# Patient Record
Sex: Male | Born: 1940 | Race: Black or African American | Hispanic: No
Health system: Midwestern US, Academic
[De-identification: ages and names within clinical notes are randomized; demographics above are authoritative.]

## PROBLEM LIST (undated history)

## (undated) DIAGNOSIS — Q231 Congenital insufficiency of aortic valve: Secondary | ICD-10-CM

## (undated) DIAGNOSIS — Z972 Presence of dental prosthetic device (complete) (partial): Secondary | ICD-10-CM

## (undated) DIAGNOSIS — C3492 Malignant neoplasm of unspecified part of left bronchus or lung: Secondary | ICD-10-CM

## (undated) DIAGNOSIS — R011 Cardiac murmur, unspecified: Secondary | ICD-10-CM

## (undated) DIAGNOSIS — E785 Hyperlipidemia, unspecified: Secondary | ICD-10-CM

## (undated) DIAGNOSIS — K219 Gastro-esophageal reflux disease without esophagitis: Secondary | ICD-10-CM

## (undated) DIAGNOSIS — D649 Anemia, unspecified: Secondary | ICD-10-CM

## (undated) DIAGNOSIS — Q230 Congenital stenosis of aortic valve: Secondary | ICD-10-CM

## (undated) DIAGNOSIS — H409 Unspecified glaucoma: Secondary | ICD-10-CM

## (undated) DIAGNOSIS — K922 Gastrointestinal hemorrhage, unspecified: Secondary | ICD-10-CM

## (undated) DIAGNOSIS — I10 Essential (primary) hypertension: Secondary | ICD-10-CM

## (undated) DIAGNOSIS — I509 Heart failure, unspecified: Secondary | ICD-10-CM

## (undated) DIAGNOSIS — H269 Unspecified cataract: Secondary | ICD-10-CM

## (undated) DIAGNOSIS — Z8679 Personal history of other diseases of the circulatory system: Secondary | ICD-10-CM

## (undated) DIAGNOSIS — K08109 Complete loss of teeth, unspecified cause, unspecified class: Secondary | ICD-10-CM

## (undated) DIAGNOSIS — I4510 Unspecified right bundle-branch block: Secondary | ICD-10-CM

## (undated) DIAGNOSIS — D573 Sickle-cell trait: Secondary | ICD-10-CM

## (undated) DIAGNOSIS — R972 Elevated prostate specific antigen [PSA]: Secondary | ICD-10-CM

## (undated) HISTORY — DX: Essential (primary) hypertension: I10

## (undated) HISTORY — DX: Hyperlipidemia, unspecified: E78.5

## (undated) HISTORY — PX: PROSTATE BIOPSY: SHX241

## (undated) HISTORY — DX: Unspecified cataract: H26.9

## (undated) HISTORY — PX: OTHER SURGICAL HISTORY: SHX169

## (undated) HISTORY — DX: Complete loss of teeth, unspecified cause, unspecified class: K08.109

## (undated) HISTORY — DX: Malignant neoplasm of unspecified part of left bronchus or lung: C34.92

## (undated) HISTORY — DX: Presence of dental prosthetic device (complete) (partial): Z97.2

## (undated) HISTORY — DX: Personal history of other diseases of the circulatory system: Z86.79

## (undated) HISTORY — PX: CATARACT EXTRACTION: SUR2

## (undated) HISTORY — PX: TONSILLECTOMY: SUR1361

## (undated) HISTORY — DX: Anemia, unspecified: D64.9

## (undated) HISTORY — DX: Sickle-cell trait: D57.3

## (undated) HISTORY — DX: Congenital insufficiency of aortic valve: Q23.1

## (undated) HISTORY — DX: Gastrointestinal hemorrhage, unspecified: K92.2

## (undated) HISTORY — DX: Unspecified glaucoma: H40.9

## (undated) HISTORY — DX: Congenital stenosis of aortic valve: Q23.0

## (undated) HISTORY — DX: Unspecified right bundle-branch block: I45.10

## (undated) HISTORY — DX: Cardiac murmur, unspecified: R01.1

## (undated) HISTORY — DX: Elevated prostate specific antigen (PSA): R97.20

## (undated) LAB — PREPARE RBC, LEUKOREDUCED
Blood Expiration Date: 202408132359
Dispense Status: TRANSFUSED

---

## 2001-04-28 ENCOUNTER — Emergency Department (HOSPITAL_COMMUNITY): Admission: EM | Admit: 2001-04-28 | Discharge: 2001-04-28 | Payer: Self-pay | Admitting: Emergency Medicine

## 2001-04-28 ENCOUNTER — Encounter: Payer: Self-pay | Admitting: Emergency Medicine

## 2001-05-12 ENCOUNTER — Ambulatory Visit (HOSPITAL_COMMUNITY): Admission: RE | Admit: 2001-05-12 | Discharge: 2001-05-12 | Payer: Self-pay | Admitting: Family Medicine

## 2002-10-29 ENCOUNTER — Encounter: Admission: RE | Admit: 2002-10-29 | Discharge: 2002-10-29 | Payer: Self-pay | Admitting: Otolaryngology

## 2002-10-29 ENCOUNTER — Encounter: Payer: Self-pay | Admitting: Otolaryngology

## 2002-10-30 ENCOUNTER — Encounter (INDEPENDENT_AMBULATORY_CARE_PROVIDER_SITE_OTHER): Payer: Self-pay | Admitting: Otolaryngology

## 2002-10-30 ENCOUNTER — Ambulatory Visit (HOSPITAL_COMMUNITY): Admission: RE | Admit: 2002-10-30 | Discharge: 2002-10-30 | Payer: Self-pay | Admitting: Otolaryngology

## 2002-10-30 ENCOUNTER — Ambulatory Visit (HOSPITAL_BASED_OUTPATIENT_CLINIC_OR_DEPARTMENT_OTHER): Admission: RE | Admit: 2002-10-30 | Discharge: 2002-10-30 | Payer: Self-pay | Admitting: Otolaryngology

## 2004-10-08 ENCOUNTER — Ambulatory Visit: Payer: Self-pay | Admitting: Internal Medicine

## 2004-10-20 ENCOUNTER — Encounter (INDEPENDENT_AMBULATORY_CARE_PROVIDER_SITE_OTHER): Payer: Self-pay | Admitting: Specialist

## 2004-10-20 ENCOUNTER — Ambulatory Visit: Payer: Self-pay | Admitting: Internal Medicine

## 2007-10-30 ENCOUNTER — Ambulatory Visit (HOSPITAL_COMMUNITY): Admission: RE | Admit: 2007-10-30 | Discharge: 2007-10-30 | Payer: Self-pay | Admitting: Family Medicine

## 2008-04-29 ENCOUNTER — Encounter (INDEPENDENT_AMBULATORY_CARE_PROVIDER_SITE_OTHER): Payer: Self-pay | Admitting: *Deleted

## 2009-11-04 ENCOUNTER — Encounter: Payer: Self-pay | Admitting: Internal Medicine

## 2009-11-18 ENCOUNTER — Telehealth: Payer: Self-pay | Admitting: Internal Medicine

## 2009-11-18 HISTORY — PX: COLONOSCOPY: SHX174

## 2009-11-19 ENCOUNTER — Encounter (INDEPENDENT_AMBULATORY_CARE_PROVIDER_SITE_OTHER): Payer: Self-pay | Admitting: *Deleted

## 2009-11-19 ENCOUNTER — Encounter: Payer: Self-pay | Admitting: Internal Medicine

## 2009-11-20 ENCOUNTER — Ambulatory Visit: Payer: Self-pay | Admitting: Internal Medicine

## 2009-11-27 ENCOUNTER — Ambulatory Visit: Payer: Self-pay | Admitting: Internal Medicine

## 2009-12-01 ENCOUNTER — Encounter: Payer: Self-pay | Admitting: Internal Medicine

## 2010-02-19 NOTE — Procedures (Signed)
Summary: Colonoscopy  Patient: [name] Note: All result statuses are Final unless otherwise noted.  Tests: (1) Colonoscopy (COL)   COL Colonoscopy           [facility]     [place], [place]  [address]           COLONOSCOPY PROCEDURE REPORT           PATIENT:  Andrew Guerra, Andrew Guerra  MR#:  [id number]     BIRTHDATE:  February 10, 1940, 69 yrs. old  GENDER:  male     ENDOSCOPIST:  Docia Chuck. Geri Seminole, MD     REF. 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  Digital rectal exam was performed and     revealed no abnormalities.   The LB 180AL [id number] endoscope was     introduced through the anus and advanced to the cecum, which was     identified by both the appendix and ileocecal valve, without     limitations.Time to the cecum = 3:38 min. The quality of the prep     was excellent, using MoviPrep.  The instrument was then slowly     withdrawn (time = 11:18 min) as the colon was fully examined.     <<PROCEDUREIMAGES>>           FINDINGS:  A diminutive polyp was found in the proximal transverse     colon. Polyp was snared without cautery. Retrieval was successful.     snare polyp  This was otherwise a normal examination of the colon.     Retroflexed views in the rectum revealed hypertrophied anal     papillae.    The scope was then withdrawn from the patient and the     procedure completed.           COMPLICATIONS:  None     ENDOSCOPIC IMPRESSION:     1) Diminutive polyp in the proximal transverse colon     2) Otherwise normal examination    3) Hypertrophied anal papillae           RECOMMENDATIONS:     1) Repeat colonoscopy in 5 years if polyp adenomatous; otherwise     10 years           ______________________________     Docia Chuck. Geri Seminole, MD           CC:  Sallee Lange, MD; The Patient           n.     eSIGNED:   Docia Chuck. Geri Seminole at 11/27/2009 01:06 PM           Andrew Guerra, [id number]  Note: An exclamation mark (!) indicates a result that was not dispersed into the flowsheet. Document Creation Date: 11/27/2009 1:07 PM _______________________________________________________________________  (1) Order result status: Final Collection  or observation date-time: 11/27/2009 13:00 Requested date-time:  Receipt date-time:  Reported date-time:  Referring Physician:   Ordering Physician: Lavena Bullion [phone]) Specimen Source:  Source: Tawanna Cooler Order Number: [id number] Lab site:   Appended Document: Colonoscopy     Procedures Next Due Date:    Colonoscopy: 11/2014

## 2010-02-19 NOTE — Letter (Signed)
Summary: [facility] Instructions  [facility]  [place], [place] [address]   Phone: [phone]  Fax: [phone]       PILAR DERITA    1940/11/27    MRN: [id number]        Procedure Day Sudie Grumbling:  Raquel Sarna  72/10/11     Arrival Time:  10:30AM     Procedure Time:  11:30AM     Location of Procedure:                    _X _  [facility] (4th Floor)                     [place]   Starting 5 days prior to your procedure 11/22/09 do not eat nuts, seeds, popcorn, corn, beans, peas,  salads, or any raw vegetables.  Do not take any fiber supplements (e.g. Metamucil, Citrucel, and Benefiber).  THE DAY BEFORE YOUR PROCEDURE         DATE: 72/9/11  DAY: WEDNESDAY  1.  Drink clear liquids the entire day-NO SOLID FOOD  2.  Do not drink anything colored red or purple.  Avoid juices with pulp.  No orange juice.  3.  Drink at least 64 oz. (8 glasses) of fluid/clear liquids during the day to prevent dehydration and help the prep work efficiently.  CLEAR LIQUIDS INCLUDE: Water Jello Ice Popsicles Tea (sugar ok, no milk/cream) Powdered fruit flavored drinks Coffee (sugar ok, no milk/cream) Gatorade Juice: apple, white grape, white cranberry  Lemonade Clear bullion, consomm, broth Carbonated beverages (any kind) Strained chicken noodle soup Hard Candy                             4.  In the morning, mix first dose of MoviPrep solution:    Empty 1 Pouch A and 1 Pouch B into the disposable container    Add lukewarm drinking water to the top line of the container. Mix to dissolve    Refrigerate (mixed solution should be used within 24 hrs)  5.  Begin drinking the prep at 5:00 p.m. The MoviPrep container is divided by 4 marks.   Every 15 minutes drink the solution down to the next mark (approximately 8 oz) until the full liter is complete.   6.  Follow completed prep with 16 oz of clear liquid of your choice  (Nothing red or purple).  Continue to drink clear liquids until bedtime.  7.  Before going to bed, mix second dose of MoviPrep solution:    Empty 1 Pouch A and 1 Pouch B into the disposable container    Add lukewarm drinking water to the top line of the container. Mix to dissolve    Refrigerate  THE DAY OF YOUR PROCEDURE      DATE: 72/10/11   DAY: THURSDAY  Beginning at 6:30AM (5 hours before procedure):         1. Every 15 minutes, drink the solution down to the next mark (approx 8 oz) until the full liter is complete.  2. Follow completed prep with 16 oz. of clear liquid of your choice.    3. You may drink clear liquids until 9:30AM (2 HOURS BEFORE PROCEDURE).   MEDICATION INSTRUCTIONS  Unless otherwise instructed, you should take regular prescription medications with a small sip of water   as early as possible the morning of your  procedure.         OTHER INSTRUCTIONS  You will need a responsible adult at least 72 years of age to accompany you and drive you home.   This person must remain in the waiting room during your procedure.  Wear loose fitting clothing that is easily removed.  Leave jewelry and other valuables at home.  However, you may wish to bring a book to read or  an iPod/MP3 player to listen to music as you wait for your procedure to start.  Remove all body piercing jewelry and leave at home.  Total time from sign-in until discharge is approximately 2-3 hours.  You should go home directly after your procedure and rest.  You can resume normal activities the  day after your procedure.  The day of your procedure you should not:   Drive   Make legal decisions   Operate machinery   Drink alcohol   Return to work  You will receive specific instructions about eating, activities and medications before you leave.    The above instructions have been reviewed and explained to me by   Ernestine Conrad, RN______________________    I fully  understand and can verbalize these instructions _____________________________ Date _________

## 2010-02-19 NOTE — Letter (Signed)
Summary: Patient Notice- Polyp Results  [facility]  [address] [place], [place] [address]   Phone: [phone]  Fax: [phone]        December 01, 2009 MRN: [id number]    Andrew Guerra [address] [place], [place]  [address]    Dear [name],  I am pleased to inform you that the colon polyp(s) removed during your recent colonoscopy was (were) found to be benign (no cancer detected) upon pathologic examination.  I recommend you have a repeat colonoscopy examination in 5 years to look for recurrent polyps, as having colon polyps increases your risk for having recurrent polyps or even colon cancer in the future.  Should you develop new or worsening symptoms of abdominal pain, bowel habit changes or bleeding from the rectum or bowels, please schedule an evaluation with either your primary care physician or with me.  Additional information/recommendations:  __ No further action with gastroenterology is needed at this time. Please      follow-up with your primary care physician for your other healthcare      needs.    Please call us if you are having persistent problems or have questions about your condition that have not been fully answered at this time.  Sincerely,  Irene Shipper MD  This letter has been electronically signed by your physician.  Appended Document: Patient Notice- Polyp Results letter mailed

## 2010-02-19 NOTE — Progress Notes (Signed)
  Phone Note Outgoing Call   Call placed by: Randye Lobo NCMA,  November 18, 2009 11:01 AM Call placed to: Patient Summary of Call: Called patient to make Colonoscopy appt.  Left msg. on voicemail for patient to call back and schedule.   Initial call taken by: Randye Lobo NCMA,  November 18, 2009 11:02 AM  Follow-up for Phone Call        called patient and set up recall colonoscopy for 11/27/09 and pre-visit 11/20/09  1:00.   Follow-up by: Randye Lobo NCMA,  November 19, 2009 2:14 PM

## 2010-02-19 NOTE — Miscellaneous (Signed)
Summary: recall colonoscopy 11/27/09  11:30 am/bs  Clinical Lists Changes  Medications: Added new medication of MOVIPREP 100 GM  SOLR (PEG-KCL-NACL-NASULF-NA ASC-C) As directed - Signed Rx of MOVIPREP 100 GM  SOLR (PEG-KCL-NACL-NASULF-NA ASC-C) As directed;  #1 x 0;  Signed;  Entered by: Ernestine Conrad RN;  Authorized by: Irene Shipper MD;  Method used: Electronically to [facility] [phone]*, [address], [place], [place]  [address], Ph: [phone] or [phone], Fax: [phone] Observations: Added new observation of ALLERGY REV: Done (11/20/2009 12:44)    Prescriptions: MOVIPREP 100 GM  SOLR (PEG-KCL-NACL-NASULF-NA ASC-C) As directed  #1 x 0   Entered by:   Ernestine Conrad RN   Authorized by:   Irene Shipper MD   Signed by:   Ernestine Conrad RN on 11/20/2009   Method used:   Electronically to        [facility] (retail)       [address]       [place], [place]  [address]       Ph: [phone] or [phone]       Fax: [phone]   RxID:   [id number]

## 2010-02-19 NOTE — Letter (Signed)
Summary: [facility]   Imported By: Bubba Hales 12/03/2009 10:11:35  _____________________________________________________________________  External Attachment:    Type:   Image     Comment:   External Document

## 2010-02-19 NOTE — Letter (Signed)
Summary: Pre Visit Letter Revised  [facility]  [place], [place] [address]   Phone: [phone]  Fax: [phone]        11/19/2009 MRN: [id number] Andrew Guerra [place], [place]  [address]             Procedure Date:  11/27/09  Welcome to the Gastroenterology Division at [facility].    You are scheduled to see a nurse for your pre-procedure visit on 11/20/09 at 1:00 pm on the 3rd floor at [facility], [place] [employer].  We ask that you try to arrive at our office 15 minutes prior to your appointment time to allow for check-in.  Please take a minute to review the attached form.  If you answer "Yes" to one or more of the questions on the first page, we ask that you call the person listed at your earliest opportunity.  If you answer "No" to all of the questions, please complete the rest of the form and bring it to your appointment.    Your nurse visit will consist of discussing your medical and surgical history, your immediate family medical history, and your medications.   If you are unable to list all of your medications on the form, please bring the medication bottles to your appointment and we will list them.  We will need to be aware of both prescribed and over the counter drugs.  We will need to know exact dosage information as well.    Please be prepared to read and sign documents such as consent forms, a financial agreement, and acknowledgement forms.  If necessary, and with your consent, a friend or relative is welcome to sit-in on the nurse visit with you.  Please bring your insurance card so that we may make a copy of it.  If your insurance requires a referral to see a specialist, please bring your referral form from your primary care physician.  No co-pay is required for this nurse visit.     If you cannot keep your appointment, please call [phone] to cancel or reschedule prior to your appointment date.  This allows [name]  the opportunity to schedule an appointment for another patient in need of care.    Thank you for choosing [facility] for your medical needs.  We appreciate the opportunity to care for you.  Please visit [name] at our website  to learn more about our practice.  Sincerely, The Gastroenterology Division

## 2010-06-05 NOTE — Op Note (Signed)
   NAME:  Andrew Guerra, Andrew Guerra NO.:  [id number]   MEDICAL RECORD NO.:  [id number]                   PATIENT TYPE:  AMB   LOCATION:  [facility]                                  FACILITY:  [facility]   PHYSICIAN:  Minna Merritts, M.D.                DATE OF BIRTH:  03-Feb-1940   DATE OF PROCEDURE:  10/30/2002  DATE OF DISCHARGE:                                 OPERATIVE REPORT   PREOPERATIVE DIAGNOSIS:  Rule out basal cell carcinoma with associated  sebaceous cyst of left lateral eyelid, lower.   POSTOPERATIVE DIAGNOSIS:  Rule out basal cell carcinoma with associated  sebaceous cyst of left lateral eyelid, lower.   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   SURGEON:  Minna Merritts, M.D.   ANESTHESIA:  MAC with local.  1% Xylocaine with epinephrine.   DESCRIPTION OF PROCEDURE:  The patient was placed in supine position and  after prepping and local anesthesia was instilled, the marking pen was used  and an elliptical incision was made and this area of concern on the skin was  completely removed and will be sent for pathology.  We then dissected a very  large sebaceous cyst from this area measuring approximately 2 x 1 cm in size  and involving the two areas of skin, one at the area of excision and one  inferior to this and this was completely excised and Bovie  electrocoagulation was used for hemostasis.  Once this was incised and all  hemostasis was controlled, the closure was with 5-0 plain catgut interrupted  stitches.  Steri-Strips were applied.  The patient tolerated the procedure  well and is doing well postoperatively.  Follow-up will be in 10 days, then  three weeks.                                                Minna Merritts, M.D.    JC/MEDQ  D:  10/30/2002  T:  10/30/2002  Job:  [id number]   cc:   Nicki Reaper A. Wolfgang Phoenix, M.D.  [address] [place]  [place]  [address]  Fax: [phone]

## 2010-06-05 NOTE — Procedures (Signed)
[facility]  Patient:    Andrew Guerra, Andrew Guerra Visit Number: [id number] MRN: [id number]          Service Type: OUT Location: RAD Attending Physician:  Sallee Lange Dictated by:   Sallee Lange, M.D. Admit Date:  05/12/2001                                Stress Test  PROTOCOL:  Bruce.  INDICATIONS:  Chest discomfort along with risk factors.  RESTING EKG:  No acute ST segment changes.  Normal sinus rhythm.  Baseline blood pressure mildly elevated 162/96.  HEART RATE RESPONSE TO EXERCISE:  Patient went into stage II approximately seven minutes when he had to stop because of shortness of breath.  SYMPTOMATOLOGY:  Just shortness of breath.  ST SEGMENT RESPONSE TO EXERCISE:  Patient did not have any appreciable ST segment depression or inversion.  RECOVERY PHASE:  Uneventful.  BLOOD PRESSURE RESPONSE TO EXERCISE:  Patient did have a hypertensive response to exercise.  INTERPRETATION:  Negative stress test.  Poor exercise tolerance.  Patient was encouraged to quit smoking.  Encouraged to gradually pick up a walking program.  We will plan on doing a pulmonary function test as part of his outpatient work-up when he comes in for his physical. Dictated by:   Sallee Lange, M.D. Attending Physician:  Sallee Lange DD:  05/12/01 TD:  05/12/01 Job: [id number] [id number]

## 2012-05-17 ENCOUNTER — Encounter: Payer: Self-pay | Admitting: *Deleted

## 2012-05-19 ENCOUNTER — Other Ambulatory Visit: Payer: Self-pay | Admitting: Urology

## 2012-05-19 DIAGNOSIS — R972 Elevated prostate specific antigen [PSA]: Secondary | ICD-10-CM

## 2012-05-30 ENCOUNTER — Other Ambulatory Visit: Payer: Self-pay | Admitting: Urology

## 2012-05-30 ENCOUNTER — Other Ambulatory Visit (HOSPITAL_COMMUNITY): Payer: Medicare Other

## 2012-05-30 ENCOUNTER — Ambulatory Visit (HOSPITAL_COMMUNITY)
Admission: RE | Admit: 2012-05-30 | Discharge: 2012-05-30 | Disposition: A | Discharge status: Home / Self Care | Payer: Medicare Other | Source: Ambulatory Visit | Attending: Urology | Admitting: Urology

## 2012-05-30 ENCOUNTER — Ambulatory Visit (HOSPITAL_COMMUNITY): Admission: RE | Admit: 2012-05-30 | Payer: Medicare Other | Source: Ambulatory Visit

## 2012-05-30 DIAGNOSIS — R972 Elevated prostate specific antigen [PSA]: Secondary | ICD-10-CM

## 2012-05-30 DIAGNOSIS — IMO0002 Reserved for concepts with insufficient information to code with codable children: Secondary | ICD-10-CM

## 2012-05-30 DIAGNOSIS — Z1389 Encounter for screening for other disorder: Secondary | ICD-10-CM | POA: Insufficient documentation

## 2012-05-30 LAB — CREATININE, SERUM
Creatinine, Ser: 1.3 mg/dL (ref 0.50–1.35)
GFR calc Af Amer: 61 mL/min — ABNORMAL LOW (ref >90)
GFR calc non Af Amer: 53 mL/min — ABNORMAL LOW (ref >90)

## 2012-05-30 MED ORDER — GADOBENATE DIMEGLUMINE 529 MG/ML IV SOLN: 20.0000 mL | Freq: Once | INTRAVENOUS | Status: DC | PRN

## 2012-05-30 MED ORDER — GADOBENATE DIMEGLUMINE 529 MG/ML IV SOLN
20.0000 mL | Freq: Once | INTRAVENOUS | Status: AC | PRN
  Administered 2012-05-30: 20 mL via INTRAVENOUS

## 2012-06-06 ENCOUNTER — Ambulatory Visit (INDEPENDENT_AMBULATORY_CARE_PROVIDER_SITE_OTHER): Payer: Medicare Other | Admitting: Family Medicine

## 2012-06-06 ENCOUNTER — Encounter: Payer: Self-pay | Admitting: Family Medicine

## 2012-06-06 VITALS — BP 138/82 | HR 70 | Wt 215.0 lb

## 2012-06-06 DIAGNOSIS — I10 Essential (primary) hypertension: Secondary | ICD-10-CM

## 2012-06-06 MED ORDER — HYDROCHLOROTHIAZIDE 25 MG PO TABS: 25.0000 mg | ORAL_TABLET | Freq: Every day | ORAL | Status: DC

## 2012-06-06 MED ORDER — METOPROLOL SUCCINATE ER 50 MG PO TB24: 50.0000 mg | ORAL_TABLET | Freq: Every day | ORAL | Status: DC

## 2012-06-06 MED ORDER — AMLODIPINE BESYLATE 10 MG PO TABS: 10.0000 mg | ORAL_TABLET | Freq: Every day | ORAL | Status: DC

## 2012-06-06 NOTE — Progress Notes (Signed)
  Subjective:    Patient ID: Andrew Guerra, male    DOB: 08/14/1940, 72 y.o.   MRN: [id number]  Hypertension This is a chronic problem. The current episode started more than 1 year ago. The problem is controlled.      Review of Systems     Objective:   Physical Exam 116/70       Assessment & Plan:  HTN PE fall

## 2012-06-08 DIAGNOSIS — I10 Essential (primary) hypertension: Secondary | ICD-10-CM | POA: Insufficient documentation

## 2012-06-08 HISTORY — DX: Essential (primary) hypertension: I10

## 2012-12-03 ENCOUNTER — Other Ambulatory Visit: Payer: Self-pay | Admitting: Family Medicine

## 2012-12-04 ENCOUNTER — Telehealth: Payer: Self-pay | Admitting: Family Medicine

## 2012-12-04 DIAGNOSIS — Z125 Encounter for screening for malignant neoplasm of prostate: Secondary | ICD-10-CM

## 2012-12-04 DIAGNOSIS — E782 Mixed hyperlipidemia: Secondary | ICD-10-CM

## 2012-12-04 DIAGNOSIS — Z79899 Other long term (current) drug therapy: Secondary | ICD-10-CM

## 2012-12-04 NOTE — Telephone Encounter (Signed)
Patient needs BW paperwork °

## 2012-12-04 NOTE — Telephone Encounter (Signed)
Blood work ordered in [employer]. Patient was notified.

## 2012-12-06 ENCOUNTER — Encounter: Payer: Self-pay | Admitting: Family Medicine

## 2012-12-06 LAB — HEPATIC FUNCTION PANEL
ALT: 17 U/L (ref 0–53)
AST: 25 U/L (ref 0–37)
Alkaline Phosphatase: 82 U/L (ref 39–117)
Bilirubin, Direct: 0.1 mg/dL (ref 0.0–0.3)
Indirect Bilirubin: 0.3 mg/dL (ref 0.0–0.9)
Total Bilirubin: 0.4 mg/dL (ref 0.3–1.2)

## 2012-12-06 LAB — BASIC METABOLIC PANEL
BUN: 21 mg/dL (ref 6–23)
CO2: 30 mEq/L (ref 19–32)
Calcium: 9.8 mg/dL (ref 8.4–10.5)
Glucose, Bld: 87 mg/dL (ref 70–99)
Sodium: 144 mEq/L (ref 135–145)

## 2012-12-06 LAB — LIPID PANEL
Cholesterol: 189 mg/dL (ref 0–200)
Total CHOL/HDL Ratio: 4.2 Ratio

## 2012-12-20 ENCOUNTER — Encounter: Payer: Self-pay | Admitting: Family Medicine

## 2012-12-20 ENCOUNTER — Ambulatory Visit (INDEPENDENT_AMBULATORY_CARE_PROVIDER_SITE_OTHER): Payer: Medicare Other | Admitting: Family Medicine

## 2012-12-20 VITALS — BP 120/82 | Ht 71.0 in | Wt 214.2 lb

## 2012-12-20 DIAGNOSIS — Z0000 Encounter for general adult medical examination without abnormal findings: Secondary | ICD-10-CM

## 2012-12-20 NOTE — Progress Notes (Signed)
   Subjective:    Patient ID: Andrew Guerra, male    DOB: Dec 26, 1940, 72 y.o.   MRN: [id number]  HPI  Patient arrives for annual wellness exam-no problems or concerns. Doing well Mini mental status-passed Patient reports no falls in the last 6 mths. AWV- Annual Wellness Visit  The patient was seen for their annual wellness visit. The patient's past medical history, surgical history, and family history were reviewed. Pertinent vaccines were reviewed ( tetanus, pneumonia, shingles, flu) The patient's medication list was reviewed and updated. The height and weight were entered. The patient's current BMI is:  Cognitive screening was completed. Outcome of Mini - [id number]  Falls within the past 6 months:none Current tobacco usage:former smoker (All patients who use tobacco were given written and verbal information on quitting)  Recent listing of emergency department/hospitalizations over the past year were reviewed.  current specialist the patient sees on a regular basis: [facility]  Medicare annual wellness visit patient questionnaire was reviewed.  A written screening schedule for the patient for the next 5-10 years was given. Appropriate discussion of followup regarding next visit was discussed.  Due colonoscopy 2016 Henrene Pastor)  30 minutes spent with the patient discussing safety dietary measures and lifestyle.  Review of Systems  Constitutional: Negative for fever, activity change and appetite change.  HENT: Negative for congestion and rhinorrhea.   Eyes: Negative for discharge.  Respiratory: Negative for cough and wheezing.   Cardiovascular: Negative for chest pain.  Gastrointestinal: Negative for vomiting, abdominal pain and blood in stool.  Genitourinary: Negative for frequency and difficulty urinating.  Musculoskeletal: Negative for neck pain.  Skin: Negative for rash.  Allergic/Immunologic: Negative for environmental allergies and food allergies.    Neurological: Negative for weakness and headaches.  Psychiatric/Behavioral: Negative for agitation.       Objective:   Physical Exam  Constitutional: He appears well-developed and well-nourished.  HENT:  Head: Normocephalic and atraumatic.  Right Ear: External ear normal.  Left Ear: External ear normal.  Nose: Nose normal.  Mouth/Throat: Oropharynx is clear and moist.  Eyes: EOM are normal. Pupils are equal, round, and reactive to light.  Neck: Normal range of motion. Neck supple. No thyromegaly present.  Cardiovascular: Normal rate, regular rhythm and normal heart sounds.   No murmur heard. Pulmonary/Chest: Effort normal and breath sounds normal. No respiratory distress. He has no wheezes.  Abdominal: Soft. Bowel sounds are normal. He exhibits no distension and no mass. There is no tenderness.  Musculoskeletal: Normal range of motion. He exhibits no edema.  Lymphadenopathy:    He has no cervical adenopathy.  Neurological: He is alert. He exhibits normal muscle tone.  Skin: Skin is warm and dry. No erythema.  Psychiatric: He has a normal mood and affect. His behavior is normal. Judgment normal.          Assessment & Plan:  #1 well does continue current measures will need colonoscopy coming up in 2016 #2 flu shot offered patient defers #3 blood pressure good control monitor creatinine recheck cholesterol as well as metabolic 7 in approximately 4-6 months time followup at that time. May need to start on a statin. May end up needing to have a 24-hour urine for creatinine and protein.

## 2012-12-20 NOTE — Patient Instructions (Addendum)
   No protein bars, start 81 mg aspirin daily  Lab work and follow up visit in April or May      DASH Diet The DASH diet stands for "Dietary Approaches to Stop Hypertension." It is a healthy eating plan that has been shown to reduce high blood pressure (hypertension) in as little as 14 days, while also possibly providing other significant health benefits. These other health benefits include reducing the risk of breast cancer after menopause and reducing the risk of type 2 diabetes, heart disease, colon cancer, and stroke. Health benefits also include weight loss and slowing kidney failure in patients with chronic kidney disease.  DIET GUIDELINES  Limit salt (sodium). Your diet should contain less than 1500 mg of sodium daily.  Limit refined or processed carbohydrates. Your diet should include mostly whole grains. Desserts and added sugars should be used sparingly.  Include small amounts of heart-healthy fats. These types of fats include nuts, oils, and tub margarine. Limit saturated and trans fats. These fats have been shown to be harmful in the body. CHOOSING FOODS  The following food groups are based on a 2000 calorie diet. See your Registered Dietitian for individual calorie needs. Grains and Grain Products (6 to 8 servings daily)  Eat More Often: Whole-wheat bread, brown rice, whole-grain or wheat pasta, quinoa, popcorn without added fat or salt (air popped).  Eat Less Often: White bread, white pasta, white rice, cornbread. Vegetables (4 to 5 servings daily)  Eat More Often: Fresh, frozen, and canned vegetables. Vegetables may be raw, steamed, roasted, or grilled with a minimal amount of fat.  Eat Less Often/Avoid: Creamed or fried vegetables. Vegetables in a cheese sauce. Fruit (4 to 5 servings daily)  Eat More Often: All fresh, canned (in natural juice), or frozen fruits. Dried fruits without added sugar. One hundred percent fruit juice ( cup [237 mL] daily).  Eat Less Often:  Dried fruits with added sugar. Canned fruit in light or heavy syrup. [employer], Fish, and Poultry (2 servings or less daily. One serving is 3 to 4 oz [85-114 g]).  Eat More Often: Ninety percent or leaner ground beef, tenderloin, sirloin. Round cuts of beef, chicken breast, [place] breast. All fish. Grill, bake, or broil your meat. Nothing should be fried.  Eat Less Often/Avoid: Fatty cuts of meat, [place], or chicken leg, thigh, or wing. Fried cuts of meat or fish. Dairy (2 to 3 servings)  Eat More Often: Low-fat or fat-free milk, low-fat plain or light yogurt, reduced-fat or part-skim cheese.  Eat Less Often/Avoid: Milk (whole, 2%).Whole milk yogurt. Full-fat cheeses. Nuts, Seeds, and Legumes (4 to 5 servings per week)  Eat More Often: All without added salt.  Eat Less Often/Avoid: Salted nuts and seeds, canned beans with added salt. Fats and Sweets (limited)  Eat More Often: Vegetable oils, tub margarines without trans fats, sugar-free gelatin. Mayonnaise and salad dressings.  Eat Less Often/Avoid: Coconut oils, palm oils, butter, stick margarine, cream, half and half, cookies, candy, pie. FOR MORE INFORMATION The Dash Diet Eating Plan: www.dashdiet.org Document Released: 12/24/2010 Document Revised: 03/29/2011 Document Reviewed: 12/24/2010 [facility] Patient Information 2014 [place], [place].

## 2013-02-28 ENCOUNTER — Other Ambulatory Visit: Payer: Self-pay | Admitting: Family Medicine

## 2013-04-09 ENCOUNTER — Telehealth: Payer: Self-pay | Admitting: Family Medicine

## 2013-04-09 DIAGNOSIS — I10 Essential (primary) hypertension: Secondary | ICD-10-CM

## 2013-04-09 DIAGNOSIS — Z79899 Other long term (current) drug therapy: Secondary | ICD-10-CM

## 2013-04-09 NOTE — Telephone Encounter (Signed)
Left message notifying pt orders are ready just go to lab fasting to do bloodwork.

## 2013-04-09 NOTE — Telephone Encounter (Signed)
Met 7 and lipid 

## 2013-04-09 NOTE — Telephone Encounter (Signed)
Pt had lipid, liver, BMP in November. Does he just need lipid and BMP now

## 2013-04-09 NOTE — Telephone Encounter (Signed)
Patient needs order for blood work. °

## 2013-04-10 LAB — HEPATIC FUNCTION PANEL
ALBUMIN: 4.2 g/dL (ref 3.5–5.2)
ALK PHOS: 76 U/L (ref 39–117)
ALT: 16 U/L (ref 0–53)
AST: 20 U/L (ref 0–37)
Bilirubin, Direct: 0.1 mg/dL (ref 0.0–0.3)
Indirect Bilirubin: 0.3 mg/dL (ref 0.2–1.2)
TOTAL PROTEIN: 6.5 g/dL (ref 6.0–8.3)
Total Bilirubin: 0.4 mg/dL (ref 0.2–1.2)

## 2013-04-10 LAB — BASIC METABOLIC PANEL
BUN: 19 mg/dL (ref 6–23)
CALCIUM: 9.6 mg/dL (ref 8.4–10.5)
CHLORIDE: 103 meq/L (ref 96–112)
CO2: 29 mEq/L (ref 19–32)
CREATININE: 1.33 mg/dL (ref 0.50–1.35)
Glucose, Bld: 95 mg/dL (ref 70–99)
Potassium: 4 mEq/L (ref 3.5–5.3)
Sodium: 141 mEq/L (ref 135–145)

## 2013-04-10 LAB — LIPID PANEL
CHOLESTEROL: 160 mg/dL (ref 0–200)
HDL: 43 mg/dL (ref >39)
LDL CALC: 101 mg/dL — AB (ref 0–99)
TRIGLYCERIDES: 82 mg/dL (ref <150)
Total CHOL/HDL Ratio: 3.7 Ratio
VLDL: 16 mg/dL (ref 0–40)

## 2013-04-25 ENCOUNTER — Ambulatory Visit (INDEPENDENT_AMBULATORY_CARE_PROVIDER_SITE_OTHER): Payer: Medicare Other | Admitting: Family Medicine

## 2013-04-25 ENCOUNTER — Encounter: Payer: Self-pay | Admitting: Family Medicine

## 2013-04-25 VITALS — BP 138/82 | Ht 71.0 in | Wt 205.0 lb

## 2013-04-25 DIAGNOSIS — E785 Hyperlipidemia, unspecified: Secondary | ICD-10-CM

## 2013-04-25 DIAGNOSIS — I10 Essential (primary) hypertension: Secondary | ICD-10-CM

## 2013-04-25 HISTORY — DX: Hyperlipidemia, unspecified: E78.5

## 2013-04-25 NOTE — Progress Notes (Signed)
   Subjective:    Patient ID: Andrew Guerra, male    DOB: 07-09-1940, 73 y.o.   MRN: [id number]  HPI Patient is here today for a f/u on his BW he had done 3/23. Long discussion held regarding his cholesterol actually looks better now he is brought this under better control. He is trying to stay physically active. His recent lab work was reviewed with him in detail. (He does have some concerns that he would like to only discuss with you) We had a long discussion regarding hematuria kidney stone workup with urologist some confusing results that he didn't understand CT was told to him and they recommended MRI he wasn't sure why he will have the results sent to [name]. He states his PSAs have been coming down and looking good and they did a biopsy last year which was negative.   Review of Systems He denies any hematuria denies rectal bleeding denies chest pain shortness breath abdominal pain denies nausea vomiting diarrhea. Denies fever sweats chills.    Objective:   Physical Exam His lungs are clear his hearts regular abdomen soft extremities no edema. Pulses are good. Blood pressure recheck is good. Neck no masses.       Assessment & Plan:  #1 there is a history of hematuria for which he saw the urologist it was evaluated thought to be a kidney stone they recommended a CT scan and MRI he did a CT scan they called him told him to do MRI he did not want to do the MRI he is having the results sent here for [name] to look at then we'll discuss with the patient my impression of their findings. Patient will call us in 7-10 days if he has not heard anything from [name].  #2 HTN good control continue current medications watch diet avoid excessive salt excessive proteins regular exercise is recommended  Patient will be due colonoscopy in 2016.  Cholesterol actually much better than what was he is continue his diet watch things hard recheck again in 6 months time

## 2013-05-10 ENCOUNTER — Telehealth: Payer: Self-pay | Admitting: Family Medicine

## 2013-05-10 NOTE — Telephone Encounter (Signed)
I spoke with the patient regarding his recent visit with [facility]. The testing he had done there indicated that it is important for him to have a MRI of the kidney as well as needing to have a CT scan of the chest. The purpose of this is to look at pulmonary nodules as well as make sure that he isn't developing kidney cancer and this was told to him that it's very important to get this done at the importance of this minimizes the risk of having undiagnosed cancer. He understands this he will consider it and discuss it further with [facility]. I told him he could call back the more if he would like.

## 2013-05-15 ENCOUNTER — Other Ambulatory Visit: Payer: Self-pay | Admitting: Urology

## 2013-05-15 DIAGNOSIS — C801 Malignant (primary) neoplasm, unspecified: Secondary | ICD-10-CM

## 2013-05-15 DIAGNOSIS — N289 Disorder of kidney and ureter, unspecified: Secondary | ICD-10-CM

## 2013-05-23 ENCOUNTER — Ambulatory Visit (HOSPITAL_COMMUNITY)
Admission: RE | Admit: 2013-05-23 | Discharge: 2013-05-23 | Disposition: A | Discharge status: Home / Self Care | Payer: Medicare Other | Source: Ambulatory Visit | Attending: Urology | Admitting: Urology

## 2013-05-23 DIAGNOSIS — C801 Malignant (primary) neoplasm, unspecified: Secondary | ICD-10-CM

## 2013-05-23 DIAGNOSIS — N289 Disorder of kidney and ureter, unspecified: Secondary | ICD-10-CM

## 2013-05-23 DIAGNOSIS — N281 Cyst of kidney, acquired: Secondary | ICD-10-CM | POA: Insufficient documentation

## 2013-05-23 DIAGNOSIS — Z8546 Personal history of malignant neoplasm of prostate: Secondary | ICD-10-CM | POA: Insufficient documentation

## 2013-05-23 MED ORDER — GADOBENATE DIMEGLUMINE 529 MG/ML IV SOLN
20.0000 mL | Freq: Once | INTRAVENOUS | Status: AC | PRN
  Administered 2013-05-23: 20 mL via INTRAVENOUS

## 2013-06-16 ENCOUNTER — Other Ambulatory Visit: Payer: Self-pay | Admitting: Family Medicine

## 2013-08-09 ENCOUNTER — Telehealth: Payer: Self-pay | Admitting: Family Medicine

## 2013-08-09 NOTE — Telephone Encounter (Signed)
I called the patient. I spoke with his wife. She states he is currently in no hiatal do to a cousin's stroke. He is scheduled to come back Friday. Please call Andrew Guerra early next week to schedule an appointment for him to come see me in August. Preferably within the next 3 weeks.

## 2013-08-10 NOTE — Telephone Encounter (Signed)
Pt will follow up to discuss his MRI, he was spoken with , appt scheduled ( he was told of kidney growth and need for removal)

## 2013-08-24 ENCOUNTER — Other Ambulatory Visit: Payer: Self-pay | Admitting: Family Medicine

## 2013-08-24 ENCOUNTER — Telehealth: Payer: Self-pay | Admitting: *Deleted

## 2013-08-24 NOTE — Telephone Encounter (Signed)
[facility]. Dr. Nicki Reaper wants MRI and CT report and Dr. Ralene Muskrat last office visit note. i called Dr. Ralene Muskrat office. They need pt to sign a release before sending over. Pt has appt Aug 14th. Dr. Nicki Reaper would like records before the appt. [facility] to get pt to come here and sign a release. Fax release to [phone].

## 2013-08-24 NOTE — Telephone Encounter (Signed)
Discussed with pt. He states he will come and sign release so we can get copies of scan and office note.

## 2013-08-31 ENCOUNTER — Encounter: Payer: Self-pay | Admitting: Family Medicine

## 2013-08-31 ENCOUNTER — Ambulatory Visit (INDEPENDENT_AMBULATORY_CARE_PROVIDER_SITE_OTHER): Payer: Medicare Other | Admitting: Family Medicine

## 2013-08-31 VITALS — BP 136/88 | Ht 71.0 in | Wt 204.0 lb

## 2013-08-31 DIAGNOSIS — E041 Nontoxic single thyroid nodule: Secondary | ICD-10-CM | POA: Insufficient documentation

## 2013-08-31 DIAGNOSIS — C641 Malignant neoplasm of right kidney, except renal pelvis: Secondary | ICD-10-CM

## 2013-08-31 DIAGNOSIS — R911 Solitary pulmonary nodule: Secondary | ICD-10-CM

## 2013-08-31 DIAGNOSIS — C649 Malignant neoplasm of unspecified kidney, except renal pelvis: Secondary | ICD-10-CM

## 2013-08-31 NOTE — Progress Notes (Signed)
   Subjective:    Patient ID: Andrew Guerra, male    DOB: 04/13/1940, 73 y.o.   MRN: [id number]  HPI Patient is here today to go over MRI results and CT scan.  Patient denies any weight loss denies any shortness of breath nausea vomiting diarrhea denies hematuria or rectal bleeding No concerns. MRI was reviewed with the patient CAT scan reviewed with patient [facility] notes reviewed with the patient Review of Systems See above    Objective:   Physical Exam Lungs clear heart regular abdomen soft       Assessment & Plan:  30 minutes was spent with this patient discussing the results of his MRI and CT exam.  The patient is very aware that in my opinion he has tumor of the kidney going on I recommend he see a urologist to have a resection patient is hesitant to do so currently he will think about it he requested I call and speak with the urologist. We will do so.  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  We will discuss with urology his case then speak with the patient

## 2013-09-05 ENCOUNTER — Telehealth: Payer: Self-pay | Admitting: Family Medicine

## 2013-09-05 DIAGNOSIS — C641 Malignant neoplasm of right kidney, except renal pelvis: Secondary | ICD-10-CM

## 2013-09-06 NOTE — Telephone Encounter (Signed)
I spoke with the patient at length about all of his issues. Currently right now he does not want to do a CAT scan of the chest the followup pulmonary nodule. He does not one to do a thyroid ultrasound. He does agree to see the urology specialist regarding the possibility of a cancer in his kidney. He states after that is taking care of he will address the other issues. We will go ahead and help set up for this referral.

## 2013-09-17 ENCOUNTER — Telehealth: Payer: Self-pay | Admitting: Family Medicine

## 2013-09-17 NOTE — Telephone Encounter (Signed)
Called pt, explained that referral's been sent, they will set up & notify me then I will notify pt, pt verbalized understanding

## 2013-09-17 NOTE — Telephone Encounter (Signed)
Checking on referral with Dr. Alinda Money.

## 2013-09-25 ENCOUNTER — Encounter: Payer: Self-pay | Admitting: Family Medicine

## 2013-11-07 ENCOUNTER — Encounter: Payer: Self-pay | Admitting: Family Medicine

## 2013-11-07 ENCOUNTER — Ambulatory Visit (INDEPENDENT_AMBULATORY_CARE_PROVIDER_SITE_OTHER): Payer: Medicare Other | Admitting: Family Medicine

## 2013-11-07 VITALS — BP 134/78 | Ht 71.0 in | Wt 206.0 lb

## 2013-11-07 DIAGNOSIS — I10 Essential (primary) hypertension: Secondary | ICD-10-CM

## 2013-11-07 DIAGNOSIS — E041 Nontoxic single thyroid nodule: Secondary | ICD-10-CM

## 2013-11-07 NOTE — Progress Notes (Signed)
   Subjective:    Patient ID: Andrew Guerra, male    DOB: Sep 04, 1940, 73 y.o.   MRN: [id number]  Hypertension This is a chronic problem. The current episode started more than 1 year ago. Pertinent negatives include no chest pain. There are no compliance problems.    Declines flu vaccine.  No concerns.  2025 minutes was spent in this patient discussing diet and compliance with medications walking for exercise. Time was also spent discussing with him the importance of following up with urology I also discussed with him the importance of getting this thyroid nodule looked into he has been hesitant to do so he also has a small pulmonary nodule that I would recommend a followup scan early next year he has been hesitant to do anything about this either  Review of Systems  Constitutional: Negative for activity change, appetite change and fatigue.  HENT: Negative for congestion.   Respiratory: Negative for cough.   Cardiovascular: Negative for chest pain.  Gastrointestinal: Negative for abdominal pain.  Endocrine: Negative for polydipsia and polyphagia.  Neurological: Negative for weakness.  Psychiatric/Behavioral: Negative for confusion.       Objective:   Physical Exam  Vitals reviewed. Constitutional: He appears well-nourished. No distress.  Cardiovascular: Normal rate, regular rhythm and normal heart sounds.   No murmur heard. Pulmonary/Chest: Effort normal and breath sounds normal. No respiratory distress.  Musculoskeletal: He exhibits no edema.  Lymphadenopathy:    He has no cervical adenopathy.  Neurological: He is alert.  Psychiatric: His behavior is normal.          Assessment & Plan:  Dr Harlow Asa- thyroid, we will set the patient up with general surgery. I believe you are to have a fine-needle aspiration of this in more than likely have to have it removed. The patient is very hesitant I told him I would suspect that I recommend that we set him up with the surgeon to have  been discussed with him in detail  Patient has a small pulmonary nodule we will try to convince him next year to do a followup scan of the chest to see if this has changed.  Renal mass-he is following up with [facility] early next year they will do another MRI if it starts showing changes am sure they will progress toward  Removal  Patient refuses flu vaccine today in a very kind way

## 2013-11-07 NOTE — Patient Instructions (Signed)

## 2013-12-14 ENCOUNTER — Other Ambulatory Visit: Payer: Self-pay | Admitting: Family Medicine

## 2013-12-31 ENCOUNTER — Other Ambulatory Visit (INDEPENDENT_AMBULATORY_CARE_PROVIDER_SITE_OTHER): Payer: Self-pay

## 2013-12-31 DIAGNOSIS — E042 Nontoxic multinodular goiter: Secondary | ICD-10-CM

## 2014-01-04 LAB — TSH: TSH: 2.416 u[IU]/mL (ref 0.350–4.500)

## 2014-01-23 ENCOUNTER — Ambulatory Visit
Admission: RE | Admit: 2014-01-23 | Discharge: 2014-01-23 | Disposition: A | Discharge status: Home / Self Care | Payer: Medicare Other | Source: Ambulatory Visit | Attending: Surgery | Admitting: Surgery

## 2014-01-23 ENCOUNTER — Other Ambulatory Visit (HOSPITAL_COMMUNITY)
Admission: RE | Admit: 2014-01-23 | Discharge: 2014-01-23 | Disposition: A | Discharge status: Home / Self Care | Payer: Medicare Other | Source: Ambulatory Visit | Attending: Interventional Radiology | Admitting: Interventional Radiology

## 2014-01-23 DIAGNOSIS — E079 Disorder of thyroid, unspecified: Secondary | ICD-10-CM | POA: Diagnosis present

## 2014-01-23 DIAGNOSIS — E042 Nontoxic multinodular goiter: Secondary | ICD-10-CM

## 2014-02-04 ENCOUNTER — Other Ambulatory Visit (INDEPENDENT_AMBULATORY_CARE_PROVIDER_SITE_OTHER): Payer: Self-pay | Admitting: Surgery

## 2014-02-04 DIAGNOSIS — E041 Nontoxic single thyroid nodule: Secondary | ICD-10-CM

## 2014-02-04 NOTE — Addendum Note (Signed)
Addended by: Earnstine Regal on: 02/04/2014 10:58 AM   Modules accepted: Orders

## 2014-02-17 ENCOUNTER — Other Ambulatory Visit: Payer: Self-pay | Admitting: Family Medicine

## 2014-03-25 ENCOUNTER — Telehealth: Payer: Self-pay | Admitting: Family Medicine

## 2014-03-25 NOTE — Telephone Encounter (Signed)
Please send him a card letting him know he is due for comprehensive lab work and office visit in April, and for him to schedule.

## 2014-04-02 ENCOUNTER — Telehealth: Payer: Self-pay | Admitting: Family Medicine

## 2014-04-02 DIAGNOSIS — E785 Hyperlipidemia, unspecified: Secondary | ICD-10-CM

## 2014-04-02 DIAGNOSIS — I10 Essential (primary) hypertension: Secondary | ICD-10-CM

## 2014-04-02 DIAGNOSIS — Z79899 Other long term (current) drug therapy: Secondary | ICD-10-CM

## 2014-04-02 NOTE — Telephone Encounter (Signed)
Lipid, liver, metabolic 7

## 2014-04-02 NOTE — Telephone Encounter (Signed)
Pt notified and verbalized understanding to go to [facility] and be fasting.

## 2014-04-02 NOTE — Telephone Encounter (Signed)
Patient need lab work done for 6 mth follow up in April.

## 2014-04-02 NOTE — Addendum Note (Signed)
Addended byCharolotte Capuchin D on: 04/02/2014 10:41 AM   Modules accepted: Orders

## 2014-04-02 NOTE — Telephone Encounter (Signed)
04/10/13: lip, liv, met7

## 2014-04-30 LAB — BASIC METABOLIC PANEL
BUN / CREAT RATIO: 15 (ref 10–22)
BUN: 22 mg/dL (ref 8–27)
CO2: 25 mmol/L (ref 18–29)
CREATININE: 1.47 mg/dL — AB (ref 0.76–1.27)
Calcium: 9.2 mg/dL (ref 8.6–10.2)
Chloride: 103 mmol/L (ref 97–108)
GFR, EST AFRICAN AMERICAN: 53 mL/min/{1.73_m2} — AB (ref >59)
GFR, EST NON AFRICAN AMERICAN: 46 mL/min/{1.73_m2} — AB (ref >59)
Glucose: 87 mg/dL (ref 65–99)
Potassium: 4.1 mmol/L (ref 3.5–5.2)
SODIUM: 142 mmol/L (ref 134–144)

## 2014-04-30 LAB — HEPATIC FUNCTION PANEL
ALBUMIN: 4.1 g/dL (ref 3.5–4.8)
ALT: 14 IU/L (ref 0–44)
AST: 20 IU/L (ref 0–40)
Alkaline Phosphatase: 87 IU/L (ref 39–117)
BILIRUBIN TOTAL: 0.4 mg/dL (ref 0.0–1.2)
Bilirubin, Direct: 0.12 mg/dL (ref 0.00–0.40)
TOTAL PROTEIN: 6.4 g/dL (ref 6.0–8.5)

## 2014-04-30 LAB — LIPID PANEL
CHOL/HDL RATIO: 3.2 ratio (ref 0.0–5.0)
CHOLESTEROL TOTAL: 158 mg/dL (ref 100–199)
HDL: 49 mg/dL (ref >39)
LDL Calculated: 95 mg/dL (ref 0–99)
TRIGLYCERIDES: 72 mg/dL (ref 0–149)
VLDL Cholesterol Cal: 14 mg/dL (ref 5–40)

## 2014-05-06 ENCOUNTER — Ambulatory Visit (INDEPENDENT_AMBULATORY_CARE_PROVIDER_SITE_OTHER): Payer: Medicare Other | Admitting: Family Medicine

## 2014-05-06 ENCOUNTER — Encounter: Payer: Self-pay | Admitting: Family Medicine

## 2014-05-06 VITALS — BP 124/86 | Ht 70.5 in | Wt 206.0 lb

## 2014-05-06 DIAGNOSIS — I10 Essential (primary) hypertension: Secondary | ICD-10-CM | POA: Diagnosis not present

## 2014-05-06 DIAGNOSIS — Z23 Encounter for immunization: Secondary | ICD-10-CM

## 2014-05-06 MED ORDER — METOPROLOL SUCCINATE ER 50 MG PO TB24: 50.0000 mg | ORAL_TABLET | Freq: Every day | ORAL | Status: DC

## 2014-05-06 MED ORDER — HYDROCHLOROTHIAZIDE 25 MG PO TABS
25.0000 mg | ORAL_TABLET | Freq: Every day | ORAL | Status: DC
Start: 2014-05-06 — End: 2014-11-05

## 2014-05-06 MED ORDER — AMLODIPINE BESYLATE 10 MG PO TABS: 10.0000 mg | ORAL_TABLET | Freq: Every day | ORAL | Status: DC

## 2014-05-06 NOTE — Progress Notes (Signed)
   Subjective:    Patient ID: Andrew Guerra, male    DOB: 1940-05-07, 74 y.o.   MRN: [id number]  Hypertension This is a chronic problem. The current episode started more than 1 year ago. Pertinent negatives include no chest pain. Compliance problems include exercise.   pt states he eats healthy. Little exercise right now.  \no concerns today.   Patient has history of benign thyroid nodule also has a history of pulmonary nodule  I recommended pneumonia vaccine for the patient today    Review of Systems  Constitutional: Negative for activity change, appetite change and fatigue.  HENT: Negative for congestion.   Respiratory: Negative for cough.   Cardiovascular: Negative for chest pain.  Gastrointestinal: Negative for abdominal pain.  Endocrine: Negative for polydipsia and polyphagia.  Neurological: Negative for weakness.  Psychiatric/Behavioral: Negative for confusion.       Objective:   Physical Exam  Constitutional: He appears well-nourished. No distress.  Cardiovascular: Normal rate, regular rhythm and normal heart sounds.   No murmur heard. Pulmonary/Chest: Effort normal and breath sounds normal. No respiratory distress.  Musculoskeletal: He exhibits no edema.  Lymphadenopathy:    He has no cervical adenopathy.  Neurological: He is alert.  Psychiatric: His behavior is normal.  Vitals reviewed.         Assessment & Plan:  HTN good control continue current measures watch diet closely stay physically active  History thyroid nodule I recommended even though his biopsy was negative to consider doing a ultrasound 1 year from now to make sure there is no significant change  Pulmonary nodule patient had CAT scan January. I would recommend a follow-up CAT scan in July. We will connect with the patient to make sure he is following through on this.

## 2014-05-06 NOTE — Progress Notes (Signed)
Ok noted  

## 2014-05-06 NOTE — Patient Instructions (Signed)
DASH Eating Plan °DASH stands for "Dietary Approaches to Stop Hypertension." The DASH eating plan is a healthy eating plan that has been shown to reduce high blood pressure (hypertension). Additional health benefits may include reducing the risk of type 2 diabetes mellitus, heart disease, and stroke. The DASH eating plan may also help with weight loss. °WHAT DO I NEED TO KNOW ABOUT THE DASH EATING PLAN? °For the DASH eating plan, you will follow these general guidelines: °· Choose foods with a percent daily value for sodium of less than 5% (as listed on the food label). °· Use salt-free seasonings or herbs instead of table salt or sea salt. °· Check with your health care provider or pharmacist before using salt substitutes. °· Eat lower-sodium products, often labeled as "lower sodium" or "no salt added." °· Eat fresh foods. °· Eat more vegetables, fruits, and low-fat dairy products. °· Choose whole grains. Look for the word "whole" as the first word in the ingredient list. °· Choose fish and skinless chicken or turkey more often than red meat. Limit fish, poultry, and meat to 6 oz (170 g) each day. °· Limit sweets, desserts, sugars, and sugary drinks. °· Choose heart-healthy fats. °· Limit cheese to 1 oz (28 g) per day. °· Eat more home-cooked food and less restaurant, buffet, and fast food. °· Limit fried foods. °· Cook foods using methods other than frying. °· Limit canned vegetables. If you do use them, rinse them well to decrease the sodium. °· When eating at a restaurant, ask that your food be prepared with less salt, or no salt if possible. °WHAT FOODS CAN I EAT? °Seek help from a dietitian for individual calorie needs. °Grains °Whole grain or whole wheat bread. Brown rice. Whole grain or whole wheat pasta. Quinoa, bulgur, and whole grain cereals. Low-sodium cereals. Corn or whole wheat flour tortillas. Whole grain cornbread. Whole grain crackers. Low-sodium crackers. °Vegetables °Fresh or frozen vegetables  (raw, steamed, roasted, or grilled). Low-sodium or reduced-sodium tomato and vegetable juices. Low-sodium or reduced-sodium tomato sauce and paste. Low-sodium or reduced-sodium canned vegetables.  °Fruits °All fresh, canned (in natural juice), or frozen fruits. °Meat and Other Protein Products °Ground beef (85% or leaner), grass-fed beef, or beef trimmed of fat. Skinless chicken or turkey. Ground chicken or turkey. Pork trimmed of fat. All fish and seafood. Eggs. Dried beans, peas, or lentils. Unsalted nuts and seeds. Unsalted canned beans. °Dairy °Low-fat dairy products, such as skim or 1% milk, 2% or reduced-fat cheeses, low-fat ricotta or cottage cheese, or plain low-fat yogurt. Low-sodium or reduced-sodium cheeses. °Fats and Oils °Tub margarines without trans fats. Light or reduced-fat mayonnaise and salad dressings (reduced sodium). Avocado. Safflower, olive, or canola oils. Natural peanut or almond butter. °Other °Unsalted popcorn and pretzels. °The items listed above may not be a complete list of recommended foods or beverages. Contact your dietitian for more options. °WHAT FOODS ARE NOT RECOMMENDED? °Grains °White bread. White pasta. White rice. Refined cornbread. Bagels and croissants. Crackers that contain trans fat. °Vegetables °Creamed or fried vegetables. Vegetables in a cheese sauce. Regular canned vegetables. Regular canned tomato sauce and paste. Regular tomato and vegetable juices. °Fruits °Dried fruits. Canned fruit in light or heavy syrup. Fruit juice. °Meat and Other Protein Products °Fatty cuts of meat. Ribs, chicken wings, bacon, sausage, bologna, salami, chitterlings, fatback, hot dogs, bratwurst, and packaged luncheon meats. Salted nuts and seeds. Canned beans with salt. °Dairy °Whole or 2% milk, cream, half-and-half, and cream cheese. Whole-fat or sweetened yogurt. Full-fat   cheeses or blue cheese. Nondairy creamers and whipped toppings. Processed cheese, cheese spreads, or cheese  curds. °Condiments °Onion and garlic salt, seasoned salt, table salt, and sea salt. Canned and packaged gravies. Worcestershire sauce. Tartar sauce. Barbecue sauce. Teriyaki sauce. Soy sauce, including reduced sodium. Steak sauce. Fish sauce. Oyster sauce. Cocktail sauce. Horseradish. Ketchup and mustard. Meat flavorings and tenderizers. Bouillon cubes. Hot sauce. Tabasco sauce. Marinades. Taco seasonings. Relishes. °Fats and Oils °Butter, stick margarine, lard, shortening, ghee, and bacon fat. Coconut, palm kernel, or palm oils. Regular salad dressings. °Other °Pickles and olives. Salted popcorn and pretzels. °The items listed above may not be a complete list of foods and beverages to avoid. Contact your dietitian for more information. °WHERE CAN I FIND MORE INFORMATION? °National Heart, Lung, and Blood Institute: www.nhlbi.nih.gov/health/health-topics/topics/dash/ °Document Released: 12/24/2010 Document Revised: 05/21/2013 Document Reviewed: 11/08/2012 °ExitCare® Patient Information ©2015 [employer]. This information is not intended to replace advice given to you by your health care provider. Make sure you discuss any questions you have with your health care provider. ° °

## 2014-05-17 ENCOUNTER — Other Ambulatory Visit: Payer: Self-pay | Admitting: Family Medicine

## 2014-05-23 ENCOUNTER — Other Ambulatory Visit: Payer: Self-pay | Admitting: Surgery

## 2014-05-23 DIAGNOSIS — E041 Nontoxic single thyroid nodule: Secondary | ICD-10-CM

## 2014-05-24 ENCOUNTER — Other Ambulatory Visit: Payer: Self-pay | Admitting: Surgery

## 2014-05-27 ENCOUNTER — Other Ambulatory Visit: Payer: Self-pay | Admitting: Surgery

## 2014-05-28 ENCOUNTER — Other Ambulatory Visit: Payer: Self-pay | Admitting: Surgery

## 2014-05-28 ENCOUNTER — Encounter: Payer: Self-pay | Admitting: Internal Medicine

## 2014-05-28 DIAGNOSIS — E041 Nontoxic single thyroid nodule: Secondary | ICD-10-CM

## 2014-06-19 ENCOUNTER — Other Ambulatory Visit: Payer: Self-pay | Admitting: Family Medicine

## 2014-07-10 ENCOUNTER — Ambulatory Visit
Admission: RE | Admit: 2014-07-10 | Discharge: 2014-07-10 | Disposition: A | Discharge status: Home / Self Care | Payer: Medicare Other | Source: Ambulatory Visit | Attending: Surgery | Admitting: Surgery

## 2014-11-05 ENCOUNTER — Encounter: Payer: Self-pay | Admitting: Family Medicine

## 2014-11-05 ENCOUNTER — Ambulatory Visit (INDEPENDENT_AMBULATORY_CARE_PROVIDER_SITE_OTHER): Payer: Medicare Other | Admitting: Family Medicine

## 2014-11-05 VITALS — BP 124/80 | Ht 70.5 in | Wt 207.0 lb

## 2014-11-05 DIAGNOSIS — I10 Essential (primary) hypertension: Secondary | ICD-10-CM

## 2014-11-05 DIAGNOSIS — E785 Hyperlipidemia, unspecified: Secondary | ICD-10-CM

## 2014-11-05 DIAGNOSIS — R911 Solitary pulmonary nodule: Secondary | ICD-10-CM | POA: Diagnosis not present

## 2014-11-05 MED ORDER — HYDROCHLOROTHIAZIDE 25 MG PO TABS: 25.0000 mg | ORAL_TABLET | Freq: Every day | ORAL | Status: DC

## 2014-11-05 MED ORDER — AMLODIPINE BESYLATE 10 MG PO TABS: 10.0000 mg | ORAL_TABLET | Freq: Every day | ORAL | Status: DC

## 2014-11-05 NOTE — Progress Notes (Signed)
   Subjective:    Patient ID: Andrew Guerra, male    DOB: 1940-11-21, 74 y.o.   MRN: [id number]  Hypertension This is a chronic problem. The current episode started more than 1 year ago. The problem has been gradually improving since onset. Pertinent negatives include no chest pain. There are no associated agents to hypertension. There are no known risk factors for coronary artery disease. Treatments tried: amlodipine. The current treatment provides moderate improvement. There are no compliance problems.    Patient states that he has no other concerns at this time.  PMH benign  Review of Systems  Constitutional: Negative for activity change, appetite change and fatigue.  HENT: Negative for congestion.   Respiratory: Negative for cough.   Cardiovascular: Negative for chest pain.  Gastrointestinal: Negative for abdominal pain.  Endocrine: Negative for polydipsia and polyphagia.  Neurological: Negative for weakness.  Psychiatric/Behavioral: Negative for confusion.       Objective:   Physical Exam  Constitutional: He appears well-nourished. No distress.  Cardiovascular: Normal rate, regular rhythm and normal heart sounds.   No murmur heard. Pulmonary/Chest: Effort normal and breath sounds normal. No respiratory distress.  Musculoskeletal: He exhibits no edema.  Lymphadenopathy:    He has no cervical adenopathy.  Neurological: He is alert.  Psychiatric: His behavior is normal.  Vitals reviewed.         Assessment & Plan:  Hypertension-blood pressure under good control. Watch diet check kidney function and urine ACR continue medication.  Hyperlipidemia-in recent past good continue current measures  Pulmonary nodule-patient states he is getting a repeat scan in January. It is important to have this sent to [name].

## 2014-11-06 LAB — BASIC METABOLIC PANEL
BUN/Creatinine Ratio: 14 (ref 10–22)
BUN: 19 mg/dL (ref 8–27)
CALCIUM: 9.8 mg/dL (ref 8.6–10.2)
CHLORIDE: 100 mmol/L (ref 97–106)
CO2: 27 mmol/L (ref 18–29)
Creatinine, Ser: 1.34 mg/dL — ABNORMAL HIGH (ref 0.76–1.27)
GFR, EST AFRICAN AMERICAN: 59 mL/min/{1.73_m2} — AB (ref >59)
GFR, EST NON AFRICAN AMERICAN: 51 mL/min/{1.73_m2} — AB (ref >59)
Glucose: 88 mg/dL (ref 65–99)
Potassium: 3.9 mmol/L (ref 3.5–5.2)
Sodium: 143 mmol/L (ref 136–144)

## 2014-11-06 LAB — MICROALBUMIN / CREATININE URINE RATIO
CREATININE, UR: 84 mg/dL
MICROALB/CREAT RATIO: <3.6 mg/g creat (ref 0.0–30.0)
Microalbumin, Urine: <3 ug/mL

## 2014-11-07 ENCOUNTER — Encounter: Payer: Self-pay | Admitting: Family Medicine

## 2014-12-02 ENCOUNTER — Encounter: Payer: Self-pay | Admitting: Internal Medicine

## 2014-12-17 ENCOUNTER — Other Ambulatory Visit: Payer: Self-pay | Admitting: Family Medicine

## 2015-01-27 ENCOUNTER — Encounter: Payer: Self-pay | Admitting: Internal Medicine

## 2015-03-11 ENCOUNTER — Ambulatory Visit (AMBULATORY_SURGERY_CENTER): Payer: Self-pay | Admitting: *Deleted

## 2015-03-11 VITALS — Ht 71.0 in | Wt 205.6 lb

## 2015-03-11 DIAGNOSIS — Z86010 Personal history of colonic polyps: Secondary | ICD-10-CM

## 2015-03-11 MED ORDER — SUPREP BOWEL PREP KIT 17.5-3.13-1.6 GM/177ML PO SOLN: 1.0000 | Freq: Once | ORAL | Status: DC

## 2015-03-11 NOTE — Progress Notes (Signed)
Patient denies any allergies to egg or soy products. Patient denies complications with anesthesia/sedation.  Patient denies oxygen use at home and denies diet medications. Emmi instructions for colonoscopy explained but patient denied.     

## 2015-03-25 ENCOUNTER — Encounter: Payer: Self-pay | Admitting: Internal Medicine

## 2015-03-25 ENCOUNTER — Ambulatory Visit (AMBULATORY_SURGERY_CENTER): Payer: Medicare Other | Admitting: Internal Medicine

## 2015-03-25 VITALS — BP 140/63 | HR 66 | Temp 96.9°F | Resp 14 | Ht 71.0 in | Wt 205.0 lb

## 2015-03-25 DIAGNOSIS — Z86010 Personal history of colonic polyps: Secondary | ICD-10-CM | POA: Diagnosis not present

## 2015-03-25 MED ORDER — SODIUM CHLORIDE 0.9 % IV SOLN: 500.0000 mL | INTRAVENOUS | Status: DC

## 2015-03-25 NOTE — Progress Notes (Signed)
To pacu vss patent aw report to rn 

## 2015-03-25 NOTE — Patient Instructions (Signed)
YOU HAD AN ENDOSCOPIC PROCEDURE TODAY AT THE [facility]:   Refer to the procedure report that was given to you for any specific questions about what was found during the examination.  If the procedure report does not answer your questions, please call your gastroenterologist to clarify.  If you requested that your care partner not be given the details of your procedure findings, then the procedure report has been included in a sealed envelope for you to review at your convenience later.  YOU SHOULD EXPECT: Some feelings of bloating in the abdomen. Passage of more gas than usual.  Walking can help get rid of the air that was put into your GI tract during the procedure and reduce the bloating. If you had a lower endoscopy (such as a colonoscopy or flexible sigmoidoscopy) you may notice spotting of blood in your stool or on the toilet paper. If you underwent a bowel prep for your procedure, you may not have a normal bowel movement for a few days.  Please Note:  You might notice some irritation and congestion in your nose or some drainage.  This is from the oxygen used during your procedure.  There is no need for concern and it should clear up in a day or so.  SYMPTOMS TO REPORT IMMEDIATELY:   Following lower endoscopy (colonoscopy or flexible sigmoidoscopy):  Excessive amounts of blood in the stool  Significant tenderness or worsening of abdominal pains  Swelling of the abdomen that is new, acute  Fever of 100F or higher   For urgent or emergent issues, a gastroenterologist can be reached at any hour by calling [phone].   DIET: Your first meal following the procedure should be a small meal and then it is ok to progress to your normal diet. Heavy or fried foods are harder to digest and may make you feel nauseous or bloated.  Likewise, meals heavy in dairy and vegetables can increase bloating.  Drink plenty of fluids but you should avoid alcoholic beverages for 24  hours.  ACTIVITY:  You should plan to take it easy for the rest of today and you should NOT DRIVE or use heavy machinery until tomorrow (because of the sedation medicines used during the test).    FOLLOW UP: Our staff will call the number listed on your records the next business day following your procedure to check on you and address any questions or concerns that you may have regarding the information given to you following your procedure. If we do not reach you, we will leave a message.  However, if you are feeling well and you are not experiencing any problems, there is no need to return our call.  We will assume that you have returned to your regular daily activities without incident.  If any biopsies were taken you will be contacted by phone or by letter within the next 1-3 weeks.  Please call us at [phone] if you have not heard about the biopsies in 3 weeks.    SIGNATURES/CONFIDENTIALITY: You and/or your care partner have signed paperwork which will be entered into your electronic medical record.  These signatures attest to the fact that that the information above on your After Visit Summary has been reviewed and is understood.  Full responsibility of the confidentiality of this discharge information lies with you and/or your care-partner. 

## 2015-03-25 NOTE — Op Note (Signed)
[place]  [address] [place], [id number]   COLONOSCOPY PROCEDURE REPORT  PATIENT: Andrew, Guerra  MR#: [id number] BIRTHDATE: Dec 28, 1940 , 35  yrs. old GENDER: male ENDOSCOPIST: Eustace Quail, MD REFERRED EQ:[id number] Program Recall PROCEDURE DATE:  03/25/2015 PROCEDURE:   Colonoscopy, surveillance  ASA CLASS:   Class II INDICATIONS:Surveillance due to prior colonic neoplasia.. Prior examinations 2006 with hyperplastic polyps and 2011 with SSP MEDICATIONS: Propofol 320 mg IV and Monitored anesthesia care  DESCRIPTION OF PROCEDURE:   After the risks benefits and alternatives of the procedure were thoroughly explained, informed consent was obtained.  The digital rectal exam revealed no abnormalities of the rectum.   The [id number]  endoscope was introduced through the anus and advanced to the cecum, which was identified by both the appendix and ileocecal valve. No adverse events experienced.   The quality of the prep was excellent. (Suprep was used)  The instrument was then slowly withdrawn as the colon was fully examined. Estimated blood loss is zero unless otherwise noted in this procedure report.    COLON FINDINGS: A normal appearing cecum, ileocecal valve, and appendiceal orifice were identified.  The ascending, transverse, descending, sigmoid colon, and rectum appeared unremarkable. Retroflexed views revealed internal hemorrhoids. The time to cecum = 3.4 Withdrawal time = 9.8   The scope was withdrawn and the procedure completed. COMPLICATIONS: There were no immediate complications.  ENDOSCOPIC IMPRESSION: 1. Normal colonoscopy  RECOMMENDATIONS: 1. Return to the care of your primary provider.  GI follow up as needed  eSigned:  Eustace Quail, MD 03/25/2015 11:31 AM   cc: The Patient and Sallee Lange, MD

## 2015-03-26 ENCOUNTER — Telehealth: Payer: Self-pay

## 2015-03-26 NOTE — Telephone Encounter (Signed)
  Follow up Call-  Call back number 03/25/2015  Post procedure Call Back phone  # [phone]  Permission to leave phone message Yes     Patient questions:  Do you have a fever, pain , or abdominal swelling? No. Pain Score  0 *  Have you tolerated food without any problems? Yes.    Have you been able to return to your normal activities? Yes.    Do you have any questions about your discharge instructions: Diet   No. Medications  No. Follow up visit  No.  Do you have questions or concerns about your Care? No.  Actions: * If pain score is 4 or above: No action needed, pain <4.

## 2015-04-21 ENCOUNTER — Telehealth: Payer: Self-pay | Admitting: Family Medicine

## 2015-04-21 DIAGNOSIS — I10 Essential (primary) hypertension: Secondary | ICD-10-CM

## 2015-04-21 DIAGNOSIS — E785 Hyperlipidemia, unspecified: Secondary | ICD-10-CM

## 2015-04-21 NOTE — Telephone Encounter (Signed)
Spoke with patient and informed him per Dr.Scott Luking that labs have been ordered for next appointment. Patient verbalized understanding.

## 2015-04-21 NOTE — Telephone Encounter (Signed)
Lipid, liver, metabolic 7 

## 2015-04-21 NOTE — Telephone Encounter (Signed)
Pt is requesting lab orders to be sent over for his upcoming appt.  Last labs per epic were: bmp and microalbumin on 11/05/14

## 2015-04-29 LAB — HEPATIC FUNCTION PANEL
ALBUMIN: 4.1 g/dL (ref 3.5–4.8)
ALT: 16 IU/L (ref 0–44)
AST: 22 IU/L (ref 0–40)
Alkaline Phosphatase: 86 IU/L (ref 39–117)
Bilirubin Total: 0.3 mg/dL (ref 0.0–1.2)
Bilirubin, Direct: 0.09 mg/dL (ref 0.00–0.40)
Total Protein: 6.8 g/dL (ref 6.0–8.5)

## 2015-04-29 LAB — LIPID PANEL
CHOL/HDL RATIO: 3.5 ratio (ref 0.0–5.0)
Cholesterol, Total: 170 mg/dL (ref 100–199)
HDL: 48 mg/dL (ref >39)
LDL CALC: 101 mg/dL — AB (ref 0–99)
Triglycerides: 107 mg/dL (ref 0–149)
VLDL CHOLESTEROL CAL: 21 mg/dL (ref 5–40)

## 2015-04-29 LAB — BASIC METABOLIC PANEL
BUN/Creatinine Ratio: 14 (ref 10–24)
BUN: 19 mg/dL (ref 8–27)
CALCIUM: 9.6 mg/dL (ref 8.6–10.2)
CHLORIDE: 100 mmol/L (ref 96–106)
CO2: 26 mmol/L (ref 18–29)
Creatinine, Ser: 1.34 mg/dL — ABNORMAL HIGH (ref 0.76–1.27)
GFR calc Af Amer: 59 mL/min/{1.73_m2} — ABNORMAL LOW (ref >59)
GFR, EST NON AFRICAN AMERICAN: 51 mL/min/{1.73_m2} — AB (ref >59)
Glucose: 93 mg/dL (ref 65–99)
POTASSIUM: 4.3 mmol/L (ref 3.5–5.2)
Sodium: 144 mmol/L (ref 134–144)

## 2015-05-06 ENCOUNTER — Ambulatory Visit (INDEPENDENT_AMBULATORY_CARE_PROVIDER_SITE_OTHER): Payer: Medicare Other | Admitting: Family Medicine

## 2015-05-06 ENCOUNTER — Encounter: Payer: Self-pay | Admitting: Family Medicine

## 2015-05-06 VITALS — BP 132/82 | Ht 70.5 in | Wt 205.6 lb

## 2015-05-06 DIAGNOSIS — I10 Essential (primary) hypertension: Secondary | ICD-10-CM

## 2015-05-06 DIAGNOSIS — R911 Solitary pulmonary nodule: Secondary | ICD-10-CM | POA: Diagnosis not present

## 2015-05-06 DIAGNOSIS — Z0000 Encounter for general adult medical examination without abnormal findings: Secondary | ICD-10-CM

## 2015-05-06 MED ORDER — AMLODIPINE BESYLATE 10 MG PO TABS: 10.0000 mg | ORAL_TABLET | Freq: Every day | ORAL | Status: DC

## 2015-05-06 MED ORDER — HYDROCHLOROTHIAZIDE 25 MG PO TABS: 25.0000 mg | ORAL_TABLET | Freq: Every day | ORAL | Status: DC

## 2015-05-06 MED ORDER — METOPROLOL SUCCINATE ER 50 MG PO TB24: 50.0000 mg | ORAL_TABLET | Freq: Every day | ORAL | Status: DC

## 2015-05-06 NOTE — Progress Notes (Signed)
Subjective:    Patient ID: Andrew Guerra, male    DOB: [birth year], 75 y.o.   MRN: [id number]  Hypertension This is a chronic problem. The current episode started more than 1 year ago. Pertinent negatives include no chest pain, headaches or neck pain. Risk factors for coronary artery disease include male gender. Treatments tried: norvasc, hctz, metoprolol. There are no compliance problems.    No concerns The patient comes in today for a wellness visit.    A review of their health history was completed.  A review of medications was also completed.  Any needed refills; refills are needed  Eating habits: Eats well tries watch diet fairly closely  Falls/  MVA accidents in past few months: No accidents no falls  Regular exercise: Exercises 3-4 times per week  Specialist pt sees on regular basis: Sees urology every 6 months  Preventative health issues were discussed.   Additional concerns: Patient's PSA being followed by urology also a small pulmonary nodule patient not interested in any type of major workup.   Review of Systems  Constitutional: Negative for fever, activity change and appetite change.  HENT: Negative for congestion and rhinorrhea.   Eyes: Negative for discharge.  Respiratory: Negative for cough and wheezing.   Cardiovascular: Negative for chest pain.  Gastrointestinal: Negative for vomiting, abdominal pain and blood in stool.  Genitourinary: Negative for frequency and difficulty urinating.  Musculoskeletal: Negative for neck pain.  Skin: Negative for rash.  Allergic/Immunologic: Negative for environmental allergies and food allergies.  Neurological: Negative for weakness and headaches.  Psychiatric/Behavioral: Negative for agitation.       Objective:   Physical Exam  Constitutional: He appears well-developed and well-nourished.  HENT:  Head: Normocephalic and atraumatic.  Right Ear: External ear normal.  Left Ear: External ear normal.  Nose: Nose  normal.  Mouth/Throat: Oropharynx is clear and moist.  Eyes: EOM are normal. Pupils are equal, round, and reactive to light.  Neck: Normal range of motion. Neck supple. No thyromegaly present.  Cardiovascular: Normal rate, regular rhythm and normal heart sounds.   No murmur heard. Pulmonary/Chest: Effort normal and breath sounds normal. No respiratory distress. He has no wheezes.  Abdominal: Soft. Bowel sounds are normal. He exhibits no distension and no mass. There is no tenderness.  Genitourinary: Penis normal.  Musculoskeletal: Normal range of motion. He exhibits no edema.  Lymphadenopathy:    He has no cervical adenopathy.  Neurological: He is alert. He exhibits normal muscle tone.  Skin: Skin is warm and dry. No erythema.  Psychiatric: He has a normal mood and affect. His behavior is normal. Judgment normal.   Cognitively patient can recall items 3 for 3. Can draw clock well. Passes this test       Assessment & Plan:  Wellness-safety dietary discussed. Patient overall doing well. Up-to-date on immunizations. Cognitive skills doing good. No falls no depression.  Solitary pulmonary nodule small patient does not want any aggressive measures we will discuss with thoracic surgery regarding if they feel it is reasonable to do a follow-up scan in 6 months. Versus recommending PET scan etc. Once again patient has stated that he is not interested in any aggressive measures. Patient is aware that this could be cancer.  Followed by urology for prostate kidney issue  Renal insufficiency blood pressure looks very good lab work looks good  Hyperlipidemia minimal elevation of LDL dietary measures discussed no new medicines  HTN rate control continue current measures continue exercising watching diet as  well

## 2015-08-12 ENCOUNTER — Other Ambulatory Visit: Payer: Self-pay | Admitting: Urology

## 2015-08-12 DIAGNOSIS — C641 Malignant neoplasm of right kidney, except renal pelvis: Secondary | ICD-10-CM

## 2015-09-17 ENCOUNTER — Telehealth: Payer: Self-pay | Admitting: Family Medicine

## 2015-09-17 DIAGNOSIS — R911 Solitary pulmonary nodule: Secondary | ICD-10-CM

## 2015-09-17 NOTE — Telephone Encounter (Signed)
The patient had CT scan of the chest at the end of January with his urologist that showed a small pulmonary nodule. This needs to be followed. I discussed this with the patient the last time he was here about willingness to do a follow-up scan. Although he did not 1 any aggressive measures he seemed willing to do a follow-up CAT scan. I recommend a follow-up CT scan of the chest with contrast, reason pulmonary nodule, talk with the patient please set this up at [facility] imaging(I believe they are part of [facility]). Please set up patient to do a follow-up office visit within 1 week of his test.

## 2015-09-17 NOTE — Telephone Encounter (Signed)
We will follow-up on what this scan shows if it does not show the nodule he will need to have a CT scan of his chest-will leave this message open until results from MRI are posted

## 2015-09-17 NOTE — Telephone Encounter (Signed)
Patient states that urology scheduled him a follow up scan for this on Sept 8 at 7:45 am at [facility] long. Patient states he is scheduled for an MRI abdomin with and with out contrast and  this is a repeat of the scan in which the nodule was visualized on in January.

## 2015-09-26 ENCOUNTER — Ambulatory Visit (HOSPITAL_COMMUNITY)
Admission: RE | Admit: 2015-09-26 | Discharge: 2015-09-26 | Disposition: A | Discharge status: Home / Self Care | Payer: Medicare Other | Source: Ambulatory Visit | Attending: Urology | Admitting: Urology

## 2015-09-26 DIAGNOSIS — C641 Malignant neoplasm of right kidney, except renal pelvis: Secondary | ICD-10-CM | POA: Insufficient documentation

## 2015-09-26 LAB — POCT I-STAT CREATININE: CREATININE: 1.5 mg/dL — AB (ref 0.61–1.24)

## 2015-09-26 MED ORDER — GADOBENATE DIMEGLUMINE 529 MG/ML IV SOLN
20.0000 mL | Freq: Once | INTRAVENOUS | Status: AC | PRN
  Administered 2015-09-26: 18 mL via INTRAVENOUS

## 2015-09-28 NOTE — Telephone Encounter (Signed)
Please inform Mr. Moring that I reviewed over his MRI of the abdomen. It did not include the chest. They were more focused on his kidneys. As for the pulmonary nodule it is recommended to do a CT scan of the chest to look at this area and make sure that it is not turning into a cancer. Please see if the patient is willing to get this set up.

## 2015-09-29 NOTE — Addendum Note (Signed)
Addended by: Jesusita Oka on: 09/29/2015 08:49 AM   Modules accepted: Orders

## 2015-09-29 NOTE — Telephone Encounter (Signed)
This message will be left open. If the patient does not call back within the next 3 weeks for an office visit we will connect with him to have him make 1. It is in his best interest to do the CT scan

## 2015-09-29 NOTE — Telephone Encounter (Signed)
Notified patient that Dr. Nicki Reaper reviewed over his MRI of the abdomen. It did not include the chest. They were more focused on his kidneys. As for the pulmonary nodule it is recommended to do a CT scan of the chest to look at this area and make sure that it is not turning into a cancer. Patient stated that he does not want to have this test done right now. He will call back and schedule an appointment concerning this because he wants to see the doctor face to face first.

## 2015-09-29 NOTE — Addendum Note (Signed)
Addended by: Jesusita Oka on: 09/29/2015 08:44 AM   Modules accepted: Orders

## 2015-10-23 ENCOUNTER — Telehealth: Payer: Self-pay | Admitting: Family Medicine

## 2015-10-23 DIAGNOSIS — E785 Hyperlipidemia, unspecified: Secondary | ICD-10-CM

## 2015-10-23 DIAGNOSIS — I10 Essential (primary) hypertension: Secondary | ICD-10-CM

## 2015-10-23 NOTE — Telephone Encounter (Signed)
Metabolic 7, lipid, liver

## 2015-10-23 NOTE — Telephone Encounter (Signed)
Patient requesting order for blood work for upcoming appointment.

## 2015-10-23 NOTE — Telephone Encounter (Signed)
Left message return call 10/23/15. Labs ordered

## 2015-10-24 NOTE — Telephone Encounter (Signed)
Pt.notified

## 2015-10-28 LAB — HEPATIC FUNCTION PANEL
ALK PHOS: 87 IU/L (ref 39–117)
ALT: 16 IU/L (ref 0–44)
AST: 21 IU/L (ref 0–40)
Albumin: 4.1 g/dL (ref 3.5–4.8)
BILIRUBIN TOTAL: 0.4 mg/dL (ref 0.0–1.2)
BILIRUBIN, DIRECT: 0.09 mg/dL (ref 0.00–0.40)
TOTAL PROTEIN: 6.6 g/dL (ref 6.0–8.5)

## 2015-10-28 LAB — BASIC METABOLIC PANEL
BUN/Creatinine Ratio: 17 (ref 10–24)
BUN: 24 mg/dL (ref 8–27)
CALCIUM: 9.4 mg/dL (ref 8.6–10.2)
CHLORIDE: 99 mmol/L (ref 96–106)
CO2: 29 mmol/L (ref 18–29)
Creatinine, Ser: 1.39 mg/dL — ABNORMAL HIGH (ref 0.76–1.27)
GFR, EST AFRICAN AMERICAN: 56 mL/min/{1.73_m2} — AB (ref >59)
GFR, EST NON AFRICAN AMERICAN: 49 mL/min/{1.73_m2} — AB (ref >59)
GLUCOSE: 91 mg/dL (ref 65–99)
POTASSIUM: 3.9 mmol/L (ref 3.5–5.2)
SODIUM: 141 mmol/L (ref 134–144)

## 2015-10-28 LAB — LIPID PANEL
CHOL/HDL RATIO: 3.8 ratio (ref 0.0–5.0)
Cholesterol, Total: 162 mg/dL (ref 100–199)
HDL: 43 mg/dL (ref >39)
LDL Calculated: 105 mg/dL — ABNORMAL HIGH (ref 0–99)
Triglycerides: 68 mg/dL (ref 0–149)
VLDL Cholesterol Cal: 14 mg/dL (ref 5–40)

## 2015-11-04 ENCOUNTER — Telehealth: Payer: Self-pay

## 2015-11-04 ENCOUNTER — Encounter: Payer: Self-pay | Admitting: Family Medicine

## 2015-11-04 ENCOUNTER — Ambulatory Visit (INDEPENDENT_AMBULATORY_CARE_PROVIDER_SITE_OTHER): Payer: Medicare Other | Admitting: Family Medicine

## 2015-11-04 VITALS — BP 104/76 | Ht 70.5 in | Wt 201.1 lb

## 2015-11-04 DIAGNOSIS — I10 Essential (primary) hypertension: Secondary | ICD-10-CM | POA: Diagnosis not present

## 2015-11-04 DIAGNOSIS — R011 Cardiac murmur, unspecified: Secondary | ICD-10-CM | POA: Diagnosis not present

## 2015-11-04 DIAGNOSIS — R911 Solitary pulmonary nodule: Secondary | ICD-10-CM

## 2015-11-04 DIAGNOSIS — C649 Malignant neoplasm of unspecified kidney, except renal pelvis: Secondary | ICD-10-CM

## 2015-11-04 DIAGNOSIS — E785 Hyperlipidemia, unspecified: Secondary | ICD-10-CM

## 2015-11-04 DIAGNOSIS — Z8679 Personal history of other diseases of the circulatory system: Secondary | ICD-10-CM

## 2015-11-04 HISTORY — DX: Personal history of other diseases of the circulatory system: Z86.79

## 2015-11-04 MED ORDER — HYDROCHLOROTHIAZIDE 25 MG PO TABS: 25.0000 mg | ORAL_TABLET | Freq: Every day | ORAL | 1 refills | Status: DC

## 2015-11-04 MED ORDER — METOPROLOL SUCCINATE ER 50 MG PO TB24: 50.0000 mg | ORAL_TABLET | Freq: Every day | ORAL | 1 refills | Status: DC

## 2015-11-04 MED ORDER — AMLODIPINE BESYLATE 10 MG PO TABS: 10.0000 mg | ORAL_TABLET | Freq: Every day | ORAL | 1 refills | Status: DC

## 2015-11-04 NOTE — Telephone Encounter (Signed)
Please call patient and schedule CT chest w/o contrast. Order in system. Patient wants to have this done at [place]. Order in system.  Patient # [phone]

## 2015-11-04 NOTE — Progress Notes (Signed)
   Subjective:    Patient ID: Andrew Guerra, male    DOB: [birth year], 75 y.o.   MRN: [id number]  Hypertension  This is a chronic problem. The current episode started more than 1 year ago. The problem has been gradually improving since onset. There are no associated agents to hypertension. There are no known risk factors for coronary artery disease. Treatments tried: hctz, metoprolol. The current treatment provides moderate improvement. There are no compliance problems.    Patient has no concerns at this time.  Patient states he takes blood pressure medicine regular basis denies any problem with it He does try to watch his diet He does have a history of a pulmonary nodule for which we have advised him to get this rechecked with a CAT scan he is delayed this but now he agrees He has her what appears to be a possible small renal cell cancer being [facility] if he gets larger they were more than likely recommend removal He denies any weight loss hemoptysis wheezing vomiting chest tightness pressure pain. Denies chills sweats weight loss. No longer smokes.  Review of Systems    see above. 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 Patient was due  earlier but did not want to get the test completed  Follow-up 6 months

## 2015-11-04 NOTE — Telephone Encounter (Signed)
Please call patient and schedule CT chest w/o contrast. Order in system. Patient wants to have this done at [place]. Order in system.  Patient # [phone]

## 2015-11-05 NOTE — Telephone Encounter (Signed)
Spoke with patient and informed him that CT is scheduled for 11/11/15 at [facility] at [place], [place]. Patient verbalized understanding.

## 2015-11-11 ENCOUNTER — Ambulatory Visit
Admission: RE | Admit: 2015-11-11 | Discharge: 2015-11-11 | Disposition: A | Discharge status: Home / Self Care | Payer: Medicare Other | Source: Ambulatory Visit | Attending: Family Medicine | Admitting: Family Medicine

## 2015-11-14 ENCOUNTER — Telehealth: Payer: Self-pay | Admitting: Family Medicine

## 2015-11-17 NOTE — Telephone Encounter (Signed)
Error

## 2015-11-18 ENCOUNTER — Other Ambulatory Visit: Payer: Self-pay | Admitting: *Deleted

## 2015-11-18 DIAGNOSIS — C3490 Malignant neoplasm of unspecified part of unspecified bronchus or lung: Secondary | ICD-10-CM

## 2015-11-19 ENCOUNTER — Telehealth: Payer: Self-pay | Admitting: Family Medicine

## 2015-11-19 ENCOUNTER — Telehealth: Payer: Self-pay | Admitting: *Deleted

## 2015-11-19 NOTE — Telephone Encounter (Signed)
Oncology Nurse Navigator Documentation  Oncology Nurse Navigator Flowsheets 11/19/2015  Navigator Location [facility]  Navigator Encounter Type Telephone/Mr. Mudgett called and left me a message to call.  I called him back and updated him on appt for [place] on 11/27/15.  He verbalized understanding of appt time and place.  I updated referring office on appt for patient, I left vm message with [place].   Telephone Outgoing Call  Treatment Phase Pre-Tx/Tx Discussion  Barriers/Navigation Needs Coordination of Care  Interventions Coordination of Care  Coordination of Care Appts  Acuity Level 1  Acuity Level 1 Minimal follow up required  Time Spent with Patient 15

## 2015-11-19 NOTE — Telephone Encounter (Signed)
Patient's PET scan skull/ thigh does not require notification or Prior Authorization. PET scan authorized in epic. Questions regarding this please call [phone].

## 2015-11-19 NOTE — Telephone Encounter (Signed)
Oncology Nurse Navigator Documentation  Oncology Nurse Navigator Flowsheets 11/19/2015  Navigator Location [facility]  Referral date to RadOnc/MedOnc 11/18/2015  Navigator Encounter Type Introductory phone call;Telephone/I received referral on Andrew Guerra.  I called to schedule him.  I was unable to reach and left vm message with my name and phone number to call.   Treatment Phase Pre-Tx/Tx Discussion  Barriers/Navigation Needs Coordination of Care  Interventions Coordination of Care  Coordination of Care Appts  Acuity Level 1  Acuity Level 1 Initial guidance, education and coordination as needed  Time Spent with Patient 15

## 2015-11-21 ENCOUNTER — Telehealth: Payer: Self-pay | Admitting: *Deleted

## 2015-11-21 NOTE — Telephone Encounter (Signed)
Mailed [facility] letter to pt.  

## 2015-11-25 ENCOUNTER — Encounter (HOSPITAL_COMMUNITY)
Admission: RE | Admit: 2015-11-25 | Discharge: 2015-11-25 | Disposition: A | Discharge status: Home / Self Care | Payer: Medicare Other | Source: Ambulatory Visit | Attending: Family Medicine | Admitting: Family Medicine

## 2015-11-25 DIAGNOSIS — C3490 Malignant neoplasm of unspecified part of unspecified bronchus or lung: Secondary | ICD-10-CM | POA: Insufficient documentation

## 2015-11-25 LAB — GLUCOSE, CAPILLARY: Glucose-Capillary: 90 mg/dL (ref 65–99)

## 2015-11-25 MED ORDER — FLUDEOXYGLUCOSE F - 18 (FDG) INJECTION
10.0300 | Freq: Once | INTRAVENOUS | Status: AC | PRN
  Administered 2015-11-25: 10.03 via INTRAVENOUS

## 2015-11-27 ENCOUNTER — Encounter: Payer: Self-pay | Admitting: *Deleted

## 2015-11-27 ENCOUNTER — Ambulatory Visit: Payer: Medicare Other | Attending: Radiation Oncology | Admitting: Physical Therapy

## 2015-11-27 ENCOUNTER — Institutional Professional Consult (permissible substitution) (INDEPENDENT_AMBULATORY_CARE_PROVIDER_SITE_OTHER): Payer: Medicare Other | Admitting: Cardiothoracic Surgery

## 2015-11-27 ENCOUNTER — Ambulatory Visit
Admission: RE | Admit: 2015-11-27 | Discharge: 2015-11-27 | Disposition: A | Discharge status: Home / Self Care | Payer: Medicare Other | Source: Ambulatory Visit | Attending: Radiation Oncology | Admitting: Radiation Oncology

## 2015-11-27 VITALS — BP 149/77 | HR 70 | Temp 98.7°F | Resp 18 | Wt 203.6 lb

## 2015-11-27 VITALS — BP 149/77 | HR 70 | Temp 98.7°F | Resp 18 | Wt 203.8 lb

## 2015-11-27 DIAGNOSIS — R911 Solitary pulmonary nodule: Secondary | ICD-10-CM | POA: Diagnosis not present

## 2015-11-27 DIAGNOSIS — R293 Abnormal posture: Secondary | ICD-10-CM | POA: Diagnosis not present

## 2015-11-27 NOTE — Progress Notes (Signed)
Radiation Oncology         [phone]) [phone] ________________________________  Thoracic Oncology Multidisciplinary Clinic Consultation  Name: Andrew Guerra MRN: [id number]  Date: 11/27/2015  DOB: [birth year]  OB:SJGGE Wolfgang Phoenix, MD  [name], MD   REFERRING PHYSICIAN: [name], MD  DIAGNOSIS: There were no encounter diagnoses.  No diagnosis found.  HISTORY OF PRESENT ILLNESS: Andrew Guerra is a 75 y.o. male seen at the request of PCP Dr. Wolfgang Phoenix. He has a history of a pulmonary nodule being monitored with routine CT imaging. Recent CT Chest on 11/11/2015 showed continued growth of a right upper lobe pulmonary nodule, now 17 mm in diameter, consistent with bronchogenic carcinoma. No mediastinal adenopathy seen. PET scan on 11/25/2015 showed th nodule within the base of the right upper lobe is intensely hypermetabolic compatible with primary bronchogenic carcinoma. There was also a second focus of malignant range FDG uptake localizing to the anterior medial left upper lobe where there is a nodular density within a background of scarring. There was no evidence for hypermetabolic hilar or mediastinal adenopathy or distant metastatic disease. He comes today to review the findings and to discuss the options for radiotherapy.          PREVIOUS RADIATION THERAPY: No  PAST MEDICAL HISTORY:  Past Medical History:  Diagnosis Date  . Cataract    right eye - surgery to remove  . Full dentures   . Glaucoma   . Heart murmur    since childhood, never has caused any problems  . Hypertension   . Sickle cell trait ([facility])    no problems per patient      PAST SURGICAL HISTORY: Past Surgical History:  Procedure Laterality Date  . cataract eye surgery     right eye  . COLONOSCOPY  11/2009   hx polyps/[place]  . right eye surgery     to lower eye pressure  . TONSILLECTOMY    . wisdom tteeth ext      FAMILY HISTORY:  Family History  Problem Relation Age of Onset  . Diabetes Mother     . Colon cancer Neg Hx   . Colon polyps Neg Hx   . Esophageal cancer Neg Hx   . Rectal cancer Neg Hx   . Stomach cancer Neg Hx     SOCIAL HISTORY:  Social History   Social History  . Marital status: Married    Spouse name: N/A  . Number of children: N/A  . Years of education: N/A   Occupational History  . Not on file.   Social History Main Topics  . Smoking status: Former Smoker    Packs/day: 1.00    Types: Cigarettes    Quit date: 01/19/2011  . Smokeless tobacco: Never Used  . Alcohol use 0.6 oz/week    1 Shots of liquor per week  . Drug use: No  . Sexual activity: Not on file   Other Topics Concern  . Not on file   Social History Narrative  . No narrative on file  The patient is married and accompanied by his wife. He is originally from [place]. He worked in a Adult nurse prior to retirement.  ALLERGIES: Patient has no known allergies.  MEDICATIONS:  Current Outpatient Prescriptions  Medication Sig Dispense Refill  . amLODipine (NORVASC) 10 MG tablet Take 1 tablet (10 mg total) by mouth daily. 90 tablet 1  . Brinzolamide-Brimonidine (SIMBRINZA) 1-0.2 % SUSP Apply to eye.    . Flaxseed,  Linseed, (FLAX SEEDS) POWD Take by mouth.    . hydrochlorothiazide (HYDRODIURIL) 25 MG tablet Take 1 tablet (25 mg total) by mouth daily. 90 tablet 1  . metoprolol succinate (TOPROL-XL) 50 MG 24 hr tablet Take 1 tablet (50 mg total) by mouth daily. Take with or immediately following a meal. 90 tablet 1  . Multiple Vitamin (MULTIVITAMIN) tablet Take 1 tablet by mouth daily.    . timolol (BETIMOL) 0.5 % ophthalmic solution 1 drop 2 (two) times daily.    . XIIDRA 5 % SOLN PLACE 1 DROP INTO EACH EYE TWICE A DAY  6   No current facility-administered medications for this encounter.     REVIEW OF SYSTEMS:  On review of systems, the patient reports that he is doing well overall. He denies any chest pain, shortness of breath, cough, fevers, chills, night sweats, unintended  weight changes. He denies any bowel or bladder disturbances, and denies abdominal pain, nausea or vomiting. He denies any new musculoskeletal or joint aches or pains. A complete review of systems is obtained and is otherwise negative.     PHYSICAL EXAM:  Wt Readings from Last 3 Encounters:  11/27/15 203 lb 9.6 oz (92.4 kg)  11/27/15 203 lb 12.3 oz (92.4 kg)  11/04/15 201 lb 2 oz (91.2 kg)   Temp Readings from Last 3 Encounters:  11/27/15 98.7 F (37.1 C)  11/27/15 98.7 F (37.1 C)  03/25/15 (!) 96.9 F (36.1 C)   BP Readings from Last 3 Encounters:  11/27/15 (!) 149/77  11/27/15 (!) 149/77  11/04/15 104/76   Pulse Readings from Last 3 Encounters:  11/27/15 70  11/27/15 70  03/25/15 66   In general this is a well appearing African American male in no acute distress. He is alert and oriented x4 and appropriate throughout the examination. HEENT reveals that the patient is normocephalic, atraumatic. EOMs are intact. PERRLA. Skin is intact without any evidence of gross lesions. Cardiovascular exam reveals a regular rate and rhythm, no clicks rubs or murmurs are auscultated. Chest is clear to auscultation bilaterally. Lymphatic assessment is performed and does not reveal any adenopathy in the cervical, supraclavicular, axillary, or inguinal chains. Abdomen has active bowel sounds in all quadrants and is intact. The abdomen is soft, non tender, non distended. Lower extremities are negative for pretibial pitting edema, deep calf tenderness, cyanosis or clubbing.   KPS = 90  100 - Normal; no complaints; no evidence of disease. 90   - Able to carry on normal activity; minor signs or symptoms of disease. 80   - Normal activity with effort; some signs or symptoms of disease. 70   - Cares for self; unable to carry on normal activity or to do active work. 60   - Requires occasional assistance, but is able to care for most of his personal needs. 50   - Requires considerable assistance and  frequent medical care. 40   - Disabled; requires special care and assistance. 30   - Severely disabled; hospital admission is indicated although death not imminent. 20   - Very sick; hospital admission necessary; active supportive treatment necessary. 10   - Moribund; fatal processes progressing rapidly. 0     - Dead  Karnofsky DA, Abelmann WH, Craver LS and Burchenal JH (1948) The use of the nitrogen mustards in the palliative treatment of carcinoma: with particular reference to bronchogenic carcinoma Cancer 1 634-56  LABORATORY DATA:  No results found for: WBC, HGB, HCT, MCV, PLT Lab Results    Component Value Date   NA 141 10/27/2015   K 3.9 10/27/2015   CL 99 10/27/2015   CO2 29 10/27/2015   Lab Results  Component Value Date   ALT 16 10/27/2015   AST 21 10/27/2015   ALKPHOS 87 10/27/2015   BILITOT 0.4 10/27/2015     RADIOGRAPHY: Ct Chest Wo Contrast  Result Date: 11/11/2015 CLINICAL DATA:  Follow-up pulmonary nodule. EXAM: CT CHEST WITHOUT CONTRAST TECHNIQUE: Multidetector CT imaging of the chest was performed following the standard protocol without IV contrast. COMPARISON:  02/04/2015 FINDINGS: Cardiovascular: No cardiomegaly or pericardial effusion. Aortic valvular calcifications/ sclerosis that is at least moderate. Subendocardial fatty density along the anterior wall left ventricle. Scattered atherosclerotic calcification. Mediastinum/Nodes: Calcified right mediastinal lymph nodes consistent with remote granulomatous disease. No suspicious noncalcified lymph nodes. Right thyroid cyst noted 02/14/2015 remains predominantly decompressed. Lungs/Pleura: Continued enlargement of a lobulated pulmonary nodule in the right upper lobe, now up to 17 mm. The nodule has broad contact with the minor fissure. 3 mm pulmonary nodule in the right upper lobe on 2:65 is stable. Stable flat reticulated opacity in the paramediastinal right upper lobe. Scattered calcified granulomas. Centrilobular  emphysema. Upper Abdomen: Bilateral renal lesions which are being followed. Negative adrenal glands. Musculoskeletal: No acute or aggressive finding These results will be called to the ordering clinician or representative by the Radiologist Assistant, and communication documented in the PACS or zVision Dashboard. IMPRESSION: 1. Continued growth of a right upper lobe pulmonary nodule, now 17 mm in diameter, a bronchogenic carcinoma unless proven otherwise. No mediastinal adenopathy. 2. 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 3. At least moderate aortic valve calcifications/sclerosis. 4. Centrilobular emphysema. Electronically Signed   By: Jonathon  Watts M.D.   On: 11/11/2015 15:38   Nm Pet Image Initial (pi) Skull Base To Thigh  Result Date: 11/26/2015 CLINICAL DATA:  Initial treatment strategy for pulmonary nodule. EXAM: NUCLEAR MEDICINE PET SKULL BASE TO THIGH TECHNIQUE: 10.03 mCi F-18 FDG was injected intravenously. Full-ring PET imaging was performed from the skull base to thigh after the radiotracer. CT data was obtained and used for attenuation correction and anatomic localization. FASTING BLOOD GLUCOSE:  Value: 90 mg/dl COMPARISON:  MRI from 09/26/2015 and chest CT 11/11/2015 FINDINGS: NECK No hypermetabolic lymph nodes in the neck. CHEST No hypermetabolic mediastinal or hilar nodes. Aortic atherosclerosis. 1.7 cm right upper lobe pulmonary nodule SUV max equals 13.55. Subpleural nodular density within the anterior medial left upper lobe is hypermetabolic with an SUV max equal to 7.4. ABDOMEN/PELVIS No abnormal hypermetabolic activity within the liver, pancreas, adrenal glands, or spleen. Aortic atherosclerosis. Infrarenal abdominal aorta measures 2.6 cm, image 136 series 4. Complex cysts within the right kidney are again noted, image 104 of series 4 and image 114 of series 4. These are better described on contrast enhanced MRI from 09/26/2015. Prostate gland appears  enlarged. No hypermetabolic lymph nodes in the abdomen or pelvis. SKELETON No focal hypermetabolic activity to suggest skeletal metastasis. IMPRESSION: 1. Nodule within the base of the right upper lobe is intensely hypermetabolic compatible with primary bronchogenic carcinoma. Correlation with tissue sampling is advised. 2. There is a second focus of malignant range FDG uptake uptake localizing to the anterior medial left upper lobe where there is a nodular density within a background of scarring. A second small pulmonary neoplasm cannot be excluded. 3. No evidence for hypermetabolic hilar or mediastinal adenopathy or distant metastatic disease. 4. Complex lesions within the right kidney are better evaluated on   recent contrast enhanced MRI of the abdomen from 09/26/2015. 5. Aortic atherosclerosis. Ectatic abdominal aorta at risk for aneurysm development. Recommend followup by ultrasound in 5 years. This recommendation follows ACR consensus guidelines: White Paper of the ACR Incidental Findings Committee II on Vascular Findings. J Am Coll Radiol 2013; 10:789-794. Electronically Signed   By: Kerby Moors M.D.   On: 11/26/2015 08:50      IMPRESSION/PLAN: 1. 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 Dr. Tammi Klippel met with the patient today to review the imaging findings and work-up thus far.  We discussed the natural history of lung cancer and general treatment, highlighting the role of radiotherapy in the management of his disease, particularly as this is a treatment modality for early stage disease. We discussed the role for biopsy of this lesion and recommended a needle biopsy to confirm diagnosis, and detailed the recommendations for stereotactic body radiotherapy (SBRT). He has also met with Dr. Servando Snare and will consider the options surgically he's been presented. When we reviewed the radiation options, we focused on the details of logistics and delivery.  We reviewed the anticipated acute and late  sequelae associated with radiation in this setting.  The patient would like to review his options for treatment and will contact our office if he would like to proceed with biopsy and radiation treatment.    The above documentation reflects my direct findings during this shared patient visit. Please see the separate note by Dr. Tammi Klippel on this date for the remainder of the patient's plan of care.   Carola Rhine, PAC   This document serves as a record of services personally performed by Tyler Pita, MD and Shona Simpson, PA-C. It was created on his behalf by Arlyce Harman, a trained medical scribe. The creation of this record is based on the scribe's personal observations and the provider's statements to them. This document has been checked and approved by the attending provider.

## 2015-11-27 NOTE — Progress Notes (Signed)
[place] Clinical Social Work  Clinical Social Work met with patient/familyat [place] appointment to offer support and assess for psychosocial needs.  Mr. Rylander shared he has met with surgeon and knows the next step is surgery, but feels that he needs time to process this decision and to move forward with surgery.  CSW validated patient's response and discussed the importance of time to process the next steps.  The patient was accompanied by his spouse, she shared she feels it is important to be proactive and move forward with the medical recommendations.  Clinical Social Work briefly discussed Clinical Social Work role and [employer] support programs/services.  Clinical Social Work encouraged patient to call with any additional questions or concerns.   Polo Riley, MSW, LCSW, OSW-C Clinical Social Worker [facility] [phone]

## 2015-11-27 NOTE — Patient Instructions (Signed)
Dr Servando Snare   [place] Nurse coordinator   Pulmonary Nodule A pulmonary nodule is a small, round growth of tissue in the lung. Pulmonary nodules can range in size from less than 1/5 inch (4 mm) to a little bigger than an inch (25 mm). Most pulmonary nodules are detected when imaging tests of the lung are being performed for a different problem. Pulmonary nodules are usually not cancerous (benign). However, some pulmonary nodules are cancerous (malignant). Follow-up treatment or testing is based on the size of the pulmonary nodule and your risk of getting lung cancer.  CAUSES Benign pulmonary nodules can be caused by various things. Some of the causes include:   Bacterial, fungal, or viral infections. This is usually an old infection that is no longer active, but it can sometimes be a current, active infection.  A benign mass of tissue.  Inflammation from conditions such as rheumatoid arthritis.   Abnormal blood vessels in the lungs. Malignant pulmonary nodules can result from lung cancer or from cancers that spread to the lung from other places in the body. SIGNS AND SYMPTOMS Pulmonary nodules usually do not cause symptoms. DIAGNOSIS Most often, pulmonary nodules are found incidentally when an X-ray or CT scan is performed to look for some other problem in the lung area. To help determine whether a pulmonary nodule is benign or malignant, your health care provider will take a medical history and order a variety of tests. Tests done may include:   Blood tests.  A skin test called a tuberculin test. This test is used to determine if you have been exposed to the germ that causes tuberculosis.   Chest X-rays. If possible, a new X-ray may be compared with X-rays you have had in the past.   CT scan. This test shows smaller pulmonary nodules more clearly than an X-ray.   Positron emission tomography (PET) scan. In this test, a safe amount of a radioactive substance is injected  into the bloodstream. Then, the scan takes a picture of the pulmonary nodule. The radioactive substance is eliminated from your body in your urine.   Biopsy. A tiny piece of the pulmonary nodule is removed so it can be checked under a microscope. TREATMENT  Pulmonary nodules that are benign normally do not require any treatment because they usually do not cause symptoms or breathing problems. Your health care provider may want to monitor the pulmonary nodule through follow-up CT scans. The frequency of these CT scans will vary based on the size of the nodule and the risk factors for lung cancer. For example, CT scans will need to be done more frequently if the pulmonary nodule is larger and if you have a history of smoking and a family history of cancer. Further testing or biopsies may be done if any follow-up CT scan shows that the size of the pulmonary nodule has increased. HOME CARE INSTRUCTIONS  Only take over-the-counter or prescription medicines as directed by your health care provider.  Keep all follow-up appointments with your health care provider. SEEK MEDICAL CARE IF:  You have trouble breathing when you are active.   You feel sick or unusually tired.   You do not feel like eating.   You lose weight without trying to.   You develop chills or night sweats.  SEEK IMMEDIATE MEDICAL CARE IF:  You cannot catch your breath, or you begin wheezing.   You cannot stop coughing.   You cough up blood.   You become dizzy  or feel like you are going to pass out.   You have sudden chest pain.   You have a fever or persistent symptoms for more than 2-3 days.   You have a fever and your symptoms suddenly get worse. MAKE SURE YOU:  Understand these instructions.  Will watch your condition.  Will get help right away if you are not doing well or get worse.   This information is not intended to replace advice given to you by your health care provider. Make sure you discuss  any questions you have with your health care provider.   Document Released: 11/01/2008 Document Revised: 09/06/2012 Document Reviewed: 06/26/2012 Elsevier Interactive Patient Education 2016 [place] Lung cancer occurs when abnormal cells in the lung grow out of control and form a mass (tumor). There are several types of lung cancer. The two most common types are:  Non-small cell. In this type of lung cancer, abnormal cells are larger and grow more slowly than those of small cell lung cancer.  Small cell. In this type of lung cancer, abnormal cells are smaller than those of non-small cell lung cancer. Small cell lung cancer gets worse faster than non-small cell lung cancer. CAUSES  The leading cause of lung cancer is smoking tobacco. The second leading cause is radon exposure. RISK FACTORS  Smoking tobacco.  Exposure to secondhand tobacco smoke.  Exposure to radon gas.  Exposure to asbestos.  Exposure to arsenic in drinking water.  Air pollution.  Family or personal history of lung cancer.  Lung radiation therapy.  Being older than 43 years. SIGNS AND SYMPTOMS  In the early stages, symptoms may not be present. As the cancer progresses, symptoms may include:  A lasting cough, possibly with blood.  Fatigue.  Unexplained weight loss.  Shortness of breath.  Wheezing.  Chest pain.  Loss of appetite. Symptoms of advanced lung cancer include:  Hoarseness.  Bone or joint pain.  Weakness.  Nail problems.  Face or arm swelling.  Paralysis of the face.  Drooping eyelids. DIAGNOSIS  Lung cancer can be identified with a physical exam and with tests such as:  A chest X-ray.  A CT scan.  Blood tests.  A biopsy. After a diagnosis is made, you will have more tests to determine the stage of the cancer. The stages of non-small cell lung cancer are:  Stage 0, also called carcinoma in situ. At this stage, abnormal cells are found in the inner  lining of your lung or lungs.  Stage I. At this stage, abnormal cells have grown into a tumor that is no larger than 5 cm across. The cancer has entered the deeper lung tissue but has not yet entered the lymph nodes or other parts of the body.  Stage II. At this stage, the tumor is 7 cm across or smaller and has entered nearby lymph nodes. Or, the tumor is 5 cm across or smaller and has invaded surrounding tissue but is not found in nearby lymph nodes. There may be more than one tumor present.  Stage III. At this stage, the tumor may be any size. There may be more than one tumor in the lungs. The cancer cells have spread to the lymph nodes and possibly to other organs.  Stage IV. At this stage, there are tumors in both lungs and the cancer has spread to other areas of the body. The stages of small cell lung cancer are:  Limited. At this stage, the cancer is  found only on one side of the chest.  Extensive. At this stage, the cancer is in the lungs and in tissues on the other side of the chest. The cancer has spread to other organs or is found in the fluid between the layers of your lungs. TREATMENT  Depending on the type and stage of your lung cancer, you may be treated with:  Surgery. This is done to remove a tumor.  Radiation therapy. This treatment destroys cancer cells using X-rays or other types of radiation.  Chemotherapy. This treatment uses medicines to destroy cancer cells.  Targeted therapy. This treatment aims to destroy only cancer cells instead of all cells as other therapies do. You may also have a combination of treatments. HOME CARE INSTRUCTIONS   Do not use any tobacco products. This includes cigarettes, chewing tobacco, and electronic cigarettes. If you need help quitting, ask your health care provider.  Take medicines only as directed by your health care provider.  Eat a healthy diet. Work with a dietitian to make sure you are getting the nutrition you  need.  Consider joining a support group or seeking counseling to help you cope with the stress of having lung cancer.  Let your cancer specialist (oncologist) know if you are admitted to the hospital.  Keep all follow-up visits as directed by your health care provider. This is important. SEEK MEDICAL CARE IF:   You lose weight without trying.  You have a persistent cough and wheezing.  You feel short of breath.  You tire easily.  You experience bone or joint pain.  You have difficulty swallowing.  You feel hoarse or notice your voice changing.  Your pain medicine is not helping. SEEK IMMEDIATE MEDICAL CARE IF:   You cough up blood.  You have new breathing problems.  You develop chest pain.  You develop swelling in:  One or both ankles or legs.  Your face, neck, or arms.  You are confused.  You experience paralysis in your face or a drooping eyelid.   This information is not intended to replace advice given to you by your health care provider. Make sure you discuss any questions you have with your health care provider.   Document Released: 04/12/2000 Document Revised: 09/25/2014 Document Reviewed: 05/10/2013 Elsevier Interactive Patient Education 2016 Else[employer] Lung Resection A lung resection is a procedure to remove part or all of a lung. When an entire lung is removed, the procedure is called a pneumonectomy. When only part of a lung is removed, the procedure is called a lobectomy. A lung resection is typically done to get rid of a tumor or cancer, but it may be done to treat other conditions. This procedure can help relieve some or all of your symptoms and can also help keep the problem from getting worse. Lung resection may provide the best chance for curing your disease. However, the procedure may not necessarily cure lung cancer if that is the problem.  LET [facility] CARE PROVIDER KNOW ABOUT:   Any allergies you have.  All medicines you are taking,  including vitamins, herbs, eye drops, creams, and over-the-counter medicines.  Previous problems you or members of your family have had with the use of anesthetics.  Any blood disorders you have.  Previous surgeries you have had.  Medical conditions you have. RISKS AND COMPLICATIONS  Generally, lung resection is a safe procedure. However, problems can occur and include:  Excessive bleeding.  Infection.  Inability to breathe without a ventilator.  Persistent shortness of breath.  Heart problems, including abnormal rhythms and a risk of heart attack or heart failure.  Blood clots.  Injury to a blood vessel.  Injury to a nerve.  Failure to heal properly.  Stroke.  Bronchopleural fistula. This is a small hole between one of the main breathing tubes (bronchus) and the lining of the lungs. This is rare.  Reaction to anesthesia. BEFORE THE PROCEDURE  You may have tests done before the procedure, including:  Blood tests.  Urine tests.  X-rays.  Other imaging tests (such as CT scans, MRI scans, and PET scans). These tests are done to find the exact size and location of the condition being treated with this surgery.  Pulmonary function tests. These are breathing tests to assess the function of your lungs before surgery and to decide how to best help your breathing after surgery.  Heart testing. This is done to make sure your heart is strong enough for the procedure.  Bronchoscopy. This is a technique that allows your health care provider to look at the inside of your airways. This is done using a soft, flexible tube (bronchoscope). Along with imaging tests, this can help your health care provider know the exact location and size of the area that will be removed during surgery.  Lymph node sampling. This may need to be done to see if the tumor has spread. It may be done as a separate surgery or right before your lung resection procedure. PROCEDURE  An IV tube will be  placed in your arm. You will be given a medicine that makes you fall asleep (general anesthetic). You may also get pain medicine through a thin, flexible tube (catheter) in your back.  A breathing tube will be placed in your throat.  Once the surgical team has prepared you for surgery, your surgeon will make an incision on your side. Some resections are done through large incisions, while others can be done through small incisions using smaller instruments and assisted with small cameras (laparoscopic surgery).  Your surgeon will carefully cut the veins, arteries, and bronchus leading to your lung. After being cut, each of these pieces will be sewn or stapled closed. The lung or part of the lung will then be removed.  Your surgeon will check inside your chest to make sure there is no bleeding in or around the lungs. Lymph nodes near the lung may also be removed for later tests.  Your surgeon may put tubes into your chest to drain extra fluid and air after surgery.  Your incision will be closed. This may be done using:  Stitches that absorb into your body and do not need to be removed.  Stitches that must be removed.  Staples that must be removed. AFTER THE PROCEDURE   You will be taken to the recovery area and your progress will be monitored. You may still have a breathing tube and other tubes or catheters in your body immediately after surgery. These will be removed during your recovery. You may be put on a respirator following surgery if some assistance is needed to help your breathing. When you are awake and not experiencing immediate problems from surgery, you will be moved to the intensive care unit (ICU) where you will continue your recovery.  You may feel pain in your chest and throat. Sometimes during recovery, patients may shiver or feel nauseous. You will be given medicine to help with pain and nausea.  The breathing tube will be taken out  as soon as your health care providers feel  you can breathe on your own. For most people, this happens on the same day as the surgery.  If your surgery and time in the ICU go well, most of the tubes and equipment will be taken out within 1-2 days after surgery. This is about how long most people stay in the ICU. You may need to stay longer, depending on how you are doing.  You should also start respiratory therapy in the ICU. This therapy uses breathing exercises to help your other lung stay healthy and get stronger.  As you improve, you will be moved to a regular hospital room for continued respiratory therapy, help with your bladder and bowels, and to continue medicines.  After your lung or part of your lung is taken out, there will be a space inside your chest. This space will often fill up with fluid over time. The amount of time this takes is different for each person.  You will receive care until you are doing well and your health care provider feels it is safe for you to go home or to transfer to an extended care facility.   This information is not intended to replace advice given to you by your health care provider. Make sure you discuss any questions you have with your health care provider.   Document Released: 03/27/2002 Document Revised: 01/25/2014 Document Reviewed: 02/23/2013 Elsevier Interactive Patient Education 2016 Else[employer] Inc[employer]  Video-assisted thoracic surgery (VATS) is a procedure that allows your caregiver to look inside your chest and do some minor operations. VATS is commonly done to:  Study or diagnose problems in the chest.  Take a tissue sample (biopsy).   Put medications directly into the chest.   Remove collections of fluid, pus, or blood.  Remove tumors. VATS is done using thoracoscopy. Thoracoscopy is a procedure in which a thin, lighted tube (thoracoscope) is put through a small surgical cut (incision)inyour chest wall. The thoracoscope used for VATS has a video camera on it. It allows your  caregiver to see the inside of your chest on a television screen. LET YOUR CAREGIVER KNOW ABOUT:   Any allergies you have.  All medications you are taking, including vitamins, herbs, eyedrops, and over-the-counter medications and creams.  Use of steroids (by mouth or creams).   Previous problems you or members of your family have had with the use of anesthetics.  Any blood disorders you have had.  Any blood thinners, like warfarin, that you take.  Previous surgery.   Any history of heart problems.  Other health problems you RISKS AND COMPLICATIONS  Severe bleeding (hemorrhage).  Injury to nerves or other structures in the chest.   Lung infection.  The chest may need to be opened with a large incision (thoracotomy) if the procedure cannot be completed safely with the thoracoscope. BEFORE THE PROCEDURE   Tests such as blood tests, urine tests, chest X-rays, and electrocardiography may be done.  Follow your caregiver's instructions if you are taking dietary supplements or medications. Your caregiver may tell you to stop taking them or to reduce your dosage.  Do not take new dietary supplements or medications within 1 week of your procedure unless your caregiver approves them.  Do not eat or drink within 8 hours of your procedure or as directed by your caregiver.  Do not smoke for as long as possible before your procedure. If possible, stop smoking 3-6 weeks before the procedure. PROCEDURE   You will  be given a medication that makes you sleep (general anesthetic) through a mask, through an intravenous (IV) access tube, or through both. This medication will prevent you from being alert and feeling pain during the procedure.Once you are asleep, abreathing tube or mask may be used to help you breathe.  A video thoracoscope will be placed in a very small incision (less than an inch wide), so that your caregiver can see into your chest. The picture from inside the chest will  be displayed on a television screen.  Other surgical tools will be inserted through the remaining incisions.  One of your lungs will be deflated as the surgery is performed. Deflating a lung makes it easier for your caregiver to see the area. The lung will be inflated at the end of the procedure.  The tools will be removed when your caregiver has finished performing the examination or procedure.  A chest tube will be inserted through an incision to drain the wound. The remaining incisions will be closed with stitches or staples. AFTER THE PROCEDURE  Chest tubes are watched closely for signs of fluid or air buildup in the lungs. Your caregiver will most likely remove them prior to discharge.  You will need to stay in the hospital for 1-5 days. The average time in the hospital with VATS surgery is usually several days less than with standard open surgery.  You may receive medications to help with pain.  It is normal to feel sore for about 2 weeks after the procedure.   This information is not intended to replace advice given to you by your health care provider. Make sure you discuss any questions you have with your health care provider.   Document Released: 05/01/2012 Document Reviewed: 05/01/2012 Elsevier Interactive Patient Education [employer].

## 2015-11-27 NOTE — Progress Notes (Addendum)
Andrew Guerra [address]       [place] [address]             [phone]                    Andrew Guerra Andrew Guerra Medical Record #[id number] Date of Birth: 03/26/1940  Referring: [name], MD Primary Care: Sallee Lange, MD  Chief Complaint:  Lung nodule    History of Present Illness:    Andrew Guerra 75 y.o. male is seen in [facility] office  today  with a   history of a pulmonary nodule being monitored with routine CT imaging.  CT Chest on 11/11/2015 confirmed  continued growth of a right upper lobe pulmonary nodule, now 17 mm in diameter, consistent with early stage bronchogenic carcinoma, but without tissue diagnosis.  No mediastinal adenopathy seen. PET scan on 11/25/2015 showed th nodule within the base of the right upper lobe is intensely hypermetabolic compatible with primary bronchogenic carcinoma. There was also a second focus of malignant range FDG uptake localizing to the anterior medial left upper lobe where there is a nodular density within a background of scarring. There was no evidence for hypermetabolic hilar or mediastinal adenopathy or distant metastatic disease. He comes today to review the findings and to discuss the options for radiotherapy.    Current Activity/ Functional Status:  Patient is independent with mobility/ambulation, transfers, ADL's, IADL's.   Zubrod Score: At the time of surgery this patient's most appropriate activity status/level should be described as: '[]'$     0    Normal activity, no symptoms '[x]'$     1    Restricted in physical strenuous activity but ambulatory, able to do out light work '[]'$     2    Ambulatory and capable of self care, unable to do work activities, up and about               >50 % of waking hours                              '[]'$     3    Only limited self care, in bed greater than 50% of waking hours '[]'$     4    Completely disabled, no self care, confined to bed or chair '[]'$     5    Moribund   Past Medical History:    Diagnosis Date  . Cataract    right eye - surgery to remove  . Full dentures   . Glaucoma   . Heart murmur    since childhood, never has caused any problems  . Hypertension   . Sickle cell trait ([name])    no problems per patient   No previous history    Past Surgical History:  Procedure Laterality Date  . cataract eye surgery     right eye  . COLONOSCOPY  11/2009   hx polyps/[place]  . right eye surgery     to lower eye pressure  . TONSILLECTOMY    . wisdom tteeth ext      Family History  Problem Relation Age of Onset  . Diabetes Mother   . Colon cancer Neg Hx   . Colon polyps Neg Hx   . Esophageal cancer Neg Hx   . Rectal cancer Neg Hx   . Stomach cancer Neg Hx    Family history positive for father died  at age 17 of lung cancer, mother died age 35 of MI in 73, sister with DM alive age 51 2 children healthy  Social History   Social History  . Marital status: Married    Spouse name: N/A  . Number of children: N/A  . Years of education: N/A   Occupational History  . Worked in [employer]    Social History Main Topics  . Smoking status: Former Smoker    Packs/day: 1.00    Types: Cigarettes    Quit date: 01/19/2011  . Smokeless tobacco: Never Used  . Alcohol use 0.6 oz/week    1 Shots of liquor per week  . Drug use: No  . Sexual activity: Not on file   Other Topics Concern  . Not on file   Social History Narrative  . No narrative on file    History  Smoking Status  . Former Smoker  . Packs/day: 1.00  . Types: Cigarettes  . Quit date: 01/19/2011  Smokeless Tobacco  . Never Used    History  Alcohol Use  . 0.6 oz/week  . 1 Shots of liquor per week     No Known Allergies  Current Outpatient Prescriptions  Medication Sig Dispense Refill  . amLODipine (NORVASC) 10 MG tablet Take 1 tablet (10 mg total) by mouth daily. 90 tablet 1  . Brinzolamide-Brimonidine (SIMBRINZA) 1-0.2 % SUSP Apply to eye.    . Flaxseed, Linseed, (FLAX  SEEDS) POWD Take by mouth.    . hydrochlorothiazide (HYDRODIURIL) 25 MG tablet Take 1 tablet (25 mg total) by mouth daily. 90 tablet 1  . metoprolol succinate (TOPROL-XL) 50 MG 24 hr tablet Take 1 tablet (50 mg total) by mouth daily. Take with or immediately following a meal. 90 tablet 1  . Multiple Vitamin (MULTIVITAMIN) tablet Take 1 tablet by mouth daily.    . timolol (BETIMOL) 0.5 % ophthalmic solution 1 drop 2 (two) times daily.    [name] XIIDRA 5 % SOLN PLACE 1 DROP INTO EACH EYE TWICE A DAY  6   No current facility-administered medications for this visit.       Review of Systems:     Cardiac Review of Systems: Y or N  Chest Pain [   n ]  Resting SOB [ n  ] Exertional SOB  [ y ]  Vertell Limber Florencio.Farrier  ]   Pedal Edema [ n  ]    Palpitations [ n ] Syncope  [  n]   Presyncope [ n  ]  General Review of Systems: [Y] = yes [  ]=no Constitional: recent weight change [  ];  Wt loss over the last 3 months [   ] anorexia [  ]; fatigue [  ]; nausea [  ]; night sweats [  ]; fever [  ]; or chills [  ];          Dental: poor dentition[  ]; Last Dentist visit:   Eye : blurred vision [  ]; diplopia [   ]; vision changes [  ];  Amaurosis fugax[  ]; Resp: cough Blue.Reese  ];  wheezing[ n ];  hemoptysis[n  ]; shortness of breath[  ]; paroxysmal nocturnal dyspnea[  ]; dyspnea on exertion[ y ]; or orthopnea[  ];  GI:  gallstones[  ], vomiting[  ];  dysphagia[  ]; melena[n  ];  hematochezia [  ]; heartburn[  ];   Hx of  Colonoscopy[y  ]; GU: kidney stones [  ];  hematuria[  ];   dysuria [  ];  nocturia[  ];  history of     obstruction [  ]; urinary frequency [  ]             Skin: rash, swelling[  ];, hair loss[  ];  peripheral edema[  ];  or itching[  ]; Musculosketetal: myalgias[  ];  joint swelling[  ];  joint erythema[  ];  joint pain[  ];  back pain[  ];  Heme/Lymph: bruising[  ];  bleeding[  ];  anemia[  ];  Neuro: TIA[  ];  headaches[  ];  stroke[  ];  vertigo[  ];  seizures[  ];   paresthesias[  ];  difficulty  walking[  ];  Psych:depression[  ]; anxiety[  ];  Endocrine: diabetes[ n ];  thyroid dysfunction[ n ];  Immunizations: Flu up to date [ n ]; Pneumococcal up to date [ y ];  Other:  Physical Exam: BP (!) 149/77   Pulse 70   Temp 98.7 F (37.1 C)   Resp 18   Wt 203 lb 12.3 oz (92.4 kg)   SpO2 100%   BMI 28.82 kg/m   PHYSICAL EXAMINATION: General appearance: alert, cooperative and appears older than stated age Head: Normocephalic, without obvious abnormality, atraumatic Neck: no adenopathy, no carotid bruit, no JVD, supple, symmetrical, trachea midline and thyroid not enlarged, symmetric, no tenderness/mass/nodules Lymph nodes: Cervical, supraclavicular, and axillary nodes normal. Resp: clear to auscultation bilaterally Back: symmetric, no curvature. ROM normal. No CVA tenderness. Cardio: regular rate and rhythm, S1, S2 normal, click, rub or gallop, 2/6 early systolic  murmur at right sternal border  GI: soft, non-tender; bowel sounds normal; no masses,  no organomegaly Extremities: extremities normal, atraumatic, no cyanosis or edema Neurologic: Grossly normal  Diagnostic Studies & Laboratory data:     Recent Radiology Findings:   Ct Chest Wo Contrast  Result Date: 11/11/2015 CLINICAL DATA:  Follow-up pulmonary nodule. EXAM: CT CHEST WITHOUT CONTRAST TECHNIQUE: Multidetector CT imaging of the chest was performed following the standard protocol without IV contrast. COMPARISON:  02/04/2015 FINDINGS: Cardiovascular: No cardiomegaly or pericardial effusion. Aortic valvular calcifications/ sclerosis that is at least moderate. Subendocardial fatty density along the anterior wall left ventricle. Scattered atherosclerotic calcification. Mediastinum/Nodes: Calcified right mediastinal lymph nodes consistent with remote granulomatous disease. No suspicious noncalcified lymph nodes. Right thyroid cyst noted 02/14/2015 remains predominantly decompressed. Lungs/Pleura: Continued enlargement of a  lobulated pulmonary nodule in the right upper lobe, now up to 17 mm. The nodule has broad contact with the minor fissure. 3 mm pulmonary nodule in the right upper lobe on 2:65 is stable. Stable flat reticulated opacity in the paramediastinal right upper lobe. Scattered calcified granulomas. Centrilobular emphysema. Upper Abdomen: Bilateral renal lesions which are being followed. Negative adrenal glands. Musculoskeletal: No acute or aggressive finding These results will be called to the ordering clinician or representative by the Radiologist Assistant, and communication documented in the PACS or zVision Dashboard. IMPRESSION: 1. Continued growth of a right upper lobe pulmonary nodule, now 17 mm in diameter, a bronchogenic carcinoma unless proven otherwise. No mediastinal adenopathy. 2. 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 3. At least moderate aortic valve calcifications/sclerosis. 4. Centrilobular emphysema. Electronically Signed   By: Monte Fantasia M.D.   On: 11/11/2015 15:38   Nm Pet Image Initial (pi) Skull Base To Thigh  Result Date: 11/26/2015 CLINICAL DATA:  Initial treatment strategy for pulmonary nodule. EXAM: NUCLEAR MEDICINE PET SKULL  BASE TO THIGH TECHNIQUE: 10.03 mCi F-18 FDG was injected intravenously. Full-ring PET imaging was performed from the skull base to thigh after the radiotracer. CT data was obtained and used for attenuation correction and anatomic localization. FASTING BLOOD GLUCOSE:  Value: 90 mg/dl COMPARISON:  MRI from 09/26/2015 and chest CT 11/11/2015 FINDINGS: NECK No hypermetabolic lymph nodes in the neck. CHEST No hypermetabolic mediastinal or hilar nodes. Aortic atherosclerosis. 1.7 cm right upper lobe pulmonary nodule SUV max equals 13.55. Subpleural nodular density within the anterior medial left upper lobe is hypermetabolic with an SUV max equal to 7.4. ABDOMEN/PELVIS No abnormal hypermetabolic activity within the liver, pancreas, adrenal  glands, or spleen. Aortic atherosclerosis. Infrarenal abdominal aorta measures 2.6 cm, image 136 series 4. Complex cysts within the right kidney are again noted, image 104 of series 4 and image 114 of series 4. These are better described on contrast enhanced MRI from 09/26/2015. Prostate gland appears enlarged. No hypermetabolic lymph nodes in the abdomen or pelvis. SKELETON No focal hypermetabolic activity to suggest skeletal metastasis. IMPRESSION: 1. Nodule within the base of the right upper lobe is intensely hypermetabolic compatible with primary bronchogenic carcinoma. Correlation with tissue sampling is advised. 2. There is a second focus of malignant range FDG uptake uptake localizing to the anterior medial left upper lobe where there is a nodular density within a background of scarring. A second small pulmonary neoplasm cannot be excluded. 3. No evidence for hypermetabolic hilar or mediastinal adenopathy or distant metastatic disease. 4. Complex lesions within the right kidney are better evaluated on recent contrast enhanced MRI of the abdomen from 09/26/2015. 5. Aortic atherosclerosis. Ectatic abdominal aorta at risk for aneurysm development. Recommend followup by ultrasound in 5 years. This recommendation follows ACR consensus guidelines: White Paper of the ACR Incidental Findings Committee II on Vascular Findings. J Am Coll Radiol 2013; 10:789-794. Electronically Signed   By: Kerby Moors M.D.   On: 11/26/2015 08:50   I have independently reviewed the above radiology studies  and reviewed the findings with the patient.  I have reviewed the PET with Dr Clovis Riley. The formal report is to be corrected , no mass on the left. Second spot at the apex of right.  Recent Lab Findings: Lab Results  Component Value Date   GLUCOSE 91 10/27/2015   CHOL 162 10/27/2015   TRIG 68 10/27/2015   HDL 43 10/27/2015   LDLCALC 105 (H) 10/27/2015   ALT 16 10/27/2015   AST 21 10/27/2015   NA 141 10/27/2015   K 3.9  10/27/2015   CL 99 10/27/2015   CREATININE 1.39 (H) 10/27/2015   BUN 24 10/27/2015   CO2 29 10/27/2015   TSH 2.416 01/04/2014      Assessment / Plan:   1/ Nodule within the base of the right upper lobe is intensely hypermetabolic compatible with primary bronchogenic carcinoma 2/ history of rheumatic Fever  with murmur of AS   3/ suggestion of previous mi on/ ct/pet  4/ correction to formal xray report no  hypermetabolic lesion in left lung  I have discussed with the patient option of surgical resection for what appears clinically as Stage I lung ca. He would like to think about options and has talked to Dr Tammi Klippel. He will call office back in next couple of days and if wishes to consider surgical resection will get PFT and cardiology evaluation .  I  spent 40 minutes counseling the patient face to face and 50% or more the  time was  spent in counseling and coordination of care. The total time spent in the appointment was 60 minutes.  Grace Isaac MD      [address] [place] [address] Office [phone]   Beeper [phone]  11/28/2015 1:20 PM

## 2015-11-27 NOTE — Therapy (Signed)
[place], [place], [address] Phone: [phone]   Fax:  [phone]  Physical Therapy Evaluation  Patient Details  Name: PAMELA MADDY MRN: [id number] Date of Birth: March 01, 1940 Referring Provider: Dr. Tyler Pita  Encounter Date: 11/27/2015      PT End of Session - 11/27/15 1541    Visit Number 1   Number of Visits 1   PT Start Time 3557   PT Stop Time 1533   PT Time Calculation (min) 20 min   Activity Tolerance Patient tolerated treatment well   Behavior During Therapy [facility] for tasks assessed/performed      Past Medical History:  Diagnosis Date  . Cataract    right eye - surgery to remove  . Full dentures   . Glaucoma   . Heart murmur    since childhood, never has caused any problems  . Hypertension   . Sickle cell trait ([place])    no problems per patient    Past Surgical History:  Procedure Laterality Date  . cataract eye surgery     right eye  . COLONOSCOPY  11/2009   hx polyps/[place]  . right eye surgery     to lower eye pressure  . TONSILLECTOMY    . wisdom tteeth ext      There were no vitals filed for this visit.       Subjective Assessment - 11/27/15 1529    Subjective "I haven't met with the doctor yet and don't know what they've found or anything."   Patient is accompained by: Family member  wife   Pertinent History Patient had been followed for h/o a pulmonary nodule.  CT and PET indicated a right upper lobe 1.7 cm. nodule.  Pt. also has kidney nodules and is being followed separately for that.  There is no pathology yet from tissue, but patient is expected to have resection or possibly XRT, as it appears to be stage I. Ex-smoker who quit 2013; HTN; glaucoma; heart murmur.   Patient Stated Goals get info from all lung clinic providers   Currently in Pain? No/denies            [facility] PT Assessment - 11/27/15 0001      Assessment   Medical Diagnosis right  upper lobe nodule, no pathology yet   Referring Provider Dr. Tyler Pita   Onset Date/Surgical Date 11/11/15     Precautions   Precautions Other (comment)   Precaution Comments cancer precautions     Restrictions   Weight Bearing Restrictions No     Balance Screen   Has the patient fallen in the past 6 months No   Has the patient had a decrease in activity level because of a fear of falling?  No   Is the patient reluctant to leave their home because of a fear of falling?  No     Home Ecologist residence   Living Arrangements Spouse/significant other   Type of Home Other(Comment)  [place]     Prior Function   Level of Independence Independent   [facility] and rides stationary bike 3 mmiles a day and does some weight lifting; this takes about 45 minutes     Cognition   Overall Cognitive Status Within Functional Limits for tasks assessed     Observation/Other Assessments   Observations well looking gentleman accompanied by his  wife     Functional Tests   Functional tests Sit to Stand     Sit to Stand   Comments 13 times in 30 seconds, good for his age     Posture/Postural Control   Posture/Postural Control Postural limitations   Postural Limitations Rounded Shoulders;Forward head  slight, and variable; stands tall for assessment     ROM / Strength   AROM / PROM / Strength AROM     AROM   Overall AROM Comments standing trunk AROM WFL all directions     Ambulation/Gait   Ambulation/Gait Yes   Ambulation/Gait Assistance 7: Independent     Balance   Balance Assessed Yes     Dynamic Standing Balance   Dynamic Standing - Comments reaches 13 inches forward in standing, about average for age                           PT Education - 11/27/15 1540    Education provided Yes   Education Details energy conservation, walking or stationary biking, C[employer] article on  staying active, posture, breathing, PT info   Person(s) Educated Patient;Spouse   Methods Explanation;Handout   Comprehension Verbalized understanding               Lung Clinic Goals - 11/27/15 1650      Patient will be able to verbalize understanding of the benefit of exercise to decrease fatigue.   Status Achieved     Patient will be able to verbalize the importance of posture.   Status Achieved     Patient will be able to demonstrate diaphragmatic breathing for improved lung function.   Status Achieved     Patient will be able to verbalize understanding of the role of physical therapy to prevent functional decline and who to contact if physical therapy is needed.   Status Achieved             Plan - 11/27/15 1541    Clinical Impression Statement Patient who is currently doing well, staying [facility] [employer] [facility], but with a lung nodule as well as a kidney nodule.   Rehab Potential Excellent   PT Frequency One time visit   PT Treatment/Interventions Patient/family education   PT Next Visit Plan None at this time   PT Home Exercise Plan stationary biking, breathing   Consulted and Agree with Plan of Care Patient      Patient will benefit from skilled therapeutic intervention in order to improve the following deficits and impairments:  Other (comment) (no current needs)  Visit Diagnosis: Abnormal posture - Plan: PT plan of care cert/re-cert      G-Codes - 63/84/66 1650    Functional Assessment Tool Used clinical judgement   Functional Limitation Changing and maintaining body position   Changing and Maintaining Body Position Current Status (Z9935) At least 1 percent but less than 20 percent impaired, limited or restricted   Changing and Maintaining Body Position Goal Status (T0177) At least 1 percent but less than 20 percent impaired, limited or restricted   Changing and Maintaining Body Position Discharge Status (L3903) At least 1 percent  but less than 20 percent impaired, limited or restricted       Problem List Patient Active Problem List   Diagnosis Date Noted  . Heart murmur 11/04/2015  . History of active rheumatic fever 11/04/2015  . Solitary pulmonary nodule 08/31/2013  . 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 Thyroid nodule 08/31/2013  . Hyperlipidemia 04/25/2013  . Essential hypertension, benign 06/08/2012    Huie Ghuman 11/27/2015, 4:52 PM  [place], [place], [address] Phone: [phone]   Fax:  [phone]  Name: JEREMEY BASCOM MRN: [id number] Date of Birth: December 09, 1940  Serafina Royals, PT 11/27/15 4:52 PM

## 2015-11-28 ENCOUNTER — Telehealth: Payer: Self-pay | Admitting: Family Medicine

## 2015-11-28 ENCOUNTER — Encounter: Payer: Self-pay | Admitting: *Deleted

## 2015-11-28 NOTE — Telephone Encounter (Signed)
Patient wants to discuss specialist visit and what route he should take next. Tried to schedule appointment for next available date which would have been 11/17 but he wanted to talk to you over the phone stating that was too far off.

## 2015-11-28 NOTE — Progress Notes (Signed)
Oncology Nurse Navigator Documentation  Oncology Nurse Navigator Flowsheets 11/28/2015  Navigator Location [facility]  Navigator Encounter Type Clinic/MDC/spoke with patient and wife yesterday at [facility].  Andrew Guerra does not have tissue DX but looks like he may have lung cancer.  He spoke with radiation oncology and T surgery.  He needs some time to think about his treatment choices.  I stated I would follow up with them at a later time.    Abnormal Finding Date 11/11/2015  Multidisiplinary Clinic Date 11/27/2015  Patient Visit Type MedOnc  Treatment Phase Pre-Tx/Tx Discussion  Barriers/Navigation Needs Education  Education Other  Interventions Education  Education Method Verbal  Acuity Level 2  Acuity Level 2 Other  Time Spent with Patient 30

## 2015-11-30 NOTE — Telephone Encounter (Signed)
Please call Andrew Guerra. I will be happy to make time for him Monday afternoon or another option would be Tuesday morning or afternoon if he was willing to come in at 11:20 on Tuesday that would allow [name] to have a good amount of time to discuss this. Please let him know I am very interested in helping him. I will would prefer this to be done face-to-face if he could come in on Monday or Tuesday.

## 2015-12-01 ENCOUNTER — Encounter: Payer: Self-pay | Admitting: Family Medicine

## 2015-12-01 ENCOUNTER — Ambulatory Visit (INDEPENDENT_AMBULATORY_CARE_PROVIDER_SITE_OTHER): Payer: Medicare Other | Admitting: Family Medicine

## 2015-12-01 VITALS — BP 130/72 | Ht 70.5 in | Wt 204.0 lb

## 2015-12-01 DIAGNOSIS — R911 Solitary pulmonary nodule: Secondary | ICD-10-CM

## 2015-12-01 DIAGNOSIS — I2510 Atherosclerotic heart disease of native coronary artery without angina pectoris: Secondary | ICD-10-CM | POA: Diagnosis not present

## 2015-12-01 NOTE — Telephone Encounter (Signed)
Spoke with patient and informed her per Dr.Scott Luking-  Dr.Scott  will be happy to make time for you  Today   or another option would be Tuesday morning or afternoon if you are  was willing to come in at 11:20 on Tuesday that would allow [name] to have a good amount of time to discuss this. Dr.Scott is  very interested in helping you . Dr.Scott  would prefer this to be done face-to-face if you  could come in today or tomorrow. Patient verbalized understanding and was scheduled for an appointment today.

## 2015-12-01 NOTE — Progress Notes (Signed)
Subjective:     Patient ID: Andrew Guerra, male   DOB: 11/25/1940, 75 y.o.   MRN: [id number]  HPI Patient is here today to discuss his recent specialist visit with Dr. Nicki Reaper. Patient has no other concerns at this time.  Long discussion held with the patient regarding his cancer. More than likely the patient does have lung cancer based on the PET scan. The patient discussed with me the findings from the surgeon as well as radiation therapist. He also talked openly about his concerns and worries regarding surgery. He is interested in making sure that whatever choice he makes is appropriate for him. Patient no longer smokes denies hemoptysis denies fever or wheezing difficulty breathing 15 to 20 minutes spent with patient discussing these issues. Review of Systems More than likely patient does have stage I lung cancer    Objective:   Physical Exam     Assessment:     Lung nodule probable lung cancer    Plan:     Patient is uncertain know what he would like to do. He is worried about surgery because he feels like a large portion of is lung will be removed. He also is uncertain about radiation therapy regarding if it would adequately take care of the cancer. We are looking into a couple additional options The patient will need cardiology referral for preoperative clearance more than likely will need a stress test and echo/possible Myoview Patient will also need pulmonary function testing  After discussion with the patient we will be discussing the case with Dr Elenor Quinones at [facility]. The patient would feel most comfortable getting a second opinion regarding his issue then he will make decision regarding the path he will take  Patient was initially reluctant to pursue any testing back when he had a pulmonary nodule. I think it is very important to make sure that this patient feels 100% comfortable with the path being taken so that he has full buy-in. I did express to the patient the importance  of coming to a conclusion within the next few weeks of what he would like to do.

## 2015-12-02 ENCOUNTER — Ambulatory Visit: Payer: Medicare Other | Admitting: Family Medicine

## 2015-12-02 ENCOUNTER — Encounter: Payer: Self-pay | Admitting: *Deleted

## 2015-12-02 LAB — PULMONARY FUNCTION TEST
DL/VA % PRED: 59 %
DL/VA: 2.69 ml/min/mmHg/L
DLCO UNC % PRED: 41 %
DLCO unc: 12.88 ml/min/mmHg
FEF 25-75 POST: 2.46 L/s
FEF 25-75 PRE: 0.97 L/s
FEF2575-%CHANGE-POST: 153 %
FEF2575-%PRED-POST: 117 %
FEF2575-%PRED-PRE: 46 %
FEV1-%CHANGE-POST: 17 %
FEV1-%Pred-Post: 75 %
FEV1-%Pred-Pre: 63 %
FEV1-Post: 1.97 L
FEV1-Pre: 1.67 L
FEV1FVC-%CHANGE-POST: 10 %
FEV1FVC-%PRED-PRE: 95 %
FEV6-%CHANGE-POST: 9 %
FEV6-%Pred-Post: 73 %
FEV6-%Pred-Pre: 67 %
FEV6-PRE: 2.28 L
FEV6-Post: 2.48 L
FEV6FVC-%Change-Post: 2 %
FEV6FVC-%PRED-PRE: 103 %
FEV6FVC-%Pred-Post: 106 %
FVC-%CHANGE-POST: 6 %
FVC-%PRED-POST: 69 %
FVC-%Pred-Pre: 65 %
FVC-Post: 2.48 L
FVC-Pre: 2.33 L
POST FEV1/FVC RATIO: 79 %
Post FEV6/FVC ratio: 100 %
Pre FEV1/FVC ratio: 71 %
Pre FEV6/FVC Ratio: 97 %
RV % PRED: 137 %
RV: 3.48 L
TLC % pred: 89 %
TLC: 6.13 L

## 2015-12-02 NOTE — Addendum Note (Signed)
Addended by: Ofilia Neas R on: 12/02/2015 11:31 AM   Modules accepted: Orders

## 2015-12-02 NOTE — Progress Notes (Signed)
Pulmonary function test ordered in epic for Andrew Guerra per patient's request. Respiratory will contact patient with appointment information regarding PFT.

## 2015-12-02 NOTE — Progress Notes (Signed)
Oncology Nurse Navigator Documentation  Oncology Nurse Navigator Flowsheets 12/02/2015  Navigator Encounter Type Other/I received a message from Dr. Wolfgang Phoenix.  Andrew Guerra is still unsure of how to proceed with treatment.  He is going to [facility] for a second opinion.  I will follow up with Andrew Guerra after appt.   Treatment Phase Pre-Tx/Tx Discussion  Barriers/Navigation Needs Coordination of Care  Interventions Coordination of Care  Coordination of Care Appts  Acuity Level 1  Time Spent with Patient 15

## 2015-12-03 NOTE — Telephone Encounter (Signed)
Please be aware that I placed a call to Dr. Elenor Quinones. We will try to connect with him Monday, November 20 when he comes back from his trip

## 2015-12-09 ENCOUNTER — Telehealth: Payer: Self-pay | Admitting: Family Medicine

## 2015-12-09 NOTE — Telephone Encounter (Signed)
Long discussion held with the patient regarding my conversation with Dr. Elenor Quinones at [facility]. Patient is on board with getting further opinion. Patient states he is leaning more towards doing the surgery at [facility]. I did speak with Dr. Elenor Quinones who will be having his office call the patient to set up an office visit for next week. The patient has pulmonary function test at the hospital tomorrow and he will get a copy of his PET scan and his CAT scan on CD-ROM to bring with him to do. The patient understands if he has not heard from [facility] by Monday he will call us. Our office will send information regarding the patient's referral to Dr. Keturah Barre Amico's assistant.

## 2015-12-10 ENCOUNTER — Ambulatory Visit (HOSPITAL_COMMUNITY)
Admission: RE | Admit: 2015-12-10 | Discharge: 2015-12-10 | Disposition: A | Discharge status: Home / Self Care | Payer: Medicare Other | Source: Ambulatory Visit | Attending: Family Medicine | Admitting: Family Medicine

## 2015-12-10 ENCOUNTER — Telehealth: Payer: Self-pay | Admitting: *Deleted

## 2015-12-10 DIAGNOSIS — Z029 Encounter for administrative examinations, unspecified: Secondary | ICD-10-CM | POA: Insufficient documentation

## 2015-12-10 MED ORDER — ALBUTEROL SULFATE (2.5 MG/3ML) 0.083% IN NEBU
2.5000 mg | INHALATION_SOLUTION | Freq: Once | RESPIRATORY_TRACT | Status: AC
  Administered 2015-12-10: 2.5 mg via RESPIRATORY_TRACT

## 2015-12-10 NOTE — Telephone Encounter (Signed)
Oncology Nurse Navigator Documentation  Oncology Nurse Navigator Flowsheets 12/10/2015  Navigator Encounter Type Telephone/I called to check on Andrew Guerra.  He has an appt at [facility] on 12/18/15 for a second opinion.  Andrew Guerra is still unsure of how to proceed with treatment.  I will notify Dr. Servando Snare and Dr. Tammi Klippel.   Telephone Outgoing Call  Treatment Phase Pre-Tx/Tx Discussion  Barriers/Navigation Needs Coordination of Care  Interventions Coordination of Care  Coordination of Care Appts  Acuity Level 2  Time Spent with Patient 30

## 2015-12-14 NOTE — Telephone Encounter (Signed)
I did speak with Dr. Elenor Quinones. Also spoke with the patient. Dr. Keturah Barre Amico's office will be calling the patient to schedule the consultation at [facility]. The patient is aware of all this.

## 2015-12-18 ENCOUNTER — Telehealth: Payer: Self-pay | Admitting: Family Medicine

## 2015-12-18 DIAGNOSIS — C3411 Malignant neoplasm of upper lobe, right bronchus or lung: Secondary | ICD-10-CM | POA: Insufficient documentation

## 2015-12-18 DIAGNOSIS — I10 Essential (primary) hypertension: Secondary | ICD-10-CM | POA: Insufficient documentation

## 2015-12-18 DIAGNOSIS — Z01818 Encounter for other preprocedural examination: Secondary | ICD-10-CM

## 2015-12-18 DIAGNOSIS — N400 Enlarged prostate without lower urinary tract symptoms: Secondary | ICD-10-CM | POA: Insufficient documentation

## 2015-12-18 NOTE — Telephone Encounter (Signed)
I am unsure if this patient can get echo done before his surgery on Friday. This is done through cardiology. Nurse's-first thing-please call Alphonsus Sias at heart care office in [place]-explained to her that this patient is having lung surgery next Friday. If she is not available please talk with one of the coordinators at the heart care center in the hospital in [place]. Izora Ribas may be able to help you with this as well. Currently the doctors at [facility] 1 him to have a echo before this surgery. Ideally it would need to be done ASAP. If possible to be done Monday or Tuesday (I suppose Wednesday if necessary )so that it could be read in time. Is there any way that cardiology can do a echo on this short of notice? What this we may well require is obviously a referral as well. If for some reason this cannot be done within this timeframe then it is necessary to call [place] at [facility] to discuss this with her to see if she can get it done at [facility]. This is extremely important that this be handled on Friday. Obviously we need to be able to have a guarantee that the echo can be scheduled early in the week in that the patient is notified of this or the nurse practitioner would [facility] must be spoken with on Friday. If you need my input you can call me on my cell phone otherwise I am out of [facility] on Friday.. Thank you for your input and help

## 2015-12-18 NOTE — Telephone Encounter (Signed)
Patient is having surgery next Friday and he is needing an echocardiogram before this is done.  Almyra Free is wanting to know if we can order for him to have this done in [place]?

## 2015-12-19 ENCOUNTER — Telehealth: Payer: Self-pay | Admitting: Family Medicine

## 2015-12-19 NOTE — Telephone Encounter (Signed)
Patient wanted to let Dr. Nicki Reaper know that he is scheduled for surgery on 12/26/15 at [facility].  If you have any questions, please feel free to call him.  He said he met with admissions yesterday and everything went very well.

## 2015-12-19 NOTE — Telephone Encounter (Signed)
Left message to return call with Alphonsus Sias at heart care office in [place].

## 2015-12-19 NOTE — Telephone Encounter (Signed)
Almyra Free with [facility] called back and stating that she will also call [facility] heartcare on Monday and see if she can set up the Echo but she would still like for [name] to follow up with it.

## 2015-12-19 NOTE — Telephone Encounter (Signed)
Tried contacting [employer] again this afternoon and received the voicemail again in which we left a message. Also Contacted heart care and they were closed today for a staff meeting and will not reopen until Monday. I left message with Almyra Free at [facility] awaiting response.

## 2015-12-22 ENCOUNTER — Telehealth: Payer: Self-pay | Admitting: *Deleted

## 2015-12-22 NOTE — Telephone Encounter (Signed)
Oncology Nurse Navigator Documentation  Oncology Nurse Navigator Flowsheets 12/22/2015  Navigator Encounter Type Telephone/Andrew Guerra is going to have lung surgery at [facility] on 12/26/15.  I called Andrew Guerra but was unable to reach.  I asked that he call if he needed anything.  I will up date Dr. Servando Snare on patient's treatment plan.   Telephone Outgoing Call  Treatment Phase Pre-Tx/Tx Discussion  Acuity Level 1  Time Spent with Patient 15

## 2015-12-22 NOTE — Telephone Encounter (Signed)
Echo scheduled for 12/23/15 at 11:30 am at [facility]. Patient was notified and Almyra Free at [place] was notified also.

## 2015-12-23 ENCOUNTER — Ambulatory Visit (HOSPITAL_COMMUNITY)
Admission: RE | Admit: 2015-12-23 | Discharge: 2015-12-23 | Disposition: A | Discharge status: Home / Self Care | Payer: Medicare Other | Source: Ambulatory Visit | Attending: Family Medicine | Admitting: Family Medicine

## 2015-12-23 DIAGNOSIS — I081 Rheumatic disorders of both mitral and tricuspid valves: Secondary | ICD-10-CM | POA: Insufficient documentation

## 2015-12-23 DIAGNOSIS — R011 Cardiac murmur, unspecified: Secondary | ICD-10-CM | POA: Diagnosis not present

## 2015-12-23 DIAGNOSIS — Z01818 Encounter for other preprocedural examination: Secondary | ICD-10-CM | POA: Insufficient documentation

## 2015-12-23 DIAGNOSIS — I700 Atherosclerosis of aorta: Secondary | ICD-10-CM | POA: Diagnosis not present

## 2015-12-23 NOTE — Progress Notes (Signed)
*  PRELIMINARY RESULTS* Echocardiogram 2D Echocardiogram has been performed.  Andrew Guerra 12/23/2015, 12:09 PM

## 2015-12-24 ENCOUNTER — Telehealth: Payer: Self-pay | Admitting: Radiation Oncology

## 2015-12-24 NOTE — Telephone Encounter (Signed)
I did discuss the patient's surgery as well as his echo with the patient. Please make sure a copy of the echo was faxed to the nurse practitioner Almyra Free. Call Almyra Free make sure she knows that the echo was completed and that we can fax a result. Echo is available in Epic as Welp.(Check with Dewitt Hoes this may have Arty been completed)

## 2015-12-24 NOTE — Telephone Encounter (Addendum)
Opened in error. The patient will be having surgery at [facility] for his early stage lung cancer.

## 2015-12-25 NOTE — Telephone Encounter (Signed)
This was done yesterday.  

## 2015-12-26 DIAGNOSIS — I350 Nonrheumatic aortic (valve) stenosis: Secondary | ICD-10-CM | POA: Insufficient documentation

## 2015-12-26 HISTORY — PX: VIDEO ASSISTED THORACOSCOPY (VATS)/ LOBECTOMY: SHX6169

## 2015-12-30 ENCOUNTER — Telehealth: Payer: Self-pay | Admitting: Family Medicine

## 2015-12-30 NOTE — Telephone Encounter (Signed)
Please let the patient know that I'm aware that his surgery went well. I am also glad hear from him as well. Let him know I'll be doing a follow-up phone call later this week thanks

## 2015-12-30 NOTE — Telephone Encounter (Signed)
Patient wanted you to know he was released yesterday from [facility] and his lung surgery went well.

## 2015-12-31 NOTE — Telephone Encounter (Signed)
Discussed with pt

## 2016-01-01 NOTE — Telephone Encounter (Signed)
I did talk with the patient his surgery went well he's feeling good certainly we are awaiting Andrew Guerra the pathology comes back he sees a specialist on January 2 then he will call us

## 2016-01-28 ENCOUNTER — Telehealth: Payer: Self-pay | Admitting: Family Medicine

## 2016-01-28 NOTE — Telephone Encounter (Signed)
Pt called stating that he has been released from his surgeon and is wanting to know if there is anything he needs to do. Pt has an appt with [name] in Apr. Please advise.

## 2016-01-28 NOTE — Telephone Encounter (Signed)
Pt's note in Care Everywhere.  Please advise.

## 2016-01-29 NOTE — Telephone Encounter (Signed)
Appt made for 02/09/16 @ 1340. FYI

## 2016-01-29 NOTE — Telephone Encounter (Signed)
Please let the patient know that I reviewed over the notes from his surgeon as well as the note from the oncologist recommending the possibility of chemotherapy. I also reviewed over the aspect that he would like to discuss this with [name] before making a decision. I would recommend a face-to-face visit next week regarding this issue. Assist him with scheduling office visit please

## 2016-02-09 ENCOUNTER — Encounter: Payer: Self-pay | Admitting: Family Medicine

## 2016-02-09 ENCOUNTER — Ambulatory Visit (INDEPENDENT_AMBULATORY_CARE_PROVIDER_SITE_OTHER): Payer: Medicare Other | Admitting: Family Medicine

## 2016-02-09 VITALS — BP 118/76 | Ht 70.5 in | Wt 193.2 lb

## 2016-02-09 DIAGNOSIS — E784 Other hyperlipidemia: Secondary | ICD-10-CM | POA: Diagnosis not present

## 2016-02-09 DIAGNOSIS — Z85118 Personal history of other malignant neoplasm of bronchus and lung: Secondary | ICD-10-CM | POA: Diagnosis not present

## 2016-02-09 DIAGNOSIS — I10 Essential (primary) hypertension: Secondary | ICD-10-CM

## 2016-02-09 DIAGNOSIS — E7849 Other hyperlipidemia: Secondary | ICD-10-CM

## 2016-02-09 NOTE — Progress Notes (Signed)
   Subjective:    Patient ID: Andrew Guerra, male    DOB: Sep 12, 1940, 76 y.o.   MRN: [id number]  HPI Patient arrives for follow up after recent lung surgery. Patient with stage IIb lung cancer treated at [facility]. Patient did remarkably well with the surgery. States his appetite is doing good breathing is doing good denies any chest tightness pressure pain shortness of breath.   Review of Systems Patient denies any cough chest tightness pressure pain shortness breath nausea vomiting diarrhea fever chills    Objective:   Physical Exam Patient had a scab on his right back where the surgery was this was barely hanging on this was trimmed off a Band-Aid was placed lungs are clear hearts regular pulse normal BP is good  Long discussion held with the patient regarding advanced chemotherapy and potential side effects with this. According to the oncologist at [facility] this would only improve his rate of reoccurrence by about 10%. After careful discussion with the patient the patient and his wife do not feel that the marginal increase improvement is worth the risk in taking further treatments. They do not 1 chemotherapy.    25 minutes was spent with patient. Additional information regarding the chemotherapy agents was given to the family for further evaluation. Once again they do not 1 chemotherapy we will honor their wishes. Assessment & Plan:  Lung cancer Follow-up with Dr. Elenor Quinones at [facility] in approximately 5-6 months as previously plan  Follow-up for labs and exam later this spring with [name]  Patient and his wife defer and refused any chemotherapy regarding lung cancer. This seems to be a reasonable statement of their wishes given the marginal benefit of any chemotherapy y

## 2016-04-30 LAB — BASIC METABOLIC PANEL
BUN / CREAT RATIO: 15 (ref 10–24)
BUN: 21 mg/dL (ref 8–27)
CALCIUM: 9.6 mg/dL (ref 8.6–10.2)
CHLORIDE: 100 mmol/L (ref 96–106)
CO2: 27 mmol/L (ref 18–29)
Creatinine, Ser: 1.44 mg/dL — ABNORMAL HIGH (ref 0.76–1.27)
GFR calc non Af Amer: 47 mL/min/{1.73_m2} — ABNORMAL LOW (ref >59)
GFR, EST AFRICAN AMERICAN: 54 mL/min/{1.73_m2} — AB (ref >59)
GLUCOSE: 94 mg/dL (ref 65–99)
POTASSIUM: 4 mmol/L (ref 3.5–5.2)
Sodium: 141 mmol/L (ref 134–144)

## 2016-04-30 LAB — HEPATIC FUNCTION PANEL
ALT: 16 IU/L (ref 0–44)
AST: 21 IU/L (ref 0–40)
Albumin: 4.1 g/dL (ref 3.5–4.8)
Alkaline Phosphatase: 87 IU/L (ref 39–117)
BILIRUBIN TOTAL: 0.2 mg/dL (ref 0.0–1.2)
BILIRUBIN, DIRECT: 0.09 mg/dL (ref 0.00–0.40)
Total Protein: 6.5 g/dL (ref 6.0–8.5)

## 2016-04-30 LAB — CBC WITH DIFFERENTIAL/PLATELET
BASOS ABS: 0 10*3/uL (ref 0.0–0.2)
Basos: 0 %
EOS (ABSOLUTE): 0.3 10*3/uL (ref 0.0–0.4)
Eos: 5 %
Hematocrit: 35.5 % — ABNORMAL LOW (ref 37.5–51.0)
Hemoglobin: 11.7 g/dL — ABNORMAL LOW (ref 13.0–17.7)
Immature Grans (Abs): 0 10*3/uL (ref 0.0–0.1)
Immature Granulocytes: 0 %
LYMPHS ABS: 1.8 10*3/uL (ref 0.7–3.1)
Lymphs: 26 %
MCH: 26.2 pg — AB (ref 26.6–33.0)
MCHC: 33 g/dL (ref 31.5–35.7)
MCV: 80 fL (ref 79–97)
Monocytes Absolute: 0.7 10*3/uL (ref 0.1–0.9)
Monocytes: 10 %
NEUTROS ABS: 4.2 10*3/uL (ref 1.4–7.0)
Neutrophils: 59 %
PLATELETS: 239 10*3/uL (ref 150–379)
RBC: 4.46 x10E6/uL (ref 4.14–5.80)
RDW: 16.1 % — AB (ref 12.3–15.4)
WBC: 7.1 10*3/uL (ref 3.4–10.8)

## 2016-04-30 LAB — LIPID PANEL
CHOL/HDL RATIO: 3.4 ratio (ref 0.0–5.0)
Cholesterol, Total: 169 mg/dL (ref 100–199)
HDL: 49 mg/dL (ref >39)
LDL Calculated: 105 mg/dL — ABNORMAL HIGH (ref 0–99)
TRIGLYCERIDES: 75 mg/dL (ref 0–149)
VLDL CHOLESTEROL CAL: 15 mg/dL (ref 5–40)

## 2016-05-05 ENCOUNTER — Encounter: Payer: Self-pay | Admitting: Family Medicine

## 2016-05-05 ENCOUNTER — Ambulatory Visit (INDEPENDENT_AMBULATORY_CARE_PROVIDER_SITE_OTHER): Payer: Medicare Other | Admitting: Family Medicine

## 2016-05-05 VITALS — BP 128/82 | Ht 70.5 in | Wt 198.6 lb

## 2016-05-05 DIAGNOSIS — E784 Other hyperlipidemia: Secondary | ICD-10-CM

## 2016-05-05 DIAGNOSIS — I10 Essential (primary) hypertension: Secondary | ICD-10-CM | POA: Diagnosis not present

## 2016-05-05 DIAGNOSIS — D509 Iron deficiency anemia, unspecified: Secondary | ICD-10-CM

## 2016-05-05 DIAGNOSIS — E7849 Other hyperlipidemia: Secondary | ICD-10-CM

## 2016-05-05 MED ORDER — METOPROLOL SUCCINATE ER 50 MG PO TB24: 50.0000 mg | ORAL_TABLET | Freq: Every day | ORAL | 1 refills | Status: DC

## 2016-05-05 MED ORDER — HYDROCHLOROTHIAZIDE 25 MG PO TABS: 25.0000 mg | ORAL_TABLET | Freq: Every day | ORAL | 1 refills | Status: DC

## 2016-05-05 MED ORDER — AMLODIPINE BESYLATE 10 MG PO TABS: 10.0000 mg | ORAL_TABLET | Freq: Every day | ORAL | 1 refills | Status: DC

## 2016-05-05 NOTE — Progress Notes (Signed)
   Subjective:    Patient ID: Andrew Guerra, male    DOB: March 01, 1940, 76 y.o.   MRN: [id number]  Hypertension  This is a chronic problem. The current episode started more than 1 year ago. Pertinent negatives include no chest pain, headaches or shortness of breath. Risk factors for coronary artery disease include male gender. Treatments tried: norvasc, hctz, metoprolol.    Patient is scheduled to see oncologist later this year for follow-up of lung cancer  Patient follows urology on a yearly basis because of renal cell possible growth it is being followed by them  Review of Systems  Constitutional: Negative for activity change, fatigue and fever.  Respiratory: Negative for cough and shortness of breath.   Cardiovascular: Negative for chest pain and leg swelling.  Neurological: Negative for headaches.       Objective:   Physical Exam  Constitutional: He appears well-nourished. No distress.  Cardiovascular: Normal rate, regular rhythm and normal heart sounds.   No murmur heard. Pulmonary/Chest: Effort normal and breath sounds normal. No respiratory distress.  Musculoskeletal: He exhibits no edema.  Lymphadenopathy:    He has no cervical adenopathy.  Neurological: He is alert.  Psychiatric: His behavior is normal.  Vitals reviewed.         Assessment & Plan:  Hyperlipidemia-previous labs reviewed with patient patient to watch fats in diet stay physically active  Blood pressure under good control with medication continue current measures  Anemia-had a colonoscopy last year. Check occult stool test also check ferritin hemoglobin TIBC await the results may need further workup  Lung cancer follow-up in several months with specialist  Follow-up with thousand 6 months

## 2016-05-13 ENCOUNTER — Other Ambulatory Visit: Payer: Self-pay | Admitting: *Deleted

## 2016-05-13 DIAGNOSIS — D509 Iron deficiency anemia, unspecified: Secondary | ICD-10-CM

## 2016-05-13 LAB — IFOBT (OCCULT BLOOD): IMMUNOLOGICAL FECAL OCCULT BLOOD TEST: NEGATIVE

## 2016-05-15 LAB — IRON AND TIBC
IRON: 49 ug/dL (ref 38–169)
Iron Saturation: 13 % — ABNORMAL LOW (ref 15–55)
TIBC: 367 ug/dL (ref 250–450)
UIBC: 318 ug/dL (ref 111–343)

## 2016-05-15 LAB — FERRITIN: Ferritin: 42 ng/mL (ref 30–400)

## 2016-05-15 LAB — HEMOGLOBIN: Hemoglobin: 11.8 g/dL — ABNORMAL LOW (ref 13.0–17.7)

## 2016-05-26 NOTE — Addendum Note (Signed)
Addended by: Dairl Ponder on: 05/26/2016 11:42 AM   Modules accepted: Orders

## 2016-05-28 ENCOUNTER — Other Ambulatory Visit: Payer: Self-pay | Admitting: *Deleted

## 2016-05-28 DIAGNOSIS — D649 Anemia, unspecified: Secondary | ICD-10-CM

## 2016-06-05 ENCOUNTER — Other Ambulatory Visit: Payer: Self-pay | Admitting: Family Medicine

## 2016-08-02 ENCOUNTER — Telehealth: Payer: Self-pay | Admitting: Family Medicine

## 2016-08-02 NOTE — Telephone Encounter (Signed)
Patient notified Per dr Nicki Reaper -It is okay to get the MRI. It is looking for different reasons. It will not interfere with the fact that he had a CAT scan recently. Please tell the patient to encourage urology to send [name] any findings from the MRI. Patient verbalized understanding

## 2016-08-02 NOTE — Telephone Encounter (Signed)
Patient said that his urologist is wanting him to come in for an MRI for his yearly appointment.  He just recently had his appointment with [facility] last week and had a CT scan completed.  He wants to know if it recommended to have an MRI so close to the CT?

## 2016-08-02 NOTE — Telephone Encounter (Signed)
It is okay to get the MRI. It is looking for different reasons. It will not interfere with the fact that he had a CAT scan recently. Please tell the patient to encourage urology to send [name] any findings from the MRI

## 2016-08-06 ENCOUNTER — Other Ambulatory Visit: Payer: Self-pay | Admitting: Urology

## 2016-08-06 DIAGNOSIS — C641 Malignant neoplasm of right kidney, except renal pelvis: Secondary | ICD-10-CM

## 2016-09-07 LAB — CBC WITH DIFFERENTIAL/PLATELET
Basophils Absolute: 0 10*3/uL (ref 0.0–0.2)
Basos: 0 %
EOS (ABSOLUTE): 0.2 10*3/uL (ref 0.0–0.4)
EOS: 3 %
HEMATOCRIT: 35.8 % — AB (ref 37.5–51.0)
Hemoglobin: 11.9 g/dL — ABNORMAL LOW (ref 13.0–17.7)
IMMATURE GRANULOCYTES: 0 %
Immature Grans (Abs): 0 10*3/uL (ref 0.0–0.1)
LYMPHS ABS: 1.4 10*3/uL (ref 0.7–3.1)
Lymphs: 23 %
MCH: 27 pg (ref 26.6–33.0)
MCHC: 33.2 g/dL (ref 31.5–35.7)
MCV: 81 fL (ref 79–97)
MONOCYTES: 8 %
Monocytes Absolute: 0.5 10*3/uL (ref 0.1–0.9)
Neutrophils Absolute: 3.9 10*3/uL (ref 1.4–7.0)
Neutrophils: 66 %
Platelets: 236 10*3/uL (ref 150–379)
RBC: 4.4 x10E6/uL (ref 4.14–5.80)
RDW: 15.8 % — ABNORMAL HIGH (ref 12.3–15.4)
WBC: 5.9 10*3/uL (ref 3.4–10.8)

## 2016-09-07 LAB — IRON AND TIBC
IRON SATURATION: 15 % (ref 15–55)
IRON: 43 ug/dL (ref 38–169)
TIBC: 282 ug/dL (ref 250–450)
UIBC: 239 ug/dL (ref 111–343)

## 2016-09-07 LAB — FERRITIN: Ferritin: 88 ng/mL (ref 30–400)

## 2016-10-20 ENCOUNTER — Other Ambulatory Visit: Payer: Self-pay | Admitting: Family Medicine

## 2016-11-03 ENCOUNTER — Encounter: Payer: Self-pay | Admitting: Family Medicine

## 2016-11-03 ENCOUNTER — Ambulatory Visit (INDEPENDENT_AMBULATORY_CARE_PROVIDER_SITE_OTHER): Payer: Medicare Other | Admitting: Family Medicine

## 2016-11-03 VITALS — BP 138/72 | Ht 70.5 in | Wt 202.0 lb

## 2016-11-03 DIAGNOSIS — D509 Iron deficiency anemia, unspecified: Secondary | ICD-10-CM

## 2016-11-03 DIAGNOSIS — E7849 Other hyperlipidemia: Secondary | ICD-10-CM

## 2016-11-03 DIAGNOSIS — I10 Essential (primary) hypertension: Secondary | ICD-10-CM | POA: Diagnosis not present

## 2016-11-03 MED ORDER — AMLODIPINE BESYLATE 10 MG PO TABS: 10.0000 mg | ORAL_TABLET | Freq: Every day | ORAL | 2 refills | Status: DC

## 2016-11-03 MED ORDER — HYDROCHLOROTHIAZIDE 25 MG PO TABS: 25.0000 mg | ORAL_TABLET | Freq: Every day | ORAL | 2 refills | Status: DC

## 2016-11-03 MED ORDER — METOPROLOL SUCCINATE ER 50 MG PO TB24: 50.0000 mg | ORAL_TABLET | Freq: Every day | ORAL | 2 refills | Status: DC

## 2016-11-03 NOTE — Progress Notes (Signed)
   Subjective:    Patient ID: Andrew Guerra, male    DOB: 1941/01/06, 76 y.o.   MRN: [id number]  Hypertension  This is a chronic problem. The current episode started more than 1 year ago. The problem has been gradually improving since onset. Pertinent negatives include no chest pain, headaches or shortness of breath.   Patient states he thinks his Bps are under control. He is currently on Amlodipine 10 mg one daily, HCTZ 25 mg one daily,and Metoprolol 50 mg one daily.  States  He eats healthy and gets exercise.Sees several specialists Dr. Jeffie Pollock with [facility], See specialist at [facility] for pulmonary CA.  Pt declines flu shot today. Patient doing a good job taking his medicine Sees urology on a regular basis sees thoracic surgeon for lung cancer on a regular basis along with oncology follow-up. His surgery at [facility] was successful. Review of Systems  Constitutional: Negative for activity change, fatigue and fever.  Respiratory: Negative for cough and shortness of breath.   Cardiovascular: Negative for chest pain and leg swelling.  Neurological: Negative for headaches.       Objective:   Physical Exam  Constitutional: He appears well-nourished. No distress.  Cardiovascular: Normal rate, regular rhythm and normal heart sounds.   No murmur heard. Pulmonary/Chest: Effort normal and breath sounds normal. No respiratory distress.  Musculoskeletal: He exhibits no edema.  Lymphadenopathy:    He has no cervical adenopathy.  Neurological: He is alert.  Psychiatric: His behavior is normal.  Vitals reviewed.         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

## 2016-11-04 ENCOUNTER — Ambulatory Visit: Payer: Medicare Other | Admitting: Family Medicine

## 2016-11-15 ENCOUNTER — Ambulatory Visit (HOSPITAL_COMMUNITY)
Admission: RE | Admit: 2016-11-15 | Discharge: 2016-11-15 | Disposition: A | Discharge status: Home / Self Care | Payer: Medicare Other | Source: Ambulatory Visit | Attending: Urology | Admitting: Urology

## 2016-11-15 DIAGNOSIS — I700 Atherosclerosis of aorta: Secondary | ICD-10-CM | POA: Diagnosis not present

## 2016-11-15 DIAGNOSIS — N281 Cyst of kidney, acquired: Secondary | ICD-10-CM | POA: Diagnosis not present

## 2016-11-15 DIAGNOSIS — K7689 Other specified diseases of liver: Secondary | ICD-10-CM | POA: Diagnosis not present

## 2016-11-15 DIAGNOSIS — C641 Malignant neoplasm of right kidney, except renal pelvis: Secondary | ICD-10-CM | POA: Diagnosis not present

## 2016-11-15 LAB — POCT I-STAT CREATININE: CREATININE: 1.4 mg/dL — AB (ref 0.61–1.24)

## 2016-11-15 MED ORDER — GADOBENATE DIMEGLUMINE 529 MG/ML IV SOLN
20.0000 mL | Freq: Once | INTRAVENOUS | Status: AC | PRN
  Administered 2016-11-15: 19 mL via INTRAVENOUS

## 2016-11-23 ENCOUNTER — Encounter: Payer: Self-pay | Admitting: Family Medicine

## 2016-11-23 LAB — CBC WITH DIFFERENTIAL/PLATELET
BASOS: 0 %
Basophils Absolute: 0 10*3/uL (ref 0.0–0.2)
EOS (ABSOLUTE): 0.1 10*3/uL (ref 0.0–0.4)
Eos: 2 %
Hematocrit: 36.8 % — ABNORMAL LOW (ref 37.5–51.0)
Hemoglobin: 12.1 g/dL — ABNORMAL LOW (ref 13.0–17.7)
Immature Grans (Abs): 0 10*3/uL (ref 0.0–0.1)
Immature Granulocytes: 0 %
LYMPHS ABS: 1.4 10*3/uL (ref 0.7–3.1)
Lymphs: 20 %
MCH: 27.4 pg (ref 26.6–33.0)
MCHC: 32.9 g/dL (ref 31.5–35.7)
MCV: 83 fL (ref 79–97)
MONOS ABS: 0.4 10*3/uL (ref 0.1–0.9)
Monocytes: 6 %
NEUTROS ABS: 5.1 10*3/uL (ref 1.4–7.0)
Neutrophils: 72 %
Platelets: 247 10*3/uL (ref 150–379)
RBC: 4.42 x10E6/uL (ref 4.14–5.80)
RDW: 15.3 % (ref 12.3–15.4)
WBC: 7.1 10*3/uL (ref 3.4–10.8)

## 2016-11-23 LAB — BASIC METABOLIC PANEL
BUN / CREAT RATIO: 14 (ref 10–24)
BUN: 20 mg/dL (ref 8–27)
CO2: 24 mmol/L (ref 20–29)
CREATININE: 1.41 mg/dL — AB (ref 0.76–1.27)
Calcium: 10.1 mg/dL (ref 8.6–10.2)
Chloride: 102 mmol/L (ref 96–106)
GFR calc Af Amer: 55 mL/min/{1.73_m2} — ABNORMAL LOW (ref >59)
GFR, EST NON AFRICAN AMERICAN: 47 mL/min/{1.73_m2} — AB (ref >59)
Glucose: 66 mg/dL (ref 65–99)
Potassium: 3.7 mmol/L (ref 3.5–5.2)
SODIUM: 145 mmol/L — AB (ref 134–144)

## 2016-11-29 ENCOUNTER — Encounter: Payer: Self-pay | Admitting: Family Medicine

## 2016-11-29 DIAGNOSIS — R972 Elevated prostate specific antigen [PSA]: Secondary | ICD-10-CM

## 2016-11-29 HISTORY — DX: Elevated prostate specific antigen (PSA): R97.20

## 2017-01-31 ENCOUNTER — Ambulatory Visit: Payer: Medicare Other | Admitting: Family Medicine

## 2017-02-08 ENCOUNTER — Ambulatory Visit: Payer: Medicare Other | Admitting: Family Medicine

## 2017-02-08 ENCOUNTER — Encounter: Payer: Self-pay | Admitting: Family Medicine

## 2017-02-08 VITALS — BP 128/82 | Ht 70.5 in | Wt 200.8 lb

## 2017-02-08 DIAGNOSIS — R0609 Other forms of dyspnea: Secondary | ICD-10-CM | POA: Diagnosis not present

## 2017-02-08 DIAGNOSIS — Q230 Congenital stenosis of aortic valve: Secondary | ICD-10-CM

## 2017-02-08 DIAGNOSIS — C3492 Malignant neoplasm of unspecified part of left bronchus or lung: Secondary | ICD-10-CM | POA: Insufficient documentation

## 2017-02-08 DIAGNOSIS — I350 Nonrheumatic aortic (valve) stenosis: Secondary | ICD-10-CM

## 2017-02-08 DIAGNOSIS — Q231 Congenital insufficiency of aortic valve: Secondary | ICD-10-CM

## 2017-02-08 HISTORY — DX: Nonrheumatic aortic (valve) stenosis: I35.0

## 2017-02-08 HISTORY — DX: Malignant neoplasm of unspecified part of left bronchus or lung: C34.92

## 2017-02-08 HISTORY — DX: Congenital stenosis of aortic valve: Q23.0

## 2017-02-08 NOTE — Progress Notes (Signed)
   Subjective:    Patient ID: Andrew Guerra, male    DOB: 1940-03-19, 77 y.o.   MRN: [id number]  HPI  Patient arrives to discuss his treatments concerning lung cancer. The patient had surgical resection a while back.  At that time he did not want to go through chemotherapy.  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  His specialist at [facility] is recommending a PET scan and radiation therapy.  It is unlikely that other measures would be beneficial.  PET scan is set up for next week-the patient is willing to get this done  The patient does not want to go through any type of treatments that would cause his quality of life to suffer greatly.  She is interested in getting better he is interested in seeking treatment.  He states that traveled from [place] to [employer] is a impediment he is interested in possibly getting radiation therapy in [place].  He is willing to see his specialist at [facility] and have the PET scan next week as planned Review of Systems    He relates a little bit of shortness of breath with activity he denies chest pressure pain discomfort denies hemoptysis denies fever chills sweats denies loss of appetite denies being depressed or suicidal Objective:   Physical Exam Neck no masses lungs clear with pulse normal BP.  Extremities no edema   25 minutes was spent with the patient. Greater than half the time was spent in discussion and answering questions and counseling regarding the issues that the patient came in for today.     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

## 2017-02-15 ENCOUNTER — Other Ambulatory Visit: Payer: Self-pay | Admitting: *Deleted

## 2017-02-15 DIAGNOSIS — N200 Calculus of kidney: Secondary | ICD-10-CM

## 2017-02-15 NOTE — Progress Notes (Signed)
I called and left a message with the receptionist to have Dr.DAmico to call him on his cell phone.# given.02/15/2017

## 2017-02-16 ENCOUNTER — Telehealth: Payer: Self-pay | Admitting: Family Medicine

## 2017-02-16 NOTE — Telephone Encounter (Signed)
I had a good conversation with Andrew Guerra.  He is aware that I did talk with radiation oncology at [facility].  He is also aware that I spoke with the PA of Dr. Elenor Quinones at [facility].  He will go tomorrow for his PET scan.  He will discussed the case with Dr. Elenor Quinones.  Possibly will end up needing to have a CT scan biopsy.  Consultation with radiation oncology at [facility] who can refer him to the radiation oncologist at [facility].  If PET scan looks good then more than likely they will do follow-up CAT scan in several months.  The patient is aware that if I can be of any help he can call

## 2017-02-17 DIAGNOSIS — R911 Solitary pulmonary nodule: Secondary | ICD-10-CM | POA: Insufficient documentation

## 2017-02-18 ENCOUNTER — Telehealth: Payer: Self-pay | Admitting: Family Medicine

## 2017-02-18 NOTE — Telephone Encounter (Signed)
I did discuss the case with the patient his PET scan was positive they will be doing a needle guided biopsy next week having a consultation with radiation oncology then he will decide if he is doing his radiation at [facility] or at [facility] he will let us know if there is anything we can do for him

## 2017-02-18 NOTE — Telephone Encounter (Signed)
I spoke with the patient he is aware you will be calling after 5pm today.

## 2017-02-18 NOTE — Telephone Encounter (Signed)
Pt called requesting a call back from Dr. Nicki Reaper. Pt stated that he was told to call and that Dr. Nicki Reaper would call him back regarding his situation. Pt states that he will be home this evening.

## 2017-03-14 ENCOUNTER — Telehealth: Payer: Self-pay | Admitting: Family Medicine

## 2017-03-14 NOTE — Telephone Encounter (Signed)
Patient said Dr. Nicki Reaper wanted him to update him with his therapy schedule at [facility]  He started last Friday with the first radiation treatment.  That lasted about 20 mins.  He had another treatment today.  He has 1 on Thursday, next Monday and next Thursday and this will be a total of 5 treatments.  After that he said that he wont have anymore for 4 months at which time he will have him back to do a biopsy.  Also, at the end of his sessions, he is supposed to send Dr. Nicki Reaper notes.

## 2017-03-14 NOTE — Telephone Encounter (Signed)
Please see below.

## 2017-03-21 NOTE — Telephone Encounter (Signed)
I called and left a message I will try back at another time

## 2017-03-21 NOTE — Telephone Encounter (Signed)
I discussed the case with the patient he is finishing up his radiation treatments they will see him again in September and do additional scan at that time/PET scan/[facility]/patient has a follow-up office visit with [name] in April he will keep

## 2017-05-03 ENCOUNTER — Telehealth: Payer: Self-pay | Admitting: Family Medicine

## 2017-05-03 ENCOUNTER — Encounter: Payer: Self-pay | Admitting: Family Medicine

## 2017-05-03 ENCOUNTER — Ambulatory Visit: Payer: Medicare Other | Admitting: Family Medicine

## 2017-05-03 VITALS — BP 144/86 | Ht 70.5 in | Wt 197.0 lb

## 2017-05-03 DIAGNOSIS — Q231 Congenital insufficiency of aortic valve: Secondary | ICD-10-CM

## 2017-05-03 DIAGNOSIS — Q230 Congenital stenosis of aortic valve: Secondary | ICD-10-CM

## 2017-05-03 DIAGNOSIS — E7849 Other hyperlipidemia: Secondary | ICD-10-CM | POA: Diagnosis not present

## 2017-05-03 DIAGNOSIS — I10 Essential (primary) hypertension: Secondary | ICD-10-CM | POA: Diagnosis not present

## 2017-05-03 MED ORDER — METOPROLOL SUCCINATE ER 50 MG PO TB24: 50.0000 mg | ORAL_TABLET | Freq: Every day | ORAL | 2 refills | Status: DC

## 2017-05-03 MED ORDER — AMLODIPINE BESYLATE 10 MG PO TABS: 10.0000 mg | ORAL_TABLET | Freq: Every day | ORAL | 2 refills | Status: DC

## 2017-05-03 MED ORDER — HYDROCHLOROTHIAZIDE 25 MG PO TABS: 25.0000 mg | ORAL_TABLET | Freq: Every day | ORAL | 2 refills | Status: DC

## 2017-05-03 NOTE — Telephone Encounter (Signed)
[facility] for pt, just need to give him appointment information for his Echo  Monday, May 16, 2017 - arrive at 9:15am at the main enterance at [facility] for his echocardiogram

## 2017-05-03 NOTE — Progress Notes (Signed)
   Subjective:    Patient ID: Andrew Guerra, male    DOB: 1940/08/03, 77 y.o.   MRN: [id number]  HPI Pt here today for 6 month follow up on HTN, hyperlipidemia, and iron deficiency.    Patient for blood pressure check up. Patient relates compliance with meds. Todays BP reviewed with the patient. Patient denies issues with medication. Patient relates reasonable diet. Patient tries to minimize salt. Patient aware of BP goals.  Patient also has mild hyperlipidemia watch his diet closely Has history of a renal growth they are doing a follow-up MRI later this year  Has history of aortic stenosis we do need to do a follow-up ultrasound echo  Urology follows him annually for PSA renal growth Review of Systems  Constitutional: Negative for activity change, fatigue and fever.  HENT: Negative for congestion and rhinorrhea.   Respiratory: Negative for cough and shortness of breath.   Cardiovascular: Negative for chest pain and leg swelling.  Gastrointestinal: Negative for abdominal pain, diarrhea and nausea.  Genitourinary: Negative for dysuria and hematuria.  Neurological: Negative for weakness and headaches.  Psychiatric/Behavioral: Negative for behavioral problems.       Objective:   Physical Exam  Constitutional: He appears well-nourished. No distress.  HENT:  Head: Normocephalic and atraumatic.  Eyes: Right eye exhibits no discharge. Left eye exhibits no discharge.  Neck: No tracheal deviation present.  Cardiovascular: Normal rate and regular rhythm.  Murmur heard. Pulmonary/Chest: Effort normal and breath sounds normal. No respiratory distress. He has no wheezes.  Musculoskeletal: He exhibits no edema.  Lymphadenopathy:    He has no cervical adenopathy.  Neurological: He is alert.  Skin: Skin is warm. No rash noted.  Psychiatric: His behavior is normal.  Vitals reviewed.         Assessment & Plan:  Blood pressure good control continue current measures Follow-up with  urology on a yearly basis for renal growth and elevated PSA Echo ordered await the results regarding aortic stenosis Hyperlipidemia check lipid profile

## 2017-05-04 ENCOUNTER — Ambulatory Visit: Payer: Medicare Other | Admitting: Family Medicine

## 2017-05-05 NOTE — Telephone Encounter (Signed)
Pt aware.

## 2017-05-12 LAB — LIPID PANEL
CHOLESTEROL TOTAL: 178 mg/dL (ref 100–199)
Chol/HDL Ratio: 3.5 ratio (ref 0.0–5.0)
HDL: 51 mg/dL (ref >39)
LDL Calculated: 114 mg/dL — ABNORMAL HIGH (ref 0–99)
TRIGLYCERIDES: 64 mg/dL (ref 0–149)
VLDL Cholesterol Cal: 13 mg/dL (ref 5–40)

## 2017-05-16 ENCOUNTER — Ambulatory Visit (HOSPITAL_COMMUNITY)
Admission: RE | Admit: 2017-05-16 | Discharge: 2017-05-16 | Disposition: A | Discharge status: Home / Self Care | Payer: Medicare Other | Source: Ambulatory Visit | Attending: Family Medicine | Admitting: Family Medicine

## 2017-05-16 DIAGNOSIS — E785 Hyperlipidemia, unspecified: Secondary | ICD-10-CM | POA: Diagnosis not present

## 2017-05-16 DIAGNOSIS — Q231 Congenital insufficiency of aortic valve: Secondary | ICD-10-CM | POA: Insufficient documentation

## 2017-05-16 DIAGNOSIS — I10 Essential (primary) hypertension: Secondary | ICD-10-CM | POA: Insufficient documentation

## 2017-05-16 DIAGNOSIS — Q230 Congenital stenosis of aortic valve: Secondary | ICD-10-CM | POA: Diagnosis not present

## 2017-05-16 NOTE — Progress Notes (Signed)
*  PRELIMINARY RESULTS* Echocardiogram 2D Echocardiogram has been performed.  Leavy Cella 05/16/2017, 10:37 AM

## 2017-05-20 ENCOUNTER — Other Ambulatory Visit: Payer: Self-pay | Admitting: Family Medicine

## 2017-05-20 DIAGNOSIS — Q230 Congenital stenosis of aortic valve: Secondary | ICD-10-CM

## 2017-05-20 DIAGNOSIS — Z79899 Other long term (current) drug therapy: Secondary | ICD-10-CM

## 2017-05-20 DIAGNOSIS — E785 Hyperlipidemia, unspecified: Secondary | ICD-10-CM

## 2017-05-20 DIAGNOSIS — Q231 Congenital insufficiency of aortic valve: Principal | ICD-10-CM

## 2017-05-20 MED ORDER — ATORVASTATIN CALCIUM 10 MG PO TABS: 10.0000 mg | ORAL_TABLET | Freq: Every day | ORAL | 5 refills | Status: DC

## 2017-05-27 ENCOUNTER — Encounter: Payer: Self-pay | Admitting: Family Medicine

## 2017-06-23 ENCOUNTER — Telehealth: Payer: Self-pay

## 2017-06-23 MED ORDER — METOPROLOL SUCCINATE ER 50 MG PO TB24: ORAL_TABLET | ORAL | 0 refills | Status: DC

## 2017-06-23 NOTE — Telephone Encounter (Signed)
Patient wife is calling today stating this am around 7 am the pt was disoriented and dizzy. His bp at that time was 81/54 with a pulse 47. The pt wife states his bp has cam up to 105/65 and pulse 59. He is better now and can have a coherent conversation with her and no longer experiencing  the dizziness. He is on three different bp meds per his wife. Amlodipine,toprolol. And Hctz. I advised I would send a note back and see what was recommended. If worsens go to the Ed.

## 2017-06-23 NOTE — Telephone Encounter (Signed)
I discussed the case with the patient.  He is feeling better now.  Blood pressure now 128/76.  I instructed the patient to reduce metoprolol XL new dose is one half of the tablet daily.  I also instructed him should his blood pressure run low over the next several days reduce amlodipine from 10 mg down to 5 mg.  I also instructed the patient to call us on Monday to give [name] an update.  Call us sooner if any problems.  Nurses-please change medication directions within the epic system to reflect metoprolol XL new dose 1/2 tablet daily thank you

## 2017-06-23 NOTE — Telephone Encounter (Signed)
Changed in epic.

## 2017-06-27 ENCOUNTER — Telehealth: Payer: Self-pay | Admitting: Family Medicine

## 2017-06-27 ENCOUNTER — Encounter: Payer: Self-pay | Admitting: *Deleted

## 2017-06-27 NOTE — Telephone Encounter (Signed)
Pt.notified

## 2017-06-27 NOTE — Telephone Encounter (Signed)
Looks good cst, enough refills to last til f u visit with dr Nicki Reaper

## 2017-06-27 NOTE — Telephone Encounter (Signed)
Pt wanted to report his BP since the decrease in his metoprolol succinate (TOPROL-XL) 50 MG 24 hr tablet  Pt states that dose went from 50 to 25  06/25/17 - 9am - 92/60, Pulse 69              11am - 121/79, pulse 71   11pm - 124/76, pulse 73  06/26/17 - 7am - 113/76, Pulse 75   5:15pm - 132/82, Pulse 72  06/27/17 - 7am - 95/60, Pulse 70     12:45pm - 123/86, Pulse 67  Pt feels ok & decrease dose seems to be working   Please advise

## 2017-06-27 NOTE — Telephone Encounter (Signed)
And he has enough refills

## 2017-07-12 ENCOUNTER — Encounter: Payer: Self-pay | Admitting: Cardiology

## 2017-07-12 ENCOUNTER — Ambulatory Visit: Payer: Medicare Other | Admitting: Cardiology

## 2017-07-12 VITALS — BP 138/83 | HR 72 | Ht 70.5 in | Wt 199.4 lb

## 2017-07-12 DIAGNOSIS — I4510 Unspecified right bundle-branch block: Secondary | ICD-10-CM | POA: Diagnosis not present

## 2017-07-12 DIAGNOSIS — Q230 Congenital stenosis of aortic valve: Secondary | ICD-10-CM | POA: Diagnosis not present

## 2017-07-12 DIAGNOSIS — I10 Essential (primary) hypertension: Secondary | ICD-10-CM

## 2017-07-12 DIAGNOSIS — Q231 Congenital insufficiency of aortic valve: Secondary | ICD-10-CM | POA: Diagnosis not present

## 2017-07-12 HISTORY — DX: Unspecified right bundle-branch block: I45.10

## 2017-07-12 NOTE — Patient Instructions (Signed)
Medication Instructions:  Your physician recommends that you continue on your current medications as directed. Please refer to the Current Medication list given to you today.  Labwork: None ordered   Testing/Procedures: Your physician has requested that you have an echocardiogram in October 2019. Echocardiography is a painless test that uses sound waves to create images of your heart. It provides your doctor with information about the size and shape of your heart and how well your heart's chambers and valves are working. This procedure takes approximately one hour. There are no restrictions for this procedure.   Follow-Up: Your physician wants you to follow-up in: 6 months with Dr.Turner. You will receive a reminder letter in the mail two months in advance. If you don't receive a letter, please call our office to schedule the follow-up appointment.  Any Other Special Instructions Will Be Listed Below (If Applicable).    Thank you for choosing [facility], RN  [phone]  If you need a refill on your cardiac medications before your next appointment, please call your pharmacy.

## 2017-07-12 NOTE — Progress Notes (Signed)
Cardiology Office Note    Date:  07/12/2017   ID:  Andrew Guerra, DOB [birth year], MRN [id number]  PCP:  [name], MD  Cardiologist:  Fransico Him, MD   Chief Complaint  Patient presents with  . Aortic Stenosis  . Hypertension    History of Present Illness:  Andrew Guerra is a 77 y.o. male who is being seen today for the evaluation of bicuspid AV with AS at the request of Luking, Elayne Snare, MD.  Is a pleasant 77 year old male with a known history of bicuspid aortic valve.  He had a 2D echocardiogram done on 05/16/2017 showing moderate LVH with normal LV systolic function EF 60 to 65% and grade 1 diastolic dysfunction.  He has a history of reported bicuspid aortic valve although last echo question trileaflet valve.  There was moderate to severe calcific aortic stenosis with a mean aortic valve gradient of 18 mmHg.  RV systolic function was mild to moderately reduced.  He is now here for cardiac evaluation.  He also has a history of hypertension.  He has had a heart murmur since childhood but is never had any problems and also has a history of rheumatic fever.  he is here today for followup and is doing well.  He denies any chest pain or pressure, SOB, DOE, PND, orthopnea, LE edema, dizziness, palpitations or syncope.  He is very active and works out at [employer] riding on the bicycle 3 times weekly for 4 miles.  He has no symptoms of shortness of breath or chest pressure or reduced exercise tolerance when he is working at [employer].  The only time he has shortness of breath is walking up an incline where he lives and this is been something is been going on for a long time and nothing new.  He is compliant with his meds and is tolerating meds with no SE.    Past Medical History:  Diagnosis Date  . 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   . Cataract    right eye - surgery to remove  . Elevated PSA 11/29/2016   Patient is  followed by [facility].  Most recent PSA near 11.  He has had atypia on biopsy.  November 2008  . Essential hypertension, benign 06/08/2012  . Full dentures   . Glaucoma   . Heart murmur    since childhood, never has caused any problems  . History of rheumatic fever 11/04/2015  . Hyperlipidemia 04/25/2013  . Hypertension   . Malignant neoplasm of left lung ([place]) 02/08/2017  . RBBB 07/12/2017  . Sickle cell trait ([place])    no problems per patient    Past Surgical History:  Procedure Laterality Date  . cataract eye surgery Right   . COLONOSCOPY  11/2009   hx polyps/[place]  . PROSTATE BIOPSY    . right eye surgery     to lower eye pressure  . TONSILLECTOMY    . VIDEO ASSISTED THORACOSCOPY (VATS)/ LOBECTOMY Right 12/26/2015  . wisdom tteeth ext      Current Medications: Current Meds  Medication Sig  . amLODipine (NORVASC) 10 MG tablet Take 1 tablet (10 mg total) by mouth daily.  [name] aspirin 81 MG chewable tablet Chew by mouth daily.  [name] atorvastatin (LIPITOR) 10 MG tablet Take 1 tablet (10 mg total) by mouth daily.  . Brinzolamide-Brimonidine (SIMBRINZA) 1-0.2 % SUSP Apply to eye.  [name]  Flaxseed, Linseed, (FLAX SEEDS) POWD Take by mouth.  . hydrochlorothiazide (HYDRODIURIL) 25 MG tablet Take 1 tablet (25 mg total) by mouth daily.  . metoprolol succinate (TOPROL-XL) 50 MG 24 hr tablet Take 1/2 tablet daily.Take with or immediately following a meal.  . Multiple Vitamin (MULTIVITAMIN) tablet Take 1 tablet by mouth daily.  . timolol (BETIMOL) 0.5 % ophthalmic solution 1 drop 2 (two) times daily.  [name] XIIDRA 5 % SOLN PLACE 1 DROP INTO EACH EYE TWICE A DAY    Allergies:   Patient has no known allergies.   Social History   Socioeconomic History  . Marital status: Married    Spouse name: Not on file  . Number of children: Not on file  . Years of education: Not on file  . Highest education level: Not on file  Occupational History  . Not on file  Social Needs  . Financial resource  strain: Not on file  . Food insecurity:    Worry: Not on file    Inability: Not on file  . Transportation needs:    Medical: Not on file    Non-medical: Not on file  Tobacco Use  . Smoking status: Former Smoker    Packs/day: 1.00    Types: Cigarettes    Last attempt to quit: 01/19/2011    Years since quitting: 6.4  . Smokeless tobacco: Never Used  Substance and Sexual Activity  . Alcohol use: Yes    Alcohol/week: 0.6 oz    Types: 1 Shots of liquor per week  . Drug use: No  . Sexual activity: Not on file  Lifestyle  . Physical activity:    Days per week: Not on file    Minutes per session: Not on file  . Stress: Not on file  Relationships  . Social connections:    Talks on phone: Not on file    Gets together: Not on file    Attends religious service: Not on file    Active member of club or organization: Not on file    Attends meetings of clubs or organizations: Not on file    Relationship status: Not on file  Other Topics Concern  . Not on file  Social History Narrative  . Not on file     Family History:  The patient's family history includes Cerebral aneurysm in his mother; Diabetes in his mother and sister; Lung cancer in his father.   ROS:   Please see the history of present illness.    ROS All other systems reviewed and are negative.  No flowsheet data found.   PHYSICAL EXAM:   VS:  BP 138/83   Pulse 72   Ht 5' 10.5" (1.791 m)   Wt 199 lb 6.4 oz (90.4 kg)   BMI 28.21 kg/m    GEN: Well nourished, well developed, in no acute distress  HEENT: normal  Neck: no JVD, carotid bruits, or masses Cardiac: RRR; no  rubs, or gallops,no edema.  Intact distal pulses bilaterally.  2/6 mid late peaking systolic murmur with intact A2 component of the second heart sound. Respiratory:  clear to auscultation bilaterally, normal work of breathing GI: soft, nontender, nondistended, + BS MS: no deformity or atrophy  Skin: warm and dry, no rash Neuro:  Alert and Oriented x 3,  Strength and sensation are intact Psych: euthymic mood, full affect  Wt Readings from Last 3 Encounters:  07/12/17 199 lb 6.4 oz (90.4 kg)  05/03/17 197 lb (89.4 kg)  02/08/17 200  lb 12.8 oz (91.1 kg)      Studies/Labs Reviewed:  2D echo 04/2017  EKG:  EKG is ordered today.  The ekg ordered today demonstrates normal sinus rhythm at 72 bpm with right bundle branch block and left anterior fascicular block.  Recent Labs: 11/22/2016: BUN 20; Creatinine, Ser 1.41; Hemoglobin 12.1; Platelets 247; Potassium 3.7; Sodium 145   Lipid Panel    Component Value Date/Time   CHOL 178 05/11/2017 0809   TRIG 64 05/11/2017 0809   HDL 51 05/11/2017 0809   CHOLHDL 3.5 05/11/2017 0809   CHOLHDL 3.7 04/10/2013 0808   VLDL 16 04/10/2013 0808   LDLCALC 114 (H) 05/11/2017 0809    Additional studies/ records that were reviewed today include:  Office notes    ASSESSMENT:    1. 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 Essential hypertension, benign   3. RBBB      PLAN:  In order of problems listed above:  1.  Bicuspid AV with moderate AS -2D echo 05/16/2017 showed moderate LVH with normal LV function and moderate to severe calcific aortic stenosis.  The valve is severely calcified but mean gradient was only 18 mmHg.  By exam he has probably moderate aortic stenosis and is completely asymptomatic.  I will repeat echo in 6 months to make sure there is no progression.  He is completely asymptomatic.  I did discuss the warning symptoms at the valve may be getting tighter including new shortness of breath with exertion, chest discomfort, decreased exercise tolerance, dizziness or syncope.  He was instructed to call me immediately if he has any of these symptoms.  2.  HTN - BP is well controlled on exam today.  He will continue on amlodipine 10 mg daily, HCTZ 25 mg daily and Toprol XL 50 mg daily.  3.  Right bundle branch block -unclear if this is old or new as I do not have an EKG to compare.   I will contact his PCP to get an old EKG to compare.  He is completely asymptomatic from a cardiac standpoint.   Medication Adjustments/Labs and Tests Ordered: Current medicines are reviewed at length with the patient today.  Concerns regarding medicines are outlined above.  Medication changes, Labs and Tests ordered today are listed in the Patient Instructions below.  Patient Instructions  Medication Instructions:  Your physician recommends that you continue on your current medications as directed. Please refer to the Current Medication list given to you today.  Labwork: None ordered   Testing/Procedures: Your physician has requested that you have an echocardiogram in October 2019. Echocardiography is a painless test that uses sound waves to create images of your heart. It provides your doctor with information about the size and shape of your heart and how well your heart's chambers and valves are working. This procedure takes approximately one hour. There are no restrictions for this procedure.   Follow-Up: Your physician wants you to follow-up in: 6 months with Dr.Turner. You will receive a reminder letter in the mail two months in advance. If you don't receive a letter, please call our office to schedule the follow-up appointment.  Any Other Special Instructions Will Be Listed Below (If Applicable).    Thank you for choosing [facility], RN  [phone]  If you need a refill on your cardiac medications before your next appointment, please call your pharmacy.      Signed, Fransico Him, MD  07/12/2017 10:21 AM  [facility] [address], [place], [place]  [address] Phone: [phone]; Fax: [phone]

## 2017-07-26 ENCOUNTER — Telehealth: Payer: Self-pay | Admitting: Family Medicine

## 2017-07-26 NOTE — Telephone Encounter (Signed)
Pt calling to make Dr. Nicki Reaper aware that he has stopped taking atorvastatin (LIPITOR) 10 MG tablet. He stopped taking it on Friday. He had took it for 2 to 3 weeks and stated it made him feel achy and tired. Since stopping the medication he has not been feeling achy or tired.

## 2017-07-26 NOTE — Telephone Encounter (Signed)
Ok may send this info to dr Nicki Reaper

## 2017-07-31 ENCOUNTER — Other Ambulatory Visit: Payer: Self-pay | Admitting: Family Medicine

## 2017-07-31 NOTE — Telephone Encounter (Signed)
Please let the patient know that I am aware that he stop the medicine.  Because of the side effects I recommend to stay off the medication.  There is a different medicine that is generic that is not a statin that can help reduce cholesterol to a mild degree by blocking cholesterol absorption within the intestine.  It does not cause achiness or fatigue.  If he is willing to try this medication I recommend Zetia 10 mg 1 daily, #30, 5 refills, keep all regular follow-up visits   nurses-please know if the patient states he does not want to start this medicine and would rather discuss this when he follows up Feel free to go ahead and document accordingly and have the patient discuss this further with me when he does his regular follow-up

## 2017-08-01 NOTE — Telephone Encounter (Signed)
Contacted patient. Pt verbalized understanding.

## 2017-08-01 NOTE — Telephone Encounter (Signed)
That sounds fine, please have patient do his lab work before his follow-up visit in September he can do the lab work approximately 3 to 10 days before the visit t-fasting

## 2017-08-01 NOTE — Telephone Encounter (Signed)
Pt returned call. Pt verbalized understanding about staying off the meds. Pt states he would like to wait until he has his lab work done to see where his cholesterol is. States he is trying to work on his diet. Pt is scheduling a follow up visit to discuss this with provider.

## 2017-08-01 NOTE — Telephone Encounter (Signed)
Left message to return call 

## 2017-09-06 ENCOUNTER — Encounter: Payer: Self-pay | Admitting: Family Medicine

## 2017-09-06 LAB — HEPATIC FUNCTION PANEL
ALT: 15 IU/L (ref 0–44)
AST: 21 IU/L (ref 0–40)
Albumin: 3.2 g/dL — ABNORMAL LOW (ref 3.5–4.8)
Alkaline Phosphatase: 91 IU/L (ref 39–117)
Bilirubin Total: 0.2 mg/dL (ref 0.0–1.2)
Bilirubin, Direct: 0.05 mg/dL (ref 0.00–0.40)
TOTAL PROTEIN: 5.3 g/dL — AB (ref 6.0–8.5)

## 2017-09-06 LAB — LIPID PANEL
CHOLESTEROL TOTAL: 177 mg/dL (ref 100–199)
Chol/HDL Ratio: 3.6 ratio (ref 0.0–5.0)
HDL: 49 mg/dL (ref >39)
LDL CALC: 114 mg/dL — AB (ref 0–99)
TRIGLYCERIDES: 69 mg/dL (ref 0–149)
VLDL CHOLESTEROL CAL: 14 mg/dL (ref 5–40)

## 2017-09-29 ENCOUNTER — Telehealth: Payer: Self-pay | Admitting: Family Medicine

## 2017-09-29 NOTE — Telephone Encounter (Signed)
Patient called states he was told to decrease his metoprolol 50 mg to half a tablet per day when he was here last.He states he was having some issues with the bp bottoming out at the he stats he had been on atorvastatin and has since stopped it. He states he had forgotten to increase the Metoprolol back to 50 mg (a whole tablet ) he started back on the 50 mg on Sept 3,2019 and is concerned that his bp at night is still in a range he does not want it. He states he is taking the Metoprolol 50 mg once daily,Amlodipine 10 mg once daily,and Hctz 25 mg one per day.He states his bp's have been running as followed. *am this am Bp was 147/80 with pulse of 73,at 9:10 am 120/76 p 69,9:50 127/77 p 64,12:30p 130/70 p 59,1:15 pm 114/70 p 68. I asked if he has had any headaches,dizziness any other problems he states no,but his feet do swell and he soaks them. He states he has an appointment with [name] on 10/10/2017 at 8:am with Dr.Scott Luking.He wanted to know if he needed to make any adjustments. I advised that for the most part the bp's are in range and would forward message to you.

## 2017-09-29 NOTE — Telephone Encounter (Signed)
Patient is calling regards of BP fluctuating up and down, pt was taking whole BP pill, then transitioned to half a pill and now pt is currently back on whole pill. an feet and ankles have gotten swollen for 2-3 weeks.  Systolic BP reading has averaged in 150's.

## 2017-09-30 NOTE — Telephone Encounter (Signed)
The swelling in his feet are more than likely related to the amlodipine.  The fluctuation in the blood pressure is not worrisome but is it is something I would like to address further at the office visit  I would recommend the patient check blood pressure twice per day morning and late afternoon, he should always be sitting for at least 5 minutes before checking the blood pressure.  The patient should record these blood pressure readings on paper and bring them with him when he comes  If he is interested we could move his appointment to this coming Thursday  Please have patient bring his blood pressure cuff and machine with him

## 2017-09-30 NOTE — Telephone Encounter (Signed)
Patient is aware and was transferred up front to reschedule the appointment.

## 2017-10-06 ENCOUNTER — Ambulatory Visit: Payer: Medicare Other | Admitting: Family Medicine

## 2017-10-06 ENCOUNTER — Encounter: Payer: Self-pay | Admitting: Family Medicine

## 2017-10-06 VITALS — BP 158/88 | Ht 70.5 in | Wt 196.0 lb

## 2017-10-06 DIAGNOSIS — R600 Localized edema: Secondary | ICD-10-CM | POA: Diagnosis not present

## 2017-10-06 DIAGNOSIS — R0600 Dyspnea, unspecified: Secondary | ICD-10-CM | POA: Diagnosis not present

## 2017-10-06 DIAGNOSIS — Q230 Congenital stenosis of aortic valve: Secondary | ICD-10-CM

## 2017-10-06 DIAGNOSIS — I10 Essential (primary) hypertension: Secondary | ICD-10-CM

## 2017-10-06 DIAGNOSIS — Q231 Congenital insufficiency of aortic valve: Secondary | ICD-10-CM

## 2017-10-06 MED ORDER — AMLODIPINE BESYLATE 5 MG PO TABS: 5.0000 mg | ORAL_TABLET | Freq: Every day | ORAL | 5 refills | Status: DC

## 2017-10-06 MED ORDER — HYDRALAZINE HCL 25 MG PO TABS: 25.0000 mg | ORAL_TABLET | Freq: Three times a day (TID) | ORAL | 3 refills | Status: DC

## 2017-10-06 NOTE — Progress Notes (Signed)
   Subjective:    Patient ID: Andrew Guerra, male    DOB: November 07, 1940, 77 y.o.   MRN: [id number]  HPI Patient is here today to follow up on htn.He states he had some issues with bp and was asked to come in earlier to discuss. Please see previous phone messages Also see notes from the specialist at [facility] Had a CT scan recently which looked good Patient does relate getting short of breath with activity he did have a low oxygen level at [facility] He denies any chest tightness pressure or pain Energy level overall doing fairly well Blood pressures have been erratic and at times elevated He is concerned about this He is going to try to do the best can get this under control He did bring his blood pressure medicines as well as his blood pressure cuff in today  Review of Systems  Constitutional: Negative for activity change, fatigue and fever.  HENT: Negative for congestion and rhinorrhea.   Respiratory: Negative for cough and shortness of breath.   Cardiovascular: Negative for chest pain and leg swelling.  Gastrointestinal: Negative for abdominal pain, diarrhea and nausea.  Genitourinary: Negative for dysuria and hematuria.  Neurological: Negative for weakness and headaches.  Psychiatric/Behavioral: Negative for agitation and behavioral problems.       Objective:   Physical Exam  Constitutional: He appears well-nourished. No distress.  HENT:  Head: Normocephalic and atraumatic.  Eyes: Right eye exhibits no discharge. Left eye exhibits no discharge.  Neck: No tracheal deviation present.  Cardiovascular: Normal rate, regular rhythm and normal heart sounds.  No murmur heard. Pulmonary/Chest: Effort normal and breath sounds normal. No respiratory distress.  Musculoskeletal: He exhibits no edema.  Lymphadenopathy:    He has no cervical adenopathy.  Neurological: He is alert. Coordination normal.  Skin: Skin is warm and dry.  Psychiatric: He has a normal mood and affect.  His behavior is normal.  Vitals reviewed. There is some significant pedal edema going on.  This is more likely due to his amlodipine and not due to CHF but if the swelling does not go down with reducing the amlodipine to 5 mg the patient will let us know.  His lungs sounded clear there was no crackles.  He denies any PND denies orthopnea.  Patient had good ejection fraction on echo back in April.  He does have moderate aortic stenosis which can contribute to his shortness of breath Blood pressure was checked with our cuff and his cuff and shows very close numbers     Assessment & Plan:  Significant shortness of breath with activity along with hypoxia this is concerning recent CAT scan did not show recurrence of his lung cancer  It is quite possible patient developing COPD we will check a pre-and post pulmonary function test  Blood pressure fluctuation I recommend the patient check his blood pressure no more than twice per day he will send [name] updates on how he is doing He is having significant swelling in his lower legs related to amlodipine  Reduce amlodipine new dose 5 mg daily Add hydralazine 25 mg 3 times daily Will adjust this moving forward based upon his results  Follow-up in approximately 4 months sooner if any problems His wife will help him by sending messages to [name] regarding his blood pressures She was present today More than likely geared toward following up the patient in several months but pulmonary function test will alter this to some degree await the results

## 2017-10-07 NOTE — Addendum Note (Signed)
Addended by: Dairl Ponder on: 10/07/2017 10:30 AM   Modules accepted: Orders

## 2017-10-10 ENCOUNTER — Other Ambulatory Visit: Payer: Self-pay | Admitting: Urology

## 2017-10-10 ENCOUNTER — Ambulatory Visit: Payer: Medicare Other | Admitting: Family Medicine

## 2017-10-10 DIAGNOSIS — D4100 Neoplasm of uncertain behavior of unspecified kidney: Secondary | ICD-10-CM

## 2017-10-10 DIAGNOSIS — D4120 Neoplasm of uncertain behavior of unspecified ureter: Principal | ICD-10-CM

## 2017-10-24 ENCOUNTER — Ambulatory Visit (HOSPITAL_BASED_OUTPATIENT_CLINIC_OR_DEPARTMENT_OTHER): Payer: Medicare Other

## 2017-10-24 ENCOUNTER — Other Ambulatory Visit: Payer: Self-pay

## 2017-10-24 ENCOUNTER — Encounter: Payer: Self-pay | Admitting: Cardiology

## 2017-10-24 ENCOUNTER — Ambulatory Visit (HOSPITAL_COMMUNITY)
Admission: RE | Admit: 2017-10-24 | Discharge: 2017-10-24 | Disposition: A | Discharge status: Home / Self Care | Payer: Medicare Other | Source: Ambulatory Visit | Attending: Family Medicine | Admitting: Family Medicine

## 2017-10-24 DIAGNOSIS — I119 Hypertensive heart disease without heart failure: Secondary | ICD-10-CM | POA: Insufficient documentation

## 2017-10-24 DIAGNOSIS — D573 Sickle-cell trait: Secondary | ICD-10-CM | POA: Insufficient documentation

## 2017-10-24 DIAGNOSIS — R011 Cardiac murmur, unspecified: Secondary | ICD-10-CM | POA: Insufficient documentation

## 2017-10-24 DIAGNOSIS — E785 Hyperlipidemia, unspecified: Secondary | ICD-10-CM

## 2017-10-24 DIAGNOSIS — Q230 Congenital stenosis of aortic valve: Secondary | ICD-10-CM | POA: Insufficient documentation

## 2017-10-24 DIAGNOSIS — R0600 Dyspnea, unspecified: Secondary | ICD-10-CM | POA: Insufficient documentation

## 2017-10-24 DIAGNOSIS — Q231 Congenital insufficiency of aortic valve: Secondary | ICD-10-CM | POA: Insufficient documentation

## 2017-10-24 DIAGNOSIS — I4510 Unspecified right bundle-branch block: Secondary | ICD-10-CM

## 2017-10-24 DIAGNOSIS — Z87891 Personal history of nicotine dependence: Secondary | ICD-10-CM

## 2017-10-24 LAB — PULMONARY FUNCTION TEST
DL/VA % pred: 46 %
DL/VA: 2.15 ml/min/mmHg/L
DLCO unc % pred: 23 %
DLCO unc: 8 ml/min/mmHg
FEF 25-75 Post: 1.04 L/sec
FEF 25-75 Pre: 0.79 L/sec
FEF2575-%CHANGE-POST: 32 %
FEF2575-%Pred-Post: 47 %
FEF2575-%Pred-Pre: 35 %
FEV1-%Change-Post: 5 %
FEV1-%PRED-PRE: 57 %
FEV1-%Pred-Post: 61 %
FEV1-POST: 1.72 L
FEV1-PRE: 1.63 L
FEV1FVC-%Change-Post: 10 %
FEV1FVC-%Pred-Pre: 88 %
FEV6-%Change-Post: 0 %
FEV6-%PRED-PRE: 64 %
FEV6-%Pred-Post: 64 %
FEV6-POST: 2.35 L
FEV6-PRE: 2.35 L
FEV6FVC-%Change-Post: 3 %
FEV6FVC-%PRED-POST: 105 %
FEV6FVC-%PRED-PRE: 101 %
FVC-%CHANGE-POST: -3 %
FVC-%PRED-PRE: 64 %
FVC-%Pred-Post: 61 %
FVC-POST: 2.35 L
FVC-Pre: 2.45 L
POST FEV6/FVC RATIO: 100 %
Post FEV1/FVC ratio: 73 %
Pre FEV1/FVC ratio: 66 %
Pre FEV6/FVC Ratio: 96 %
RV % PRED: 94 %
RV: 2.55 L
TLC % pred: 70 %
TLC: 5.09 L

## 2017-10-24 MED ORDER — ALBUTEROL SULFATE (2.5 MG/3ML) 0.083% IN NEBU
2.5000 mg | INHALATION_SOLUTION | Freq: Once | RESPIRATORY_TRACT | Status: AC
  Administered 2017-10-24: 2.5 mg via RESPIRATORY_TRACT

## 2017-10-26 ENCOUNTER — Telehealth: Payer: Self-pay

## 2017-10-26 DIAGNOSIS — Q230 Congenital stenosis of aortic valve: Secondary | ICD-10-CM

## 2017-10-26 DIAGNOSIS — Q231 Congenital insufficiency of aortic valve: Principal | ICD-10-CM

## 2017-10-26 NOTE — Telephone Encounter (Signed)
-----   Message from Sueanne Margarita, MD sent at 10/24/2017  4:09 PM EDT ----- Please let patient know that echo showed moderately thickened heart muscle with normal LV function and increased stiffness of the heart weighted to age and aortic stenosis.  There is moderate aortic stenosis.   Compared to an echo a year ago his mean aortic valve gradient has increased from 18 to 24 mmHg.  His dimensionless aortic valve index though is still 0.31 consistent moderate AS.  Pulmonary pressures could not be assessed on this echo.  These repeat 2D echocardiogram in 1 year.

## 2017-10-26 NOTE — Telephone Encounter (Signed)
Spoke with the patient about his lab echo results, advised a repeat echo in 1 year. He expressed understanding and had no further questions.

## 2017-10-28 ENCOUNTER — Encounter: Payer: Self-pay | Admitting: Cardiology

## 2017-11-01 ENCOUNTER — Other Ambulatory Visit: Payer: Self-pay | Admitting: Family Medicine

## 2017-11-01 MED ORDER — BUDESONIDE-FORMOTEROL FUMARATE 80-4.5 MCG/ACT IN AERO: 2.0000 | INHALATION_SPRAY | Freq: Two times a day (BID) | RESPIRATORY_TRACT | 3 refills | Status: DC

## 2017-11-03 ENCOUNTER — Encounter: Payer: Self-pay | Admitting: Family Medicine

## 2017-11-07 ENCOUNTER — Other Ambulatory Visit: Payer: Self-pay

## 2017-11-07 MED ORDER — AMLODIPINE BESYLATE 2.5 MG PO TABS: 2.5000 mg | ORAL_TABLET | Freq: Every day | ORAL | 1 refills | Status: DC

## 2017-11-07 NOTE — Telephone Encounter (Signed)
Pt returned call. Pt states he is not having any other symptoms. Pt states he took his BP yesterday morning and it was not to bad. Pt states he has been soaking his feet every evening. Pt states that his calf and leg is swollen mostly. Pt verbalized understanding of instructions.

## 2017-11-07 NOTE — Telephone Encounter (Signed)
Nurses- I would recommend that this patient reduce his amlodipine New dose 2.5 mg daily #30 with 5 refills This should help reduce some of the swelling he is having More than likely his swelling is a side effect of the amlodipine If the swelling is not getting better with this medication change I would recommend for the patient to do a follow-up office visit Please cancel the amlodipine 5 mg and shifted to the 2.5 and notify patient thank you

## 2017-11-08 ENCOUNTER — Encounter (HOSPITAL_COMMUNITY): Payer: Self-pay

## 2017-11-08 ENCOUNTER — Encounter: Payer: Self-pay | Admitting: Family Medicine

## 2017-11-08 ENCOUNTER — Ambulatory Visit (HOSPITAL_COMMUNITY)
Admission: RE | Admit: 2017-11-08 | Discharge: 2017-11-08 | Disposition: A | Discharge status: Home / Self Care | Payer: Medicare Other | Source: Ambulatory Visit | Attending: Urology | Admitting: Urology

## 2017-11-08 DIAGNOSIS — D4120 Neoplasm of uncertain behavior of unspecified ureter: Secondary | ICD-10-CM | POA: Diagnosis present

## 2017-11-08 DIAGNOSIS — D4100 Neoplasm of uncertain behavior of unspecified kidney: Secondary | ICD-10-CM | POA: Insufficient documentation

## 2017-11-08 LAB — POCT I-STAT CREATININE: Creatinine, Ser: 1.7 mg/dL — ABNORMAL HIGH (ref 0.61–1.24)

## 2017-11-08 MED ORDER — GADOBUTROL 1 MMOL/ML IV SOLN: 9.0000 mL | Freq: Once | INTRAVENOUS | Status: DC | PRN

## 2017-11-08 NOTE — Telephone Encounter (Signed)
The black stool does concern me I would like for this patient follow-up visit if he can do so either this week or early next week at his convenience.  I can see him Wednesday or Friday of this week if he can do so

## 2017-11-08 NOTE — Telephone Encounter (Signed)
Patient is aware of all and was transferred up front to [facility] for an appt this week with Dr.Scott.

## 2017-11-09 ENCOUNTER — Emergency Department (HOSPITAL_COMMUNITY): Payer: Medicare Other

## 2017-11-09 ENCOUNTER — Inpatient Hospital Stay (HOSPITAL_COMMUNITY)
Admission: EM | Admit: 2017-11-09 | Discharge: 2017-11-13 | DRG: 377 | Disposition: A | Discharge status: Home / Self Care | Payer: Medicare Other | Attending: Internal Medicine | Admitting: Internal Medicine

## 2017-11-09 ENCOUNTER — Other Ambulatory Visit: Payer: Self-pay

## 2017-11-09 ENCOUNTER — Encounter: Payer: Self-pay | Admitting: Family Medicine

## 2017-11-09 ENCOUNTER — Ambulatory Visit: Payer: Medicare Other | Admitting: Family Medicine

## 2017-11-09 ENCOUNTER — Other Ambulatory Visit (HOSPITAL_BASED_OUTPATIENT_CLINIC_OR_DEPARTMENT_OTHER): Payer: Self-pay

## 2017-11-09 ENCOUNTER — Other Ambulatory Visit (HOSPITAL_COMMUNITY)
Admission: RE | Admit: 2017-11-09 | Discharge: 2017-11-09 | Disposition: A | Discharge status: Home / Self Care | Payer: Medicare Other | Source: Ambulatory Visit | Attending: Family Medicine | Admitting: Family Medicine

## 2017-11-09 ENCOUNTER — Encounter (HOSPITAL_COMMUNITY): Payer: Self-pay

## 2017-11-09 VITALS — BP 110/68 | Ht 70.5 in | Wt 202.8 lb

## 2017-11-09 DIAGNOSIS — K2991 Gastroduodenitis, unspecified, with bleeding: Secondary | ICD-10-CM | POA: Diagnosis present

## 2017-11-09 DIAGNOSIS — K922 Gastrointestinal hemorrhage, unspecified: Secondary | ICD-10-CM | POA: Diagnosis present

## 2017-11-09 DIAGNOSIS — Z7982 Long term (current) use of aspirin: Secondary | ICD-10-CM

## 2017-11-09 DIAGNOSIS — D649 Anemia, unspecified: Secondary | ICD-10-CM | POA: Diagnosis not present

## 2017-11-09 DIAGNOSIS — I130 Hypertensive heart and chronic kidney disease with heart failure and stage 1 through stage 4 chronic kidney disease, or unspecified chronic kidney disease: Secondary | ICD-10-CM | POA: Diagnosis present

## 2017-11-09 DIAGNOSIS — E785 Hyperlipidemia, unspecified: Secondary | ICD-10-CM | POA: Diagnosis present

## 2017-11-09 DIAGNOSIS — R600 Localized edema: Secondary | ICD-10-CM | POA: Diagnosis not present

## 2017-11-09 DIAGNOSIS — K2990 Gastroduodenitis, unspecified, without bleeding: Secondary | ICD-10-CM

## 2017-11-09 DIAGNOSIS — K253 Acute gastric ulcer without hemorrhage or perforation: Secondary | ICD-10-CM

## 2017-11-09 DIAGNOSIS — D573 Sickle-cell trait: Secondary | ICD-10-CM | POA: Diagnosis present

## 2017-11-09 DIAGNOSIS — K921 Melena: Secondary | ICD-10-CM | POA: Insufficient documentation

## 2017-11-09 DIAGNOSIS — N183 Chronic kidney disease, stage 3 unspecified: Secondary | ICD-10-CM | POA: Diagnosis present

## 2017-11-09 DIAGNOSIS — Z923 Personal history of irradiation: Secondary | ICD-10-CM

## 2017-11-09 DIAGNOSIS — K264 Chronic or unspecified duodenal ulcer with hemorrhage: Secondary | ICD-10-CM | POA: Diagnosis present

## 2017-11-09 DIAGNOSIS — N179 Acute kidney failure, unspecified: Secondary | ICD-10-CM | POA: Diagnosis present

## 2017-11-09 DIAGNOSIS — K2970 Gastritis, unspecified, without bleeding: Secondary | ICD-10-CM

## 2017-11-09 DIAGNOSIS — K254 Chronic or unspecified gastric ulcer with hemorrhage: Principal | ICD-10-CM | POA: Diagnosis present

## 2017-11-09 DIAGNOSIS — K269 Duodenal ulcer, unspecified as acute or chronic, without hemorrhage or perforation: Secondary | ICD-10-CM

## 2017-11-09 DIAGNOSIS — I10 Essential (primary) hypertension: Secondary | ICD-10-CM | POA: Diagnosis present

## 2017-11-09 DIAGNOSIS — D62 Acute posthemorrhagic anemia: Secondary | ICD-10-CM | POA: Diagnosis present

## 2017-11-09 DIAGNOSIS — R0609 Other forms of dyspnea: Secondary | ICD-10-CM | POA: Insufficient documentation

## 2017-11-09 DIAGNOSIS — I350 Nonrheumatic aortic (valve) stenosis: Secondary | ICD-10-CM | POA: Diagnosis present

## 2017-11-09 DIAGNOSIS — E876 Hypokalemia: Secondary | ICD-10-CM | POA: Diagnosis present

## 2017-11-09 DIAGNOSIS — Z87891 Personal history of nicotine dependence: Secondary | ICD-10-CM

## 2017-11-09 DIAGNOSIS — C649 Malignant neoplasm of unspecified kidney, except renal pelvis: Secondary | ICD-10-CM | POA: Diagnosis present

## 2017-11-09 DIAGNOSIS — K449 Diaphragmatic hernia without obstruction or gangrene: Secondary | ICD-10-CM

## 2017-11-09 DIAGNOSIS — I5033 Acute on chronic diastolic (congestive) heart failure: Secondary | ICD-10-CM | POA: Diagnosis present

## 2017-11-09 DIAGNOSIS — H409 Unspecified glaucoma: Secondary | ICD-10-CM | POA: Diagnosis present

## 2017-11-09 DIAGNOSIS — K2971 Gastritis, unspecified, with bleeding: Secondary | ICD-10-CM | POA: Diagnosis present

## 2017-11-09 DIAGNOSIS — J9601 Acute respiratory failure with hypoxia: Secondary | ICD-10-CM

## 2017-11-09 DIAGNOSIS — Z7951 Long term (current) use of inhaled steroids: Secondary | ICD-10-CM

## 2017-11-09 DIAGNOSIS — I5032 Chronic diastolic (congestive) heart failure: Secondary | ICD-10-CM | POA: Diagnosis not present

## 2017-11-09 DIAGNOSIS — Z85118 Personal history of other malignant neoplasm of bronchus and lung: Secondary | ICD-10-CM

## 2017-11-09 LAB — BRAIN NATRIURETIC PEPTIDE
B NATRIURETIC PEPTIDE 5: 272.1 pg/mL — AB (ref 0.0–100.0)
B Natriuretic Peptide: 297 pg/mL — ABNORMAL HIGH (ref 0.0–100.0)

## 2017-11-09 LAB — COMPREHENSIVE METABOLIC PANEL
ALK PHOS: 83 U/L (ref 38–126)
ALT: 26 U/L (ref 0–44)
AST: 34 U/L (ref 15–41)
Albumin: 2.4 g/dL — ABNORMAL LOW (ref 3.5–5.0)
Anion gap: 7 (ref 5–15)
BUN: 35 mg/dL — ABNORMAL HIGH (ref 8–23)
CALCIUM: 8.3 mg/dL — AB (ref 8.9–10.3)
CHLORIDE: 109 mmol/L (ref 98–111)
CO2: 24 mmol/L (ref 22–32)
CREATININE: 1.73 mg/dL — AB (ref 0.61–1.24)
GFR, EST AFRICAN AMERICAN: 41 mL/min — AB (ref >60)
GFR, EST NON AFRICAN AMERICAN: 36 mL/min — AB (ref >60)
Glucose, Bld: 85 mg/dL (ref 70–99)
Potassium: 3.5 mmol/L (ref 3.5–5.1)
Sodium: 140 mmol/L (ref 135–145)
Total Bilirubin: 0.5 mg/dL (ref 0.3–1.2)
Total Protein: 5.3 g/dL — ABNORMAL LOW (ref 6.5–8.1)

## 2017-11-09 LAB — CBC WITH DIFFERENTIAL/PLATELET
Abs Immature Granulocytes: 0.04 10*3/uL (ref 0.00–0.07)
Basophils Absolute: 0 10*3/uL (ref 0.0–0.1)
Basophils Relative: 0 %
EOS ABS: 0.2 10*3/uL (ref 0.0–0.5)
EOS PCT: 2 %
HEMATOCRIT: 28 % — AB (ref 39.0–52.0)
Hemoglobin: 8.6 g/dL — ABNORMAL LOW (ref 13.0–17.0)
Immature Granulocytes: 1 %
LYMPHS ABS: 1 10*3/uL (ref 0.7–4.0)
Lymphocytes Relative: 12 %
MCH: 25.1 pg — AB (ref 26.0–34.0)
MCHC: 30.7 g/dL (ref 30.0–36.0)
MCV: 81.9 fL (ref 80.0–100.0)
MONO ABS: 0.9 10*3/uL (ref 0.1–1.0)
MONOS PCT: 11 %
Neutro Abs: 6.5 10*3/uL (ref 1.7–7.7)
Neutrophils Relative %: 74 %
Platelets: 301 10*3/uL (ref 150–400)
RBC: 3.42 MIL/uL — ABNORMAL LOW (ref 4.22–5.81)
RDW: 15.9 % — AB (ref 11.5–15.5)
WBC: 8.7 10*3/uL (ref 4.0–10.5)
nRBC: 0 % (ref 0.0–0.2)

## 2017-11-09 LAB — CBC
HEMATOCRIT: 27.5 % — AB (ref 39.0–52.0)
Hemoglobin: 8.2 g/dL — ABNORMAL LOW (ref 13.0–17.0)
MCH: 24.6 pg — ABNORMAL LOW (ref 26.0–34.0)
MCHC: 29.8 g/dL — AB (ref 30.0–36.0)
MCV: 82.3 fL (ref 80.0–100.0)
Platelets: 351 10*3/uL (ref 150–400)
RBC: 3.34 MIL/uL — ABNORMAL LOW (ref 4.22–5.81)
RDW: 15.7 % — AB (ref 11.5–15.5)
WBC: 9.2 10*3/uL (ref 4.0–10.5)
nRBC: 0 % (ref 0.0–0.2)

## 2017-11-09 LAB — ABO/RH: ABO/RH(D): B POS

## 2017-11-09 LAB — BASIC METABOLIC PANEL
ANION GAP: 7 (ref 5–15)
BUN: 34 mg/dL — AB (ref 8–23)
CO2: 23 mmol/L (ref 22–32)
CREATININE: 1.62 mg/dL — AB (ref 0.61–1.24)
Calcium: 8.2 mg/dL — ABNORMAL LOW (ref 8.9–10.3)
Chloride: 110 mmol/L (ref 98–111)
GFR calc Af Amer: 45 mL/min — ABNORMAL LOW (ref >60)
GFR, EST NON AFRICAN AMERICAN: 39 mL/min — AB (ref >60)
GLUCOSE: 100 mg/dL — AB (ref 70–99)
Potassium: 3.4 mmol/L — ABNORMAL LOW (ref 3.5–5.1)
SODIUM: 140 mmol/L (ref 135–145)

## 2017-11-09 LAB — PROTIME-INR
INR: 1.09
Prothrombin Time: 14 seconds (ref 11.4–15.2)

## 2017-11-09 LAB — HEPATIC FUNCTION PANEL
ALK PHOS: 83 U/L (ref 38–126)
ALT: 23 U/L (ref 0–44)
AST: 33 U/L (ref 15–41)
Albumin: 2.5 g/dL — ABNORMAL LOW (ref 3.5–5.0)
BILIRUBIN DIRECT: 0.1 mg/dL (ref 0.0–0.2)
BILIRUBIN TOTAL: 0.5 mg/dL (ref 0.3–1.2)
Indirect Bilirubin: 0.4 mg/dL (ref 0.3–0.9)
Total Protein: 5.6 g/dL — ABNORMAL LOW (ref 6.5–8.1)

## 2017-11-09 LAB — POCT HEMOGLOBIN: Hemoglobin: 7.7 g/dL — AB (ref 9.5–13.5)

## 2017-11-09 LAB — I-STAT TROPONIN, ED: Troponin i, poc: 0.02 ng/mL (ref 0.00–0.08)

## 2017-11-09 LAB — FERRITIN: Ferritin: 24 ng/mL (ref 24–336)

## 2017-11-09 MED ORDER — PANTOPRAZOLE SODIUM 40 MG IV SOLR
40.0000 mg | Freq: Once | INTRAVENOUS | Status: AC
  Administered 2017-11-09: 40 mg via INTRAVENOUS
  Filled 2017-11-09 (×2): qty 40

## 2017-11-09 MED ORDER — FUROSEMIDE 20 MG PO TABS: ORAL_TABLET | ORAL | 3 refills | Status: DC

## 2017-11-09 MED ORDER — ONDANSETRON HCL 4 MG PO TABS: 4.0000 mg | ORAL_TABLET | Freq: Four times a day (QID) | ORAL | Status: DC | PRN

## 2017-11-09 MED ORDER — BRIMONIDINE TARTRATE 0.15 % OP SOLN
1.0000 [drp] | Freq: Three times a day (TID) | OPHTHALMIC | Status: DC
  Administered 2017-11-09 – 2017-11-13 (×9): 1 [drp] via OPHTHALMIC
  Filled 2017-11-09: qty 5

## 2017-11-09 MED ORDER — BRINZOLAMIDE 1 % OP SUSP
1.0000 [drp] | Freq: Three times a day (TID) | OPHTHALMIC | Status: DC
  Administered 2017-11-09 – 2017-11-13 (×10): 1 [drp] via OPHTHALMIC
  Filled 2017-11-09: qty 10

## 2017-11-09 MED ORDER — TIMOLOL MALEATE 0.5 % OP SOLN
1.0000 [drp] | Freq: Two times a day (BID) | OPHTHALMIC | Status: DC
  Administered 2017-11-10 – 2017-11-13 (×3): 1 [drp] via OPHTHALMIC
  Filled 2017-11-09 (×2): qty 5

## 2017-11-09 MED ORDER — HYDRALAZINE HCL 20 MG/ML IJ SOLN
5.0000 mg | Freq: Once | INTRAMUSCULAR | Status: AC
  Administered 2017-11-09: 5 mg via INTRAVENOUS
  Filled 2017-11-09: qty 1

## 2017-11-09 MED ORDER — ONDANSETRON HCL 4 MG/2ML IJ SOLN: 4.0000 mg | Freq: Four times a day (QID) | INTRAMUSCULAR | Status: DC | PRN

## 2017-11-09 MED ORDER — PANTOPRAZOLE SODIUM 40 MG IV SOLR
40.0000 mg | Freq: Two times a day (BID) | INTRAVENOUS | Status: DC
  Administered 2017-11-10 (×2): 40 mg via INTRAVENOUS
  Filled 2017-11-09 (×3): qty 40

## 2017-11-09 MED ORDER — PANTOPRAZOLE SODIUM 40 MG PO TBEC: 40.0000 mg | DELAYED_RELEASE_TABLET | Freq: Every day | ORAL | 3 refills | Status: DC

## 2017-11-09 MED ORDER — ACETAMINOPHEN 650 MG RE SUPP: 650.0000 mg | Freq: Four times a day (QID) | RECTAL | Status: DC | PRN

## 2017-11-09 MED ORDER — POLYVINYL ALCOHOL 1.4 % OP SOLN: 1.0000 [drp] | OPHTHALMIC | Status: DC | PRN

## 2017-11-09 MED ORDER — SODIUM CHLORIDE 0.9% FLUSH
3.0000 mL | Freq: Two times a day (BID) | INTRAVENOUS | Status: DC
  Administered 2017-11-09 – 2017-11-13 (×7): 3 mL via INTRAVENOUS

## 2017-11-09 MED ORDER — ACETAMINOPHEN 325 MG PO TABS: 650.0000 mg | ORAL_TABLET | Freq: Four times a day (QID) | ORAL | Status: DC | PRN

## 2017-11-09 MED ORDER — MOMETASONE FURO-FORMOTEROL FUM 100-5 MCG/ACT IN AERO
2.0000 | INHALATION_SPRAY | Freq: Two times a day (BID) | RESPIRATORY_TRACT | Status: DC
  Administered 2017-11-10 – 2017-11-13 (×7): 2 via RESPIRATORY_TRACT
  Filled 2017-11-09: qty 8.8

## 2017-11-09 MED ORDER — NETARSUDIL-LATANOPROST 0.02-0.005 % OP SOLN: 1.0000 [drp] | Freq: Every day | OPHTHALMIC | Status: DC

## 2017-11-09 NOTE — Progress Notes (Signed)
Subjective:    Patient ID: Andrew Guerra, male    DOB: 09/08/1940, 77 y.o.   MRN: [id number]  HPI Patient arrives to discuss recent changes in his stools. Patient also having problems with swelling in his legs. Patient relates a lot of fatigue tiredness feeling rundown he gets short of breath with activity he also relates when he lays back he gets short of breath he finds himself having to sit up to breathe at times other times he states he seems to be doing just fine in addition to this he is noticed black stools have been present over the past several days with intermittent epigastric pain and discomfort Patient has history of prostate cancer lung cancer as well as being evaluated for the possibility of kidney cancer Patient was unable to do MRI yesterday   25 minutes was spent with the patient.  This statement verifies that 25 minutes was indeed spent with the patient.  More than 50% of this visit-total duration of the visit-was spent in counseling and coordination of care. The issues that the patient came in for today as reflected in the diagnosis (s) please refer to documentation for further details.  Patient also has high blood pressure takes medication could be having side effects from that  Review of Systems  Constitutional: Negative for diaphoresis and fatigue.  HENT: Negative for congestion and rhinorrhea.   Respiratory: Positive for shortness of breath. Negative for cough.   Cardiovascular: Positive for leg swelling. Negative for chest pain.  Gastrointestinal: Positive for blood in stool. Negative for abdominal pain and diarrhea.  Skin: Negative for color change and rash.  Neurological: Negative for dizziness and headaches.  Psychiatric/Behavioral: Negative for behavioral problems and confusion.   Patient was told to stop taking 81 mg aspirin    Objective:   Physical Exam  Constitutional: He appears well-nourished. No distress.  HENT:  Head: Normocephalic and atraumatic.    Eyes: Right eye exhibits no discharge. Left eye exhibits no discharge.  Neck: No tracheal deviation present.  Cardiovascular: Normal rate, regular rhythm and normal heart sounds.  No murmur heard. Pulmonary/Chest: Effort normal and breath sounds normal. No respiratory distress.  Musculoskeletal: He exhibits no edema.  Lymphadenopathy:    He has no cervical adenopathy.  Neurological: He is alert. Coordination normal.  Skin: Skin is warm and dry.  Psychiatric: He has a normal mood and affect. His behavior is normal.  Vitals reviewed. Rectal exam enlarged prostate Positive Hemoccult        Assessment & Plan:  Melena-Hemoccult positive Significant anemia 7.7 hemoglobin Stat lab work indicated Pedal edema possibly related to medications- cannot rule out the possibility of intra-abdominal pathology causing pressure on venous return causing bilateral swelling will need to change in diuretics Will need follow-up Will go ahead and send in PPI and also change her diuretics 1 stat lab work comes back patient may well need to be admitted into the hospital he lives in [place] he would prefer [facility] We offered to allow him to go to the hospital here in [place] currently but patient defers so therefore we will do lab tests first then move forward from there  Also the patient's BNP is slightly elevated he has a history of aortic stenosis as well as diastolic dysfunction given that he is having some pedal edema there is also probable some fluid  Pedal edema could also be related to low protein patient's appetite is not good.  This patient is a cancer survivor  that is under active surveillance by [facility] as well as Andrew Guerra  I called to talk with the provider.  I spoke with the ER triage nurse.  They are expecting the patient.  Because of the melena the low hemoglobin and shortness of breath and elevated BNP it is not felt that this can be worked up as an  outpatient therefore the patient was referred for further evaluation to [facility]-the patient voiced understanding  The patient may need a unit of blood may also need urgent EGD and further testing of work-up

## 2017-11-09 NOTE — ED Notes (Signed)
Pt 89% on RA. Pt placed on 2L [place]

## 2017-11-09 NOTE — H&P (Signed)
History and Physical    Andrew Guerra IRW:[id number] DOB: [birth year] DOA: 11/09/2017  PCP: [name], MD   Patient coming from: Home   Chief Complaint: Melena, SOB   HPI: Andrew Guerra is a 77 y.o. male with medical history significant for chronic kidney disease stage III, hypertension, chronic diastolic CHF, and history of lung cancer status post resection and radiation, now presenting to the emergency department for evaluation of melena.  Patient reports that he has been experiencing increased shortness of breath with bilateral leg swelling for the past couple months, but he had otherwise been well until 11/07/2017 when he had a black tarry stool.  He has gone on to have melena since that time, denies abdominal pain, and denies nausea, vomiting, or indigestion.  He takes a daily baby aspirin and is prescribed Protonix but does not take it.  He had a normal colonoscopy in 2017 and states that he has never had EGD performed.  Denies any history of similar symptoms.  He saw his PCP and had positive FOBT in the clinic and labs were sent revealing a drop in hemoglobin to 8 from 12 in February.  ED Course: Upon arrival to the ED, patient is found to be afebrile, saturating well on room air, and with vitals otherwise normal.  EKG features a sinus rhythm with RBBB and LP FB.  Chest x-ray is negative for acute cardiopulmonary disease.  Chemistry panel is notable for a creatinine 1.73, up from 1.4 in February.  CBC is notable for a normocytic anemia with hemoglobin 8.2, down from 12.3 in February.  Troponin is normal and BNP is elevated 272.  Patient was given 40 mg IV Protonix in the ED.  He remains hemodynamically stable and will be observed on the telemetry unit for ongoing evaluation and management of suspected upper GI bleed.  Review of Systems:  All other systems reviewed and apart from HPI, are negative.  Past Medical History:  Diagnosis Date  . 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  [name] Cataract    right eye - surgery to remove  . Elevated PSA 11/29/2016   [facility] is [facility].  Most recent PSA near 11.  He has had atypia on biopsy.  November 2008  . Essential hypertension, benign 06/08/2012  . Full dentures   . Glaucoma   . Heart murmur    since childhood, never has caused any problems  . History of rheumatic fever 11/04/2015  . Hyperlipidemia 04/25/2013  . Hypertension   . Malignant neoplasm of left lung ([place]) 02/08/2017  . RBBB 07/12/2017  . Sickle cell trait ([place])    no problems per patient    Past Surgical History:  Procedure Laterality Date  . cataract eye surgery Right   . COLONOSCOPY  11/2009   hx polyps/[place]  . PROSTATE BIOPSY    . right eye surgery     to lower eye pressure  . TONSILLECTOMY    . VIDEO ASSISTED THORACOSCOPY (VATS)/ LOBECTOMY Right 12/26/2015  . wisdom tteeth ext       reports that he quit smoking about 6 years ago. His smoking use included cigarettes. He smoked 1.00 pack per day. He has never used smokeless tobacco. He reports that he drinks about 1.0 standard drinks of alcohol per week. He reports that he does not use drugs.  Allergies  Allergen  Reactions  . Atorvastatin     Achy and tired    Family History  Problem Relation Age of Onset  . Diabetes Mother   . Cerebral aneurysm Mother   . Lung cancer Father   . Diabetes Sister   . Colon cancer Neg Hx   . Colon polyps Neg Hx   . Esophageal cancer Neg Hx   . Rectal cancer Neg Hx   . Stomach cancer Neg Hx      Prior to Admission medications   Medication Sig Start Date End Date Taking? Authorizing Provider  amLODipine (NORVASC) 2.5 MG tablet Take 1 tablet (2.5 mg total) by mouth daily. 11/07/17  Yes [name], MD  aspirin EC 81 MG tablet Take 81 mg by mouth daily.   Yes [provider]  Brinzolamide-Brimonidine  (SIMBRINZA) 1-0.2 % SUSP Place 2-3 drops into both eyes 3 (three) times daily.    Yes [provider]  budesonide-formoterol (SYMBICORT) 80-4.5 MCG/ACT inhaler Inhale 2 puffs into the lungs 2 (two) times daily. 11/01/17  Yes [name], MD  furosemide (LASIX) 20 MG tablet 1 or 2 tablets each morning as needed for swelling Patient taking differently: Take 20 mg by mouth daily.  11/09/17  Yes [name], MD  hydrALAZINE (APRESOLINE) 25 MG tablet Take 1 tablet (25 mg total) by mouth 3 (three) times daily. 10/06/17  Yes [name], MD  metoprolol succinate (TOPROL-XL) 50 MG 24 hr tablet Take 1/2 tablet daily.Take with or immediately following a meal. 06/23/17  Yes Luking, Elayne Snare, MD  Multiple Vitamin (MULTIVITAMIN) tablet Take 1 tablet by mouth daily.   Yes [provider]  Netarsudil-Latanoprost (ROCKLATAN) 0.02-0.005 % SOLN Place 1 drop into both eyes at bedtime.    Yes Marylynn Pearson, MD  timolol (BETIMOL) 0.5 % ophthalmic solution Place 1 drop into both eyes 2 (two) times daily.    Yes [provider]  pantoprazole (PROTONIX) 40 MG tablet Take 1 tablet (40 mg total) by mouth daily. Patient not taking: Reported on 11/09/2017 11/09/17   [name], MD    Physical Exam: Vitals:   11/09/17 1654 11/09/17 1938 11/09/17 2100 11/09/17 2100  BP:  (!) 149/77 (!) 162/98 (!) 175/83  Pulse:  76 79 79  Resp:  16 17 18   Temp:  98.6 F (37 C)    TempSrc:  Oral    SpO2:  94% 94% 95%  Weight: 91.6 kg     Height: 5\' 10"  (1.778 m)       Constitutional: NAD, calm  Eyes: PERTLA, lids and conjunctivae normal ENMT: Mucous membranes are moist. Posterior pharynx clear of any exudate or lesions.   Neck: normal, supple, no masses, no thyromegaly Respiratory: clear to auscultation bilaterally, no wheezing, no crackles. Mild dyspnea with speech. No accessory muscle use.  Cardiovascular: S1 & S2 heard, regular rate and rhythm. Pitting edema involving bilateral LE's and  extending into thighs. Abdomen: Soft, non-distended, no tenderness. Bowel sounds active.  Musculoskeletal: no clubbing / cyanosis. No joint deformity upper and lower extremities.    Skin: no significant rashes, lesions, ulcers. Warm, dry, well-perfused. Neurologic: No facial asymmetry. Sensation intact. Moving all extremities.  Psychiatric: Alert and oriented to person, place, and situation. Pleasant and cooperative.    Labs on Admission: I have personally reviewed following labs and imaging studies  CBC: Recent Labs  Lab 11/09/17 1147 11/09/17 1234 11/09/17 1703  WBC  --  8.7 9.2  NEUTROABS  --  6.5  --  HGB 7.7* 8.6* 8.2*  HCT  --  28.0* 27.5*  MCV  --  81.9 82.3  PLT  --  301 921   Basic Metabolic Panel: Recent Labs  Lab 11/08/17 0947 11/09/17 1234 11/09/17 1703  NA  --  140 140  K  --  3.4* 3.5  CL  --  110 109  CO2  --  23 24  GLUCOSE  --  100* 85  BUN  --  34* 35*  CREATININE 1.70* 1.62* 1.73*  CALCIUM  --  8.2* 8.3*   GFR: Estimated Creatinine Clearance: 39.4 mL/min (A) (by C-G formula based on SCr of 1.73 mg/dL (H)). Liver Function Tests: Recent Labs  Lab 11/09/17 1234 11/09/17 1703  AST 33 34  ALT 23 26  ALKPHOS 83 83  BILITOT 0.5 0.5  PROT 5.6* 5.3*  ALBUMIN 2.5* 2.4*   No results for input(s): LIPASE, AMYLASE in the last 168 hours. No results for input(s): AMMONIA in the last 168 hours. Coagulation Profile: No results for input(s): INR, PROTIME in the last 168 hours. Cardiac Enzymes: No results for input(s): CKTOTAL, CKMB, CKMBINDEX, TROPONINI in the last 168 hours. BNP (last 3 results) No results for input(s): PROBNP in the last 8760 hours. HbA1C: No results for input(s): HGBA1C in the last 72 hours. CBG: No results for input(s): GLUCAP in the last 168 hours. Lipid Profile: No results for input(s): CHOL, HDL, LDLCALC, TRIG, CHOLHDL, LDLDIRECT in the last 72 hours. Thyroid Function Tests: No results for input(s): TSH, T4TOTAL, FREET4,  T3FREE, THYROIDAB in the last 72 hours. Anemia Panel: Recent Labs    11/09/17 1234  FERRITIN 24   Urine analysis: No results found for: COLORURINE, APPEARANCEUR, LABSPEC, PHURINE, GLUCOSEU, HGBUR, BILIRUBINUR, KETONESUR, PROTEINUR, UROBILINOGEN, NITRITE, LEUKOCYTESUR Sepsis Labs: @LABRCNTIP (procalcitonin:4,lacticidven:4) )No results found for this or any previous visit (from the past 240 hour(s)).   Radiological Exams on Admission: Dg Chest 2 View  Result Date: 11/09/2017 CLINICAL DATA:  Black loose stools, short of breath EXAM: CHEST - 2 VIEW COMPARISON:  PET CT 11/25/2015, chest x-ray 10/30/2007, CT chest 11/11/2015 FINDINGS: Interval postsurgical changes of the right hilar region. No acute opacity or pleural effusion. Heart size within normal limits. No pneumothorax. IMPRESSION: No active cardiopulmonary disease. Interval postsurgical changes in the right perihilar region and right middle lobe. Electronically Signed   By: Donavan Foil M.D.   On: 11/09/2017 17:41    EKG: Independently reviewed. Sinus rhythm, RBBB, LPFB.   Assessment/Plan  1. Upper GI bleeding; anemia  - Presents with melena since 10/21, found to be hemodynamically stable with Hgb 8.2 (12.3 in February)  - He denies pain, N/V, or indigestion, and denies any red or maroon blood  - Never had EGD, takes baby ASA daily, prescribed PPI but hasn't been taking  - Type and screen, hold ASA, start IV PPI, trend H&H   2. Chronic diastolic CHF  - There is marked BLE edema that patient reports to be stable, he is slightly dyspneic with speech, but no rales detected or edema on CXR  - Echo done earlier this month with preserved EF, no WMA's, mild LVH, and grade 1 diastolic dysfunction  - Hold Lasix while NPO but keep saline-locked for now given apparent hypervolemia, follow daily wt and I/O's    3. Hypertension  - BP at goal in ED  - Plan to hold his antihypertensives initially given concern for UGIB and potential for  worsening - Treat as-needed for now   4. CKD III   -  SCr is 1.73 on admission, up from 1.4 in February  - Renally-dose medications, avoid nephrotoxins, repeat chemistries in am    5. History of lung cancer  - Status post resection of resection and radiation in 2017 - Follows with oncology and for active surveillance     DVT prophylaxis: SCD's  Code Status: Full  Family Communication: Wife updated at bedside Consults called: None Admission status: observation     Vianne Bulls, MD [facility] Pager [phone]  If 7PM-7AM, please contact night-coverage [email or web address] Password [email or web address]  11/09/2017, 9:15 PM

## 2017-11-09 NOTE — ED Triage Notes (Signed)
Pt. Was seen at PCP today for SOB and dark tarry stools. Pt reports the black stool started on the 22nd of this month, and SOB has been ongoing for a few months. Pt has been placed on Symbicort for his SOB. PCP reports hgb of 8.6 and elevated BNP.

## 2017-11-09 NOTE — ED Provider Notes (Signed)
[place] EMERGENCY DEPARTMENT Provider Note   CSN: [id number] Arrival date & time: 11/09/17  1635     History   Chief Complaint Chief Complaint  Patient presents with  . Shortness of Breath  . Melena    HPI Andrew Guerra is a 77 y.o. male.  HPI  77 year old male presents with melena and increasing shortness of breath.  The patient was sent to the ER by his PCP for concern for GI bleed.  Starting 2 days ago he has noticed black stools that first were pretty soft and now are firming up.  No abdominal pain.  He takes a baby aspirin but no other NSAIDs or aspirins used.  He is not on other blood thinners.  He denies any chest pain or vomiting.  He has chronic shortness of breath for the last 2 months or so that is progressively getting worse.  He has chronic lower externally edema that also seems to be a little worse.  While he was in the office, a rectal exam was performed and was positive for occult blood.  Past Medical History:  Diagnosis Date  . 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  [name] Cataract    right eye - surgery to remove  . Elevated PSA 11/29/2016   [facility].  Most recent PSA near 11.  He has had atypia on biopsy.  November 2008  . Essential hypertension, benign 06/08/2012  . Full dentures   . Glaucoma   . Heart murmur    since childhood, never has caused any problems  . History of rheumatic fever 11/04/2015  . Hyperlipidemia 04/25/2013  . Hypertension   . Malignant neoplasm of left lung ([place]) 02/08/2017  . RBBB 07/12/2017  . Sickle cell trait ([place])    no problems per patient    Patient Active Problem List   Diagnosis Date Noted  . RBBB 07/12/2017  . 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 Malignant neoplasm of left lung ([place]) 02/08/2017  . Elevated PSA 11/29/2016  . Heart  murmur 11/04/2015  . History of active rheumatic fever 11/04/2015  . Solitary pulmonary nodule 08/31/2013  . 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 Thyroid nodule 08/31/2013  . Hyperlipidemia 04/25/2013  . Essential hypertension, benign 06/08/2012    Past Surgical History:  Procedure Laterality Date  . cataract eye surgery Right   . COLONOSCOPY  11/2009   hx polyps/[place]  . PROSTATE BIOPSY    . right eye surgery     to lower eye pressure  . TONSILLECTOMY    . VIDEO ASSISTED THORACOSCOPY (VATS)/ LOBECTOMY Right 12/26/2015  . wisdom tteeth ext          Home Medications    Prior to Admission medications   Medication Sig Start Date End Date Taking? Authorizing Provider  amLODipine (NORVASC) 2.5 MG tablet Take 1 tablet (2.5 mg total) by mouth daily. 11/07/17   [name], MD  Brinzolamide-Brimonidine [facility]) 1-0.2 % SUSP Apply to eye.    [provider]  budesonide-formoterol (SYMBICORT) 80-4.5 MCG/ACT inhaler Inhale 2 puffs into the lungs 2 (two) times daily. 11/01/17   [name], MD  Flaxseed, Linseed, (FLAX SEEDS) POWD Take by mouth.    [provider]  furosemide (LASIX) 20 MG tablet 1 or 2 tablets each morning as needed for swelling  11/09/17   [name], MD  hydrALAZINE (APRESOLINE) 25 MG tablet Take 1 tablet (25 mg total) by mouth 3 (three) times daily. 10/06/17   [name], MD  metoprolol succinate (TOPROL-XL) 50 MG 24 hr tablet Take 1/2 tablet daily.Take with or immediately following a meal. 06/23/17   [name], MD  Multiple Vitamin (MULTIVITAMIN) tablet Take 1 tablet by mouth daily.    [provider]  Netarsudil-Latanoprost (ROCKLATAN) 0.02-0.005 % SOLN Apply to eye.    Marylynn Pearson, MD  pantoprazole (PROTONIX) 40 MG tablet Take 1 tablet (40 mg total) by mouth daily. 11/09/17   [name], MD  timolol (BETIMOL) 0.5 % ophthalmic solution 1 drop 2 (two) times daily.    [provider]  Shirley Friar 5 %  SOLN PLACE 1 DROP INTO EACH EYE TWICE A DAY 09/16/15   [provider]    Family History Family History  Problem Relation Age of Onset  . Diabetes Mother   . Cerebral aneurysm Mother   . Lung cancer Father   . Diabetes Sister   . Colon cancer Neg Hx   . Colon polyps Neg Hx   . Esophageal cancer Neg Hx   . Rectal cancer Neg Hx   . Stomach cancer Neg Hx     Social History Social History   Tobacco Use  . Smoking status: Former Smoker    Packs/day: 1.00    Types: Cigarettes    Last attempt to quit: 01/19/2011    Years since quitting: 6.8  . Smokeless tobacco: Never Used  Substance Use Topics  . Alcohol use: Yes    Alcohol/week: 1.0 standard drinks    Types: 1 Shots of liquor per week  . Drug use: No     Allergies   Atorvastatin   Review of Systems Review of Systems  Constitutional: Negative for fever.  Respiratory: Positive for shortness of breath.   Cardiovascular: Negative for chest pain.  Gastrointestinal: Positive for blood in stool. Negative for abdominal pain and vomiting.  All other systems reviewed and are negative.    Physical Exam Updated Vital Signs BP (!) 149/77 (BP Location: Right Arm)   Pulse 76   Temp 98.6 F (37 C) (Oral)   Resp 16   Ht 5\' 10"  (1.778 m)   Wt 91.6 kg   SpO2 94%   BMI 28.98 kg/m   Physical Exam  Constitutional: He appears well-developed and well-nourished.  Non-toxic appearance. He does not appear ill.  HENT:  Head: Normocephalic and atraumatic.  Right Ear: External ear normal.  Left Ear: External ear normal.  Nose: Nose normal.  Eyes: Right eye exhibits no discharge. Left eye exhibits no discharge.  Neck: Neck supple.  Cardiovascular: Normal rate and regular rhythm.  Murmur heard. Pulmonary/Chest: Effort normal and breath sounds normal.  Abdominal: Soft. There is no tenderness.  Musculoskeletal:       Right lower leg: He exhibits edema.       Left lower leg: He exhibits edema.  Neurological: He is alert.    Skin: Skin is warm and dry.  Psychiatric: His mood appears not anxious.  Nursing note and vitals reviewed.    ED Treatments / Results  Labs (all labs ordered are listed, but only abnormal results are displayed) Labs Reviewed  CBC - Abnormal; Notable for the following components:      Result Value   RBC 3.34 (*)    Hemoglobin 8.2 (*)    HCT 27.5 (*)  MCH 24.6 (*)    MCHC 29.8 (*)    RDW 15.7 (*)    All other components within normal limits  COMPREHENSIVE METABOLIC PANEL - Abnormal; Notable for the following components:   BUN 35 (*)    Creatinine, Ser 1.73 (*)    Calcium 8.3 (*)    Total Protein 5.3 (*)    Albumin 2.4 (*)    GFR calc non Af Amer 36 (*)    GFR calc Af Amer 41 (*)    All other components within normal limits  BRAIN NATRIURETIC PEPTIDE - Abnormal; Notable for the following components:   B Natriuretic Peptide 272.1 (*)    All other components within normal limits  PROTIME-INR  I-STAT TROPONIN, ED  TYPE AND SCREEN  ABO/RH    EKG EKG Interpretation  Date/Time:  Wednesday November 09 2017 16:54:29 EDT Ventricular Rate:  68 PR Interval:  184 QRS Duration: 156 QT Interval:  458 QTC Calculation: 487 R Axis:   132 Text Interpretation:  Normal sinus rhythm Possible Left atrial enlargement Indeterminate axis Right bundle branch block Possible Inferior infarct , age undetermined Anterior infarct , age undetermined Abnormal ECG Poor data quality in current ECG precludes serial comparison Confirmed by Sherwood Gambler [phone]) on 11/09/2017 8:20:12 PM   Radiology Dg Chest 2 View  Result Date: 11/09/2017 CLINICAL DATA:  Black loose stools, short of breath EXAM: CHEST - 2 VIEW COMPARISON:  PET CT 11/25/2015, chest x-ray 10/30/2007, CT chest 11/11/2015 FINDINGS: Interval postsurgical changes of the right hilar region. No acute opacity or pleural effusion. Heart size within normal limits. No pneumothorax. IMPRESSION: No active cardiopulmonary disease. Interval  postsurgical changes in the right perihilar region and right middle lobe. Electronically Signed   By: Donavan Foil M.D.   On: 11/09/2017 17:41    Procedures Procedures (including critical care time)  Medications Ordered in ED Medications  pantoprazole (PROTONIX) injection 40 mg (has no administration in time range)     Initial Impression / Assessment and Plan / ED Course  I have reviewed the triage vital signs and the nursing notes.  Pertinent labs & imaging results that were available during my care of the patient were reviewed by me and considered in my medical decision making (see chart for details).     Patient appears to have symptomatic anemia from melena/GI bleed.  He is not unstable and his hemoglobin is still above 8 and so I do not think he needs emergent blood transfusion.  However he will need GI work-up and GI consult in the morning.  Dr. Myna Hidalgo to admit.  He was given a dose of IV Protonix.  Final Clinical Impressions(s) / ED Diagnoses   Final diagnoses:  Melena    ED Discharge Orders    None       Sherwood Gambler, MD 11/09/17 2347

## 2017-11-09 NOTE — ED Notes (Signed)
Attempted report 

## 2017-11-09 NOTE — ED Provider Notes (Signed)
Patient placed in Quick Look pathway, seen and evaluated   Chief Complaint: black tarry stools  HPI: Andrew Guerra is a 77 y.o. male who presents to the ED with c/o black tarry stools that started a couple days ago and feeling short of breath that has been chronic. Patient using Symbicort for SOB. Patient went to see his PCP today and was sent to [facility] due to severe anemia, rectal bleeding and elevated BNP.   ROS: GI: rectal bleeding  Resp: shortness of breath  Physical Exam:  BP (!) 153/64 (BP Location: Right Arm)   Pulse 69   Temp 98.1 F (36.7 C) (Oral)   Resp 18   Ht 5\' 10"  (1.778 m)   Wt 91.6 kg   SpO2 100%   BMI 28.98 kg/m    Gen: No distress  Neuro: Awake and Alert  Skin: Warm and dry   Initiation of care has begun. The patient has been counseled on the process, plan, and necessity for staying for the completion/evaluation, and the remainder of the medical screening examination    Ashley Murrain, NP 11/09/17 1710    Mesner, Corene Cornea, MD 11/09/17 2316

## 2017-11-10 DIAGNOSIS — K921 Melena: Secondary | ICD-10-CM | POA: Diagnosis not present

## 2017-11-10 DIAGNOSIS — D649 Anemia, unspecified: Secondary | ICD-10-CM | POA: Diagnosis not present

## 2017-11-10 DIAGNOSIS — K922 Gastrointestinal hemorrhage, unspecified: Secondary | ICD-10-CM

## 2017-11-10 DIAGNOSIS — I10 Essential (primary) hypertension: Secondary | ICD-10-CM | POA: Diagnosis not present

## 2017-11-10 DIAGNOSIS — I5032 Chronic diastolic (congestive) heart failure: Secondary | ICD-10-CM | POA: Diagnosis not present

## 2017-11-10 LAB — BASIC METABOLIC PANEL
Anion gap: 7 (ref 5–15)
BUN: 32 mg/dL — ABNORMAL HIGH (ref 8–23)
CO2: 21 mmol/L — ABNORMAL LOW (ref 22–32)
Calcium: 7.9 mg/dL — ABNORMAL LOW (ref 8.9–10.3)
Chloride: 112 mmol/L — ABNORMAL HIGH (ref 98–111)
Creatinine, Ser: 1.57 mg/dL — ABNORMAL HIGH (ref 0.61–1.24)
GFR calc non Af Amer: 40 mL/min — ABNORMAL LOW (ref >60)
GFR, EST AFRICAN AMERICAN: 47 mL/min — AB (ref >60)
Glucose, Bld: 94 mg/dL (ref 70–99)
POTASSIUM: 3.2 mmol/L — AB (ref 3.5–5.1)
Sodium: 140 mmol/L (ref 135–145)

## 2017-11-10 LAB — HEMOGLOBIN
HEMOGLOBIN: 7.4 g/dL — AB (ref 13.0–17.0)
HEMOGLOBIN: 8.7 g/dL — AB (ref 13.0–17.0)
Hemoglobin: 7.5 g/dL — ABNORMAL LOW (ref 13.0–17.0)

## 2017-11-10 LAB — PREPARE RBC (CROSSMATCH)

## 2017-11-10 LAB — HEMATOCRIT
HCT: 24.3 % — ABNORMAL LOW (ref 39.0–52.0)
HEMATOCRIT: 23.4 % — AB (ref 39.0–52.0)

## 2017-11-10 LAB — GLUCOSE, CAPILLARY: GLUCOSE-CAPILLARY: 87 mg/dL (ref 70–99)

## 2017-11-10 MED ORDER — SODIUM CHLORIDE 0.9% IV SOLUTION
Freq: Once | INTRAVENOUS | Status: AC
  Administered 2017-11-10: 15:00:00 via INTRAVENOUS

## 2017-11-10 MED ORDER — METOPROLOL SUCCINATE ER 25 MG PO TB24
25.0000 mg | ORAL_TABLET | Freq: Every day | ORAL | Status: DC
  Administered 2017-11-11 – 2017-11-13 (×3): 25 mg via ORAL
  Filled 2017-11-10 (×3): qty 1

## 2017-11-10 MED ORDER — ENSURE ENLIVE PO LIQD
237.0000 mL | Freq: Two times a day (BID) | ORAL | Status: DC
  Administered 2017-11-10 – 2017-11-13 (×5): 237 mL via ORAL

## 2017-11-10 MED ORDER — FUROSEMIDE 10 MG/ML IJ SOLN
20.0000 mg | Freq: Once | INTRAMUSCULAR | Status: AC
  Administered 2017-11-10: 20 mg via INTRAVENOUS
  Filled 2017-11-10: qty 2

## 2017-11-10 MED ORDER — LIFITEGRAST 5 % OP SOLN
1.0000 [drp] | Freq: Two times a day (BID) | OPHTHALMIC | Status: DC
  Administered 2017-11-10 – 2017-11-13 (×4): 1 [drp] via OPHTHALMIC

## 2017-11-10 MED ORDER — METOPROLOL SUCCINATE ER 25 MG PO TB24: 25.0000 mg | ORAL_TABLET | Freq: Every day | ORAL | Status: DC

## 2017-11-10 NOTE — H&P (View-Only) (Signed)
[facility] Consult: 10:27 AM 11/10/2017  LOS: 0 days    Referring Provider: DR Eliseo Squires  Primary Care Physician:  [name], MD Primary Gastroenterologist:  Dr. Henrene Pastor     Reason for Consultation:  GI bleed, anemia.     HPI: Andrew Guerra is a 77 y.o. male.  PMH  Liver cysts.  Renal cysts.  CKD 3.  Diastolic CHF.  Pretension.  Lung cancer treated with RUL resection 12/2015 and radiation.  LVEF 60 -89%, grade 1 diastolic dysfunction, aortic valve calcification on 10/24/2017 echocardiogram.  Benign thyroid nodule 01/2014.  Heart murmur, aortic stenosis, history rheumatic fever.  Sickle cell trait.  Glaucoma.  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   Colonoscopies in 2006, 2011, 03/2015 3 small, hyperplastic polyps 2006 Diminutive, mild serrated, polyp and hypertrophied anal papillae 2011 Colonoscopy in 03/2015 normal. Has never had upper endoscopy.  In the last 4 to 6 weeks patient has developed dyspnea on exertion and difficulty performing activities that normally were not an issue.  He had been indoor cycling 3 times a week but in the last few weeks he has dropped this activity.  Normally can climb 15 stairs at home and this is become difficult as well.  No cough.  Primary care physician has adjusted his diuretics because of bilateral lower extremety edema over the last couple of months. Anorexia for about 3 weeks.  Just not getting hungry.  No nausea, vomiting, abdominal pain, heartburn symptoms.  No dysphagia.   On 11/07/2017 he had a large, tarry black stool.  The next day he had a smaller, still dark but not tarry stool.  He has not had any bowel movements since then. Takes 81 mg aspirin.  Does not take PPI.  Hemodynamically stable.  Hgb 7.7 >> 8.6 >> 8.2 >> 7.5 >> 7.4.  Hgb in 08/2016  -11/2016 was ~ 12.   Apparently at PMD the Hgb was 12.3 in 02/2017. PT/INR 14/1.0 AKI, GFR mid 40s.  Parallel elevation of both BUN/creatinine. Elevated BNP.    No active cardiopulmonary disease on 2 view chest films yesterday.    Past Medical History:  Diagnosis Date  . 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  [name] Cataract    right eye - surgery to remove  . Elevated PSA 11/29/2016   Patient is followed by [facility].  Most recent PSA near 11.  He has had atypia on biopsy.  November 2008  . Essential hypertension, benign 06/08/2012  . Full dentures   . Glaucoma   . Heart murmur    since childhood, never has caused any problems  . History of rheumatic fever 11/04/2015  . Hyperlipidemia 04/25/2013  . Hypertension   . Malignant neoplasm of left lung ([place]) 02/08/2017  . RBBB 07/12/2017  . Sickle cell trait ([place])    no problems per patient    Past Surgical History:  Procedure Laterality Date  . cataract eye surgery Right   .  COLONOSCOPY  11/2009   hx polyps/[place]  . PROSTATE BIOPSY    . right eye surgery     to lower eye pressure  . TONSILLECTOMY    . VIDEO ASSISTED THORACOSCOPY (VATS)/ LOBECTOMY Right 12/26/2015  . wisdom tteeth ext      Prior to Admission medications   Medication Sig Start Date End Date Taking? Authorizing Provider  amLODipine (NORVASC) 2.5 MG tablet Take 1 tablet (2.5 mg total) by mouth daily. 11/07/17  Yes [name], MD  aspirin EC 81 MG tablet Take 81 mg by mouth daily.   Yes [provider]  Brinzolamide-Brimonidine (SIMBRINZA) 1-0.2 % SUSP Place 2-3 drops into both eyes 3 (three) times daily.    Yes [provider]  budesonide-formoterol (SYMBICORT) 80-4.5 MCG/ACT inhaler Inhale 2 puffs into the lungs 2 (two) times daily. 11/01/17  Yes [name], MD  furosemide (LASIX) 20 MG tablet 1 or 2 tablets each morning as needed  for swelling Patient taking differently: Take 20 mg by mouth daily.  11/09/17  Yes [name], MD  hydrALAZINE (APRESOLINE) 25 MG tablet Take 1 tablet (25 mg total) by mouth 3 (three) times daily. 10/06/17  Yes [name], MD  metoprolol succinate (TOPROL-XL) 50 MG 24 hr tablet Take 1/2 tablet daily.Take with or immediately following a meal. 06/23/17  Yes Luking, Elayne Snare, MD  Multiple Vitamin (MULTIVITAMIN) tablet Take 1 tablet by mouth daily.   Yes [provider]  Netarsudil-Latanoprost (ROCKLATAN) 0.02-0.005 % SOLN Place 1 drop into both eyes at bedtime.    Yes Marylynn Pearson, MD  timolol (BETIMOL) 0.5 % ophthalmic solution Place 1 drop into both eyes 2 (two) times daily.    Yes [provider]  pantoprazole (PROTONIX) 40 MG tablet Take 1 tablet (40 mg total) by mouth daily. Patient not taking: Reported on 11/09/2017 11/09/17   [name], MD    Scheduled Meds: . brimonidine  1 drop Both Eyes TID  . brinzolamide  1 drop Both Eyes TID  . Lifitegrast  1 drop Both Eyes BID  . [START ON 11/11/2017] metoprolol succinate  25 mg Oral Daily  . mometasone-formoterol  2 puff Inhalation BID  . Netarsudil-Latanoprost  1 drop Both Eyes QHS  . pantoprazole  40 mg Intravenous Q12H  . sodium chloride flush  3 mL Intravenous Q12H  . timolol  1 drop Both Eyes BID   Infusions:  PRN Meds: acetaminophen **OR** acetaminophen, ondansetron **OR** ondansetron (ZOFRAN) IV   Allergies as of 11/09/2017 - Review Complete 11/09/2017  Allergen Reaction Noted  . Atorvastatin  07/31/2017    Family History  Problem Relation Age of Onset  . Diabetes Mother   . Cerebral aneurysm Mother   . Lung cancer Father   . Diabetes Sister   . Colon cancer Neg Hx   . Colon polyps Neg Hx   . Esophageal cancer Neg Hx   . Rectal cancer Neg Hx   . Stomach cancer Neg Hx     Social History   Socioeconomic History  . Marital status: Married    Spouse name: Not on file  . Number of  children: Not on file  . Years of education: Not on file  . Highest education level: Not on file  Occupational History  . Not on file  Social Needs  . Financial resource strain: Not on file  . Food insecurity:    Worry: Not on file    Inability: Not on file  .  Transportation needs:    Medical: Not on file    Non-medical: Not on file  Tobacco Use  . Smoking status: Former Smoker    Packs/day: 1.00    Types: Cigarettes    Last attempt to quit: 01/19/2011    Years since quitting: 6.8  . Smokeless tobacco: Never Used  Substance and Sexual Activity  . Alcohol use: Yes    Alcohol/week: 1.0 standard drinks    Types: 1 Shots of liquor per week  . Drug use: No  . Sexual activity: Not on file  Lifestyle  . Physical activity:    Days per week: Not on file    Minutes per session: Not on file  . Stress: Not on file  Relationships  . Social connections:    Talks on phone: Not on file    Gets together: Not on file    Attends religious service: Not on file    Active member of club or organization: Not on file    Attends meetings of clubs or organizations: Not on file    Relationship status: Not on file  . Intimate partner violence:    Fear of current or ex partner: Not on file    Emotionally abused: Not on file    Physically abused: Not on file    Forced sexual activity: Not on file  Other Topics Concern  . Not on file  Social History Narrative  . Not on file    REVIEW OF SYSTEMS: Constitutional:  Per HPI ENT:  No nose bleeds Pulm:  Per HPI CV:  No palpitations, no LE edema.  GU:  No hematuria, no frequency GI:  Per HPI Heme: Other than the dark stool few days ago, has not had any unusual or excessive bleeding/bruising. Transfusions: Never undergone blood cell, blood product transfusions. Neuro:  No headaches, no peripheral tingling or numbness.  No syncope or seizures. Derm:  No itching, no rash or sores.  Endocrine:  No sweats or chills.  No polyuria or  dysuria Immunization: Did not inquire as to recent vaccinations.   PHYSICAL EXAM: Vital signs in last 24 hours: Vitals:   11/10/17 0733 11/10/17 0918  BP: 134/65   Pulse: 74   Resp: 18   Temp: 98.3 F (36.8 C)   SpO2: 94% 93%   Wt Readings from Last 3 Encounters:  11/10/17 89.6 kg  11/09/17 92 kg  10/06/17 88.9 kg    General: Pleasant, elderly, looks mildly chronically ill.  Alert.  Good historian. Head: No facial asymmetry or swelling.  No signs of head trauma. Eyes: Ptosis on the right eyelid.  No conjunctival pallor.  No scleral icterus. Ears: Not hard of hearing Nose: No congestion or discharge Mouth: Full dentures in place.  Not removed.  Tongue midline.  Oral mucosa pink, moist, clear. Neck: No JVD, no masses, no thyromegaly. Lungs: Clear bilaterally.  Breath sounds absent in the right upper lung.  No dyspnea or cough at rest. Heart: RRR.  Slight systolic murmur.  S1, S2 present Abdomen: Not tender, not distended.  Active bowel sounds.  No HSM, masses, bruits, hernias..   Rectal: Deferred rectal exam.  Was FOBT positive at his PMD office yesterday. Musc/Skeltl: No joint redness, swelling or significant deformity. Extremities: Bilateral lower extremity edema.  Pitting 1+. Neurologic: Fully alert and oriented.  Good historian.  No tremors or limb weakness. Skin: No sores.  Pigmentation changes and dry appearance to the skin on his upper back consistent with previous radiation therapy. Tattoos:  None observed. Nodes: No cervical adenopathy. Psych: Calm, pleasant, cooperative.  Fluid speech.  Intake/Output from previous day: 10/23 0701 - 10/24 0700 In: -  Out: 100 [Urine:100] Intake/Output this shift: No intake/output data recorded.  LAB RESULTS: Recent Labs    11/09/17 1234 11/09/17 1703 11/10/17 0124 11/10/17 0804  WBC 8.7 9.2  --   --   HGB 8.6* 8.2* 7.5* 7.4*  HCT 28.0* 27.5* 24.3* 23.4*  PLT 301 351  --   --    BMET Lab Results  Component Value Date    NA 140 11/10/2017   NA 140 11/09/2017   NA 140 11/09/2017   K 3.2 (L) 11/10/2017   K 3.5 11/09/2017   K 3.4 (L) 11/09/2017   CL 112 (H) 11/10/2017   CL 109 11/09/2017   CL 110 11/09/2017   CO2 21 (L) 11/10/2017   CO2 24 11/09/2017   CO2 23 11/09/2017   GLUCOSE 94 11/10/2017   GLUCOSE 85 11/09/2017   GLUCOSE 100 (H) 11/09/2017   BUN 32 (H) 11/10/2017   BUN 35 (H) 11/09/2017   BUN 34 (H) 11/09/2017   CREATININE 1.57 (H) 11/10/2017   CREATININE 1.73 (H) 11/09/2017   CREATININE 1.62 (H) 11/09/2017   CALCIUM 7.9 (L) 11/10/2017   CALCIUM 8.3 (L) 11/09/2017   CALCIUM 8.2 (L) 11/09/2017   LFT Recent Labs    11/09/17 1234 11/09/17 1703  PROT 5.6* 5.3*  ALBUMIN 2.5* 2.4*  AST 33 34  ALT 23 26  ALKPHOS 83 83  BILITOT 0.5 0.5  BILIDIR 0.1  --   IBILI 0.4  --    PT/INR Lab Results  Component Value Date   INR 1.09 11/09/2017   Hepatitis Panel No results for input(s): HEPBSAG, HCVAB, HEPAIGM, HEPBIGM in the last 72 hours. C-Diff No components found for: CDIFF Lipase  No results found for: LIPASE  Drugs of Abuse  No results found for: LABOPIA, COCAINSCRNUR, LABBENZ, AMPHETMU, THCU, LABBARB   RADIOLOGY STUDIES: Dg Chest 2 View  Result Date: 11/09/2017 CLINICAL DATA:  Black loose stools, short of breath EXAM: CHEST - 2 VIEW COMPARISON:  PET CT 11/25/2015, chest x-ray 10/30/2007, CT chest 11/11/2015 FINDINGS: Interval postsurgical changes of the right hilar region. No acute opacity or pleural effusion. Heart size within normal limits. No pneumothorax. IMPRESSION: No active cardiopulmonary disease. Interval postsurgical changes in the right perihilar region and right middle lobe. Electronically Signed   By: Donavan Foil M.D.   On: 11/09/2017 17:41     IMPRESSION:   *   Upper GI bleed.  Rule out ulcers, AVMs, neoplasia. Bleeding consisting only of dark, tarry stools on 10/21 and smaller, non-tarry, stool on 1022.  No stools since.  *    Normocytic anemia.  *     History of lung cancer treated with resection and radiation.   Experiencing new dyspnea on exertion in last 4 to 6 weeks.  This may be combined etiology due to lung disease and anemia, ? Role of CHF?[name]  *    Elevated BNP.  Lower extremity edema.  Echocardiogram as above.  Benign chest films.   PLAN:     *   EGD tomorrow.  Continue the twice daily IV Protonix. Continue to monitor blood counts Okay to have full liquids today. Consider transfusion given his c/o DOE in recent weeks   Azucena Freed  11/10/2017, 10:27 AM Phone [phone]

## 2017-11-10 NOTE — Consult Note (Signed)
[facility] Consult: 10:27 AM 11/10/2017  LOS: 0 days    Referring Provider: DR Eliseo Squires  Primary Care Physician:  [name], MD Primary Gastroenterologist:  Dr. Henrene Pastor     Reason for Consultation:  GI bleed, anemia.     HPI: Andrew Guerra is a 77 y.o. male.  PMH  Liver cysts.  Renal cysts.  CKD 3.  Diastolic CHF.  Pretension.  Lung cancer treated with RUL resection 12/2015 and radiation.  LVEF 60 -20%, grade 1 diastolic dysfunction, aortic valve calcification on 10/24/2017 echocardiogram.  Benign thyroid nodule 01/2014.  Heart murmur, aortic stenosis, history rheumatic fever.  Sickle cell trait.  Glaucoma.  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   Colonoscopies in 2006, 2011, 03/2015 3 small, hyperplastic polyps 2006 Diminutive, mild serrated, polyp and hypertrophied anal papillae 2011 Colonoscopy in 03/2015 normal. Has never had upper endoscopy.  In the last 4 to 6 weeks patient has developed dyspnea on exertion and difficulty performing activities that normally were not an issue.  He had been indoor cycling 3 times a week but in the last few weeks he has dropped this activity.  Normally can climb 15 stairs at home and this is become difficult as well.  No cough.  Primary care physician has adjusted his diuretics because of bilateral lower extremety edema over the last couple of months. Anorexia for about 3 weeks.  Just not getting hungry.  No nausea, vomiting, abdominal pain, heartburn symptoms.  No dysphagia.   On 11/07/2017 he had a large, tarry black stool.  The next day he had a smaller, still dark but not tarry stool.  He has not had any bowel movements since then. Takes 81 mg aspirin.  Does not take PPI.  Hemodynamically stable.  Hgb 7.7 >> 8.6 >> 8.2 >> 7.5 >> 7.4.  Hgb in 08/2016  -11/2016 was ~ 12.   Apparently at PMD the Hgb was 12.3 in 02/2017. PT/INR 14/1.0 AKI, GFR mid 40s.  Parallel elevation of both BUN/creatinine. Elevated BNP.    No active cardiopulmonary disease on 2 view chest films yesterday.    Past Medical History:  Diagnosis Date  . 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  [name] Cataract    right eye - surgery to remove  . Elevated PSA 11/29/2016   Patient is followed by [facility].  Most recent PSA near 11.  He has had atypia on biopsy.  November 2008  . Essential hypertension, benign 06/08/2012  . Full dentures   . Glaucoma   . Heart murmur    since childhood, never has caused any problems  . History of rheumatic fever 11/04/2015  . Hyperlipidemia 04/25/2013  . Hypertension   . Malignant neoplasm of left lung ([place]) 02/08/2017  . RBBB 07/12/2017  . Sickle cell trait ([place])    no problems per patient    Past Surgical History:  Procedure Laterality Date  . cataract eye surgery Right   .  COLONOSCOPY  11/2009   hx polyps/[place]  . PROSTATE BIOPSY    . right eye surgery     to lower eye pressure  . TONSILLECTOMY    . VIDEO ASSISTED THORACOSCOPY (VATS)/ LOBECTOMY Right 12/26/2015  . wisdom tteeth ext      Prior to Admission medications   Medication Sig Start Date End Date Taking? Authorizing Provider  amLODipine (NORVASC) 2.5 MG tablet Take 1 tablet (2.5 mg total) by mouth daily. 11/07/17  Yes [name], MD  aspirin EC 81 MG tablet Take 81 mg by mouth daily.   Yes [provider]  Brinzolamide-Brimonidine (SIMBRINZA) 1-0.2 % SUSP Place 2-3 drops into both eyes 3 (three) times daily.    Yes [provider]  budesonide-formoterol (SYMBICORT) 80-4.5 MCG/ACT inhaler Inhale 2 puffs into the lungs 2 (two) times daily. 11/01/17  Yes [name], MD  furosemide (LASIX) 20 MG tablet 1 or 2 tablets each morning as needed  for swelling Patient taking differently: Take 20 mg by mouth daily.  11/09/17  Yes [name], MD  hydrALAZINE (APRESOLINE) 25 MG tablet Take 1 tablet (25 mg total) by mouth 3 (three) times daily. 10/06/17  Yes [name], MD  metoprolol succinate (TOPROL-XL) 50 MG 24 hr tablet Take 1/2 tablet daily.Take with or immediately following a meal. 06/23/17  Yes Luking, Elayne Snare, MD  Multiple Vitamin (MULTIVITAMIN) tablet Take 1 tablet by mouth daily.   Yes [provider]  Netarsudil-Latanoprost (ROCKLATAN) 0.02-0.005 % SOLN Place 1 drop into both eyes at bedtime.    Yes Marylynn Pearson, MD  timolol (BETIMOL) 0.5 % ophthalmic solution Place 1 drop into both eyes 2 (two) times daily.    Yes [provider]  pantoprazole (PROTONIX) 40 MG tablet Take 1 tablet (40 mg total) by mouth daily. Patient not taking: Reported on 11/09/2017 11/09/17   [name], MD    Scheduled Meds: . brimonidine  1 drop Both Eyes TID  . brinzolamide  1 drop Both Eyes TID  . Lifitegrast  1 drop Both Eyes BID  . [START ON 11/11/2017] metoprolol succinate  25 mg Oral Daily  . mometasone-formoterol  2 puff Inhalation BID  . Netarsudil-Latanoprost  1 drop Both Eyes QHS  . pantoprazole  40 mg Intravenous Q12H  . sodium chloride flush  3 mL Intravenous Q12H  . timolol  1 drop Both Eyes BID   Infusions:  PRN Meds: acetaminophen **OR** acetaminophen, ondansetron **OR** ondansetron (ZOFRAN) IV   Allergies as of 11/09/2017 - Review Complete 11/09/2017  Allergen Reaction Noted  . Atorvastatin  07/31/2017    Family History  Problem Relation Age of Onset  . Diabetes Mother   . Cerebral aneurysm Mother   . Lung cancer Father   . Diabetes Sister   . Colon cancer Neg Hx   . Colon polyps Neg Hx   . Esophageal cancer Neg Hx   . Rectal cancer Neg Hx   . Stomach cancer Neg Hx     Social History   Socioeconomic History  . Marital status: Married    Spouse name: Not on file  . Number of  children: Not on file  . Years of education: Not on file  . Highest education level: Not on file  Occupational History  . Not on file  Social Needs  . Financial resource strain: Not on file  . Food insecurity:    Worry: Not on file    Inability: Not on file  .  Transportation needs:    Medical: Not on file    Non-medical: Not on file  Tobacco Use  . Smoking status: Former Smoker    Packs/day: 1.00    Types: Cigarettes    Last attempt to quit: 01/19/2011    Years since quitting: 6.8  . Smokeless tobacco: Never Used  Substance and Sexual Activity  . Alcohol use: Yes    Alcohol/week: 1.0 standard drinks    Types: 1 Shots of liquor per week  . Drug use: No  . Sexual activity: Not on file  Lifestyle  . Physical activity:    Days per week: Not on file    Minutes per session: Not on file  . Stress: Not on file  Relationships  . Social connections:    Talks on phone: Not on file    Gets together: Not on file    Attends religious service: Not on file    Active member of club or organization: Not on file    Attends meetings of clubs or organizations: Not on file    Relationship status: Not on file  . Intimate partner violence:    Fear of current or ex partner: Not on file    Emotionally abused: Not on file    Physically abused: Not on file    Forced sexual activity: Not on file  Other Topics Concern  . Not on file  Social History Narrative  . Not on file    REVIEW OF SYSTEMS: Constitutional:  Per HPI ENT:  No nose bleeds Pulm:  Per HPI CV:  No palpitations, no LE edema.  GU:  No hematuria, no frequency GI:  Per HPI Heme: Other than the dark stool few days ago, has not had any unusual or excessive bleeding/bruising. Transfusions: Never undergone blood cell, blood product transfusions. Neuro:  No headaches, no peripheral tingling or numbness.  No syncope or seizures. Derm:  No itching, no rash or sores.  Endocrine:  No sweats or chills.  No polyuria or  dysuria Immunization: Did not inquire as to recent vaccinations.   PHYSICAL EXAM: Vital signs in last 24 hours: Vitals:   11/10/17 0733 11/10/17 0918  BP: 134/65   Pulse: 74   Resp: 18   Temp: 98.3 F (36.8 C)   SpO2: 94% 93%   Wt Readings from Last 3 Encounters:  11/10/17 89.6 kg  11/09/17 92 kg  10/06/17 88.9 kg    General: Pleasant, elderly, looks mildly chronically ill.  Alert.  Good historian. Head: No facial asymmetry or swelling.  No signs of head trauma. Eyes: Ptosis on the right eyelid.  No conjunctival pallor.  No scleral icterus. Ears: Not hard of hearing Nose: No congestion or discharge Mouth: Full dentures in place.  Not removed.  Tongue midline.  Oral mucosa pink, moist, clear. Neck: No JVD, no masses, no thyromegaly. Lungs: Clear bilaterally.  Breath sounds absent in the right upper lung.  No dyspnea or cough at rest. Heart: RRR.  Slight systolic murmur.  S1, S2 present Abdomen: Not tender, not distended.  Active bowel sounds.  No HSM, masses, bruits, hernias..   Rectal: Deferred rectal exam.  Was FOBT positive at his PMD office yesterday. Musc/Skeltl: No joint redness, swelling or significant deformity. Extremities: Bilateral lower extremity edema.  Pitting 1+. Neurologic: Fully alert and oriented.  Good historian.  No tremors or limb weakness. Skin: No sores.  Pigmentation changes and dry appearance to the skin on his upper back consistent with previous radiation therapy. Tattoos:  None observed. Nodes: No cervical adenopathy. Psych: Calm, pleasant, cooperative.  Fluid speech.  Intake/Output from previous day: 10/23 0701 - 10/24 0700 In: -  Out: 100 [Urine:100] Intake/Output this shift: No intake/output data recorded.  LAB RESULTS: Recent Labs    11/09/17 1234 11/09/17 1703 11/10/17 0124 11/10/17 0804  WBC 8.7 9.2  --   --   HGB 8.6* 8.2* 7.5* 7.4*  HCT 28.0* 27.5* 24.3* 23.4*  PLT 301 351  --   --    BMET Lab Results  Component Value Date    NA 140 11/10/2017   NA 140 11/09/2017   NA 140 11/09/2017   K 3.2 (L) 11/10/2017   K 3.5 11/09/2017   K 3.4 (L) 11/09/2017   CL 112 (H) 11/10/2017   CL 109 11/09/2017   CL 110 11/09/2017   CO2 21 (L) 11/10/2017   CO2 24 11/09/2017   CO2 23 11/09/2017   GLUCOSE 94 11/10/2017   GLUCOSE 85 11/09/2017   GLUCOSE 100 (H) 11/09/2017   BUN 32 (H) 11/10/2017   BUN 35 (H) 11/09/2017   BUN 34 (H) 11/09/2017   CREATININE 1.57 (H) 11/10/2017   CREATININE 1.73 (H) 11/09/2017   CREATININE 1.62 (H) 11/09/2017   CALCIUM 7.9 (L) 11/10/2017   CALCIUM 8.3 (L) 11/09/2017   CALCIUM 8.2 (L) 11/09/2017   LFT Recent Labs    11/09/17 1234 11/09/17 1703  PROT 5.6* 5.3*  ALBUMIN 2.5* 2.4*  AST 33 34  ALT 23 26  ALKPHOS 83 83  BILITOT 0.5 0.5  BILIDIR 0.1  --   IBILI 0.4  --    PT/INR Lab Results  Component Value Date   INR 1.09 11/09/2017   Hepatitis Panel No results for input(s): HEPBSAG, HCVAB, HEPAIGM, HEPBIGM in the last 72 hours. C-Diff No components found for: CDIFF Lipase  No results found for: LIPASE  Drugs of Abuse  No results found for: LABOPIA, COCAINSCRNUR, LABBENZ, AMPHETMU, THCU, LABBARB   RADIOLOGY STUDIES: Dg Chest 2 View  Result Date: 11/09/2017 CLINICAL DATA:  Black loose stools, short of breath EXAM: CHEST - 2 VIEW COMPARISON:  PET CT 11/25/2015, chest x-ray 10/30/2007, CT chest 11/11/2015 FINDINGS: Interval postsurgical changes of the right hilar region. No acute opacity or pleural effusion. Heart size within normal limits. No pneumothorax. IMPRESSION: No active cardiopulmonary disease. Interval postsurgical changes in the right perihilar region and right middle lobe. Electronically Signed   By: Donavan Foil M.D.   On: 11/09/2017 17:41     IMPRESSION:   *   Upper GI bleed.  Rule out ulcers, AVMs, neoplasia. Bleeding consisting only of dark, tarry stools on 10/21 and smaller, non-tarry, stool on 1022.  No stools since.  *    Normocytic anemia.  *     History of lung cancer treated with resection and radiation.   Experiencing new dyspnea on exertion in last 4 to 6 weeks.  This may be combined etiology due to lung disease and anemia, ? Role of CHF?[name]  *    Elevated BNP.  Lower extremity edema.  Echocardiogram as above.  Benign chest films.   PLAN:     *   EGD tomorrow.  Continue the twice daily IV Protonix. Continue to monitor blood counts Okay to have full liquids today. Consider transfusion given his c/o DOE in recent weeks   Azucena Freed  11/10/2017, 10:27 AM Phone [phone]

## 2017-11-10 NOTE — Progress Notes (Addendum)
Initial Nutrition Assessment  DOCUMENTATION CODES:   Not applicable  INTERVENTION:  Once diet advanced:  Ensure Enlive BID. Each supplement provides 350 kcal and 20 grams protein.    NUTRITION DIAGNOSIS:   Inadequate oral intake related to decreased appetite as evidenced by per patient/family report.  GOAL:   Patient will meet greater than or equal to 90% of their needs  MONITOR:   PO intake, Supplement acceptance, Diet advancement, Weight trends, Labs, I & O's  REASON FOR ASSESSMENT:   Malnutrition Screening Tool    ASSESSMENT:   Andrew Guerra is a 77 yo male with PMH CKD III, HTN, diastolic CHF, lung cancer s/p resection and radiation 2017, admitted for evaluation of melena and bilateral LE edema.  Visited pt at bedside with wife present. Pt reports decreased appetite for the last 2-3 weeks. Prior to that time appetite was normal for him.  Intake at home: Breakfast: eggs, [place] bacon or cheerios and banana Lunch: sandwich Dinner: wife cooks meat, starch, vegetable Beverages: coffee, water, OJ, milk, soda sometimes General MVI  Pt and his wife report that he's lost "a couple pounds" in last 2-3 weeks. Obtained bed wt of 91.3 kg (201 lbs). Pt reports his usual body wt is 194 lbs.  Pt with significant edema of LE. Unable to decipher true wt trends.  Pt at risk for malnutrition due to recent poor po intake and hx lung and prostate cancers, being evaluated for possible kidney cancer.  Pt has been NPO or full liquid since admission 10/23. Pt to be NPO tomorrow 10/24 for EGD. Will provide Ensure BID once diet advanced.   Labs: potassium 3.2 Meds: Protonix  NUTRITION - FOCUSED PHYSICAL EXAM:    Most Recent Value  Orbital Region  No depletion  Upper Arm Region  Mild depletion  Thoracic and Lumbar Region  No depletion  Buccal Region  No depletion  Temple Region  Mild depletion  Clavicle Bone Region  Moderate depletion  Clavicle and Acromion Bone Region  Mild  depletion  Scapular Bone Region  No depletion  Dorsal Hand  No depletion  Patellar Region  Unable to assess  Anterior Thigh Region  No depletion  Posterior Calf Region  Unable to assess [edemous]   Edema (RD Assessment)  Severe  Hair  Reviewed  Eyes  Reviewed  Mouth  Reviewed  Skin  Reviewed  Nails  Reviewed       Diet Order:   Diet Order            Diet NPO time specified Except for: Ice Chips  Diet effective midnight        Diet full liquid Room service appropriate? Yes; Fluid consistency: Thin  Diet effective now              EDUCATION NEEDS:   No education needs have been identified at this time  Skin:  Skin Assessment: Reviewed RN Assessment  Last BM:  10/23  Height:   Ht Readings from Last 1 Encounters:  11/09/17 5\' 10"  (1.778 m)    Weight:   Wt Readings from Last 1 Encounters:  11/10/17 91.3 kg    Ideal Body Weight:  75.5 kg  BMI:  Body mass index is 28.88 kg/m.  Estimated Nutritional Needs:   Kcal:  0947-0962  Protein:  100-115 gms  Fluid:  2.0-2.3 L    Rigel Filsinger, Dietetic Intern [phone]

## 2017-11-10 NOTE — Progress Notes (Signed)
Patient was oriented to the unit. Patient is stable. BP is elevated. MD has been paged. Patient has no complaints at this time. Will continue to monitor.

## 2017-11-10 NOTE — Progress Notes (Signed)
Progress Note    DAVONE SHINAULT  JFH:[id number] DOB: 09-16-1940  DOA: 11/09/2017 PCP: [name], MD    Brief Narrative:     Medical records reviewed and are as summarized below:  Andrew Guerra is an 77 y.o. male with medical history significant for chronic kidney disease stage III, hypertension, chronic diastolic CHF, and history of lung cancer status post resection and radiation, now presenting to the emergency department for evaluation of melena.  Patient reports that he has been experiencing increased shortness of breath with bilateral leg swelling for the past couple months, but he had otherwise been well until 11/07/2017 when he had a black tarry stool.  He has gone on to have melena since that time  Assessment/Plan:   Principal Problem:   Upper GI bleeding Active Problems:   Essential hypertension, benign   Renal malignant tumor (HCC)   CKD (chronic kidney disease), stage III (HCC)   Normocytic anemia   Chronic diastolic CHF (congestive heart failure) (HCC)   Melena   Upper GI bleeding; symptomatic anemia  - Presents with melena since 10/21 -Hgb <8 (12.3 in February)  - Never had EGD, takes baby ASA daily, prescribed PPI but hasn't been taking  - GI consult -suspect will need transfusion as symptomatic with dyspnea requiring O2  Chronic diastolic CHF  - There is marked BLE edema that patient reports to be stable, he is slightly dyspneic with speech, but no rales detected or edema on CXR  - Echo done earlier this month with preserved EF, no WMA's, mild LVH, and grade 1 diastolic dysfunction  - resume lasix after GI evaluation  Bicuspid AV -follow outpatient with cardiology, recent echo  Hypokalemia -replete when able to take PO  Hypertension  - resume BP meds as able  CKD III   - SCr is 1.73 on admission -baseline around 1.4    History of lung cancer  - s/p resection and radiation in 2017 - outpatient oncology follow up     Family  Communication/Anticipated D/C date and plan/Code Status   DVT prophylaxis: scd Code Status: Full Code.  Family Communication: none Disposition Plan: pending   Medical Consultants:    GI     Subjective:   Still gets very SOB with movement/activity No chest pain  Objective:    Vitals:   11/10/17 0005 11/10/17 0410 11/10/17 0733 11/10/17 0918  BP: (!) 155/71 (!) 142/68 134/65   Pulse: 78 77 74   Resp: 18 18 18    Temp: 98.4 F (36.9 C) 98.4 F (36.9 C) 98.3 F (36.8 C)   TempSrc: Oral Oral Oral   SpO2: 98% 96% 94% 93%  Weight:  89.6 kg    Height:        Intake/Output Summary (Last 24 hours) at 11/10/2017 0956 Last data filed at 11/10/2017 0414 Gross per 24 hour  Intake -  Output 100 ml  Net -100 ml   Filed Weights   11/09/17 1654 11/10/17 0410  Weight: 91.6 kg 89.6 kg    Exam: In bed, on 2L O2 No wheezing but does appear dyspneic when talking Rrr, + murmur +LE edema +BS, soft Right eyelid droop  Data Reviewed:   I have personally reviewed following labs and imaging studies:  Labs: Labs show the following:   Basic Metabolic Panel: Recent Labs  Lab 11/08/17 0947  11/09/17 1234 11/09/17 1703 11/10/17 0124  NA  --   --  140 140 140  K  --    < >  3.4* 3.5 3.2*  CL  --   --  110 109 112*  CO2  --   --  23 24 21*  GLUCOSE  --   --  100* 85 94  BUN  --   --  34* 35* 32*  CREATININE 1.70*  --  1.62* 1.73* 1.57*  CALCIUM  --   --  8.2* 8.3* 7.9*   < > = values in this interval not displayed.   GFR Estimated Creatinine Clearance: 43 mL/min (A) (by C-G formula based on SCr of 1.57 mg/dL (H)). Liver Function Tests: Recent Labs  Lab 11/09/17 1234 11/09/17 1703  AST 33 34  ALT 23 26  ALKPHOS 83 83  BILITOT 0.5 0.5  PROT 5.6* 5.3*  ALBUMIN 2.5* 2.4*   No results for input(s): LIPASE, AMYLASE in the last 168 hours. No results for input(s): AMMONIA in the last 168 hours. Coagulation profile Recent Labs  Lab 11/09/17 1703  INR 1.09     CBC: Recent Labs  Lab 11/09/17 1147 11/09/17 1234 11/09/17 1703 11/10/17 0124 11/10/17 0804  WBC  --  8.7 9.2  --   --   NEUTROABS  --  6.5  --   --   --   HGB 7.7* 8.6* 8.2* 7.5* 7.4*  HCT  --  28.0* 27.5* 24.3* 23.4*  MCV  --  81.9 82.3  --   --   PLT  --  301 351  --   --    Cardiac Enzymes: No results for input(s): CKTOTAL, CKMB, CKMBINDEX, TROPONINI in the last 168 hours. BNP (last 3 results) No results for input(s): PROBNP in the last 8760 hours. CBG: Recent Labs  Lab 11/10/17 0813  GLUCAP 87   D-Dimer: No results for input(s): DDIMER in the last 72 hours. Hgb A1c: No results for input(s): HGBA1C in the last 72 hours. Lipid Profile: No results for input(s): CHOL, HDL, LDLCALC, TRIG, CHOLHDL, LDLDIRECT in the last 72 hours. Thyroid function studies: No results for input(s): TSH, T4TOTAL, T3FREE, THYROIDAB in the last 72 hours.  Invalid input(s): FREET3 Anemia work up: Recent Labs    11/09/17 1234  FERRITIN 24   Sepsis Labs: Recent Labs  Lab 11/09/17 1234 11/09/17 1703  WBC 8.7 9.2    Microbiology No results found for this or any previous visit (from the past 240 hour(s)).  Procedures and diagnostic studies:  Dg Chest 2 View  Result Date: 11/09/2017 CLINICAL DATA:  Black loose stools, short of breath EXAM: CHEST - 2 VIEW COMPARISON:  PET CT 11/25/2015, chest x-ray 10/30/2007, CT chest 11/11/2015 FINDINGS: Interval postsurgical changes of the right hilar region. No acute opacity or pleural effusion. Heart size within normal limits. No pneumothorax. IMPRESSION: No active cardiopulmonary disease. Interval postsurgical changes in the right perihilar region and right middle lobe. Electronically Signed   By: Donavan Foil M.D.   On: 11/09/2017 17:41    Medications:   . brimonidine  1 drop Both Eyes TID  . brinzolamide  1 drop Both Eyes TID  . Lifitegrast  1 drop Both Eyes BID  . mometasone-formoterol  2 puff Inhalation BID  .  Netarsudil-Latanoprost  1 drop Both Eyes QHS  . pantoprazole  40 mg Intravenous Q12H  . sodium chloride flush  3 mL Intravenous Q12H  . timolol  1 drop Both Eyes BID   Continuous Infusions:   LOS: 0 days   Geradine Girt  [facility]   *Please refer to [email or web address], password [email or web address] to  get updated schedule on who will round on this patient, as hospitalists switch teams weekly. If 7PM-7AM, please contact night-coverage at [email or web address], password [email or web address] for any overnight needs.  11/10/2017, 9:56 AM

## 2017-11-10 NOTE — Plan of Care (Signed)
  Problem: Education: Goal: Knowledge of General Education information will improve Description Including pain rating scale, medication(s)/side effects and non-pharmacologic comfort measures Outcome: Progressing   Problem: Health Behavior/Discharge Planning: Goal: Ability to manage health-related needs will improve Outcome: Progressing   Problem: Clinical Measurements: Goal: Respiratory complications will improve Outcome: Progressing   Problem: Safety: Goal: Ability to remain free from injury will improve Outcome: Progressing

## 2017-11-11 ENCOUNTER — Encounter (HOSPITAL_COMMUNITY)
Admission: EM | Disposition: A | Discharge status: Home / Self Care | Payer: Self-pay | Source: Home / Self Care | Attending: Internal Medicine

## 2017-11-11 ENCOUNTER — Encounter (HOSPITAL_COMMUNITY): Payer: Self-pay | Admitting: Anesthesiology

## 2017-11-11 ENCOUNTER — Observation Stay (HOSPITAL_COMMUNITY): Payer: Medicare Other | Admitting: Anesthesiology

## 2017-11-11 DIAGNOSIS — K2970 Gastritis, unspecified, without bleeding: Secondary | ICD-10-CM

## 2017-11-11 DIAGNOSIS — D649 Anemia, unspecified: Secondary | ICD-10-CM | POA: Diagnosis not present

## 2017-11-11 DIAGNOSIS — K253 Acute gastric ulcer without hemorrhage or perforation: Secondary | ICD-10-CM | POA: Diagnosis not present

## 2017-11-11 DIAGNOSIS — K2990 Gastroduodenitis, unspecified, without bleeding: Secondary | ICD-10-CM

## 2017-11-11 DIAGNOSIS — K922 Gastrointestinal hemorrhage, unspecified: Secondary | ICD-10-CM | POA: Diagnosis not present

## 2017-11-11 DIAGNOSIS — B9681 Helicobacter pylori [H. pylori] as the cause of diseases classified elsewhere: Secondary | ICD-10-CM

## 2017-11-11 DIAGNOSIS — K449 Diaphragmatic hernia without obstruction or gangrene: Secondary | ICD-10-CM | POA: Diagnosis not present

## 2017-11-11 DIAGNOSIS — I10 Essential (primary) hypertension: Secondary | ICD-10-CM | POA: Diagnosis not present

## 2017-11-11 DIAGNOSIS — K921 Melena: Secondary | ICD-10-CM | POA: Diagnosis not present

## 2017-11-11 DIAGNOSIS — K3189 Other diseases of stomach and duodenum: Secondary | ICD-10-CM

## 2017-11-11 DIAGNOSIS — K269 Duodenal ulcer, unspecified as acute or chronic, without hemorrhage or perforation: Secondary | ICD-10-CM | POA: Diagnosis not present

## 2017-11-11 HISTORY — PX: BIOPSY: SHX5522

## 2017-11-11 HISTORY — PX: ESOPHAGOGASTRODUODENOSCOPY: SHX5428

## 2017-11-11 LAB — BASIC METABOLIC PANEL
ANION GAP: 6 (ref 5–15)
BUN: 29 mg/dL — ABNORMAL HIGH (ref 8–23)
CALCIUM: 8.2 mg/dL — AB (ref 8.9–10.3)
CO2: 25 mmol/L (ref 22–32)
CREATININE: 1.82 mg/dL — AB (ref 0.61–1.24)
Chloride: 111 mmol/L (ref 98–111)
GFR calc Af Amer: 39 mL/min — ABNORMAL LOW (ref >60)
GFR, EST NON AFRICAN AMERICAN: 34 mL/min — AB (ref >60)
Glucose, Bld: 144 mg/dL — ABNORMAL HIGH (ref 70–99)
Potassium: 3.3 mmol/L — ABNORMAL LOW (ref 3.5–5.1)
Sodium: 142 mmol/L (ref 135–145)

## 2017-11-11 LAB — BPAM RBC
Blood Product Expiration Date: 201911032359
Blood Product Expiration Date: 201911132359
ISSUE DATE / TIME: 201910241424
ISSUE DATE / TIME: 201910242017
UNIT TYPE AND RH: 7300
Unit Type and Rh: 7300

## 2017-11-11 LAB — TYPE AND SCREEN
ABO/RH(D): B POS
Antibody Screen: NEGATIVE
Unit division: 0
Unit division: 0

## 2017-11-11 LAB — GLUCOSE, CAPILLARY
GLUCOSE-CAPILLARY: 84 mg/dL (ref 70–99)
Glucose-Capillary: 89 mg/dL (ref 70–99)

## 2017-11-11 LAB — HEMOGLOBIN AND HEMATOCRIT, BLOOD
HCT: 29.2 % — ABNORMAL LOW (ref 39.0–52.0)
HEMOGLOBIN: 9.3 g/dL — AB (ref 13.0–17.0)

## 2017-11-11 LAB — HEMOGLOBIN: HEMOGLOBIN: 10 g/dL — AB (ref 13.0–17.0)

## 2017-11-11 SURGERY — EGD (ESOPHAGOGASTRODUODENOSCOPY)
Anesthesia: Monitor Anesthesia Care

## 2017-11-11 MED ORDER — PROPOFOL 500 MG/50ML IV EMUL
INTRAVENOUS | Status: DC | PRN
  Administered 2017-11-11: 100 ug/kg/min via INTRAVENOUS

## 2017-11-11 MED ORDER — PHENYLEPHRINE HCL 10 MG/ML IJ SOLN
INTRAMUSCULAR | Status: DC | PRN
  Administered 2017-11-11 (×2): 80 ug via INTRAVENOUS
  Administered 2017-11-11: 120 ug via INTRAVENOUS

## 2017-11-11 MED ORDER — PROPOFOL 10 MG/ML IV BOLUS
INTRAVENOUS | Status: DC | PRN
  Administered 2017-11-11 (×2): 20 mg via INTRAVENOUS

## 2017-11-11 MED ORDER — FUROSEMIDE 10 MG/ML IJ SOLN
40.0000 mg | Freq: Once | INTRAMUSCULAR | Status: AC
  Administered 2017-11-11: 40 mg via INTRAVENOUS
  Filled 2017-11-11: qty 4

## 2017-11-11 MED ORDER — PANTOPRAZOLE SODIUM 40 MG PO TBEC
40.0000 mg | DELAYED_RELEASE_TABLET | Freq: Two times a day (BID) | ORAL | Status: DC
  Administered 2017-11-11 – 2017-11-12 (×3): 40 mg via ORAL
  Filled 2017-11-11 (×3): qty 1

## 2017-11-11 MED ORDER — SODIUM CHLORIDE 0.9 % IV SOLN
INTRAVENOUS | Status: DC | PRN
  Administered 2017-11-11: 12:00:00 via INTRAVENOUS

## 2017-11-11 MED ORDER — LIDOCAINE HCL (CARDIAC) PF 100 MG/5ML IV SOSY
PREFILLED_SYRINGE | INTRAVENOUS | Status: DC | PRN
  Administered 2017-11-11: 60 mg via INTRATRACHEAL

## 2017-11-11 MED ORDER — FUROSEMIDE 10 MG/ML IJ SOLN: 40.0000 mg | Freq: Every day | INTRAMUSCULAR | Status: DC

## 2017-11-11 NOTE — Anesthesia Postprocedure Evaluation (Signed)
Anesthesia Post Note  Patient: Andrew Guerra  Procedure(s) Performed: ESOPHAGOGASTRODUODENOSCOPY (EGD) (N/A ) BIOPSY     Patient location during evaluation: PACU Anesthesia Type: MAC Level of consciousness: awake and alert and oriented Pain management: pain level controlled Vital Signs Assessment: post-procedure vital signs reviewed and stable Respiratory status: spontaneous breathing, nonlabored ventilation, respiratory function stable and patient connected to nasal cannula oxygen Cardiovascular status: stable and blood pressure returned to baseline Postop Assessment: no apparent nausea or vomiting Anesthetic complications: no    Last Vitals:  Vitals:   11/11/17 1110 11/11/17 1201  BP: (!) 199/78 (!) 122/56  Pulse:  85  Resp: 20 (!) 24  Temp: 36.8 C 36.7 C  SpO2: 94% (!) 88%    Last Pain:  Vitals:   11/11/17 1201  TempSrc: Oral  PainSc: 0-No pain                 Tricia Oaxaca A.

## 2017-11-11 NOTE — Anesthesia Procedure Notes (Signed)
Procedure Name: MAC Date/Time: 11/11/2017 11:37 AM Performed by: Oletta Lamas, CRNA Pre-anesthesia Checklist: Patient identified, Emergency Drugs available, Suction available, Patient being monitored and Timeout performed Patient Re-evaluated:Patient Re-evaluated prior to induction

## 2017-11-11 NOTE — Interval H&P Note (Signed)
History and Physical Interval Note:  11/11/2017 11:35 AM  Andrew Guerra  has presented today for surgery, with the diagnosis of Dark stools, anemia.  The various methods of treatment have been discussed with the patient and family. After consideration of risks, benefits and other options for treatment, the patient has consented to  Procedure(s): ESOPHAGOGASTRODUODENOSCOPY (EGD) (N/A) as a surgical intervention .  The patient's history has been reviewed, patient examined, no change in status, stable for surgery.  I have reviewed the patient's chart and labs.  Questions were answered to the patient's satisfaction.     Dominic Pea Fabiha Rougeau

## 2017-11-11 NOTE — Anesthesia Preprocedure Evaluation (Addendum)
Anesthesia Evaluation  Patient identified by MRN, date of birth, ID band Patient awake    Reviewed: Allergy & Precautions, NPO status , Patient's Chart, lab work & pertinent test results, reviewed documented beta blocker date and time   Airway Mallampati: II  TM Distance: >3 FB Neck ROM: Full    Dental no notable dental hx. (+) Teeth Intact   Pulmonary former smoker,  Hx/o Lung Ca S/P RT   Pulmonary exam normal breath sounds clear to auscultation + decreased breath sounds      Cardiovascular hypertension, Pt. on medications and Pt. on home beta blockers +CHF  Normal cardiovascular exam+ dysrhythmias + Valvular Problems/Murmurs AS  Rhythm:Regular Rate:Normal  Hx/o Bicuspid AV Echo 10/24/2017-Left ventricle: The cavity size was normal. Wall thickness was increased in a pattern of moderate LVH. Systolic function was normal. The estimated ejection fraction was in the range of 60% to 65%. Wall motion was normal; there were no regional wall motion abnormalities. Doppler parameters are consistent with abnormal left ventricular relaxation (grade 1 diastolic dysfunction). - Aortic valve: Moderately calcified annulus. Moderately thickened, moderately calcified leaflets. 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 Valve area (VTI): 1.66 cm^2. Valve area (Vmax): 1.75 cm^2. Valve area  (Vmean): 1.58 cm^2. - Right atrium: The atrium was mildly dilated. - Pulmonary arteries: Systolic pressure was moderately increased. PA peak pressure: 56 mm Hg (S).  EKG 11/09/2017- NSR, RBBB + LPFB   Neuro/Psych negative neurological ROS  negative psych ROS   GI/Hepatic Neg liver ROS, GERD  Medicated and Controlled,Dark stools   Endo/Other  negative endocrine ROS  Renal/GU Renal InsufficiencyRenal diseaseHx/o renal tumor  negative genitourinary   Musculoskeletal negative musculoskeletal ROS (+)   Abdominal   Peds  Hematology  (+) anemia ,   Anesthesia Other  Findings   Reproductive/Obstetrics                           Anesthesia Physical Anesthesia Plan  ASA: III  Anesthesia Plan: MAC   Post-op Pain Management:    Induction: Intravenous  PONV Risk Score and Plan: 1 and Ondansetron, Treatment may vary due to age or medical condition and Propofol infusion  Airway Management Planned: Natural Airway and Nasal Cannula  Additional Equipment:   Intra-op Plan:   Post-operative Plan:   Informed Consent: I have reviewed the patients History and Physical, chart, labs and discussed the procedure including the risks, benefits and alternatives for the proposed anesthesia with the patient or authorized representative who has indicated his/her understanding and acceptance.   Dental advisory given  Plan Discussed with: CRNA and Surgeon  Anesthesia Plan Comments:         Anesthesia Quick Evaluation

## 2017-11-11 NOTE — Transfer of Care (Signed)
Immediate Anesthesia Transfer of Care Note  Patient: Andrew Guerra  Procedure(s) Performed: ESOPHAGOGASTRODUODENOSCOPY (EGD) (N/A ) BIOPSY  Patient Location: PACU and Endoscopy Unit  Anesthesia Type:MAC  Level of Consciousness: awake, alert , drowsy and patient cooperative  Airway & Oxygen Therapy: Patient Spontanous Breathing and Patient connected to nasal cannula oxygen  Post-op Assessment: Report given to RN and Post -op Vital signs reviewed and stable  Post vital signs: Reviewed and stable  Last Vitals:  Vitals Value Taken Time  BP 122/56 11/11/2017 12:01 PM  Temp 36.7 C 11/11/2017 12:01 PM  Pulse 80 11/11/2017 12:03 PM  Resp 21 11/11/2017 12:03 PM  SpO2 91 % 11/11/2017 12:03 PM  Vitals shown include unvalidated device data.  Last Pain:  Vitals:   11/11/17 1201  TempSrc: Oral  PainSc: 0-No pain         Complications: No apparent anesthesia complications

## 2017-11-11 NOTE — Progress Notes (Signed)
Progress Note    ATHONY COPPA  KDT:[id number] DOB: 1940-12-11  DOA: 11/09/2017 PCP: [name], MD    Brief Narrative:     Medical records reviewed and are as summarized below:  JULION GATT is an 77 y.o. male with medical history significant for chronic kidney disease stage III, hypertension, chronic diastolic CHF, and history of lung cancer status post resection and radiation, now presenting to the emergency department for evaluation of melena.  Patient reports that he has been experiencing increased shortness of breath with bilateral leg swelling for the past couple months, but he had otherwise been well until 11/07/2017 when he had a black tarry stool.  He has gone on to have melena since that time  Assessment/Plan:   Principal Problem:   Upper GI bleeding Active Problems:   Essential hypertension, benign   Renal malignant tumor (HCC)   CKD (chronic kidney disease), stage III (HCC)   Normocytic anemia   Chronic diastolic CHF (congestive heart failure) (HCC)   Melena   Gastritis and gastroduodenitis   Acute gastric ulcer without hemorrhage or perforation   Duodenal ulcer   Paraesophageal hernia   Upper GI bleeding; symptomatic anemia  - Presents with melena since 10/21 -Hgb <8 (12.3 in February)  -s/p 2 unit with good response S/p EGD:          - Normal esophagus.                           - Medium-sized paraesophageal hernia.                           - Non-bleeding gastric ulcers with no stigmata of                            bleeding. Biopsied.                           - Normal mucosa was found in the gastric fundus and                            in the gastric body.                           - Multiple non-bleeding duodenal ulcers with no                            stigmata of bleeding. Biopsied.                           - Nodule found in the duodenum. Query prominent                            minor papilla vs accessory duct. Biopsied.              - Use Protonix (pantoprazole) 40 mg PO BID for 8                            weeks.                           -  Perform magnetic resonance imaging (MRI) with                            gadolinium at appointment to be scheduled as an                            outpatient for further evaluation of the possible                            prominent minor papilla.                                                   - Consider repeat EGD in 6-8 weeks to evaluate for                            appropriate mucosal healing. May consider doing in                            conjunction with EUS for nodule.  Chronic diastolic CHF  - There is marked BLE edema that patient reports to be stable, he is slightly dyspneic with speech, but no rales detected or edema on CXR  - Echo done earlier this month with preserved EF, no WMA's, mild LVH, and grade 1 diastolic dysfunction  - IV lasix started- -monitor I/Os, daily weights  Bicuspid AV -follow outpatient with cardiology, recent echo  Hypokalemia -check BMP  Hypertension  - resume BP meds as able  CKD III   - SCr is 1.73 on admission -baseline around 1.4    History of lung cancer  - s/p resection and radiation in 2017 - outpatient oncology follow up     Family Communication/Anticipated D/C date and plan/Code Status   DVT prophylaxis: scd Code Status: Full Code.  Family Communication: at bedside Disposition Plan: pending diuresis   Medical Consultants:    GI     Subjective:   Still SOB-- needing O2  Objective:    Vitals:   11/11/17 1110 11/11/17 1201 11/11/17 1210 11/11/17 1254  BP: (!) 199/78 (!) 122/56 134/66 (!) 142/75  Pulse:  85 80 70  Resp: 20 (!) 24 20 (!) 22  Temp: 98.2 F (36.8 C) 98.1 F (36.7 C)  (!) 97.4 F (36.3 C)  TempSrc: Oral Oral  Oral  SpO2: 94% (!) 88% 91% (!) 83%  Weight:      Height:        Intake/Output Summary (Last 24 hours) at 11/11/2017 1430 Last data filed at 11/11/2017  1204 Gross per 24 hour  Intake 540.67 ml  Output 650 ml  Net -109.33 ml   Filed Weights   11/10/17 0410 11/10/17 1458 11/11/17 0521  Weight: 89.6 kg 91.3 kg 88.6 kg    Exam: In bed, 2L O2 [place] rrr Crackles as bases +LE edema   Data Reviewed:   I have personally reviewed following labs and imaging studies:  Labs: Labs show the following:   Basic Metabolic Panel: Recent Labs  Lab 11/08/17 0947  11/09/17 1234 11/09/17 1703 11/10/17 0124  NA  --   --  140 140 140  K  --    < >  3.4* 3.5 3.2*  CL  --   --  110 109 112*  CO2  --   --  23 24 21*  GLUCOSE  --   --  100* 85 94  BUN  --   --  34* 35* 32*  CREATININE 1.70*  --  1.62* 1.73* 1.57*  CALCIUM  --   --  8.2* 8.3* 7.9*   < > = values in this interval not displayed.   GFR Estimated Creatinine Clearance: 42.7 mL/min (A) (by C-G formula based on SCr of 1.57 mg/dL (H)). Liver Function Tests: Recent Labs  Lab 11/09/17 1234 11/09/17 1703  AST 33 34  ALT 23 26  ALKPHOS 83 83  BILITOT 0.5 0.5  PROT 5.6* 5.3*  ALBUMIN 2.5* 2.4*   No results for input(s): LIPASE, AMYLASE in the last 168 hours. No results for input(s): AMMONIA in the last 168 hours. Coagulation profile Recent Labs  Lab 11/09/17 1703  INR 1.09    CBC: Recent Labs  Lab 11/09/17 1234 11/09/17 1703 11/10/17 0124 11/10/17 0804 11/10/17 1837 11/11/17 0512  WBC 8.7 9.2  --   --   --   --   NEUTROABS 6.5  --   --   --   --   --   HGB 8.6* 8.2* 7.5* 7.4* 8.7* 9.3*  HCT 28.0* 27.5* 24.3* 23.4*  --  29.2*  MCV 81.9 82.3  --   --   --   --   PLT 301 351  --   --   --   --    Cardiac Enzymes: No results for input(s): CKTOTAL, CKMB, CKMBINDEX, TROPONINI in the last 168 hours. BNP (last 3 results) No results for input(s): PROBNP in the last 8760 hours. CBG: Recent Labs  Lab 11/10/17 0813 11/11/17 0803 11/11/17 1256  GLUCAP 87 89 84   D-Dimer: No results for input(s): DDIMER in the last 72 hours. Hgb A1c: No results for input(s):  HGBA1C in the last 72 hours. Lipid Profile: No results for input(s): CHOL, HDL, LDLCALC, TRIG, CHOLHDL, LDLDIRECT in the last 72 hours. Thyroid function studies: No results for input(s): TSH, T4TOTAL, T3FREE, THYROIDAB in the last 72 hours.  Invalid input(s): FREET3 Anemia work up: Recent Labs    11/09/17 1234  FERRITIN 24   Sepsis Labs: Recent Labs  Lab 11/09/17 1234 11/09/17 1703  WBC 8.7 9.2    Microbiology No results found for this or any previous visit (from the past 240 hour(s)).  Procedures and diagnostic studies:  Dg Chest 2 View  Result Date: 11/09/2017 CLINICAL DATA:  Black loose stools, short of breath EXAM: CHEST - 2 VIEW COMPARISON:  PET CT 11/25/2015, chest x-ray 10/30/2007, CT chest 11/11/2015 FINDINGS: Interval postsurgical changes of the right hilar region. No acute opacity or pleural effusion. Heart size within normal limits. No pneumothorax. IMPRESSION: No active cardiopulmonary disease. Interval postsurgical changes in the right perihilar region and right middle lobe. Electronically Signed   By: Donavan Foil M.D.   On: 11/09/2017 17:41    Medications:   . brimonidine  1 drop Both Eyes TID  . brinzolamide  1 drop Both Eyes TID  . feeding supplement (ENSURE ENLIVE)  237 mL Oral BID BM  . furosemide  40 mg Intravenous Once  . Lifitegrast  1 drop Both Eyes BID  . metoprolol succinate  25 mg Oral Daily  . mometasone-formoterol  2 puff Inhalation BID  . Netarsudil-Latanoprost  1 drop Both Eyes QHS  . pantoprazole  40 mg Oral BID  . sodium chloride flush  3 mL Intravenous Q12H  . timolol  1 drop Both Eyes BID   Continuous Infusions:   LOS: 0 days   Geradine Girt  [facility]   *Please refer to [email or web address], password [email or web address] to get updated schedule on who will round on this patient, as hospitalists switch teams weekly. If 7PM-7AM, please contact night-coverage at [email or web address], password [email or web address] for any overnight needs.  11/11/2017, 2:30 PM

## 2017-11-11 NOTE — Op Note (Signed)
[facility] Patient Name: Andrew Guerra Procedure Date : 11/11/2017 MRN: [id number] Attending MD: Gerrit Heck , MD Date of Birth: 16-Jan-1941 CSN: [id number] Age: 77 Admit Type: Inpatient Procedure:                Upper GI endoscopy Indications:              Acute post hemorrhagic anemia, Heme positive stool,                            Melena Providers:                Gerrit Heck, MD, Vista Lawman, RN, Alan Mulder, Technician Referring MD:              Medicines:                Monitored Anesthesia Care Complications:            No immediate complications. Estimated Blood Loss:     Estimated blood loss was minimal. Procedure:                Pre-Anesthesia Assessment:                           - Prior to the procedure, a History and Physical                            was performed, and patient medications and                            allergies were reviewed. The patient's tolerance of                            previous anesthesia was also reviewed. The risks                            and benefits of the procedure and the sedation                            options and risks were discussed with the patient.                            All questions were answered, and informed consent                            was obtained. Prior Anticoagulants: The patient has                            taken aspirin, last dose was 1 day prior to                            procedure. ASA Grade Assessment: III - A patient  with severe systemic disease. After reviewing the                            risks and benefits, the patient was deemed in                            satisfactory condition to undergo the procedure.                           After obtaining informed consent, the endoscope was                            passed under direct vision. Throughout the                            procedure, the patient's blood  pressure, pulse, and                            oxygen saturations were monitored continuously. The                            GIF-H190 ([id number]) Olympus adult EGD was introduced                            through the mouth, and advanced to the third part                            of duodenum. The upper GI endoscopy was                            accomplished without difficulty. The patient                            tolerated the procedure well. Scope In: Scope Out: Findings:      The examined esophagus was normal.      A medium-sized paraesophageal hernia was found. The hiatal narrowing was       45 cm from the incisors. The mucosa was otherwise normal appearing.      Few non-bleeding cratered gastric ulcers with no stigmata of bleeding       were found in the gastric antrum. The largest lesion was 3 mm in largest       dimension. Biopsies were taken with a cold forceps for Helicobacter       pylori testing. Estimated blood loss was minimal.      Normal mucosa was found in the gastric fundus and in the gastric body.      Few non-bleeding cratered duodenal ulcers with no stigmata of bleeding       were found in the duodenal bulb. The largest lesion was 5 mm in largest       dimension. Biopsies were taken with a cold forceps for histology.       Estimated blood loss was minimal.      A single medium nodule was found in the second portion of the duodenum.       This was located away from a bile stained ampulla.  The mucosa was       otherwise normal appearing. Biopsies were taken with a cold forceps for       histology. Estimated blood loss was minimal. Impression:               - Normal esophagus.                           - Medium-sized paraesophageal hernia.                           - Non-bleeding gastric ulcers with no stigmata of                            bleeding. Biopsied.                           - Normal mucosa was found in the gastric fundus and                             in the gastric body.                           - Multiple non-bleeding duodenal ulcers with no                            stigmata of bleeding. Biopsied.                           - Nodule found in the duodenum. Query prominent                            minor papilla vs accessory duct. Biopsied. Recommendation:           - Return patient to hospital ward for ongoing care.                           - Full liquid diet today.                           - Use Protonix (pantoprazole) 40 mg PO BID for 8                            weeks.                           - Perform magnetic resonance imaging (MRI) with                            gadolinium at appointment to be scheduled as an                            outpatient for further evaluation of the possible                            prominent minor papilla.                           -  Return to GI clinic at appointment to be                            scheduled.                           - Consider repeat EGD in 6-8 weeks to evaluate for                            appropriate mucosal healing. May consider doing in                            conjunction with EUS for nodule. Procedure Code(s):        --- Professional ---                           [phone], Esophagogastroduodenoscopy, flexible,                            transoral; with biopsy, single or multiple Diagnosis Code(s):        --- Professional ---                           K44.9, Diaphragmatic hernia without obstruction or                            gangrene                           K25.9, Gastric ulcer, unspecified as acute or                            chronic, without hemorrhage or perforation                           K26.9, Duodenal ulcer, unspecified as acute or                            chronic, without hemorrhage or perforation                           K31.89, Other diseases of stomach and duodenum                           D62, Acute posthemorrhagic anemia                            R19.5, Other fecal abnormalities                           K92.1, Melena (includes Hematochezia) CPT copyright 2018 [employer]. All rights reserved. The codes documented in this report are preliminary and upon coder review may  be revised to meet current compliance requirements. Gerrit Heck, MD 11/11/2017 12:11:02 PM Number of Addenda: 0

## 2017-11-11 NOTE — Plan of Care (Signed)

## 2017-11-12 DIAGNOSIS — K250 Acute gastric ulcer with hemorrhage: Secondary | ICD-10-CM

## 2017-11-12 DIAGNOSIS — Z923 Personal history of irradiation: Secondary | ICD-10-CM | POA: Diagnosis not present

## 2017-11-12 DIAGNOSIS — I130 Hypertensive heart and chronic kidney disease with heart failure and stage 1 through stage 4 chronic kidney disease, or unspecified chronic kidney disease: Secondary | ICD-10-CM | POA: Diagnosis not present

## 2017-11-12 DIAGNOSIS — K2991 Gastroduodenitis, unspecified, with bleeding: Secondary | ICD-10-CM | POA: Diagnosis present

## 2017-11-12 DIAGNOSIS — J9601 Acute respiratory failure with hypoxia: Secondary | ICD-10-CM

## 2017-11-12 DIAGNOSIS — K922 Gastrointestinal hemorrhage, unspecified: Secondary | ICD-10-CM | POA: Diagnosis not present

## 2017-11-12 DIAGNOSIS — C649 Malignant neoplasm of unspecified kidney, except renal pelvis: Secondary | ICD-10-CM | POA: Diagnosis not present

## 2017-11-12 DIAGNOSIS — N179 Acute kidney failure, unspecified: Secondary | ICD-10-CM | POA: Diagnosis not present

## 2017-11-12 DIAGNOSIS — Z87891 Personal history of nicotine dependence: Secondary | ICD-10-CM | POA: Diagnosis not present

## 2017-11-12 DIAGNOSIS — E876 Hypokalemia: Secondary | ICD-10-CM | POA: Diagnosis present

## 2017-11-12 DIAGNOSIS — K921 Melena: Secondary | ICD-10-CM | POA: Diagnosis present

## 2017-11-12 DIAGNOSIS — H409 Unspecified glaucoma: Secondary | ICD-10-CM | POA: Diagnosis not present

## 2017-11-12 DIAGNOSIS — D62 Acute posthemorrhagic anemia: Secondary | ICD-10-CM | POA: Diagnosis not present

## 2017-11-12 DIAGNOSIS — D573 Sickle-cell trait: Secondary | ICD-10-CM | POA: Diagnosis not present

## 2017-11-12 DIAGNOSIS — I350 Nonrheumatic aortic (valve) stenosis: Secondary | ICD-10-CM | POA: Diagnosis present

## 2017-11-12 DIAGNOSIS — I10 Essential (primary) hypertension: Secondary | ICD-10-CM | POA: Diagnosis not present

## 2017-11-12 DIAGNOSIS — K254 Chronic or unspecified gastric ulcer with hemorrhage: Secondary | ICD-10-CM | POA: Diagnosis not present

## 2017-11-12 DIAGNOSIS — K264 Chronic or unspecified duodenal ulcer with hemorrhage: Secondary | ICD-10-CM | POA: Diagnosis not present

## 2017-11-12 DIAGNOSIS — K260 Acute duodenal ulcer with hemorrhage: Secondary | ICD-10-CM | POA: Diagnosis not present

## 2017-11-12 DIAGNOSIS — K449 Diaphragmatic hernia without obstruction or gangrene: Secondary | ICD-10-CM | POA: Diagnosis present

## 2017-11-12 DIAGNOSIS — K2971 Gastritis, unspecified, with bleeding: Secondary | ICD-10-CM | POA: Diagnosis present

## 2017-11-12 DIAGNOSIS — N183 Chronic kidney disease, stage 3 (moderate): Secondary | ICD-10-CM | POA: Diagnosis present

## 2017-11-12 DIAGNOSIS — I5033 Acute on chronic diastolic (congestive) heart failure: Secondary | ICD-10-CM | POA: Diagnosis not present

## 2017-11-12 DIAGNOSIS — E785 Hyperlipidemia, unspecified: Secondary | ICD-10-CM | POA: Diagnosis not present

## 2017-11-12 DIAGNOSIS — Z85118 Personal history of other malignant neoplasm of bronchus and lung: Secondary | ICD-10-CM | POA: Diagnosis not present

## 2017-11-12 DIAGNOSIS — Z7982 Long term (current) use of aspirin: Secondary | ICD-10-CM | POA: Diagnosis not present

## 2017-11-12 DIAGNOSIS — Z7951 Long term (current) use of inhaled steroids: Secondary | ICD-10-CM | POA: Diagnosis not present

## 2017-11-12 LAB — GLUCOSE, CAPILLARY
GLUCOSE-CAPILLARY: 87 mg/dL (ref 70–99)
GLUCOSE-CAPILLARY: 105 mg/dL — AB (ref 70–99)
Glucose-Capillary: 116 mg/dL — ABNORMAL HIGH (ref 70–99)

## 2017-11-12 LAB — BASIC METABOLIC PANEL
Anion gap: 9 (ref 5–15)
BUN: 30 mg/dL — ABNORMAL HIGH (ref 8–23)
CALCIUM: 8 mg/dL — AB (ref 8.9–10.3)
CO2: 25 mmol/L (ref 22–32)
Chloride: 109 mmol/L (ref 98–111)
Creatinine, Ser: 1.77 mg/dL — ABNORMAL HIGH (ref 0.61–1.24)
GFR calc Af Amer: 40 mL/min — ABNORMAL LOW (ref >60)
GFR, EST NON AFRICAN AMERICAN: 35 mL/min — AB (ref >60)
GLUCOSE: 84 mg/dL (ref 70–99)
Potassium: 3.5 mmol/L (ref 3.5–5.1)
SODIUM: 143 mmol/L (ref 135–145)

## 2017-11-12 LAB — CBC
HCT: 26.1 % — ABNORMAL LOW (ref 39.0–52.0)
Hemoglobin: 8.4 g/dL — ABNORMAL LOW (ref 13.0–17.0)
MCH: 24.9 pg — ABNORMAL LOW (ref 26.0–34.0)
MCHC: 32.2 g/dL (ref 30.0–36.0)
MCV: 77.4 fL — ABNORMAL LOW (ref 80.0–100.0)
PLATELETS: 221 10*3/uL (ref 150–400)
RBC: 3.37 MIL/uL — AB (ref 4.22–5.81)
RDW: 16.3 % — AB (ref 11.5–15.5)
WBC: 8.5 10*3/uL (ref 4.0–10.5)
nRBC: 0 % (ref 0.0–0.2)

## 2017-11-12 LAB — HEMOGLOBIN AND HEMATOCRIT, BLOOD
HCT: 28.7 % — ABNORMAL LOW (ref 39.0–52.0)
Hemoglobin: 9.1 g/dL — ABNORMAL LOW (ref 13.0–17.0)

## 2017-11-12 MED ORDER — PANTOPRAZOLE SODIUM 40 MG PO TBEC
40.0000 mg | DELAYED_RELEASE_TABLET | Freq: Every day | ORAL | Status: DC
  Administered 2017-11-13: 40 mg via ORAL
  Filled 2017-11-12: qty 1

## 2017-11-12 MED ORDER — FUROSEMIDE 10 MG/ML IJ SOLN
40.0000 mg | Freq: Two times a day (BID) | INTRAMUSCULAR | Status: DC
  Administered 2017-11-12 – 2017-11-13 (×3): 40 mg via INTRAVENOUS
  Filled 2017-11-12 (×3): qty 4

## 2017-11-12 NOTE — Progress Notes (Addendum)
   Patient Name: Andrew Guerra Date of Encounter: 11/12/2017, 2:45 PM    Subjective  Feels ok No signs of bleeding Getting more diuretics and oxygen need evaluation  Objective  BP 128/66   Pulse 73   Temp 98.9 F (37.2 C) (Oral)   Resp 18   Ht 5\' 10"  (1.778 m)   Wt 88.7 kg   SpO2 91%   BMI 28.07 kg/m  NAD Anicteric Legs w/ 2+ edema pre-tibial  CBC Latest Ref Rng & Units 11/12/2017 11/11/2017 11/11/2017  WBC 4.0 - 10.5 K/uL 8.5 - -  Hemoglobin 13.0 - 17.0 g/dL 8.4(L) 10.0(L) 9.3(L)  Hematocrit 39.0 - 52.0 % 26.1(L) - 29.2(L)  Platelets 150 - 400 K/uL 221 - -       Assessment and Plan  Upper GI bleed from gastric and duodenal ulcers (small) Duodenal nodule - biopsied Acute blood loss anemia - Hgb variable somewhat  Continue PPI (daily ok at dc - was not on a PPI at admission) We will arrange f/u as outpatient 2- 4 weeks w/ Dr. Henrene Pastor (primary GI) or an APP He gets reg MR abd from Dr. Jeffie Pollock and nothing funny oin pancreas area - not sure he will need an MRCP or EUS but can sort out later  Signing off for now - avoid anticaogulants for now, daily PPI, we will f/u path and set an appointment (we will contact him unless he is still in house)    Gatha Mayer, MD, [facility] 11/12/2017 2:45 PM Pager [phone]

## 2017-11-12 NOTE — Progress Notes (Signed)
SATURATION QUALIFICATIONS: (This note is used to comply with regulatory documentation for home oxygen)  Patient Saturations on Room Air at Rest = 91%  Patient Saturations on Room Air while Ambulating = 86-90%  Patient Saturations on  Liters of oxygen while Ambulating = 91%  Please briefly explain why patient needs home oxygen: As needed 02 sat fluctuates

## 2017-11-12 NOTE — Plan of Care (Signed)

## 2017-11-12 NOTE — Progress Notes (Signed)
Tried to wean patient off of oxygen. Was able to get him from 3L to 1L stating at 89%-93%.

## 2017-11-12 NOTE — Plan of Care (Signed)
  Problem: Skin Integrity: Goal: Risk for impaired skin integrity will decrease Outcome: Progressing   Problem: Education: Goal: Ability to demonstrate management of disease process will improve Outcome: Progressing Goal: Ability to verbalize understanding of medication therapies will improve Outcome: Progressing Goal: Individualized Educational Video(s) Outcome: Progressing   Problem: Education: Goal: Ability to verbalize understanding of medication therapies will improve Outcome: Progressing   Problem: Education: Goal: Individualized Educational Video(s) Outcome: Progressing   Problem: Activity: Goal: Capacity to carry out activities will improve Outcome: Progressing Patient up to chair with no distress   Problem: Cardiac: Goal: Ability to achieve and maintain adequate cardiopulmonary perfusion will improve Outcome: Progressing

## 2017-11-12 NOTE — Progress Notes (Addendum)
Progress Note    Andrew Guerra  IBB:[id number] DOB: 12/15/1940  DOA: 11/09/2017 PCP: [name], MD    Brief Narrative:     Medical records reviewed and are as summarized below:  Andrew Guerra is an 77 y.o. male with medical history significant for chronic kidney disease stage III, hypertension, chronic diastolic CHF, and history of lung cancer status post resection and radiation, now presenting to the emergency department for evaluation of melena.  Patient reports that he has been experiencing increased shortness of breath with bilateral leg swelling for the past couple months, but he had otherwise been well until 11/07/2017 when he had a black tarry stool.  He has gone on to have melena since that time  Assessment/Plan:   Principal Problem:   Upper GI bleeding Active Problems:   Essential hypertension, benign   Renal malignant tumor (HCC)   CKD (chronic kidney disease), stage III (HCC)   Normocytic anemia   Chronic diastolic CHF (congestive heart failure) (HCC)   Melena   Gastritis and gastroduodenitis   Acute gastric ulcer without hemorrhage or perforation   Duodenal ulcer   Paraesophageal hernia   Upper GI bleeding; symptomatic anemia  - Presents with melena since 10/21 -Hgb <8 (12.3 in February)  -s/p 2 units PRBC -HGb was 10 and now trending down to 8.4-- ? Some volume overload but he did have a dark stool this AM, repeat H/H this PM to monitor -GI consult appreciated S/p EGD:          - Normal esophagus.                           - Medium-sized paraesophageal hernia.                           - Non-bleeding gastric ulcers with no stigmata of                            bleeding. Biopsied.                           - Normal mucosa was found in the gastric fundus and                            in the gastric body.                           - Multiple non-bleeding duodenal ulcers with no                            stigmata of bleeding. Biopsied.               - Nodule found in the duodenum. Query prominent                            minor papilla vs accessory duct. Biopsied.                           - Use Protonix (pantoprazole) 40 mg PO BID for 8  weeks.                           - Perform magnetic resonance imaging (MRI) with                            gadolinium at appointment to be scheduled as an                            outpatient for further evaluation of the possible                            prominent minor papilla.                                                   - Consider repeat EGD in 6-8 weeks to evaluate for                            appropriate mucosal healing. May consider doing in                            conjunction with EUS for nodule.  Acute on Chronic diastolic CHF  - has diuresed some with IV lasix -continues to need more diuretics-- IV lasix 40 mg BID  Acute respiratory failure -SOB initially thought to be due to anemia but appears more likely related to diastolic CHF -O2 sats <33% --wean O2 as tolerated with continued diuresis with IV lasix -home O2 study-- does not wear O2 at home  Bicuspid AV -follow outpatient with cardiology, recent echo  Hypokalemia -repleted  Hypertension  - resume BP meds as able  CKD III   - monitor closely with diuresis -baseline around 1.4    History of lung cancer  - s/p resection and radiation in 2017 - outpatient oncology follow up     Family Communication/Anticipated D/C date and plan/Code Status   DVT prophylaxis: scd Code Status: Full Code.  Family Communication: at bedside Disposition Plan: pending diuresis and weaning of O2   Medical Consultants:    GI     Subjective:   Able to lay a little flatter but still SOB with movement/activity/talking  Objective:    Vitals:   11/12/17 0554 11/12/17 0555 11/12/17 0556 11/12/17 0600  BP:      Pulse:  75    Resp:      Temp:      TempSrc:      SpO2: 95%  93% 91% (!) 85%  Weight:      Height:        Intake/Output Summary (Last 24 hours) at 11/12/2017 1054 Last data filed at 11/12/2017 0500 Gross per 24 hour  Intake 683 ml  Output 300 ml  Net 383 ml   Filed Weights   11/10/17 1458 11/11/17 0521 11/12/17 0542  Weight: 91.3 kg 88.6 kg 88.7 kg    Exam: On side of bed- still with 1L O2 Improved aeration and decreased amount of crackles at bases +LE edema to knees +BS, soft Rrr, + murmur   Data Reviewed:   I have personally reviewed following labs and  imaging studies:  Labs: Labs show the following:   Basic Metabolic Panel: Recent Labs  Lab 11/09/17 1234 11/09/17 1703 11/10/17 0124 11/11/17 1642 11/12/17 0547  NA 140 140 140 142 143  K 3.4* 3.5 3.2* 3.3* 3.5  CL 110 109 112* 111 109  CO2 23 24 21* 25 25  GLUCOSE 100* 85 94 144* 84  BUN 34* 35* 32* 29* 30*  CREATININE 1.62* 1.73* 1.57* 1.82* 1.77*  CALCIUM 8.2* 8.3* 7.9* 8.2* 8.0*   GFR Estimated Creatinine Clearance: 38 mL/min (A) (by C-G formula based on SCr of 1.77 mg/dL (H)). Liver Function Tests: Recent Labs  Lab 11/09/17 1234 11/09/17 1703  AST 33 34  ALT 23 26  ALKPHOS 83 83  BILITOT 0.5 0.5  PROT 5.6* 5.3*  ALBUMIN 2.5* 2.4*   No results for input(s): LIPASE, AMYLASE in the last 168 hours. No results for input(s): AMMONIA in the last 168 hours. Coagulation profile Recent Labs  Lab 11/09/17 1703  INR 1.09    CBC: Recent Labs  Lab 11/09/17 1234 11/09/17 1703 11/10/17 0124 11/10/17 0804 11/10/17 1837 11/11/17 0512 11/11/17 1642 11/12/17 0547  WBC 8.7 9.2  --   --   --   --   --  8.5  NEUTROABS 6.5  --   --   --   --   --   --   --   HGB 8.6* 8.2* 7.5* 7.4* 8.7* 9.3* 10.0* 8.4*  HCT 28.0* 27.5* 24.3* 23.4*  --  29.2*  --  26.1*  MCV 81.9 82.3  --   --   --   --   --  77.4*  PLT 301 351  --   --   --   --   --  221   Cardiac Enzymes: No results for input(s): CKTOTAL, CKMB, CKMBINDEX, TROPONINI in the last 168 hours. BNP (last 3  results) No results for input(s): PROBNP in the last 8760 hours. CBG: Recent Labs  Lab 11/10/17 0813 11/11/17 0803 11/11/17 1256 11/12/17 0813  GLUCAP 87 89 84 87   D-Dimer: No results for input(s): DDIMER in the last 72 hours. Hgb A1c: No results for input(s): HGBA1C in the last 72 hours. Lipid Profile: No results for input(s): CHOL, HDL, LDLCALC, TRIG, CHOLHDL, LDLDIRECT in the last 72 hours. Thyroid function studies: No results for input(s): TSH, T4TOTAL, T3FREE, THYROIDAB in the last 72 hours.  Invalid input(s): FREET3 Anemia work up: Recent Labs    11/09/17 1234  FERRITIN 24   Sepsis Labs: Recent Labs  Lab 11/09/17 1234 11/09/17 1703 11/12/17 0547  WBC 8.7 9.2 8.5    Microbiology No results found for this or any previous visit (from the past 240 hour(s)).  Procedures and diagnostic studies:  No results found.  Medications:   . brimonidine  1 drop Both Eyes TID  . brinzolamide  1 drop Both Eyes TID  . feeding supplement (ENSURE ENLIVE)  237 mL Oral BID BM  . furosemide  40 mg Intravenous BID  . Lifitegrast  1 drop Both Eyes BID  . metoprolol succinate  25 mg Oral Daily  . mometasone-formoterol  2 puff Inhalation BID  . Netarsudil-Latanoprost  1 drop Both Eyes QHS  . pantoprazole  40 mg Oral BID  . sodium chloride flush  3 mL Intravenous Q12H  . timolol  1 drop Both Eyes BID   Continuous Infusions:   LOS: 0 days   Geradine Girt  [facility]   *Please refer to  [email or web address], password [email or web address] to get updated schedule on who will round on this patient, as hospitalists switch teams weekly. If 7PM-7AM, please contact night-coverage at [email or web address], password [email or web address] for any overnight needs.  11/12/2017, 10:54 AM

## 2017-11-13 LAB — CBC
HCT: 30.5 % — ABNORMAL LOW (ref 39.0–52.0)
Hemoglobin: 9.4 g/dL — ABNORMAL LOW (ref 13.0–17.0)
MCH: 24.5 pg — AB (ref 26.0–34.0)
MCHC: 30.8 g/dL (ref 30.0–36.0)
MCV: 79.4 fL — AB (ref 80.0–100.0)
PLATELETS: 303 10*3/uL (ref 150–400)
RBC: 3.84 MIL/uL — AB (ref 4.22–5.81)
RDW: 16.3 % — ABNORMAL HIGH (ref 11.5–15.5)
WBC: 8.9 10*3/uL (ref 4.0–10.5)
nRBC: 0 % (ref 0.0–0.2)

## 2017-11-13 LAB — GLUCOSE, CAPILLARY: Glucose-Capillary: 78 mg/dL (ref 70–99)

## 2017-11-13 LAB — BASIC METABOLIC PANEL
Anion gap: 11 (ref 5–15)
BUN: 34 mg/dL — AB (ref 8–23)
CHLORIDE: 103 mmol/L (ref 98–111)
CO2: 26 mmol/L (ref 22–32)
Calcium: 8.2 mg/dL — ABNORMAL LOW (ref 8.9–10.3)
Creatinine, Ser: 1.79 mg/dL — ABNORMAL HIGH (ref 0.61–1.24)
GFR calc Af Amer: 40 mL/min — ABNORMAL LOW (ref >60)
GFR, EST NON AFRICAN AMERICAN: 34 mL/min — AB (ref >60)
GLUCOSE: 87 mg/dL (ref 70–99)
POTASSIUM: 3.4 mmol/L — AB (ref 3.5–5.1)
Sodium: 140 mmol/L (ref 135–145)

## 2017-11-13 MED ORDER — FUROSEMIDE 20 MG PO TABS: 40.0000 mg | ORAL_TABLET | Freq: Every day | ORAL | 0 refills | Status: DC

## 2017-11-13 MED ORDER — PANTOPRAZOLE SODIUM 40 MG PO TBEC: 40.0000 mg | DELAYED_RELEASE_TABLET | Freq: Every day | ORAL | 0 refills | Status: DC

## 2017-11-13 NOTE — Progress Notes (Signed)
Living better with Heart failure packet given to patient with education and teach back. Medication education given to patient and family member. Patient's questions and concerns were answered.  Patient's IV's were removed clean and dry upon removal. Patient denies chest pain or shortness of breath. Patient refused home oxygen MD notified. CCMD called. Patient transported in wheelchair accompanied by family member.     11/13/17 0918  Vitals  BP (!) 142/70  MAP (mmHg) 92  BP Method Automatic  Pulse Rate 73  Pulse Rate Source Monitor  Oxygen Therapy  SpO2 91 %

## 2017-11-13 NOTE — Discharge Summary (Signed)
Physician Discharge Summary  Andrew Guerra UKG:[id number] DOB: 1941/01/09 DOA: 11/09/2017  PCP: Andrew Drown, MD  Admit date: 11/09/2017 Discharge date: 11/13/2017  Admitted From: Home Disposition: Home  Recommendations for Outpatient Follow-up:  1. Follow up with PCP in 1-2 weeks 2. Please obtain BMP/CBC in one week your next doctors visit.  3. Lasix 40 mg daily instead of as needed 4. Protonix 40 mg daily before meals 5. Follow-up outpatient with [facility] gastroenterology.  Their service will follow up path and set an appointment outpatient  Home Health: None Equipment/Devices: None Discharge Condition: Stable CODE STATUS: Full code Diet recommendation: 2 g salt  Brief/Interim Summary: 77 year old with history of CKD stage III, diastolic CHF, essential hypertension, lung cancer status post resection and radiation came to the ER with complaints of dark stools.  He also reported of increasing shortness of breath, orthopnea and bilateral lower extremity swelling which had progressed over the course of last 2 months.  He underwent endoscopy which showed normal esophagus, nonbleeding gastric ulcers and duodenal ulcers.  Biopsies were performed and plan is to repeat endoscopy in about 2 months with possible EUS/MRCP.  Advise daily PPI. Due to his shortness of breath especially with exertion and hypoxia he underwent diuresis which improved his symptoms.  He was ambulating on the day of discharge in the hallway without any issues with only slight desaturation and quick recovery.  He is been advised to change his Lasix from 20 mg as needed to 40 mg daily and follow-up with outpatient cardiology within 1-2 week.  At this time he is reached maximum benefit from an hospital stay and stable to be discharged with outpatient follow-up recommendations as stated above.  Discharge Diagnoses:  Principal Problem:   Upper GI bleeding Active Problems:   Essential hypertension, benign   Renal malignant  tumor (HCC)   CKD (chronic kidney disease), stage III (HCC)   Normocytic anemia   Chronic diastolic CHF (congestive heart failure) (HCC)   Melena   Gastritis and gastroduodenitis   Acute gastric ulcer without hemorrhage or perforation   Duodenal ulcer   Paraesophageal hernia   Acute respiratory failure with hypoxia (HCC)  Symptomatic anemia with upper GI bleed Nonbleeding gastric and duodenal ulcers - Underwent endoscopy which showed nonbleeding multiple duodenal and gastric ulcers, patient was not on PPI prior to this therefore was placed on Protonix 40 mg daily.  Biopsies were performed and GI to call the patient for follow up visit.  Hemoglobin remained stable at this time.  Patient is no longer bleeding.  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 -We will change his Lasix as needed to 40 mg daily and have him follow-up outpatient with Andrew Guerra in 1-2 weeks. -Resume home meds.  Essential hypertension -Continue home medications.  History of lung cancer status post resection and radiation in 2017 -Follow-up outpatient with oncology  Patient was on SCDs while he was here Full code Discharged today in stable condition.  Discharge Instructions   Allergies as of 11/13/2017      Reactions   Atorvastatin    Achy and tired      Medication List    TAKE these medications   amLODipine 2.5 MG tablet Commonly known as:  NORVASC Take 1 tablet (2.5 mg total) by mouth daily.   aspirin EC 81 MG tablet Take 81 mg by mouth daily.  budesonide-formoterol 80-4.5 MCG/ACT inhaler Commonly known as:  SYMBICORT Inhale 2 puffs into the lungs 2 (two) times daily.   furosemide 20 MG tablet Commonly known as:  LASIX Take 2 tablets (40 mg total) by mouth daily. What changed:    how much to take  how to take this  when to  take this  additional instructions   hydrALAZINE 25 MG tablet Commonly known as:  APRESOLINE Take 1 tablet (25 mg total) by mouth 3 (three) times daily.   metoprolol succinate 50 MG 24 hr tablet Commonly known as:  TOPROL-XL Take 1/2 tablet daily.Take with or immediately following a meal.   multivitamin tablet Take 1 tablet by mouth daily.   pantoprazole 40 MG tablet Commonly known as:  PROTONIX Take 1 tablet (40 mg total) by mouth daily before breakfast. What changed:  when to take this   ROCKLATAN 0.02-0.005 % Soln Generic drug:  Netarsudil-Latanoprost Place 1 drop into both eyes at bedtime.   SIMBRINZA 1-0.2 % Susp Generic drug:  Brinzolamide-Brimonidine Place 2-3 drops into both eyes 3 (three) times daily.   timolol 0.5 % ophthalmic solution Commonly known as:  BETIMOL Place 1 drop into both eyes 2 (two) times daily.      Follow-up Information    Luking, Elayne Snare, MD. Schedule an appointment as soon as possible for a visit in 1 week(s).   Specialty:  Family Medicine Contact information: [place] [address] [phone]        Andrew Margarita, MD. Schedule an appointment as soon as possible for a visit in 2 week(s).   Specialty:  Cardiology Contact information: [address] [place] [address] [phone]          Allergies  Allergen Reactions  . Atorvastatin     Achy and tired    You were cared for by a hospitalist during your hospital stay. If you have any questions about your discharge medications or the care you received while you were in the hospital after you are discharged, you can call the unit and asked to speak with the hospitalist on call if the hospitalist that took care of you is not available. Once you are discharged, your primary care physician will handle any further medical issues. Please note that no refills for any discharge medications will be authorized once you are discharged, as it is  imperative that you return to your primary care physician (or establish a relationship with a primary care physician if you do not have one) for your aftercare needs so that they can reassess your need for medications and monitor your lab values.  Consultations:  Gastroenterology   Procedures/Studies: Dg Chest 2 View  Result Date: 11/09/2017 CLINICAL DATA:  Black loose stools, short of breath EXAM: CHEST - 2 VIEW COMPARISON:  PET CT 11/25/2015, chest x-ray 10/30/2007, CT chest 11/11/2015 FINDINGS: Interval postsurgical changes of the right hilar region. No acute opacity or pleural effusion. Heart size within normal limits. No pneumothorax. IMPRESSION: No active cardiopulmonary disease. Interval postsurgical changes in the right perihilar region and right middle lobe. Electronically Signed   By: Donavan Foil M.D.   On: 11/09/2017 17:41      Subjective: No complaints, feeling better.  Is able to ambulate in the hallway at moderate pace with only slight desaturation to 87% with quick recovery to 95%.  He does not want to go home with any oxygen at home.  General = no fevers, chills, dizziness,  malaise, fatigue HEENT/EYES = negative for pain, redness, loss of vision, double vision, blurred vision, loss of hearing, sore throat, hoarseness, dysphagia Cardiovascular= negative for chest pain, palpitation, murmurs, lower extremity swelling Respiratory/lungs= negative for shortness of breath, cough, hemoptysis, wheezing, mucus production Gastrointestinal= negative for nausea, vomiting,, abdominal pain, melena, hematemesis Genitourinary= negative for Dysuria, Hematuria, Change in Urinary Frequency MSK = Negative for arthralgia, myalgias, Back Pain, Joint swelling  Neurology= Negative for headache, seizures, numbness, tingling  Psychiatry= Negative for anxiety, depression, suicidal and homocidal ideation Allergy/Immunology= Medication/Food allergy as listed  Skin= Negative for Rash, lesions,  ulcers, itching   Discharge Exam: Vitals:   11/13/17 0915 11/13/17 0918  BP: (!) 142/70 (!) 142/70  Pulse: 73 73  Resp:    Temp:    SpO2:  91%   Vitals:   11/13/17 0845 11/13/17 0855 11/13/17 0915 11/13/17 0918  BP:   (!) 142/70 (!) 142/70  Pulse: 80  73 73  Resp:      Temp:      TempSrc:      SpO2: (!) 87% 96%  91%  Weight:      Height:        General: Pt is alert, awake, not in acute distress Cardiovascular: RRR, S1/S2 +, no rubs, no gallops Respiratory: CTA bilaterally, no wheezing, no rhonchi Abdominal: Soft, NT, ND, bowel sounds + Extremities: 1+ bilateral lower extremity pitting edema- improved    The results of significant diagnostics from this hospitalization (including imaging, microbiology, ancillary and laboratory) are listed below for reference.     Microbiology: No results found for this or any previous visit (from the past 240 hour(s)).   Labs: BNP (last 3 results) Recent Labs    11/09/17 1233 11/09/17 1703  BNP 297.0* 440.3*   Basic Metabolic Panel: Recent Labs  Lab 11/09/17 1703 11/10/17 0124 11/11/17 1642 11/12/17 0547 11/13/17 0604  NA 140 140 142 143 140  K 3.5 3.2* 3.3* 3.5 3.4*  CL 109 112* 111 109 103  CO2 24 21* 25 25 26   GLUCOSE 85 94 144* 84 87  BUN 35* 32* 29* 30* 34*  CREATININE 1.73* 1.57* 1.82* 1.77* 1.79*  CALCIUM 8.3* 7.9* 8.2* 8.0* 8.2*   Liver Function Tests: Recent Labs  Lab 11/09/17 1234 11/09/17 1703  AST 33 34  ALT 23 26  ALKPHOS 83 83  BILITOT 0.5 0.5  PROT 5.6* 5.3*  ALBUMIN 2.5* 2.4*   No results for input(s): LIPASE, AMYLASE in the last 168 hours. No results for input(s): AMMONIA in the last 168 hours. CBC: Recent Labs  Lab 11/09/17 1234 11/09/17 1703  11/10/17 0804  11/11/17 0512 11/11/17 1642 11/12/17 0547 11/12/17 1523 11/13/17 0604  WBC 8.7 9.2  --   --   --   --   --  8.5  --  8.9  NEUTROABS 6.5  --   --   --   --   --   --   --   --   --   HGB 8.6* 8.2*   < > 7.4*   < > 9.3* 10.0*  8.4* 9.1* 9.4*  HCT 28.0* 27.5*   < > 23.4*  --  29.2*  --  26.1* 28.7* 30.5*  MCV 81.9 82.3  --   --   --   --   --  77.4*  --  79.4*  PLT 301 351  --   --   --   --   --  221  --  303   < > = values in this interval not displayed.   Cardiac Enzymes: No results for input(s): CKTOTAL, CKMB, CKMBINDEX, TROPONINI in the last 168 hours. BNP: Invalid input(s): POCBNP CBG: Recent Labs  Lab 11/11/17 1256 11/12/17 0813 11/12/17 1218 11/12/17 1635 11/13/17 0623  GLUCAP 84 87 116* 105* 78   D-Dimer No results for input(s): DDIMER in the last 72 hours. Hgb A1c No results for input(s): HGBA1C in the last 72 hours. Lipid Profile No results for input(s): CHOL, HDL, LDLCALC, TRIG, CHOLHDL, LDLDIRECT in the last 72 hours. Thyroid function studies No results for input(s): TSH, T4TOTAL, T3FREE, THYROIDAB in the last 72 hours.  Invalid input(s): FREET3 Anemia work up No results for input(s): VITAMINB12, FOLATE, FERRITIN, TIBC, IRON, RETICCTPCT in the last 72 hours. Urinalysis No results found for: COLORURINE, APPEARANCEUR, LABSPEC, [place], GLUCOSEU, HGBUR, BILIRUBINUR, KETONESUR, PROTEINUR, UROBILINOGEN, NITRITE, LEUKOCYTESUR Sepsis Labs Invalid input(s): PROCALCITONIN,  WBC,  LACTICIDVEN Microbiology No results found for this or any previous visit (from the past 240 hour(s)).   Time coordinating discharge:  I have spent 35 minutes face to face with the patient and on the ward discussing the patients care, assessment, plan and disposition with other care givers. >50% of the time was devoted counseling the patient about the risks and benefits of treatment/Discharge disposition and coordinating care.   SIGNED:   Damita Lack, MD  [facility] 11/13/2017, 11:36 AM Pager   If 7PM-7AM, please contact night-coverage [email or web address] Password [email or web address]

## 2017-11-13 NOTE — Progress Notes (Signed)
SATURATION QUALIFICATIONS: (This note is used to comply with regulatory documentation for home oxygen)  Patient Saturations on Room Air at Rest = 94%  Patient Saturations on Room Air while Ambulating = 92 then drop to 87 after after walking the %  Patient Saturations on 2 Liters of oxygen while Ambulating = 100%  Please briefly explain why patient needs home oxygen: Patient feels better compared to admission.  desats after a long moderate pace work

## 2017-11-14 ENCOUNTER — Encounter: Payer: Self-pay | Admitting: Family Medicine

## 2017-11-14 ENCOUNTER — Encounter (HOSPITAL_COMMUNITY): Payer: Self-pay | Admitting: Gastroenterology

## 2017-11-15 ENCOUNTER — Encounter: Payer: Self-pay | Admitting: Gastroenterology

## 2017-11-16 ENCOUNTER — Other Ambulatory Visit: Payer: Self-pay

## 2017-11-16 MED ORDER — DOXYCYCLINE HYCLATE 100 MG PO CAPS: 100.0000 mg | ORAL_CAPSULE | Freq: Two times a day (BID) | ORAL | 0 refills | Status: AC

## 2017-11-16 MED ORDER — BISMUTH SUBSALICYLATE 262 MG PO CHEW: 524.0000 mg | CHEWABLE_TABLET | Freq: Four times a day (QID) | ORAL | 0 refills | Status: AC

## 2017-11-16 MED ORDER — PANTOPRAZOLE SODIUM 40 MG PO TBEC: 40.0000 mg | DELAYED_RELEASE_TABLET | Freq: Two times a day (BID) | ORAL | 3 refills | Status: DC

## 2017-11-16 MED ORDER — METRONIDAZOLE 250 MG PO TABS: 250.0000 mg | ORAL_TABLET | Freq: Four times a day (QID) | ORAL | 0 refills | Status: AC

## 2017-11-18 ENCOUNTER — Ambulatory Visit: Payer: Medicare Other | Admitting: Family Medicine

## 2017-11-18 ENCOUNTER — Encounter: Payer: Self-pay | Admitting: Family Medicine

## 2017-11-18 VITALS — BP 132/84 | Ht 70.0 in | Wt 201.6 lb

## 2017-11-18 DIAGNOSIS — D509 Iron deficiency anemia, unspecified: Secondary | ICD-10-CM | POA: Diagnosis not present

## 2017-11-18 DIAGNOSIS — K250 Acute gastric ulcer with hemorrhage: Secondary | ICD-10-CM | POA: Diagnosis not present

## 2017-11-18 DIAGNOSIS — N289 Disorder of kidney and ureter, unspecified: Secondary | ICD-10-CM

## 2017-11-18 DIAGNOSIS — D500 Iron deficiency anemia secondary to blood loss (chronic): Secondary | ICD-10-CM

## 2017-11-18 LAB — POCT HEMOGLOBIN: Hemoglobin: 7.5 g/dL — AB (ref 9.5–13.5)

## 2017-11-18 NOTE — Progress Notes (Signed)
   Subjective:    Patient ID: Andrew Guerra, male    DOB: 1940-04-14, 77 y.o.   MRN: [id number]  HPI Pt here today for hospital follow up.  Pt was in hospital for GI bleeding.  Pt states he is doing much better.  We did message his gastroenterologist they went ahead and started him on medicine for H. pylori He denies any bleeding issues Relates he is tolerating the medicine well Breathing is doing better now Not a short of breath States overall energy level reasonably decent Still having swelling in the legs not as bad with the Lasix We explained to the patient about his hemoglobin elevated creatinine and H. pylori I encourage patient to get a flu shot he defers Review of Systems  Constitutional: Negative for activity change, appetite change and fatigue.  HENT: Negative for congestion and rhinorrhea.   Respiratory: Negative for cough and shortness of breath.   Cardiovascular: Positive for leg swelling. Negative for chest pain.  Gastrointestinal: Negative for abdominal pain, nausea and vomiting.  Neurological: Negative for dizziness and headaches.  Psychiatric/Behavioral: Negative for agitation and behavioral problems.       Objective:   Physical Exam  Constitutional: He appears well-nourished. No distress.  HENT:  Head: Normocephalic and atraumatic.  Eyes: Right eye exhibits no discharge. Left eye exhibits no discharge.  Neck: No tracheal deviation present.  Cardiovascular: Normal rate, regular rhythm and normal heart sounds.  No murmur heard. Pulmonary/Chest: Effort normal and breath sounds normal. No respiratory distress. He has no wheezes.  Musculoskeletal: He exhibits no edema.  Lymphadenopathy:    He has no cervical adenopathy.  Neurological: He is alert.  Skin: Skin is warm. No rash noted.  Psychiatric: His behavior is normal.  Vitals reviewed.  Pedal edema noted bilateral Patient has follow-up in December      Assessment & Plan:  Renal insufficiency elevated  creatinine continue current blood pressure medicines and diuretic repeat met 7  Anemia associated with bleeding ulcer repeat CBC healthy diet recommended  Low protein adequate protein intake recommended  Pedal edema continue Lasix probably related to his condition plus also low protein/low albumin

## 2017-11-19 LAB — CBC WITH DIFFERENTIAL/PLATELET
BASOS: 0 %
Basophils Absolute: 0 10*3/uL (ref 0.0–0.2)
EOS (ABSOLUTE): 0.1 10*3/uL (ref 0.0–0.4)
Eos: 2 %
Hematocrit: 31.2 % — ABNORMAL LOW (ref 37.5–51.0)
Hemoglobin: 9.6 g/dL — ABNORMAL LOW (ref 13.0–17.7)
IMMATURE GRANULOCYTES: 0 %
Immature Grans (Abs): 0 10*3/uL (ref 0.0–0.1)
Lymphocytes Absolute: 0.9 10*3/uL (ref 0.7–3.1)
Lymphs: 11 %
MCH: 24.4 pg — ABNORMAL LOW (ref 26.6–33.0)
MCHC: 30.8 g/dL — AB (ref 31.5–35.7)
MCV: 79 fL (ref 79–97)
MONOS ABS: 0.7 10*3/uL (ref 0.1–0.9)
Monocytes: 9 %
NEUTROS PCT: 78 %
Neutrophils Absolute: 5.8 10*3/uL (ref 1.4–7.0)
Platelets: 327 10*3/uL (ref 150–450)
RBC: 3.94 x10E6/uL — ABNORMAL LOW (ref 4.14–5.80)
RDW: 15.2 % (ref 12.3–15.4)
WBC: 7.5 10*3/uL (ref 3.4–10.8)

## 2017-11-19 LAB — BASIC METABOLIC PANEL
BUN / CREAT RATIO: 18 (ref 10–24)
BUN: 33 mg/dL — ABNORMAL HIGH (ref 8–27)
CALCIUM: 8.1 mg/dL — AB (ref 8.6–10.2)
CHLORIDE: 101 mmol/L (ref 96–106)
CO2: 23 mmol/L (ref 20–29)
Creatinine, Ser: 1.79 mg/dL — ABNORMAL HIGH (ref 0.76–1.27)
GFR, EST AFRICAN AMERICAN: 41 mL/min/{1.73_m2} — AB (ref >59)
GFR, EST NON AFRICAN AMERICAN: 35 mL/min/{1.73_m2} — AB (ref >59)
Glucose: 73 mg/dL (ref 65–99)
Potassium: 3.7 mmol/L (ref 3.5–5.2)
SODIUM: 141 mmol/L (ref 134–144)

## 2017-11-24 ENCOUNTER — Other Ambulatory Visit: Payer: Self-pay | Admitting: Family Medicine

## 2017-11-30 ENCOUNTER — Encounter: Payer: Self-pay | Admitting: Nurse Practitioner

## 2017-11-30 ENCOUNTER — Other Ambulatory Visit (INDEPENDENT_AMBULATORY_CARE_PROVIDER_SITE_OTHER): Payer: Medicare Other

## 2017-11-30 ENCOUNTER — Ambulatory Visit: Payer: Medicare Other | Admitting: Nurse Practitioner

## 2017-11-30 VITALS — BP 134/60 | HR 76 | Ht 70.5 in | Wt 200.0 lb

## 2017-11-30 DIAGNOSIS — K922 Gastrointestinal hemorrhage, unspecified: Secondary | ICD-10-CM | POA: Diagnosis not present

## 2017-11-30 DIAGNOSIS — K279 Peptic ulcer, site unspecified, unspecified as acute or chronic, without hemorrhage or perforation: Secondary | ICD-10-CM

## 2017-11-30 DIAGNOSIS — D649 Anemia, unspecified: Secondary | ICD-10-CM

## 2017-11-30 LAB — HEMOGLOBIN AND HEMATOCRIT, BLOOD

## 2017-11-30 MED ORDER — PANTOPRAZOLE SODIUM 40 MG PO TBEC: 40.0000 mg | DELAYED_RELEASE_TABLET | Freq: Two times a day (BID) | ORAL | 1 refills | Status: DC

## 2017-11-30 NOTE — Progress Notes (Signed)
Chief Complaint:   Hospital follow up   IMPRESSION and PLAN:    77 year old male recently hospitalized with upper GI bleed secondary to gastric and duodenal ulcers.  Path did show H. pylori organisms.  Patient will be completing quadruple therapy tomorrow.  Stools have been dark but not black and the only became dark after starting the Pepto-Bismol.  Patient feels okay -Repeat H&H today given the confusion over 11/18/2017 results -Continue twice daily PPI for now -Patient needs EGD to document ulcer healing.  He has a duodenal nodule that also needs evaluating with either MRI or EUS.  I think based on the hospital notes that we were leaning towards EUS and case was previously discussed with Dr.Mansouraty.  -I will talk with Dr. Rush Landmark in the next day or two.  If EUS best route then will schedule EGD to be done at the same time.  I will forward note to Dr. Henrene Pastor as well as he is patient's primary GI.  -I will notify patient about repeat H+H results.  -We will need to check for H. pylori eradication in the future.  I do not want to stop his PPI anytime soon since we will be repeating his EGD to check for ulcer healing.  HPI:     Patient is a 77 year old male known to Dr. Henrene Pastor, he has a history of colon polyps and is up-to-date on his colonoscopy with last one being in 2017.  Patient's most recent GI history includes upper GI bleed secondary to PUD.  Hospitalized late October with melena and anemia.  Inpatient EGD ([facility]) remarkable for a medium sized paraesophageal hernia, nonbleeding gastric ulcers without stigmata of bleeding, multiple nonbleeding duodenal ulcers without stigmata of bleeding, and a nodule in the duodenum (prominant minor papilla versus accessory duct). Gastric biopsies compatible with chronic active gastritis with intestinal metaplasia / H. pylori organisms seen.  Duodenal nodule biopsy showed benign duodenal mucosa. Hemoglobin fell from baseline of around 12 down  to 7.7.  He received a blood transfusion and by the time of discharge his hemoglobin was 9.4.    PCP rechecked hemoglobin on 11/18/2017 and surprisingly it was 7.5.  Immediate repeat 9 and it was 9.6.  Patient is here for follow-up.  He has had no further bleeding.  His stools have been dark since starting Pepto, certainly not black like they were when admitted to the hospital. He will complete quadruple therapy for H. pylori today.  He has had significant improvement in shortness of breath compared to when admitted to the hospital.  He has no chest pain or lightheadedness.  Review of systems:     No chest pain, no SOB, no fevers, no urinary sx   Past Medical History:  Diagnosis Date  . 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  [name] Cataract    right eye - surgery to remove  . Elevated PSA 11/29/2016   Patient is followed by [facility].  Most recent PSA near 11.  He has had atypia on biopsy.  November 2008  . Essential hypertension, benign 06/08/2012  . Full dentures   . Glaucoma   . Heart murmur    since childhood, never has caused any problems  . History of rheumatic fever 11/04/2015  . Hyperlipidemia 04/25/2013  . Hypertension   . Malignant neoplasm of left lung ([place]) 02/08/2017  . RBBB  07/12/2017  . Sickle cell trait ([place])    no problems per patient    Patient's surgical history, family medical history, social history, medications and allergies were all reviewed in Epic   Serum creatinine: 1.79 mg/dL (H) 11/18/17 1145 Estimated creatinine clearance: 38.2 mL/min (A)  Current Outpatient Medications  Medication Sig Dispense Refill  . amLODipine (NORVASC) 2.5 MG tablet Take 1 tablet (2.5 mg total) by mouth daily. 90 tablet 1  . aspirin EC 81 MG tablet Take 81 mg by mouth daily.    [name] bismuth subsalicylate (PEPTO-BISMOL) 262 MG chewable tablet Chew 2 tablets (524  mg total) by mouth 4 (four) times daily for 14 days. 112 tablet 0  . Brinzolamide-Brimonidine (SIMBRINZA) 1-0.2 % SUSP Place 2-3 drops into both eyes 3 (three) times daily.     . budesonide-formoterol (SYMBICORT) 80-4.5 MCG/ACT inhaler Inhale 2 puffs into the lungs 2 (two) times daily. 1 Inhaler 3  . doxycycline (VIBRAMYCIN) 100 MG capsule Take 1 capsule (100 mg total) by mouth 2 (two) times daily for 14 days. 28 capsule 0  . furosemide (LASIX) 20 MG tablet Take 2 tablets (40 mg total) by mouth daily. 60 tablet 0  . hydrALAZINE (APRESOLINE) 25 MG tablet TAKE 1 TABLET BY MOUTH THREE TIMES A DAY 90 tablet 3  . metoprolol succinate (TOPROL-XL) 50 MG 24 hr tablet Take 1/2 tablet daily.Take with or immediately following a meal. 45 tablet 0  . metroNIDAZOLE (FLAGYL) 250 MG tablet Take 1 tablet (250 mg total) by mouth 4 (four) times daily for 14 days. 56 tablet 0  . Multiple Vitamin (MULTIVITAMIN) tablet Take 1 tablet by mouth daily.    . Netarsudil-Latanoprost (ROCKLATAN) 0.02-0.005 % SOLN Place 1 drop into both eyes at bedtime.     . pantoprazole (PROTONIX) 40 MG tablet Take 1 tablet (40 mg total) by mouth 2 (two) times daily. 60 tablet 3  . timolol (BETIMOL) 0.5 % ophthalmic solution Place 1 drop into both eyes 2 (two) times daily.      No current facility-administered medications for this visit.     Physical Exam:     BP 134/60   Pulse 76   Ht 5' 10.5" (1.791 m)   Wt 200 lb (90.7 kg)   BMI 28.29 kg/m    GENERAL:  Pleasant male in NAD PSYCH: : Cooperative, normal affect EENT:  conjunctiva pink, mucous membranes moist, neck supple without masses CARDIAC:  RRR, +murmur heard, no peripheral edema PULM: Normal respiratory effort, lungs CTA bilaterally, no wheezing ABDOMEN:  Nondistended, soft, nontender. No obvious masses,  normal bowel sounds SKIN:  turgor, no lesions seen Musculoskeletal:  Normal muscle tone, normal strength NEURO: Alert and oriented x 3, no focal neurologic  deficits   Tye Savoy , NP 11/30/2017, 10:18 AM

## 2017-11-30 NOTE — Patient Instructions (Signed)
If you are age 79 or older, your body mass index should be between 23-30. Your Body mass index is 28.29 kg/m. If this is out of the aforementioned range listed, please consider follow up with your Primary Care Provider.  If you are age 67 or younger, your body mass index should be between 19-25. Your Body mass index is 28.29 kg/m. If this is out of the aformentioned range listed, please consider follow up with your Primary Care Provider.   Your provider has requested that you go to the basement level for lab work before leaving today. Press "B" on the elevator. The lab is located at the first door on the left as you exit the elevator.   Will call you about testing of small bowel nodule after talking to Dr. Bryan Lemma.  Will schedule your EGD once we talk with Dr. Bryan Lemma.  Thank you for choosing me and [facility].   Tye Savoy, NP

## 2017-12-01 ENCOUNTER — Telehealth: Payer: Self-pay

## 2017-12-01 ENCOUNTER — Encounter: Payer: Self-pay | Admitting: Nurse Practitioner

## 2017-12-01 ENCOUNTER — Other Ambulatory Visit: Payer: Self-pay

## 2017-12-01 ENCOUNTER — Encounter: Payer: Self-pay | Admitting: Family Medicine

## 2017-12-01 DIAGNOSIS — D649 Anemia, unspecified: Secondary | ICD-10-CM

## 2017-12-01 NOTE — Progress Notes (Signed)
Yes, Dr. Rush Landmark can perform concurrent EGD and EUS.  Thanks

## 2017-12-01 NOTE — Telephone Encounter (Signed)
Patient is scheduled for the first infusion on 12/07/17 at 12:00. Patient has been instructed to schedule the second infusion before he leaves the Short Stay at [facility] on 12/07/17.  Questions invited and answered.

## 2017-12-01 NOTE — Telephone Encounter (Signed)
-----   Message from Willia Craze, NP sent at 11/30/2017  3:46 PM EST ----- Andrew Guerra, will you let Andrew Guerra know that surprisingly is hgb is 7.8.  Will you arrange for [facility] for him.  It is one infusion followed by a second one about one week later. I do not want to start oral iron because it can confuse the picture since he was recently hospitalized with an upper GI bleed.  He should have a repeat H&H couple weeks after iron infusion.  Thank you

## 2017-12-01 NOTE — Telephone Encounter (Signed)
Thanks Beth 

## 2017-12-05 ENCOUNTER — Telehealth: Payer: Self-pay

## 2017-12-05 DIAGNOSIS — R948 Abnormal results of function studies of other organs and systems: Secondary | ICD-10-CM

## 2017-12-05 NOTE — Progress Notes (Signed)
Agree with assessment and plan. I do think, looking at the endoscopic pictures, that the patient will benefit from a MRI/MRCP first so that we can visualize if this nodule is in the region of the accessory pancreatic duct (minor) and to ensure he does not have pancreatic divisum. Based on the results of the MRI/MRCP, would plan on EUS to evaluate and ensure there is not a lesion within the nodule itself. EGD follow up is reasonable as well and can be done in conjunction with EUS after we obtain the MRI results. Nevin Bloodgood or [place], can you arrange the MRI/MRCP? Thanks.

## 2017-12-05 NOTE — Telephone Encounter (Signed)
Agree with assessment and plan. I do think, looking at the endoscopic pictures, that the patient will benefit from a MRI/MRCP first so that we can visualize if this nodule is in the region of the accessory pancreatic duct (minor) and to ensure he does not have pancreatic divisum. Based on the results of the MRI/MRCP, would plan on EUS to evaluate and ensure there is not a lesion within the nodule itself. EGD follow up is reasonable as well and can be done in conjunction with EUS after we obtain the MRI results. Nevin Bloodgood or [place], can you arrange the MRI/MRCP? Thanks.

## 2017-12-05 NOTE — Progress Notes (Signed)
Looks like Andrew Guerra has gotten the MRI scheduled for 11/22. Thanks

## 2017-12-05 NOTE — Telephone Encounter (Signed)
The patient has been notified of this information and all questions answered.  The pt has been advised of the information and verbalized understanding.      You have been scheduled for an MRI at [facility] on 12/09/17. Your appointment time is 4 pm . Please arrive 30 minutes prior to your appointment time for registration purposes. Please make certain not to have anything to eat or drink 6 hours prior to your test. In addition, if you have any metal in your body, have a pacemaker or defibrillator, please be sure to let your ordering physician know. This test typically takes 45 minutes to 1 hour to complete. Should you need to reschedule, please call [phone] to do so.

## 2017-12-05 NOTE — Telephone Encounter (Signed)
-----   Message from Irving Copas., MD sent at 12/05/2017  8:08 AM EST -----   ----- Message ----- From: Willia Craze, NP Sent: 12/01/2017  11:09 AM EST To: Irving Copas., MD

## 2017-12-07 ENCOUNTER — Ambulatory Visit (HOSPITAL_COMMUNITY)
Admission: RE | Admit: 2017-12-07 | Discharge: 2017-12-07 | Disposition: A | Discharge status: Home / Self Care | Payer: Medicare Other | Source: Ambulatory Visit | Attending: Gastroenterology | Admitting: Gastroenterology

## 2017-12-07 DIAGNOSIS — D649 Anemia, unspecified: Secondary | ICD-10-CM | POA: Insufficient documentation

## 2017-12-07 MED ORDER — SODIUM CHLORIDE 0.9 % IV SOLN
510.0000 mg | INTRAVENOUS | Status: DC
  Administered 2017-12-07: 510 mg via INTRAVENOUS
  Filled 2017-12-07: qty 17

## 2017-12-07 NOTE — Discharge Instructions (Signed)

## 2017-12-09 ENCOUNTER — Other Ambulatory Visit: Payer: Self-pay | Admitting: Gastroenterology

## 2017-12-09 ENCOUNTER — Ambulatory Visit (HOSPITAL_COMMUNITY)
Admission: RE | Admit: 2017-12-09 | Discharge: 2017-12-09 | Disposition: A | Discharge status: Home / Self Care | Payer: Medicare Other | Source: Ambulatory Visit | Attending: Gastroenterology | Admitting: Gastroenterology

## 2017-12-09 DIAGNOSIS — N2889 Other specified disorders of kidney and ureter: Secondary | ICD-10-CM | POA: Insufficient documentation

## 2017-12-09 DIAGNOSIS — R948 Abnormal results of function studies of other organs and systems: Secondary | ICD-10-CM

## 2017-12-09 MED ORDER — GADOBUTROL 1 MMOL/ML IV SOLN
10.0000 mL | Freq: Once | INTRAVENOUS | Status: AC | PRN
  Administered 2017-12-09: 8 mL via INTRAVENOUS

## 2017-12-14 ENCOUNTER — Other Ambulatory Visit: Payer: Self-pay

## 2017-12-14 ENCOUNTER — Ambulatory Visit (HOSPITAL_COMMUNITY)
Admission: RE | Admit: 2017-12-14 | Discharge: 2017-12-14 | Disposition: A | Discharge status: Home / Self Care | Payer: Medicare Other | Source: Ambulatory Visit | Attending: Gastroenterology | Admitting: Gastroenterology

## 2017-12-14 DIAGNOSIS — D649 Anemia, unspecified: Secondary | ICD-10-CM | POA: Insufficient documentation

## 2017-12-14 MED ORDER — SODIUM CHLORIDE 0.9 % IV SOLN
510.0000 mg | INTRAVENOUS | Status: DC
  Administered 2017-12-14: 510 mg via INTRAVENOUS
  Filled 2017-12-14: qty 17

## 2017-12-21 ENCOUNTER — Other Ambulatory Visit (INDEPENDENT_AMBULATORY_CARE_PROVIDER_SITE_OTHER): Payer: Medicare Other

## 2017-12-21 DIAGNOSIS — D649 Anemia, unspecified: Secondary | ICD-10-CM | POA: Diagnosis not present

## 2017-12-21 LAB — HEPATIC FUNCTION PANEL
ALBUMIN: 2.5 g/dL — AB (ref 3.5–5.2)
ALK PHOS: 79 U/L (ref 39–117)
ALT: 12 U/L (ref 0–53)
AST: 18 U/L (ref 0–37)
BILIRUBIN DIRECT: 0.1 mg/dL (ref 0.0–0.3)
BILIRUBIN TOTAL: 0.3 mg/dL (ref 0.2–1.2)
Total Protein: 5.2 g/dL — ABNORMAL LOW (ref 6.0–8.3)

## 2017-12-21 LAB — CBC WITH DIFFERENTIAL/PLATELET
BASOS PCT: 0.9 % (ref 0.0–3.0)
Basophils Absolute: 0.1 10*3/uL (ref 0.0–0.1)
EOS ABS: 0.1 10*3/uL (ref 0.0–0.7)
Eosinophils Relative: 1.2 % (ref 0.0–5.0)
HCT: 25.9 % — ABNORMAL LOW (ref 39.0–52.0)
Lymphocytes Relative: 11.6 % — ABNORMAL LOW (ref 12.0–46.0)
Lymphs Abs: 0.9 10*3/uL (ref 0.7–4.0)
MCHC: 32.4 g/dL (ref 30.0–36.0)
MCV: 81.9 fl (ref 78.0–100.0)
MONO ABS: 0.6 10*3/uL (ref 0.1–1.0)
Monocytes Relative: 8.3 % (ref 3.0–12.0)
Neutro Abs: 5.7 10*3/uL (ref 1.4–7.7)
Neutrophils Relative %: 78 % — ABNORMAL HIGH (ref 43.0–77.0)
Platelets: 262 10*3/uL (ref 150.0–400.0)
RBC: 3.17 Mil/uL — AB (ref 4.22–5.81)
RDW: 28.1 % — AB (ref 11.5–15.5)
WBC: 7.3 10*3/uL (ref 4.0–10.5)

## 2017-12-23 LAB — SPECIMEN STATUS REPORT

## 2017-12-26 LAB — ANEMIA PANEL
FERRITIN: 348 ng/mL (ref 30–400)
HEMATOCRIT: 27.3 % — AB (ref 37.5–51.0)
Iron Saturation: 12 % — ABNORMAL LOW (ref 15–55)
Iron: 29 ug/dL — ABNORMAL LOW (ref 38–169)
RETIC CT PCT: 3.7 % — AB (ref 0.6–2.6)
TIBC: 237 ug/dL — AB (ref 250–450)
UIBC: 208 ug/dL (ref 111–343)
VITAMIN B 12: 1412 pg/mL — AB (ref 232–1245)

## 2017-12-27 NOTE — Addendum Note (Signed)
Addended by: Virgina Evener A on: 12/27/2017 03:45 PM   Modules accepted: Orders

## 2017-12-28 ENCOUNTER — Other Ambulatory Visit (INDEPENDENT_AMBULATORY_CARE_PROVIDER_SITE_OTHER): Payer: Medicare Other

## 2017-12-28 DIAGNOSIS — D649 Anemia, unspecified: Secondary | ICD-10-CM | POA: Diagnosis not present

## 2017-12-28 LAB — CBC WITH DIFFERENTIAL/PLATELET
BASOS ABS: 0.1 10*3/uL (ref 0.0–0.1)
BASOS PCT: 0.8 % (ref 0.0–3.0)
EOS ABS: 0.1 10*3/uL (ref 0.0–0.7)
Eosinophils Relative: 0.8 % (ref 0.0–5.0)
HCT: 24.7 % — ABNORMAL LOW (ref 39.0–52.0)
Hemoglobin: 7.9 g/dL — CL (ref 13.0–17.0)
LYMPHS ABS: 0.8 10*3/uL (ref 0.7–4.0)
Lymphocytes Relative: 10.4 % — ABNORMAL LOW (ref 12.0–46.0)
MCHC: 32.1 g/dL (ref 30.0–36.0)
MCV: 80.5 fl (ref 78.0–100.0)
Monocytes Absolute: 0.7 10*3/uL (ref 0.1–1.0)
Monocytes Relative: 9.2 % (ref 3.0–12.0)
NEUTROS PCT: 78.8 % — AB (ref 43.0–77.0)
Neutro Abs: 6.4 10*3/uL (ref 1.4–7.7)
Platelets: 434 10*3/uL — ABNORMAL HIGH (ref 150.0–400.0)
RBC: 3.07 Mil/uL — ABNORMAL LOW (ref 4.22–5.81)
RDW: 25.3 % — AB (ref 11.5–15.5)
WBC: 8.1 10*3/uL (ref 4.0–10.5)

## 2017-12-29 ENCOUNTER — Other Ambulatory Visit: Payer: Self-pay

## 2017-12-29 DIAGNOSIS — D649 Anemia, unspecified: Secondary | ICD-10-CM

## 2017-12-31 ENCOUNTER — Encounter: Payer: Self-pay | Admitting: Family Medicine

## 2018-01-01 ENCOUNTER — Other Ambulatory Visit: Payer: Self-pay | Admitting: Family Medicine

## 2018-01-02 ENCOUNTER — Ambulatory Visit: Payer: Medicare Other | Admitting: Cardiology

## 2018-01-03 ENCOUNTER — Other Ambulatory Visit: Payer: Self-pay | Admitting: Family Medicine

## 2018-01-03 ENCOUNTER — Telehealth: Payer: Self-pay | Admitting: Family Medicine

## 2018-01-03 MED ORDER — KETOCONAZOLE 2 % EX CREA: TOPICAL_CREAM | CUTANEOUS | 2 refills | Status: DC

## 2018-01-03 NOTE — Telephone Encounter (Signed)
Please see my chart message  More than likely a yeast infection around the foreskin I recommend ketoconazole cream apply twice daily, 45 g tube, 2 refills  Please find out from the patient does he have furosemide at home to take for the swelling in the legs? Is the swelling getting worse?  Patient has follow-up office visit this coming Monday please keep appointment

## 2018-01-03 NOTE — Telephone Encounter (Signed)
Patient does have furosemide at home. Pt states his swelling is the same. Pt is taking his furosemide twice daily. [facility] [place] [facility]. Medication sent in. Pt aware to keep appointment on 01/09/18. Pt verbalized understanding.

## 2018-01-04 ENCOUNTER — Telehealth: Payer: Self-pay | Admitting: Family Medicine

## 2018-01-04 ENCOUNTER — Other Ambulatory Visit: Payer: Self-pay

## 2018-01-04 MED ORDER — FUROSEMIDE 20 MG PO TABS: ORAL_TABLET | ORAL | 6 refills | Status: DC

## 2018-01-04 NOTE — Telephone Encounter (Signed)
Lasix 20 mg 1 to 2 tablets every morning as needed to help with leg swelling #60, 6 refills

## 2018-01-04 NOTE — Telephone Encounter (Signed)
Contacted patient to inform him that med was sent in. Pt states that he is good on Lasix and doesn't need any refills of any med.

## 2018-01-04 NOTE — Telephone Encounter (Signed)
Pt needing refill for "fluid pill" [facility] stated they [facility]ed [facility] script x 2.    Pharmacy:  [id number] - [place], [place]

## 2018-01-04 NOTE — Telephone Encounter (Signed)
Lasix has been filled by provider before but filled by different provider earlier this year. Please advise. Thank you

## 2018-01-05 ENCOUNTER — Ambulatory Visit: Payer: Medicare Other | Admitting: Family Medicine

## 2018-01-05 NOTE — Telephone Encounter (Signed)
[name] at [employer] [facility] it has already been deactivated.

## 2018-01-05 NOTE — Telephone Encounter (Signed)
This patient is no longer on this medicine please notify pharmacy of this

## 2018-01-09 ENCOUNTER — Encounter (HOSPITAL_COMMUNITY): Payer: Self-pay | Admitting: *Deleted

## 2018-01-09 ENCOUNTER — Ambulatory Visit: Payer: Medicare Other | Admitting: Family Medicine

## 2018-01-09 ENCOUNTER — Encounter: Payer: Self-pay | Admitting: Family Medicine

## 2018-01-09 ENCOUNTER — Other Ambulatory Visit (HOSPITAL_COMMUNITY)
Admission: RE | Admit: 2018-01-09 | Discharge: 2018-01-09 | Disposition: A | Discharge status: Home / Self Care | Payer: Medicare Other | Source: Ambulatory Visit | Attending: Family Medicine | Admitting: Family Medicine

## 2018-01-09 ENCOUNTER — Other Ambulatory Visit: Payer: Self-pay

## 2018-01-09 ENCOUNTER — Emergency Department (HOSPITAL_COMMUNITY): Payer: Medicare Other

## 2018-01-09 ENCOUNTER — Inpatient Hospital Stay (HOSPITAL_COMMUNITY)
Admission: EM | Admit: 2018-01-09 | Discharge: 2018-02-09 | DRG: 377 | Disposition: A | Discharge status: Home Health | Payer: Medicare Other | Attending: Internal Medicine | Admitting: Internal Medicine

## 2018-01-09 VITALS — BP 136/84 | Wt 219.8 lb

## 2018-01-09 DIAGNOSIS — I509 Heart failure, unspecified: Secondary | ICD-10-CM | POA: Diagnosis not present

## 2018-01-09 DIAGNOSIS — K921 Melena: Secondary | ICD-10-CM | POA: Diagnosis not present

## 2018-01-09 DIAGNOSIS — D6489 Other specified anemias: Secondary | ICD-10-CM

## 2018-01-09 DIAGNOSIS — N184 Chronic kidney disease, stage 4 (severe): Secondary | ICD-10-CM | POA: Diagnosis present

## 2018-01-09 DIAGNOSIS — Z8249 Family history of ischemic heart disease and other diseases of the circulatory system: Secondary | ICD-10-CM

## 2018-01-09 DIAGNOSIS — R8271 Bacteriuria: Secondary | ICD-10-CM | POA: Diagnosis not present

## 2018-01-09 DIAGNOSIS — I4510 Unspecified right bundle-branch block: Secondary | ICD-10-CM | POA: Diagnosis present

## 2018-01-09 DIAGNOSIS — K254 Chronic or unspecified gastric ulcer with hemorrhage: Secondary | ICD-10-CM | POA: Diagnosis not present

## 2018-01-09 DIAGNOSIS — J9601 Acute respiratory failure with hypoxia: Secondary | ICD-10-CM | POA: Diagnosis not present

## 2018-01-09 DIAGNOSIS — K922 Gastrointestinal hemorrhage, unspecified: Secondary | ICD-10-CM

## 2018-01-09 DIAGNOSIS — Z79899 Other long term (current) drug therapy: Secondary | ICD-10-CM

## 2018-01-09 DIAGNOSIS — K2950 Unspecified chronic gastritis without bleeding: Secondary | ICD-10-CM | POA: Diagnosis present

## 2018-01-09 DIAGNOSIS — K267 Chronic duodenal ulcer without hemorrhage or perforation: Secondary | ICD-10-CM | POA: Diagnosis present

## 2018-01-09 DIAGNOSIS — N2889 Other specified disorders of kidney and ureter: Secondary | ICD-10-CM | POA: Diagnosis present

## 2018-01-09 DIAGNOSIS — R001 Bradycardia, unspecified: Secondary | ICD-10-CM | POA: Diagnosis not present

## 2018-01-09 DIAGNOSIS — K257 Chronic gastric ulcer without hemorrhage or perforation: Secondary | ICD-10-CM | POA: Diagnosis present

## 2018-01-09 DIAGNOSIS — Z9089 Acquired absence of other organs: Secondary | ICD-10-CM

## 2018-01-09 DIAGNOSIS — K210 Gastro-esophageal reflux disease with esophagitis: Secondary | ICD-10-CM | POA: Diagnosis present

## 2018-01-09 DIAGNOSIS — I5032 Chronic diastolic (congestive) heart failure: Secondary | ICD-10-CM

## 2018-01-09 DIAGNOSIS — Z923 Personal history of irradiation: Secondary | ICD-10-CM

## 2018-01-09 DIAGNOSIS — D509 Iron deficiency anemia, unspecified: Secondary | ICD-10-CM

## 2018-01-09 DIAGNOSIS — R601 Generalized edema: Secondary | ICD-10-CM | POA: Diagnosis not present

## 2018-01-09 DIAGNOSIS — R600 Localized edema: Secondary | ICD-10-CM | POA: Diagnosis present

## 2018-01-09 DIAGNOSIS — Z86010 Personal history of colonic polyps: Secondary | ICD-10-CM

## 2018-01-09 DIAGNOSIS — D500 Iron deficiency anemia secondary to blood loss (chronic): Secondary | ICD-10-CM | POA: Diagnosis not present

## 2018-01-09 DIAGNOSIS — Z833 Family history of diabetes mellitus: Secondary | ICD-10-CM

## 2018-01-09 DIAGNOSIS — Z9841 Cataract extraction status, right eye: Secondary | ICD-10-CM

## 2018-01-09 DIAGNOSIS — I313 Pericardial effusion (noninflammatory): Secondary | ICD-10-CM | POA: Diagnosis not present

## 2018-01-09 DIAGNOSIS — R0602 Shortness of breath: Secondary | ICD-10-CM

## 2018-01-09 DIAGNOSIS — Z87891 Personal history of nicotine dependence: Secondary | ICD-10-CM

## 2018-01-09 DIAGNOSIS — L8990 Pressure ulcer of unspecified site, unspecified stage: Secondary | ICD-10-CM

## 2018-01-09 DIAGNOSIS — C3492 Malignant neoplasm of unspecified part of left bronchus or lung: Secondary | ICD-10-CM | POA: Diagnosis present

## 2018-01-09 DIAGNOSIS — R05 Cough: Secondary | ICD-10-CM

## 2018-01-09 DIAGNOSIS — R0600 Dyspnea, unspecified: Secondary | ICD-10-CM

## 2018-01-09 DIAGNOSIS — N179 Acute kidney failure, unspecified: Secondary | ICD-10-CM

## 2018-01-09 DIAGNOSIS — Z801 Family history of malignant neoplasm of trachea, bronchus and lung: Secondary | ICD-10-CM

## 2018-01-09 DIAGNOSIS — F05 Delirium due to known physiological condition: Secondary | ICD-10-CM | POA: Diagnosis not present

## 2018-01-09 DIAGNOSIS — D7389 Other diseases of spleen: Secondary | ICD-10-CM | POA: Diagnosis present

## 2018-01-09 DIAGNOSIS — I5082 Biventricular heart failure: Secondary | ICD-10-CM | POA: Diagnosis present

## 2018-01-09 DIAGNOSIS — Z1612 Extended spectrum beta lactamase (ESBL) resistance: Secondary | ICD-10-CM | POA: Diagnosis not present

## 2018-01-09 DIAGNOSIS — K753 Granulomatous hepatitis, not elsewhere classified: Secondary | ICD-10-CM | POA: Diagnosis present

## 2018-01-09 DIAGNOSIS — I361 Nonrheumatic tricuspid (valve) insufficiency: Secondary | ICD-10-CM | POA: Diagnosis not present

## 2018-01-09 DIAGNOSIS — T501X5A Adverse effect of loop [high-ceiling] diuretics, initial encounter: Secondary | ICD-10-CM | POA: Diagnosis not present

## 2018-01-09 DIAGNOSIS — E041 Nontoxic single thyroid nodule: Secondary | ICD-10-CM | POA: Diagnosis present

## 2018-01-09 DIAGNOSIS — I130 Hypertensive heart and chronic kidney disease with heart failure and stage 1 through stage 4 chronic kidney disease, or unspecified chronic kidney disease: Secondary | ICD-10-CM | POA: Diagnosis not present

## 2018-01-09 DIAGNOSIS — K274 Chronic or unspecified peptic ulcer, site unspecified, with hemorrhage: Secondary | ICD-10-CM | POA: Diagnosis not present

## 2018-01-09 DIAGNOSIS — K2970 Gastritis, unspecified, without bleeding: Secondary | ICD-10-CM | POA: Diagnosis not present

## 2018-01-09 DIAGNOSIS — E669 Obesity, unspecified: Secondary | ICD-10-CM | POA: Diagnosis present

## 2018-01-09 DIAGNOSIS — Y92230 Patient room in hospital as the place of occurrence of the external cause: Secondary | ICD-10-CM | POA: Diagnosis not present

## 2018-01-09 DIAGNOSIS — I471 Supraventricular tachycardia: Secondary | ICD-10-CM | POA: Diagnosis not present

## 2018-01-09 DIAGNOSIS — I10 Essential (primary) hypertension: Secondary | ICD-10-CM | POA: Insufficient documentation

## 2018-01-09 DIAGNOSIS — Z7951 Long term (current) use of inhaled steroids: Secondary | ICD-10-CM

## 2018-01-09 DIAGNOSIS — H409 Unspecified glaucoma: Secondary | ICD-10-CM | POA: Diagnosis present

## 2018-01-09 DIAGNOSIS — R3911 Hesitancy of micturition: Secondary | ICD-10-CM | POA: Diagnosis present

## 2018-01-09 DIAGNOSIS — E440 Moderate protein-calorie malnutrition: Secondary | ICD-10-CM

## 2018-01-09 DIAGNOSIS — Q6102 Congenital multiple renal cysts: Secondary | ICD-10-CM | POA: Diagnosis not present

## 2018-01-09 DIAGNOSIS — Q231 Congenital insufficiency of aortic valve: Secondary | ICD-10-CM | POA: Diagnosis not present

## 2018-01-09 DIAGNOSIS — R718 Other abnormality of red blood cells: Secondary | ICD-10-CM | POA: Diagnosis present

## 2018-01-09 DIAGNOSIS — K264 Chronic or unspecified duodenal ulcer with hemorrhage: Secondary | ICD-10-CM | POA: Diagnosis not present

## 2018-01-09 DIAGNOSIS — I5033 Acute on chronic diastolic (congestive) heart failure: Secondary | ICD-10-CM | POA: Diagnosis present

## 2018-01-09 DIAGNOSIS — I2729 Other secondary pulmonary hypertension: Secondary | ICD-10-CM | POA: Diagnosis present

## 2018-01-09 DIAGNOSIS — R34 Anuria and oliguria: Secondary | ICD-10-CM | POA: Diagnosis present

## 2018-01-09 DIAGNOSIS — I480 Paroxysmal atrial fibrillation: Secondary | ICD-10-CM | POA: Diagnosis not present

## 2018-01-09 DIAGNOSIS — B9681 Helicobacter pylori [H. pylori] as the cause of diseases classified elsewhere: Secondary | ICD-10-CM | POA: Diagnosis present

## 2018-01-09 DIAGNOSIS — K3189 Other diseases of stomach and duodenum: Secondary | ICD-10-CM | POA: Diagnosis present

## 2018-01-09 DIAGNOSIS — N170 Acute kidney failure with tubular necrosis: Secondary | ICD-10-CM | POA: Diagnosis not present

## 2018-01-09 DIAGNOSIS — Z85118 Personal history of other malignant neoplasm of bronchus and lung: Secondary | ICD-10-CM

## 2018-01-09 DIAGNOSIS — N183 Chronic kidney disease, stage 3 unspecified: Secondary | ICD-10-CM

## 2018-01-09 DIAGNOSIS — E876 Hypokalemia: Secondary | ICD-10-CM | POA: Diagnosis not present

## 2018-01-09 DIAGNOSIS — Q230 Congenital stenosis of aortic valve: Secondary | ICD-10-CM

## 2018-01-09 DIAGNOSIS — R3912 Poor urinary stream: Secondary | ICD-10-CM | POA: Diagnosis present

## 2018-01-09 DIAGNOSIS — D573 Sickle-cell trait: Secondary | ICD-10-CM | POA: Diagnosis present

## 2018-01-09 DIAGNOSIS — R188 Other ascites: Secondary | ICD-10-CM | POA: Diagnosis not present

## 2018-01-09 DIAGNOSIS — Z6825 Body mass index (BMI) 25.0-25.9, adult: Secondary | ICD-10-CM

## 2018-01-09 DIAGNOSIS — N5089 Other specified disorders of the male genital organs: Secondary | ICD-10-CM | POA: Diagnosis present

## 2018-01-09 DIAGNOSIS — N401 Enlarged prostate with lower urinary tract symptoms: Secondary | ICD-10-CM | POA: Diagnosis present

## 2018-01-09 DIAGNOSIS — Z902 Acquired absence of lung [part of]: Secondary | ICD-10-CM

## 2018-01-09 DIAGNOSIS — K31A0 Gastric intestinal metaplasia, unspecified: Secondary | ICD-10-CM

## 2018-01-09 DIAGNOSIS — R059 Cough, unspecified: Secondary | ICD-10-CM

## 2018-01-09 DIAGNOSIS — M5136 Other intervertebral disc degeneration, lumbar region: Secondary | ICD-10-CM | POA: Diagnosis present

## 2018-01-09 DIAGNOSIS — D62 Acute posthemorrhagic anemia: Secondary | ICD-10-CM | POA: Diagnosis not present

## 2018-01-09 DIAGNOSIS — J189 Pneumonia, unspecified organism: Secondary | ICD-10-CM | POA: Diagnosis not present

## 2018-01-09 DIAGNOSIS — G4730 Sleep apnea, unspecified: Secondary | ICD-10-CM | POA: Diagnosis present

## 2018-01-09 DIAGNOSIS — R410 Disorientation, unspecified: Secondary | ICD-10-CM | POA: Diagnosis not present

## 2018-01-09 DIAGNOSIS — N269 Renal sclerosis, unspecified: Secondary | ICD-10-CM | POA: Diagnosis present

## 2018-01-09 DIAGNOSIS — Z888 Allergy status to other drugs, medicaments and biological substances status: Secondary | ICD-10-CM

## 2018-01-09 DIAGNOSIS — E785 Hyperlipidemia, unspecified: Secondary | ICD-10-CM | POA: Diagnosis present

## 2018-01-09 DIAGNOSIS — K259 Gastric ulcer, unspecified as acute or chronic, without hemorrhage or perforation: Secondary | ICD-10-CM | POA: Diagnosis not present

## 2018-01-09 DIAGNOSIS — I50810 Right heart failure, unspecified: Secondary | ICD-10-CM | POA: Diagnosis not present

## 2018-01-09 DIAGNOSIS — Z7982 Long term (current) use of aspirin: Secondary | ICD-10-CM

## 2018-01-09 DIAGNOSIS — K269 Duodenal ulcer, unspecified as acute or chronic, without hemorrhage or perforation: Secondary | ICD-10-CM | POA: Diagnosis not present

## 2018-01-09 LAB — CBC WITH DIFFERENTIAL/PLATELET
Abs Immature Granulocytes: 0.18 10*3/uL — ABNORMAL HIGH (ref 0.00–0.07)
Basophils Absolute: 0 10*3/uL (ref 0.0–0.1)
Basophils Relative: 0 %
Eosinophils Absolute: 0 10*3/uL (ref 0.0–0.5)
Eosinophils Relative: 0 %
HCT: 21.1 % — ABNORMAL LOW (ref 39.0–52.0)
Hemoglobin: 6.4 g/dL — CL (ref 13.0–17.0)
Immature Granulocytes: 1 %
Lymphocytes Relative: 3 %
Lymphs Abs: 0.5 10*3/uL — ABNORMAL LOW (ref 0.7–4.0)
MCH: 23.4 pg — ABNORMAL LOW (ref 26.0–34.0)
MCHC: 30.3 g/dL (ref 30.0–36.0)
MCV: 77 fL — ABNORMAL LOW (ref 80.0–100.0)
Monocytes Absolute: 1.2 10*3/uL — ABNORMAL HIGH (ref 0.1–1.0)
Monocytes Relative: 6 %
Neutro Abs: 20 10*3/uL — ABNORMAL HIGH (ref 1.7–7.7)
Neutrophils Relative %: 90 %
Platelets: 361 10*3/uL (ref 150–400)
RBC: 2.74 MIL/uL — ABNORMAL LOW (ref 4.22–5.81)
RDW: 22 % — ABNORMAL HIGH (ref 11.5–15.5)
WBC: 22 10*3/uL — ABNORMAL HIGH (ref 4.0–10.5)
nRBC: 0.2 % (ref 0.0–0.2)

## 2018-01-09 LAB — BRAIN NATRIURETIC PEPTIDE: B NATRIURETIC PEPTIDE 5: 598 pg/mL — AB (ref 0.0–100.0)

## 2018-01-09 LAB — BASIC METABOLIC PANEL
ANION GAP: 11 (ref 5–15)
BUN: 78 mg/dL — ABNORMAL HIGH (ref 8–23)
CALCIUM: 7.5 mg/dL — AB (ref 8.9–10.3)
CO2: 21 mmol/L — ABNORMAL LOW (ref 22–32)
Chloride: 104 mmol/L (ref 98–111)
Creatinine, Ser: 2.6 mg/dL — ABNORMAL HIGH (ref 0.61–1.24)
GFR, EST AFRICAN AMERICAN: 26 mL/min — AB (ref >60)
GFR, EST NON AFRICAN AMERICAN: 22 mL/min — AB (ref >60)
GLUCOSE: 107 mg/dL — AB (ref 70–99)
POTASSIUM: 3 mmol/L — AB (ref 3.5–5.1)
SODIUM: 136 mmol/L (ref 135–145)

## 2018-01-09 LAB — CBC
HCT: 21.7 % — ABNORMAL LOW (ref 39.0–52.0)
Hemoglobin: 6.4 g/dL — CL (ref 13.0–17.0)
MCH: 23 pg — ABNORMAL LOW (ref 26.0–34.0)
MCHC: 29.5 g/dL — ABNORMAL LOW (ref 30.0–36.0)
MCV: 78.1 fL — ABNORMAL LOW (ref 80.0–100.0)
PLATELETS: 387 10*3/uL (ref 150–400)
RBC: 2.78 MIL/uL — ABNORMAL LOW (ref 4.22–5.81)
RDW: 21.8 % — ABNORMAL HIGH (ref 11.5–15.5)
WBC: 22.2 10*3/uL — AB (ref 4.0–10.5)
nRBC: 0.4 % — ABNORMAL HIGH (ref 0.0–0.2)

## 2018-01-09 LAB — SEDIMENTATION RATE: Sed Rate: 67 mm/hr — ABNORMAL HIGH (ref 0–16)

## 2018-01-09 LAB — HEPATIC FUNCTION PANEL
ALT: 30 U/L (ref 0–44)
AST: 54 U/L — AB (ref 15–41)
Albumin: 1.8 g/dL — ABNORMAL LOW (ref 3.5–5.0)
Alkaline Phosphatase: 80 U/L (ref 38–126)
Bilirubin, Direct: 0.1 mg/dL (ref 0.0–0.2)
Indirect Bilirubin: 0.3 mg/dL (ref 0.3–0.9)
TOTAL PROTEIN: 4.9 g/dL — AB (ref 6.5–8.1)
Total Bilirubin: 0.4 mg/dL (ref 0.3–1.2)

## 2018-01-09 LAB — COMPREHENSIVE METABOLIC PANEL WITH GFR
ALT: 33 U/L (ref 0–44)
AST: 56 U/L — ABNORMAL HIGH (ref 15–41)
Albumin: 1.6 g/dL — ABNORMAL LOW (ref 3.5–5.0)
Alkaline Phosphatase: 82 U/L (ref 38–126)
Anion gap: 15 (ref 5–15)
BUN: 76 mg/dL — ABNORMAL HIGH (ref 8–23)
CO2: 21 mmol/L — ABNORMAL LOW (ref 22–32)
Calcium: 7.6 mg/dL — ABNORMAL LOW (ref 8.9–10.3)
Chloride: 102 mmol/L (ref 98–111)
Creatinine, Ser: 2.72 mg/dL — ABNORMAL HIGH (ref 0.61–1.24)
GFR calc Af Amer: 25 mL/min — ABNORMAL LOW
GFR calc non Af Amer: 21 mL/min — ABNORMAL LOW
Glucose, Bld: 106 mg/dL — ABNORMAL HIGH (ref 70–99)
Potassium: 3.6 mmol/L (ref 3.5–5.1)
Sodium: 138 mmol/L (ref 135–145)
Total Bilirubin: 0.4 mg/dL (ref 0.3–1.2)
Total Protein: 4.7 g/dL — ABNORMAL LOW (ref 6.5–8.1)

## 2018-01-09 LAB — PREPARE RBC (CROSSMATCH)

## 2018-01-09 LAB — PROTIME-INR
INR: 1.21
Prothrombin Time: 15.2 s (ref 11.4–15.2)

## 2018-01-09 LAB — POCT HEMOGLOBIN: Hemoglobin: 6.2 g/dL — AB (ref 11–14.6)

## 2018-01-09 LAB — POC OCCULT BLOOD, ED: Fecal Occult Bld: POSITIVE — AB

## 2018-01-09 LAB — LIPASE, BLOOD: Lipase: 48 U/L (ref 11–51)

## 2018-01-09 MED ORDER — ACETAMINOPHEN 325 MG PO TABS
650.0000 mg | ORAL_TABLET | Freq: Four times a day (QID) | ORAL | Status: DC | PRN
  Filled 2018-01-09: qty 2

## 2018-01-09 MED ORDER — SODIUM CHLORIDE 0.9% IV SOLUTION
Freq: Once | INTRAVENOUS | Status: AC
  Administered 2018-01-09: 18:00:00 via INTRAVENOUS

## 2018-01-09 MED ORDER — ACETAMINOPHEN 650 MG RE SUPP: 650.0000 mg | Freq: Four times a day (QID) | RECTAL | Status: DC | PRN

## 2018-01-09 MED ORDER — FUROSEMIDE 10 MG/ML IJ SOLN
60.0000 mg | Freq: Once | INTRAMUSCULAR | Status: AC
  Administered 2018-01-09: 60 mg via INTRAVENOUS
  Filled 2018-01-09: qty 6

## 2018-01-09 MED ORDER — TORSEMIDE 20 MG PO TABS: ORAL_TABLET | ORAL | 3 refills | Status: DC

## 2018-01-09 MED ORDER — ONDANSETRON HCL 4 MG PO TABS: 4.0000 mg | ORAL_TABLET | Freq: Four times a day (QID) | ORAL | Status: DC | PRN

## 2018-01-09 MED ORDER — ONDANSETRON HCL 4 MG/2ML IJ SOLN: 4.0000 mg | Freq: Four times a day (QID) | INTRAMUSCULAR | Status: DC | PRN

## 2018-01-09 NOTE — Patient Instructions (Signed)
Stop furosemide Stop hydralazine  Start torsemide 20 mg, take 2 dialy ( best in the morning) may start today

## 2018-01-09 NOTE — Progress Notes (Signed)
Subjective:    Patient ID: Andrew Guerra, male    DOB: 1940-02-07, 77 y.o.   MRN: [id number]  Hypertension  This is a chronic problem. Associated symptoms include shortness of breath. Pertinent negatives include no chest pain or headaches. There are no compliance problems.   He is taking blood pressure medicine as recommended but he does state that when he stands up he feels somewhat lightheaded  He also states he has been taking the Lasix as directed but he states it does not seem to cause any type of increased urination   Pt wife states patient has had two iron infusions but they have not produced any results at this time.  Patient was in the hospital because of GI bleed back in October.  Had endoscopy from GI that did show ulcers.  The patient also had a couple different iron infusions which have not resulted in any improvement of his symptoms.    Pt still has swelling bilaterally in legs.  Over the past month he is noted progressive swelling in the legs it is now causing scrotal swelling and also causing him to have shortness of breath Patient's appetite is poor not able to move around without passing out but he does get short of breath with activity.  He denies PND denies orthopnea currently   Review of Systems  Constitutional: Negative for diaphoresis and fatigue.  HENT: Negative for congestion and rhinorrhea.   Respiratory: Positive for shortness of breath. Negative for cough.   Cardiovascular: Negative for chest pain and leg swelling.  Gastrointestinal: Negative for abdominal pain and diarrhea.  Musculoskeletal: Positive for arthralgias. Negative for back pain.  Skin: Negative for color change and rash.  Neurological: Positive for weakness and light-headedness. Negative for dizziness, numbness and headaches.  Psychiatric/Behavioral: Negative for behavioral problems and confusion.       Objective:   Physical Exam Vitals signs reviewed.  Constitutional:      General: He is  not in acute distress. HENT:     Head: Normocephalic and atraumatic.  Eyes:     General:        Right eye: No discharge.        Left eye: No discharge.  Neck:     Trachea: No tracheal deviation.  Cardiovascular:     Rate and Rhythm: Normal rate and regular rhythm.     Heart sounds: Normal heart sounds. No murmur.  Pulmonary:     Effort: Pulmonary effort is normal. No respiratory distress.     Breath sounds: Normal breath sounds.  Musculoskeletal:     Right lower leg: Edema present.     Left lower leg: Edema present.  Lymphadenopathy:     Cervical: No cervical adenopathy.  Skin:    General: Skin is warm and dry.  Neurological:     Mental Status: He is alert.     Coordination: Coordination normal.  Psychiatric:        Behavior: Behavior normal.    Patient has significant scrotal and pedal swelling as well Severe pedal edema both legs all the way up to the upper thigh  Fingerstick hemoglobin very low 6.2 patient appears pale    Assessment & Plan:  Stat lab work Anemia-gastroenterology consulted hematology a few weeks ago but I do not see any evidence of this appointment being made  It is possible that his hemoglobin could be low related to the hydralazine.  This patient has had 2 iron infusions without seeing any significant improvement.  Most recent ferritin and B12 looks good.  His gastroenterologist consulted hematology but so far no appointment.  Rare side effect can cause suppression of the bone marrow.  I recommend stopping the hydralazine.  Patient aware.  Patient does have some orthostatic readings.  Stop hydralazine  Clinical response to Lasix subpar-check lab work to look for exacerbation of CHF also look at renal function plus also switch to [facility]  Dietary intake is been poor lately.  Albumin has been going lower.  This can certainly contribute to his pedal edema.  Once lab work comes back patient may need to be admitted into the hospital but he would prefer  further testing first therefore stat lab work was ordered await the results  25 minutes was spent with the patient.  This statement verifies that 25 minutes was indeed spent with the patient.  More than 50% of this visit-total duration of the visit-was spent in counseling and coordination of care. The issues that the patient came in for today as reflected in the diagnosis (s) please refer to documentation for further details.  I did discuss the results of the lab work with the patient and his wife over the phone   Addendum-lab work came back showing critically low hemoglobin 6.4 along with elevated WBCs.  Platelets were normal. In addition to this creatinine is now significantly elevated with elevated BUN.  Yet the patient has evidence of fluid overload.  BNP is also significantly elevated. Based upon all these numbers I believe it is in the patient's best interest to go to the emergency department at [facility].  He will need evaluation for anemia, more than likely require a couple units of red blood cells, also fluid management/diuresis  This patient would also benefit from a hematology consult-if they are unable to get this while he is in the hospital we will arrange for this after he comes home

## 2018-01-09 NOTE — ED Notes (Signed)
Patient transported to CT 

## 2018-01-09 NOTE — ED Triage Notes (Signed)
The pt is here for a low hgb  He had a regular appointment   With his doctor this am  And he was called stating that he had a hgb of 6.5  No vomiting blood no rectal bleeding

## 2018-01-09 NOTE — H&P (Signed)
History and Physical    Andrew Guerra UYQ:[id number] DOB: March 19, 1940 DOA: 01/09/2018  PCP: [name], MD  Patient coming from: Home  I have personally briefly reviewed patient's old medical records in [place]  Chief Complaint: Low HGB  HPI: Andrew Guerra is a 77 y.o. male with medical history significant of Mod AS, NSCLC of R lung, PUD diagnosed on admission in Oct, non-bleeding at that time on EGD.  Patient presents to the ED after PCP found HGB 6.5 today.  He had gone in to his PCPs office today due to SOB over the past few days, dry cough, increased peripheral edema.  He reports one episode of black stools today.   ED Course: Brown stool but hemoccult positive, HGB 6.4.  CXR shows increasing B pleural effusions.  Patient given 40 IV lasix, 2u PRBC transfusion ordered.   Review of Systems: As per HPI otherwise 10 point review of systems negative.   Past Medical History:  Diagnosis Date  . 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  [name] Cataract    right eye - surgery to remove  . Elevated PSA 11/29/2016   Patient is followed by [facility].  Most recent PSA near 11.  He has had atypia on biopsy.  November 2008  . Essential hypertension, benign 06/08/2012  . Full dentures   . Glaucoma   . Heart murmur    since childhood, never has caused any problems  . History of rheumatic fever 11/04/2015  . Hyperlipidemia 04/25/2013  . Hypertension   . Malignant neoplasm of left lung ([place]) 02/08/2017  . RBBB 07/12/2017  . Sickle cell trait ([place])    no problems per patient    Past Surgical History:  Procedure Laterality Date  . BIOPSY  11/11/2017   Procedure: BIOPSY;  Surgeon: Lavena Bullion, DO;  Location: [facility];  Service: Gastroenterology;;  . cataract eye surgery Right   . COLONOSCOPY  11/2009   hx polyps/[place]  .  ESOPHAGOGASTRODUODENOSCOPY N/A 11/11/2017   Procedure: ESOPHAGOGASTRODUODENOSCOPY (EGD);  Surgeon: Lavena Bullion, DO;  Location: [facility];  Service: Gastroenterology;  Laterality: N/A;  . PROSTATE BIOPSY    . right eye surgery     to lower eye pressure  . TONSILLECTOMY    . VIDEO ASSISTED THORACOSCOPY (VATS)/ LOBECTOMY Right 12/26/2015  . wisdom tteeth ext       reports that he quit smoking about 6 years ago. His smoking use included cigarettes. He smoked 1.00 pack per day. He has never used smokeless tobacco. He reports current alcohol use of about 1.0 standard drinks of alcohol per week. He reports that he does not use drugs.  Allergies  Allergen Reactions  . Atorvastatin     Achy and tired    Family History  Problem Relation Age of Onset  . Diabetes Mother   . Cerebral aneurysm Mother   . Lung cancer Father   . Diabetes Sister   . Colon cancer Neg Hx   . Colon polyps Neg Hx   . Esophageal cancer Neg Hx   . Rectal cancer Neg Hx   . Stomach cancer Neg Hx      Prior to Admission medications   Medication Sig Start Date End Date Taking? Authorizing Provider  amLODipine (NORVASC) 2.5 MG tablet Take 1 tablet (2.5 mg total) by mouth daily. 11/07/17   Sallee Lange  A, MD  aspirin EC 81 MG tablet Take 81 mg by mouth daily.    [provider]  Brinzolamide-Brimonidine (SIMBRINZA) 1-0.2 % SUSP Place 2-3 drops into both eyes 3 (three) times daily.     [provider]  budesonide-formoterol (SYMBICORT) 80-4.5 MCG/ACT inhaler Inhale 2 puffs into the lungs 2 (two) times daily. 11/01/17   [name], MD  ketoconazole (NIZORAL) 2 % cream Apply to area twice daily 01/03/18   [name], MD  metoprolol succinate (TOPROL-XL) 50 MG 24 hr tablet Take 1/2 tablet daily.Take with or immediately following a meal. 06/23/17   [name], MD  Multiple Vitamin (MULTIVITAMIN) tablet Take 1 tablet by mouth daily.    [provider]  Netarsudil-Latanoprost  (ROCKLATAN) 0.02-0.005 % SOLN Place 1 drop into both eyes at bedtime.     Marylynn Pearson, MD  pantoprazole (PROTONIX) 40 MG tablet Take 1 tablet (40 mg total) by mouth 2 (two) times daily. 11/30/17 01/29/18  Willia Craze, NP  timolol (BETIMOL) 0.5 % ophthalmic solution Place 1 drop into both eyes 2 (two) times daily.     [provider]  torsemide (DEMADEX) 20 MG tablet Take two tablets by mouth every morning 01/09/18   [name], MD    Physical Exam: Vitals:   01/09/18 1915 01/09/18 1945 01/09/18 2045 01/09/18 2130  BP: (!) 158/71 (!) 171/86 (!) 156/69   Pulse: 84 87 88   Resp: (!) 27 (!) 29 (!) 24   Temp:    98.2 F (36.8 C)  TempSrc:    Oral  SpO2: 100% 97% 98%   Weight:      Height:        Constitutional: NAD, calm, comfortable Eyes: PERRL, lids and conjunctivae normal ENMT: Mucous membranes are moist. Posterior pharynx clear of any exudate or lesions.Normal dentition.  Neck: normal, supple, no masses, no thyromegaly Respiratory: clear to auscultation bilaterally, no wheezing, no crackles. Normal respiratory effort. No accessory muscle use.  Cardiovascular: Regular rate and rhythm, no murmurs / rubs / gallops. No extremity edema. 2+ pedal pulses. No carotid bruits.  Abdomen: no tenderness, no masses palpated. No hepatosplenomegaly. Bowel sounds positive.  Musculoskeletal: no clubbing / cyanosis. No joint deformity upper and lower extremities. Good ROM, no contractures. Normal muscle tone.  Skin: no rashes, lesions, ulcers. No induration Neurologic: CN 2-12 grossly intact. Sensation intact, DTR normal. Strength 5/5 in all 4.  Psychiatric: Normal judgment and insight. Alert and oriented x 3. Normal mood.    Labs on Admission: I have personally reviewed following labs and imaging studies  CBC: Recent Labs  Lab 01/09/18 1052 01/09/18 1204 01/09/18 1651  WBC  --  22.0* 22.2*  NEUTROABS  --  20.0*  --   HGB 6.2* 6.4* 6.4*  HCT  --  21.1* 21.7*  MCV  --   77.0* 78.1*  PLT  --  361 703   Basic Metabolic Panel: Recent Labs  Lab 01/09/18 1204 01/09/18 1651  NA 136 138  K 3.0* 3.6  CL 104 102  CO2 21* 21*  GLUCOSE 107* 106*  BUN 78* 76*  CREATININE 2.60* 2.72*  CALCIUM 7.5* 7.6*   GFR: Estimated Creatinine Clearance: 25.2 mL/min (A) (by C-G formula based on SCr of 2.72 mg/dL (H)). Liver Function Tests: Recent Labs  Lab 01/09/18 1204 01/09/18 1651  AST 54* 56*  ALT 30 33  ALKPHOS 80 82  BILITOT 0.4 0.4  PROT 4.9* 4.7*  ALBUMIN 1.8* 1.6*  Recent Labs  Lab 01/09/18 1651  LIPASE 48   No results for input(s): AMMONIA in the last 168 hours. Coagulation Profile: Recent Labs  Lab 01/09/18 1651  INR 1.21   Cardiac Enzymes: No results for input(s): CKTOTAL, CKMB, CKMBINDEX, TROPONINI in the last 168 hours. BNP (last 3 results) No results for input(s): PROBNP in the last 8760 hours. HbA1C: No results for input(s): HGBA1C in the last 72 hours. CBG: No results for input(s): GLUCAP in the last 168 hours. Lipid Profile: No results for input(s): CHOL, HDL, LDLCALC, TRIG, CHOLHDL, LDLDIRECT in the last 72 hours. Thyroid Function Tests: No results for input(s): TSH, T4TOTAL, FREET4, T3FREE, THYROIDAB in the last 72 hours. Anemia Panel: No results for input(s): VITAMINB12, FOLATE, FERRITIN, TIBC, IRON, RETICCTPCT in the last 72 hours. Urine analysis: No results found for: COLORURINE, APPEARANCEUR, [place], [place], GLUCOSEU, HGBUR, BILIRUBINUR, KETONESUR, PROTEINUR, UROBILINOGEN, NITRITE, LEUKOCYTESUR  Radiological Exams on Admission: Ct Abdomen Pelvis Wo Contrast  Result Date: 01/09/2018 CLINICAL DATA:  Low hemoglobin. EXAM: CT ABDOMEN AND PELVIS WITHOUT CONTRAST TECHNIQUE: Multidetector CT imaging of the abdomen and pelvis was performed following the standard protocol without IV contrast. COMPARISON:  CT 02/04/2015, abdomen MRI 12/09/2017 FINDINGS: Lower chest: Small bilateral pleural effusions. Cardiomegaly without  pericardial effusion or thickening. Atherosclerosis of the included thoracic aorta without aneurysm. Coronary arteriosclerosis is seen. Pulmonary consolidation/atelectasis in the right middle lobe distribution. Hepatobiliary: Layering biliary sludge within the gallbladder without secondary signs of acute cholecystitis. The unenhanced liver demonstrates a granuloma in the right hepatic lobe. No definite mass or biliary dilatation. Pancreas: Unremarkable. No pancreatic ductal dilatation or surrounding inflammatory changes. Spleen: Normal size spleen with scattered calcifications noted within possibly representing vascular calcifications or granulomata. Adrenals/Urinary Tract: Scattered bilateral hyperdense lesions of the kidneys are nonspecific but may represent proteinaceous or hemorrhagic cysts, the largest is right-sided and interpolar measuring up 1.6 cm with more ill-defined hyperdensity slightly exophytic off the right kidney. No nephrolithiasis or obstructive uropathy. The urinary bladder is unremarkable for the degree of distention. Stomach/Bowel: Stomach is within normal limits. The appendix is not confidently identified. No evidence of bowel wall thickening, distention, or inflammatory changes. Vascular/Lymphatic: Aortic atherosclerosis. No lymphadenopathy. Reproductive: Prostatomegaly measuring up to 5.7 cm. Other: Diffuse soft tissue anasarca. No ascites or free air. 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 IMPRESSION: 1. Pulmonary consolidation/atelectasis in the right middle lobe with small bilateral pleural effusions. 2. Scattered bilateral hyperdense lesions of the kidneys likely to represent proteinaceous or hemorrhagic cysts, the largest is in the right interpolar and interpolar measuring 1.6 cm with more ill-defined hyperdensity slightly exophytic off the right kidney. Findings better characterized on recent prior MRI. 3. Hepatic and splenic granulomata. 4. Diffuse soft tissue  anasarca. 5. 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 Aortic Atherosclerosis (ICD10-I70.0). Electronically Signed   By: Ashley Royalty M.D.   On: 01/09/2018 20:33   Dg Chest Portable 1 View  Result Date: 01/09/2018 CLINICAL DATA:  Shortness of breath for 1 day. 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 EXAM: PORTABLE CHEST 1 VIEW COMPARISON:  Radiographs 11/09/2017. Abdominal MRI 12/09/2017. PET-CT 11/25/2015. FINDINGS: 1902 hours. There are right-greater-than-left pleural effusions which have enlarged. There is associated bibasilar pulmonary opacity, likely atelectasis. There are stable postsurgical changes in the right hilar region. No pneumothorax or acute osseous findings are seen. IMPRESSION: Enlarging right-greater-than-left pleural effusions with associated bibasilar pulmonary opacities, likely atelectasis. No invert pulmonary edema. Electronically Signed   By: Richardean Sale M.D.   On: 01/09/2018  19:35    EKG: Independently reviewed.  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 GI bleed - due to PUD 1. Clear liquid diet 2. 2u PRBC transfusion 3. Call GI in AM 4. Tele monitor 5. Cont home PPI 2. Acute on chronic diastolic CHF - acute component likely due to anemia 1. 40mg  IV lasix given in ED before first transfusion 2. Tele monitor 3. Transfuse slowly 4. Continue home meds, starting the torsemide which it looks like he hasnt started yet. 3. HTN - continue home meds 4. NSCLC - 1. Looks like patient had this surgically resected, then had recurrence followed by SBRT with shrinking of the recurrent tumor according to oncology office visit notes. 2. Dont see any mets in abd on CT today. 3. Kidney lesions on CT today but favored to be benign on 11/22 MRI  DVT  prophylaxis: SCDs Code Status: Full Family Communication: Family at bedside Disposition Plan: Home after admit Consults called: None Admission status: Admit to inpatient  Severity of Illness: The appropriate patient status for this patient is INPATIENT. Inpatient status is judged to be reasonable and necessary in order to provide the required intensity of service to ensure the patient's safety. The patient's presenting symptoms, physical exam findings, and initial radiographic and laboratory data in the context of their chronic comorbidities is felt to place them at high risk for further clinical deterioration. Furthermore, it is not anticipated that the patient will be medically stable for discharge from the hospital within 2 midnights of admission. The following factors support the patient status of inpatient.   " The patient's presenting symptoms include SOB. " The worrisome physical exam findings include peripheral edema. " The initial radiographic and laboratory data are worrisome because of HGB 6.4, increasing pleural effusions. " The chronic co-morbidities include CHF, AS, NSCLC.   * I certify that at the point of admission it is my clinical judgment that the patient will require inpatient hospital care spanning beyond 2 midnights from the point of admission due to high intensity of service, high risk for further deterioration and high frequency of surveillance required.[name] DO [facility] Pager [phone] Only works nights!  If 7AM-7PM, please contact the primary day team physician taking care of patient  [email or web address] Password [email or web address]  01/09/2018, 11:13 PM

## 2018-01-09 NOTE — ED Notes (Signed)
Pt observes to have small amount of dark diarrhea while urinating. This NT cleaned pt and placed back into bed. Pt saturations dropped while standing using the urinal. [place], RN asked pt to not stand while urinating. Pt understood concern for not standing after urinating.

## 2018-01-09 NOTE — ED Triage Notes (Signed)
The pt gets iron infusions

## 2018-01-09 NOTE — ED Provider Notes (Signed)
[facility] EMERGENCY DEPARTMENT Provider Note   CSN: [id number] Arrival date & time: 01/09/18  1548     History   Chief Complaint Chief Complaint  Patient presents with  . low hgb    HPI Andrew Guerra is a 77 y.o. male.  Patient is a 77 year old male who presents with low hemoglobin.  He has a history of colonic polyps, recent admission in October for GI bleed related to peptic ulcer disease.  He had an EGD done at that time which showed nonbleeding gastric and duodenal ulcers.  There was some evidence of chronic gastritis.  He also has a history of CHF.  He states he has had some trouble breathing over the last few days.  He has a dry cough.  Has had some increased swelling of his lower extremities.  He has not noted any blood in his stool but he has had one episode of black stools today.  He has some pain along his left abdomen.  No known fevers.  No nausea or vomiting.  He went to his PCP today who did some blood work and found his hemoglobin to be 6.5.     Past Medical History:  Diagnosis Date  . 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  [name] Cataract    right eye - surgery to remove  . Elevated PSA 11/29/2016   Patient is followed by [facility].  Most recent PSA near 11.  He has had atypia on biopsy.  November 2008  . Essential hypertension, benign 06/08/2012  . Full dentures   . Glaucoma   . Heart murmur    since childhood, never has caused any problems  . History of rheumatic fever 11/04/2015  . Hyperlipidemia 04/25/2013  . Hypertension   . Malignant neoplasm of left lung ([place]) 02/08/2017  . RBBB 07/12/2017  . Sickle cell trait ([place])    no problems per patient    Patient Active Problem List   Diagnosis Date Noted  . GI bleed 01/09/2018  . Acute respiratory failure with hypoxia ([place]) 11/12/2017  . Gastritis and  gastroduodenitis   . Acute gastric ulcer without hemorrhage or perforation   . Duodenal ulcer   . Paraesophageal hernia   . Upper GI bleeding 11/09/2017  . CKD (chronic kidney disease), stage III ([place]) 11/09/2017  . Normocytic anemia 11/09/2017  . Acute on chronic diastolic CHF (congestive heart failure) ([place]) 11/09/2017  . Melena   . RBBB 07/12/2017  . 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 Malignant neoplasm of left lung ([place]) 02/08/2017  . Elevated PSA 11/29/2016  . Heart murmur 11/04/2015  . History of active rheumatic fever 11/04/2015  . Solitary pulmonary nodule 08/31/2013  . 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 Thyroid nodule 08/31/2013  . Hyperlipidemia 04/25/2013  . Essential hypertension, benign 06/08/2012    Past Surgical History:  Procedure Laterality Date  . BIOPSY  11/11/2017   Procedure: BIOPSY;  Surgeon: Lavena Bullion, DO;  Location: [facility];  Service: Gastroenterology;;  . cataract eye surgery Right   . COLONOSCOPY  11/2009   hx polyps/[place]  . ESOPHAGOGASTRODUODENOSCOPY N/A 11/11/2017   Procedure: ESOPHAGOGASTRODUODENOSCOPY (EGD);  Surgeon: Lavena Bullion, DO;  Location: [facility] END[facility]COPY;  Service: Gastroenterology;  Laterality: N/A;  . PROSTATE BIOPSY    . right eye surgery     to lower eye  pressure  . TONSILLECTOMY    . VIDEO ASSISTED THORACOSCOPY (VATS)/ LOBECTOMY Right 12/26/2015  . wisdom tteeth ext          Home Medications    Prior to Admission medications   Medication Sig Start Date End Date Taking? Authorizing Provider  amLODipine (NORVASC) 2.5 MG tablet Take 1 tablet (2.5 mg total) by mouth daily. 11/07/17  Yes [name], MD  aspirin EC 81 MG tablet Take 81 mg by mouth daily.   Yes [provider]  Brinzolamide-Brimonidine (SIMBRINZA) 1-0.2 % SUSP Place 2-3 drops into both eyes 3 (three) times daily.    Yes [provider]  budesonide-formoterol (SYMBICORT) 80-4.5 MCG/ACT inhaler  Inhale 2 puffs into the lungs 2 (two) times daily. 11/01/17  Yes [name], MD  metoprolol succinate (TOPROL-XL) 50 MG 24 hr tablet Take 1/2 tablet daily.Take with or immediately following a meal. 06/23/17  Yes Luking, Elayne Snare, MD  Multiple Vitamin (MULTIVITAMIN) tablet Take 1 tablet by mouth daily.   Yes [provider]  Netarsudil-Latanoprost (ROCKLATAN) 0.02-0.005 % SOLN Place 1 drop into both eyes at bedtime.    Yes Marylynn Pearson, MD  pantoprazole (PROTONIX) 40 MG tablet Take 1 tablet (40 mg total) by mouth 2 (two) times daily. 11/30/17 01/29/18 Yes Willia Craze, NP  ketoconazole (NIZORAL) 2 % cream Apply to area twice daily Patient not taking: Reported on 01/09/2018 01/03/18   [name], MD  torsemide [facility]) 20 MG tablet Take two tablets by mouth every morning Patient not taking: Reported on 01/09/2018 01/09/18   [name], MD    Family History Family History  Problem Relation Age of Onset  . Diabetes Mother   . Cerebral aneurysm Mother   . Lung cancer Father   . Diabetes Sister   . Colon cancer Neg Hx   . Colon polyps Neg Hx   . Esophageal cancer Neg Hx   . Rectal cancer Neg Hx   . Stomach cancer Neg Hx     Social History Social History   Tobacco Use  . Smoking status: Former Smoker    Packs/day: 1.00    Types: Cigarettes    Last attempt to quit: 01/19/2011    Years since quitting: 6.9  . Smokeless tobacco: Never Used  Substance Use Topics  . Alcohol use: Yes    Alcohol/week: 1.0 standard drinks    Types: 1 Shots of liquor per week  . Drug use: No     Allergies   Atorvastatin   Review of Systems Review of Systems  Constitutional: Positive for fatigue. Negative for chills, diaphoresis and fever.  HENT: Negative for congestion, rhinorrhea and sneezing.   Eyes: Negative.   Respiratory: Positive for shortness of breath. Negative for cough and chest tightness.   Cardiovascular: Positive for leg swelling. Negative for chest pain.    Gastrointestinal: Positive for abdominal pain. Negative for blood in stool, diarrhea, nausea and vomiting.  Genitourinary: Negative for difficulty urinating, flank pain, frequency and hematuria.  Musculoskeletal: Negative for arthralgias and back pain.  Skin: Negative for rash.  Neurological: Negative for dizziness, speech difficulty, weakness, numbness and headaches.     Physical Exam Updated Vital Signs BP 140/69   Pulse 81   Temp 98.2 F (36.8 C) (Oral)   Resp (!) 28   Ht 5' 10.5" (1.791 m)   Wt 90.7 kg   SpO2 95%   BMI 28.29 kg/m   Physical Exam Constitutional:      Appearance:  He is well-developed.  HENT:     Head: Normocephalic and atraumatic.  Eyes:     Pupils: Pupils are equal, round, and reactive to light.  Neck:     Musculoskeletal: Normal range of motion and neck supple.  Cardiovascular:     Rate and Rhythm: Normal rate and regular rhythm.     Heart sounds: Normal heart sounds.  Pulmonary:     Effort: Pulmonary effort is normal. Tachypnea present. No respiratory distress.     Breath sounds: Normal breath sounds. No wheezing or rales.  Chest:     Chest wall: No tenderness.  Abdominal:     General: Bowel sounds are normal.     Palpations: Abdomen is soft.     Tenderness: There is no abdominal tenderness. There is no guarding or rebound.  Musculoskeletal: Normal range of motion.        General: Swelling present.     Comments: 3+ pitting edema bilaterally  Lymphadenopathy:     Cervical: No cervical adenopathy.  Skin:    General: Skin is warm and dry.     Findings: No rash.  Neurological:     Mental Status: He is alert and oriented to person, place, and time.      ED Treatments / Results  Labs (all labs ordered are listed, but only abnormal results are displayed) Labs Reviewed  COMPREHENSIVE METABOLIC PANEL - Abnormal; Notable for the following components:      Result Value   CO2 21 (*)    Glucose, Bld 106 (*)    BUN 76 (*)    Creatinine, Ser  2.72 (*)    Calcium 7.6 (*)    Total Protein 4.7 (*)    Albumin 1.6 (*)    AST 56 (*)    GFR calc non Af Amer 21 (*)    GFR calc Af Amer 25 (*)    All other components within normal limits  CBC - Abnormal; Notable for the following components:   WBC 22.2 (*)    RBC 2.78 (*)    Hemoglobin 6.4 (*)    HCT 21.7 (*)    MCV 78.1 (*)    MCH 23.0 (*)    MCHC 29.5 (*)    RDW 21.8 (*)    nRBC 0.4 (*)    All other components within normal limits  POC OCCULT BLOOD, ED - Abnormal; Notable for the following components:   Fecal Occult Bld POSITIVE (*)    All other components within normal limits  LIPASE, BLOOD  PROTIME-INR  HEMOGLOBIN AND HEMATOCRIT, BLOOD  TYPE AND SCREEN  PREPARE RBC (CROSSMATCH)    EKG EKG Interpretation  Date/Time:  Monday January 09 2018 16:38:17 EST Ventricular Rate:  81 PR Interval:  152 QRS Duration: 158 QT Interval:  444 QTC Calculation: 515 R Axis:   82 Text Interpretation:  Normal sinus rhythm Right bundle branch block Cannot rule out Inferior infarct , age undetermined Abnormal ECG since last tracing no significant change Confirmed by Malvin Johns [id number]) on 01/09/2018 5:04:20 PM   Radiology Ct Abdomen Pelvis Wo Contrast  Result Date: 01/09/2018 CLINICAL DATA:  Low hemoglobin. EXAM: CT ABDOMEN AND PELVIS WITHOUT CONTRAST TECHNIQUE: Multidetector CT imaging of the abdomen and pelvis was performed following the standard protocol without IV contrast. COMPARISON:  CT 02/04/2015, abdomen MRI 12/09/2017 FINDINGS: Lower chest: Small bilateral pleural effusions. Cardiomegaly without pericardial effusion or thickening. Atherosclerosis of the included thoracic aorta without aneurysm. Coronary arteriosclerosis is seen. Pulmonary consolidation/atelectasis in the right  middle lobe distribution. Hepatobiliary: Layering biliary sludge within the gallbladder without secondary signs of acute cholecystitis. The unenhanced liver demonstrates a granuloma in the right  hepatic lobe. No definite mass or biliary dilatation. Pancreas: Unremarkable. No pancreatic ductal dilatation or surrounding inflammatory changes. Spleen: Normal size spleen with scattered calcifications noted within possibly representing vascular calcifications or granulomata. Adrenals/Urinary Tract: Scattered bilateral hyperdense lesions of the kidneys are nonspecific but may represent proteinaceous or hemorrhagic cysts, the largest is right-sided and interpolar measuring up 1.6 cm with more ill-defined hyperdensity slightly exophytic off the right kidney. No nephrolithiasis or obstructive uropathy. The urinary bladder is unremarkable for the degree of distention. Stomach/Bowel: Stomach is within normal limits. The appendix is not confidently identified. No evidence of bowel wall thickening, distention, or inflammatory changes. Vascular/Lymphatic: Aortic atherosclerosis. No lymphadenopathy. Reproductive: Prostatomegaly measuring up to 5.7 cm. Other: Diffuse soft tissue anasarca. No ascites or free air. 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 IMPRESSION: 1. Pulmonary consolidation/atelectasis in the right middle lobe with small bilateral pleural effusions. 2. Scattered bilateral hyperdense lesions of the kidneys likely to represent proteinaceous or hemorrhagic cysts, the largest is in the right interpolar and interpolar measuring 1.6 cm with more ill-defined hyperdensity slightly exophytic off the right kidney. Findings better characterized on recent prior MRI. 3. Hepatic and splenic granulomata. 4. Diffuse soft tissue anasarca. 5. 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 Aortic Atherosclerosis (ICD10-I70.0). Electronically Signed   By: Ashley Royalty M.D.   On: 01/09/2018 20:33   Dg Chest Portable 1 View  Result Date: 01/09/2018 CLINICAL DATA:  Shortness of breath for 1 day. 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 EXAM: PORTABLE CHEST 1 VIEW COMPARISON:  Radiographs 11/09/2017.  Abdominal MRI 12/09/2017. PET-CT 11/25/2015. FINDINGS: 1902 hours. There are right-greater-than-left pleural effusions which have enlarged. There is associated bibasilar pulmonary opacity, likely atelectasis. There are stable postsurgical changes in the right hilar region. No pneumothorax or acute osseous findings are seen. IMPRESSION: Enlarging right-greater-than-left pleural effusions with associated bibasilar pulmonary opacities, likely atelectasis. No invert pulmonary edema. Electronically Signed   By: Richardean Sale M.D.   On: 01/09/2018 19:35    Procedures Procedures (including critical care time)  Medications Ordered in ED Medications  acetaminophen (TYLENOL) tablet 650 mg (has no administration in time range)    Or  acetaminophen (TYLENOL) suppository 650 mg (has no administration in time range)  ondansetron (ZOFRAN) tablet 4 mg (has no administration in time range)    Or  ondansetron (ZOFRAN) injection 4 mg (has no administration in time range)  furosemide (LASIX) injection 60 mg (60 mg Intravenous Given 01/09/18 1805)  0.9 %  sodium chloride infusion (Manually program via Guardrails IV Fluids) ( Intravenous New Bag/Given 01/09/18 1808)     Initial Impression / Assessment and Plan / ED Course  I have reviewed the triage vital signs and the nursing notes.  Pertinent labs & imaging results that were available during my care of the patient were reviewed by me and considered in my medical decision making (see chart for details).     Patient is 77 year old male who presents with low hemoglobin.  He was typed and crossed for 2 units of packed red cells.  He is Hemoccult positive but no gross blood on rectal exam.  He did have an elevated WBC count 22 and did have some mild abdominal tenderness on exam.  Therefore CT was performed which shows no acute abnormalities.  He has some renal cysts that have been evaluated already  by MRI.  Chest x-ray does not show pneumonia.  He does have  suggestions of fluid overload and some shortness of breath.  He was given a dose of Lasix prior to the blood transfusion.  He does not have ischemic changes on EKG.  I spoke with Dr. Alcario Drought with the hospitalist service to admit the patient for further treatment.  CRITICAL CARE Performed by: Malvin Johns Total critical care time: 60 minutes Critical care time was exclusive of separately billable procedures and treating other patients. Critical care was necessary to treat or prevent imminent or life-threatening deterioration. Critical care was time spent personally by me on the following activities: development of treatment plan with patient and/or surrogate as well as nursing, discussions with consultants, evaluation of patient's response to treatment, examination of patient, obtaining history from patient or surrogate, ordering and performing treatments and interventions, ordering and review of laboratory studies, ordering and review of radiographic studies, pulse oximetry and re-evaluation of patient's condition.   Final Clinical Impressions(s) / ED Diagnoses   Final diagnoses:  Gastrointestinal hemorrhage, unspecified gastrointestinal hemorrhage type  Iron deficiency anemia, unspecified iron deficiency anemia type  Congestive heart failure, unspecified HF chronicity, unspecified heart failure type [facility])    ED Discharge Orders    None       Malvin Johns, MD 01/10/18 [phone]

## 2018-01-10 ENCOUNTER — Inpatient Hospital Stay (HOSPITAL_COMMUNITY): Payer: Medicare Other | Admitting: Certified Registered Nurse Anesthetist

## 2018-01-10 ENCOUNTER — Other Ambulatory Visit: Payer: Self-pay

## 2018-01-10 ENCOUNTER — Encounter (HOSPITAL_COMMUNITY)
Admission: EM | Disposition: A | Discharge status: Home Health | Payer: Self-pay | Source: Home / Self Care | Attending: Family Medicine

## 2018-01-10 ENCOUNTER — Encounter (HOSPITAL_COMMUNITY): Payer: Self-pay | Admitting: Certified Registered Nurse Anesthetist

## 2018-01-10 DIAGNOSIS — K2970 Gastritis, unspecified, without bleeding: Secondary | ICD-10-CM

## 2018-01-10 DIAGNOSIS — K3189 Other diseases of stomach and duodenum: Secondary | ICD-10-CM

## 2018-01-10 DIAGNOSIS — K259 Gastric ulcer, unspecified as acute or chronic, without hemorrhage or perforation: Secondary | ICD-10-CM

## 2018-01-10 DIAGNOSIS — K269 Duodenal ulcer, unspecified as acute or chronic, without hemorrhage or perforation: Secondary | ICD-10-CM

## 2018-01-10 DIAGNOSIS — K921 Melena: Principal | ICD-10-CM

## 2018-01-10 DIAGNOSIS — E440 Moderate protein-calorie malnutrition: Secondary | ICD-10-CM

## 2018-01-10 HISTORY — PX: BIOPSY: SHX5522

## 2018-01-10 HISTORY — PX: ESOPHAGOGASTRODUODENOSCOPY (EGD) WITH PROPOFOL: SHX5813

## 2018-01-10 LAB — BPAM RBC
Blood Product Expiration Date: 202001222359
Blood Product Expiration Date: 202001222359
ISSUE DATE / TIME: 201912231824
ISSUE DATE / TIME: 201912232123
UNIT TYPE AND RH: 7300
Unit Type and Rh: 7300

## 2018-01-10 LAB — ANA: Anti Nuclear Antibody(ANA): NEGATIVE

## 2018-01-10 LAB — TYPE AND SCREEN
ABO/RH(D): B POS
Antibody Screen: NEGATIVE
Unit division: 0
Unit division: 0

## 2018-01-10 LAB — HEMOGLOBIN AND HEMATOCRIT, BLOOD
HCT: 24.2 % — ABNORMAL LOW (ref 39.0–52.0)
HEMATOCRIT: 25.1 % — AB (ref 39.0–52.0)
HEMOGLOBIN: 7.9 g/dL — AB (ref 13.0–17.0)
Hemoglobin: 7.8 g/dL — ABNORMAL LOW (ref 13.0–17.0)

## 2018-01-10 LAB — BASIC METABOLIC PANEL
Anion gap: 14 (ref 5–15)
BUN: 76 mg/dL — ABNORMAL HIGH (ref 8–23)
CO2: 21 mmol/L — ABNORMAL LOW (ref 22–32)
Calcium: 7.6 mg/dL — ABNORMAL LOW (ref 8.9–10.3)
Chloride: 102 mmol/L (ref 98–111)
Creatinine, Ser: 2.48 mg/dL — ABNORMAL HIGH (ref 0.61–1.24)
GFR, EST AFRICAN AMERICAN: 28 mL/min — AB (ref >60)
GFR, EST NON AFRICAN AMERICAN: 24 mL/min — AB (ref >60)
Glucose, Bld: 103 mg/dL — ABNORMAL HIGH (ref 70–99)
Potassium: 3.3 mmol/L — ABNORMAL LOW (ref 3.5–5.1)
Sodium: 137 mmol/L (ref 135–145)

## 2018-01-10 LAB — PREALBUMIN: Prealbumin: 7.6 mg/dL — ABNORMAL LOW (ref 18–38)

## 2018-01-10 SURGERY — ESOPHAGOGASTRODUODENOSCOPY (EGD) WITH PROPOFOL
Anesthesia: Monitor Anesthesia Care

## 2018-01-10 MED ORDER — PANTOPRAZOLE SODIUM 40 MG PO TBEC
40.0000 mg | DELAYED_RELEASE_TABLET | Freq: Two times a day (BID) | ORAL | Status: DC
  Administered 2018-01-10 – 2018-02-09 (×60): 40 mg via ORAL
  Filled 2018-01-10 (×64): qty 1

## 2018-01-10 MED ORDER — LACTATED RINGERS IV SOLN
INTRAVENOUS | Status: DC | PRN
  Administered 2018-01-10: 14:00:00 via INTRAVENOUS

## 2018-01-10 MED ORDER — SODIUM CHLORIDE 0.9 % IV SOLN
INTRAVENOUS | Status: DC
  Administered 2018-01-10: 14:00:00 via INTRAVENOUS

## 2018-01-10 MED ORDER — PROPOFOL 10 MG/ML IV BOLUS
INTRAVENOUS | Status: DC | PRN
  Administered 2018-01-10: 20 mg via INTRAVENOUS

## 2018-01-10 MED ORDER — BOOST / RESOURCE BREEZE PO LIQD CUSTOM: 1.0000 | Freq: Three times a day (TID) | ORAL | Status: DC

## 2018-01-10 MED ORDER — METOPROLOL SUCCINATE ER 25 MG PO TB24
25.0000 mg | ORAL_TABLET | Freq: Every day | ORAL | Status: DC
  Administered 2018-01-10 – 2018-01-12 (×3): 25 mg via ORAL
  Filled 2018-01-10 (×3): qty 1

## 2018-01-10 MED ORDER — PANTOPRAZOLE SODIUM 40 MG PO TBEC
40.0000 mg | DELAYED_RELEASE_TABLET | Freq: Two times a day (BID) | ORAL | Status: DC
  Administered 2018-01-10: 40 mg via ORAL
  Filled 2018-01-10: qty 1

## 2018-01-10 MED ORDER — TORSEMIDE 20 MG PO TABS
40.0000 mg | ORAL_TABLET | Freq: Every day | ORAL | Status: DC
  Administered 2018-01-10 – 2018-01-12 (×3): 40 mg via ORAL
  Filled 2018-01-10 (×4): qty 2

## 2018-01-10 MED ORDER — NETARSUDIL-LATANOPROST 0.02-0.005 % OP SOLN
1.0000 [drp] | Freq: Every day | OPHTHALMIC | Status: DC
  Administered 2018-01-10 – 2018-02-08 (×31): 1 [drp] via OPHTHALMIC

## 2018-01-10 MED ORDER — AMLODIPINE BESYLATE 2.5 MG PO TABS
2.5000 mg | ORAL_TABLET | Freq: Every day | ORAL | Status: DC
  Administered 2018-01-10 – 2018-01-12 (×3): 2.5 mg via ORAL
  Filled 2018-01-10 (×3): qty 1

## 2018-01-10 MED ORDER — MOMETASONE FURO-FORMOTEROL FUM 100-5 MCG/ACT IN AERO
2.0000 | INHALATION_SPRAY | Freq: Two times a day (BID) | RESPIRATORY_TRACT | Status: DC
  Administered 2018-01-10 – 2018-02-09 (×45): 2 via RESPIRATORY_TRACT
  Filled 2018-01-10 (×3): qty 8.8

## 2018-01-10 MED ORDER — PROPOFOL 500 MG/50ML IV EMUL
INTRAVENOUS | Status: DC | PRN
  Administered 2018-01-10: 60 ug/kg/min via INTRAVENOUS

## 2018-01-10 MED ORDER — PANTOPRAZOLE SODIUM 40 MG IV SOLR
40.0000 mg | Freq: Two times a day (BID) | INTRAVENOUS | Status: DC
  Administered 2018-01-10: 40 mg via INTRAVENOUS
  Filled 2018-01-10: qty 40

## 2018-01-10 SURGICAL SUPPLY — 15 items

## 2018-01-10 NOTE — Progress Notes (Signed)
Initial Nutrition Assessment  DOCUMENTATION CODES:   Non-severe (moderate) malnutrition in context of chronic illness  INTERVENTION:   - Once diet advanced, Ensure Enlive po BID, each supplement provides 350 kcal and 20 grams of protein (vanilla flavor)  NUTRITION DIAGNOSIS:   Moderate Malnutrition related to chronic illness (PUD, CHF, NSCLC of right lung) as evidenced by mild fat depletion, moderate fat depletion, mild muscle depletion, moderate muscle depletion.  GOAL:   Patient will meet greater than or equal to 90% of their needs  MONITOR:   Diet advancement, Labs, Weight trends, Supplement acceptance, I & O's  REASON FOR ASSESSMENT:   Malnutrition Screening Tool    ASSESSMENT:   77 year old male who presented to the ED on 12/23 with low hemoglobin. Pt with recent admission for GI bleed related to PUD. PMH significant for CHF, HTN, HLD, NSCLC of right lung.  Plan is for pt to be NPO until EGD, likely this afternoon. Discussed with RN.  Spoke with pt and wife at bedside. Pt reports having a decreased appetite for several weeks. Pt's wife believes that this is related to pt's new medication for PUD because he started eating less soon after he started taking this medication. Pt's wife unsure of name of medication.  Pt's wife reports that pt typically consumes 1-2 meals daily which is normal for him but that his portion sizes and quantity of food has decreased. For example, pt used to eat a couple of eggs, toast, and sausage for breakfast but now eats only 1 egg. Pt's wife shares that pt's dinner portions are 2/3 of what they used to be.  Pt and family share that they have noticed pt losing muscle mass. Pt reports his UBW as "around 200 lbs" but that his weight fluctuates related to fluid status. Per weight history in chart, pt's weight has fluctuated between 88-99 kg over the past 1 year. Current weight is 90.7 kg. Noted pitting edema to BLE. Suspect fluid is masking weight  loss.  Pt denies any issues chewing or swallowing but endorses diarrhea that he states he found out was "hemorrhaging." Pt reports taking Miralax PTA.  Pt reports typically drinking 2 oral nutrition supplements daily at home and would like to receive vanilla Ensure Enlive once diet advanced.  Medications reviewed and include: Protonix  Labs reviewed: potassium 3.3 (L), BUN 76 (H), creatinine 2.48 (H), hemoglobin 7.8 (L), HCT 24.2 (L)  NUTRITION - FOCUSED PHYSICAL EXAM:    Most Recent Value  Orbital Region  Moderate depletion  Upper Arm Region  Moderate depletion  Thoracic and Lumbar Region  Moderate depletion  Buccal Region  Mild depletion  Temple Region  Moderate depletion  Clavicle Bone Region  Moderate depletion  Clavicle and Acromion Bone Region  Moderate depletion  Scapular Bone Region  Moderate depletion  Dorsal Hand  Mild depletion  Patellar Region  Unable to assess [significant edema]  Anterior Thigh Region  Unable to assess  Posterior Calf Region  Unable to assess  Edema (RD Assessment)  Moderate [BLE]  Hair  Reviewed  Eyes  Reviewed  Mouth  Reviewed  Skin  Reviewed  Nails  Reviewed       Diet Order:   Diet Order            Diet NPO time specified  Diet effective now              EDUCATION NEEDS:   Education needs have been addressed  Skin:  Skin Assessment: Reviewed RN Assessment  Last BM:  12/23  Height:   Ht Readings from Last 1 Encounters:  01/09/18 5' 10.5" (1.791 m)    Weight:   Wt Readings from Last 1 Encounters:  01/09/18 90.7 kg    Ideal Body Weight:  76.8 kg  BMI:  Body mass index is 28.29 kg/m.  Estimated Nutritional Needs:   Kcal:  2000-2200  Protein:  100-115 grams  Fluid:  >/= 2.0 L    Gaynell Face, MS, RD, LDN Inpatient Clinical Dietitian Pager: [phone] Weekend/After Hours: [phone]

## 2018-01-10 NOTE — Progress Notes (Addendum)
Pt admitted to 5w19. Pt a/o X 4, showing no signs of distress. Blood being administered upon admission to the unit. Skin check completed with Candace RN, skin noted to be dry and flaky, old healed sore noted to pt's left eye., no other skin issues noted. RN to continue to monitor.

## 2018-01-10 NOTE — Progress Notes (Signed)
PROGRESS NOTE    Patient: Andrew Guerra                            PCP: [name], MD                    DOB: 1941/01/04            DOA: 01/09/2018 ERX:[id number]             DOS: 01/10/2018, 8:16 AM   LOS: 1 day   Date of Service: The patient was seen and examined on 01/10/2018  Subjective:   Patient was seen and examined.  Stable.  Status post 2 units of packed red blood cell transfusion.  Still n.p.o. No further episodes of melena or frank rectal bleed overnight.  GI consulted.  Brief Narrative:   Andrew Guerra is a 77 y.o. male with medical history significant of Mod AS, NSCLC of R lung, PUD diagnosed on admission in Oct, non-bleeding at that time on EGD.  Patient presents to the ED after PCP found HGB 6.5 today.  He had gone in to his PCPs office today due to SOB over the past few days, dry cough, increased peripheral edema.  He reports one episode of black stools today.  On admission brown stool but hemoccult positive, HGB 6.4.  CXR shows increasing B pleural effusions.  Patient given 40 IV lasix, 2u PRBC transfusion ordered.  Patient was kept n.p.o. [facility] was consulted.  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  On October had a EGD was significant for PUD, has been on PPI -At this admission patient only reports of melena, no frank bleeding -We will change Protonix 40 mg IV twice daily -Hemoglobin noted to 6.4, currently stable >> being transfused with 2 units of packed red blood cells -Monitoring H&H closely -Patient will be kept n.p.o., -Case was discussed in detail with GI - [facility] was consulted ---> appreciate further evaluation and recommendations    Acute  on chronic diastolic CHF - acute component likely due to anemia - 60mg  IV lasix given in ED before first transfusion -We will continue to monitor on telemetry bed -Once stable, will resume diuretics -home medication of torsemide 40 mg p.o. daily -Transfuse slowly   HTN -Currently stable not hypotensive due to anemia -Restarting home medication accordingly including Norvasc, metoprolol, torsemide   NSCLC - 1. Looks like patient had this surgically resected, then had recurrence followed by SBRT with shrinking of the recurrent tumor according to oncology office visit notes. 2. Dont see any mets in abd on CT today. 3. Kidney lesions on CT today but favored to be benign on 11/22 MRI   DVT prophylaxis: SCD/Compression stockings  Code Status:   Code Status: Full Code  Disposition Plan: Home after admit  Admission status: Admit to inpatient  Family Communication:  The above findings and plan of care has been discussed with patient and family in detail, they expressed understanding and agreement of above.  Disposition Plan:   Anticipated 1-2 days  Consultants: GI   Procedures:     EGD? Colon.[name]  Antimicrobials:  Anti-infectives (From admission, onward)   None       Medication:  . amLODipine  2.5 mg Oral Daily  . feeding supplement  1 Container Oral TID BM  . metoprolol succinate  25 mg Oral Daily  . mometasone-formoterol  2 puff Inhalation BID  . Netarsudil-Latanoprost  1 drop Both Eyes QHS  . pantoprazole  40 mg Oral BID  . torsemide  40 mg Oral Daily    acetaminophen **OR** acetaminophen, ondansetron **OR** ondansetron (ZOFRAN) IV     Objective:   Vitals:   01/10/18 0000 01/10/18 0045 01/10/18 0114 01/10/18 0500  BP: 140/69 (!) 148/73 127/60 (!) 114/59  Pulse: 81 79 89 80  Resp: (!) 28  18 18   Temp:   97.8 F (36.6 C) 98 F (36.7 C)  TempSrc:    Oral  SpO2: 95%  93% 93%  Weight:      Height:        Intake/Output Summary (Last 24 hours) at  01/10/2018 0816 Last data filed at 01/10/2018 8786 Gross per 24 hour  Intake 418 ml  Output 825 ml  Net -407 ml   Filed Weights   01/09/18 1633  Weight: 90.7 kg     Examination:    General exam: Appears calm and comfortable  BP (!) 114/59 (BP Location: Right Arm)   Pulse 80   Temp 98 F (36.7 C) (Oral)   Resp 18   Ht 5' 10.5" (1.791 m)   Wt 90.7 kg   SpO2 93%   BMI 28.29 kg/m    Physical Exam  Constitution:  Alert, cooperative, no distress,  Psychiatric: Normal and stable mood and affect, cognition intact,   HEENT: Normocephalic, PERRL, otherwise with in Normal limits  Chest:Chest symmetric Cardio vascular:  S1/S2, RRR, No murmure, No Rubs or Gallops  pulmonary: Clear to auscultation bilaterally, respirations unlabored, negative wheezes / crackles Abdomen: Soft, non-tender, non-distended, bowel sounds,no masses, no organomegaly Muscular skeletal: Limited exam - in bed, able to move all 4 extremities, Normal strength,  Neuro: CNII-XII intact. , normal motor and sensation, reflexes intact  Extremities: No pitting edema lower extremities, +2 pulses  Skin: Dry, warm to touch, negative for any Rashes, No open wounds Wounds: per nursing documentation  LABs:  CBC Latest Ref Rng & Units 01/10/2018 01/09/2018 01/09/2018  WBC 4.0 - 10.5 K/uL - 22.2(H) 22.0(H)  Hemoglobin 13.0 - 17.0 g/dL 7.9(L) 6.4(LL) 6.4(LL)  Hematocrit 39.0 - 52.0 % 25.1(L) 21.7(L) 21.1(L)  Platelets 150 - 400 K/uL - 387 361   CMP Latest Ref Rng & Units 01/10/2018 01/09/2018 01/09/2018  Glucose 70 - 99 mg/dL 103(H) 106(H) 107(H)  BUN 8 - 23 mg/dL 76(H) 76(H) 78(H)  Creatinine 0.61 - 1.24 mg/dL 2.48(H) 2.72(H) 2.60(H)  Sodium 135 - 145 mmol/L 137 138 136  Potassium 3.5 - 5.1 mmol/L 3.3(L) 3.6 3.0(L)  Chloride 98 - 111 mmol/L 102 102 104  CO2 22 - 32 mmol/L 21(L) 21(L) 21(L)  Calcium 8.9 - 10.3 mg/dL 7.6(L) 7.6(L) 7.5(L)  Total Protein 6.5 - 8.1 g/dL - 4.7(L) 4.9(L)  Total Bilirubin 0.3 - 1.2 mg/dL -  0.4 0.4  Alkaline Phos 38 - 126 U/L - 82 80  AST 15 - 41 U/L - 56(H) 54(H)  ALT 0 - 44 U/L - 33 30

## 2018-01-10 NOTE — Consult Note (Addendum)
[facility] Consult: 9:24 AM 01/10/2018  LOS: 1 day    Referring Provider: Dr Roger Shelter   Primary Care Physician:  [name], MD Primary Gastroenterologist:  Dr. Henrene Pastor    Reason for Consultation:  Recurrent tarry stool and acute on chronic anemia.     HPI: Andrew Guerra is a 77 y.o. male.  PMH  Liver cysts.  Renal cysts.  CKD 3.  Diastolic CHF.  Pretension.  Lung cancer treated with RUL resection 12/2015 and radiation.  LVEF 60 -95%, grade 1 diastolic dysfunction, aortic valve calcification on 10/24/2017 echocardiogram.  Benign thyroid nodule 01/2014.  Heart murmur, aortic stenosis, history rheumatic fever.  Sickle cell trait.  Glaucoma.  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   Colonoscopies in 2006, 2011, 03/2015 3 small, hyperplastic polyps 2006 Diminutive, mild serrated, polyp and hypertrophied anal papillae 2011 Colonoscopy in 03/2015 normal.  Admission 10/2017 with symptomatic anemia, Hgb as low as 7.4, tarry stool. 11/11/17 EGD by Dr. Bryan Lemma revealed nonbleeding gastric ulcers without bleeding stigmata.  Multiple, nonbleeding duodenal ulcers  also without bleeding stigmata.  A duodenal nodule  was also biopsied, ?prominent minor papilla versus accessory duct? Pathology: 1. Duodenum, Biopsy, nodule - BENIGN DUODENAL MUCOSA - NO ACUTE INFLAMMATION, VILLOUS BLUNTING OR INCREASED INTRAEPITHELIAL LYMPHOCYTES 2. Duodenum, Biopsy - BENIGN DUODENAL MUCOSA - NO ACUTE INFLAMMATION, VILLOUS BLUNTING OR INCREASED INTRAEPITHELIAL LYMPHOCYTES 3. Stomach, biopsy - CHRONIC ACTIVE GASTRITIS WITH INTESTINAL METAPLASIA - H. PYLORI ORGANISMS PRESENT  Patient was contacted and started on bismuth, metronidazole, doxycycline along with twice daily PPI for 14 days.  Thereafter he was was told to continue Protonix  40 twice daily for at least 8 weeks.  Also plans for him to have repeat EGD to ensure healing of the ulcer and possibly MRI or EUS for further evaluation of the duodenal nodule. Finished H Pylori tx, still on PPI.  Stools dark with the bismuth, never turned brown after finishing this.  Last 3 days intestinal rumbling, tarry stool.  No nausea.  Appetite, PO intake diminished but no nausea.   Chronic stable DOE.  3 weeks on increased LE edema and new scrotal edema.  Waist circumference stable.   MD had referred pt to hematology but no date set yet.  No date set for GI visits, procedures.    Hgb was 9.4 on 10/27, he had received 2 U PRBCs.  Patient has had recurrent anemia since discharge with readings down to the 7's beginning early 11/2017.  7.9 on 12/11.  He did not receive any outpatient transfusions Yesterday it was 6.2.  7.9 following 2 UPRBCs.  MCV 77. FOBT positive. Albumin is 1.6.  AST 56 otherwise normal LFTs.   Consistently renal insufficiency consistent with stage 3 CKD, currently AKI stage 4 measures.  Noncontrast CT abdomen and pelvis 12/23 shows pulmonary consolidation/atelectasis in the RML with small bilateral effusions.  Scattered cysts within the kidney.  Hepatic, splenic granuloma.  Diffuse soft tissue anasarca.  Degenerative lumbar spine disease.     Past Medical History:  Diagnosis Date  . Aortic stenosis  due to bicuspid aortic valve 02/08/2017   moderate by echo 10/2017 with mean AVG 28mmHg and dimensionless index 0.31 consistent with moderate AS.  [name] Cataract    right eye - surgery to remove  . Elevated PSA 11/29/2016   Patient is followed by [facility].  Most recent PSA near 11.  He has had atypia on biopsy.  November 2008  . Essential hypertension, benign 06/08/2012  . Full dentures   . Glaucoma   . Heart murmur    since childhood, never has caused any problems  . History of rheumatic fever 11/04/2015  . Hyperlipidemia 04/25/2013  . Hypertension   . Malignant  neoplasm of left lung ([place]) 02/08/2017  . RBBB 07/12/2017  . Sickle cell trait ([place])    no problems per patient    Past Surgical History:  Procedure Laterality Date  . BIOPSY  11/11/2017   Procedure: BIOPSY;  Surgeon: Lavena Bullion, DO;  Location: [facility];  Service: Gastroenterology;;  . cataract eye surgery Right   . COLONOSCOPY  11/2009   hx polyps/[place]  . ESOPHAGOGASTRODUODENOSCOPY N/A 11/11/2017   Procedure: ESOPHAGOGASTRODUODENOSCOPY (EGD);  Surgeon: Lavena Bullion, DO;  Location: [facility] END[facility]COPY;  Service: Gastroenterology;  Laterality: N/A;  . PROSTATE BIOPSY    . right eye surgery     to lower eye pressure  . TONSILLECTOMY    . VIDEO ASSISTED THORACOSCOPY (VATS)/ LOBECTOMY Right 12/26/2015  . wisdom tteeth ext      Prior to Admission medications   Medication Sig Start Date End Date Taking? Authorizing Provider  amLODipine (NORVASC) 2.5 MG tablet Take 1 tablet (2.5 mg total) by mouth daily. 11/07/17  Yes [name], MD  aspirin EC 81 MG tablet Take 81 mg by mouth daily.   Yes [provider]  Brinzolamide-Brimonidine (SIMBRINZA) 1-0.2 % SUSP Place 2-3 drops into both eyes 3 (three) times daily.    Yes [provider]  budesonide-formoterol (SYMBICORT) 80-4.5 MCG/ACT inhaler Inhale 2 puffs into the lungs 2 (two) times daily. 11/01/17  Yes [name], MD  metoprolol succinate (TOPROL-XL) 50 MG 24 hr tablet Take 1/2 tablet daily.Take with or immediately following a meal. 06/23/17  Yes Luking, Elayne Snare, MD  Multiple Vitamin (MULTIVITAMIN) tablet Take 1 tablet by mouth daily.   Yes [provider]  Netarsudil-Latanoprost (ROCKLATAN) 0.02-0.005 % SOLN Place 1 drop into both eyes at bedtime.    Yes Marylynn Pearson, MD  pantoprazole (PROTONIX) 40 MG tablet Take 1 tablet (40 mg total) by mouth 2 (two) times daily. 11/30/17 01/29/18 Yes Willia Craze, NP  ketoconazole (NIZORAL) 2 % cream Apply to area twice daily Patient not taking: Reported  on 01/09/2018 01/03/18   [name], MD  torsemide [facility]) 20 MG tablet Take two tablets by mouth every morning Patient not taking: Reported on 01/09/2018 01/09/18   [name], MD    Scheduled Meds: . amLODipine  2.5 mg Oral Daily  . feeding supplement  1 Container Oral TID BM  . metoprolol succinate  25 mg Oral Daily  . mometasone-formoterol  2 puff Inhalation BID  . Netarsudil-Latanoprost  1 drop Both Eyes QHS  . pantoprazole (PROTONIX) IV  40 mg Intravenous Q12H  . torsemide  40 mg Oral Daily   Infusions:  PRN Meds: acetaminophen **OR** acetaminophen, ondansetron **OR** ondansetron (ZOFRAN) IV   Allergies as of 01/09/2018 - Review Complete 01/09/2018  Allergen Reaction Noted  . Atorvastatin  07/31/2017    Family History  Problem  Relation Age of Onset  . Diabetes Mother   . Cerebral aneurysm Mother   . Lung cancer Father   . Diabetes Sister   . Colon cancer Neg Hx   . Colon polyps Neg Hx   . Esophageal cancer Neg Hx   . Rectal cancer Neg Hx   . Stomach cancer Neg Hx     Social History   Socioeconomic History  . Marital status: Married    Spouse name: Not on file  . Number of children: Not on file  . Years of education: Not on file  . Highest education level: Not on file  Occupational History  . Not on file  Social Needs  . Financial resource strain: Not on file  . Food insecurity:    Worry: Not on file    Inability: Not on file  . Transportation needs:    Medical: Not on file    Non-medical: Not on file  Tobacco Use  . Smoking status: Former Smoker    Packs/day: 1.00    Types: Cigarettes    Last attempt to quit: 01/19/2011    Years since quitting: 6.9  . Smokeless tobacco: Never Used  Substance and Sexual Activity  . Alcohol use: Yes    Alcohol/week: 1.0 standard drinks    Types: 1 Shots of liquor per week  . Drug use: No  . Sexual activity: Not on file  Lifestyle  . Physical activity:    Days per week: Not on file    Minutes per  session: Not on file  . Stress: Not on file  Relationships  . Social connections:    Talks on phone: Not on file    Gets together: Not on file    Attends religious service: Not on file    Active member of club or organization: Not on file    Attends meetings of clubs or organizations: Not on file    Relationship status: Not on file  . Intimate partner violence:    Fear of current or ex partner: Not on file    Emotionally abused: Not on file    Physically abused: Not on file    Forced sexual activity: Not on file  Other Topics Concern  . Not on file  Social History Narrative  . Not on file    REVIEW OF SYSTEMS: Constitutional: Fatigue. ENT:  No nose bleeds Pulm: Stable DOE.  Cough. CV:  No palpitations, no chest pain.  Increased lower extremity edema. GU:  No hematuria, no frequency GI: See HPI. Heme: Other than the dark and/or tarry stools, has not had any unusual or excessive bleeding/bruising. Transfusions: See HPI. Neuro:  No headaches, no peripheral tingling or numbness.  No dizziness, no syncope. Derm:  No itching, no rash or sores.  Endocrine:  No sweats or chills.  No polyuria or dysuria Immunization: And ask him if he had a current flu shot. Travel:  None beyond local counties in last few months.    PHYSICAL EXAM: Vital signs in last 24 hours: Vitals:   01/10/18 0114 01/10/18 0500  BP: 127/60 (!) 114/59  Pulse: 89 80  Resp: 18 18  Temp: 97.8 F (36.6 C) 98 F (36.7 C)  SpO2: 93% 93%   Wt Readings from Last 3 Encounters:  01/09/18 90.7 kg  01/09/18 99.7 kg  12/14/17 88.5 kg    General: Elderly, somewhat frail though not cachectic looking AAM.  Comfortable.  Looks chronically ill. Head: No facial asymmetry or swelling.  No  signs of head trauma. Eyes: No scleral icterus.  No conjunctival pallor. Ears: Not hard of hearing Nose: No discharge Mouth: Pink, moist oral mucosa.  Nonspecific white coating on the margin of the tongue.  Tongue midline. Neck: No  mass, no JVD. Lungs: Crackles in the bases more prominent on the left.  Dyspneic when he speaks long sentences. Heart: RRR.  No MRG.  S1, S2 present Abdomen: Obese.  Soft.  Not tender, not distended.  Active bowel sounds.  No organomegaly, bruits, masses..   Rectal: Deferred rectal exam.  Stool FOBT positive in the lab. GU: Scrotal edema Musc/Skeltl: No gross joint deformities or swelling Extremities: Edema in the upper and lower legs with woody changes in the lower legs.  3+ pitting. Neurologic: Alert.  Oriented x3.  Moves all 4 limbs, strength not tested.  No tremor.  Speech pattern somewhat slow but precise. Skin: Woody, dry skin changes in the lower legs. Tattoos: None Nodes: No cervical adenopathy. Psych: Pleasant, cooperative, calm.  Intake/Output from previous day: 12/23 0701 - 12/24 0700 In: 418 [Blood:418] Out: 825 [Urine:825] Intake/Output this shift: No intake/output data recorded.  LAB RESULTS: Recent Labs    01/09/18 1204 01/09/18 1651 01/10/18 0406  WBC 22.0* 22.2*  --   HGB 6.4* 6.4* 7.9*  HCT 21.1* 21.7* 25.1*  PLT 361 387  --    BMET Lab Results  Component Value Date   NA 137 01/10/2018   NA 138 01/09/2018   NA 136 01/09/2018   K 3.3 (L) 01/10/2018   K 3.6 01/09/2018   K 3.0 (L) 01/09/2018   CL 102 01/10/2018   CL 102 01/09/2018   CL 104 01/09/2018   CO2 21 (L) 01/10/2018   CO2 21 (L) 01/09/2018   CO2 21 (L) 01/09/2018   GLUCOSE 103 (H) 01/10/2018   GLUCOSE 106 (H) 01/09/2018   GLUCOSE 107 (H) 01/09/2018   BUN 76 (H) 01/10/2018   BUN 76 (H) 01/09/2018   BUN 78 (H) 01/09/2018   CREATININE 2.48 (H) 01/10/2018   CREATININE 2.72 (H) 01/09/2018   CREATININE 2.60 (H) 01/09/2018   CALCIUM 7.6 (L) 01/10/2018   CALCIUM 7.6 (L) 01/09/2018   CALCIUM 7.5 (L) 01/09/2018   LFT Recent Labs    01/09/18 1204 01/09/18 1651  PROT 4.9* 4.7*  ALBUMIN 1.8* 1.6*  AST 54* 56*  ALT 30 33  ALKPHOS 80 82  BILITOT 0.4 0.4  BILIDIR 0.1  --   IBILI 0.3  --      PT/INR Lab Results  Component Value Date   INR 1.21 01/09/2018   INR 1.09 11/09/2017   Hepatitis Panel No results for input(s): HEPBSAG, HCVAB, HEPAIGM, HEPBIGM in the last 72 hours. C-Diff No components found for: CDIFF Lipase     Component Value Date/Time   LIPASE 48 01/09/2018 1651    Drugs of Abuse  No results found for: LABOPIA, COCAINSCRNUR, LABBENZ, AMPHETMU, THCU, LABBARB   RADIOLOGY STUDIES: Ct Abdomen Pelvis Wo Contrast  Result Date: 01/09/2018 CLINICAL DATA:  Low hemoglobin. EXAM: CT ABDOMEN AND PELVIS WITHOUT CONTRAST TECHNIQUE: Multidetector CT imaging of the abdomen and pelvis was performed following the standard protocol without IV contrast. COMPARISON:  CT 02/04/2015, abdomen MRI 12/09/2017 FINDINGS: Lower chest: Small bilateral pleural effusions. Cardiomegaly without pericardial effusion or thickening. Atherosclerosis of the included thoracic aorta without aneurysm. Coronary arteriosclerosis is seen. Pulmonary consolidation/atelectasis in the right middle lobe distribution. Hepatobiliary: Layering biliary sludge within the gallbladder without secondary signs of acute cholecystitis. The unenhanced liver  demonstrates a granuloma in the right hepatic lobe. No definite mass or biliary dilatation. Pancreas: Unremarkable. No pancreatic ductal dilatation or surrounding inflammatory changes. Spleen: Normal size spleen with scattered calcifications noted within possibly representing vascular calcifications or granulomata. Adrenals/Urinary Tract: Scattered bilateral hyperdense lesions of the kidneys are nonspecific but may represent proteinaceous or hemorrhagic cysts, the largest is right-sided and interpolar measuring up 1.6 cm with more ill-defined hyperdensity slightly exophytic off the right kidney. No nephrolithiasis or obstructive uropathy. The urinary bladder is unremarkable for the degree of distention. Stomach/Bowel: Stomach is within normal limits. The appendix is not  confidently identified. No evidence of bowel wall thickening, distention, or inflammatory changes. Vascular/Lymphatic: Aortic atherosclerosis. No lymphadenopathy. Reproductive: Prostatomegaly measuring up to 5.7 cm. Other: Diffuse soft tissue anasarca. No ascites or free air. 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 IMPRESSION: 1. Pulmonary consolidation/atelectasis in the right middle lobe with small bilateral pleural effusions. 2. Scattered bilateral hyperdense lesions of the kidneys likely to represent proteinaceous or hemorrhagic cysts, the largest is in the right interpolar and interpolar measuring 1.6 cm with more ill-defined hyperdensity slightly exophytic off the right kidney. Findings better characterized on recent prior MRI. 3. Hepatic and splenic granulomata. 4. Diffuse soft tissue anasarca. 5. 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 Aortic Atherosclerosis (ICD10-I70.0). Electronically Signed   By: Ashley Royalty M.D.   On: 01/09/2018 20:33   Dg Chest Portable 1 View  Result Date: 01/09/2018 CLINICAL DATA:  Shortness of breath for 1 day. 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 EXAM: PORTABLE CHEST 1 VIEW COMPARISON:  Radiographs 11/09/2017. Abdominal MRI 12/09/2017. PET-CT 11/25/2015. FINDINGS: 1902 hours. There are right-greater-than-left pleural effusions which have enlarged. There is associated bibasilar pulmonary opacity, likely atelectasis. There are stable postsurgical changes in the right hilar region. No pneumothorax or acute osseous findings are seen. IMPRESSION: Enlarging right-greater-than-left pleural effusions with associated bibasilar pulmonary opacities, likely atelectasis. No invert pulmonary edema. Electronically Signed   By: Richardean Sale M.D.   On: 01/09/2018 19:35     IMPRESSION:   *    Acute on chronic edema.  By his story it is not significantly symptomatic as his DOE is stable.  Received 2 U PRBCs thus far  *    Recent GI bleed and anemia  from duodenal and gastric ulcers.  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  Antibiotic therapy for H pylori, continues on twice daily Protonix.  *    Benign duodenal nodule  *    Anasarca, increased LE edema and new scrotal edema.  Low albumin.    *     Acute on chronic kidney disease.  This may be contributing to the anemia.    PLAN:     *    Repeat EGD, depending on endoscopy unit schedule may be able to complete this this afternoon.    Keep n.p.o. for this though he can take oral meds if needed.  *    Prealbumin level.    *    Leaving twice daily IV Protonix in place.  *     Serial hemoglobins.   Azucena Freed  01/10/2018, 9:24 AM Phone [phone]

## 2018-01-10 NOTE — Op Note (Addendum)
[facility] Patient Name: Andrew Guerra Procedure Date : 01/10/2018 MRN: [id number] Attending MD: [name] MD, MD Date of Birth: [birth year] CSN: [id number] Age: 77 Admit Type: Inpatient Procedure:                Upper GI endoscopy Indications:              Melena. EGD 10/2017 with gastric and duodenal                            ulcers. H pylori positive. Now with acute on                            chronic anemia and recurrent melena. Providers:                [name] MD, MD, Vista Lawman, RN, Elspeth Cho Tech., Technician, Rejeana Brock, CRNA Referring MD:              Medicines:                See the Anesthesia note for documentation of the                            administered medications Complications:            No immediate complications. Estimated blood loss:                            Minimal. Estimated Blood Loss:     Estimated blood loss was minimal. Procedure:                Pre-Anesthesia Assessment:                           - Prior to the procedure, a History and Physical                            was performed, and patient medications and                            allergies were reviewed. The patient's tolerance of                            previous anesthesia was also reviewed. The risks                            and benefits of the procedure and the sedation                            options and risks were discussed with the patient.                            All questions were answered, and informed consent  was obtained. Prior Anticoagulants: The patient has                            taken no previous anticoagulant or antiplatelet                            agents. ASA Grade Assessment: III - A patient with                            severe systemic disease. After reviewing the risks                            and benefits, the patient was deemed in      satisfactory condition to undergo the procedure.                           After obtaining informed consent, the endoscope was                            passed under direct vision. Throughout the                            procedure, the patient's blood pressure, pulse, and                            oxygen saturations were monitored continuously. The                            GIF-H190 ([id number]) Olympus Adult EGD was introduced                            through the mouth, and advanced to the third part                            of duodenum. The upper GI endoscopy was                            accomplished without difficulty. The patient                            tolerated the procedure well. Scope In: Scope Out: Findings:      LA Class A reflux esophagitis.      Few non-bleeding cratered gastric ulcers with no stigmata of bleeding       were found in the gastric body. The largest lesion was 3 mm in largest       dimension. Biopsies were taken with a cold forceps for histology.      Recent diagnosis of intestinal metaplasia noted. The gastric mucosa has       a diffuse, sublte verrucous appearance. Several biopsies were obtained       with cold forceps for histology separating the antrum biopsies from the       body and fundus biopsies for mapping. Estimated blood loss was minimal.       Biopsies were also obtained from the antrum and body  to follow-up on his       H pylori treatment. No blood was present in the stomach.      Many non-bleeding cratered duodenal ulcers with no stigmata of bleeding       were found in the duodenal bulb. The largest lesion was 3 mm in largest       dimension. Biopsies were taken with a cold forceps for histology.       Estimated blood loss was minimal.      The exam was otherwise without abnormality. No blood was present in the       duodenum. Impression:               - LA Class A reflux esophagitis.                           - Non-bleeding  gastric ulcers with no stigmata of                            bleeding. Biopsied.                           - Recent diagnosis of intestinal metaplasia and H                            pylori. Biopsies obtained for mapping and to                            document response to therapy, respectively.                           - Multiple non-bleeding duodenal ulcers with no                            stigmata of bleeding. Biopsied.                           - The examination was otherwise normal.                           - Several biopsies were obtained.                           - Overall, the findings in the stomach and duodenum                            are largely improved from October 2019. Recommendation:           - Return patient to hospital ward for ongoing care.                           - Advance diet as tolerated.                           - Continue present medications with PPI BID.                           - Await pathology results.                           -  Avoid all NSAIDs. Procedure Code(s):        --- Professional ---                           [phone], Esophagogastroduodenoscopy, flexible,                            transoral; with biopsy, single or multiple Diagnosis Code(s):        --- Professional ---                           K25.9, Gastric ulcer, unspecified as acute or                            chronic, without hemorrhage or perforation                           K29.70, Gastritis, unspecified, without bleeding                           K26.9, Duodenal ulcer, unspecified as acute or                            chronic, without hemorrhage or perforation                           K92.1, Melena (includes Hematochezia) CPT copyright 2018 American Medical Association. All rights reserved. The codes documented in this report are preliminary and upon coder review may  be revised to meet current compliance requirements. [name] MD, MD 01/10/2018 3:12:43 PM This  report has been signed electronically. Number of Addenda: 0

## 2018-01-10 NOTE — Progress Notes (Signed)
Blood admin complete @ 0059. Pt a/o X 4, showing no signs of distress. Blood administration (Green sheet) not attached to blood upon arrival to the unit. Post blood administration vital completed per protocol. RN to continue to monitor.

## 2018-01-10 NOTE — Anesthesia Postprocedure Evaluation (Signed)
Anesthesia Post Note  Patient: Andrew Guerra  Procedure(s) Performed: ESOPHAGOGASTRODUODENOSCOPY (EGD) WITH PROPOFOL (N/A ) BIOPSY     Patient location during evaluation: PACU Anesthesia Type: MAC Level of consciousness: awake and alert Pain management: pain level controlled Vital Signs Assessment: post-procedure vital signs reviewed and stable Respiratory status: spontaneous breathing, nonlabored ventilation, respiratory function stable and patient connected to nasal cannula oxygen Cardiovascular status: stable and blood pressure returned to baseline Postop Assessment: no apparent nausea or vomiting Anesthetic complications: no    Last Vitals:  Vitals:   01/10/18 1515 01/10/18 1525  BP: (!) 110/48 135/73  Pulse: 84 84  Resp: 16 (!) 23  Temp:    SpO2: 95% 95%    Last Pain:  Vitals:   01/10/18 1515  TempSrc:   PainSc: 0-No pain                 [name]

## 2018-01-10 NOTE — H&P (View-Only) (Signed)
[facility] Consult: 9:24 AM 01/10/2018  LOS: 1 day    Referring Provider: Dr Roger Shelter   Primary Care Physician:  [name], MD Primary Gastroenterologist:  Dr. Henrene Pastor    Reason for Consultation:  Recurrent tarry stool and acute on chronic anemia.     HPI: SAMEL BRUNA is a 77 y.o. male.  PMH  Liver cysts.  Renal cysts.  CKD 3.  Diastolic CHF.  Pretension.  Lung cancer treated with RUL resection 12/2015 and radiation.  LVEF 60 -22%, grade 1 diastolic dysfunction, aortic valve calcification on 10/24/2017 echocardiogram.  Benign thyroid nodule 01/2014.  Heart murmur, aortic stenosis, history rheumatic fever.  Sickle cell trait.  Glaucoma.  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   Colonoscopies in 2006, 2011, 03/2015 3 small, hyperplastic polyps 2006 Diminutive, mild serrated, polyp and hypertrophied anal papillae 2011 Colonoscopy in 03/2015 normal.  Admission 10/2017 with symptomatic anemia, Hgb as low as 7.4, tarry stool. 11/11/17 EGD by Dr. Bryan Lemma revealed nonbleeding gastric ulcers without bleeding stigmata.  Multiple, nonbleeding duodenal ulcers  also without bleeding stigmata.  A duodenal nodule  was also biopsied, ?prominent minor papilla versus accessory duct? Pathology: 1. Duodenum, Biopsy, nodule - BENIGN DUODENAL MUCOSA - NO ACUTE INFLAMMATION, VILLOUS BLUNTING OR INCREASED INTRAEPITHELIAL LYMPHOCYTES 2. Duodenum, Biopsy - BENIGN DUODENAL MUCOSA - NO ACUTE INFLAMMATION, VILLOUS BLUNTING OR INCREASED INTRAEPITHELIAL LYMPHOCYTES 3. Stomach, biopsy - CHRONIC ACTIVE GASTRITIS WITH INTESTINAL METAPLASIA - H. PYLORI ORGANISMS PRESENT  Patient was contacted and started on bismuth, metronidazole, doxycycline along with twice daily PPI for 14 days.  Thereafter he was was told to continue Protonix  40 twice daily for at least 8 weeks.  Also plans for him to have repeat EGD to ensure healing of the ulcer and possibly MRI or EUS for further evaluation of the duodenal nodule. Finished H Pylori tx, still on PPI.  Stools dark with the bismuth, never turned brown after finishing this.  Last 3 days intestinal rumbling, tarry stool.  No nausea.  Appetite, PO intake diminished but no nausea.   Chronic stable DOE.  3 weeks on increased LE edema and new scrotal edema.  Waist circumference stable.   MD had referred pt to hematology but no date set yet.  No date set for GI visits, procedures.    Hgb was 9.4 on 10/27, he had received 2 U PRBCs.  Patient has had recurrent anemia since discharge with readings down to the 7's beginning early 11/2017.  7.9 on 12/11.  He did not receive any outpatient transfusions Yesterday it was 6.2.  7.9 following 2 UPRBCs.  MCV 77. FOBT positive. Albumin is 1.6.  AST 56 otherwise normal LFTs.   Consistently renal insufficiency consistent with stage 3 CKD, currently AKI stage 4 measures.  Noncontrast CT abdomen and pelvis 12/23 shows pulmonary consolidation/atelectasis in the RML with small bilateral effusions.  Scattered cysts within the kidney.  Hepatic, splenic granuloma.  Diffuse soft tissue anasarca.  Degenerative lumbar spine disease.     Past Medical History:  Diagnosis Date  . 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  [name] Cataract    right eye - surgery to remove  . Elevated PSA 11/29/2016   Patient is followed by [facility].  Most recent PSA near 11.  He has had atypia on biopsy.  November 2008  . Essential hypertension, benign 06/08/2012  . Full dentures   . Glaucoma   . Heart murmur    since childhood, never has caused any problems  . History of rheumatic fever 11/04/2015  . Hyperlipidemia 04/25/2013  . Hypertension   . Malignant  neoplasm of left lung ([place]) 02/08/2017  . RBBB 07/12/2017  . Sickle cell trait ([place])    no problems per patient    Past Surgical History:  Procedure Laterality Date  . BIOPSY  11/11/2017   Procedure: BIOPSY;  Surgeon: Lavena Bullion, DO;  Location: [facility];  Service: Gastroenterology;;  . cataract eye surgery Right   . COLONOSCOPY  11/2009   hx polyps/[place]  . ESOPHAGOGASTRODUODENOSCOPY N/A 11/11/2017   Procedure: ESOPHAGOGASTRODUODENOSCOPY (EGD);  Surgeon: Lavena Bullion, DO;  Location: [facility];  Service: Gastroenterology;  Laterality: N/A;  . PROSTATE BIOPSY    . right eye surgery     to lower eye pressure  . TONSILLECTOMY    . VIDEO ASSISTED THORACOSCOPY (VATS)/ LOBECTOMY Right 12/26/2015  . wisdom tteeth ext      Prior to Admission medications   Medication Sig Start Date End Date Taking? Authorizing Provider  amLODipine (NORVASC) 2.5 MG tablet Take 1 tablet (2.5 mg total) by mouth daily. 11/07/17  Yes [name], MD  aspirin EC 81 MG tablet Take 81 mg by mouth daily.   Yes [provider]  Brinzolamide-Brimonidine (SIMBRINZA) 1-0.2 % SUSP Place 2-3 drops into both eyes 3 (three) times daily.    Yes [provider]  budesonide-formoterol (SYMBICORT) 80-4.5 MCG/ACT inhaler Inhale 2 puffs into the lungs 2 (two) times daily. 11/01/17  Yes [name], MD  metoprolol succinate (TOPROL-XL) 50 MG 24 hr tablet Take 1/2 tablet daily.Take with or immediately following a meal. 06/23/17  Yes Luking, Elayne Snare, MD  Multiple Vitamin (MULTIVITAMIN) tablet Take 1 tablet by mouth daily.   Yes [provider]  Netarsudil-Latanoprost (ROCKLATAN) 0.02-0.005 % SOLN Place 1 drop into both eyes at bedtime.    Yes Marylynn Pearson, MD  pantoprazole (PROTONIX) 40 MG tablet Take 1 tablet (40 mg total) by mouth 2 (two) times daily. 11/30/17 01/29/18 Yes Willia Craze, NP  ketoconazole (NIZORAL) 2 % cream Apply to area twice daily Patient not taking: Reported  on 01/09/2018 01/03/18   [name], MD  torsemide [facility]) 20 MG tablet Take two tablets by mouth every morning Patient not taking: Reported on 01/09/2018 01/09/18   [name], MD    Scheduled Meds: . amLODipine  2.5 mg Oral Daily  . feeding supplement  1 Container Oral TID BM  . metoprolol succinate  25 mg Oral Daily  . mometasone-formoterol  2 puff Inhalation BID  . Netarsudil-Latanoprost  1 drop Both Eyes QHS  . pantoprazole (PROTONIX) IV  40 mg Intravenous Q12H  . torsemide  40 mg Oral Daily   Infusions:  PRN Meds: acetaminophen **OR** acetaminophen, ondansetron **OR** ondansetron (ZOFRAN) IV   Allergies as of 01/09/2018 - Review Complete 01/09/2018  Allergen Reaction Noted  . Atorvastatin  07/31/2017    Family History  Problem  Relation Age of Onset  . Diabetes Mother   . Cerebral aneurysm Mother   . Lung cancer Father   . Diabetes Sister   . Colon cancer Neg Hx   . Colon polyps Neg Hx   . Esophageal cancer Neg Hx   . Rectal cancer Neg Hx   . Stomach cancer Neg Hx     Social History   Socioeconomic History  . Marital status: Married    Spouse name: Not on file  . Number of children: Not on file  . Years of education: Not on file  . Highest education level: Not on file  Occupational History  . Not on file  Social Needs  . Financial resource strain: Not on file  . Food insecurity:    Worry: Not on file    Inability: Not on file  . Transportation needs:    Medical: Not on file    Non-medical: Not on file  Tobacco Use  . Smoking status: Former Smoker    Packs/day: 1.00    Types: Cigarettes    Last attempt to quit: 01/19/2011    Years since quitting: 6.9  . Smokeless tobacco: Never Used  Substance and Sexual Activity  . Alcohol use: Yes    Alcohol/week: 1.0 standard drinks    Types: 1 Shots of liquor per week  . Drug use: No  . Sexual activity: Not on file  Lifestyle  . Physical activity:    Days per week: Not on file    Minutes per  session: Not on file  . Stress: Not on file  Relationships  . Social connections:    Talks on phone: Not on file    Gets together: Not on file    Attends religious service: Not on file    Active member of club or organization: Not on file    Attends meetings of clubs or organizations: Not on file    Relationship status: Not on file  . Intimate partner violence:    Fear of current or ex partner: Not on file    Emotionally abused: Not on file    Physically abused: Not on file    Forced sexual activity: Not on file  Other Topics Concern  . Not on file  Social History Narrative  . Not on file    REVIEW OF SYSTEMS: Constitutional: Fatigue. ENT:  No nose bleeds Pulm: Stable DOE.  Cough. CV:  No palpitations, no chest pain.  Increased lower extremity edema. GU:  No hematuria, no frequency GI: See HPI. Heme: Other than the dark and/or tarry stools, has not had any unusual or excessive bleeding/bruising. Transfusions: See HPI. Neuro:  No headaches, no peripheral tingling or numbness.  No dizziness, no syncope. Derm:  No itching, no rash or sores.  Endocrine:  No sweats or chills.  No polyuria or dysuria Immunization: And ask him if he had a current flu shot. Travel:  None beyond local counties in last few months.    PHYSICAL EXAM: Vital signs in last 24 hours: Vitals:   01/10/18 0114 01/10/18 0500  BP: 127/60 (!) 114/59  Pulse: 89 80  Resp: 18 18  Temp: 97.8 F (36.6 C) 98 F (36.7 C)  SpO2: 93% 93%   Wt Readings from Last 3 Encounters:  01/09/18 90.7 kg  01/09/18 99.7 kg  12/14/17 88.5 kg    General: Elderly, somewhat frail though not cachectic looking AAM.  Comfortable.  Looks chronically ill. Head: No facial asymmetry or swelling.  No  signs of head trauma. Eyes: No scleral icterus.  No conjunctival pallor. Ears: Not hard of hearing Nose: No discharge Mouth: Pink, moist oral mucosa.  Nonspecific white coating on the margin of the tongue.  Tongue midline. Neck: No  mass, no JVD. Lungs: Crackles in the bases more prominent on the left.  Dyspneic when he speaks long sentences. Heart: RRR.  No MRG.  S1, S2 present Abdomen: Obese.  Soft.  Not tender, not distended.  Active bowel sounds.  No organomegaly, bruits, masses..   Rectal: Deferred rectal exam.  Stool FOBT positive in the lab. GU: Scrotal edema Musc/Skeltl: No gross joint deformities or swelling Extremities: Edema in the upper and lower legs with woody changes in the lower legs.  3+ pitting. Neurologic: Alert.  Oriented x3.  Moves all 4 limbs, strength not tested.  No tremor.  Speech pattern somewhat slow but precise. Skin: Woody, dry skin changes in the lower legs. Tattoos: None Nodes: No cervical adenopathy. Psych: Pleasant, cooperative, calm.  Intake/Output from previous day: 12/23 0701 - 12/24 0700 In: 418 [Blood:418] Out: 825 [Urine:825] Intake/Output this shift: No intake/output data recorded.  LAB RESULTS: Recent Labs    01/09/18 1204 01/09/18 1651 01/10/18 0406  WBC 22.0* 22.2*  --   HGB 6.4* 6.4* 7.9*  HCT 21.1* 21.7* 25.1*  PLT 361 387  --    BMET Lab Results  Component Value Date   NA 137 01/10/2018   NA 138 01/09/2018   NA 136 01/09/2018   K 3.3 (L) 01/10/2018   K 3.6 01/09/2018   K 3.0 (L) 01/09/2018   CL 102 01/10/2018   CL 102 01/09/2018   CL 104 01/09/2018   CO2 21 (L) 01/10/2018   CO2 21 (L) 01/09/2018   CO2 21 (L) 01/09/2018   GLUCOSE 103 (H) 01/10/2018   GLUCOSE 106 (H) 01/09/2018   GLUCOSE 107 (H) 01/09/2018   BUN 76 (H) 01/10/2018   BUN 76 (H) 01/09/2018   BUN 78 (H) 01/09/2018   CREATININE 2.48 (H) 01/10/2018   CREATININE 2.72 (H) 01/09/2018   CREATININE 2.60 (H) 01/09/2018   CALCIUM 7.6 (L) 01/10/2018   CALCIUM 7.6 (L) 01/09/2018   CALCIUM 7.5 (L) 01/09/2018   LFT Recent Labs    01/09/18 1204 01/09/18 1651  PROT 4.9* 4.7*  ALBUMIN 1.8* 1.6*  AST 54* 56*  ALT 30 33  ALKPHOS 80 82  BILITOT 0.4 0.4  BILIDIR 0.1  --   IBILI 0.3  --      PT/INR Lab Results  Component Value Date   INR 1.21 01/09/2018   INR 1.09 11/09/2017   Hepatitis Panel No results for input(s): HEPBSAG, HCVAB, HEPAIGM, HEPBIGM in the last 72 hours. C-Diff No components found for: CDIFF Lipase     Component Value Date/Time   LIPASE 48 01/09/2018 1651    Drugs of Abuse  No results found for: LABOPIA, COCAINSCRNUR, LABBENZ, AMPHETMU, THCU, LABBARB   RADIOLOGY STUDIES: Ct Abdomen Pelvis Wo Contrast  Result Date: 01/09/2018 CLINICAL DATA:  Low hemoglobin. EXAM: CT ABDOMEN AND PELVIS WITHOUT CONTRAST TECHNIQUE: Multidetector CT imaging of the abdomen and pelvis was performed following the standard protocol without IV contrast. COMPARISON:  CT 02/04/2015, abdomen MRI 12/09/2017 FINDINGS: Lower chest: Small bilateral pleural effusions. Cardiomegaly without pericardial effusion or thickening. Atherosclerosis of the included thoracic aorta without aneurysm. Coronary arteriosclerosis is seen. Pulmonary consolidation/atelectasis in the right middle lobe distribution. Hepatobiliary: Layering biliary sludge within the gallbladder without secondary signs of acute cholecystitis. The unenhanced liver  demonstrates a granuloma in the right hepatic lobe. No definite mass or biliary dilatation. Pancreas: Unremarkable. No pancreatic ductal dilatation or surrounding inflammatory changes. Spleen: Normal size spleen with scattered calcifications noted within possibly representing vascular calcifications or granulomata. Adrenals/Urinary Tract: Scattered bilateral hyperdense lesions of the kidneys are nonspecific but may represent proteinaceous or hemorrhagic cysts, the largest is right-sided and interpolar measuring up 1.6 cm with more ill-defined hyperdensity slightly exophytic off the right kidney. No nephrolithiasis or obstructive uropathy. The urinary bladder is unremarkable for the degree of distention. Stomach/Bowel: Stomach is within normal limits. The appendix is not  confidently identified. No evidence of bowel wall thickening, distention, or inflammatory changes. Vascular/Lymphatic: Aortic atherosclerosis. No lymphadenopathy. Reproductive: Prostatomegaly measuring up to 5.7 cm. Other: Diffuse soft tissue anasarca. No ascites or free air. 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 IMPRESSION: 1. Pulmonary consolidation/atelectasis in the right middle lobe with small bilateral pleural effusions. 2. Scattered bilateral hyperdense lesions of the kidneys likely to represent proteinaceous or hemorrhagic cysts, the largest is in the right interpolar and interpolar measuring 1.6 cm with more ill-defined hyperdensity slightly exophytic off the right kidney. Findings better characterized on recent prior MRI. 3. Hepatic and splenic granulomata. 4. Diffuse soft tissue anasarca. 5. 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 Aortic Atherosclerosis (ICD10-I70.0). Electronically Signed   By: Ashley Royalty M.D.   On: 01/09/2018 20:33   Dg Chest Portable 1 View  Result Date: 01/09/2018 CLINICAL DATA:  Shortness of breath for 1 day. 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 EXAM: PORTABLE CHEST 1 VIEW COMPARISON:  Radiographs 11/09/2017. Abdominal MRI 12/09/2017. PET-CT 11/25/2015. FINDINGS: 1902 hours. There are right-greater-than-left pleural effusions which have enlarged. There is associated bibasilar pulmonary opacity, likely atelectasis. There are stable postsurgical changes in the right hilar region. No pneumothorax or acute osseous findings are seen. IMPRESSION: Enlarging right-greater-than-left pleural effusions with associated bibasilar pulmonary opacities, likely atelectasis. No invert pulmonary edema. Electronically Signed   By: Richardean Sale M.D.   On: 01/09/2018 19:35     IMPRESSION:   *    Acute on chronic edema.  By his story it is not significantly symptomatic as his DOE is stable.  Received 2 U PRBCs thus far  *    Recent GI bleed and anemia  from duodenal and gastric ulcers.  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  Antibiotic therapy for H pylori, continues on twice daily Protonix.  *    Benign duodenal nodule  *    Anasarca, increased LE edema and new scrotal edema.  Low albumin.    *     Acute on chronic kidney disease.  This may be contributing to the anemia.    PLAN:     *    Repeat EGD, depending on endoscopy unit schedule may be able to complete this this afternoon.    Keep n.p.o. for this though he can take oral meds if needed.  *    Prealbumin level.    *    Leaving twice daily IV Protonix in place.  *     Serial hemoglobins.   Azucena Freed  01/10/2018, 9:24 AM Phone [phone]

## 2018-01-10 NOTE — Interval H&P Note (Signed)
History and Physical Interval Note:  01/10/2018 2:38 PM  Andrew Guerra  has presented today for surgery, with the diagnosis of anemia, tarry stool.  gastric and duodenal ulcers in 10/2017  The various methods of treatment have been discussed with the patient and family. After consideration of risks, benefits and other options for treatment, the patient has consented to  Procedure(s): ESOPHAGOGASTRODUODENOSCOPY (EGD) WITH PROPOFOL (N/A) as a surgical intervention .  The patient's history has been reviewed, patient examined, no change in status, stable for surgery.  I have reviewed the patient's chart and labs.  Questions were answered to the patient's satisfaction.     [name]

## 2018-01-10 NOTE — Anesthesia Preprocedure Evaluation (Addendum)
Anesthesia Evaluation  Patient identified by MRN, date of birth, ID band Patient awake    Reviewed: Allergy & Precautions, NPO status , Patient's Chart, lab work & pertinent test results  Airway Mallampati: II  TM Distance: >3 FB Neck ROM: Full    Dental no notable dental hx.    Pulmonary COPD,  COPD inhaler, former smoker,    Pulmonary exam normal breath sounds clear to auscultation       Cardiovascular hypertension, +CHF  Normal cardiovascular exam+ Valvular Problems/Murmurs AS  Rhythm:Regular Rate:Normal + Systolic murmurs Hx/o Bicuspid AV Echo 10/24/2017-Left ventricle: The cavity size was normal. Wall thickness was increased in a pattern of moderate LVH. Systolic function was normal. The estimated ejection fraction was in the range of 60% to 65%. Wall motion was normal; there were no regional wall motion abnormalities. Doppler parameters are consistent with abnormal left ventricular relaxation (grade 1 diastolic dysfunction). - Aortic valve: Moderately calcified annulus. Moderately thickened, moderately calcified leaflets. 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 Valve area (VTI): 1.66 cm^2. Valve area (Vmax): 1.75 cm^2. Valve area  (Vmean): 1.58 cm^2. - Right atrium: The atrium was mildly dilated. - Pulmonary arteries: Systolic pressure was moderately increased. PA peak pressure: 56 mm Hg (S).    Neuro/Psych negative neurological ROS  negative psych ROS   GI/Hepatic Neg liver ROS, PUD,   Endo/Other  negative endocrine ROS  Renal/GU Renal InsufficiencyRenal disease  negative genitourinary   Musculoskeletal negative musculoskeletal ROS (+)   Abdominal   Peds negative pediatric ROS (+)  Hematology negative hematology ROS (+) anemia ,   Anesthesia Other Findings   Reproductive/Obstetrics negative OB ROS                            Anesthesia Physical Anesthesia Plan  ASA: III  Anesthesia Plan: MAC    Post-op Pain Management:    Induction: Intravenous  PONV Risk Score and Plan: 0  Airway Management Planned: Simple Face Mask  Additional Equipment:   Intra-op Plan:   Post-operative Plan:   Informed Consent: I have reviewed the patients History and Physical, chart, labs and discussed the procedure including the risks, benefits and alternatives for the proposed anesthesia with the patient or authorized representative who has indicated his/her understanding and acceptance.   Dental advisory given  Plan Discussed with: CRNA and Surgeon  Anesthesia Plan Comments:         Anesthesia Quick Evaluation

## 2018-01-10 NOTE — Transfer of Care (Signed)
Immediate Anesthesia Transfer of Care Note  Patient: Andrew Guerra  Procedure(s) Performed: ESOPHAGOGASTRODUODENOSCOPY (EGD) WITH PROPOFOL (N/A ) BIOPSY  Patient Location: PACU  Anesthesia Type:MAC  Level of Consciousness: awake and alert   Airway & Oxygen Therapy: Patient Spontanous Breathing and Patient connected to nasal cannula oxygen  Post-op Assessment: Report given to RN and Post -op Vital signs reviewed and stable  Post vital signs: Reviewed and stable  Last Vitals:  Vitals Value Taken Time  BP 116/50 01/10/2018  3:03 PM  Temp 36.8 C 01/10/2018  3:03 PM  Pulse 83 01/10/2018  3:11 PM  Resp 23 01/10/2018  3:11 PM  SpO2 92 % 01/10/2018  3:11 PM  Vitals shown include unvalidated device data.  Last Pain:  Vitals:   01/10/18 1503  TempSrc: Oral  PainSc: 0-No pain         Complications: No apparent anesthesia complications

## 2018-01-11 NOTE — Progress Notes (Signed)
[facility] Subjective: Patient seems to be fairly be doing fairly well today. He has acute on chronic iron deficiency anemia secondary to peptic ulcer disease and has completed a course of triple therapy for H. Pylori infection since his last admission here when he had similar diagnosis He denies having any nausea vomiting abdominal pain or melena. He's noticed some dark old blood in the stool today but is otherwise asymptomatic. He is waiting for a clear liquid meal this morning. Endoscopy done yesterday revealed multiple loculated ulcers in the stomach and the duodenum. His previous EGD done on 11/11/2017 non-bleeding gastric and duodenal ulcers.  Objective: Vital signs in last 24 hours: Temp:  [98.2 F (36.8 C)-98.7 F (37.1 C)] 98.3 F (36.8 C) (12/25 0553) Pulse Rate:  [82-89] 85 (12/25 0833) Resp:  [16-23] 22 (12/25 0553) BP: (110-169)/(48-73) 127/63 (12/25 0833) SpO2:  [92 %-96 %] 93 % (12/25 0553) Last BM Date: 01/09/18  Intake/Output from previous day: 12/24 0701 - 12/25 0700 In: -  Out: 450 [Urine:450] Intake/Output this shift: No intake/output data recorded.  General appearance: alert, cooperative, appears stated age and no distress Resp: clear to auscultation bilaterally Cardio: regular rate and rhythm, S1, S2 normal, no murmur, click, rub or gallop GI: soft, non-tender; bowel sounds normal; no masses,  no organomegaly Extremities: extremities normal, atraumatic, no cyanosis or edema  Lab Results: Recent Labs    01/09/18 1204 01/09/18 1651 01/10/18 0406 01/10/18 1058  WBC 22.0* 22.2*  --   --   HGB 6.4* 6.4* 7.9* 7.8*  HCT 21.1* 21.7* 25.1* 24.2*  PLT 361 387  --   --    BMET Recent Labs    01/09/18 1204 01/09/18 1651 01/10/18 0406  NA 136 138 137  K 3.0* 3.6 3.3*  CL 104 102 102  CO2 21* 21* 21*  GLUCOSE 107* 106* 103*  BUN 78* 76* 76*  CREATININE 2.60* 2.72* 2.48*  CALCIUM 7.5* 7.6* 7.6*   LFT Recent Labs    01/09/18 1204  01/09/18 1651  PROT 4.9* 4.7*  ALBUMIN 1.8* 1.6*  AST 54* 56*  ALT 30 33  ALKPHOS 80 82  BILITOT 0.4 0.4  BILIDIR 0.1  --   IBILI 0.3  --    PT/INR Recent Labs    01/09/18 1651  LABPROT 15.2  INR 1.21   Medications: I have reviewed the patient's current medications.  Assessment/Plan: 1) Acute on chronic iron deficiency anemia secondary to gastric and duodenal ulcers-hemoglobin 7.8 g/dL today. Reveal monitor his CBCs closely and transfuse as needed.  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 3) malignant neoplasm of the left lung.  LOS: 2 days   Sherif Millspaugh 01/11/2018, 11:30 AM

## 2018-01-11 NOTE — Progress Notes (Signed)
Patient Demographics:    Andrew Guerra, is a 77 y.o. male, DOB - 1940-07-19, FGB:[id number]  Admit date - 01/09/2018   Admitting Physician [name], DO  Outpatient Primary MD for the patient is [name], MD  LOS - 2   Chief Complaint  Patient presents with  . 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 male with a PMH of HTN, HLD, bicuspid aortic valve with moderate AS, NSCLC s/p resection and radiation, and PUD admitted from PCPs office on 01/09/2018 with hemoglobin of 6.4  Plan:- 1)Acute Gi Bleed--- status post EGD on 01/10/2018 with nonbleeding gastric and duodenal ulcers, biopsies are pending, continue PPI twice daily, monitor H&H and transfuse as clinically indicated  2)HFpEF--acute on chronic diastolic dysfunction CHF exacerbation, last known EF 60 to 65% with moderate aortic stenosis... Patient with anasarca, evidence of left and right heart failure, will get cardiology consult to help [name] with diuresis given significant/massive volume overload in the setting of low albumin and worsening renal function  3)NSCLC - post prior surgery and radiation, patient goes to [facility] every 6 months Looks like patient had this surgically resected, then had recurrence followed by SBRT with shrinking of the recurrent tumor according to oncology office visit notes.  4) acute blood loss anemia--- secondary to #1 above,  admission hemoglobin was 6.4 improved post transfusion of 2 units of packed cells,  5) leukocytosis-- ??  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 Scheduled to see hematology outpatient but not until 03/2018--- be judicious with blood transfusion  7) Moderate aortic stenosis--- most likely contributing to #2 above  8)FEN--poor oral intake, weight loss and low albumin----nutritional supplements advised, patient albumin has been less than 2, will need IV albumin with IV diuretics  Disposition/Need for in-Hospital Stay- patient unable to be discharged at this time due to significant worsening of heart failure and volume overload requiring further diuresis and monitoring (see subjective section above)  Code Status : Full  Family Communication:   Wife not at bedside   Disposition Plan  : To be determined  Consults  : Cardiology/GI..... Consider hematology consult  DVT Prophylaxis  :  TEDS/ SCDs   Lab Results  Component Value Date   PLT 387 01/09/2018    Inpatient Medications  Scheduled Meds: . amLODipine  2.5 mg Oral Daily  . metoprolol succinate  25 mg Oral Daily  . mometasone-formoterol  2 puff Inhalation BID  . Netarsudil-Latanoprost  1 drop Both Eyes QHS  . pantoprazole  40 mg Oral BID  . torsemide  40 mg Oral Daily   Continuous Infusions: PRN Meds:.acetaminophen **OR** acetaminophen, ondansetron **OR** ondansetron (ZOFRAN) IV    Anti-infectives (From admission, onward)   None        Objective:   Vitals:   01/10/18 2023 01/11/18 0553 01/11/18 0833 01/11/18 1525  BP: 127/64 (!) 122/58 127/63 125/67  Pulse: 89 86 85 95  Resp:  (!) 22  18  Temp: 98.3 F (36.8 C) 98.3 F (36.8 C)  98.4 F (36.9 C)  TempSrc: Oral Oral    SpO2: 95% 93%  (!)  76%  Weight:      Height:        Wt Readings from Last 3 Encounters:  01/09/18 90.7 kg  01/09/18 99.7 kg  12/14/17 88.5 kg     Intake/Output Summary (Last 24 hours) at 01/11/2018 1928 Last data filed at 01/11/2018 0559 Gross per 24 hour  Intake -  Output 450 ml  Net -450 ml     Physical Exam Patient is examined daily including today on 01/11/18  , exams remain the same as of yesterday except that has changed   Gen:- Awake Alert, conversational dyspnea, HEENT:- [place].AT, No sclera icterus Neck-Supple Neck,No JVD,.  Lungs-diminished in bases scattered rales, no wheezing CV- S1, S2 normal, regular 3/6 SM Abd-  +ve B.Sounds, Abd Soft, No tenderness,    Extremity/Skin:-3+ edema/anasarca extending from the toes into the lower abdomen with significant involvement of the penis and scrotum, pedal pulses present  Psych-affect is appropriate, oriented x3 Neuro-no new focal deficits, no tremors GU--- patient with significant scrotal and penile edema,   Data Review:   Micro Results No results found for this or any previous visit (from the past 240 hour(s)).  Radiology Reports Ct Abdomen Pelvis Wo Contrast  Result Date: 01/09/2018 CLINICAL DATA:  Low hemoglobin. EXAM: CT ABDOMEN AND PELVIS WITHOUT CONTRAST TECHNIQUE: Multidetector CT imaging of the abdomen and pelvis was performed following the standard protocol without IV contrast. COMPARISON:  CT 02/04/2015, abdomen MRI 12/09/2017 FINDINGS: Lower chest: Small bilateral pleural effusions. Cardiomegaly without pericardial effusion or thickening. Atherosclerosis of the included thoracic aorta without aneurysm. Coronary arteriosclerosis is seen. Pulmonary consolidation/atelectasis in the right middle lobe distribution. Hepatobiliary: Layering biliary sludge within the gallbladder without secondary signs of acute cholecystitis. The unenhanced liver demonstrates a granuloma in the right hepatic lobe. No definite mass or biliary dilatation.  Pancreas: Unremarkable. No pancreatic ductal dilatation or surrounding inflammatory changes. Spleen: Normal size spleen with scattered calcifications noted within possibly representing vascular calcifications or granulomata. Adrenals/Urinary Tract: Scattered bilateral hyperdense lesions of the kidneys are nonspecific but may represent proteinaceous or hemorrhagic cysts, the largest is right-sided and interpolar measuring up 1.6 cm with more ill-defined hyperdensity slightly exophytic off the right kidney. No nephrolithiasis or obstructive uropathy. The urinary bladder is unremarkable for the degree of distention. Stomach/Bowel: Stomach is within normal limits. The appendix is not confidently identified. No evidence of bowel wall thickening, distention, or inflammatory changes. Vascular/Lymphatic: Aortic atherosclerosis. No lymphadenopathy. Reproductive: Prostatomegaly measuring up to 5.7 cm. Other: Diffuse soft tissue anasarca. No ascites or free air. 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 IMPRESSION: 1. Pulmonary consolidation/atelectasis in the right middle lobe with small bilateral pleural effusions. 2. Scattered bilateral hyperdense lesions of the kidneys likely to represent proteinaceous or hemorrhagic cysts, the largest is in the right interpolar and interpolar measuring 1.6 cm with more ill-defined hyperdensity slightly exophytic off the right kidney. Findings better characterized on recent  prior MRI. 3. Hepatic and splenic granulomata. 4. Diffuse soft tissue anasarca. 5. 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 Aortic Atherosclerosis (ICD10-I70.0). Electronically Signed   By: Ashley Royalty M.D.   On: 01/09/2018 20:33   Dg Chest Portable 1 View  Result Date: 01/09/2018 CLINICAL DATA:  Shortness of breath for 1 day. 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 EXAM: PORTABLE CHEST 1 VIEW COMPARISON:  Radiographs 11/09/2017. Abdominal MRI 12/09/2017. PET-CT 11/25/2015. FINDINGS: 1902  hours. There are right-greater-than-left pleural effusions which have enlarged. There is associated bibasilar pulmonary opacity, likely atelectasis. There are stable postsurgical changes in the right hilar region. No pneumothorax or acute osseous findings are seen. IMPRESSION: Enlarging right-greater-than-left pleural effusions with associated bibasilar pulmonary opacities, likely atelectasis. No invert pulmonary edema. Electronically Signed   By: Richardean Sale M.D.   On: 01/09/2018 19:35     CBC Recent Labs  Lab 01/09/18 1052 01/09/18 1204 01/09/18 1651 01/10/18 0406 01/10/18 1058  WBC  --  22.0* 22.2*  --   --   HGB 6.2* 6.4* 6.4* 7.9* 7.8*  HCT  --  21.1* 21.7* 25.1* 24.2*  PLT  --  361 387  --   --   MCV  --  77.0* 78.1*  --   --   MCH  --  23.4* 23.0*  --   --   MCHC  --  30.3 29.5*  --   --   RDW  --  22.0* 21.8*  --   --   LYMPHSABS  --  0.5*  --   --   --   MONOABS  --  1.2*  --   --   --   EOSABS  --  0.0  --   --   --   BASOSABS  --  0.0  --   --   --     Chemistries  Recent Labs  Lab 01/09/18 1204 01/09/18 1651 01/10/18 0406  NA 136 138 137  K 3.0* 3.6 3.3*  CL 104 102 102  CO2 21* 21* 21*  GLUCOSE 107* 106* 103*  BUN 78* 76* 76*  CREATININE 2.60* 2.72* 2.48*  CALCIUM 7.5* 7.6* 7.6*  AST 54* 56*  --   ALT 30 33  --   ALKPHOS 80 82  --   BILITOT 0.4 0.4  --    ------------------------------------------------------------------------------------------------------------------ No results for input(s): CHOL, HDL, LDLCALC, TRIG, CHOLHDL, LDLDIRECT in the last 72 hours.  No results found for: HGBA1C ------------------------------------------------------------------------------------------------------------------ No results for input(s): TSH, T4TOTAL, T3FREE, THYROIDAB in the last 72 hours.  Invalid input(s): FREET3 ------------------------------------------------------------------------------------------------------------------ No results for input(s):  VITAMINB12, FOLATE, FERRITIN, TIBC, IRON, RETICCTPCT in the last 72 hours.  Coagulation profile Recent Labs  Lab 01/09/18 1651  INR 1.21    No results for input(s): DDIMER in the last 72 hours.  Cardiac Enzymes No results for input(s): CKMB, TROPONINI, MYOGLOBIN in the last 168 hours.  Invalid input(s): CK ------------------------------------------------------------------------------------------------------------------    Component Value Date/Time   BNP 598.0 (H) 01/09/2018 1205     Roxan Hockey M.D on 01/11/2018 at 7:28 PM  Pager---[phone] Go to [email or web address] - password [email or web address] for contact info  [facility] - Office  [phone]

## 2018-01-12 ENCOUNTER — Encounter: Payer: Self-pay | Admitting: Family Medicine

## 2018-01-12 ENCOUNTER — Encounter: Payer: Self-pay | Admitting: Cardiology

## 2018-01-12 DIAGNOSIS — R601 Generalized edema: Secondary | ICD-10-CM

## 2018-01-12 DIAGNOSIS — K922 Gastrointestinal hemorrhage, unspecified: Secondary | ICD-10-CM

## 2018-01-12 DIAGNOSIS — E440 Moderate protein-calorie malnutrition: Secondary | ICD-10-CM

## 2018-01-12 DIAGNOSIS — I5033 Acute on chronic diastolic (congestive) heart failure: Secondary | ICD-10-CM

## 2018-01-12 DIAGNOSIS — I10 Essential (primary) hypertension: Secondary | ICD-10-CM

## 2018-01-12 DIAGNOSIS — Q231 Congenital insufficiency of aortic valve: Secondary | ICD-10-CM

## 2018-01-12 DIAGNOSIS — D500 Iron deficiency anemia secondary to blood loss (chronic): Secondary | ICD-10-CM

## 2018-01-12 DIAGNOSIS — Q230 Congenital stenosis of aortic valve: Secondary | ICD-10-CM

## 2018-01-12 DIAGNOSIS — D509 Iron deficiency anemia, unspecified: Secondary | ICD-10-CM

## 2018-01-12 DIAGNOSIS — K274 Chronic or unspecified peptic ulcer, site unspecified, with hemorrhage: Secondary | ICD-10-CM

## 2018-01-12 LAB — BASIC METABOLIC PANEL
Anion gap: 14 (ref 5–15)
BUN: 79 mg/dL — AB (ref 8–23)
CO2: 23 mmol/L (ref 22–32)
Calcium: 7.7 mg/dL — ABNORMAL LOW (ref 8.9–10.3)
Chloride: 101 mmol/L (ref 98–111)
Creatinine, Ser: 2.78 mg/dL — ABNORMAL HIGH (ref 0.61–1.24)
GFR calc Af Amer: 24 mL/min — ABNORMAL LOW (ref >60)
GFR calc non Af Amer: 21 mL/min — ABNORMAL LOW (ref >60)
GLUCOSE: 115 mg/dL — AB (ref 70–99)
Potassium: 3.4 mmol/L — ABNORMAL LOW (ref 3.5–5.1)
Sodium: 138 mmol/L (ref 135–145)

## 2018-01-12 LAB — CBC
HCT: 29.1 % — ABNORMAL LOW (ref 39.0–52.0)
Hemoglobin: 9.1 g/dL — ABNORMAL LOW (ref 13.0–17.0)
MCH: 23.6 pg — AB (ref 26.0–34.0)
MCHC: 31.3 g/dL (ref 30.0–36.0)
MCV: 75.6 fL — AB (ref 80.0–100.0)
Platelets: 360 10*3/uL (ref 150–400)
RBC: 3.85 MIL/uL — ABNORMAL LOW (ref 4.22–5.81)
RDW: 21 % — ABNORMAL HIGH (ref 11.5–15.5)
WBC: 26.3 10*3/uL — ABNORMAL HIGH (ref 4.0–10.5)
nRBC: 0.2 % (ref 0.0–0.2)

## 2018-01-12 MED ORDER — CARVEDILOL 12.5 MG PO TABS: 12.5000 mg | ORAL_TABLET | Freq: Two times a day (BID) | ORAL | Status: DC

## 2018-01-12 MED ORDER — ALBUMIN HUMAN 25 % IV SOLN
12.5000 g | Freq: Two times a day (BID) | INTRAVENOUS | Status: DC
  Administered 2018-01-12 – 2018-01-13 (×3): 12.5 g via INTRAVENOUS
  Filled 2018-01-12 (×4): qty 50

## 2018-01-12 MED ORDER — CARVEDILOL 6.25 MG PO TABS
6.2500 mg | ORAL_TABLET | Freq: Two times a day (BID) | ORAL | Status: DC
  Administered 2018-01-12 – 2018-01-17 (×9): 6.25 mg via ORAL
  Filled 2018-01-12 (×10): qty 1

## 2018-01-12 MED ORDER — FUROSEMIDE 10 MG/ML IJ SOLN
80.0000 mg | Freq: Two times a day (BID) | INTRAMUSCULAR | Status: DC
  Administered 2018-01-12 – 2018-01-13 (×3): 80 mg via INTRAVENOUS
  Filled 2018-01-12 (×3): qty 8

## 2018-01-12 MED ORDER — POTASSIUM CHLORIDE CRYS ER 20 MEQ PO TBCR
40.0000 meq | EXTENDED_RELEASE_TABLET | Freq: Once | ORAL | Status: AC
  Administered 2018-01-12: 40 meq via ORAL
  Filled 2018-01-12: qty 2

## 2018-01-12 MED ORDER — ADULT MULTIVITAMIN W/MINERALS CH
1.0000 | ORAL_TABLET | Freq: Every day | ORAL | Status: DC
  Administered 2018-01-13 – 2018-01-29 (×17): 1 via ORAL
  Filled 2018-01-12 (×17): qty 1

## 2018-01-12 MED ORDER — ENSURE ENLIVE PO LIQD
237.0000 mL | Freq: Two times a day (BID) | ORAL | Status: DC
  Administered 2018-01-12 – 2018-01-15 (×5): 237 mL via ORAL

## 2018-01-12 MED ORDER — ORAL CARE MOUTH RINSE
15.0000 mL | Freq: Two times a day (BID) | OROMUCOSAL | Status: DC
  Administered 2018-01-13 – 2018-02-08 (×36): 15 mL via OROMUCOSAL

## 2018-01-12 NOTE — Plan of Care (Signed)
  Problem: Safety: Goal: Ability to remain free from injury will improve Outcome: Progressing Note:  Resting in recliner; POC reviewed with pt.

## 2018-01-12 NOTE — Consult Note (Signed)
Cardiology Consultation:   Patient ID: Andrew Guerra; [id number]; May 21, 1940   Admit date: 01/09/2018 Date of Consult: 01/12/2018  Primary Care Provider: [name], MD Primary Cardiologist: Andrew Him, MD Primary Electrophysiologist:  None   Patient Profile:   Andrew Guerra is a 77 y.o. male with a PMH of HTN, HLD, bicuspid aortic valve with moderate AS, NSCLC s/p resection and radiation, and PUD, who is being seen today for the evaluation of CHF at the request of Dr. Denton Guerra.  History of Present Illness:   Andrew Guerra presented on 01/09/18 after being sent from his PCP office for a Hgb of 6.4. He was seen at his PCP office earlier that day with complaints of SOB, dry cough, and increased peripheral edema. This has been an intermittent issue since admission 10/2017 for GI bleed, at which time he diuresed with IV lasix and discharged home on po lasix daily. He has continued to have difficulty with DOE and LE edema since that time, significantly worsening over the past 2 weeks. He reports mild SOB at rest but significant SOB with ambulating short distances. He reports LE edema started in the ankles, however has progressed upward rapidly in recent weeks, now affecting his scrotum and lower abdomen. He denies difficulty urinating at this time. He denies orthopnea, PND, chest pain, dizziness, lightheadedness, or syncope. He reported having labile blood pressures recently and was taken off hydralazine. He also reports recently being transitioned from lasix to torsemide. He reports weight loss of ~10lbs in the past 2 months. He has been receiving outpatient Iron transfusions without improvement in his hemoglobin. He reports that his GI doctor referred Guerra to a hematologist, however he does not know why - appears appointment is scheduled for 03/2018.   He was last seen by cardiology, Andrew Guerra 06/2017 at which time he had no cardiac complaints and was felt to be asymptomatic from an aortic  stenosis standpoint. He underwent an echocardiogram 10/2017 which showed EF 60-65%, G1DD, no wall motion abnormalities, moderate AS, and moderate pulm HTN. He was recommended for repeat echo in 1 year for close monitoring or sooner if patient developed symptoms.   Hospital course: VSS. Labs notable for 3.0>3.4 today, Cr 2.6>2.78 today, WBC 22>26.3 today, Hgb 6.4>9.1 s/p 2 uPRBCs, PLT 387, ESR elevated, ANA negative, BNP 598. CXR with R>L pleural effusion without over pulmonary edema. CT A/P with small bilateral pleural effusions, kidney lesions (benign on prior MRI), diffuse soft tissue anasarca. GI evaluated the patient and performed an EGD 01/10/18 which showed multiple non-bleeding ulcers. He was recommended to continue PPI BID. He received IV lasix 38m x1 prior to blood transfusion and home torsemide resumed at 42mdaily. I&Os with incomplete documentation. Cardiology consulted for assistance with acute on chronic diastolic CHF in the setting of acute on chronic renal insuffiency.   Past Medical History:  Diagnosis Date  . 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  . CMarland Kitchentaract    right eye - surgery to remove  . Elevated PSA 11/29/2016   Patient is followed by [facility].  Most recent PSA near 11.  He has had atypia on biopsy.  November 2008  . Essential hypertension, benign 06/08/2012  . Full dentures   . Glaucoma   . Heart murmur    since childhood, never has caused any problems  . History of rheumatic fever 11/04/2015  .  Hyperlipidemia 04/25/2013  . Hypertension   . Malignant neoplasm of left lung ([place]) 02/08/2017  . RBBB 07/12/2017  . Sickle cell trait ([place])    no problems per patient    Past Surgical History:  Procedure Laterality Date  . BIOPSY  11/11/2017   Procedure: BIOPSY;  Surgeon: Andrew Bullion, DO;  Location: [facility];  Service:  Gastroenterology;;  . BIOPSY  01/10/2018   Procedure: BIOPSY;  Surgeon: Andrew Park, MD;  Location: [place];  Service: Gastroenterology;;  . cataract eye surgery Right   . COLONOSCOPY  11/2009   hx polyps/[place]  . ESOPHAGOGASTRODUODENOSCOPY N/A 11/11/2017   Procedure: ESOPHAGOGASTRODUODENOSCOPY (EGD);  Surgeon: Andrew Bullion, DO;  Location: [facility];  Service: Gastroenterology;  Laterality: N/A;  . ESOPHAGOGASTRODUODENOSCOPY (EGD) WITH PROPOFOL N/A 01/10/2018   Procedure: ESOPHAGOGASTRODUODENOSCOPY (EGD) WITH PROPOFOL;  Surgeon: Andrew Park, MD;  Location: [place];  Service: Gastroenterology;  Laterality: N/A;  . PROSTATE BIOPSY    . right eye surgery     to lower eye pressure  . TONSILLECTOMY    . VIDEO ASSISTED THORACOSCOPY (VATS)/ LOBECTOMY Right 12/26/2015  . wisdom tteeth ext       Home Medications:  Prior to Admission medications   Medication Sig Start Date End Date Taking? Authorizing Provider  amLODipine (NORVASC) 2.5 MG tablet Take 1 tablet (2.5 mg total) by mouth daily. 11/07/17  Yes Andrew Drown, MD  aspirin EC 81 MG tablet Take 81 mg by mouth daily.   Yes [provider]  Brinzolamide-Brimonidine (SIMBRINZA) 1-0.2 % SUSP Place 2-3 drops into both eyes 3 (three) times daily.    Yes [provider]  budesonide-formoterol (SYMBICORT) 80-4.5 MCG/ACT inhaler Inhale 2 puffs into the lungs 2 (two) times daily. 11/01/17  Yes Andrew Drown, MD  metoprolol succinate (TOPROL-XL) 50 MG 24 hr tablet Take 1/2 tablet daily.Take with or immediately following a meal. 06/23/17  Yes Guerra, Andrew Snare, MD  Multiple Vitamin (MULTIVITAMIN) tablet Take 1 tablet by mouth daily.   Yes [provider]  Netarsudil-Latanoprost (ROCKLATAN) 0.02-0.005 % SOLN Place 1 drop into both eyes at bedtime.    Yes Andrew Pearson, MD  pantoprazole (PROTONIX) 40 MG tablet Take 1 tablet (40 mg total) by mouth 2 (two) times daily. 11/30/17 01/29/18 Yes Andrew Craze, NP  ketoconazole (NIZORAL) 2 % cream Apply to area twice daily Patient not taking: Reported on 01/09/2018 01/03/18   Andrew Drown, MD  torsemide [facility]) 20 MG tablet Take two tablets by mouth every morning Patient not taking: Reported on 01/09/2018 01/09/18   Andrew Drown, MD    Inpatient Medications: Scheduled Meds: . amLODipine  2.5 mg Oral Daily  . feeding supplement (ENSURE ENLIVE)  237 mL Oral BID BM  . metoprolol succinate  25 mg Oral Daily  . mometasone-formoterol  2 puff Inhalation BID  . Netarsudil-Latanoprost  1 drop Both Eyes QHS  . pantoprazole  40 mg Oral BID  . torsemide  40 mg Oral Daily   Continuous Infusions:  PRN Meds: acetaminophen **OR** acetaminophen, ondansetron **OR** ondansetron (ZOFRAN) IV  Allergies:    Allergies  Allergen Reactions  . Atorvastatin     Achy and tired    Social History:   Social History   Socioeconomic History  . Marital status: Married    Spouse name: Not on file  . Number of children: Not on file  . Years of education: Not on file  . Highest education level: Not on file  Occupational  History  . Not on file  Social Needs  . Financial resource strain: Not on file  . Food insecurity:    Worry: Not on file    Inability: Not on file  . Transportation needs:    Medical: Not on file    Non-medical: Not on file  Tobacco Use  . Smoking status: Former Smoker    Packs/day: 1.00    Types: Cigarettes    Last attempt to quit: 01/19/2011    Years since quitting: 6.9  . Smokeless tobacco: Never Used  Substance and Sexual Activity  . Alcohol use: Yes    Alcohol/week: 1.0 standard drinks    Types: 1 Shots of liquor per week  . Drug use: No  . Sexual activity: Not on file  Lifestyle  . Physical activity:    Days per week: Not on file    Minutes per session: Not on file  . Stress: Not on file  Relationships  . Social connections:    Talks on phone: Not on file    Gets together: Not on file    Attends  religious service: Not on file    Active member of club or organization: Not on file    Attends meetings of clubs or organizations: Not on file    Relationship status: Not on file  . Intimate partner violence:    Fear of current or ex partner: Not on file    Emotionally abused: Not on file    Physically abused: Not on file    Forced sexual activity: Not on file  Other Topics Concern  . Not on file  Social History Narrative  . Not on file    Family History:    Family History  Problem Relation Age of Onset  . Diabetes Mother   . Cerebral aneurysm Mother   . Lung cancer Father   . Diabetes Sister   . Colon cancer Neg Hx   . Colon polyps Neg Hx   . Esophageal cancer Neg Hx   . Rectal cancer Neg Hx   . Stomach cancer Neg Hx      ROS:  Please see the history of present illness.   All other ROS reviewed and negative.     Physical Exam/Data:   Vitals:   01/11/18 2213 01/12/18 0519 01/12/18 0802 01/12/18 1357  BP:  126/63  (!) 115/59  Pulse: 85 94 98 96  Resp: '16 18 18 ' (!) 24  Temp:  (!) 97.5 F (36.4 C)  98.3 F (36.8 C)  TempSrc:    Oral  SpO2: 95%  94% 95%  Weight:      Height:        Intake/Output Summary (Last 24 hours) at 01/12/2018 1552 Last data filed at 01/12/2018 0957 Gross per 24 hour  Intake 120 ml  Output 200 ml  Net -80 ml   Filed Weights   01/09/18 1633  Weight: 90.7 kg   Body mass index is 28.29 kg/m.  General:  Standing at bedside appearing moderately short of breath after just leaving the bathroom HEENT: sclera anicteric  Neck: no JVD Vascular: No carotid bruits Cardiac:  normal S1, S2; RRR; +murmur, no rubs or gallops Lungs:  clear to auscultation bilaterally, no wheezing, rhonchi or rales  Abd: NABS, soft, nontender, no hepatomegaly Ext: 3+ pitting edema to thighs Musculoskeletal:  No deformities, BUE and BLE strength normal and equal Skin: warm and dry  Neuro:  CNs 2-12 intact, no focal abnormalities noted Psych:  Normal  affect    EKG:  The EKG was personally reviewed and demonstrates:  Sinus rhythm with RBBB (chronic) and some baseline wander - no significant change from previous.  Telemetry:  Telemetry was personally reviewed and demonstrates:  Sinus rhythm with occasional PVCs.   Relevant CV Studies: Echocardiogram 10/2017: Study Conclusions  - Left ventricle: The cavity size was normal. Wall thickness was   increased in a pattern of moderate LVH. Systolic function was   normal. The estimated ejection fraction was in the range of 60%   to 65%. Wall motion was normal; there were no regional wall   motion abnormalities. Doppler parameters are consistent with   abnormal left ventricular relaxation (grade 1 diastolic   dysfunction). - Aortic valve: Moderately calcified annulus. Moderately thickened,   moderately calcified leaflets. 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 Valve   area (VTI): 1.66 cm^2. Valve area (Vmax): 1.75 cm^2. Valve area   (Vmean): 1.58 cm^2. - Right atrium: The atrium was mildly dilated. - Pulmonary arteries: Systolic pressure was moderately increased.   PA peak pressure: 56 mm Hg (S).   Laboratory Data:  Chemistry Recent Labs  Lab 01/09/18 1651 01/10/18 0406 01/12/18 0443  NA 138 137 138  K 3.6 3.3* 3.4*  CL 102 102 101  CO2 21* 21* 23  GLUCOSE 106* 103* 115*  BUN 76* 76* 79*  CREATININE 2.72* 2.48* 2.78*  CALCIUM 7.6* 7.6* 7.7*  GFRNONAA 21* 24* 21*  GFRAA 25* 28* 24*  ANIONGAP '15 14 14    ' Recent Labs  Lab 01/09/18 1204 01/09/18 1651  PROT 4.9* 4.7*  ALBUMIN 1.8* 1.6*  AST 54* 56*  ALT 30 33  ALKPHOS 80 82  BILITOT 0.4 0.4   Hematology Recent Labs  Lab 01/09/18 1204 01/09/18 1651 01/10/18 0406 01/10/18 1058 01/12/18 0443  WBC 22.0* 22.2*  --   --  26.3*  RBC 2.74* 2.78*  --   --  3.85*  HGB 6.4* 6.4* 7.9* 7.8* 9.1*  HCT 21.1* 21.7* 25.1* 24.2* 29.1*  MCV 77.0* 78.1*  --   --  75.6*  MCH 23.4* 23.0*  --   --  23.6*  MCHC 30.3 29.5*  --   --  31.3  RDW 22.0*  21.8*  --   --  21.0*  PLT 361 387  --   --  360   Cardiac EnzymesNo results for input(s): TROPONINI in the last 168 hours. No results for input(s): TROPIPOC in the last 168 hours.  BNP Recent Labs  Lab 01/09/18 1205  BNP 598.0*    DDimer No results for input(s): DDIMER in the last 168 hours.  Radiology/Studies:  Ct Abdomen Pelvis Wo Contrast  Result Date: 01/09/2018 CLINICAL DATA:  Low hemoglobin. EXAM: CT ABDOMEN AND PELVIS WITHOUT CONTRAST TECHNIQUE: Multidetector CT imaging of the abdomen and pelvis was performed following the standard protocol without IV contrast. COMPARISON:  CT 02/04/2015, abdomen MRI 12/09/2017 FINDINGS: Lower chest: Small bilateral pleural effusions. Cardiomegaly without pericardial effusion or thickening. Atherosclerosis of the included thoracic aorta without aneurysm. Coronary arteriosclerosis is seen. Pulmonary consolidation/atelectasis in the right middle lobe distribution. Hepatobiliary: Layering biliary sludge within the gallbladder without secondary signs of acute cholecystitis. The unenhanced liver demonstrates a granuloma in the right hepatic lobe. No definite mass or biliary dilatation. Pancreas: Unremarkable. No pancreatic ductal dilatation or surrounding inflammatory changes. Spleen: Normal size spleen with scattered calcifications noted within possibly representing vascular calcifications or granulomata. Adrenals/Urinary Tract: Scattered bilateral hyperdense lesions of the kidneys are nonspecific but may represent proteinaceous  or hemorrhagic cysts, the largest is right-sided and interpolar measuring up 1.6 cm with more ill-defined hyperdensity slightly exophytic off the right kidney. No nephrolithiasis or obstructive uropathy. The urinary bladder is unremarkable for the degree of distention. Stomach/Bowel: Stomach is within normal limits. The appendix is not confidently identified. No evidence of bowel wall thickening, distention, or inflammatory changes.  Vascular/Lymphatic: Aortic atherosclerosis. No lymphadenopathy. Reproductive: Prostatomegaly measuring up to 5.7 cm. Other: Diffuse soft tissue anasarca. No ascites or free air. 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 IMPRESSION: 1. Pulmonary consolidation/atelectasis in the right middle lobe with small bilateral pleural effusions. 2. Scattered bilateral hyperdense lesions of the kidneys likely to represent proteinaceous or hemorrhagic cysts, the largest is in the right interpolar and interpolar measuring 1.6 cm with more ill-defined hyperdensity slightly exophytic off the right kidney. Findings better characterized on recent prior MRI. 3. Hepatic and splenic granulomata. 4. Diffuse soft tissue anasarca. 5. 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 Aortic Atherosclerosis (ICD10-I70.0). Electronically Signed   By: Ashley Royalty M.D.   On: 01/09/2018 20:33   Dg Chest Portable 1 View  Result Date: 01/09/2018 CLINICAL DATA:  Shortness of breath for 1 day. 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 EXAM: PORTABLE CHEST 1 VIEW COMPARISON:  Radiographs 11/09/2017. Abdominal MRI 12/09/2017. PET-CT 11/25/2015. FINDINGS: 1902 hours. There are right-greater-than-left pleural effusions which have enlarged. There is associated bibasilar pulmonary opacity, likely atelectasis. There are stable postsurgical changes in the right hilar region. No pneumothorax or acute osseous findings are seen. IMPRESSION: Enlarging right-greater-than-left pleural effusions with associated bibasilar pulmonary opacities, likely atelectasis. No invert pulmonary edema. Electronically Signed   By: Richardean Sale M.D.   On: 01/09/2018 19:35    Assessment and Plan:   1. Acute on chronic diastolic CHF: CXR/ CT A/P with small bilateral pleural effusions without overt pulmonary edema. Also noted to have diffuse soft tissue anasarca on CT A/P. BNP elevated to 598 this admission; previously 270s 10/2017. Patient was given IV  lasix 66m x1 at the time of blood transfusions then resumed home torsemide (initiated by PCP 01/09/18 given poor response to lasix). His albumin is also quite low which is likely contributing to fluid overload. I&Os not accurately recorded. No daily weights on file. Unfortunately his Cr continues to rise, up to 2.78 today (baseline around 1.7) limiting diuresis.  - Will check a limited echo to assess LV function - Will transition to IV lasix 868mBID in an effort to improve LE edema - Will give albumin chaser after lasix dose in an effort to combat hypoalbuminemia - Will transition from metoprolol to carvedilol 6.2598mID - titrate as tolerated  2. HTN: BP stable. Hydralazine discontinued by PCP 01/09/18. - Will stop his amlodipine which could be contributing to LE edema - Will transition from metoprolol to carvedilol for BP control   3. Moderate aortic stenosis: noted to be moderate on echo 10/2015 with mean gradient 53m2m He was recommended for repeat echo in 1 year for close monitoring. - Will check limited echo to assess Aortic valve   4. Acute on chronic anemia in the setting of GI bleed: Hgb 6.4 on admission, improved to 9.1 today after receiving 2 uPRBC's. GI performed EGD which revealed multiple non-bleeding ulcers. On PPI BID.  - Continue management per primary team/GI  5. Acute on chronic renal insufficiency stage 3: Cr up to 2.78 today; baseline around 1.7.  - May need to get nephrology involved if Cr continues to uptrend - continue  to monitor closely with diuresis  6. Leukocytosis: WBC uptrending to 26.3. No clear infectious source and doubtful elevation is reactive to anemia. He is noted to have right middle lobe consolidation/atelectasis - possible this is PNA and may explain SOB? Also with weight loss - ?malignancy as well - Will defer work-up to primary team - could consider CT chest to further evaluate pulmonary findings   7. History of NSCLC: s/p surgery and radiation.  Follows at [facility] q73mo  - Continue routine outpatient follow-up with oncology.   8. Hypoalbuminemia: Albumin 1.6 this admission. Reports decreased po intake with his PUD. Likely contributing to #1 - Will give albumin after lasix doses  9. 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 Scheduled to see hematology outpatient but not until 03/2018 - Could consider hematology consult given recurrent iron deficiency anemia requiring transfusions and concern for iron overload - will defer to primary team.    For questions or updates, please contact [facility] Please consult [email or web address] for contact info under Cardiology/STEMI.   Signed, KAbigail Butts PA-C  01/12/2018 3:52 PM [phone]

## 2018-01-12 NOTE — Care Management Important Message (Signed)
Important Message  Patient Details  Name: Andrew Guerra MRN: [id number] Date of Birth: 02-07-1940   Medicare Important Message Given:  Yes    Avleen Bordwell P Samhitha Rosen 01/12/2018, 5:02 PM

## 2018-01-12 NOTE — Progress Notes (Signed)
Patient Demographics:    Andrew Guerra, is a 77 y.o. male, DOB - [birth year], VCB:[id number]  Admit date - 01/09/2018   Admitting Physician [name], DO  Outpatient Primary MD for the patient is [name], MD  LOS - 3   Chief Complaint  Patient presents with  . 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 male with a PMH of HTN, HLD, bicuspid aortic valve with moderate AS, NSCLC s/p resection and radiation, and PUD admitted from PCPs office on 01/09/2018 with hemoglobin of 6.4  Plan:- 1)Acute Gi Bleed--- status post EGD on 01/10/2018 with nonbleeding gastric and duodenal ulcers, biopsies are pending, continue PPI twice daily, monitor H&H and transfuse as clinically indicated, hemoglobin is up to 9.1 posttransfusion of 2 units of packed cells this admission  2)HFpEF--acute on chronic diastolic dysfunction CHF exacerbation, last known EF 60 to 65% with moderate aortic stenosis... 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 Scheduled to see hematology outpatient but not until 03/2018--- be judicious with blood transfusion  7) Moderate aortic stenosis--- most likely contributing to #2 above, repeat echo pending  8)FEN--poor oral intake, weight loss and low albumin----nutritional supplements advised, patient albumin has been less than 2, will need IV albumin with IV diuretics  9)NSCLC - post prior surgery and radiation, patient goes to [place] every 6 months Looks like patient had this surgically resected, then had recurrence followed by SBRT with shrinking of the recurrent tumor according to oncology office visit notes.  Disposition/Need for in-Hospital Stay- patient unable to be discharged at this time due to significant worsening of heart failure and volume overload requiring further diuresis  and monitoring (see subjective section above)  Code Status : Full  Family Communication:   Wife not at bedside   Disposition Plan  : To be determined  Consults  : Cardiology/GI..... Consider hematology consult  DVT Prophylaxis  :  TEDS/ SCDs   Lab Results  Component Value Date   PLT 360 01/12/2018    Inpatient Medications  Scheduled Meds: . carvedilol  6.25 mg Oral BID WC  . feeding supplement (ENSURE ENLIVE)  237 mL Oral BID BM  . furosemide  80 mg  Intravenous BID  . mometasone-formoterol  2 puff Inhalation BID  . Netarsudil-Latanoprost  1 drop Both Eyes QHS  . pantoprazole  40 mg Oral BID  . potassium chloride  40 mEq Oral Once   Continuous Infusions: . albumin human     PRN Meds:.acetaminophen **OR** acetaminophen, ondansetron **OR** ondansetron (ZOFRAN) IV    Anti-infectives (From admission, onward)   None        Objective:   Vitals:   01/11/18 2213 01/12/18 0519 01/12/18 0802 01/12/18 1357  BP:  126/63  (!) 115/59  Pulse: 85 94 98 96  Resp: 16 18 18  (!) 24  Temp:  (!) 97.5 F (36.4 C)  98.3 F (36.8 C)  TempSrc:    Oral  SpO2: 95%  94% 95%  Weight:      Height:        Wt Readings from Last 3 Encounters:  01/09/18 90.7 kg  01/09/18 99.7 kg  12/14/17 88.5 kg     Intake/Output Summary (Last 24 hours) at 01/12/2018 1856 Last data filed at 01/12/2018 1658 Gross per 24 hour  Intake 180 ml  Output 350 ml  Net -170 ml     Physical Exam Patient is examined daily including today on 01/12/18  , exams remain the same as of yesterday except that has changed   Gen:- Awake Alert, conversational dyspnea, HEENT:- [place].AT, No sclera icterus Neck-Supple Neck,No JVD,.  Lungs-diminished in bases scattered rales, no wheezing CV- S1, S2 normal, regular 3/6 SM Abd-  +ve B.Sounds, Abd Soft, No tenderness,    Extremity/Skin:-3+ edema/anasarca extending from the toes into the lower abdomen with significant involvement of the penis and scrotum, has Thigh High TEDs, pedal pulses present  Psych-affect is appropriate, oriented x3 Neuro-no new focal deficits, no tremors GU--- patient with significant scrotal and penile edema,   Data Review:   Micro Results No results found for this or any previous visit (from the past 240 hour(s)).  Radiology Reports Ct Abdomen Pelvis Wo Contrast  Result Date: 01/09/2018 CLINICAL DATA:  Low hemoglobin. EXAM: CT ABDOMEN AND PELVIS WITHOUT CONTRAST TECHNIQUE: Multidetector CT imaging of  the abdomen and pelvis was performed following the standard protocol without IV contrast. COMPARISON:  CT 02/04/2015, abdomen MRI 12/09/2017 FINDINGS: Lower chest: Small bilateral pleural effusions. Cardiomegaly without pericardial effusion or thickening. Atherosclerosis of the included thoracic aorta without aneurysm. Coronary arteriosclerosis is seen. Pulmonary consolidation/atelectasis in the right middle lobe distribution. Hepatobiliary: Layering biliary sludge within the gallbladder without secondary signs of acute cholecystitis. The unenhanced liver demonstrates a granuloma in the right hepatic lobe. No definite mass or biliary dilatation. Pancreas: Unremarkable. No pancreatic ductal dilatation or surrounding inflammatory changes. Spleen: Normal size spleen with scattered calcifications noted within possibly representing vascular calcifications or granulomata. Adrenals/Urinary Tract: Scattered bilateral hyperdense lesions of the kidneys are nonspecific but may represent proteinaceous or hemorrhagic cysts, the largest is right-sided and interpolar measuring up 1.6 cm with more ill-defined hyperdensity slightly exophytic off  the right kidney. No nephrolithiasis or obstructive uropathy. The urinary bladder is unremarkable for the degree of distention. Stomach/Bowel: Stomach is within normal limits. The appendix is not confidently identified. No evidence of bowel wall thickening, distention, or inflammatory changes. Vascular/Lymphatic: Aortic atherosclerosis. No lymphadenopathy. Reproductive: Prostatomegaly measuring up to 5.7 cm. Other: Diffuse soft tissue anasarca. No ascites or free air. 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 IMPRESSION: 1. Pulmonary consolidation/atelectasis in the right middle lobe with small bilateral pleural effusions. 2. Scattered bilateral hyperdense lesions of the kidneys likely to represent proteinaceous or hemorrhagic cysts, the largest is in the right interpolar  and interpolar measuring 1.6 cm with more ill-defined hyperdensity slightly exophytic off the right kidney. Findings better characterized on recent prior MRI. 3. Hepatic and splenic granulomata. 4. Diffuse soft tissue anasarca. 5. 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 Aortic Atherosclerosis (ICD10-I70.0). Electronically Signed   By: Ashley Royalty M.D.   On: 01/09/2018 20:33   Dg Chest Portable 1 View  Result Date: 01/09/2018 CLINICAL DATA:  Shortness of breath for 1 day. 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 EXAM: PORTABLE CHEST 1 VIEW COMPARISON:  Radiographs 11/09/2017. Abdominal MRI 12/09/2017. PET-CT 11/25/2015. FINDINGS: 1902 hours. There are right-greater-than-left pleural effusions which have enlarged. There is associated bibasilar pulmonary opacity, likely atelectasis. There are stable postsurgical changes in the right hilar region. No pneumothorax or acute osseous findings are seen. IMPRESSION: Enlarging right-greater-than-left pleural effusions with associated bibasilar pulmonary opacities, likely atelectasis. No invert pulmonary edema. Electronically Signed   By: Richardean Sale M.D.   On: 01/09/2018 19:35     CBC Recent Labs  Lab 01/09/18 1204 01/09/18 1651 01/10/18 0406 01/10/18 1058 01/12/18 0443  WBC 22.0* 22.2*  --   --  26.3*  HGB 6.4* 6.4* 7.9* 7.8* 9.1*  HCT 21.1* 21.7* 25.1* 24.2* 29.1*  PLT 361 387  --   --  360  MCV 77.0* 78.1*  --   --  75.6*  MCH 23.4* 23.0*  --   --  23.6*  MCHC 30.3 29.5*  --   --  31.3  RDW 22.0* 21.8*  --   --  21.0*  LYMPHSABS 0.5*  --   --   --   --   MONOABS 1.2*  --   --   --   --   EOSABS 0.0  --   --   --   --   BASOSABS 0.0  --   --   --   --     Chemistries  Recent Labs  Lab 01/09/18 1204 01/09/18 1651 01/10/18 0406 01/12/18 0443  NA 136 138 137 138  K 3.0* 3.6 3.3* 3.4*  CL 104 102 102 101  CO2 21* 21* 21* 23  GLUCOSE 107* 106* 103* 115*  BUN 78* 76* 76* 79*  CREATININE 2.60* 2.72* 2.48* 2.78*  CALCIUM 7.5*  7.6* 7.6* 7.7*  AST 54* 56*  --   --   ALT 30 33  --   --   ALKPHOS 80 82  --   --   BILITOT 0.4 0.4  --   --    ------------------------------------------------------------------------------------------------------------------ No results for input(s): CHOL, HDL, LDLCALC, TRIG, CHOLHDL, LDLDIRECT in the last 72 hours.  No results found for: HGBA1C ------------------------------------------------------------------------------------------------------------------ No results for input(s): TSH, T4TOTAL, T3FREE, THYROIDAB in the last 72 hours.  Invalid input(s): FREET3 ------------------------------------------------------------------------------------------------------------------ No results for input(s): VITAMINB12, FOLATE, FERRITIN, TIBC, IRON, RETICCTPCT in the last 72 hours.  Coagulation profile Recent Labs  Lab 01/09/18 1651  INR 1.21    No results for input(s): DDIMER in the last 72 hours.  Cardiac Enzymes No results for input(s): CKMB, TROPONINI, MYOGLOBIN in the last 168 hours.  Invalid input(s): CK ------------------------------------------------------------------------------------------------------------------    Component Value Date/Time   BNP 598.0 (H) 01/09/2018 1205     Roxan Hockey M.D on 01/12/2018 at 6:56 PM  Pager---[phone] Go to [email or web address] - password [email or web address] for contact info  [facility] - Office  [phone]

## 2018-01-12 NOTE — Telephone Encounter (Signed)
Nurses I believe that this message is coming from Jim's wife-Andrew Guerra Please call her let her know that it is doubtful that something she is eating gave her the same problem that Clair Gulling is having And also if she is having dark stools she ought to get this checked out by her doctor to be on the safe side

## 2018-01-12 NOTE — Telephone Encounter (Signed)
Left message to return call 

## 2018-01-12 NOTE — Telephone Encounter (Signed)
Called and discussed with pt's wife. She verbalized understanding and states she will call her doctor.

## 2018-01-12 NOTE — Progress Notes (Signed)
Daily Rounding Note  01/12/2018, 12:53 PM  LOS: 3 days   SUBJECTIVE:   Chief complaint: gastric, duodenal ulcers.  Anemia.      Regular diet.   Stool yesterday dark.    OBJECTIVE:         Vital signs in last 24 hours:    Temp:  [97.5 F (36.4 C)-98.4 F (36.9 C)] 97.5 F (36.4 C) (12/26 0519) Pulse Rate:  [85-98] 98 (12/26 0802) Resp:  [16-18] 18 (12/26 0802) BP: (125-126)/(56-67) 126/63 (12/26 0519) SpO2:  [76 %-95 %] 94 % (12/26 0802) FiO2 (%):  [36 %] 36 % (12/26 0802) Last BM Date: 01/11/18 Filed Weights   01/09/18 1633  Weight: 90.7 kg   General: Looks ill, tired   Heart: rate in 90s, regular, loud murmer Chest: clear, seems SOB with speaking though he denies feeling SOB Abdomen: soft, obese, NT  Extremities: massive LE and scrotal edema Neuro/Psych:  Oriented x 3.    Intake/Output from previous day: 12/25 0701 - 12/26 0700 In: -  Out: 200 [Urine:200]  Intake/Output this shift: Total I/O In: 120 [P.O.:120] Out: -   Lab Results: Recent Labs    01/09/18 1651 01/10/18 0406 01/10/18 1058 01/12/18 0443  WBC 22.2*  --   --  26.3*  HGB 6.4* 7.9* 7.8* 9.1*  HCT 21.7* 25.1* 24.2* 29.1*  PLT 387  --   --  360   BMET Recent Labs    01/09/18 1651 01/10/18 0406 01/12/18 0443  NA 138 137 138  K 3.6 3.3* 3.4*  CL 102 102 101  CO2 21* 21* 23  GLUCOSE 106* 103* 115*  BUN 76* 76* 79*  CREATININE 2.72* 2.48* 2.78*  CALCIUM 7.6* 7.6* 7.7*   LFT Recent Labs    01/09/18 1651  PROT 4.7*  ALBUMIN 1.6*  AST 56*  ALT 33  ALKPHOS 82  BILITOT 0.4   PT/INR Recent Labs    01/09/18 1651  LABPROT 15.2  INR 1.21   Scheduled Meds: . amLODipine  2.5 mg Oral Daily  . feeding supplement (ENSURE ENLIVE)  237 mL Oral BID BM  . metoprolol succinate  25 mg Oral Daily  . mometasone-formoterol  2 puff Inhalation BID  . Netarsudil-Latanoprost  1 drop Both Eyes QHS  . pantoprazole  40 mg Oral BID  .  torsemide  40 mg Oral Daily   Continuous Infusions: PRN Meds:.acetaminophen **OR** acetaminophen, ondansetron **OR** ondansetron (ZOFRAN) IV   ASSESMENT:   *    Acute on chronic anemia.  Not significantly symptomatic, DOE stable.  Received 2 U PRBCs thus far.  Microcytic. Hgb much improved, 6.4 >> 9.1.      *    GI bleed and anemia from duodenal and gastric ulcers 10/2017.  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  Completed antibiotic therapy for H pylori, continues on twice daily Protonix. 12/24 EGD: esophagitis, non-bleeding gastric ulcers were biopsied.  Non-bleeding duodenal ulcers also biopsied.     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   Iron low, TIBC low, iron sat low.  Ferritin 348.  Microcytic.    *    Benign duodenal nodule  *    Anasarca, increased LE edema and new scrotal edema.  Low albumin.  Not clear what is causing this, poor nutrition/protein intake?[name]    *     Acute on chronic kidney disease.  This may be contributing to the anemia.  PLAN   *   Awaiting gastric and duodenal path reports. Continue BID oral PPI.       Andrew Guerra  01/12/2018, 12:53 PM Phone [phone]

## 2018-01-13 ENCOUNTER — Inpatient Hospital Stay (HOSPITAL_COMMUNITY): Payer: Medicare Other

## 2018-01-13 ENCOUNTER — Other Ambulatory Visit: Payer: Self-pay

## 2018-01-13 DIAGNOSIS — K31A0 Gastric intestinal metaplasia, unspecified: Secondary | ICD-10-CM

## 2018-01-13 DIAGNOSIS — K3189 Other diseases of stomach and duodenum: Secondary | ICD-10-CM

## 2018-01-13 DIAGNOSIS — I361 Nonrheumatic tricuspid (valve) insufficiency: Secondary | ICD-10-CM

## 2018-01-13 LAB — ECHOCARDIOGRAM LIMITED
Height: 70.5 in
Weight: 3200 oz

## 2018-01-13 LAB — COMPREHENSIVE METABOLIC PANEL
ALT: 41 U/L (ref 0–44)
AST: 40 U/L (ref 15–41)
Albumin: 1.6 g/dL — ABNORMAL LOW (ref 3.5–5.0)
Alkaline Phosphatase: 92 U/L (ref 38–126)
Anion gap: 13 (ref 5–15)
BUN: 89 mg/dL — AB (ref 8–23)
CO2: 23 mmol/L (ref 22–32)
Calcium: 7.8 mg/dL — ABNORMAL LOW (ref 8.9–10.3)
Chloride: 100 mmol/L (ref 98–111)
Creatinine, Ser: 2.96 mg/dL — ABNORMAL HIGH (ref 0.61–1.24)
GFR calc Af Amer: 22 mL/min — ABNORMAL LOW (ref >60)
GFR calc non Af Amer: 19 mL/min — ABNORMAL LOW (ref >60)
Glucose, Bld: 112 mg/dL — ABNORMAL HIGH (ref 70–99)
Potassium: 3.8 mmol/L (ref 3.5–5.1)
Sodium: 136 mmol/L (ref 135–145)
Total Bilirubin: 0.5 mg/dL (ref 0.3–1.2)
Total Protein: 4.7 g/dL — ABNORMAL LOW (ref 6.5–8.1)

## 2018-01-13 LAB — PREPARE RBC (CROSSMATCH)

## 2018-01-13 LAB — CBC
HCT: 26.7 % — ABNORMAL LOW (ref 39.0–52.0)
Hemoglobin: 8.4 g/dL — ABNORMAL LOW (ref 13.0–17.0)
MCH: 23.5 pg — ABNORMAL LOW (ref 26.0–34.0)
MCHC: 31.5 g/dL (ref 30.0–36.0)
MCV: 74.6 fL — ABNORMAL LOW (ref 80.0–100.0)
Platelets: 340 10*3/uL (ref 150–400)
RBC: 3.58 MIL/uL — ABNORMAL LOW (ref 4.22–5.81)
RDW: 20.9 % — ABNORMAL HIGH (ref 11.5–15.5)
WBC: 23.8 10*3/uL — ABNORMAL HIGH (ref 4.0–10.5)
nRBC: 0.3 % — ABNORMAL HIGH (ref 0.0–0.2)

## 2018-01-13 LAB — URINALYSIS, ROUTINE W REFLEX MICROSCOPIC
Bacteria, UA: NONE SEEN
Bilirubin Urine: NEGATIVE
Glucose, UA: NEGATIVE mg/dL
Hgb urine dipstick: NEGATIVE
Ketones, ur: NEGATIVE mg/dL
Leukocytes, UA: NEGATIVE
Nitrite: NEGATIVE
Protein, ur: 100 mg/dL — AB
SPECIFIC GRAVITY, URINE: 1.014 (ref 1.005–1.030)
pH: 5 (ref 5.0–8.0)

## 2018-01-13 LAB — PROTEIN / CREATININE RATIO, URINE
Creatinine, Urine: 160.67 mg/dL
Protein Creatinine Ratio: 1.49 mg/mg{Cre} — ABNORMAL HIGH (ref 0.00–0.15)
Total Protein, Urine: 240 mg/dL

## 2018-01-13 MED ORDER — FUROSEMIDE 10 MG/ML IJ SOLN
60.0000 mg | Freq: Once | INTRAMUSCULAR | Status: AC
  Administered 2018-01-13: 60 mg via INTRAVENOUS
  Filled 2018-01-13: qty 6

## 2018-01-13 MED ORDER — SODIUM CHLORIDE 0.9% IV SOLUTION
Freq: Once | INTRAVENOUS | Status: AC
  Administered 2018-01-13: 500 mL via INTRAVENOUS

## 2018-01-13 MED ORDER — TAMSULOSIN HCL 0.4 MG PO CAPS
0.4000 mg | ORAL_CAPSULE | Freq: Every day | ORAL | Status: DC
  Administered 2018-01-13 – 2018-01-14 (×2): 0.4 mg via ORAL
  Filled 2018-01-13 (×2): qty 1

## 2018-01-13 NOTE — Progress Notes (Signed)
  Echocardiogram 2D Echocardiogram has been performed.  Andrew Guerra 01/13/2018, 10:38 AM

## 2018-01-13 NOTE — Progress Notes (Signed)
[facility] Progress Note  CC:  My stools are still dark  Assessment / Plan: PUD: Gastric and duodenal ulcers    - initially diagnosed as source of bleeding 10/2017     - H pylori + 10/2017       - treated with bismuth, metronidazole, doxycycline, PPI       - subsequent gastric biopsies 01/10/18 negative for H pylori  Intestinal metaplasia    - intestinal metaplasia on biopsies 10/10 and after H pylori irradication       - patient not at high risk for gastric cancer, no family history of stomach cancer       - no surveillance indicated based on AGA guidelines LA class A reflux esophagitis on EGD 01/10/18 Acute on chronic anemia    - received 2 units of PRBCs prior to today Acute on chronic kidney disease History of colon polyp: serrated polyp 2011, none 2017    - excellent prep noted on 2017 colonoscopy Moderate aortic stenosis due to bicuspid aortic valve  Suspected hemachromatosis based on MRI findings from 12/09/17 NSCLC s/p resection and radiation Hypoalbuminemia  Reviewed recent biopsy results with the patient, including the diagnosis of intestinal metaplasia. No new recommendations based on those results.   Hemoglobin declined today, unless yesterday was a spurious results and it is actually overall stable. As there was no bleeding noted at the time of his EGD, and he carries a diagnosis of aortic stenosis, I am concerned about the possibility of small bowel AVMs.   I will have a low threshold to proceed with additional GI evaluation if there is progressive GI blood loss anemia. Unfortunately, he will likely need a colonoscopy before insurance would approve a capsule endoscopy for this indication.  Continue PPI BID x 2 weeks, then daily.   Dr. Collene Mares will see the patient this weekend on behalf of the GI service. Please call with any questions or concerns in the meantime.   Subjective: Continues to have dark stools. They have not been brown since October.  Receiving additional PRBCs today.  GI ROS is otherwise negative.  Wife present at the bedside  Objective:  Vital signs in last 24 hours: Temp:  [97.9 F (36.6 C)-98.3 F (36.8 C)] 98.2 F (36.8 C) (12/27 0518) Pulse Rate:  [84-96] 84 (12/27 0518) Resp:  [18-24] 18 (12/27 0518) BP: (115-123)/(59-70) 123/70 (12/27 0518) SpO2:  [95 %-97 %] 97 % (12/27 0518) Last BM Date: 01/11/18 General:   Alert, in NAD, sitting in the bedside chair. Chronically ill appearing. Looks older than his state age. Speaking in short phrases. Heart:  Regular rate and rhythm; no murmurs Pulm: Clear anteriorly; no wheezing Abdomen:  Soft. Nontender. Nondistended. Normal bowel sounds. No rebound or guarding. Extremities: Massive LE edema. Persistent scrotal edema.  Neurologic:  Alert and  oriented x4;  grossly normal neurologically. Psych:  Alert and cooperative. Normal mood and affect.  Intake/Output from previous day: 12/26 0701 - 12/27 0700 In: 220.7 [P.O.:180; IV Piggyback:40.7] Out: 150 [Urine:150] Intake/Output this shift: Total I/O In: 230 [P.O.:180; IV Piggyback:50] Out: 300 [Urine:300]  Lab Results: Recent Labs    01/12/18 0443 01/13/18 0354  WBC 26.3* 23.8*  HGB 9.1* 8.4*  HCT 29.1* 26.7*  PLT 360 340   BMET Recent Labs    01/12/18 0443 01/13/18 0354  NA 138 136  K 3.4* 3.8  CL 101 100  CO2 23 23  GLUCOSE 115* 112*  BUN 79* 89*  CREATININE 2.78* 2.96*  CALCIUM 7.7* 7.8*   LFT Recent Labs    01/13/18 0354  PROT 4.7*  ALBUMIN 1.6*  AST 40  ALT 41  ALKPHOS 92  BILITOT 0.5   PT/INR No results for input(s): LABPROT, INR in the last 72 hours. Hepatitis Panel No results for input(s): HEPBSAG, HCVAB, HEPAIGM, HEPBIGM in the last 72 hours.  US Renal  Result Date: 01/13/2018 CLINICAL DATA:  Oliguria.  Elevated PSA. EXAM: RENAL / URINARY TRACT ULTRASOUND COMPLETE COMPARISON:  CT abdomen and pelvis 01/09/2018. FINDINGS: Right Kidney: Renal measurements: 10.5 x 4.6 x 5.0 cm =  volume: 126.8 mL . Echogenicity within normal limits. A simple cyst measuring 1.9 cm in diameter is identified. No hydronephrosis visualized. Trace amount of perihepatic ascites is noted. Left Kidney: Renal measurements: 11.4 x 5.5 x 5.1 cm = volume: 163.2 mL. Echogenicity within normal limits. No mass or hydronephrosis visualized. Bladder: The bladder is almost completely decompressed. No focal abnormality. Prostatomegaly is seen. IMPRESSION: Negative for hydronephrosis or acute abnormality. Prostatomegaly. Trace amount of ascites. Electronically Signed   By: Inge Rise M.D.   On: 01/13/2018 11:29   [name]  01/13/2018, 1:32 PM

## 2018-01-13 NOTE — Progress Notes (Signed)
DAILY PROGRESS NOTE   Patient Name: Andrew Guerra Date of Encounter: 01/13/2018  Chief Complaint   Shortness of breath  Patient Profile   ALECXANDER Guerra is a 77 y.o. male with a PMH of HTN, HLD, bicuspid aortic valve with moderate AS, NSCLC s/p resection and radiation, and PUD, who is being seen today for the evaluation of CHF at the request of Dr. Denton Brick.  Subjective   Minimal diuresis overnight with lasix and albumin. Creatinine continues to rise. Hemoglobin stable. Complaining of what sounds like tenesmus - feels the urge to defecate when urinating all the time. Stools still black - GI signed off.   Objective   Vitals:   01/12/18 0802 01/12/18 1357 01/12/18 2114 01/13/18 0518  BP:  (!) 115/59 118/70 123/70  Pulse: 98 96 95 84  Resp: 18 (!) 24  18  Temp:  98.3 F (36.8 C) 97.9 F (36.6 C) 98.2 F (36.8 C)  TempSrc:  Oral    SpO2: 94% 95% 95% 97%  Weight:      Height:        Intake/Output Summary (Last 24 hours) at 01/13/2018 0177 Last data filed at 01/13/2018 9390 Gross per 24 hour  Intake 450.73 ml  Output 350 ml  Net 100.73 ml   Filed Weights   01/09/18 1633  Weight: 90.7 kg    Physical Exam   General appearance: alert and no distress Neck: no carotid bruit, no JVD and thyroid not enlarged, symmetric, no tenderness/mass/nodules Lungs: diminished breath sounds bibasilar Heart: regular rate and rhythm, S1, S2 normal and systolic murmur: systolic ejection 3/6, crescendo at 2nd right intercostal space Abdomen: soft, non-tender; bowel sounds normal; no masses,  no organomegaly Extremities: edema 3+ edema Pulses: 2+ and symmetric Skin: Skin color, texture, turgor normal. No rashes or lesions Neurologic: Grossly normal Psych: Pleasant  Inpatient Medications    Scheduled Meds: . carvedilol  6.25 mg Oral BID WC  . feeding supplement (ENSURE ENLIVE)  237 mL Oral BID BM  . furosemide  80 mg Intravenous BID  . mouth rinse  15 mL Mouth Rinse BID  .  mometasone-formoterol  2 puff Inhalation BID  . multivitamin with minerals  1 tablet Oral Daily  . Netarsudil-Latanoprost  1 drop Both Eyes QHS  . pantoprazole  40 mg Oral BID    Continuous Infusions: . albumin human 12.5 g (01/13/18 0852)    PRN Meds: acetaminophen **OR** acetaminophen, ondansetron **OR** ondansetron (ZOFRAN) IV   Labs   Results for orders placed or performed during the hospital encounter of 01/09/18 (from the past 48 hour(s))  CBC     Status: Abnormal   Collection Time: 01/12/18  4:43 AM  Result Value Ref Range   WBC 26.3 (H) 4.0 - 10.5 K/uL   RBC 3.85 (L) 4.22 - 5.81 MIL/uL   Hemoglobin 9.1 (L) 13.0 - 17.0 g/dL   HCT 29.1 (L) 39.0 - 52.0 %   MCV 75.6 (L) 80.0 - 100.0 fL   MCH 23.6 (L) 26.0 - 34.0 pg   MCHC 31.3 30.0 - 36.0 g/dL   RDW 21.0 (H) 11.5 - 15.5 %   Platelets 360 150 - 400 K/uL   nRBC 0.2 0.0 - 0.2 %    Comment: Performed at [facility], [address], [place], [place] [address]  Basic metabolic panel     Status: Abnormal   Collection Time: 01/12/18  4:43 AM  Result Value Ref Range   Sodium 138 135 -  145 mmol/L   Potassium 3.4 (L) 3.5 - 5.1 mmol/L   Chloride 101 98 - 111 mmol/L   CO2 23 22 - 32 mmol/L   Glucose, Bld 115 (H) 70 - 99 mg/dL   BUN 79 (H) 8 - 23 mg/dL   Creatinine, Ser 2.78 (H) 0.61 - 1.24 mg/dL   Calcium 7.7 (L) 8.9 - 10.3 mg/dL   GFR calc non Af Amer 21 (L) >60 mL/min   GFR calc Af Amer 24 (L) >60 mL/min   Anion gap 14 5 - 15    Comment: Performed at [facility] [address], [place], [place] [address]  CBC     Status: Abnormal   Collection Time: 01/13/18  3:54 AM  Result Value Ref Range   WBC 23.8 (H) 4.0 - 10.5 K/uL   RBC 3.58 (L) 4.22 - 5.81 MIL/uL   Hemoglobin 8.4 (L) 13.0 - 17.0 g/dL    Comment: Reticulocyte Hemoglobin testing may be clinically indicated, consider ordering this additional test [id number]    HCT 26.7 (L) 39.0 - 52.0 %   MCV 74.6 (L) 80.0 - 100.0 fL   MCH 23.5 (L) 26.0 - 34.0 pg    MCHC 31.5 30.0 - 36.0 g/dL   RDW 20.9 (H) 11.5 - 15.5 %   Platelets 340 150 - 400 K/uL   nRBC 0.3 (H) 0.0 - 0.2 %    Comment: Performed at [facility], [place] [address], [place], [place] [address]  Comprehensive metabolic panel     Status: Abnormal   Collection Time: 01/13/18  3:54 AM  Result Value Ref Range   Sodium 136 135 - 145 mmol/L   Potassium 3.8 3.5 - 5.1 mmol/L   Chloride 100 98 - 111 mmol/L   CO2 23 22 - 32 mmol/L   Glucose, Bld 112 (H) 70 - 99 mg/dL   BUN 89 (H) 8 - 23 mg/dL   Creatinine, Ser 2.96 (H) 0.61 - 1.24 mg/dL   Calcium 7.8 (L) 8.9 - 10.3 mg/dL   Total Protein 4.7 (L) 6.5 - 8.1 g/dL   Albumin 1.6 (L) 3.5 - 5.0 g/dL   AST 40 15 - 41 U/L   ALT 41 0 - 44 U/L   Alkaline Phosphatase 92 38 - 126 U/L   Total Bilirubin 0.5 0.3 - 1.2 mg/dL   GFR calc non Af Amer 19 (L) >60 mL/min   GFR calc Af Amer 22 (L) >60 mL/min   Anion gap 13 5 - 15    Comment: Performed at [facility], [place] [address] [place], [place] [address]    ECG   N/A  Telemetry   Sinus rhythm - Personally Reviewed  Radiology    No results found.  Cardiac Studies   Limited echo pending  Assessment   1. Principal Problem: 2.   GI bleed 3. Active Problems: 4.   Essential hypertension, benign 5.   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   Malignant neoplasm of left lung ([place]) 7.   Acute on chronic diastolic CHF (congestive heart failure) ([place]) 8.   Malnutrition of moderate degree 9.   Iron deficiency anemia 10.   Anasarca 11.   Plan   1. Minimal diuresis overnight- main issue is third-spacing - hypoalbuminemia. Will need nephrology involvement - consider transfusion 2U PRBC's for additional colloid.  Time Spent Directly with Patient:  I have spent a total of 25 minutes with the patient reviewing hospital notes, telemetry, EKGs,  labs and examining the patient as well as establishing an assessment and plan that was discussed personally with the patient.  > 50% of  time was spent in direct patient care.  Length of Stay:  LOS: 4 days   Pixie Casino, MD, [facility], [place] Director of the [facility] Lip[facility] &  Cardiovascular Risk [facility] Diplomate of the American Board of Clinical Lipidology Attending Cardiologist  Direct Dial: [phone]  Fax: [phone]  Website:  [email or web address]  Nadean Corwin Hilty 01/13/2018, 9:22 AM

## 2018-01-13 NOTE — Consult Note (Signed)
[facility] Renal Consultation Note  Requesting MD: Roxan Hockey, MD Indication for Consultation:  AKI    HPI:  Andrew Guerra is a 77 y.o. male with a history of HTN and aortic stenosis who presented with GI bleed and hemoglobin 6.5 with PCP and report of black stool.  Found also with fluid overload/anasarca and CHF.  He has been evaluated by cardiology and has received lasix BID; only had 150 mL UOP charted on 12/26.  Work-up thus far notable for renal ultrasound without hydro and with trace abdominal ascites and prostatomegaly.  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    Spoke with patient as well a his wife and daughter.  He reports that his shortness of breath and swelling have worsened since around October of this year.  He reports that he had been on lasix daily for a couple of weeks prior to admission and had just been changed to torsemide when admitted.  He does have some urinary hesitancy and weak stream.  He has followed with urology previously for elevated PSA (which he reports to be around 7) but has not seen nephrology.  He denies NSAID use.  He would want dialysis if indicated.      Creat  Date/Time Value Ref Range Status  04/09/2013 04:39 PM 1.33 0.50 - 1.35 mg/dL Final  12/04/2012 09:03 AM 1.39 (H) 0.50 - 1.35 mg/dL Final   Creatinine, Ser  Date/Time Value Ref Range Status  01/13/2018 03:54 AM 2.96 (H) 0.61 - 1.24 mg/dL Final  01/12/2018 04:43 AM 2.78 (H) 0.61 - 1.24 mg/dL Final  01/10/2018 04:06 AM 2.48 (H) 0.61 - 1.24 mg/dL Final  01/09/2018 04:51 PM 2.72 (H) 0.61 - 1.24 mg/dL Final  01/09/2018 12:04 PM 2.60 (H) 0.61 - 1.24 mg/dL Final  11/18/2017 11:45 AM 1.79 (H) 0.76 - 1.27 mg/dL Final  11/13/2017 06:04 AM 1.79 (H) 0.61 - 1.24 mg/dL Final  11/12/2017 05:47 AM 1.77 (H) 0.61 - 1.24 mg/dL Final  11/11/2017 04:42 PM 1.82 (H) 0.61 - 1.24 mg/dL Final  11/10/2017 01:24 AM 1.57 (H) 0.61 - 1.24 mg/dL Final  11/09/2017  05:03 PM 1.73 (H) 0.61 - 1.24 mg/dL Final  11/09/2017 12:34 PM 1.62 (H) 0.61 - 1.24 mg/dL Final  11/08/2017 09:47 AM 1.70 (H) 0.61 - 1.24 mg/dL Final  11/22/2016 01:44 PM 1.41 (H) 0.76 - 1.27 mg/dL Final  11/15/2016 07:05 AM 1.40 (H) 0.61 - 1.24 mg/dL Final  04/29/2016 08:17 AM 1.44 (H) 0.76 - 1.27 mg/dL Final  10/27/2015 08:52 AM 1.39 (H) 0.76 - 1.27 mg/dL Final  09/26/2015 08:33 AM 1.50 (H) 0.61 - 1.24 mg/dL Final  04/28/2015 08:56 AM 1.34 (H) 0.76 - 1.27 mg/dL Final  11/05/2014 08:49 AM 1.34 (H) 0.76 - 1.27 mg/dL Final  04/29/2014 08:36 AM 1.47 (H) 0.76 - 1.27 mg/dL Final  05/30/2012 08:57 AM 1.30 0.50 - 1.35 mg/dL Final     PMHx:   Past Medical History:  Diagnosis Date  . 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  [name] Cataract    right eye - surgery to remove  . Elevated PSA 11/29/2016   Patient is followed by [facility].  Most recent PSA near 11.  He has had atypia on biopsy.  November 2008  . Essential hypertension, benign 06/08/2012  . Full dentures   . Glaucoma   . Heart murmur  since childhood, never has caused any problems  . History of rheumatic fever 11/04/2015  . Hyperlipidemia 04/25/2013  . Hypertension   . Malignant neoplasm of left lung ([place]) 02/08/2017  . RBBB 07/12/2017  . Sickle cell trait ([place])    no problems per patient    Past Surgical History:  Procedure Laterality Date  . BIOPSY  11/11/2017   Procedure: BIOPSY;  Surgeon: Lavena Bullion, DO;  Location: [facility];  Service: Gastroenterology;;  . BIOPSY  01/10/2018   Procedure: BIOPSY;  Surgeon: [name], MD;  Location: [facility];  Service: Gastroenterology;;  . cataract eye surgery Right   . COLONOSCOPY  11/2009   hx polyps/[place]  . ESOPHAGOGASTRODUODENOSCOPY N/A 11/11/2017   Procedure: ESOPHAGOGASTRODUODENOSCOPY (EGD);  Surgeon: Lavena Bullion, DO;   Location: [facility]COPY;  Service: Gastroenterology;  Laterality: N/A;  . ESOPHAGOGASTRODUODENOSCOPY (EGD) WITH PROPOFOL N/A 01/10/2018   Procedure: ESOPHAGOGASTRODUODENOSCOPY (EGD) WITH PROPOFOL;  Surgeon: [name], MD;  Location: [facility];  Service: Gastroenterology;  Laterality: N/A;  . PROSTATE BIOPSY    . right eye surgery     to lower eye pressure  . TONSILLECTOMY    . VIDEO ASSISTED THORACOSCOPY (VATS)/ LOBECTOMY Right 12/26/2015  . wisdom tteeth ext      Family Hx:  Family History  Problem Relation Age of Onset  . Diabetes Mother   . Cerebral aneurysm Mother   . Lung cancer Father   . Diabetes Sister   . Colon cancer Neg Hx   . Colon polyps Neg Hx   . Esophageal cancer Neg Hx   . Rectal cancer Neg Hx   . Stomach cancer Neg Hx     Social History:  reports that he quit smoking about 6 years ago. His smoking use included cigarettes. He smoked 1.00 pack per day. He has never used smokeless tobacco. He reports current alcohol use of about 1.0 standard drinks of alcohol per week. He reports that he does not use drugs.  Allergies:  Allergies  Allergen Reactions  . Atorvastatin     Achy and tired    Medications: Prior to Admission medications   Medication Sig Start Date End Date Taking? Authorizing Provider  amLODipine (NORVASC) 2.5 MG tablet Take 1 tablet (2.5 mg total) by mouth daily. 11/07/17  Yes [name], MD  aspirin EC 81 MG tablet Take 81 mg by mouth daily.   Yes [provider]  Brinzolamide-Brimonidine (SIMBRINZA) 1-0.2 % SUSP Place 2-3 drops into both eyes 3 (three) times daily.    Yes [provider]  budesonide-formoterol (SYMBICORT) 80-4.5 MCG/ACT inhaler Inhale 2 puffs into the lungs 2 (two) times daily. 11/01/17  Yes [name], MD  metoprolol succinate (TOPROL-XL) 50 MG 24 hr tablet Take 1/2 tablet daily.Take with or immediately following a meal. 06/23/17  Yes Luking, Elayne Snare, MD  Multiple Vitamin (MULTIVITAMIN) tablet  Take 1 tablet by mouth daily.   Yes [provider]  Netarsudil-Latanoprost (ROCKLATAN) 0.02-0.005 % SOLN Place 1 drop into both eyes at bedtime.    Yes Marylynn Pearson, MD  pantoprazole (PROTONIX) 40 MG tablet Take 1 tablet (40 mg total) by mouth 2 (two) times daily. 11/30/17 01/29/18 Yes Willia Craze, NP  ketoconazole (NIZORAL) 2 % cream Apply to area twice daily Patient not taking: Reported on 01/09/2018 01/03/18   [name], MD  torsemide [facility]) 20 MG tablet Take two tablets by mouth every morning Patient not taking: Reported on 01/09/2018 01/09/18   [name],  MD    I have reviewed the patient's current medications and home medications  Labs:  BMP Latest Ref Rng & Units 01/13/2018 01/12/2018 01/10/2018  Glucose 70 - 99 mg/dL 112(H) 115(H) 103(H)  BUN 8 - 23 mg/dL 89(H) 79(H) 76(H)  Creatinine 0.61 - 1.24 mg/dL 2.96(H) 2.78(H) 2.48(H)  BUN/Creat Ratio 10 - 24 - - -  Sodium 135 - 145 mmol/L 136 138 137  Potassium 3.5 - 5.1 mmol/L 3.8 3.4(L) 3.3(L)  Chloride 98 - 111 mmol/L 100 101 102  CO2 22 - 32 mmol/L 23 23 21(L)  Calcium 8.9 - 10.3 mg/dL 7.8(L) 7.7(L) 7.6(L)    Urinalysis    Component Value Date/Time   COLORURINE YELLOW 01/13/2018 1058   APPEARANCEUR HAZY (A) 01/13/2018 1058   LABSPEC 1.014 01/13/2018 1058   PHURINE 5.0 01/13/2018 1058   GLUCOSEU NEGATIVE 01/13/2018 1058   HGBUR NEGATIVE 01/13/2018 1058   BILIRUBINUR NEGATIVE 01/13/2018 1058   KETONESUR NEGATIVE 01/13/2018 1058   PROTEINUR 100 (A) 01/13/2018 1058   NITRITE NEGATIVE 01/13/2018 1058   LEUKOCYTESUR NEGATIVE 01/13/2018 1058     ROS:  Pertinent items noted in HPI and remainder of comprehensive ROS otherwise negative.  Physical Exam: Vitals:   01/13/18 1413 01/13/18 1624  BP: (!) 91/56 (!) 114/97  Pulse: 88 86  Resp: (!) 21 20  Temp: 98.2 F (36.8 C) 98.2 F (36.8 C)  SpO2: 92% 93%     General adult male in chair in no acute distress at rest HEENT normocephalic  atraumatic extraocular movements intact sclera anicteric Neck supple trachea midline Lungs reduced breath sounds; on 3.5 liters of oxygen; no distress at rest  Heart S1S2; no rubs or gallops appreciated Abdomen soft nontender nondistended Extremities 2-3+ edema bilateral lower extremities  Psych normal mood and affect Neuro - no focal motor deficits on gross exam   Assessment/Plan: # AKI  - Suspect AKI is pre-renal; note profound hypoalbuminemia/third spacing and intravascularly depleted  - UA with protein but no 0-5 RBC's - Obtain urine protein/cr ratio - Discontinue scheduled lasix for now and dose lasix on PRN basis per respiratory status - Patient would want HD if this were indicated  - Trial of flomax - noting prostatomegaly and hesitancy   # CKD stage III  - Multifactorial with HTN, CHF on diuretics as well as prostatomegaly - Baseline Cr noted around 1.8  # Acute on chronic diastolic CHF  - Discontinue scheduled lasix for now and dose on PRN basis with close reassessment  # Acute hypoxic respiratory failure - On supplemental oxygen and has received lasix to optimize volume status  # Acute GI bleed - GI is following  - s/p PRBC's as below   # Anemia - secondary to acute blood loss  - Stable/improved and s/p PRBC's  # Aortic stenosis - Noted - Cardiology following    Claudia Desanctis 01/13/2018, 6:25 PM

## 2018-01-13 NOTE — Progress Notes (Signed)
Patient Demographics:    Andrew Guerra, is a 77 y.o. male, DOB - [birth year], ZOX:[id number]  Admit date - 01/09/2018   Admitting Physician [name], DO  Outpatient Primary MD for the patient is [name], MD  LOS - 4   Chief Complaint  Patient presents with  . 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 male with a PMH of HTN, HLD, bicuspid aortic valve with moderate AS, NSCLC s/p resection and radiation, and PUD admitted from PCPs office on 01/09/2018 with hemoglobin of 6.4  Plan:- 1)Acute Gi Bleed--- status post EGD on 01/10/2018 with nonbleeding gastric and duodenal ulcers, biopsies are pending, continue PPI twice daily, monitor H&H and transfuse as clinically indicated, hemoglobin is up to 9.1 posttransfusion of 2 units of packed cells this admission  2)HFpEF--acute on chronic diastolic dysfunction CHF exacerbation, last known EF 60 to 65% with moderate aortic stenosis... 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 Scheduled to see hematology outpatient but not until 03/2018--- be judicious with blood transfusion  7) Moderate aortic stenosis--- most likely contributing to #2 above, repeat echo pending  8)FEN--poor oral intake, weight loss and low albumin----nutritional supplements advised, patient albumin has been less than 2, will need IV albumin with IV diuretics  9)NSCLC - post prior surgery and radiation, patient goes to [facility] every 6 months Looks like patient had this surgically resected, then had recurrence followed by SBRT with shrinking of the recurrent tumor according to oncology office visit notes.  Disposition/Need for in-Hospital Stay- patient unable to be discharged at this time due to significant worsening of heart failure and volume overload requiring further diuresis and monitoring (see subjective section above)  Code Status : Full  Family Communication:  Wife  at bedside   Disposition Plan  : To be determined  Consults  : Cardiology/GI..... Consider hematology consult  DVT Prophylaxis  :  TEDS/ SCDs   Lab Results  Component Value Date   PLT 340 01/13/2018    Inpatient Medications  Scheduled Meds: . carvedilol  6.25 mg Oral BID WC  . feeding supplement (ENSURE ENLIVE)  237 mL Oral BID BM  . mouth rinse  15 mL Mouth Rinse BID  . mometasone-formoterol  2 puff Inhalation BID  . multivitamin with  minerals  1 tablet Oral Daily  . Netarsudil-Latanoprost  1 drop Both Eyes QHS  . pantoprazole  40 mg Oral BID  . tamsulosin  0.4 mg Oral QHS   Continuous Infusions: . albumin human Stopped (01/13/18 1934)   PRN Meds:.acetaminophen **OR** acetaminophen, ondansetron **OR** ondansetron (ZOFRAN) IV    Anti-infectives (From admission, onward)   None        Objective:   Vitals:   01/13/18 0518 01/13/18 1343 01/13/18 1413 01/13/18 1624  BP: 123/70 (!) 91/59 (!) 91/56 (!) 114/97  Pulse: 84 83 88 86  Resp: 18 20 (!) 21 20  Temp: 98.2 F (36.8 C) 97.8 F (36.6 C) 98.2 F (36.8 C) 98.2 F (36.8 C)  TempSrc:  Oral Oral Oral  SpO2: 97% 98% 92% 93%  Weight:      Height:        Wt Readings from Last 3 Encounters:  01/09/18 90.7 kg  01/09/18 99.7 kg  12/14/17 88.5 kg     Intake/Output Summary (Last 24 hours) at 01/13/2018 1936 Last data filed at 01/13/2018 1727 Gross per 24 hour  Intake 682.73 ml  Output 450 ml  Net 232.73 ml     Physical Exam Patient is examined daily including today on 01/13/18  , exams remain the same as of yesterday except that has changed   Gen:- Awake Alert, conversational dyspnea, HEENT:- [place].AT, No sclera icterus Neck-Supple Neck,.  Lungs-diminished in bases scattered rales, no wheezing CV- S1, S2 normal, regular 3/6 SM Abd-  +ve B.Sounds, Abd Soft, No tenderness,    Extremity/Skin:-3+ edema/anasarca extending from the toes into the lower abdomen with significant involvement of the penis and scrotum, has Thigh High TEDs, pedal pulses present  Psych-affect is appropriate, oriented x3 Neuro-no new focal deficits, no tremors GU--- patient with significant scrotal and penile edema,   Data Review:   Micro Results No results found for this or any previous visit (from the past 240 hour(s)).  Radiology Reports Ct Abdomen Pelvis Wo Contrast  Result Date: 01/09/2018 CLINICAL DATA:  Low hemoglobin. EXAM: CT ABDOMEN AND PELVIS WITHOUT CONTRAST  TECHNIQUE: Multidetector CT imaging of the abdomen and pelvis was performed following the standard protocol without IV contrast. COMPARISON:  CT 02/04/2015, abdomen MRI 12/09/2017 FINDINGS: Lower chest: Small bilateral pleural effusions. Cardiomegaly without pericardial effusion or thickening. Atherosclerosis of the included thoracic aorta without aneurysm. Coronary arteriosclerosis is seen. Pulmonary consolidation/atelectasis in the right middle lobe distribution. Hepatobiliary: Layering biliary sludge within the gallbladder without secondary signs of acute cholecystitis. The unenhanced liver demonstrates a granuloma in the right hepatic lobe. No definite mass or biliary dilatation. Pancreas: Unremarkable. No pancreatic ductal dilatation or surrounding inflammatory changes. Spleen: Normal size spleen with scattered calcifications noted within possibly representing vascular calcifications or granulomata. Adrenals/Urinary Tract: Scattered bilateral hyperdense lesions of the kidneys are nonspecific but may represent proteinaceous or hemorrhagic cysts, the largest is right-sided and interpolar measuring up 1.6 cm with more ill-defined hyperdensity slightly  exophytic off the right kidney. No nephrolithiasis or obstructive uropathy. The urinary bladder is unremarkable for the degree of distention. Stomach/Bowel: Stomach is within normal limits. The appendix is not confidently identified. No evidence of bowel wall thickening, distention, or inflammatory changes. Vascular/Lymphatic: Aortic atherosclerosis. No lymphadenopathy. Reproductive: Prostatomegaly measuring up to 5.7 cm. Other: Diffuse soft tissue anasarca. No ascites or free air. 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 IMPRESSION: 1. Pulmonary consolidation/atelectasis in the right middle lobe with small bilateral pleural effusions. 2. Scattered bilateral hyperdense lesions of the kidneys likely to represent proteinaceous or hemorrhagic cysts,  the largest is in the right interpolar and interpolar measuring 1.6 cm with more ill-defined hyperdensity slightly exophytic off the right kidney. Findings better characterized on recent prior MRI. 3. Hepatic and splenic granulomata. 4. Diffuse soft tissue anasarca. 5. 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 Aortic Atherosclerosis (ICD10-I70.0). Electronically Signed   By: Ashley Royalty M.D.   On: 01/09/2018 20:33   US Renal  Result Date: 01/13/2018 CLINICAL DATA:  Oliguria.  Elevated PSA. EXAM: RENAL / URINARY TRACT ULTRASOUND COMPLETE COMPARISON:  CT abdomen and pelvis 01/09/2018. FINDINGS: Right Kidney: Renal measurements: 10.5 x 4.6 x 5.0 cm = volume: 126.8 mL . Echogenicity within normal limits. A simple cyst measuring 1.9 cm in diameter is identified. No hydronephrosis visualized. Trace amount of perihepatic ascites is noted. Left Kidney: Renal measurements: 11.4 x 5.5 x 5.1 cm = volume: 163.2 mL. Echogenicity within normal limits. No mass or hydronephrosis visualized. Bladder: The bladder is almost completely decompressed. No focal abnormality. Prostatomegaly is seen. IMPRESSION: Negative for hydronephrosis or acute abnormality. Prostatomegaly. Trace amount of ascites. Electronically Signed   By: Inge Rise M.D.   On: 01/13/2018 11:29   Dg Chest Portable 1 View  Result Date: 01/09/2018 CLINICAL DATA:  Shortness of breath for 1 day. 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 EXAM: PORTABLE CHEST 1 VIEW COMPARISON:  Radiographs 11/09/2017. Abdominal MRI 12/09/2017. PET-CT 11/25/2015. FINDINGS: 1902 hours. There are right-greater-than-left pleural effusions which have enlarged. There is associated bibasilar pulmonary opacity, likely atelectasis. There are stable postsurgical changes in the right hilar region. No pneumothorax or acute osseous findings are seen. IMPRESSION: Enlarging right-greater-than-left pleural effusions with associated bibasilar pulmonary opacities, likely atelectasis.  No invert pulmonary edema. Electronically Signed   By: Richardean Sale M.D.   On: 01/09/2018 19:35     CBC Recent Labs  Lab 01/09/18 1204 01/09/18 1651 01/10/18 0406 01/10/18 1058 01/12/18 0443 01/13/18 0354  WBC 22.0* 22.2*  --   --  26.3* 23.8*  HGB 6.4* 6.4* 7.9* 7.8* 9.1* 8.4*  HCT 21.1* 21.7* 25.1* 24.2* 29.1* 26.7*  PLT 361 387  --   --  360 340  MCV 77.0* 78.1*  --   --  75.6* 74.6*  MCH 23.4* 23.0*  --   --  23.6* 23.5*  MCHC 30.3 29.5*  --   --  31.3 31.5  RDW 22.0* 21.8*  --   --  21.0* 20.9*  LYMPHSABS 0.5*  --   --   --   --   --   MONOABS 1.2*  --   --   --   --   --   EOSABS 0.0  --   --   --   --   --   BASOSABS 0.0  --   --   --   --   --     Chemistries  Recent Labs  Lab 01/09/18 1204 01/09/18 1651  01/10/18 0406 01/12/18 0443 01/13/18 0354  NA 136 138 137 138 136  K 3.0* 3.6 3.3* 3.4* 3.8  CL 104 102 102 101 100  CO2 21* 21* 21* 23 23  GLUCOSE 107* 106* 103* 115* 112*  BUN 78* 76* 76* 79* 89*  CREATININE 2.60* 2.72* 2.48* 2.78* 2.96*  CALCIUM 7.5* 7.6* 7.6* 7.7* 7.8*  AST 54* 56*  --   --  40  ALT 30 33  --   --  41  ALKPHOS 80 82  --   --  92  BILITOT 0.4 0.4  --   --  0.5   ------------------------------------------------------------------------------------------------------------------ No results for input(s): CHOL, HDL, LDLCALC, TRIG, CHOLHDL, LDLDIRECT in the last 72 hours.  No results found for: HGBA1C ------------------------------------------------------------------------------------------------------------------ No results for input(s): TSH, T4TOTAL, T3FREE, THYROIDAB in the last 72 hours.  Invalid input(s): FREET3 ------------------------------------------------------------------------------------------------------------------ No results for input(s): VITAMINB12, FOLATE, FERRITIN, TIBC, IRON, RETICCTPCT in the last 72 hours.  Coagulation profile Recent Labs  Lab 01/09/18 1651  INR 1.21    No results for input(s): DDIMER in  the last 72 hours.  Cardiac Enzymes No results for input(s): CKMB, TROPONINI, MYOGLOBIN in the last 168 hours.  Invalid input(s): CK ------------------------------------------------------------------------------------------------------------------    Component Value Date/Time   BNP 598.0 (H) 01/09/2018 1205     Roxan Hockey M.D on 01/13/2018 at 7:36 PM  Pager---[phone] Go to [email or web address] - password [email or web address] for contact info  [facility] - Office  [phone]

## 2018-01-14 ENCOUNTER — Inpatient Hospital Stay (HOSPITAL_COMMUNITY): Payer: Medicare Other

## 2018-01-14 DIAGNOSIS — I509 Heart failure, unspecified: Secondary | ICD-10-CM

## 2018-01-14 LAB — RENAL FUNCTION PANEL
ALBUMIN: 1.7 g/dL — AB (ref 3.5–5.0)
Anion gap: 10 (ref 5–15)
BUN: 102 mg/dL — ABNORMAL HIGH (ref 8–23)
CO2: 24 mmol/L (ref 22–32)
Calcium: 8.1 mg/dL — ABNORMAL LOW (ref 8.9–10.3)
Chloride: 102 mmol/L (ref 98–111)
Creatinine, Ser: 3.01 mg/dL — ABNORMAL HIGH (ref 0.61–1.24)
GFR calc Af Amer: 22 mL/min — ABNORMAL LOW (ref >60)
GFR, EST NON AFRICAN AMERICAN: 19 mL/min — AB (ref >60)
Glucose, Bld: 126 mg/dL — ABNORMAL HIGH (ref 70–99)
Phosphorus: 4.6 mg/dL (ref 2.5–4.6)
Potassium: 3.5 mmol/L (ref 3.5–5.1)
Sodium: 136 mmol/L (ref 135–145)

## 2018-01-14 LAB — CBC
HCT: 27.3 % — ABNORMAL LOW (ref 39.0–52.0)
Hemoglobin: 8.9 g/dL — ABNORMAL LOW (ref 13.0–17.0)
MCH: 24.7 pg — ABNORMAL LOW (ref 26.0–34.0)
MCHC: 32.6 g/dL (ref 30.0–36.0)
MCV: 75.6 fL — ABNORMAL LOW (ref 80.0–100.0)
Platelets: 338 10*3/uL (ref 150–400)
RBC: 3.61 MIL/uL — ABNORMAL LOW (ref 4.22–5.81)
RDW: 21 % — ABNORMAL HIGH (ref 11.5–15.5)
WBC: 19.5 10*3/uL — ABNORMAL HIGH (ref 4.0–10.5)
nRBC: 0.4 % — ABNORMAL HIGH (ref 0.0–0.2)

## 2018-01-14 LAB — HEMOGLOBIN A1C
Hgb A1c MFr Bld: 5 % (ref 4.8–5.6)
Mean Plasma Glucose: 96.8 mg/dL

## 2018-01-14 LAB — TYPE AND SCREEN
ABO/RH(D): B POS
Antibody Screen: NEGATIVE
Unit division: 0

## 2018-01-14 LAB — URINE CULTURE
Culture: >=100000 — AB
Special Requests: NORMAL

## 2018-01-14 LAB — BPAM RBC
Blood Product Expiration Date: 202001032359
ISSUE DATE / TIME: 201912271351
UNIT TYPE AND RH: 1700

## 2018-01-14 MED ORDER — ALBUMIN HUMAN 25 % IV SOLN
25.0000 g | Freq: Once | INTRAVENOUS | Status: AC
  Administered 2018-01-14: 25 g via INTRAVENOUS
  Filled 2018-01-14: qty 100

## 2018-01-14 MED ORDER — METOLAZONE 5 MG PO TABS
5.0000 mg | ORAL_TABLET | Freq: Once | ORAL | Status: AC
  Administered 2018-01-14: 5 mg via ORAL
  Filled 2018-01-14: qty 1

## 2018-01-14 MED ORDER — FUROSEMIDE 10 MG/ML IJ SOLN
10.0000 mg/h | INTRAVENOUS | Status: DC
  Administered 2018-01-14: 10 mg/h via INTRAVENOUS
  Filled 2018-01-14 (×2): qty 25

## 2018-01-14 NOTE — Progress Notes (Signed)
DAILY PROGRESS NOTE   Patient Name: Andrew Guerra Date of Encounter: 01/14/2018  Chief Complaint   Asked to see for CHF   Still SOB   No CP    Patient Profile   Andrew Guerra is a 78 y.o. male with a PMH of HTN, HLD, bicuspid aortic valve with moderate AS, NSCLC s/p resection and radiation, and PUD, who is being seen today for the evaluation of CHF at the request of Dr. Denton Brick.  Subjective    Objective   Vitals:   01/13/18 1624 01/13/18 2016 01/13/18 2146 01/14/18 0603  BP: (!) 114/97  (!) 98/59 (!) 100/53  Pulse: 86  87 83  Resp: 20     Temp: 98.2 F (36.8 C)  97.8 F (36.6 C) (!) 97.3 F (36.3 C)  TempSrc: Oral  Oral Oral  SpO2: 93% 95% 100% 96%  Weight:      Height:        Intake/Output Summary (Last 24 hours) at 01/14/2018 0924 Last data filed at 01/14/2018 0500 Gross per 24 hour  Intake 412 ml  Output 550 ml  Net -138 ml   Net negative 1.1 L  Filed Weights   01/09/18 1633  Weight: 90.7 kg    Physical Exam  Neck:  JVP is elevated Lungs are relatively clear     Cardiac exam:   RRR   GR II/VI systolic murmur base Abd is obese  Distended Ext:   2 to 3 + edema  Inpatient Medications    Scheduled Meds: . carvedilol  6.25 mg Oral BID WC  . feeding supplement (ENSURE ENLIVE)  237 mL Oral BID BM  . mouth rinse  15 mL Mouth Rinse BID  . mometasone-formoterol  2 puff Inhalation BID  . multivitamin with minerals  1 tablet Oral Daily  . Netarsudil-Latanoprost  1 drop Both Eyes QHS  . pantoprazole  40 mg Oral BID  . tamsulosin  0.4 mg Oral QHS    Continuous Infusions: . albumin human Stopped (01/13/18 2200)    PRN Meds: acetaminophen **OR** acetaminophen, ondansetron **OR** ondansetron (ZOFRAN) IV   Labs   Results for orders placed or performed during the hospital encounter of 01/09/18 (from the past 48 hour(s))  CBC     Status: Abnormal   Collection Time: 01/13/18  3:54 AM  Result Value Ref Range   WBC 23.8 (H) 4.0 - 10.5 K/uL   RBC 3.58  (L) 4.22 - 5.81 MIL/uL   Hemoglobin 8.4 (L) 13.0 - 17.0 g/dL    Comment: Reticulocyte Hemoglobin testing may be clinically indicated, consider ordering this additional test [id number]    HCT 26.7 (L) 39.0 - 52.0 %   MCV 74.6 (L) 80.0 - 100.0 fL   MCH 23.5 (L) 26.0 - 34.0 pg   MCHC 31.5 30.0 - 36.0 g/dL   RDW 20.9 (H) 11.5 - 15.5 %   Platelets 340 150 - 400 K/uL   nRBC 0.3 (H) 0.0 - 0.2 %    Comment: Performed at [facility], [address] [place], [place] [address]  Comprehensive metabolic panel     Status: Abnormal   Collection Time: 01/13/18  3:54 AM  Result Value Ref Range   Sodium 136 135 - 145 mmol/L   Potassium 3.8 3.5 - 5.1 mmol/L   Chloride 100 98 - 111 mmol/L   CO2 23 22 - 32 mmol/L   Glucose, Bld 112 (H) 70 - 99 mg/dL   BUN 89 (H) 8 -  23 mg/dL   Creatinine, Ser 2.96 (H) 0.61 - 1.24 mg/dL   Calcium 7.8 (L) 8.9 - 10.3 mg/dL   Total Protein 4.7 (L) 6.5 - 8.1 g/dL   Albumin 1.6 (L) 3.5 - 5.0 g/dL   AST 40 15 - 41 U/L   ALT 41 0 - 44 U/L   Alkaline Phosphatase 92 38 - 126 U/L   Total Bilirubin 0.5 0.3 - 1.2 mg/dL   GFR calc non Af Amer 19 (L) >60 mL/min   GFR calc Af Amer 22 (L) >60 mL/min   Anion gap 13 5 - 15    Comment: Performed at [facility] [address] [place], [place] [address]  Urine Culture     Status: Abnormal   Collection Time: 01/13/18  8:25 AM  Result Value Ref Range   Specimen Description URINE, CLEAN CATCH    Special Requests      Normal Performed at [facility], [place] [address], [place], [place] [address]    Culture (A)     >=100,000 COLONIES/mL MULTIPLE SPECIES PRESENT, SUGGEST RECOLLECTION   Report Status 01/14/2018 FINAL   Culture, blood (Routine X 2) w Reflex to ID Panel     Status: None (Preliminary result)   Collection Time: 01/13/18  8:50 AM  Result Value Ref Range   Specimen Description BLOOD BLOOD LEFT HAND    Special Requests      BOTTLES DRAWN AEROBIC AND ANAEROBIC Blood Culture adequate volume   Culture        NO GROWTH < 24 HOURS Performed at [facility], [place] [address], [place], [place] [address]    Report Status PENDING   Urinalysis, Routine w reflex microscopic     Status: Abnormal   Collection Time: 01/13/18 10:58 AM  Result Value Ref Range   Color, Urine YELLOW YELLOW   APPearance HAZY (A) CLEAR   Specific Gravity, Urine 1.014 1.005 - 1.030   pH 5.0 5.0 - 8.0   Glucose, UA NEGATIVE NEGATIVE mg/dL   Hgb urine dipstick NEGATIVE NEGATIVE   Bilirubin Urine NEGATIVE NEGATIVE   Ketones, ur NEGATIVE NEGATIVE mg/dL   Protein, ur 100 (A) NEGATIVE mg/dL   Nitrite NEGATIVE NEGATIVE   Leukocytes, UA NEGATIVE NEGATIVE   RBC / HPF 0-5 0 - 5 RBC/hpf   WBC, UA 0-5 0 - 5 WBC/hpf   Bacteria, UA NONE SEEN NONE SEEN   Squamous Epithelial / LPF 0-5 0 - 5   Mucus PRESENT    Hyaline Casts, UA PRESENT    Granular Casts, UA PRESENT     Comment: Performed at [facility], [place] [address] [place], [place] [address]  Prepare RBC     Status: None   Collection Time: 01/13/18 12:02 PM  Result Value Ref Range   Order Confirmation ORDER PROCESSED BY BLOOD BANK   Type and screen [place]     Status: None   Collection Time: 01/13/18 12:04 PM  Result Value Ref Range   ABO/RH(D) B POS    Antibody Screen NEG    Sample Expiration 01/16/2018    Unit Number [id number]    Blood Component Type RBC LR PHER1    Unit division 00    Status of Unit      ISSUED,FINAL Performed at [facility], [place] [address] [place], [place] [address]    Transfusion Status OK TO TRANSFUSE    Crossmatch Result Compatible   Protein / creatinine  ratio, urine     Status: Abnormal   Collection Time: 01/13/18  9:15 PM  Result Value Ref Range   Creatinine, Urine 160.67 mg/dL   Total Protein, Urine 240 mg/dL    Comment: NO NORMAL RANGE ESTABLISHED FOR THIS TEST RESULTS CONFIRMED BY MANUAL DILUTION    Protein Creatinine Ratio 1.49 (H) 0.00 - 0.15 mg/mg[Cre]    Comment: Performed at [facility] [address] [place], [place] [address]    ECG   N/A  Telemetry   Sinus rhythm - Personally Reviewed  Radiology    US Renal  Result Date: 01/13/2018 CLINICAL DATA:  Oliguria.  Elevated PSA. EXAM: RENAL / URINARY TRACT ULTRASOUND COMPLETE COMPARISON:  CT abdomen and pelvis 01/09/2018. FINDINGS: Right Kidney: Renal measurements: 10.5 x 4.6 x 5.0 cm = volume: 126.8 mL . Echogenicity within normal limits. A simple cyst measuring 1.9 cm in diameter is identified. No hydronephrosis visualized. Trace amount of perihepatic ascites is noted. Left Kidney: Renal measurements: 11.4 x 5.5 x 5.1 cm = volume: 163.2 mL. Echogenicity within normal limits. No mass or hydronephrosis visualized. Bladder: The bladder is almost completely decompressed. No focal abnormality. Prostatomegaly is seen. IMPRESSION: Negative for hydronephrosis or acute abnormality. Prostatomegaly. Trace amount of ascites. Electronically Signed   By: Inge Rise M.D.   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   Malignant neoplasm of left lung (HCC)   Acute on chronic diastolic CHF (congestive heart failure) (HCC)   Malnutrition of moderate degree   Iron deficiency anemia   Anasarca   Intestinal metaplasia of gastric mucosa   Plan   1 CHF   Echo with LVEF 65%  RV is mod to severely dilated with moderate RV systolic dysfunction   Volume overload in  setting of hypoalbuminemia   Alb 1.8 Pt says that up untll last summer he had no edema and was going to gym 3x per week     Echo last April 2019 with mild  mod RV dysfuncton  Mod AS RV now is moderarely reduced   Evd  Of volume / pressure overload  WOuld benefit from a R heart cath to define   Pressures, output, O2 sats    WOuld continue to attempt diuresis for now     2  Bicuspid AV  Mean  gradient 22 mm Hg   3   GI   Pt presented with Hgb 6.4  IMproved  GI following   EGD with nonbleeding ulcers    4  CKD   Cr 2.96 today     5  Anemia    Dorris Carnes 01/14/2018, 9:24 AM

## 2018-01-14 NOTE — Progress Notes (Signed)
Reviewed labs and spoke with patient.  He is on 2.5 liters oxygen and continues with considerable edema with minimal urine output today.  Does have shortness of breath with exertion and the fluid makes it difficult to get around.  He is sitting in his chair.  He would want dialysis if it were indicated and understands this may be needed in the next 1-2 days without improvement in his renal function.  Wife present and supportive.  - lasix gtt at 10 mg/hr - albumin 25 gram IV (25%) once now  - Metolazone 5 mg PO once prior to lasix   Reassess response tomorrow.    Harrie Jeans, MD

## 2018-01-14 NOTE — Progress Notes (Signed)
CROSS COVER [facility]-GI Subjective: Patient denies any active GI symptoms at this time he denies having any nausea vomiting abdominal pain diarrhea constipation. He's had some bouts of confusion earlier today. He had an EGD on 01/10/2089 that showed gastric and duodenal ulcers biopsies are pending with regards H. Pylori status in the process of work. He presented with anemia and therefore the EGD was done to rule out a GI source of blood loss. He seems to be doing well on twice a day PPIs. Hemoglobin is stable after he received 2 units of packed red blood cells at 8.4 g/dL  Objective: Vital signs in last 24 hours: Temp:  [97.3 F (36.3 C)-98.2 F (36.8 C)] 97.3 F (36.3 C) (12/28 0603) Pulse Rate:  [83-88] 83 (12/28 0603) Resp:  [20-21] 20 (12/27 1624) BP: (91-114)/(53-97) 100/53 (12/28 0603) SpO2:  [92 %-100 %] 96 % (12/28 0603) Last BM Date: 01/13/18  Intake/Output from previous day: 12/27 0701 - 12/28 0700 In: 642 [P.O.:240; I.V.:30; Blood:322; IV Piggyback:50] Out: 750 [Urine:750] Intake/Output this shift: No intake/output data recorded.   GPE; patient is awake and alert but slightly short of breath while having a conversation HEENT atraumatic normocephalic Neck supple chest auscultation is S1 is regular with 3 x 6 systolic murmur Abdomen soft with normal bowel sounds Neurological exam revealed no focal deficits  Lab Results: Recent Labs    01/12/18 0443 01/13/18 0354  WBC 26.3* 23.8*  HGB 9.1* 8.4*  HCT 29.1* 26.7*  PLT 360 340   BMET Recent Labs    01/12/18 0443 01/13/18 0354  NA 138 136  K 3.4* 3.8  CL 101 100  CO2 23 23  GLUCOSE 115* 112*  BUN 79* 89*  CREATININE 2.78* 2.96*  CALCIUM 7.7* 7.8*   LFT Recent Labs    01/13/18 0354  PROT 4.7*  ALBUMIN 1.6*  AST 40  ALT 41  ALKPHOS 92  BILITOT 0.5   Studies/Results: US Renal  Result Date: 01/13/2018 CLINICAL DATA:  Oliguria.  Elevated PSA. EXAM: RENAL / URINARY TRACT ULTRASOUND COMPLETE COMPARISON:   CT abdomen and pelvis 01/09/2018. FINDINGS: Right Kidney: Renal measurements: 10.5 x 4.6 x 5.0 cm = volume: 126.8 mL . Echogenicity within normal limits. A simple cyst measuring 1.9 cm in diameter is identified. No hydronephrosis visualized. Trace amount of perihepatic ascites is noted. Left Kidney: Renal measurements: 11.4 x 5.5 x 5.1 cm = volume: 163.2 mL. Echogenicity within normal limits. No mass or hydronephrosis visualized. Bladder: The bladder is almost completely decompressed. No focal abnormality. Prostatomegaly is seen. IMPRESSION: Negative for hydronephrosis or acute abnormality. Prostatomegaly. Trace amount of ascites. Electronically Signed   By: Inge Rise M.D.   On: 01/13/2018 11:29   Medications: I have reviewed the patient's current medications.  Assessment/Plan: 1) acute GI bleed status post EGD done on 01/10/2018 revealing nonbleeding gastric and duodenal ulcers; biopsies for H. Pylori pending.  2) Acute blood loss anemia currently stable 3) Acute kidney injury support of a superimposed chronic kidney disease stage III. 4) ?Hemochromatosis-iron deposits towards the liver and spleen on MRI done in 11/22/2090hematology evaluation pending in March 2020, 5) Moderate aortic stenosis-EF of 60-65%.  6) Acute on chronic diastolic dysfunction.   LOS: 5 days   Karelly Dewalt 01/14/2018, 9:10 AM

## 2018-01-14 NOTE — Progress Notes (Signed)
Patient Demographics:    Andrew Guerra, is a 77 y.o. male, DOB - 1941/01/05, CHE:[id number]  Admit date - 01/09/2018   Admitting Physician [name], DO  Outpatient Primary MD for the patient is [name], MD  LOS - 5   Chief Complaint  Patient presents with  . 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 The degree of leftward septal   shift and D shaped septum has worsened. There is now a   trivial-small pericardial effusion. Side by side comparison of   images performed  Brief Summary 77 y.o. male with a PMH of HTN, HLD, bicuspid aortic valve with moderate AS, NSCLC s/p resection and radiation, and PUD admitted from PCPs office on 01/09/2018 with hemoglobin of 6.4  Plan:- 1)Acute Gi Bleed--- status post EGD on 01/10/2018 with nonbleeding gastric and duodenal ulcers, biopsies are pending, continue PPI twice daily, monitor H&H and transfuse as clinically indicated, hemoglobin is stable at 8.9  posttransfusion of 2 units of packed cells this admission  2)HFpEF--acute on chronic diastolic dysfunction CHF exacerbation, last known  EF 60 to 65% with moderate aortic stenosis... 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 Scheduled to see  hematology outpatient but not until 03/2018--- be judicious with blood transfusion  7) Moderate aortic stenosis--- most likely contributing to #2 above, repeat echo EF 60 to 65%, degree of stenosis is not worse   8)FEN--poor oral intake, weight loss and low albumin----nutritional supplements advised, patient albumin has been less than 2, c/n need IV albumin with IV diuretics  9)NSCLC - post prior surgery and radiation, patient goes to [facility] every 6 months Looks like patient had this surgically resected, then had recurrence followed by SBRT with shrinking  of the recurrent tumor according to oncology office visit notes.  10) transient episode of confusion/delirium/disorientation--- ???  Uremia related, now resolved,, continue to monitor closely.... May be hospital psychosis  Disposition/Need for in-Hospital Stay- patient unable to be discharged at this time due to significant worsening of heart failure, Renal failure and volume overload requiring further diuresis and monitoring (see subjective section above)  Code Status : Full  Family Communication:   Wife not at bedside   Disposition Plan  : To be determined  Consults  : Cardiology/GI/Nephrology..... Consider hematology consult  DVT Prophylaxis  :  TEDS/ SCDs   Lab Results  Component Value Date   PLT 338 01/14/2018    Inpatient Medications  Scheduled Meds: . carvedilol  6.25 mg Oral BID WC  . feeding supplement (ENSURE ENLIVE)  237 mL Oral BID BM  . mouth rinse  15 mL Mouth Rinse BID  . metolazone  5 mg Oral Once  . mometasone-formoterol  2 puff Inhalation BID  . multivitamin with minerals  1 tablet Oral Daily  . Netarsudil-Latanoprost  1 drop Both Eyes QHS  . pantoprazole  40 mg Oral BID  . tamsulosin  0.4 mg Oral QHS   Continuous Infusions: . furosemide (LASIX) infusion     PRN Meds:.acetaminophen **OR** acetaminophen, ondansetron **OR** ondansetron (ZOFRAN) IV   Anti-infectives (From admission, onward)   None        Objective:   Vitals:   01/13/18 2016 01/13/18 2146 01/14/18 0603 01/14/18 1441  BP:  (!) 98/59 (!) 100/53 109/78  Pulse:  87 83 91  Resp:    18  Temp:  97.8 F (36.6 C) (!) 97.3 F (36.3 C) 97.8 F (36.6 C)  TempSrc:  Oral Oral Oral  SpO2: 95% 100% 96% 100%  Weight:      Height:        Wt Readings from Last 3 Encounters:  01/09/18 90.7 kg  01/09/18 99.7 kg  12/14/17 88.5 kg     Intake/Output Summary (Last 24 hours) at 01/14/2018 1905 Last data filed at 01/14/2018 1800 Gross per 24 hour  Intake 240 ml  Output 525 ml  Net -285 ml      Physical Exam Patient is examined daily including today on 01/14/18  , exams remain the same as of yesterday except that has changed   Gen:- Awake Alert, conversational dyspnea, HEENT:- [place].AT, No sclera icterus Neck-Supple Neck, +ve JVD,.  Lungs-diminished in bases scattered rales in bases, no wheezing CV- S1, S2 normal, regular 3/6 SM Abd-  +ve B.Sounds, Abd Soft, No tenderness,    Extremity/Skin:-3+ edema/anasarca extending from the toes into the lower abdomen with  involvement of the penis and scrotum, has Thigh High TEDs, pedal pulses present  Psych-affect is appropriate, oriented x3 (had confusional episodes earlier in the morning) Neuro-no new focal deficits, no tremors GU--- patient with  scrotal and penile edema,   Data Review:   Micro Results Recent Results (from the past 240  hour(s))  Urine Culture     Status: Abnormal   Collection Time: 01/13/18  8:25 AM  Result Value Ref Range Status   Specimen Description URINE, CLEAN CATCH  Final   Special Requests   Final    Normal Performed at [facility], [address] [place], [place] [address]    Culture (A)  Final    >=100,000 COLONIES/mL MULTIPLE SPECIES PRESENT, SUGGEST RECOLLECTION   Report Status 01/14/2018 FINAL  Final  Culture, blood (Routine X 2) w Reflex to ID Panel     Status: None (Preliminary result)   Collection Time: 01/13/18  8:50 AM  Result Value Ref Range Status   Specimen Description BLOOD BLOOD LEFT HAND  Final   Special Requests   Final    BOTTLES DRAWN AEROBIC AND ANAEROBIC Blood Culture adequate volume   Culture   Final    NO GROWTH 1 DAY Performed at [facility], [address], [place], [place] [address]    Report Status PENDING  Incomplete    Radiology Reports Ct Abdomen Pelvis Wo Contrast  Result Date: 01/09/2018 CLINICAL DATA:  Low hemoglobin. EXAM: CT ABDOMEN AND PELVIS WITHOUT CONTRAST TECHNIQUE: Multidetector CT imaging of the abdomen and pelvis was performed  following the standard protocol without IV contrast. COMPARISON:  CT 02/04/2015, abdomen MRI 12/09/2017 FINDINGS: Lower chest: Small bilateral pleural effusions. Cardiomegaly without pericardial effusion or thickening. Atherosclerosis of the included thoracic aorta without aneurysm. Coronary arteriosclerosis is seen. Pulmonary consolidation/atelectasis in the right middle lobe distribution. Hepatobiliary: Layering biliary sludge within the gallbladder without secondary signs of acute cholecystitis. The unenhanced liver demonstrates a granuloma in the right hepatic lobe. No definite mass or biliary dilatation. Pancreas: Unremarkable. No pancreatic ductal dilatation or surrounding inflammatory changes. Spleen: Normal size spleen with scattered calcifications noted within possibly representing vascular calcifications or granulomata. Adrenals/Urinary Tract: Scattered bilateral hyperdense lesions of the kidneys are nonspecific but may represent proteinaceous or hemorrhagic cysts, the largest is right-sided and interpolar measuring up 1.6 cm with more ill-defined hyperdensity slightly exophytic off the right kidney. No nephrolithiasis or obstructive uropathy. The urinary bladder is unremarkable for the degree of distention. Stomach/Bowel: Stomach is within normal limits. The appendix is not confidently identified. No evidence of bowel wall thickening, distention, or inflammatory changes. Vascular/Lymphatic: Aortic atherosclerosis. No lymphadenopathy. Reproductive: Prostatomegaly measuring up to 5.7 cm. Other: Diffuse soft tissue anasarca. No ascites or free air. 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 IMPRESSION: 1. Pulmonary consolidation/atelectasis in the right middle lobe with small bilateral pleural effusions. 2. Scattered bilateral hyperdense lesions of the kidneys likely to represent proteinaceous or hemorrhagic cysts, the largest is in the right interpolar and interpolar measuring 1.6 cm with  more ill-defined hyperdensity slightly exophytic off the right kidney. Findings better characterized on recent prior MRI. 3. Hepatic and splenic granulomata. 4. Diffuse soft tissue anasarca. 5. 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 Aortic Atherosclerosis (ICD10-I70.0). Electronically Signed   By: Ashley Royalty M.D.   On: 01/09/2018 20:33   Dg Chest 2 View  Result Date: 01/14/2018 CLINICAL DATA:  Shortness of breath EXAM: CHEST - 2 VIEW COMPARISON:  Chest radiograph 01/09/2018 FINDINGS: Monitoring leads overlie the patient. Stable cardiac and mediastinal contours. Similar-appearing patchy consolidation within the right greater than left lower lungs. Moderate right and small left pleural effusions. No pneumothorax. IMPRESSION: Similar-appearing patchy consolidation within the right greater than left lower lungs with moderate right and small left pleural effusions. Electronically Signed   By: Lovey Newcomer  M.D.   On: 01/14/2018 16:08   US Renal  Result Date: 01/13/2018 CLINICAL DATA:  Oliguria.  Elevated PSA. EXAM: RENAL / URINARY TRACT ULTRASOUND COMPLETE COMPARISON:  CT abdomen and pelvis 01/09/2018. FINDINGS: Right Kidney: Renal measurements: 10.5 x 4.6 x 5.0 cm = volume: 126.8 mL . Echogenicity within normal limits. A simple cyst measuring 1.9 cm in diameter is identified. No hydronephrosis visualized. Trace amount of perihepatic ascites is noted. Left Kidney: Renal measurements: 11.4 x 5.5 x 5.1 cm = volume: 163.2 mL. Echogenicity within normal limits. No mass or hydronephrosis visualized. Bladder: The bladder is almost completely decompressed. No focal abnormality. Prostatomegaly is seen. IMPRESSION: Negative for hydronephrosis or acute abnormality. Prostatomegaly. Trace amount of ascites. Electronically Signed   By: Inge Rise M.D.   On: 01/13/2018 11:29   Dg Chest Portable 1 View  Result Date: 01/09/2018 CLINICAL DATA:  Shortness of breath for 1 day. 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 EXAM: PORTABLE CHEST 1 VIEW COMPARISON:  Radiographs 11/09/2017. Abdominal MRI 12/09/2017. PET-CT 11/25/2015. FINDINGS: 1902 hours. There are right-greater-than-left pleural effusions which have enlarged. There is associated bibasilar pulmonary opacity, likely atelectasis. There are stable postsurgical changes in the right hilar region. No pneumothorax or acute osseous findings are seen. IMPRESSION: Enlarging right-greater-than-left pleural effusions with associated bibasilar pulmonary opacities, likely atelectasis. No invert pulmonary edema. Electronically Signed   By: Richardean Sale M.D.   On: 01/09/2018 19:35     CBC Recent Labs  Lab 01/09/18 1204 01/09/18 1651 01/10/18 0406 01/10/18 1058 01/12/18 0443 01/13/18 0354 01/14/18 1015  WBC 22.0* 22.2*  --   --  26.3* 23.8* 19.5*  HGB 6.4* 6.4* 7.9* 7.8* 9.1* 8.4* 8.9*  HCT 21.1* 21.7* 25.1* 24.2* 29.1* 26.7* 27.3*  PLT 361 387  --   --  360 340 338  MCV 77.0* 78.1*  --   --  75.6* 74.6* 75.6*  MCH 23.4* 23.0*  --   --  23.6* 23.5* 24.7*  MCHC 30.3 29.5*  --   --  31.3 31.5 32.6  RDW 22.0* 21.8*  --   --  21.0* 20.9* 21.0*  LYMPHSABS 0.5*  --   --   --   --   --   --   MONOABS 1.2*  --   --   --   --   --   --   EOSABS 0.0  --   --   --   --   --   --   BASOSABS 0.0  --   --   --   --   --   --     Chemistries  Recent Labs  Lab 01/09/18 1204 01/09/18 1651 01/10/18 0406 01/12/18 0443 01/13/18 0354 01/14/18 1015  NA 136 138 137 138 136 136  K 3.0* 3.6 3.3* 3.4* 3.8 3.5  CL 104 102 102 101 100 102  CO2 21* 21* 21* 23 23 24   GLUCOSE 107* 106* 103* 115* 112* 126*  BUN 78* 76* 76* 79* 89* 102*  CREATININE 2.60* 2.72* 2.48* 2.78* 2.96* 3.01*  CALCIUM 7.5* 7.6* 7.6* 7.7* 7.8* 8.1*  AST 54* 56*  --   --  40  --   ALT 30 33  --   --  41  --   ALKPHOS 80 82  --   --  92  --   BILITOT 0.4 0.4  --   --  0.5  --     ------------------------------------------------------------------------------------------------------------------ No results for input(s):  CHOL, HDL, LDLCALC, TRIG, CHOLHDL, LDLDIRECT in the last 72 hours.  Lab Results  Component Value Date   HGBA1C 5.0 01/14/2018   ------------------------------------------------------------------------------------------------------------------ No results for input(s): TSH, T4TOTAL, T3FREE, THYROIDAB in the last 72 hours.  Invalid input(s): FREET3 ------------------------------------------------------------------------------------------------------------------ No results for input(s): VITAMINB12, FOLATE, FERRITIN, TIBC, IRON, RETICCTPCT in the last 72 hours.  Coagulation profile Recent Labs  Lab 01/09/18 1651  INR 1.21    No results for input(s): DDIMER in the last 72 hours.  Cardiac Enzymes No results for input(s): CKMB, TROPONINI, MYOGLOBIN in the last 168 hours.  Invalid input(s): CK ------------------------------------------------------------------------------------------------------------------    Component Value Date/Time   BNP 598.0 (H) 01/09/2018 1205     Roxan Hockey M.D on 01/14/2018 at 7:05 PM  Pager---[phone] Go to [email or web address] - password [email or web address] for contact info  [facility] - Office  [phone]

## 2018-01-14 NOTE — Progress Notes (Signed)
[facility] Progress Note  Name: Andrew Guerra MRN: [id number] DOB: [birth year]  Chief Complaint:  Swelling and GI bleed  Subjective:  Patient had 750 mL UOP over 12/27.  He has no labs available this AM but I do see that these have just been ordered.  He received two lasix 80 mg IV doses and one 60 mg IV dose on 12/27.  He has remained on 4L nasal cannula.  He reports that the bolus lasix doesn't produce the response it did the last time he was in the hospital.   Review of systems:  Confusion noted this AM  Reports shortness of breath with exertion No n/v Denies chest pain  --------------------------- Background on Consult:  Andrew Guerra is a 77 y.o. male with a history of HTN and aortic stenosis who presented with GI bleed and hemoglobin 6.5 with PCP and report of black stool.  Found also with fluid overload/anasarca and CHF.  He has been evaluated by cardiology and has received lasix BID; only had 150 mL UOP charted on 12/26.  Work-up thus far notable for renal ultrasound without hydro and with trace abdominal ascites and prostatomegaly.  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    Spoke with patient as well a his wife and daughter.  He reports that his shortness of breath and swelling have worsened since around October of this year.  He reports that he had been on lasix daily for a couple of weeks prior to admission and had just been changed to torsemide when admitted.  He does have some urinary hesitancy and weak stream.  He has followed with urology previously for elevated PSA (which he reports to be around 7) but has not seen nephrology.  He denies NSAID use.  He would want dialysis if indicated.   Work-up has included ANA neg   Intake/Output Summary (Last 24 hours) at 01/14/2018 1013 Last data filed at 01/14/2018 0500 Gross per 24 hour  Intake 412 ml  Output 550 ml  Net -138 ml    Vitals:  Vitals:   01/13/18 1624 01/13/18  2016 01/13/18 2146 01/14/18 0603  BP: (!) 114/97  (!) 98/59 (!) 100/53  Pulse: 86  87 83  Resp: 20     Temp: 98.2 F (36.8 C)  97.8 F (36.6 C) (!) 97.3 F (36.3 C)  TempSrc: Oral  Oral Oral  SpO2: 93% 95% 100% 96%  Weight:      Height:         Physical Exam:  General adult male in bed in no acute distress HEENT normocephalic atraumatic extraocular movements intact sclera anicteric Neck supple trachea midline Lungs clear to auscultation bilaterally normal work of breathing at rest  Heart regular rate and rhythm no rubs or gallops appreciated Abdomen soft nontender nondistended Extremities 2+ edema bilateral lower extremities Psych normal mood and affect GU no foley    Medications reviewed   Labs:  BMP Latest Ref Rng & Units 01/13/2018 01/12/2018 01/10/2018  Glucose 70 - 99 mg/dL 112(H) 115(H) 103(H)  BUN 8 - 23 mg/dL 89(H) 79(H) 76(H)  Creatinine 0.61 - 1.24 mg/dL 2.96(H) 2.78(H) 2.48(H)  BUN/Creat Ratio 10 - 24 - - -  Sodium 135 - 145 mmol/L 136 138 137  Potassium 3.5 - 5.1 mmol/L 3.8 3.4(L) 3.3(L)  Chloride 98 - 111 mmol/L 100 101 102  CO2 22 - 32 mmol/L 23 23 21(L)  Calcium 8.9 - 10.3 mg/dL 7.8(L) 7.7(L) 7.6(L)  Assessment/Plan:  # AKI  - Suspect AKI is pre-renal; note profound hypoalbuminemia/third spacing and intravascularly depleted.  Note UA with protein but no 0-5 RBC's.  ANA negative.  UP/cr ratio with 1490 mg/g - Renal function panel is ordered for this AM - not yet returned - He is off of scheduled lasix for now - awaiting labs.  Anticipate starting lasix gtt - Patient would want HD if this were indicated  - Trial of flomax - noting prostatomegaly and hesitancy   # CKD stage III  - Multifactorial with HTN, CHF on diuretics as well as prostatomegaly - Baseline Cr noted around 1.8  # Acute on chronic diastolic CHF - Off of scheduled lasix for now and dose on PRN basis with close reassessment - May benefit from lasix gtt at 10 mg/hr  #  Proteinuria - Send SPEP and hemoglobin A1c  - UPEP and serum free light chains sent as misc send out tests   # Acute hypoxic respiratory failure - On supplemental oxygen and is receiving lasix to optimize volume status  # Acute GI bleed - GI is following  - s/p PRBC's  # Anemia - secondary to acute blood loss  - Stable/improved and s/p PRBC's  - Note CBC was ordered for this AM  # Aortic stenosis - Noted - Cardiology following   # Prostatomegaly  - Trial of flomax as above   Claudia Desanctis, MD 01/14/2018 10:13 AM

## 2018-01-15 LAB — RENAL FUNCTION PANEL
Albumin: 1.8 g/dL — ABNORMAL LOW (ref 3.5–5.0)
Anion gap: 11 (ref 5–15)
BUN: 107 mg/dL — ABNORMAL HIGH (ref 8–23)
CHLORIDE: 100 mmol/L (ref 98–111)
CO2: 26 mmol/L (ref 22–32)
Calcium: 8 mg/dL — ABNORMAL LOW (ref 8.9–10.3)
Creatinine, Ser: 3.03 mg/dL — ABNORMAL HIGH (ref 0.61–1.24)
GFR calc Af Amer: 22 mL/min — ABNORMAL LOW (ref >60)
GFR calc non Af Amer: 19 mL/min — ABNORMAL LOW (ref >60)
Glucose, Bld: 109 mg/dL — ABNORMAL HIGH (ref 70–99)
POTASSIUM: 3.5 mmol/L (ref 3.5–5.1)
Phosphorus: 4.5 mg/dL (ref 2.5–4.6)
Sodium: 137 mmol/L (ref 135–145)

## 2018-01-15 MED ORDER — CHLORHEXIDINE GLUCONATE CLOTH 2 % EX PADS
6.0000 | MEDICATED_PAD | Freq: Every day | CUTANEOUS | Status: DC
  Administered 2018-01-16: 6 via TOPICAL

## 2018-01-15 MED ORDER — ALBUMIN HUMAN 25 % IV SOLN
25.0000 g | Freq: Once | INTRAVENOUS | Status: AC
  Administered 2018-01-15: 25 g via INTRAVENOUS
  Filled 2018-01-15: qty 50

## 2018-01-15 MED ORDER — CEFAZOLIN SODIUM-DEXTROSE 2-4 GM/100ML-% IV SOLN
2.0000 g | INTRAVENOUS | Status: AC
  Filled 2018-01-15: qty 100

## 2018-01-15 MED ORDER — NEPRO/CARBSTEADY PO LIQD
237.0000 mL | Freq: Three times a day (TID) | ORAL | Status: DC
  Administered 2018-01-16 – 2018-01-24 (×15): 237 mL via ORAL
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
## 2018-01-15 NOTE — Progress Notes (Signed)
Chief Complaint: Patient was seen in consultation today for dialysis catheter at the request of Dr. Harrie Jeans  Referring Physician(s): Dr. Harrie Jeans  Supervising Physician: Arne Cleveland  Patient Status: [facility] - In-pt  History of Present Illness: Andrew Guerra is a 77 y.o. male with multiple medical issues, now with progressive renal failure in need of HD. IR is asked to place tunneled HD catheter. PMHx, meds, labs, imaging, allergies reviewed. Feels a little better Family at bedside.   Past Medical History:  Diagnosis Date  . 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  [name] Cataract    right eye - surgery to remove  . Elevated PSA 11/29/2016   Patient is followed [facility].  Most recent PSA near 11.  He has had atypia on biopsy.  November 2008  . Essential hypertension, benign 06/08/2012  . Full dentures   . Glaucoma   . Heart murmur    since childhood, never has caused any problems  . History of rheumatic fever 11/04/2015  . Hyperlipidemia 04/25/2013  . Hypertension   . Malignant neoplasm of left lung ([place]) 02/08/2017  . RBBB 07/12/2017  . Sickle cell trait ([place])    no problems per patient    Past Surgical History:  Procedure Laterality Date  . BIOPSY  11/11/2017   Procedure: BIOPSY;  Surgeon: Lavena Bullion, DO;  Location: [place] [facility];  Service: Gastroenterology;;  . BIOPSY  01/10/2018   Procedure: BIOPSY;  Surgeon: [name], MD;  Location: [place];  Service: Gastroenterology;;  . cataract eye surgery Right   . COLONOSCOPY  11/2009   hx polyps/[place]  . ESOPHAGOGASTRODUODENOSCOPY N/A 11/11/2017   Procedure: ESOPHAGOGASTRODUODENOSCOPY (EGD);  Surgeon: Lavena Bullion, DO;  Location: [facility]COPY;  Service: Gastroenterology;  Laterality: N/A;  . ESOPHAGOGASTRODUODENOSCOPY (EGD) WITH PROPOFOL N/A 01/10/2018   Procedure: ESOPHAGOGASTRODUODENOSCOPY (EGD) WITH PROPOFOL;  Surgeon: [name], MD;  Location: [place];  Service: Gastroenterology;  Laterality: N/A;  . PROSTATE BIOPSY    . right eye surgery     to lower eye pressure  . TONSILLECTOMY    . VIDEO ASSISTED THORACOSCOPY (VATS)/ LOBECTOMY Right 12/26/2015  . wisdom tteeth ext      Allergies: Atorvastatin  Medications:  Current Facility-Administered Medications:  .  acetaminophen (TYLENOL) tablet 650 mg, 650 mg, Oral, Q6H PRN **OR** acetaminophen (TYLENOL) suppository 650 mg, 650 mg, Rectal, Q6H PRN, [name], MD .  albumin human 25 % solution 25 g, 25 g, Intravenous, Once, Harrie Jeans C, MD .  carvedilol (COREG) tablet 6.25 mg, 6.25 mg, Oral, BID WC, Kroeger, Krista M., PA-C, 6.25 mg at 01/15/18 1034 .  feeding supplement (ENSURE ENLIVE) (ENSURE ENLIVE) liquid 237 mL, 237 mL, Oral, BID BM, Emokpae, Courage, MD, 237 mL at 01/15/18 1030 .  furosemide (LASIX) 250 mg in dextrose 5 % 250 mL (1 mg/mL) infusion, 10 mg/hr, Intravenous, Continuous, Claudia Desanctis, MD, Last Rate: 10 mL/hr at 01/14/18 2120, 10 mg/hr at 01/14/18 2120 .  MEDLINE mouth rinse, 15 mL, Mouth Rinse, BID, Emokpae, Courage, MD, 15 mL at 01/15/18 1040 .  mometasone-formoterol (DULERA) 100-5 MCG/ACT inhaler 2 puff, 2 puff, Inhalation, BID, [name], MD, 2 puff at 01/15/18 0905 .  multivitamin with minerals tablet 1 tablet, 1 tablet, Oral, Daily, [name], MD, 1 tablet at 01/15/18 1035 .  Netarsudil-Latanoprost 0.02-0.005 % SOLN 1 drop, 1 drop, Both  Eyes, Jake Shark, MD, 1 drop at 01/14/18 2121 .  ondansetron (ZOFRAN) tablet 4 mg, 4 mg, Oral, Q6H PRN **OR** ondansetron (ZOFRAN) injection 4 mg, 4 mg, Intravenous, Q6H PRN, [name], MD .  pantoprazole (PROTONIX) EC tablet 40 mg, 40 mg, Oral, BID, [name], MD, 40 mg at 01/15/18 1035    Family History  Problem Relation Age of Onset  . Diabetes Mother   .  Cerebral aneurysm Mother   . Lung cancer Father   . Diabetes Sister   . Colon cancer Neg Hx   . Colon polyps Neg Hx   . Esophageal cancer Neg Hx   . Rectal cancer Neg Hx   . Stomach cancer Neg Hx     Social History   Socioeconomic History  . Marital status: Married    Spouse name: Not on file  . Number of children: Not on file  . Years of education: Not on file  . Highest education level: Not on file  Occupational History  . Not on file  Social Needs  . Financial resource strain: Not on file  . Food insecurity:    Worry: Not on file    Inability: Not on file  . Transportation needs:    Medical: Not on file    Non-medical: Not on file  Tobacco Use  . Smoking status: Former Smoker    Packs/day: 1.00    Types: Cigarettes    Last attempt to quit: 01/19/2011    Years since quitting: 6.9  . Smokeless tobacco: Never Used  Substance and Sexual Activity  . Alcohol use: Yes    Alcohol/week: 1.0 standard drinks    Types: 1 Shots of liquor per week  . Drug use: No  . Sexual activity: Not on file  Lifestyle  . Physical activity:    Days per week: Not on file    Minutes per session: Not on file  . Stress: Not on file  Relationships  . Social connections:    Talks on phone: Not on file    Gets together: Not on file    Attends religious service: Not on file    Active member of club or organization: Not on file    Attends meetings of clubs or organizations: Not on file    Relationship status: Not on file  Other Topics Concern  . Not on file  Social History Narrative  . Not on file     Review of Systems: A 12 point ROS discussed and pertinent positives are indicated in the HPI above.  All other systems are negative.  Review of Systems  Vital Signs: BP 125/71 (BP Location: Left Arm)   Pulse 81   Temp (!) 97.3 F (36.3 C) (Oral)   Resp 18   Ht 5' 10.5" (1.791 m)   Wt 90.7 kg   SpO2 93%   BMI 28.29 kg/m   Physical Exam HENT:     Mouth/Throat:     Mouth: Mucous  membranes are moist.     Pharynx: Oropharynx is clear.  Cardiovascular:     Rate and Rhythm: Normal rate and regular rhythm.     Heart sounds: Normal heart sounds.  Pulmonary:     Effort: Pulmonary effort is normal. No respiratory distress.     Breath sounds: Normal breath sounds.  Neurological:     General: No focal deficit present.     Mental Status: He is alert and oriented to person, place, and time.  Psychiatric:  Mood and Affect: Mood normal.        Judgment: Judgment normal.      Imaging: Ct Abdomen Pelvis Wo Contrast  Result Date: 01/09/2018 CLINICAL DATA:  Low hemoglobin. EXAM: CT ABDOMEN AND PELVIS WITHOUT CONTRAST TECHNIQUE: Multidetector CT imaging of the abdomen and pelvis was performed following the standard protocol without IV contrast. COMPARISON:  CT 02/04/2015, abdomen MRI 12/09/2017 FINDINGS: Lower chest: Small bilateral pleural effusions. Cardiomegaly without pericardial effusion or thickening. Atherosclerosis of the included thoracic aorta without aneurysm. Coronary arteriosclerosis is seen. Pulmonary consolidation/atelectasis in the right middle lobe distribution. Hepatobiliary: Layering biliary sludge within the gallbladder without secondary signs of acute cholecystitis. The unenhanced liver demonstrates a granuloma in the right hepatic lobe. No definite mass or biliary dilatation. Pancreas: Unremarkable. No pancreatic ductal dilatation or surrounding inflammatory changes. Spleen: Normal size spleen with scattered calcifications noted within possibly representing vascular calcifications or granulomata. Adrenals/Urinary Tract: Scattered bilateral hyperdense lesions of the kidneys are nonspecific but may represent proteinaceous or hemorrhagic cysts, the largest is right-sided and interpolar measuring up 1.6 cm with more ill-defined hyperdensity slightly exophytic off the right kidney. No nephrolithiasis or obstructive uropathy. The urinary bladder is unremarkable for  the degree of distention. Stomach/Bowel: Stomach is within normal limits. The appendix is not confidently identified. No evidence of bowel wall thickening, distention, or inflammatory changes. Vascular/Lymphatic: Aortic atherosclerosis. No lymphadenopathy. Reproductive: Prostatomegaly measuring up to 5.7 cm. Other: Diffuse soft tissue anasarca. No ascites or free air. 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 IMPRESSION: 1. Pulmonary consolidation/atelectasis in the right middle lobe with small bilateral pleural effusions. 2. Scattered bilateral hyperdense lesions of the kidneys likely to represent proteinaceous or hemorrhagic cysts, the largest is in the right interpolar and interpolar measuring 1.6 cm with more ill-defined hyperdensity slightly exophytic off the right kidney. Findings better characterized on recent prior MRI. 3. Hepatic and splenic granulomata. 4. Diffuse soft tissue anasarca. 5. 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 Aortic Atherosclerosis (ICD10-I70.0). Electronically Signed   By: Ashley Royalty M.D.   On: 01/09/2018 20:33   Dg Chest 2 View  Result Date: 01/14/2018 CLINICAL DATA:  Shortness of breath EXAM: CHEST - 2 VIEW COMPARISON:  Chest radiograph 01/09/2018 FINDINGS: Monitoring leads overlie the patient. Stable cardiac and mediastinal contours. Similar-appearing patchy consolidation within the right greater than left lower lungs. Moderate right and small left pleural effusions. No pneumothorax. IMPRESSION: Similar-appearing patchy consolidation within the right greater than left lower lungs with moderate right and small left pleural effusions. Electronically Signed   By: Lovey Newcomer M.D.   On: 01/14/2018 16:08   US Renal  Result Date: 01/13/2018 CLINICAL DATA:  Oliguria.  Elevated PSA. EXAM: RENAL / URINARY TRACT ULTRASOUND COMPLETE COMPARISON:  CT abdomen and pelvis 01/09/2018. FINDINGS: Right Kidney: Renal measurements: 10.5 x 4.6 x 5.0 cm = volume: 126.8 mL .  Echogenicity within normal limits. A simple cyst measuring 1.9 cm in diameter is identified. No hydronephrosis visualized. Trace amount of perihepatic ascites is noted. Left Kidney: Renal measurements: 11.4 x 5.5 x 5.1 cm = volume: 163.2 mL. Echogenicity within normal limits. No mass or hydronephrosis visualized. Bladder: The bladder is almost completely decompressed. No focal abnormality. Prostatomegaly is seen. IMPRESSION: Negative for hydronephrosis or acute abnormality. Prostatomegaly. Trace amount of ascites. Electronically Signed   By: Inge Rise M.D.   On: 01/13/2018 11:29   Dg Chest Portable 1 View  Result Date: 01/09/2018 CLINICAL DATA:  Shortness of breath for 1 day. History of left lung cancer  and rim attic fever. EXAM: PORTABLE CHEST 1 VIEW COMPARISON:  Radiographs 11/09/2017. Abdominal MRI 12/09/2017. PET-CT 11/25/2015. FINDINGS: 1902 hours. There are right-greater-than-left pleural effusions which have enlarged. There is associated bibasilar pulmonary opacity, likely atelectasis. There are stable postsurgical changes in the right hilar region. No pneumothorax or acute osseous findings are seen. IMPRESSION: Enlarging right-greater-than-left pleural effusions with associated bibasilar pulmonary opacities, likely atelectasis. No invert pulmonary edema. Electronically Signed   By: Richardean Sale M.D.   On: 01/09/2018 19:35    Labs:  CBC: Recent Labs    01/09/18 1651  01/10/18 1058 01/12/18 0443 01/13/18 0354 01/14/18 1015  WBC 22.2*  --   --  26.3* 23.8* 19.5*  HGB 6.4*   < > 7.8* 9.1* 8.4* 8.9*  HCT 21.7*   < > 24.2* 29.1* 26.7* 27.3*  PLT 387  --   --  360 340 338   < > = values in this interval not displayed.    COAGS: Recent Labs    11/09/17 1703 01/09/18 1651  INR 1.09 1.21    BMP: Recent Labs    01/12/18 0443 01/13/18 0354 01/14/18 1015 01/15/18 0410  NA 138 136 136 137  K 3.4* 3.8 3.5 3.5  CL 101 100 102 100  CO2 23 23 24 26   GLUCOSE 115* 112* 126*  109*  BUN 79* 89* 102* 107*  CALCIUM 7.7* 7.8* 8.1* 8.0*  CREATININE 2.78* 2.96* 3.01* 3.03*  GFRNONAA 21* 19* 19* 19*  GFRAA 24* 22* 22* 22*    LIVER FUNCTION TESTS: Recent Labs    12/21/17 0802 01/09/18 1204 01/09/18 1651 01/13/18 0354 01/14/18 1015 01/15/18 0410  BILITOT 0.3 0.4 0.4 0.5  --   --   AST 18 54* 56* 40  --   --   ALT 12 30 33 41  --   --   ALKPHOS 79 80 82 92  --   --   PROT 5.2* 4.9* 4.7* 4.7*  --   --   ALBUMIN 2.5* 1.8* 1.6* 1.6* 1.7* 1.8*    TUMOR MARKERS: No results for input(s): AFPTM, CEA, CA199, CHROMGRNA in the last 8760 hours.  Assessment and Plan: Progressive renal failure. Plan for [facility] tomorrow. Elevated WBC, trending down. No clear infectious source, afebrile Risks and benefits of image guided dialysis placement was discussed with the patient including, but not limited to bleeding, infection, pneumothorax, or fibrin sheath development and need for additional procedures.  All of the patient's questions were answered, patient is agreeable to proceed. Consent signed and in chart.    Thank you for this interesting consult.  I greatly enjoyed meeting Andrew Guerra and look forward to participating in their care.  A copy of this report was sent to the requesting provider on this date.  Electronically Signed: Ascencion Dike, PA-C 01/15/2018, 11:07 AM   I spent a total of 20 minutes in face to face in clinical consultation, greater than 50% of which was counseling/coordinating care for dialysis catheter

## 2018-01-15 NOTE — Progress Notes (Signed)
[facility] Progress Note  Name: Andrew Guerra MRN: [id number] DOB: Sep 02, 1940  Chief Complaint:  Swelling and GI bleed  Subjective:  Started on lasix gtt at 10 mg/hr yesterday afternoon.  He spilled his urinal so strict ins/outs not available.  Had only 225 mL UOP charted prior to that.  Spoke with patient as well as his wife via phone.  We discussed worsening renal function and indications/risks/benefits of dialysis and they have agreed to start dialysis.  He states that he has had some urine output with the lasix infusion - not much.  Review of systems:   Hx of confusion yesterday Reports shortness of breath with exertion No n/v Denies chest pain  --------------------------- Background on Consult:  BELAL SCALLON is a 77 y.o. male with a history of HTN and aortic stenosis who presented with GI bleed and hemoglobin 6.5 with PCP and report of black stool.  Found also with fluid overload/anasarca and CHF.  He has been evaluated by cardiology and has received lasix BID; only had 150 mL UOP charted on 12/26.  Work-up thus far notable for renal ultrasound without hydro and with trace abdominal ascites and prostatomegaly.  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  Spoke with patient as well a his wife and daughter.  He reports that his shortness of breath and swelling have worsened since around October of this year.  He reports that he had been on lasix daily for a couple of weeks prior to admission and had just been changed to torsemide when admitted.  He does have some urinary hesitancy and weak stream.  He has followed with urology previously for elevated PSA (which he reports to be around 7) but has not seen nephrology.  He denies NSAID use.  He would want dialysis if indicated.   Work-up has included ANA neg   Intake/Output Summary (Last 24 hours) at 01/15/2018 0750 Last data filed at 01/15/2018 0439 Gross per 24 hour  Intake 306.05 ml   Output 225 ml  Net 81.05 ml    Vitals:  Vitals:   01/14/18 1939 01/14/18 2019 01/14/18 2020 01/15/18 0539  BP:  (!) 111/56  112/70  Pulse:  79  82  Resp:   19 18  Temp:  (!) 97.4 F (36.3 C)  (!) 97.3 F (36.3 C)  TempSrc:  Oral  Oral  SpO2: 98%  91% 92%  Weight:      Height:         Physical Exam:  General adult male in bed in no acute distress HEENT normocephalic atraumatic extraocular movements intact sclera anicteric Neck supple trachea midline Lungs clear to auscultation bilaterally normal work of breathing at rest; on supplemental oxygen per nasal cannula  Heart regular rate and rhythm no rubs or gallops appreciated Abdomen soft nontender nondistended Extremities 2+ hard edema bilateral lower extremities; stockings in place Psych normal mood and affect GU no foley catheter Neuro alert and oriented x 3; conversant   Medications reviewed   Labs:  BMP Latest Ref Rng & Units 01/15/2018 01/14/2018 01/13/2018  Glucose 70 - 99 mg/dL 109(H) 126(H) 112(H)  BUN 8 - 23 mg/dL 107(H) 102(H) 89(H)  Creatinine 0.61 - 1.24 mg/dL 3.03(H) 3.01(H) 2.96(H)  BUN/Creat Ratio 10 - 24 - - -  Sodium 135 - 145 mmol/L 137 136 136  Potassium 3.5 - 5.1 mmol/L 3.5 3.5 3.8  Chloride 98 - 111 mmol/L 100 102 100  CO2 22 - 32 mmol/L 26  24 23  Calcium 8.9 - 10.3 mg/dL 8.0(L) 8.1(L) 7.8(L)    Assessment/Plan:  # AKI  - Suspect AKI is pre-renal; note profound hypoalbuminemia/third spacing and intravascularly depleted.  Note UA with protein but no 0-5 RBC's.  ANA negative.  UP/cr ratio with 1490 mg/g - Worsening azotemia and continued peripheral volume overload not responsive to lasix   - Patient has agreed to ash split catheter with IR on 12/30 and starting hemodialysis on 12/30 after line placement.  Plan for daily HD initially to optimize clearance and volume - NPO after midnight tonight for procedure 12/30 - Foley placement  - Not on aspirin (would remain off); would consider biopsy once  resp status optimized  # CKD stage III  - Multifactorial with HTN, CHF on diuretics as well as prostatomegaly - Baseline Cr noted around 1.8  # Acute on chronic diastolic CHF  - Lasix gtt at 10 mg/hr until access obtained for HD  - Redose albumin  - s/p metolazone once    # Proteinuria - SPEP pending; UPEP and serum free light chains ordered as misc send out tests   - HbA1c normal at 5.0  # Acute hypoxic respiratory failure  - optimize volume with HD once access obtained  # Acute GI bleed - GI is following  - s/p PRBC's  # Anemia - secondary to acute blood loss  - Stable/improved and s/p PRBC's   # Aortic stenosis - Noted - Cardiology following   # Prostatomegaly  - started flomax (new this admission) for prostatomegaly with hesitancy - Flomax paused with new confusion/marginal BP - Placing foley as above  Claudia Desanctis, MD 01/15/2018 7:50 AM

## 2018-01-15 NOTE — Progress Notes (Signed)
Patient Demographics:    Andrew Guerra, is a 77 y.o. male, DOB - [birth year], CWC:[id number]  Admit date - 01/09/2018   Admitting Physician [name], DO  Outpatient Primary MD for the patient is [name], MD  LOS - 6   Chief Complaint  Patient presents with  . 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 The degree of leftward septal   shift and D shaped septum has worsened. There is now a   trivial-small pericardial effusion. Side by side comparison of   images performed  Brief Summary 77 y.o. male with a PMH of HTN, HLD, bicuspid aortic valve with moderate AS, NSCLC s/p resection and radiation, and PUD admitted from PCPs office on 01/09/2018 with hemoglobin of 6.4, patient with volume overload and anasarca, patient will be initiated on hemodialysis on 01/16/2018 to try to address his volume status  Plan:- 1)Acute Gi Bleed--- status post EGD on 01/10/2018 with nonbleeding gastric and duodenal ulcers, biopsies are pending, continue PPI twice  daily, monitor H&H and transfuse as clinically indicated, hemoglobin is stable at 8.9  posttransfusion of 2 units of packed cells this admission  2)HFpEF--acute on chronic diastolic dysfunction CHF exacerbation, last known EF 60 to 65% with moderate aortic stenosis... Patient with anasarca, evidence of left and right heart failure, cardiology consult appreciated,  patient has significant/massive volume overload in the setting of low albumin and worsening renal function, echo from 01/13/2018 with preserved EF of 60 to 65% however please see full echo report above, patient will probably need right heart cath if renal function improves or if he is on dialysis----after further discussions with nephrology and cardiology , c/n  Lasix drip at 10 mg/hour for now,  received metolazone x1 dose and albumin x1, patient will be initiated on hemodialysis on 01/16/2018 to try to address his volume status  3)AKI----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 Scheduled to see hematology outpatient but not until 03/2018--- be judicious with blood transfusion  7) Moderate aortic stenosis--- most likely contributing to #2 above, repeat echo EF 60 to 65%, degree of stenosis is not worse, patient  will need right heart cath after initiation on hemodialysis   8)FEN--poor oral intake, weight loss and low albumin----nutritional supplements advised, patient albumin has been less than 2, c/n need IV albumin with IV diuretics until patient is initiated on HD  9)NSCLC - post prior surgery and radiation, patient goes to [facility] every 6 months Looks like patient had this surgically resected, then had recurrence followed by SBRT with shrinking of the recurrent tumor according to oncology office visit notes.  10) transient episode of confusion/delirium/disorientation--- ???  Uremia related, now resolved,, continue to monitor closely.... May be hospital psychosis, at patient's wife request will stop Flomax  Disposition/Need for in-Hospital Stay- patient unable to be discharged at this time due to significant worsening of heart failure, Renal failure and volume overload requiring further diuresis and monitoring (see subjective section above), patient will need HD catheter and initiation on hemodialysis, patient will need outpatient hemodialysis chair/spot prior to discharge  Code Status : Full  Family Communication:   Wife at bedside  Disposition Plan  : To be determined  Consults  : Cardiology/GI/Nephrology/IR for HD catheter placement.... Consider hematology consult  DVT Prophylaxis  :  TEDS/ SCDs   Lab Results  Component Value Date   PLT 338 01/14/2018    Inpatient Medications  Scheduled Meds: . carvedilol  6.25 mg Oral BID WC  . feeding supplement (ENSURE ENLIVE)  237 mL Oral BID BM  . feeding supplement (NEPRO CARB STEADY)  237 mL Oral TID BM  . mouth rinse  15 mL Mouth Rinse BID  . mometasone-formoterol  2 puff Inhalation BID  . multivitamin with minerals  1 tablet Oral Daily  . Netarsudil-Latanoprost  1 drop Both Eyes QHS  . pantoprazole  40 mg Oral BID   Continuous Infusions: . [START ON 01/16/2018]  ceFAZolin (ANCEF) IV    . furosemide (LASIX) infusion 10 mg/hr (01/14/18 2120)    PRN Meds:.acetaminophen **OR** acetaminophen, ondansetron **OR** ondansetron (ZOFRAN) IV   Anti-infectives (From admission, onward)   Start     Dose/Rate Route Frequency Ordered Stop   01/16/18 1230  ceFAZolin (ANCEF) IVPB 2g/100 mL premix    Note to Pharmacy:  Pre IR procedure, procedure time 12/30 not yet known   2 g 200 mL/hr over 30 Minutes Intravenous To Surgery 01/15/18 1112 01/17/18 1230        Objective:   Vitals:   01/15/18 0905 01/15/18 1015 01/15/18 1359 01/15/18 1806  BP:  125/71 115/80 (!) 142/74  Pulse:  81 74 84  Resp:   19   Temp:   97.6 F (36.4 C)   TempSrc:   Oral   SpO2: 94% 93% 98%   Weight:      Height:        Wt Readings from Last 3 Encounters:  01/09/18 90.7 kg  01/09/18 99.7 kg  12/14/17 88.5 kg     Intake/Output Summary (Last 24 hours) at 01/15/2018 1900 Last data filed at 01/15/2018 2841 Gross  per 24 hour  Intake 66.05 ml  Output -  Net 66.05 ml    Physical Exam Patient is examined daily including today on 01/15/18  , exams remain the same as of yesterday except that has changed   Gen:- Awake Alert, dyspnea on minimal exertion HEENT:- [place].AT, No sclera icterus Neck-Supple Neck, +ve JVD,.  Lungs-diminished in bases scattered rales in bases, no wheezing CV- S1, S2 Andrew, regular 3/6 SM Abd-  +ve B.Sounds, Abd Soft, No tenderness,    Extremity/Skin:-3+ edema/anasarca extending from the toes into the lower abdomen with  involvement of the penis and scrotum, has Thigh High TEDs, pedal pulses present  Psych-affect is appropriate, oriented x3 (had confusional episodes earlier in the morning) Neuro-no new focal deficits, no tremors GU--- patient with  scrotal and penile edema, has foley  Data Review:   Micro Results Recent Results (from the past 240 hour(s))  Urine Culture     Status: Abnormal   Collection Time: 01/13/18  8:25 AM  Result Value Ref Range Status   Specimen Description URINE, CLEAN CATCH  Final   Special Requests    Final    Andrew Performed at [facility], [place] [address] [place], [place] [address]    Culture (A)  Final    >=100,000 COLONIES/mL MULTIPLE SPECIES PRESENT, SUGGEST RECOLLECTION   Report Status 01/14/2018 FINAL  Final  Culture, blood (Routine X 2) w Reflex to ID Panel     Status: None (Preliminary result)   Collection Time: 01/13/18  8:50 AM  Result Value Ref Range Status   Specimen Description BLOOD BLOOD LEFT HAND  Final   Special Requests   Final    BOTTLES DRAWN AEROBIC AND ANAEROBIC Blood Culture adequate volume   Culture   Final    NO GROWTH 2 DAYS Performed at [facility], [place] [address], [place], [place] [address]    Report Status PENDING  Incomplete    Radiology Reports Ct Abdomen Pelvis Wo Contrast  Result Date: 01/09/2018 CLINICAL DATA:  Low hemoglobin. EXAM: CT ABDOMEN AND PELVIS WITHOUT CONTRAST TECHNIQUE: Multidetector CT imaging of the abdomen and pelvis was performed following the standard protocol without IV contrast. COMPARISON:  CT 02/04/2015, abdomen MRI 12/09/2017 FINDINGS: Lower chest: Small bilateral pleural effusions. Cardiomegaly without pericardial effusion or thickening. Atherosclerosis of the included thoracic aorta without aneurysm. Coronary arteriosclerosis is seen. Pulmonary consolidation/atelectasis in the right middle lobe distribution. Hepatobiliary: Layering biliary sludge within the gallbladder without secondary signs of acute cholecystitis. The unenhanced liver demonstrates a granuloma in the right hepatic lobe. No definite mass or biliary dilatation. Pancreas: Unremarkable. No pancreatic ductal dilatation or surrounding inflammatory changes. Spleen: Andrew size spleen with scattered calcifications noted within possibly representing vascular calcifications or granulomata. Adrenals/Urinary Tract: Scattered bilateral hyperdense lesions of the kidneys are nonspecific but may represent proteinaceous or hemorrhagic cysts, the largest is  right-sided and interpolar measuring up 1.6 cm with more ill-defined hyperdensity slightly exophytic off the right kidney. No nephrolithiasis or obstructive uropathy. The urinary bladder is unremarkable for the degree of distention. Stomach/Bowel: Stomach is within Andrew limits. The appendix is not confidently identified. No evidence of bowel wall thickening, distention, or inflammatory changes. Vascular/Lymphatic: Aortic atherosclerosis. No lymphadenopathy. Reproductive: Prostatomegaly measuring up to 5.7 cm. Other: Diffuse soft tissue anasarca. No ascites or free air. 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 IMPRESSION: 1. Pulmonary consolidation/atelectasis in the right middle lobe with small bilateral pleural effusions. 2. Scattered bilateral hyperdense lesions of the kidneys likely to  represent proteinaceous or hemorrhagic cysts, the largest is in the right interpolar and interpolar measuring 1.6 cm with more ill-defined hyperdensity slightly exophytic off the right kidney. Findings better characterized on recent prior MRI. 3. Hepatic and splenic granulomata. 4. Diffuse soft tissue anasarca. 5. 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 Aortic Atherosclerosis (ICD10-I70.0). Electronically Signed   By: Ashley Royalty M.D.   On: 01/09/2018 20:33   Dg Chest 2 View  Result Date: 01/14/2018 CLINICAL DATA:  Shortness of breath EXAM: CHEST - 2 VIEW COMPARISON:  Chest radiograph 01/09/2018 FINDINGS: Monitoring leads overlie the patient. Stable cardiac and mediastinal contours. Similar-appearing patchy consolidation within the right greater than left lower lungs. Moderate right and small left pleural effusions. No pneumothorax. IMPRESSION: Similar-appearing patchy consolidation within the right greater than left lower lungs with moderate right and small left pleural effusions. Electronically Signed   By: Lovey Newcomer M.D.   On: 01/14/2018 16:08   US Renal  Result Date: 01/13/2018 CLINICAL DATA:   Oliguria.  Elevated PSA. EXAM: RENAL / URINARY TRACT ULTRASOUND COMPLETE COMPARISON:  CT abdomen and pelvis 01/09/2018. FINDINGS: Right Kidney: Renal measurements: 10.5 x 4.6 x 5.0 cm = volume: 126.8 mL . Echogenicity within Andrew limits. A simple cyst measuring 1.9 cm in diameter is identified. No hydronephrosis visualized. Trace amount of perihepatic ascites is noted. Left Kidney: Renal measurements: 11.4 x 5.5 x 5.1 cm = volume: 163.2 mL. Echogenicity within Andrew limits. No mass or hydronephrosis visualized. Bladder: The bladder is almost completely decompressed. No focal abnormality. Prostatomegaly is seen. IMPRESSION: Negative for hydronephrosis or acute abnormality. Prostatomegaly. Trace amount of ascites. Electronically Signed   By: Inge Rise M.D.   On: 01/13/2018 11:29   Dg Chest Portable 1 View  Result Date: 01/09/2018 CLINICAL DATA:  Shortness of breath for 1 day. 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 EXAM: PORTABLE CHEST 1 VIEW COMPARISON:  Radiographs 11/09/2017. Abdominal MRI 12/09/2017. PET-CT 11/25/2015. FINDINGS: 1902 hours. There are right-greater-than-left pleural effusions which have enlarged. There is associated bibasilar pulmonary opacity, likely atelectasis. There are stable postsurgical changes in the right hilar region. No pneumothorax or acute osseous findings are seen. IMPRESSION: Enlarging right-greater-than-left pleural effusions with associated bibasilar pulmonary opacities, likely atelectasis. No invert pulmonary edema. Electronically Signed   By: Richardean Sale M.D.   On: 01/09/2018 19:35     CBC Recent Labs  Lab 01/09/18 1204 01/09/18 1651 01/10/18 0406 01/10/18 1058 01/12/18 0443 01/13/18 0354 01/14/18 1015  WBC 22.0* 22.2*  --   --  26.3* 23.8* 19.5*  HGB 6.4* 6.4* 7.9* 7.8* 9.1* 8.4* 8.9*  HCT 21.1* 21.7* 25.1* 24.2* 29.1* 26.7* 27.3*  PLT 361 387  --   --  360 340 338  MCV 77.0* 78.1*  --   --  75.6* 74.6* 75.6*  MCH 23.4* 23.0*  --    --  23.6* 23.5* 24.7*  MCHC 30.3 29.5*  --   --  31.3 31.5 32.6  RDW 22.0* 21.8*  --   --  21.0* 20.9* 21.0*  LYMPHSABS 0.5*  --   --   --   --   --   --   MONOABS 1.2*  --   --   --   --   --   --   EOSABS 0.0  --   --   --   --   --   --   BASOSABS 0.0  --   --   --   --   --   --  Chemistries  Recent Labs  Lab 01/09/18 1204 01/09/18 1651 01/10/18 0406 01/12/18 0443 01/13/18 0354 01/14/18 1015 01/15/18 0410  NA 136 138 137 138 136 136 137  K 3.0* 3.6 3.3* 3.4* 3.8 3.5 3.5  CL 104 102 102 101 100 102 100  CO2 21* 21* 21* 23 23 24 26   GLUCOSE 107* 106* 103* 115* 112* 126* 109*  BUN 78* 76* 76* 79* 89* 102* 107*  CREATININE 2.60* 2.72* 2.48* 2.78* 2.96* 3.01* 3.03*  CALCIUM 7.5* 7.6* 7.6* 7.7* 7.8* 8.1* 8.0*  AST 54* 56*  --   --  40  --   --   ALT 30 33  --   --  41  --   --   ALKPHOS 80 82  --   --  92  --   --   BILITOT 0.4 0.4  --   --  0.5  --   --    ------------------------------------------------------------------------------------------------------------------ No results for input(s): CHOL, HDL, LDLCALC, TRIG, CHOLHDL, LDLDIRECT in the last 72 hours.  Lab Results  Component Value Date   HGBA1C 5.0 01/14/2018   ------------------------------------------------------------------------------------------------------------------  Coagulation profile Recent Labs  Lab 01/09/18 1651  INR 1.21    -----------------------------------------------------------------------------------------------------------------    Component Value Date/Time   BNP 598.0 (H) 01/09/2018 1205    Harbor Paster M.D on 01/15/2018 at 7:00 PM  Pager---[phone] Go to [email or web address] - password [email or web address] for contact info  [facility] - Office  [phone]

## 2018-01-15 NOTE — Progress Notes (Addendum)
DAILY PROGRESS NOTE   Patient Name: Andrew Guerra Date of Encounter: 01/15/2018  Chief Complaint    Still SOB   No CP    Patient Profile   Andrew Guerra is a 77 y.o. male with a PMH of HTN, HLD, bicuspid aortic valve with moderate AS, NSCLC s/p resection and radiation, and PUD, who is being seen today for the evaluation of CHF at the request of Dr. Denton Brick.  Subjective    Objective   Vitals:   01/14/18 2019 01/14/18 2020 01/15/18 0539 01/15/18 0905  BP: (!) 111/56  112/70   Pulse: 79  82   Resp:  19 18   Temp: (!) 97.4 F (36.3 C)  (!) 97.3 F (36.3 C)   TempSrc: Oral  Oral   SpO2:  91% 92% 94%  Weight:      Height:        Intake/Output Summary (Last 24 hours) at 01/15/2018 1005 Last data filed at 01/15/2018 0439 Gross per 24 hour  Intake 306.05 ml  Output 225 ml  Net 81.05 ml   Net negative 1.1 L  Filed Weights   01/09/18 1633  Weight: 90.7 kg    Physical Exam  Neck:  JVP is elevated Lungs are relatively clear     Cardiac exam:   RRR   GR II/VI systolic murmur base Abd is obese  Distended Ext:   2 + edema  Inpatient Medications    Scheduled Meds: . carvedilol  6.25 mg Oral BID WC  . feeding supplement (ENSURE ENLIVE)  237 mL Oral BID BM  . mouth rinse  15 mL Mouth Rinse BID  . mometasone-formoterol  2 puff Inhalation BID  . multivitamin with minerals  1 tablet Oral Daily  . Netarsudil-Latanoprost  1 drop Both Eyes QHS  . pantoprazole  40 mg Oral BID    Continuous Infusions: . albumin human    . furosemide (LASIX) infusion 10 mg/hr (01/14/18 2120)    PRN Meds: acetaminophen **OR** acetaminophen, ondansetron **OR** ondansetron (ZOFRAN) IV   Labs   Results for orders placed or performed during the hospital encounter of 01/09/18 (from the past 48 hour(s))  Urinalysis, Routine w reflex microscopic     Status: Abnormal   Collection Time: 01/13/18 10:58 AM  Result Value Ref Range   Color, Urine YELLOW YELLOW   APPearance HAZY (A) CLEAR   Specific Gravity, Urine 1.014 1.005 - 1.030   pH 5.0 5.0 - 8.0   Glucose, UA NEGATIVE NEGATIVE mg/dL   Hgb urine dipstick NEGATIVE NEGATIVE   Bilirubin Urine NEGATIVE NEGATIVE   Ketones, ur NEGATIVE NEGATIVE mg/dL   Protein, ur 100 (A) NEGATIVE mg/dL   Nitrite NEGATIVE NEGATIVE   Leukocytes, UA NEGATIVE NEGATIVE   RBC / HPF 0-5 0 - 5 RBC/hpf   WBC, UA 0-5 0 - 5 WBC/hpf   Bacteria, UA NONE SEEN NONE SEEN   Squamous Epithelial / LPF 0-5 0 - 5   Mucus PRESENT    Hyaline Casts, UA PRESENT    Granular Casts, UA PRESENT     Comment: Performed at [facility], [address] [place], [place] [address]  Prepare RBC     Status: None   Collection Time: 01/13/18 12:02 PM  Result Value Ref Range   Order Confirmation ORDER PROCESSED BY BLOOD BANK   Type and screen [place]     Status: None   Collection Time: 01/13/18 12:04 PM  Result Value Ref Range  ABO/RH(D) B POS    Antibody Screen NEG    Sample Expiration 01/16/2018    Unit Number [id number]    Blood Component Type RBC LR PHER1    Unit division 00    Status of Unit      ISSUED,FINAL Performed at [facility], [address], [place], [place] [address]    Transfusion Status OK TO TRANSFUSE    Crossmatch Result Compatible   Protein / creatinine ratio, urine     Status: Abnormal   Collection Time: 01/13/18  9:15 PM  Result Value Ref Range   Creatinine, Urine 160.67 mg/dL   Total Protein, Urine 240 mg/dL    Comment: NO NORMAL RANGE ESTABLISHED FOR THIS TEST RESULTS CONFIRMED BY MANUAL DILUTION    Protein Creatinine Ratio 1.49 (H) 0.00 - 0.15 mg/mg[Cre]    Comment: Performed at [facility] [address], [place], [place] [address]  CBC     Status: Abnormal   Collection Time: 01/14/18 10:15 AM  Result Value Ref Range   WBC 19.5 (H) 4.0 - 10.5 K/uL   RBC 3.61 (L) 4.22 - 5.81 MIL/uL   Hemoglobin 8.9 (L) 13.0 - 17.0 g/dL    Comment: Reticulocyte Hemoglobin testing may be clinically  indicated, consider ordering this additional test [id number]    HCT 27.3 (L) 39.0 - 52.0 %   MCV 75.6 (L) 80.0 - 100.0 fL   MCH 24.7 (L) 26.0 - 34.0 pg   MCHC 32.6 30.0 - 36.0 g/dL   RDW 21.0 (H) 11.5 - 15.5 %   Platelets 338 150 - 400 K/uL   nRBC 0.4 (H) 0.0 - 0.2 %    Comment: Performed at [facility], [place] [address] [place], [place] [address]  Renal function panel     Status: Abnormal   Collection Time: 01/14/18 10:15 AM  Result Value Ref Range   Sodium 136 135 - 145 mmol/L   Potassium 3.5 3.5 - 5.1 mmol/L   Chloride 102 98 - 111 mmol/L   CO2 24 22 - 32 mmol/L   Glucose, Bld 126 (H) 70 - 99 mg/dL   BUN 102 (H) 8 - 23 mg/dL   Creatinine, Ser 3.01 (H) 0.61 - 1.24 mg/dL   Calcium 8.1 (L) 8.9 - 10.3 mg/dL   Phosphorus 4.6 2.5 - 4.6 mg/dL   Albumin 1.7 (L) 3.5 - 5.0 g/dL   GFR calc non Af Amer 19 (L) >60 mL/min   GFR calc Af Amer 22 (L) >60 mL/min   Anion gap 10 5 - 15    Comment: Performed at [facility] [address] [place], [place] [address]  Hemoglobin A1c     Status: None   Collection Time: 01/14/18 10:15 AM  Result Value Ref Range   Hgb A1c MFr Bld 5.0 4.8 - 5.6 %    Comment: (NOTE) Pre diabetes:          5.7%-6.4% Diabetes:              >6.4% Glycemic control for   <7.0% adults with diabetes    Mean Plasma Glucose 96.8 mg/dL    Comment: Performed at [facility] [address], [place], [place] [address]  Renal function panel     Status: Abnormal   Collection Time: 01/15/18  4:10 AM  Result Value Ref Range   Sodium 137 135 - 145 mmol/L   Potassium 3.5 3.5 - 5.1 mmol/L   Chloride 100 98 -  111 mmol/L   CO2 26 22 - 32 mmol/L   Glucose, Bld 109 (H) 70 - 99 mg/dL   BUN 107 (H) 8 - 23 mg/dL   Creatinine, Ser 3.03 (H) 0.61 - 1.24 mg/dL   Calcium 8.0 (L) 8.9 - 10.3 mg/dL   Phosphorus 4.5 2.5 - 4.6 mg/dL   Albumin 1.8 (L) 3.5 - 5.0 g/dL   GFR calc non Af Amer 19 (L) >60 mL/min   GFR calc Af Amer 22 (L) >60 mL/min   Anion gap 11 5 - 15     Comment: Performed at [facility] [address], [place], [place] [address]    ECG   N/A  Telemetry   Sinus rhythm - Personally Reviewed  Radiology    Dg Chest 2 View  Result Date: 01/14/2018 CLINICAL DATA:  Shortness of breath EXAM: CHEST - 2 VIEW COMPARISON:  Chest radiograph 01/09/2018 FINDINGS: Monitoring leads overlie the patient. Stable cardiac and mediastinal contours. Similar-appearing patchy consolidation within the right greater than left lower lungs. Moderate right and small left pleural effusions. No pneumothorax. IMPRESSION: Similar-appearing patchy consolidation within the right greater than left lower lungs with moderate right and small left pleural effusions. Electronically Signed   By: Lovey Newcomer M.D.   On: 01/14/2018 16:08   US Renal  Result Date: 01/13/2018 CLINICAL DATA:  Oliguria.  Elevated PSA. EXAM: RENAL / URINARY TRACT ULTRASOUND COMPLETE COMPARISON:  CT abdomen and pelvis 01/09/2018. FINDINGS: Right Kidney: Renal measurements: 10.5 x 4.6 x 5.0 cm = volume: 126.8 mL . Echogenicity within normal limits. A simple cyst measuring 1.9 cm in diameter is identified. No hydronephrosis visualized. Trace amount of perihepatic ascites is noted. Left Kidney: Renal measurements: 11.4 x 5.5 x 5.1 cm = volume: 163.2 mL. Echogenicity within normal limits. No mass or hydronephrosis visualized. Bladder: The bladder is almost completely decompressed. No focal abnormality. Prostatomegaly is seen. IMPRESSION: Negative for hydronephrosis or acute abnormality. Prostatomegaly. Trace amount of ascites. Electronically Signed   By: Inge Rise M.D.   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   Malignant neoplasm of left lung (HCC)   Acute on chronic diastolic CHF (congestive heart failure) (HCC)    Malnutrition of moderate degree   Iron deficiency anemia   Anasarca   Intestinal metaplasia of gastric mucosa   Plan   1 CHF   Echo with LVEF 65%  RV is mod to severely dilated with moderate RV systolic dysfunction   Volume overload in  setting of hypoalbuminemia   Alb 1.8 Pt says that up untll last summer he had no edema and was going to gym 3x per week     Echo last April 2019 with mild  mod RV dysfuncton  Mod AS RV now is moderarely reduced   Evd  Of volume / pressure overload  Note plans for dialysis tomorrow   WOuld get COOX when catheter placed     Patient should still have R heart cath   Can be done after dialysis   Cuase for RV dysfunction needed   WOuld continue lasix gtt now     2  Bicuspid AV  Mean gradient 22 mm Hg   3   GI   Pt presented with Hgb 6.4  IMproved  GI following   EGD with  nonbleeding ulcers    4  CKD   BUN/Cr 107/3    Dialysis tomorrow.    5  Anemia  Hgb 8.9yesterday    Dorris Carnes 01/15/2018, 10:05 AM

## 2018-01-16 ENCOUNTER — Inpatient Hospital Stay (HOSPITAL_COMMUNITY): Payer: Medicare Other

## 2018-01-16 ENCOUNTER — Encounter (HOSPITAL_COMMUNITY): Payer: Self-pay | Admitting: Interventional Radiology

## 2018-01-16 DIAGNOSIS — D500 Iron deficiency anemia secondary to blood loss (chronic): Secondary | ICD-10-CM

## 2018-01-16 DIAGNOSIS — K264 Chronic or unspecified duodenal ulcer with hemorrhage: Secondary | ICD-10-CM

## 2018-01-16 DIAGNOSIS — I50810 Right heart failure, unspecified: Secondary | ICD-10-CM

## 2018-01-16 HISTORY — PX: IR US GUIDE VASC ACCESS RIGHT: IMG2390

## 2018-01-16 HISTORY — PX: IR FLUORO GUIDE CV LINE RIGHT: IMG2283

## 2018-01-16 LAB — COMPREHENSIVE METABOLIC PANEL
ALT: 36 U/L (ref 0–44)
ANION GAP: 12 (ref 5–15)
AST: 26 U/L (ref 15–41)
Albumin: 1.9 g/dL — ABNORMAL LOW (ref 3.5–5.0)
Alkaline Phosphatase: 78 U/L (ref 38–126)
BUN: 107 mg/dL — ABNORMAL HIGH (ref 8–23)
CO2: 26 mmol/L (ref 22–32)
Calcium: 8 mg/dL — ABNORMAL LOW (ref 8.9–10.3)
Chloride: 99 mmol/L (ref 98–111)
Creatinine, Ser: 2.84 mg/dL — ABNORMAL HIGH (ref 0.61–1.24)
GFR calc Af Amer: 23 mL/min — ABNORMAL LOW (ref >60)
GFR calc non Af Amer: 20 mL/min — ABNORMAL LOW (ref >60)
GLUCOSE: 126 mg/dL — AB (ref 70–99)
Potassium: 3.6 mmol/L (ref 3.5–5.1)
Sodium: 137 mmol/L (ref 135–145)
Total Bilirubin: 0.4 mg/dL (ref 0.3–1.2)
Total Protein: 4.7 g/dL — ABNORMAL LOW (ref 6.5–8.1)

## 2018-01-16 LAB — CBC
HEMATOCRIT: 28.2 % — AB (ref 39.0–52.0)
Hemoglobin: 9.1 g/dL — ABNORMAL LOW (ref 13.0–17.0)
MCH: 24.6 pg — ABNORMAL LOW (ref 26.0–34.0)
MCHC: 32.3 g/dL (ref 30.0–36.0)
MCV: 76.2 fL — ABNORMAL LOW (ref 80.0–100.0)
Platelets: 335 10*3/uL (ref 150–400)
RBC: 3.7 MIL/uL — ABNORMAL LOW (ref 4.22–5.81)
RDW: 21.6 % — AB (ref 11.5–15.5)
WBC: 20.1 10*3/uL — ABNORMAL HIGH (ref 4.0–10.5)
nRBC: 0.2 % (ref 0.0–0.2)

## 2018-01-16 LAB — PHOSPHORUS: Phosphorus: 4.6 mg/dL (ref 2.5–4.6)

## 2018-01-16 MED ORDER — METOPROLOL TARTRATE 5 MG/5ML IV SOLN
5.0000 mg | Freq: Once | INTRAVENOUS | Status: AC
  Administered 2018-01-16: 5 mg via INTRAVENOUS

## 2018-01-16 MED ORDER — METOPROLOL TARTRATE 5 MG/5ML IV SOLN
INTRAVENOUS | Status: AC
  Administered 2018-01-16: 5 mg via INTRAVENOUS
  Filled 2018-01-16: qty 5

## 2018-01-16 MED ORDER — CHLORHEXIDINE GLUCONATE CLOTH 2 % EX PADS
6.0000 | MEDICATED_PAD | Freq: Every day | CUTANEOUS | Status: DC
  Administered 2018-01-17: 6 via TOPICAL

## 2018-01-16 MED ORDER — HEPARIN SODIUM (PORCINE) 1000 UNIT/ML IJ SOLN
INTRAMUSCULAR | Status: AC
  Filled 2018-01-16: qty 3

## 2018-01-16 MED ORDER — HEPARIN SODIUM (PORCINE) 1000 UNIT/ML IJ SOLN
INTRAMUSCULAR | Status: AC | PRN
  Administered 2018-01-16: 2600 [IU] via INTRAVENOUS
  Administered 2018-01-18: 3200 [IU] via INTRAVENOUS
  Administered 2018-01-20: 1000 [IU] via INTRAVENOUS
  Administered 2018-01-24: 2600 [IU] via INTRAVENOUS

## 2018-01-16 MED ORDER — LIDOCAINE HCL 1 % IJ SOLN
INTRAMUSCULAR | Status: AC | PRN
  Administered 2018-01-16: 5 mL

## 2018-01-16 MED ORDER — HEPARIN SODIUM (PORCINE) 1000 UNIT/ML IJ SOLN
INTRAMUSCULAR | Status: AC
  Filled 2018-01-16: qty 1

## 2018-01-16 MED ORDER — LIDOCAINE HCL 1 % IJ SOLN
INTRAMUSCULAR | Status: AC
  Filled 2018-01-16: qty 20

## 2018-01-16 NOTE — Progress Notes (Signed)
Telemetry  Notify nurse of new onset atrial fibrillation of rate 140 sustain. Doc Emokpae notify and Nurse from internal radiology are aware.

## 2018-01-16 NOTE — Care Management Important Message (Signed)
Important Message  Patient Details  Name: Andrew Guerra MRN: [id number] Date of Birth: 1940-10-10   Medicare Important Message Given:  Yes    [name] 01/16/2018, 3:48 PM

## 2018-01-16 NOTE — Progress Notes (Signed)
[facility] Progress Note   Chief Complaint:   Acute on chronic anemia / melena  SUBJECTIVE:    stool not black, lightening up now.   ASSESSMENT AND PLAN:   1. 77 yo male admitted with acute on chronic anemia ( microcytic) and black / Heme positive stools. Hx of gastric and duodenal ulcers on EGD in Oct 2019. H.pylori on path, treated  with quadruple therapy. We had since been closely monitoring his hgb as outpatient, it didn't respond to IV iron and he wasn't having any evidence for bleeding so we ultimately referred to Hematology who he sees next month . In the interim patient now hospitalized with worsening anemia and black stools. Repeat EGD this admit for >>>>> mild reflux esophagitis, non-bleeding gastric ulcers with no stigmata of bleeding. Multiple non-bleeding duodenal ulcers with no stigmata of bleeding. Overall, the findings in the stomach and duodenum were largely improved from October 2019. Gastric biopsies >>> chronic gastritis and intestinal metaplasia. Duodenal bx unremarkable. Biopsies negative for H.pylori this time . -Still not clear why he rebled as ulcers were healing..  -continue PPI -He is s/p 2 units of pRBC this admission with improvement in hgb from 6.4 to 9.1 today.  -will arrange for outpatient CBC at our office next week  2. CHF, volume overload. Eventual R heart cath. Cardiology following. He also has AKI on CKD. BUN >100, sounds like he is for dialysis in near future   OBJECTIVE:     Vital signs in last 24 hours: Temp:  [97.3 F (36.3 C)-97.9 F (36.6 C)] 97.3 F (36.3 C) (12/30 1424) Pulse Rate:  [67-130] 83 (12/30 1424) Resp:  [18] 18 (12/29 2035) BP: (110-145)/(63-89) 145/81 (12/30 1424) SpO2:  [82 %-100 %] 94 % (12/30 1205) Last BM Date: 01/14/18 General:   Alert, well-developed male in NAD EENT:  Normal hearing, non icteric sclera, conjunctive pink.  Heart:  Regular rate, irreg rhythm. BLE pitting edema   Pulm: Breathing  slightly labored on 02. Normal respiratory effort, lungs CTA bilaterally without wheezes or crackles. Abdomen:  Soft, nondistended, nontender.  Normal bowel sounds, no masses felt.       Neurologic:  Alert and  oriented x4;  grossly normal neurologically. Psych:  Pleasant, cooperative.  Normal mood and affect.   Intake/Output from previous day: 12/29 0701 - 12/30 0700 In: 152.5 [I.V.:152.5] Out: 300 [Urine:300] Intake/Output this shift: No intake/output data recorded.  Lab Results: Recent Labs    01/14/18 1015 01/16/18 0456  WBC 19.5* 20.1*  HGB 8.9* 9.1*  HCT 27.3* 28.2*  PLT 338 335   BMET Recent Labs    01/14/18 1015 01/15/18 0410 01/16/18 0456  NA 136 137 137  K 3.5 3.5 3.6  CL 102 100 99  CO2 24 26 26   GLUCOSE 126* 109* 126*  BUN 102* 107* 107*  CREATININE 3.01* 3.03* 2.84*  CALCIUM 8.1* 8.0* 8.0*   LFT Recent Labs    01/16/18 0456  PROT 4.7*  ALBUMIN 1.9*  AST 26  ALT 36  ALKPHOS 78  BILITOT 0.4   PT/INR No results for input(s): LABPROT, INR in the last 72 hours. Hepatitis Panel No results for input(s): HEPBSAG, HCVAB, HEPAIGM, HEPBIGM in the last 72 hours.  Ir Fluoro Guide Cv Line Right  Result Date: 01/16/2018 INDICATION: 77 year old male with acute renal failure in need of hemodialysis. We had an initial plan for placement of a tunneled hemodialysis catheter, however he has leukocytosis today as well as new onset  atrial fibrillation with uncontrolled rapid ventricular response. Therefore, we will place a non tunneled hemodialysis catheter to allow him to get hemodialysis quickly without the additional stress of moderate sedation. We can then convert the non tunneled catheter to a tunneled device once his other issues have been addressed. EXAM: IR RIGHT FLOURO GUIDE CV LINE; IR ULTRASOUND GUIDANCE VASC ACCESS RIGHT MEDICATIONS: None ANESTHESIA/SEDATION: None FLUOROSCOPY TIME:  Fluoroscopy Time: 0 minutes 12 seconds (1 mGy). COMPLICATIONS: None  immediate. PROCEDURE: Informed written consent was obtained from the patient after a thorough discussion of the procedural risks, benefits and alternatives. All questions were addressed. Maximal Sterile Barrier Technique was utilized including caps, mask, sterile gowns, sterile gloves, sterile drape, hand hygiene and skin antiseptic. A timeout was performed prior to the initiation of the procedure. The right internal jugular vein was interrogated with ultrasound and found to be widely patent. An image was obtained and stored for the medical record. Local anesthesia was attained by infiltration with 1% lidocaine. A small dermatotomy was made. Under real-time sonographic guidance, the vessel was punctured with an 18 gauge needle. A 0.035 wire was advanced into the inferior vena cava. The needle was removed. The skin tract was dilated and a 16 cm triple-lumen hemodialysis catheter was advanced over the wire and positioned with the catheter tip at the superior cavoatrial junction. The catheter was aspirated and then flushed with heparinized saline. The catheter was then secured to the skin with 0 Prolene suture and a sterile bandage. The patient tolerated the procedure well. IMPRESSION: Successful placement of a non tunneled hemodialysis catheter via the right internal jugular vein. The tip of the catheter is at the superior cavoatrial junction and the catheter is ready for immediate use. Electronically Signed   By: Jacqulynn Cadet M.D.   On: 01/16/2018 14:05   Ir US Guide Vasc Access Right  Result Date: 01/16/2018 INDICATION: 77 year old male with acute renal failure in need of hemodialysis. We had an initial plan for placement of a tunneled hemodialysis catheter, however he has leukocytosis today as well as new onset atrial fibrillation with uncontrolled rapid ventricular response. Therefore, we will place a non tunneled hemodialysis catheter to allow him to get hemodialysis quickly without the additional  stress of moderate sedation. We can then convert the non tunneled catheter to a tunneled device once his other issues have been addressed. EXAM: IR RIGHT FLOURO GUIDE CV LINE; IR ULTRASOUND GUIDANCE VASC ACCESS RIGHT MEDICATIONS: None ANESTHESIA/SEDATION: None FLUOROSCOPY TIME:  Fluoroscopy Time: 0 minutes 12 seconds (1 mGy). COMPLICATIONS: None immediate. PROCEDURE: Informed written consent was obtained from the patient after a thorough discussion of the procedural risks, benefits and alternatives. All questions were addressed. Maximal Sterile Barrier Technique was utilized including caps, mask, sterile gowns, sterile gloves, sterile drape, hand hygiene and skin antiseptic. A timeout was performed prior to the initiation of the procedure. The right internal jugular vein was interrogated with ultrasound and found to be widely patent. An image was obtained and stored for the medical record. Local anesthesia was attained by infiltration with 1% lidocaine. A small dermatotomy was made. Under real-time sonographic guidance, the vessel was punctured with an 18 gauge needle. A 0.035 wire was advanced into the inferior vena cava. The needle was removed. The skin tract was dilated and a 16 cm triple-lumen hemodialysis catheter was advanced over the wire and positioned with the catheter tip at the superior cavoatrial junction. The catheter was aspirated and then flushed with heparinized saline. The catheter was then secured  to the skin with 0 Prolene suture and a sterile bandage. The patient tolerated the procedure well. IMPRESSION: Successful placement of a non tunneled hemodialysis catheter via the right internal jugular vein. The tip of the catheter is at the superior cavoatrial junction and the catheter is ready for immediate use. Electronically Signed   By: Jacqulynn Cadet M.D.   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

## 2018-01-16 NOTE — Progress Notes (Signed)
   01/16/18 1120  Vital Signs  Pulse Rate (!) 130  ECG Heart Rate (!) 114  BP 114/89  Oxygen Therapy  SpO2 91 %  O2 Device Nasal Cannula  O2 Flow Rate (L/min) 4 L/min   Patient at radiology nurses station for tunneled HD cath. Received call from bedside nurse that patient is in afib rvr. Patient connected to bedside monitor, heart rate 110-140s (afib), BP 114/89. Patient denies any chest pain or palpitations and states he feels fine. Dr. Laurence Ferrari made aware of new onset, order given for 5mg  IV lopressor.   Patient given Lopressor 5mg  IV @ 1128, patient went back into NSR briefly after medication given, but then converted back to Afib.

## 2018-01-16 NOTE — Progress Notes (Signed)
Nutrition Follow-up  DOCUMENTATION CODES:   Non-severe (moderate) malnutrition in context of chronic illness  INTERVENTION:   - d/c Ensure Enlive  - Nepro Shake po TID, each supplement provides 425 kcal and 19 grams protein  - Encourage adequate PO intake  NUTRITION DIAGNOSIS:   Moderate Malnutrition related to chronic illness (PUD, CHF, NSCLC of right lung) as evidenced by mild fat depletion, moderate fat depletion, mild muscle depletion, moderate muscle depletion.  Ongoing  GOAL:   Patient will meet greater than or equal to 90% of their needs  Progressing  MONITOR:   Diet advancement, Labs, Weight trends, Supplement acceptance, I & O's  REASON FOR ASSESSMENT:   Malnutrition Screening Tool    ASSESSMENT:   77 year old male who presented to the ED on 12/23 with low hemoglobin. Pt with recent admission for GI bleed related to PUD. PMH significant for CHF, HTN, HLD, NSCLC of right lung.  12/24 - s/p EGD with non-bleeding gastric and duodenal ulcers, biopsies pending  Noted plan to start daily HD today to optimize clearance and volume. Pt to undergo TDC placement today. Pt in radiology at time of RD visit. Noted temporary non-tunneled triple lumen HD cath placed rather than TDC due to AFIB with RVR. Pt to return within next 7 days for conversion to [facility].  Discussed pt with RN. RN unsure how pt is eating or whether pt is drinking supplements.  No new weight since admission.  Meal Completion: 30-100%, only 2 meals recorded  Medications reviewed and include: Nepro TID, MVI with minerals daily, Protonix, IV Ancef  Labs reviewed: hemoglobin 9.1 (L)  UOP: 300 ml x 24 hours I/O's: -1.1 L since admit  Diet Order:   Diet Order            Diet regular Room service appropriate? Yes; Fluid consistency: Thin  Diet effective now              EDUCATION NEEDS:   Education needs have been addressed  Skin:  Skin Assessment: Reviewed RN Assessment  Last BM:   12/28  Height:   Ht Readings from Last 1 Encounters:  01/09/18 5' 10.5" (1.791 m)    Weight:   Wt Readings from Last 1 Encounters:  01/09/18 90.7 kg    Ideal Body Weight:  76.8 kg  BMI:  Body mass index is 28.29 kg/m.  Estimated Nutritional Needs:   Kcal:  2000-2200  Protein:  100-115 grams  Fluid:  >/= 2.0 L    Gaynell Face, MS, RD, LDN Inpatient Clinical Dietitian Pager: [phone] Weekend/After Hours: [phone]

## 2018-01-16 NOTE — Progress Notes (Signed)
Progress Note  Patient Name: Andrew Guerra Date of Encounter: 01/16/2018  Primary Cardiologist: Dr. Fransico Him, MD   Subjective   Pt feeling well today. Denies chest pain or SOB. Continues to have significant BLE swelling. Plan for HD cathter placement today for fluid volume management   Inpatient Medications    Scheduled Meds: . carvedilol  6.25 mg Oral BID WC  . Chlorhexidine Gluconate Cloth  6 each Topical Q0600  . feeding supplement (ENSURE ENLIVE)  237 mL Oral BID BM  . feeding supplement (NEPRO CARB STEADY)  237 mL Oral TID BM  . mouth rinse  15 mL Mouth Rinse BID  . mometasone-formoterol  2 puff Inhalation BID  . multivitamin with minerals  1 tablet Oral Daily  . Netarsudil-Latanoprost  1 drop Both Eyes QHS  . pantoprazole  40 mg Oral BID   Continuous Infusions: .  ceFAZolin (ANCEF) IV     PRN Meds: acetaminophen **OR** acetaminophen, ondansetron **OR** ondansetron (ZOFRAN) IV   Vital Signs    Vitals:   01/15/18 1954 01/15/18 2035 01/16/18 0551 01/16/18 0855  BP:  118/73 117/63 120/67  Pulse:  88 81 78  Resp:  18    Temp:  (!) 97.5 F (36.4 C) 97.9 F (36.6 C) (!) 97.4 F (36.3 C)  TempSrc:  Oral Oral Oral  SpO2: (!) 89% 92% 91%   Weight:      Height:        Intake/Output Summary (Last 24 hours) at 01/16/2018 0933 Last data filed at 01/16/2018 3893 Gross per 24 hour  Intake 152.49 ml  Output 300 ml  Net -147.51 ml   Filed Weights   01/09/18 1633  Weight: 90.7 kg    Physical Exam   General: Elderly, NAD Skin: Warm, dry, intact  Head: Normocephalic, atraumatic, clear, moist mucus membranes. Neck: Negative for carotid bruits. No JVD Lungs:Clear to ausculation bilaterally. No wheezes, rales, or rhonchi. Breathing is unlabored. Cardiovascular: RRR with S1 S2. + murmurs with no rubs, gallops, or LV heave appreciated. Abdomen: Soft, non-tender, non-distended with normoactive bowel sounds. No obvious abdominal masses. MSK: Strength and tone  appear normal for age. 5/5 in all extremities Extremities: BLE 3-4+ pitting edema to thighs and scrotum. DP/PT pulses 1+ bilaterally Neuro: Alert and oriented. No focal deficits. No facial asymmetry. MAE spontaneously. Psych: Responds to questions appropriately with normal affect.    Labs    Chemistry Recent Labs  Lab 01/09/18 1651  01/13/18 0354 01/14/18 1015 01/15/18 0410 01/16/18 0456  NA 138   < > 136 136 137 137  K 3.6   < > 3.8 3.5 3.5 3.6  CL 102   < > 100 102 100 99  CO2 21*   < > 23 24 26 26   GLUCOSE 106*   < > 112* 126* 109* 126*  BUN 76*   < > 89* 102* 107* 107*  CREATININE 2.72*   < > 2.96* 3.01* 3.03* 2.84*  CALCIUM 7.6*   < > 7.8* 8.1* 8.0* 8.0*  PROT 4.7*  --  4.7*  --   --  4.7*  ALBUMIN 1.6*  --  1.6* 1.7* 1.8* 1.9*  AST 56*  --  40  --   --  26  ALT 33  --  41  --   --  36  ALKPHOS 82  --  92  --   --  78  BILITOT 0.4  --  0.5  --   --  0.4  GFRNONAA 21*   < > 19* 19* 19* 20*  GFRAA 25*   < > 22* 22* 22* 23*  ANIONGAP 15   < > 13 10 11 12    < > = values in this interval not displayed.     Hematology Recent Labs  Lab 01/13/18 0354 01/14/18 1015 01/16/18 0456  WBC 23.8* 19.5* 20.1*  RBC 3.58* 3.61* 3.70*  HGB 8.4* 8.9* 9.1*  HCT 26.7* 27.3* 28.2*  MCV 74.6* 75.6* 76.2*  MCH 23.5* 24.7* 24.6*  MCHC 31.5 32.6 32.3  RDW 20.9* 21.0* 21.6*  PLT 340 338 335    Cardiac EnzymesNo results for input(s): TROPONINI in the last 168 hours. No results for input(s): TROPIPOC in the last 168 hours.   BNP Recent Labs  Lab 01/09/18 1205  BNP 598.0*     DDimer No results for input(s): DDIMER in the last 168 hours.   Radiology    Dg Chest 2 View  Result Date: 01/14/2018 CLINICAL DATA:  Shortness of breath EXAM: CHEST - 2 VIEW COMPARISON:  Chest radiograph 01/09/2018 FINDINGS: Monitoring leads overlie the patient. Stable cardiac and mediastinal contours. Similar-appearing patchy consolidation within the right greater than left lower lungs. Moderate right  and small left pleural effusions. No pneumothorax. IMPRESSION: Similar-appearing patchy consolidation within the right greater than left lower lungs with moderate right and small left pleural effusions. Electronically Signed   By: Lovey Newcomer M.D.   On: 01/14/2018 16:08    Telemetry    01/16/18 NSR- Personally Reviewed  ECG    No new tracing as of 01/16/18- Personally Reviewed  Cardiac Studies   Echocardiogram 01/13/2018: Impressions: EF 60 to 65% without regional wall motion normalities, - Compared to the exam from 10/24/17, the degree of right ventricular dilation has increased, and right ventricular systolic function has decreased. The degree of leftward septal shift and D shaped septum has worsened. There is now a trivial-small pericardial effusion. Side by side comparison of images performed  Patient Profile     77 y.o. male with a PMH of HTN, HLD, bicuspid aortic valve with moderate AS, NSCLC s/p resection and radiation, and PUD,who is being seen today for the evaluation of CHFat the request of Dr. Denton Brick.  Assessment & Plan    1.  Acute CHF exacerbation: -Echocardiogram 01/13/18 with LVEF is 65% with moderate to severe RV dilation and moderate RV systolic dysfunction -Presented with fluid volume overload in the setting of hypoalbuminemia with an albumin of 1.8 worsening renal function -Echocardiogram from 04/2017 with mild to moderate RV dysfunction and moderate AS -Plan is for right heart cath today, 01/16/2018 and hemodialysis for fluid volume management -IV lasix gtt d/c per nephrology in anticipation of HD catheter placement  -Continue NPO   2.  CKD/ESRD: -Plan for HD today, 01/16/2017 for fluid volume management -IR to place catheter  -Per IM, nephrology -Creatinine, 2.84 today, down from 3.03 today yesterday with a baseline of 0.7  3.  History of GI bleed: -Patient presented with an acute GI bleed with hemoglobin of 6.4 which is now improved to 9.1  today>>s/p 2 units PRBC -GI following/IM  Signed, [name] NP-C [place] Pager: [phone] 01/16/2018, 9:33 AM     For questions or updates, please contact   Please consult [email or web address] for contact info under Cardiology/STEMI.

## 2018-01-16 NOTE — Progress Notes (Signed)
Pt bed sheets found soaked when bathing pt. Pt foley catheter leaking. Assessed the foley bulb. Withdrew 8 ml,and refilled with 10 ml. Will continue to monitor, and notify nurse at hand off.

## 2018-01-16 NOTE — Progress Notes (Signed)
[facility] Progress Note  Name: DEMORIO Guerra MRN: [id number] DOB: 10/25/1940  Chief Complaint:  Swelling and GI bleed  Subjective:  Patient is seen and examined prior to being taken down for a tunneled catheter for HD.  We spoke about the plan for HD today and tomorrow with daily reassessment to guide management.  Reviewed cardiology note and their plans for right heart cath.  He has had 300 mL UOP over 12/29.  He has continued with high Oxygen requirement.  Review of systems:  Denies shortness of breath  No chest pain Denies n/v Has been NPO in anticipation of procedure  Foley has been in place    --------------------------- Background on Consult:  Andrew Guerra is a 77 y.o. male with a history of HTN and aortic stenosis who presented with GI bleed and hemoglobin 6.5 with PCP and report of black stool.  Found also with fluid overload/anasarca and CHF.  He has been evaluated by cardiology and has received lasix BID; only had 150 mL UOP charted on 12/26.  Work-up thus far notable for renal ultrasound without hydro and with trace abdominal ascites and prostatomegaly.  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  Spoke with patient as well a his wife and daughter.  He reports that his shortness of breath and swelling have worsened since around October of this year.  He reports that he had been on lasix daily for a couple of weeks prior to admission and had just been changed to torsemide when admitted.  He does have some urinary hesitancy and weak stream.  He has followed with urology previously for elevated PSA (which he reports to be around 7) but has not seen nephrology.  He denies NSAID use.  He would want dialysis if indicated.   Work-up has included ANA neg   Intake/Output Summary (Last 24 hours) at 01/16/2018 1056 Last data filed at 01/16/2018 8786 Gross per 24 hour  Intake 152.49 ml  Output 300 ml  Net -147.51 ml    Vitals:  Vitals:    01/15/18 1954 01/15/18 2035 01/16/18 0551 01/16/18 0855  BP:  118/73 117/63 120/67  Pulse:  88 81 78  Resp:  18    Temp:  (!) 97.5 F (36.4 C) 97.9 F (36.6 C) (!) 97.4 F (36.3 C)  TempSrc:  Oral Oral Oral  SpO2: (!) 89% 92% 91%   Weight:      Height:         Physical Exam:  General adult male in bed in no acute distress at rest  HEENT normocephalic atraumatic extraocular movements intact sclera anicteric Neck supple trachea midline Lungs clear to auscultation bilaterally normal work of breathing at rest; on supplemental oxygen per nasal cannula  Heart regular rate and rhythm no rubs or gallops appreciated Abdomen soft nontender nondistended Extremities 2-3+ edema bilateral lower extremities Psych normal mood and affect GU foley catheter Neuro alert and oriented x 3; conversant   Medications reviewed   Labs:  BMP Latest Ref Rng & Units 01/16/2018 01/15/2018 01/14/2018  Glucose 70 - 99 mg/dL 126(H) 109(H) 126(H)  BUN 8 - 23 mg/dL 107(H) 107(H) 102(H)  Creatinine 0.61 - 1.24 mg/dL 2.84(H) 3.03(H) 3.01(H)  BUN/Creat Ratio 10 - 24 - - -  Sodium 135 - 145 mmol/L 137 137 136  Potassium 3.5 - 5.1 mmol/L 3.6 3.5 3.5  Chloride 98 - 111 mmol/L 99 100 102  CO2 22 - 32 mmol/L 26 26 24  Calcium 8.9 - 10.3 mg/dL 8.0(L) 8.0(L) 8.1(L)    Assessment/Plan:  # AKI  - AKI is in part pre-renal with profound hypoalbuminemia/third spacing and intravascularly depleted.  Note UA with protein but no 0-5 RBC's.  ANA negative.  UP/cr ratio with 1490 mg/g - Worsening azotemia and continued peripheral volume overload not responsive to lasix   - HD today after ash split placement.  Plan for daily HD initially to optimize clearance and volume - Continue foley  - Stopped lasix gtt  - Not on aspirin (would remain off); would consider biopsy once resp status optimized  # CKD stage III  - Multifactorial with HTN, CHF on diuretics as well as potential insult of prostatomegaly - Baseline Cr noted  around 1.8  # Acute on chronic diastolic CHF  - diuretics on hold  - cardiology following - note plans for heart cath  - prior concern for hemachromatosis  - We will transition to managing volume with HD  # Proteinuria - subnephrotic  - SPEP pending; UPEP and serum free light chains ordered as misc send out tests.  UPEP not collected and note serum free light chains canceled (in error?) citing duplicate order.  Speaking with RN - Note profound hypoalbuminemia - out of proportion to proteinuria alone - HbA1c normal at 5.0  # Acute hypoxic respiratory failure  - optimize volume with HD once access obtained  # Acute GI bleed - s/p GI eval - s/p PRBC's  # Anemia - secondary to acute blood loss  - Stable/improved and s/p PRBC's   # Aortic stenosis - Noted - Cardiology following   # Prostatomegaly  - Started flomax (new this admission) for prostatomegaly with hesitancy then flomax paused with new confusion/marginal BP - Continue foley and reassess  Claudia Desanctis, MD 01/16/2018 10:56 AM

## 2018-01-16 NOTE — Progress Notes (Signed)
Patient Demographics:    Andrew Guerra, is a 77 y.o. male, DOB - November 19, 1940, YOV:[id number]  Admit date - 01/09/2018   Admitting Physician [name], DO  Outpatient Primary MD for the patient is [name], MD  LOS - 7   Chief Complaint  Patient presents with  . 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 The degree of leftward septal   shift and D shaped septum has worsened. There is now a   trivial-small pericardial effusion. Side by side comparison of   images performed  Brief Summary 77 y.o. male with a PMH of HTN, HLD, bicuspid aortic valve with moderate AS, NSCLC s/p resection and radiation, and PUD admitted from PCPs office on 01/09/2018 with hemoglobin of 6.4, patient with volume overload and anasarca, patient will be initiated on hemodialysis on 01/16/2018 to try to address his volume status  Plan:- 1)Acute Gi Bleed--- status post EGD on 01/10/2018 with nonbleeding gastric and duodenal ulcers, biopsies are pending, continue PPI twice  daily, monitor H&H and transfuse as clinically indicated, hemoglobin is stable at 8.9  posttransfusion of 2 units of packed cells this admission  2)HFpEF--acute on chronic diastolic dysfunction CHF exacerbation, last known EF 60 to 65% with moderate aortic stenosis... Patient with anasarca, evidence of left and right heart failure, cardiology consult appreciated,  patient has significant/massive volume overload in the setting of low albumin and worsening renal function, echo from 01/13/2018 with preserved EF of 60 to 65% however please see full echo report above, patient will probably need right heart cath if renal function improves or if he is on dialysis----after further discussions with nephrology and cardiology , c/n  Lasix drip at 10 mg/hour for now,  received metolazone x1 dose and albumin x1, patient will be initiated on hemodialysis on 01/16/2018 to try to address his volume status  3)AKI----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 Scheduled to see hematology outpatient but not until 03/2018--- be judicious with blood transfusion  7) Moderate aortic stenosis--- most likely contributing to #2 above, repeat echo EF 60 to 65%, degree of stenosis is not worse, patient  will need right heart cath after initiation on hemodialysis   8)FEN--poor oral intake, weight loss and low albumin----nutritional supplements advised, patient albumin has been less than 2, c/n need IV albumin with IV diuretics until patient is initiated on HD  9)NSCLC - post prior surgery and radiation, patient goes to [facility] every 6 months Looks like patient had this surgically resected, then had recurrence followed by SBRT with shrinking of the recurrent tumor according to oncology office visit notes.  10) transient episode of confusion/delirium/disorientation--- ???  Uremia related, now resolved,, continue to monitor closely.... May be hospital psychosis, at patient's wife request stopped Flomax  Disposition/Need for in-Hospital Stay- patient unable to be discharged at this time due to significant worsening of heart failure, Renal failure and volume overload requiring further diuresis and monitoring (see subjective section above), patient will need HD catheter and initiation on hemodialysis, patient will need outpatient hemodialysis chair/spot prior to discharge  Code Status : Full  Family Communication:   Wife at bedside  Disposition Plan  : To be determined  Consults  : Cardiology/GI/Nephrology/IR for HD catheter placement.... Consider hematology consult  DVT Prophylaxis  :  TEDS/ SCDs   Lab Results  Component Value Date   PLT 335 01/16/2018    Inpatient Medications  Scheduled Meds: . carvedilol  6.25 mg Oral BID WC  . Chlorhexidine Gluconate Cloth  6 each Topical Q0600  . [START ON 01/17/2018] Chlorhexidine Gluconate Cloth  6 each Topical Q0600  . feeding supplement (NEPRO CARB STEADY)  237 mL Oral TID BM  . heparin      . heparin      . lidocaine      . mouth rinse  15 mL Mouth Rinse BID  . mometasone-formoterol  2 puff Inhalation BID  . multivitamin with minerals  1 tablet Oral Daily  . Netarsudil-Latanoprost  1 drop Both Eyes QHS  . pantoprazole  40 mg Oral BID    Continuous Infusions: .  ceFAZolin (ANCEF) IV     PRN Meds:.acetaminophen **OR** acetaminophen, heparin, lidocaine, ondansetron **OR** ondansetron (ZOFRAN) IV   Anti-infectives (From admission, onward)   Start     Dose/Rate Route Frequency Ordered Stop   01/16/18 1230  ceFAZolin (ANCEF) IVPB 2g/100 mL premix    Note to Pharmacy:  Pre IR procedure, procedure time 12/30 not yet known   2 g 200 mL/hr over 30 Minutes Intravenous To Surgery 01/15/18 1112 01/17/18 1230        Objective:   Vitals:   01/16/18 1800 01/16/18 1830 01/16/18 1900 01/16/18 1907  BP: 96/64 90/68 123/72 (!) 143/92  Pulse: (!) 129 (!) 127 87 87  Resp: (!) 23 (!) 24 (!) 24 (!) 22  Temp:    97.7 F (36.5 C)  TempSrc:      SpO2: 96% 96% 96% 96%  Weight:      Height:        Wt Readings from Last 3 Encounters:  01/16/18 99.6  kg  01/09/18 99.7 kg  12/14/17 88.5 kg     Intake/Output Summary (Last 24 hours) at 01/16/2018 1942 Last data filed at 01/16/2018 1907 Gross per 24 hour  Intake 392.49 ml  Output 800 ml  Net -407.51 ml    Physical Exam Patient is examined daily including today on 01/16/18  , exams remain the same as of yesterday except that has changed   Gen:- Awake Alert, dyspnea on minimal exertion HEENT:- [place].AT, No sclera icterus Neck-Supple Neck, +ve JVD,.  Lungs-diminished in bases scattered rales in bases, no wheezing CV- S1, S2 normal, regular 3/6 SM Abd-  +ve B.Sounds, Abd Soft, No tenderness,    Extremity/Skin:-3+ edema/anasarca extending from the toes into the lower abdomen with  involvement of the penis and scrotum, has Thigh High TEDs, pedal pulses present  Psych-affect is appropriate, oriented x3 (had confusional episodes earlier in the morning) Neuro-no new focal deficits, no tremors GU--- patient with  scrotal and penile edema, has foley  Data Review:   Micro Results Recent Results (from the past 240 hour(s))  Urine Culture     Status: Abnormal   Collection Time:  01/13/18  8:25 AM  Result Value Ref Range Status   Specimen Description URINE, CLEAN CATCH  Final   Special Requests   Final    Normal Performed at [facility], [place] [address] [place], [place] [address]    Culture (A)  Final    >=100,000 COLONIES/mL MULTIPLE SPECIES PRESENT, SUGGEST RECOLLECTION   Report Status 01/14/2018 FINAL  Final  Culture, blood (Routine X 2) w Reflex to ID Panel     Status: None (Preliminary result)   Collection Time: 01/13/18  8:50 AM  Result Value Ref Range Status   Specimen Description BLOOD BLOOD LEFT HAND  Final   Special Requests   Final    BOTTLES DRAWN AEROBIC AND ANAEROBIC Blood Culture adequate volume   Culture   Final    NO GROWTH 3 DAYS Performed at [facility], [place] [address], [place],  [address]    Report Status PENDING  Incomplete    Radiology Reports Ct Abdomen Pelvis Wo Contrast  Result Date: 01/09/2018 CLINICAL DATA:  Low hemoglobin. EXAM: CT ABDOMEN AND PELVIS WITHOUT CONTRAST TECHNIQUE: Multidetector CT imaging of the abdomen and pelvis was performed following the standard protocol without IV contrast. COMPARISON:  CT 02/04/2015, abdomen MRI 12/09/2017 FINDINGS: Lower chest: Small bilateral pleural effusions. Cardiomegaly without pericardial effusion or thickening. Atherosclerosis of the included thoracic aorta without aneurysm. Coronary arteriosclerosis is seen. Pulmonary consolidation/atelectasis in the right middle lobe distribution. Hepatobiliary: Layering biliary sludge within the gallbladder without secondary signs of acute cholecystitis. The unenhanced liver demonstrates a granuloma in the right hepatic lobe. No definite mass or biliary dilatation. Pancreas: Unremarkable. No pancreatic ductal dilatation or surrounding inflammatory changes. Spleen: Normal size spleen with scattered calcifications noted within possibly representing vascular calcifications or granulomata. Adrenals/Urinary Tract: Scattered bilateral  hyperdense lesions of the kidneys are nonspecific but may represent proteinaceous or hemorrhagic cysts, the largest is right-sided and interpolar measuring up 1.6 cm with more ill-defined hyperdensity slightly exophytic off the right kidney. No nephrolithiasis or obstructive uropathy. The urinary bladder is unremarkable for the degree of distention. Stomach/Bowel: Stomach is within normal limits. The appendix is not confidently identified. No evidence of bowel wall thickening, distention, or inflammatory changes. Vascular/Lymphatic: Aortic atherosclerosis. No lymphadenopathy. Reproductive: Prostatomegaly measuring up to 5.7 cm. Other: Diffuse soft tissue anasarca. No ascites or free air. Musculoskeletal: Degenerative  disc disease L4-5 and L5-S1. IMPRESSION: 1. Pulmonary consolidation/atelectasis in the right middle lobe with small bilateral pleural effusions. 2. Scattered bilateral hyperdense lesions of the kidneys likely to represent proteinaceous or hemorrhagic cysts, the largest is in the right interpolar and interpolar measuring 1.6 cm with more ill-defined hyperdensity slightly exophytic off the right kidney. Findings better characterized on recent prior MRI. 3. Hepatic and splenic granulomata. 4. Diffuse soft tissue anasarca. 5. 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 Aortic Atherosclerosis (ICD10-I70.0). Electronically Signed   By: Ashley Royalty M.D.   On: 01/09/2018 20:33   Dg Chest 2 View  Result Date: 01/14/2018 CLINICAL DATA:  Shortness of breath EXAM: CHEST - 2 VIEW COMPARISON:  Chest radiograph 01/09/2018 FINDINGS: Monitoring leads overlie the patient. Stable cardiac and mediastinal contours. Similar-appearing patchy consolidation within the right greater than left lower lungs. Moderate right and small left pleural effusions. No pneumothorax. IMPRESSION: Similar-appearing patchy consolidation within the right greater than left lower lungs with moderate right and small left pleural effusions.  Electronically Signed   By: Lovey Newcomer M.D.   On: 01/14/2018 16:08   US Renal  Result Date: 01/13/2018 CLINICAL DATA:  Oliguria.  Elevated PSA. EXAM: RENAL / URINARY TRACT ULTRASOUND COMPLETE COMPARISON:  CT abdomen and pelvis 01/09/2018. FINDINGS: Right Kidney: Renal measurements: 10.5 x 4.6 x 5.0 cm = volume: 126.8 mL . Echogenicity within normal limits. A simple cyst measuring 1.9 cm in diameter is identified. No hydronephrosis visualized. Trace amount of perihepatic ascites is noted. Left Kidney: Renal measurements: 11.4 x 5.5 x 5.1 cm = volume: 163.2 mL. Echogenicity within normal limits. No mass or hydronephrosis visualized. Bladder: The bladder is almost completely decompressed. No focal abnormality. Prostatomegaly is seen. IMPRESSION: Negative for hydronephrosis or acute abnormality. Prostatomegaly. Trace amount of ascites. Electronically Signed   By: Inge Rise M.D.   On: 01/13/2018 11:29   Ir Fluoro Guide Cv Line Right  Result Date: 01/16/2018 INDICATION: 77 year old male with acute renal failure in need of hemodialysis. We had an initial plan for placement of a tunneled hemodialysis catheter, however he has leukocytosis today as well as new onset atrial fibrillation with uncontrolled rapid ventricular response. Therefore, we will place a non tunneled hemodialysis catheter to allow him to get hemodialysis quickly without the additional stress of moderate sedation. We can then convert the non tunneled catheter to a tunneled device once his other issues have been addressed. EXAM: IR RIGHT FLOURO GUIDE CV LINE; IR ULTRASOUND GUIDANCE VASC ACCESS RIGHT MEDICATIONS: None ANESTHESIA/SEDATION: None FLUOROSCOPY TIME:  Fluoroscopy Time: 0 minutes 12 seconds (1 mGy). COMPLICATIONS: None immediate. PROCEDURE: Informed written consent was obtained from the patient after a thorough discussion of the procedural risks, benefits and alternatives. All questions were addressed. Maximal Sterile Barrier  Technique was utilized including caps, mask, sterile gowns, sterile gloves, sterile drape, hand hygiene and skin antiseptic. A timeout was performed prior to the initiation of the procedure. The right internal jugular vein was interrogated with ultrasound and found to be widely patent. An image was obtained and stored for the medical record. Local anesthesia was attained by infiltration with 1% lidocaine. A small dermatotomy was made. Under real-time sonographic guidance, the vessel was punctured with an 18 gauge needle. A 0.035 wire was advanced into the inferior vena cava. The needle was removed. The skin tract was dilated and a 16 cm triple-lumen hemodialysis catheter was advanced over the wire and positioned with the catheter tip at the superior cavoatrial junction. The catheter was aspirated and  then flushed with heparinized saline. The catheter was then secured to the skin with 0 Prolene suture and a sterile bandage. The patient tolerated the procedure well. IMPRESSION: Successful placement of a non tunneled hemodialysis catheter via the right internal jugular vein. The tip of the catheter is at the superior cavoatrial junction and the catheter is ready for immediate use. Electronically Signed   By: Jacqulynn Cadet M.D.   On: 01/16/2018 14:05   Ir US Guide Vasc Access Right  Result Date: 01/16/2018 INDICATION: 77 year old male with acute renal failure in need of hemodialysis. We had an initial plan for placement of a tunneled hemodialysis catheter, however he has leukocytosis today as well as new onset atrial fibrillation with uncontrolled rapid ventricular response. Therefore, we will place a non tunneled hemodialysis catheter to allow him to get hemodialysis quickly without the additional stress of moderate sedation. We can then convert the non tunneled catheter to a tunneled device once his other issues have been addressed. EXAM: IR RIGHT FLOURO GUIDE CV LINE; IR ULTRASOUND GUIDANCE VASC ACCESS RIGHT  MEDICATIONS: None ANESTHESIA/SEDATION: None FLUOROSCOPY TIME:  Fluoroscopy Time: 0 minutes 12 seconds (1 mGy). COMPLICATIONS: None immediate. PROCEDURE: Informed written consent was obtained from the patient after a thorough discussion of the procedural risks, benefits and alternatives. All questions were addressed. Maximal Sterile Barrier Technique was utilized including caps, mask, sterile gowns, sterile gloves, sterile drape, hand hygiene and skin antiseptic. A timeout was performed prior to the initiation of the procedure. The right internal jugular vein was interrogated with ultrasound and found to be widely patent. An image was obtained and stored for the medical record. Local anesthesia was attained by infiltration with 1% lidocaine. A small dermatotomy was made. Under real-time sonographic guidance, the vessel was punctured with an 18 gauge needle. A 0.035 wire was advanced into the inferior vena cava. The needle was removed. The skin tract was dilated and a 16 cm triple-lumen hemodialysis catheter was advanced over the wire and positioned with the catheter tip at the superior cavoatrial junction. The catheter was aspirated and then flushed with heparinized saline. The catheter was then secured to the skin with 0 Prolene suture and a sterile bandage. The patient tolerated the procedure well. IMPRESSION: Successful placement of a non tunneled hemodialysis catheter via the right internal jugular vein. The tip of the catheter is at the superior cavoatrial junction and the catheter is ready for immediate use. Electronically Signed   By: Jacqulynn Cadet M.D.   On: 01/16/2018 14:05   Dg Chest Portable 1 View  Result Date: 01/09/2018 CLINICAL DATA:  Shortness of breath for 1 day. 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 EXAM: PORTABLE CHEST 1 VIEW COMPARISON:  Radiographs 11/09/2017. Abdominal MRI 12/09/2017. PET-CT 11/25/2015. FINDINGS: 1902 hours. There are right-greater-than-left pleural effusions  which have enlarged. There is associated bibasilar pulmonary opacity, likely atelectasis. There are stable postsurgical changes in the right hilar region. No pneumothorax or acute osseous findings are seen. IMPRESSION: Enlarging right-greater-than-left pleural effusions with associated bibasilar pulmonary opacities, likely atelectasis. No invert pulmonary edema. Electronically Signed   By: Richardean Sale M.D.   On: 01/09/2018 19:35     CBC Recent Labs  Lab 01/10/18 1058 01/12/18 0443 01/13/18 0354 01/14/18 1015 01/16/18 0456  WBC  --  26.3* 23.8* 19.5* 20.1*  HGB 7.8* 9.1* 8.4* 8.9* 9.1*  HCT 24.2* 29.1* 26.7* 27.3* 28.2*  PLT  --  360 340 338 335  MCV  --  75.6* 74.6* 75.6*  76.2*  MCH  --  23.6* 23.5* 24.7* 24.6*  MCHC  --  31.3 31.5 32.6 32.3  RDW  --  21.0* 20.9* 21.0* 21.6*    Chemistries  Recent Labs  Lab 01/12/18 0443 01/13/18 0354 01/14/18 1015 01/15/18 0410 01/16/18 0456  NA 138 136 136 137 137  K 3.4* 3.8 3.5 3.5 3.6  CL 101 100 102 100 99  CO2 23 23 24 26 26   GLUCOSE 115* 112* 126* 109* 126*  BUN 79* 89* 102* 107* 107*  CREATININE 2.78* 2.96* 3.01* 3.03* 2.84*  CALCIUM 7.7* 7.8* 8.1* 8.0* 8.0*  AST  --  40  --   --  26  ALT  --  41  --   --  36  ALKPHOS  --  92  --   --  78  BILITOT  --  0.5  --   --  0.4   ------------------------------------------------------------------------------------------------------------------ No results for input(s): CHOL, HDL, LDLCALC, TRIG, CHOLHDL, LDLDIRECT in the last 72 hours.  Lab Results  Component Value Date   HGBA1C 5.0 01/14/2018   ------------------------------------------------------------------------------------------------------------------  Coagulation profile No results for input(s): INR, PROTIME in the last 168 hours.  -----------------------------------------------------------------------------------------------------------------    Component Value Date/Time   BNP 598.0 (H) 01/09/2018 1205    Roxan Hockey M.D on 01/16/2018 at 7:42 PM  Pager---[phone] Go to [email or web address] - password [email or web address] for contact info  [facility] - Office  [phone]

## 2018-01-16 NOTE — Procedures (Signed)
Interventional Radiology Procedure Note  Procedure: Due to new onset AFIB with RVR, a temporary non-tunneled triple lumen HD catheter was placed.   Complications: None  Estimated Blood Loss: None  Recommendations: - Routine line care - Correct AFIB with RVR - Return to IR in the next 7 days for conversion to tunneled HD catheter  Signed,  Criselda Peaches, MD

## 2018-01-17 DIAGNOSIS — L8990 Pressure ulcer of unspecified site, unspecified stage: Secondary | ICD-10-CM

## 2018-01-17 LAB — HEPATITIS B SURFACE ANTIBODY,QUALITATIVE: Hep B S Ab: NONREACTIVE

## 2018-01-17 LAB — RENAL FUNCTION PANEL
Albumin: 1.7 g/dL — ABNORMAL LOW (ref 3.5–5.0)
Anion gap: 10 (ref 5–15)
BUN: 92 mg/dL — ABNORMAL HIGH (ref 8–23)
CO2: 26 mmol/L (ref 22–32)
Calcium: 8.1 mg/dL — ABNORMAL LOW (ref 8.9–10.3)
Chloride: 101 mmol/L (ref 98–111)
Creatinine, Ser: 2.58 mg/dL — ABNORMAL HIGH (ref 0.61–1.24)
GFR calc Af Amer: 26 mL/min — ABNORMAL LOW (ref >60)
GFR calc non Af Amer: 23 mL/min — ABNORMAL LOW (ref >60)
Glucose, Bld: 123 mg/dL — ABNORMAL HIGH (ref 70–99)
Phosphorus: 4.6 mg/dL (ref 2.5–4.6)
Potassium: 3.7 mmol/L (ref 3.5–5.1)
Sodium: 137 mmol/L (ref 135–145)

## 2018-01-17 LAB — KAPPA/LAMBDA LIGHT CHAINS
Kappa free light chain: 118.9 mg/L — ABNORMAL HIGH (ref 3.3–19.4)
Kappa, lambda light chain ratio: 1.12 (ref 0.26–1.65)
Lambda free light chains: 106 mg/L — ABNORMAL HIGH (ref 5.7–26.3)

## 2018-01-17 LAB — CBC
HCT: 27.1 % — ABNORMAL LOW (ref 39.0–52.0)
Hemoglobin: 8.3 g/dL — ABNORMAL LOW (ref 13.0–17.0)
MCH: 23.6 pg — ABNORMAL LOW (ref 26.0–34.0)
MCHC: 30.6 g/dL (ref 30.0–36.0)
MCV: 77.2 fL — ABNORMAL LOW (ref 80.0–100.0)
NRBC: 0.1 % (ref 0.0–0.2)
Platelets: 297 10*3/uL (ref 150–400)
RBC: 3.51 MIL/uL — ABNORMAL LOW (ref 4.22–5.81)
RDW: 22.2 % — AB (ref 11.5–15.5)
WBC: 18.5 10*3/uL — ABNORMAL HIGH (ref 4.0–10.5)

## 2018-01-17 LAB — PROTEIN ELECTROPHORESIS, SERUM
A/G Ratio: 0.7 (ref 0.7–1.7)
Albumin ELP: 1.9 g/dL — ABNORMAL LOW (ref 2.9–4.4)
Alpha-1-Globulin: 0.5 g/dL — ABNORMAL HIGH (ref 0.0–0.4)
Alpha-2-Globulin: 0.9 g/dL (ref 0.4–1.0)
Beta Globulin: 0.9 g/dL (ref 0.7–1.3)
Gamma Globulin: 0.4 g/dL (ref 0.4–1.8)
Globulin, Total: 2.7 g/dL (ref 2.2–3.9)
Total Protein ELP: 4.6 g/dL — ABNORMAL LOW (ref 6.0–8.5)

## 2018-01-17 LAB — MISC LABCORP TEST (SEND OUT)
Labcorp test code: 354928
SOURCE LABCORP: 121137

## 2018-01-17 LAB — HEPATITIS B CORE ANTIBODY, TOTAL: Hep B Core Total Ab: NEGATIVE

## 2018-01-17 LAB — GLUCOSE, CAPILLARY: Glucose-Capillary: 100 mg/dL — ABNORMAL HIGH (ref 70–99)

## 2018-01-17 LAB — HEPATITIS B SURFACE ANTIGEN: HEP B S AG: NEGATIVE

## 2018-01-17 MED ORDER — ALTEPLASE 2 MG IJ SOLR: 2.0000 mg | Freq: Once | INTRAMUSCULAR | Status: DC | PRN

## 2018-01-17 MED ORDER — SODIUM CHLORIDE 0.9 % IV SOLN: 250.0000 mL | INTRAVENOUS | Status: DC | PRN

## 2018-01-17 MED ORDER — CHLORHEXIDINE GLUCONATE CLOTH 2 % EX PADS
6.0000 | MEDICATED_PAD | Freq: Every day | CUTANEOUS | Status: DC
  Administered 2018-01-18 – 2018-01-20 (×3): 6 via TOPICAL

## 2018-01-17 MED ORDER — SODIUM CHLORIDE 0.9 % IV SOLN: INTRAVENOUS | Status: DC

## 2018-01-17 MED ORDER — SODIUM CHLORIDE 0.9% FLUSH: 3.0000 mL | INTRAVENOUS | Status: DC | PRN

## 2018-01-17 MED ORDER — HEPARIN SODIUM (PORCINE) 1000 UNIT/ML IJ SOLN
INTRAMUSCULAR | Status: AC
  Administered 2018-01-17: 1000 [IU]
  Filled 2018-01-17: qty 4

## 2018-01-17 MED ORDER — LIDOCAINE HCL (PF) 1 % IJ SOLN: 5.0000 mL | INTRAMUSCULAR | Status: DC | PRN

## 2018-01-17 MED ORDER — SODIUM CHLORIDE 0.9 % IV SOLN: 100.0000 mL | INTRAVENOUS | Status: DC | PRN

## 2018-01-17 MED ORDER — SODIUM CHLORIDE 0.9% FLUSH
3.0000 mL | Freq: Two times a day (BID) | INTRAVENOUS | Status: DC
  Administered 2018-01-17 – 2018-01-18 (×3): 3 mL via INTRAVENOUS

## 2018-01-17 MED ORDER — PENTAFLUOROPROP-TETRAFLUOROETH EX AERO: 1.0000 "application " | INHALATION_SPRAY | CUTANEOUS | Status: DC | PRN

## 2018-01-17 MED ORDER — HEPARIN SODIUM (PORCINE) 1000 UNIT/ML DIALYSIS: 1000.0000 [IU] | INTRAMUSCULAR | Status: DC | PRN

## 2018-01-17 MED ORDER — LIDOCAINE-PRILOCAINE 2.5-2.5 % EX CREA: 1.0000 "application " | TOPICAL_CREAM | CUTANEOUS | Status: DC | PRN

## 2018-01-17 NOTE — Procedures (Signed)
Seen and examined on dialysis and procedure supervised.  Blood pressure 141/75 and HR 74.  BF 250 via vascath.  Tolerating goal of 2 kg  Claudia Desanctis, MD 01/17/2018   8;46 am

## 2018-01-17 NOTE — Progress Notes (Signed)
Progress Note  Patient Name: Andrew Guerra Date of Encounter: 01/17/2018  Primary Cardiologist: Dr. Fransico Him, MD   Subjective   HD catheter placed yesterday and is currently undergoing HD for the second time. Plan is for RHC to assess RV pressures. Denies chest pain, mildly SOB  Inpatient Medications    Scheduled Meds: . carvedilol  6.25 mg Oral BID WC  . Chlorhexidine Gluconate Cloth  6 each Topical Q0600  . Chlorhexidine Gluconate Cloth  6 each Topical Q0600  . feeding supplement (NEPRO CARB STEADY)  237 mL Oral TID BM  . mouth rinse  15 mL Mouth Rinse BID  . mometasone-formoterol  2 puff Inhalation BID  . multivitamin with minerals  1 tablet Oral Daily  . Netarsudil-Latanoprost  1 drop Both Eyes QHS  . pantoprazole  40 mg Oral BID   Continuous Infusions: . sodium chloride    . sodium chloride    .  ceFAZolin (ANCEF) IV     PRN Meds: sodium chloride, sodium chloride, acetaminophen **OR** acetaminophen, alteplase, heparin, heparin, lidocaine (PF), lidocaine, lidocaine-prilocaine, ondansetron **OR** ondansetron (ZOFRAN) IV, pentafluoroprop-tetrafluoroeth   Vital Signs    Vitals:   01/17/18 0835 01/17/18 0845 01/17/18 0915 01/17/18 0945  BP: (!) 146/74 128/75 127/68 109/83  Pulse: 73 73 75 74  Resp: (!) 22 20 18 16   Temp: (!) 97.4 F (36.3 C)     TempSrc: Oral     SpO2: 97% 96% 98% 99%  Weight:      Height:        Intake/Output Summary (Last 24 hours) at 01/17/2018 1030 Last data filed at 01/17/2018 0300 Gross per 24 hour  Intake 240 ml  Output 600 ml  Net -360 ml   Filed Weights   01/09/18 1633 01/16/18 1700 01/17/18 0825  Weight: 90.7 kg 99.6 kg 98.4 kg   Physical Exam   General: Well developed, well nourished, NAD Skin: Warm, dry, intact  Head: Normocephalic, atraumatic, clear, moist mucus membranes. Neck: Negative for carotid bruits. No JVD Lungs:Clear to ausculation bilaterally. No wheezes, rales, or rhonchi. Breathing is  unlabored. Cardiovascular: RRR with S1 S2. + murmur. No rubs, gallops, or LV heave appreciated. MSK: Strength and tone appear normal for age. 5/5 in all extremities Extremities: 3-4+ BLE to thighs. No clubbing or cyanosis. DP/PT pulses 1+ bilaterally Neuro: Alert and oriented. No focal deficits. No facial asymmetry. MAE spontaneously. Psych: Responds to questions appropriately with normal affect.    Labs    Chemistry Recent Labs  Lab 01/13/18 0354  01/15/18 0410 01/16/18 0456 01/17/18 0410  NA 136   < > 137 137 137  K 3.8   < > 3.5 3.6 3.7  CL 100   < > 100 99 101  CO2 23   < > 26 26 26   GLUCOSE 112*   < > 109* 126* 123*  BUN 89*   < > 107* 107* 92*  CREATININE 2.96*   < > 3.03* 2.84* 2.58*  CALCIUM 7.8*   < > 8.0* 8.0* 8.1*  PROT 4.7*  --   --  4.7*  --   ALBUMIN 1.6*   < > 1.8* 1.9* 1.7*  AST 40  --   --  26  --   ALT 41  --   --  36  --   ALKPHOS 92  --   --  78  --   BILITOT 0.5  --   --  0.4  --   [facility]  19*   < > 19* 20* 23*  GFRAA 22*   < > 22* 23* 26*  ANIONGAP 13   < > 11 12 10    < > = values in this interval not displayed.     Hematology Recent Labs  Lab 01/14/18 1015 01/16/18 0456 01/17/18 0850  WBC 19.5* 20.1* 18.5*  RBC 3.61* 3.70* 3.51*  HGB 8.9* 9.1* 8.3*  HCT 27.3* 28.2* 27.1*  MCV 75.6* 76.2* 77.2*  MCH 24.7* 24.6* 23.6*  MCHC 32.6 32.3 30.6  RDW 21.0* 21.6* 22.2*  PLT 338 335 297    Cardiac EnzymesNo results for input(s): TROPONINI in the last 168 hours. No results for input(s): TROPIPOC in the last 168 hours.   BNPNo results for input(s): BNP, PROBNP in the last 168 hours.   DDimer No results for input(s): DDIMER in the last 168 hours.   Radiology    Ir Cyndy Freeze Guide Cv Line Right  Result Date: 01/16/2018 INDICATION: 77 year old male with acute renal failure in need of hemodialysis. We had an initial plan for placement of a tunneled hemodialysis catheter, however he has leukocytosis today as well as new onset atrial fibrillation with  uncontrolled rapid ventricular response. Therefore, we will place a non tunneled hemodialysis catheter to allow him to get hemodialysis quickly without the additional stress of moderate sedation. We can then convert the non tunneled catheter to a tunneled device once his other issues have been addressed. EXAM: IR RIGHT FLOURO GUIDE CV LINE; IR ULTRASOUND GUIDANCE VASC ACCESS RIGHT MEDICATIONS: None ANESTHESIA/SEDATION: None FLUOROSCOPY TIME:  Fluoroscopy Time: 0 minutes 12 seconds (1 mGy). COMPLICATIONS: None immediate. PROCEDURE: Informed written consent was obtained from the patient after a thorough discussion of the procedural risks, benefits and alternatives. All questions were addressed. Maximal Sterile Barrier Technique was utilized including caps, mask, sterile gowns, sterile gloves, sterile drape, hand hygiene and skin antiseptic. A timeout was performed prior to the initiation of the procedure. The right internal jugular vein was interrogated with ultrasound and found to be widely patent. An image was obtained and stored for the medical record. Local anesthesia was attained by infiltration with 1% lidocaine. A small dermatotomy was made. Under real-time sonographic guidance, the vessel was punctured with an 18 gauge needle. A 0.035 wire was advanced into the inferior vena cava. The needle was removed. The skin tract was dilated and a 16 cm triple-lumen hemodialysis catheter was advanced over the wire and positioned with the catheter tip at the superior cavoatrial junction. The catheter was aspirated and then flushed with heparinized saline. The catheter was then secured to the skin with 0 Prolene suture and a sterile bandage. The patient tolerated the procedure well. IMPRESSION: Successful placement of a non tunneled hemodialysis catheter via the right internal jugular vein. The tip of the catheter is at the superior cavoatrial junction and the catheter is ready for immediate use. Electronically Signed    By: Jacqulynn Cadet M.D.   On: 01/16/2018 14:05   Ir US Guide Vasc Access Right  Result Date: 01/16/2018 INDICATION: 77 year old male with acute renal failure in need of hemodialysis. We had an initial plan for placement of a tunneled hemodialysis catheter, however he has leukocytosis today as well as new onset atrial fibrillation with uncontrolled rapid ventricular response. Therefore, we will place a non tunneled hemodialysis catheter to allow him to get hemodialysis quickly without the additional stress of moderate sedation. We can then convert the non tunneled catheter to a tunneled device once his other issues  have been addressed. EXAM: IR RIGHT FLOURO GUIDE CV LINE; IR ULTRASOUND GUIDANCE VASC ACCESS RIGHT MEDICATIONS: None ANESTHESIA/SEDATION: None FLUOROSCOPY TIME:  Fluoroscopy Time: 0 minutes 12 seconds (1 mGy). COMPLICATIONS: None immediate. PROCEDURE: Informed written consent was obtained from the patient after a thorough discussion of the procedural risks, benefits and alternatives. All questions were addressed. Maximal Sterile Barrier Technique was utilized including caps, mask, sterile gowns, sterile gloves, sterile drape, hand hygiene and skin antiseptic. A timeout was performed prior to the initiation of the procedure. The right internal jugular vein was interrogated with ultrasound and found to be widely patent. An image was obtained and stored for the medical record. Local anesthesia was attained by infiltration with 1% lidocaine. A small dermatotomy was made. Under real-time sonographic guidance, the vessel was punctured with an 18 gauge needle. A 0.035 wire was advanced into the inferior vena cava. The needle was removed. The skin tract was dilated and a 16 cm triple-lumen hemodialysis catheter was advanced over the wire and positioned with the catheter tip at the superior cavoatrial junction. The catheter was aspirated and then flushed with heparinized saline. The catheter was then  secured to the skin with 0 Prolene suture and a sterile bandage. The patient tolerated the procedure well. IMPRESSION: Successful placement of a non tunneled hemodialysis catheter via the right internal jugular vein. The tip of the catheter is at the superior cavoatrial junction and the catheter is ready for immediate use. Electronically Signed   By: Jacqulynn Cadet M.D.   On: 01/16/2018 14:05    Telemetry    01/17/18 AF in the 70's  - Personally Reviewed  ECG    No new tracing as of 01/17/18- Personally Reviewed  Cardiac Studies   Echocardiogram 01/13/2018: Impressions: EF 60 to 65% without regional wall motion normalities, - Compared to the exam from 10/24/17, the degree of right ventricular dilation has increased, and right ventricular systolic function has decreased. The degree of leftward septal shift and D shaped septum has worsened. There is now a trivial-small pericardial effusion. Side by side comparison of images performed  Patient Profile     77 y.o. male with a PMH of HTN, HLD, bicuspid aortic valve with moderate AS, NSCLC s/p resection and radiation, and PUD,who is being seen today for the evaluation of CHFat the request of Dr. Denton Brick.  Assessment & Plan    1.  Acute CHF exacerbation: -Echocardiogram 01/13/18 with LVEF is 65% with moderate to severe RV dilation and moderate RV systolic dysfunction -Presented with fluid volume overload in the setting of hypoalbuminemia with an albumin of 1.8 worsening renal function -Echocardiogram from 04/2017 with mild to moderate RV dysfunction and moderate AS -Plan is for right heart cath on Thursday to further assess pulmonary pressures -HD today and tomorrow for fluid volume management  -Daily weight, 216lb today, down from 219lb yesterday  -I&O, net negative 1.5L  2.  CKD/ESRD: -HD catheter placed>>currently undergoing HD today for fluid volume management -Plan for possible renal biopsy once more stable -Per  IM, nephrology -Creatinine, 2.58 today, down from 2.84 yesterday with a baseline of 0.7  3.  History of GI bleed: -Patient presented with an acute GI bleed with hemoglobin of 6.4 which is now improved to 8.3 today>>s/p 2 units PRBC -GI following/IM   Signed, [name] NP-C [facility] Pager: [phone] 01/17/2018, 10:30 AM     For questions or updates, please contact   Please consult [email or web address] for contact info under Cardiology/STEMI.

## 2018-01-17 NOTE — Progress Notes (Signed)
[facility] Progress Note  Name: Andrew Guerra MRN: [id number] DOB: 1940/06/15  Chief Complaint:  Swelling and GI bleed  Subjective:  Patient had non-tunneled HD catheter and 1st HD treatment on 12/30 with 500 mL UF.  He Guerra that HD went ok.  (Had a fib with RVR which prevented placement of tunneled line).  Note also 100 mL UOP over 12/30.  Per nursing, patient desatted o/n and is now comfortable on 6LO2.  Spoke with HD unit and they will get him next.    Review of systems:   Reports shortness of breath  No chest pain Denies n/v   --------------------------- Background on Consult:  Andrew Guerra is a 77 y.o. male with a history of HTN and aortic stenosis who presented with GI bleed and hemoglobin 6.5 with PCP and report of black stool.  Found also with fluid overload/anasarca and CHF.  He has been evaluated by cardiology and has received lasix BID; only had 150 mL UOP charted on 12/26.  Work-up thus far notable for renal ultrasound without hydro and with trace abdominal ascites and prostatomegaly.  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  Spoke with patient as well a his wife and daughter.  He reports that his shortness of breath and swelling have worsened since around October of this year.  He reports that he had been on lasix daily for a couple of weeks prior to admission and had just been changed to torsemide when admitted.  He does have some urinary hesitancy and weak stream.  He has followed with urology previously for elevated PSA (which he reports to be around 7) but has not seen nephrology.  He denies NSAID use.  He would want dialysis if indicated.   Work-up has included ANA neg   Intake/Output Summary (Last 24 hours) at 01/17/2018 0705 Last data filed at 01/17/2018 0300 Gross per 24 hour  Intake 240 ml  Output 600 ml  Net -360 ml    Vitals:  Vitals:   01/16/18 1907 01/16/18 1945 01/16/18 2133 01/17/18 0548  BP: (!) 143/92  127/74 108/68 127/65  Pulse: 87 84 80 70  Resp: (!) 22 (!) 21 (!) 22 19  Temp: 97.7 F (36.5 C) 98.6 F (37 C) 98 F (36.7 C) (!) 97.5 F (36.4 C)  TempSrc:  Oral Oral Oral  SpO2: 96% 98% 97% 92%  Weight:      Height:         Physical Exam:  General adult male in bed in no acute distress at rest but short of breath with exertion  HEENT normocephalic atraumatic extraocular movements intact sclera anicteric Neck supple trachea midline Lungs clear to auscultation bilaterally normal work of breathing at rest; on 6L supplemental oxygen per nasal cannula  Heart regular rate and rhythm on my exam  Abdomen soft nontender nondistended Extremities 2-3+ edema bilateral lower extremities Psych normal mood and affect GU foley catheter Neuro alert and oriented x 3; conversant   Medications reviewed   Labs:  BMP Latest Ref Rng & Units 01/17/2018 01/16/2018 01/15/2018  Glucose 70 - 99 mg/dL 123(H) 126(H) 109(H)  BUN 8 - 23 mg/dL 92(H) 107(H) 107(H)  Creatinine 0.61 - 1.24 mg/dL 2.58(H) 2.84(H) 3.03(H)  BUN/Creat Ratio 10 - 24 - - -  Sodium 135 - 145 mmol/L 137 137 137  Potassium 3.5 - 5.1 mmol/L 3.7 3.6 3.5  Chloride 98 - 111 mmol/L 101 99 100  CO2 22 - 32 mmol/L  26 26 26   Calcium 8.9 - 10.3 mg/dL 8.1(L) 8.0(L) 8.0(L)    Assessment/Plan:  # AKI  - AKI is in part pre-renal with profound hypoalbuminemia/third spacing and intravascularly depleted.  Note UA with protein but no 0-5 RBC's.  ANA negative.  UP/cr ratio with 1490 mg/g - Initiated iHD on 12/30 for worsening azotemia and continued peripheral volume overload not responsive to lasix   - Access is non-tunneled triple lumen (IR) - HD today 12/31 and tomorrow 1/1 with reassessment daily  - Continue foley  - He is not on aspirin (would remain off); would consider renal biopsy once resp status optimized  # CKD stage III  - Multifactorial with HTN, CHF on diuretics as well as potential insult of chronic outlet obstruction with  prostatomegaly - Baseline Cr noted around 1.8  # Acute on chronic diastolic CHF  - diuretics on hold  - cardiology following - right heart cath per cardiology - prior concern for hemachromatosis  - We will transition to managing volume with HD - Daily weights are ordered but not being obtained  # Proteinuria - subnephrotic - SPEP and serum free light chains pending.  UPEP does not appear collected - spoke with RN - Note profound hypoalbuminemia - out of proportion to proteinuria alone - HbA1c normal at 5.0  # Acute hypoxic respiratory failure  - optimize volume with HD  - continue supplemental oxygen   # Acute GI bleed - s/p GI eval - s/p PRBC's  # Anemia - secondary to acute blood loss  - Stable/improved and s/p PRBC's   # Aortic stenosis - Noted - Cardiology following   # Prostatomegaly  - Started flomax (new this admission) for prostatomegaly with hesitancy then flomax paused with new confusion/marginal BP per family request - Continue foley for now and reassess  Claudia Desanctis, MD 01/17/2018 7:05 AM

## 2018-01-17 NOTE — Progress Notes (Signed)
Patient Demographics:    Andrew Guerra, is a 77 y.o. male, DOB - 1940/06/29, KYH:[id number]  Admit date - 01/09/2018   Admitting Physician [name], DO  Outpatient Primary MD for the patient is [name], MD  LOS - 8   Chief Complaint  Patient presents with  . 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 The degree of leftward septal   shift and D shaped septum has worsened. There is now a   trivial-small pericardial effusion. Side by side comparison of   images performed  Brief Summary 77 y.o. male with a PMH of HTN, HLD, bicuspid aortic valve with moderate AS, NSCLC s/p resection and radiation, and PUD admitted from PCPs office on 01/09/2018 with hemoglobin of 6.4, patient with volume overload and anasarca, patient will be initiated on hemodialysis on 01/16/2018 to try to address his volume status  Plan:- 1)Acute Gi Bleed--- status post EGD on 01/10/2018 with nonbleeding gastric and duodenal ulcers, biopsies are pending, continue PPI twice daily, monitor H&H and transfuse as clinically indicated, hemoglobin is stable at 8.3  posttransfusion of 2  units of packed cells this admission  2)HFpEF--acute on chronic diastolic dysfunction CHF exacerbation, last known EF 60 to 65% with moderate aortic stenosis... Patient with anasarca, evidence of left and right heart failure, cardiology consult appreciated,  patient has significant/massive volume overload in the setting of low albumin and worsening renal function, echo from 01/13/2018 with preserved EF of 60 to 65% however please see full echo report above, patient will probably need right heart cath if renal function improves or if he is on dialysis----after further discussions with nephrology and cardiology , c/n  Lasix drip at 10 mg/hour for now,  received metolazone x1 dose and albumin x1, patient was initiated on hemodialysis on 01/16/2018 to try to address his volume status,  for RHC on 01/19/2018  3)AKI----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 Scheduled to see hematology outpatient but not until 03/2018--- be judicious with blood transfusion  7) Moderate aortic stenosis--- most likely contributing to #2 above, repeat echo EF 60 to 65%, degree of stenosis is not worse, patient will need right heart cath after initiation on hemodialysis, for RHC on  01/19/2018   8)FEN--poor oral intake, weight loss and low albumin----nutritional supplements advised, patient albumin has been less than 2,   9)NSCLC - post prior surgery and radiation, patient goes to [facility] every 6 months Looks like patient had this surgically resected, then had recurrence followed by SBRT with shrinking of the recurrent tumor according to oncology office visit notes.  10) transient episode of confusion/delirium/disorientation--- ???  Uremia related, now resolved,, continue to monitor closely.... May be hospital psychosis,   Disposition/Need for in-Hospital Stay- patient unable to be discharged at this time due to significant worsening of heart failure, Renal failure and volume overload requiring further diuresis and monitoring (see subjective section above), patient will need HD catheter and initiation on hemodialysis, patient will need outpatient hemodialysis chair/spot prior to discharge  Code Status : Full  Family Communication:   Wife at bedside  Disposition Plan  : To be determined  Consults  : Cardiology/GI/Nephrology/IR for HD catheter placement.... Consider hematology consult   DVT Prophylaxis  :  TEDS/ SCDs   Lab Results  Component Value Date   PLT 297 01/17/2018   Inpatient Medications  Scheduled Meds: . carvedilol  6.25 mg Oral BID WC  . [START ON 01/18/2018] Chlorhexidine Gluconate Cloth  6 each Topical Q0600  . feeding supplement (NEPRO CARB STEADY)  237 mL Oral TID BM  . mouth rinse  15 mL Mouth Rinse BID  . mometasone-formoterol  2 puff Inhalation BID  . multivitamin with minerals  1 tablet Oral Daily  . Netarsudil-Latanoprost  1 drop Both Eyes QHS  . pantoprazole  40 mg Oral BID   Continuous Infusions:  PRN Meds:.acetaminophen **OR** acetaminophen, heparin, lidocaine, ondansetron **OR** ondansetron (ZOFRAN) IV   Anti-infectives (From admission, onward)   Start     Dose/Rate Route Frequency Ordered Stop   01/16/18 1230  ceFAZolin (ANCEF) IVPB  2g/100 mL premix    Note to Pharmacy:  Pre IR procedure, procedure time 12/30 not yet known   2 g 200 mL/hr over 30 Minutes Intravenous To Surgery 01/15/18 1112 01/17/18 1230        Objective:   Vitals:   01/17/18 1015 01/17/18 1045 01/17/18 1110 01/17/18 1220  BP: 139/75 131/76 134/74 132/71  Pulse: 76 73 79 78  Resp: 18 19 20 19   Temp:   98.2 F (36.8 C) 97.9 F (36.6 C)  TempSrc:   Oral Oral  SpO2: 99% 100% 100% 92%  Weight:   96.5 kg 98.9 kg  Height:        Wt Readings from Last 3 Encounters:  01/17/18 98.9 kg  01/09/18 99.7 kg  12/14/17 88.5 kg    Intake/Output Summary (Last 24 hours) at 01/17/2018 1843 Last data filed at 01/17/2018 1110 Gross per 24 hour  Intake 0 ml  Output 2600 ml  Net -2600 ml    Physical Exam Patient is examined daily including today on 01/17/18  , exams remain the same as of  yesterday except that has changed   Gen:- Awake Alert, able to speak in sentences HEENT:- [place].AT, No sclera icterus Neck-Supple Neck, +ve JVD,.  Right-sided HD catheter Lungs-improving air movement bilaterally, no wheezing  CV- S1, S2 normal, regular 3/6 SM Abd-  +ve B.Sounds, Abd Soft, No tenderness,    Extremity/Skin:- improving edema/anasarca extending from the toes into the lower abdomen with  involvement of the penis and scrotum, has Thigh High TEDs, pedal pulses present  Psych-affect is appropriate, oriented x3  Neuro-no new focal deficits, no tremors GU--- patient with improving  scrotal and penile edema, has foley  Data Review:   Micro Results Recent Results (from the past 240 hour(s))  Urine Culture     Status: Abnormal   Collection Time: 01/13/18  8:25 AM  Result Value Ref Range Status   Specimen Description URINE, CLEAN CATCH  Final   Special Requests   Final    Normal Performed at [facility], [place] [address] [place], [place] [address]    Culture (A)  Final    >=100,000 COLONIES/mL MULTIPLE SPECIES PRESENT, SUGGEST RECOLLECTION   Report  Status 01/14/2018 FINAL  Final  Culture, blood (Routine X 2) w Reflex to ID Panel     Status: None (Preliminary result)   Collection Time: 01/13/18  8:50 AM  Result Value Ref Range Status   Specimen Description BLOOD BLOOD LEFT HAND  Final   Special Requests   Final    BOTTLES DRAWN AEROBIC AND ANAEROBIC Blood Culture adequate volume   Culture   Final    NO GROWTH 4 DAYS Performed at [facility], [place] [address] [place], [place] [address]    Report Status PENDING  Incomplete    Radiology Reports Ct Abdomen Pelvis Wo Contrast  Result Date: 01/09/2018 CLINICAL DATA:  Low hemoglobin. EXAM: CT ABDOMEN AND PELVIS WITHOUT CONTRAST TECHNIQUE: Multidetector CT imaging of the abdomen and pelvis was performed following the standard protocol without IV contrast. COMPARISON:  CT 02/04/2015, abdomen MRI 12/09/2017 FINDINGS: Lower chest: Small bilateral pleural effusions. Cardiomegaly without pericardial effusion or thickening. Atherosclerosis of the included thoracic aorta without aneurysm. Coronary arteriosclerosis is seen. Pulmonary consolidation/atelectasis in the right middle lobe distribution. Hepatobiliary: Layering biliary sludge within the gallbladder without secondary signs of acute cholecystitis. The unenhanced liver demonstrates a granuloma in the right hepatic lobe. No definite mass or biliary dilatation. Pancreas: Unremarkable. No pancreatic ductal dilatation or surrounding inflammatory changes. Spleen: Normal size spleen with scattered calcifications noted within possibly representing vascular calcifications or granulomata. Adrenals/Urinary Tract: Scattered bilateral hyperdense lesions of the kidneys are nonspecific but may represent proteinaceous or hemorrhagic cysts, the largest is right-sided and interpolar measuring up 1.6 cm with more ill-defined hyperdensity slightly exophytic off the right kidney. No nephrolithiasis or obstructive uropathy. The urinary bladder is unremarkable for the  degree of distention. Stomach/Bowel: Stomach is within normal limits. The appendix is not confidently identified. No evidence of bowel wall thickening, distention, or inflammatory changes. Vascular/Lymphatic: Aortic atherosclerosis. No lymphadenopathy. Reproductive: Prostatomegaly measuring up to 5.7 cm. Other: Diffuse soft tissue anasarca. No ascites or free air. 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 IMPRESSION: 1. Pulmonary consolidation/atelectasis in the right middle lobe with small bilateral pleural effusions. 2. Scattered bilateral hyperdense lesions of the kidneys likely to represent proteinaceous or hemorrhagic cysts, the largest is in the right interpolar and interpolar measuring 1.6 cm with more ill-defined hyperdensity slightly exophytic off the right kidney. Findings better characterized on recent prior MRI. 3. Hepatic and  splenic granulomata. 4. Diffuse soft tissue anasarca. 5. 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 Aortic Atherosclerosis (ICD10-I70.0). Electronically Signed   By: Ashley Royalty M.D.   On: 01/09/2018 20:33   Dg Chest 2 View  Result Date: 01/14/2018 CLINICAL DATA:  Shortness of breath EXAM: CHEST - 2 VIEW COMPARISON:  Chest radiograph 01/09/2018 FINDINGS: Monitoring leads overlie the patient. Stable cardiac and mediastinal contours. Similar-appearing patchy consolidation within the right greater than left lower lungs. Moderate right and small left pleural effusions. No pneumothorax. IMPRESSION: Similar-appearing patchy consolidation within the right greater than left lower lungs with moderate right and small left pleural effusions. Electronically Signed   By: Lovey Newcomer M.D.   On: 01/14/2018 16:08   US Renal  Result Date: 01/13/2018 CLINICAL DATA:  Oliguria.  Elevated PSA. EXAM: RENAL / URINARY TRACT ULTRASOUND COMPLETE COMPARISON:  CT abdomen and pelvis 01/09/2018. FINDINGS: Right Kidney: Renal measurements: 10.5 x 4.6 x 5.0 cm = volume: 126.8 mL .  Echogenicity within normal limits. A simple cyst measuring 1.9 cm in diameter is identified. No hydronephrosis visualized. Trace amount of perihepatic ascites is noted. Left Kidney: Renal measurements: 11.4 x 5.5 x 5.1 cm = volume: 163.2 mL. Echogenicity within normal limits. No mass or hydronephrosis visualized. Bladder: The bladder is almost completely decompressed. No focal abnormality. Prostatomegaly is seen. IMPRESSION: Negative for hydronephrosis or acute abnormality. Prostatomegaly. Trace amount of ascites. Electronically Signed   By: Inge Rise M.D.   On: 01/13/2018 11:29   Ir Fluoro Guide Cv Line Right  Result Date: 01/16/2018 INDICATION: 77 year old male with acute renal failure in need of hemodialysis. We had an initial plan for placement of a tunneled hemodialysis catheter, however he has leukocytosis today as well as new onset atrial fibrillation with uncontrolled rapid ventricular response. Therefore, we will place a non tunneled hemodialysis catheter to allow him to get hemodialysis quickly without the additional stress of moderate sedation. We can then convert the non tunneled catheter to a tunneled device once his other issues have been addressed. EXAM: IR RIGHT FLOURO GUIDE CV LINE; IR ULTRASOUND GUIDANCE VASC ACCESS RIGHT MEDICATIONS: None ANESTHESIA/SEDATION: None FLUOROSCOPY TIME:  Fluoroscopy Time: 0 minutes 12 seconds (1 mGy). COMPLICATIONS: None immediate. PROCEDURE: Informed written consent was obtained from the patient after a thorough discussion of the procedural risks, benefits and alternatives. All questions were addressed. Maximal Sterile Barrier Technique was utilized including caps, mask, sterile gowns, sterile gloves, sterile drape, hand hygiene and skin antiseptic. A timeout was performed prior to the initiation of the procedure. The right internal jugular vein was interrogated with ultrasound and found to be widely patent. An image was obtained and stored for the medical  record. Local anesthesia was attained by infiltration with 1% lidocaine. A small dermatotomy was made. Under real-time sonographic guidance, the vessel was punctured with an 18 gauge needle. A 0.035 wire was advanced into the inferior vena cava. The needle was removed. The skin tract was dilated and a 16 cm triple-lumen hemodialysis catheter was advanced over the wire and positioned with the catheter tip at the superior cavoatrial junction. The catheter was aspirated and then flushed with heparinized saline. The catheter was then secured to the skin with 0 Prolene suture and a sterile bandage. The patient tolerated the procedure well. IMPRESSION: Successful placement of a non tunneled hemodialysis catheter via the right internal jugular vein. The tip of the catheter is at the superior cavoatrial junction and the catheter is ready for immediate use. Electronically Signed   By:  Jacqulynn Cadet M.D.   On: 01/16/2018 14:05   Ir US Guide Vasc Access Right  Result Date: 01/16/2018 INDICATION: 77 year old male with acute renal failure in need of hemodialysis. We had an initial plan for placement of a tunneled hemodialysis catheter, however he has leukocytosis today as well as new onset atrial fibrillation with uncontrolled rapid ventricular response. Therefore, we will place a non tunneled hemodialysis catheter to allow him to get hemodialysis quickly without the additional stress of moderate sedation. We can then convert the non tunneled catheter to a tunneled device once his other issues have been addressed. EXAM: IR RIGHT FLOURO GUIDE CV LINE; IR ULTRASOUND GUIDANCE VASC ACCESS RIGHT MEDICATIONS: None ANESTHESIA/SEDATION: None FLUOROSCOPY TIME:  Fluoroscopy Time: 0 minutes 12 seconds (1 mGy). COMPLICATIONS: None immediate. PROCEDURE: Informed written consent was obtained from the patient after a thorough discussion of the procedural risks, benefits and alternatives. All questions were addressed. Maximal Sterile  Barrier Technique was utilized including caps, mask, sterile gowns, sterile gloves, sterile drape, hand hygiene and skin antiseptic. A timeout was performed prior to the initiation of the procedure. The right internal jugular vein was interrogated with ultrasound and found to be widely patent. An image was obtained and stored for the medical record. Local anesthesia was attained by infiltration with 1% lidocaine. A small dermatotomy was made. Under real-time sonographic guidance, the vessel was punctured with an 18 gauge needle. A 0.035 wire was advanced into the inferior vena cava. The needle was removed. The skin tract was dilated and a 16 cm triple-lumen hemodialysis catheter was advanced over the wire and positioned with the catheter tip at the superior cavoatrial junction. The catheter was aspirated and then flushed with heparinized saline. The catheter was then secured to the skin with 0 Prolene suture and a sterile bandage. The patient tolerated the procedure well. IMPRESSION: Successful placement of a non tunneled hemodialysis catheter via the right internal jugular vein. The tip of the catheter is at the superior cavoatrial junction and the catheter is ready for immediate use. Electronically Signed   By: Jacqulynn Cadet M.D.   On: 01/16/2018 14:05   Dg Chest Portable 1 View  Result Date: 01/09/2018 CLINICAL DATA:  Shortness of breath for 1 day. 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 EXAM: PORTABLE CHEST 1 VIEW COMPARISON:  Radiographs 11/09/2017. Abdominal MRI 12/09/2017. PET-CT 11/25/2015. FINDINGS: 1902 hours. There are right-greater-than-left pleural effusions which have enlarged. There is associated bibasilar pulmonary opacity, likely atelectasis. There are stable postsurgical changes in the right hilar region. No pneumothorax or acute osseous findings are seen. IMPRESSION: Enlarging right-greater-than-left pleural effusions with associated bibasilar pulmonary opacities, likely  atelectasis. No invert pulmonary edema. Electronically Signed   By: Richardean Sale M.D.   On: 01/09/2018 19:35     CBC Recent Labs  Lab 01/12/18 0443 01/13/18 0354 01/14/18 1015 01/16/18 0456 01/17/18 0850  WBC 26.3* 23.8* 19.5* 20.1* 18.5*  HGB 9.1* 8.4* 8.9* 9.1* 8.3*  HCT 29.1* 26.7* 27.3* 28.2* 27.1*  PLT 360 340 338 335 297  MCV 75.6* 74.6* 75.6* 76.2* 77.2*  MCH 23.6* 23.5* 24.7* 24.6* 23.6*  MCHC 31.3 31.5 32.6 32.3 30.6  RDW 21.0* 20.9* 21.0* 21.6* 22.2*    Chemistries  Recent Labs  Lab 01/13/18 0354 01/14/18 1015 01/15/18 0410 01/16/18 0456 01/17/18 0410  NA 136 136 137 137 137  K 3.8 3.5 3.5 3.6 3.7  CL 100 102 100 99 101  CO2 23 24 26 26 26   GLUCOSE 112*  126* 109* 126* 123*  BUN 89* 102* 107* 107* 92*  CREATININE 2.96* 3.01* 3.03* 2.84* 2.58*  CALCIUM 7.8* 8.1* 8.0* 8.0* 8.1*  AST 40  --   --  26  --   ALT 41  --   --  36  --   ALKPHOS 92  --   --  78  --   BILITOT 0.5  --   --  0.4  --     Lab Results  Component Value Date   HGBA1C 5.0 01/14/2018   -----------------------------------------------------------------------------------------------------------------    Component Value Date/Time   BNP 598.0 (H) 01/09/2018 1205    Roxan Hockey M.D on 01/17/2018 at 6:43 PM  Pager---[phone] Go to [email or web address] - password [email or web address] for contact info  [facility] - Office  [phone]

## 2018-01-17 NOTE — H&P (View-Only) (Signed)
Progress Note  Patient Name: Andrew Guerra Date of Encounter: 01/17/2018  Primary Cardiologist: Dr. Fransico Him, MD   Subjective   HD catheter placed yesterday and is currently undergoing HD for the second time. Plan is for RHC to assess RV pressures. Denies chest pain, mildly SOB  Inpatient Medications    Scheduled Meds: . carvedilol  6.25 mg Oral BID WC  . Chlorhexidine Gluconate Cloth  6 each Topical Q0600  . Chlorhexidine Gluconate Cloth  6 each Topical Q0600  . feeding supplement (NEPRO CARB STEADY)  237 mL Oral TID BM  . mouth rinse  15 mL Mouth Rinse BID  . mometasone-formoterol  2 puff Inhalation BID  . multivitamin with minerals  1 tablet Oral Daily  . Netarsudil-Latanoprost  1 drop Both Eyes QHS  . pantoprazole  40 mg Oral BID   Continuous Infusions: . sodium chloride    . sodium chloride    .  ceFAZolin (ANCEF) IV     PRN Meds: sodium chloride, sodium chloride, acetaminophen **OR** acetaminophen, alteplase, heparin, heparin, lidocaine (PF), lidocaine, lidocaine-prilocaine, ondansetron **OR** ondansetron (ZOFRAN) IV, pentafluoroprop-tetrafluoroeth   Vital Signs    Vitals:   01/17/18 0835 01/17/18 0845 01/17/18 0915 01/17/18 0945  BP: (!) 146/74 128/75 127/68 109/83  Pulse: 73 73 75 74  Resp: (!) 22 20 18 16   Temp: (!) 97.4 F (36.3 C)     TempSrc: Oral     SpO2: 97% 96% 98% 99%  Weight:      Height:        Intake/Output Summary (Last 24 hours) at 01/17/2018 1030 Last data filed at 01/17/2018 0300 Gross per 24 hour  Intake 240 ml  Output 600 ml  Net -360 ml   Filed Weights   01/09/18 1633 01/16/18 1700 01/17/18 0825  Weight: 90.7 kg 99.6 kg 98.4 kg   Physical Exam   General: Well developed, well nourished, NAD Skin: Warm, dry, intact  Head: Normocephalic, atraumatic, clear, moist mucus membranes. Neck: Negative for carotid bruits. No JVD Lungs:Clear to ausculation bilaterally. No wheezes, rales, or rhonchi. Breathing is  unlabored. Cardiovascular: RRR with S1 S2. + murmur. No rubs, gallops, or LV heave appreciated. MSK: Strength and tone appear normal for age. 5/5 in all extremities Extremities: 3-4+ BLE to thighs. No clubbing or cyanosis. DP/PT pulses 1+ bilaterally Neuro: Alert and oriented. No focal deficits. No facial asymmetry. MAE spontaneously. Psych: Responds to questions appropriately with normal affect.    Labs    Chemistry Recent Labs  Lab 01/13/18 0354  01/15/18 0410 01/16/18 0456 01/17/18 0410  NA 136   < > 137 137 137  K 3.8   < > 3.5 3.6 3.7  CL 100   < > 100 99 101  CO2 23   < > 26 26 26   GLUCOSE 112*   < > 109* 126* 123*  BUN 89*   < > 107* 107* 92*  CREATININE 2.96*   < > 3.03* 2.84* 2.58*  CALCIUM 7.8*   < > 8.0* 8.0* 8.1*  PROT 4.7*  --   --  4.7*  --   ALBUMIN 1.6*   < > 1.8* 1.9* 1.7*  AST 40  --   --  26  --   ALT 41  --   --  36  --   ALKPHOS 92  --   --  78  --   BILITOT 0.5  --   --  0.4  --   [facility]  19*   < > 19* 20* 23*  GFRAA 22*   < > 22* 23* 26*  ANIONGAP 13   < > 11 12 10    < > = values in this interval not displayed.     Hematology Recent Labs  Lab 01/14/18 1015 01/16/18 0456 01/17/18 0850  WBC 19.5* 20.1* 18.5*  RBC 3.61* 3.70* 3.51*  HGB 8.9* 9.1* 8.3*  HCT 27.3* 28.2* 27.1*  MCV 75.6* 76.2* 77.2*  MCH 24.7* 24.6* 23.6*  MCHC 32.6 32.3 30.6  RDW 21.0* 21.6* 22.2*  PLT 338 335 297    Cardiac EnzymesNo results for input(s): TROPONINI in the last 168 hours. No results for input(s): TROPIPOC in the last 168 hours.   BNPNo results for input(s): BNP, PROBNP in the last 168 hours.   DDimer No results for input(s): DDIMER in the last 168 hours.   Radiology    Ir Cyndy Freeze Guide Cv Line Right  Result Date: 01/16/2018 INDICATION: 77 year old male with acute renal failure in need of hemodialysis. We had an initial plan for placement of a tunneled hemodialysis catheter, however he has leukocytosis today as well as new onset atrial fibrillation with  uncontrolled rapid ventricular response. Therefore, we will place a non tunneled hemodialysis catheter to allow him to get hemodialysis quickly without the additional stress of moderate sedation. We can then convert the non tunneled catheter to a tunneled device once his other issues have been addressed. EXAM: IR RIGHT FLOURO GUIDE CV LINE; IR ULTRASOUND GUIDANCE VASC ACCESS RIGHT MEDICATIONS: None ANESTHESIA/SEDATION: None FLUOROSCOPY TIME:  Fluoroscopy Time: 0 minutes 12 seconds (1 mGy). COMPLICATIONS: None immediate. PROCEDURE: Informed written consent was obtained from the patient after a thorough discussion of the procedural risks, benefits and alternatives. All questions were addressed. Maximal Sterile Barrier Technique was utilized including caps, mask, sterile gowns, sterile gloves, sterile drape, hand hygiene and skin antiseptic. A timeout was performed prior to the initiation of the procedure. The right internal jugular vein was interrogated with ultrasound and found to be widely patent. An image was obtained and stored for the medical record. Local anesthesia was attained by infiltration with 1% lidocaine. A small dermatotomy was made. Under real-time sonographic guidance, the vessel was punctured with an 18 gauge needle. A 0.035 wire was advanced into the inferior vena cava. The needle was removed. The skin tract was dilated and a 16 cm triple-lumen hemodialysis catheter was advanced over the wire and positioned with the catheter tip at the superior cavoatrial junction. The catheter was aspirated and then flushed with heparinized saline. The catheter was then secured to the skin with 0 Prolene suture and a sterile bandage. The patient tolerated the procedure well. IMPRESSION: Successful placement of a non tunneled hemodialysis catheter via the right internal jugular vein. The tip of the catheter is at the superior cavoatrial junction and the catheter is ready for immediate use. Electronically Signed    By: Jacqulynn Cadet M.D.   On: 01/16/2018 14:05   Ir US Guide Vasc Access Right  Result Date: 01/16/2018 INDICATION: 77 year old male with acute renal failure in need of hemodialysis. We had an initial plan for placement of a tunneled hemodialysis catheter, however he has leukocytosis today as well as new onset atrial fibrillation with uncontrolled rapid ventricular response. Therefore, we will place a non tunneled hemodialysis catheter to allow him to get hemodialysis quickly without the additional stress of moderate sedation. We can then convert the non tunneled catheter to a tunneled device once his other issues  have been addressed. EXAM: IR RIGHT FLOURO GUIDE CV LINE; IR ULTRASOUND GUIDANCE VASC ACCESS RIGHT MEDICATIONS: None ANESTHESIA/SEDATION: None FLUOROSCOPY TIME:  Fluoroscopy Time: 0 minutes 12 seconds (1 mGy). COMPLICATIONS: None immediate. PROCEDURE: Informed written consent was obtained from the patient after a thorough discussion of the procedural risks, benefits and alternatives. All questions were addressed. Maximal Sterile Barrier Technique was utilized including caps, mask, sterile gowns, sterile gloves, sterile drape, hand hygiene and skin antiseptic. A timeout was performed prior to the initiation of the procedure. The right internal jugular vein was interrogated with ultrasound and found to be widely patent. An image was obtained and stored for the medical record. Local anesthesia was attained by infiltration with 1% lidocaine. A small dermatotomy was made. Under real-time sonographic guidance, the vessel was punctured with an 18 gauge needle. A 0.035 wire was advanced into the inferior vena cava. The needle was removed. The skin tract was dilated and a 16 cm triple-lumen hemodialysis catheter was advanced over the wire and positioned with the catheter tip at the superior cavoatrial junction. The catheter was aspirated and then flushed with heparinized saline. The catheter was then  secured to the skin with 0 Prolene suture and a sterile bandage. The patient tolerated the procedure well. IMPRESSION: Successful placement of a non tunneled hemodialysis catheter via the right internal jugular vein. The tip of the catheter is at the superior cavoatrial junction and the catheter is ready for immediate use. Electronically Signed   By: Jacqulynn Cadet M.D.   On: 01/16/2018 14:05    Telemetry    01/17/18 AF in the 70's  - Personally Reviewed  ECG    No new tracing as of 01/17/18- Personally Reviewed  Cardiac Studies   Echocardiogram 01/13/2018: Impressions: EF 60 to 65% without regional wall motion normalities, - Compared to the exam from 10/24/17, the degree of right ventricular dilation has increased, and right ventricular systolic function has decreased. The degree of leftward septal shift and D shaped septum has worsened. There is now a trivial-small pericardial effusion. Side by side comparison of images performed  Patient Profile     77 y.o. male with a PMH of HTN, HLD, bicuspid aortic valve with moderate AS, NSCLC s/p resection and radiation, and PUD,who is being seen today for the evaluation of CHFat the request of Dr. Denton Brick.  Assessment & Plan    1.  Acute CHF exacerbation: -Echocardiogram 01/13/18 with LVEF is 65% with moderate to severe RV dilation and moderate RV systolic dysfunction -Presented with fluid volume overload in the setting of hypoalbuminemia with an albumin of 1.8 worsening renal function -Echocardiogram from 04/2017 with mild to moderate RV dysfunction and moderate AS -Plan is for right heart cath on Thursday to further assess pulmonary pressures -HD today and tomorrow for fluid volume management  -Daily weight, 216lb today, down from 219lb yesterday  -I&O, net negative 1.5L  2.  CKD/ESRD: -HD catheter placed>>currently undergoing HD today for fluid volume management -Plan for possible renal biopsy once more stable -Per  IM, nephrology -Creatinine, 2.58 today, down from 2.84 yesterday with a baseline of 0.7  3.  History of GI bleed: -Patient presented with an acute GI bleed with hemoglobin of 6.4 which is now improved to 8.3 today>>s/p 2 units PRBC -GI following/IM   Signed, [name] NP-C [facility] Pager: [phone] 01/17/2018, 10:30 AM     For questions or updates, please contact   Please consult [email or web address] for contact info under Cardiology/STEMI.

## 2018-01-18 LAB — RENAL FUNCTION PANEL
Albumin: 1.7 g/dL — ABNORMAL LOW (ref 3.5–5.0)
Anion gap: 6 (ref 5–15)
BUN: 68 mg/dL — ABNORMAL HIGH (ref 8–23)
CALCIUM: 7.9 mg/dL — AB (ref 8.9–10.3)
CO2: 30 mmol/L (ref 22–32)
Chloride: 102 mmol/L (ref 98–111)
Creatinine, Ser: 2.42 mg/dL — ABNORMAL HIGH (ref 0.61–1.24)
GFR calc non Af Amer: 24 mL/min — ABNORMAL LOW (ref >60)
GFR, EST AFRICAN AMERICAN: 28 mL/min — AB (ref >60)
Glucose, Bld: 130 mg/dL — ABNORMAL HIGH (ref 70–99)
Phosphorus: 2.9 mg/dL (ref 2.5–4.6)
Potassium: 3.6 mmol/L (ref 3.5–5.1)
Sodium: 138 mmol/L (ref 135–145)

## 2018-01-18 LAB — CULTURE, BLOOD (ROUTINE X 2)
Culture: NO GROWTH
Special Requests: ADEQUATE

## 2018-01-18 MED ORDER — CARVEDILOL 3.125 MG PO TABS
3.1250 mg | ORAL_TABLET | Freq: Two times a day (BID) | ORAL | Status: DC
  Administered 2018-01-18 – 2018-01-30 (×18): 3.125 mg via ORAL
  Filled 2018-01-18 (×20): qty 1

## 2018-01-18 MED ORDER — TRAZODONE HCL 50 MG PO TABS
100.0000 mg | ORAL_TABLET | Freq: Every day | ORAL | Status: DC
  Administered 2018-01-18: 100 mg via ORAL
  Filled 2018-01-18: qty 2

## 2018-01-18 MED ORDER — HEPARIN SODIUM (PORCINE) 1000 UNIT/ML IJ SOLN
INTRAMUSCULAR | Status: AC
  Administered 2018-01-18: 3200 [IU] via INTRAVENOUS
  Filled 2018-01-18: qty 3

## 2018-01-18 NOTE — Progress Notes (Signed)
[facility] Progress Note  Name: Andrew Guerra MRN: [id number] DOB: Jul 04, 1940   Subjective:  Tolerated HD yesterday 2L and 2L today.  RHC planned for tomorrow. He seems overwhelmed by hospitalization.  No focal complaints.   Review of systems:   Reports shortness of breath  No chest pain Denies n/v   --------------------------- Background on Consult:  Andrew Guerra is a 78 y.o. male with a history of HTN and aortic stenosis who presented with GI bleed and hemoglobin 6.5 with PCP and report of black stool.  Found also with fluid overload/anasarca and CHF.  He has been evaluated by cardiology and has received lasix BID; only had 150 mL UOP charted on 12/26.  Work-up thus far notable for renal ultrasound without hydro and with trace abdominal ascites and prostatomegaly.  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  Spoke with patient as well a his wife and daughter.  He reports that his shortness of breath and swelling have worsened since around October of this year.  He reports that he had been on lasix daily for a couple of weeks prior to admission and had just been changed to torsemide when admitted.  He does have some urinary hesitancy and weak stream.  He has followed with urology previously for elevated PSA (which he reports to be around 7) but has not seen nephrology.  He denies NSAID use.  He would want dialysis if indicated.   Work-up has included ANA neg   Intake/Output Summary (Last 24 hours) at 01/18/2018 1544 Last data filed at 01/18/2018 1300 Gross per 24 hour  Intake 540 ml  Output 2001 ml  Net -1461 ml    Vitals:  Vitals:   01/18/18 0930 01/18/18 1000 01/18/18 1010 01/18/18 1311  BP: 117/69 120/69 (!) 144/60 98/69  Pulse: 95 82 88 (!) 47  Resp: (!) 22 (!) 21 20 20   Temp:   97.9 F (36.6 C) 98.9 F (37.2 C)  TempSrc:   Oral Oral  SpO2: 98% 98% 98% 95%  Weight:   92.7 kg   Height:         Physical Exam:  General  adult male in bed in no acute distress at rest HEENT normocephalic atraumatic extraocular movements intact sclera anicteric Neck supple trachea midline Lungs clear to auscultation bilaterally normal work of breathing at rest  Heart regular rate and rhythm on my exam  Abdomen soft nontender nondistended Extremities 2-3+ edema bilateral lower extremities Psych normal mood and affect GU foley catheter minimal UOP Neuro alert and oriented x 3; conversant   Medications reviewed   Labs:  BMP Latest Ref Rng & Units 01/18/2018 01/17/2018 01/16/2018  Glucose 70 - 99 mg/dL 130(H) 123(H) 126(H)  BUN 8 - 23 mg/dL 68(H) 92(H) 107(H)  Creatinine 0.61 - 1.24 mg/dL 2.42(H) 2.58(H) 2.84(H)  BUN/Creat Ratio 10 - 24 - - -  Sodium 135 - 145 mmol/L 138 137 137  Potassium 3.5 - 5.1 mmol/L 3.6 3.7 3.6  Chloride 98 - 111 mmol/L 102 101 99  CO2 22 - 32 mmol/L 30 26 26   Calcium 8.9 - 10.3 mg/dL 7.9(L) 8.1(L) 8.0(L)    Assessment/Plan:  # AKI  - AKI is in part pre-renal with profound hypoalbuminemia/third spacing and intravascularly depleted.  Note UA with protein but no 0-5 RBC's.  ANA negative.  UP/cr ratio with 1490 mg/g - Initiated iHD on 12/30 for worsening azotemia and continued peripheral volume overload not responsive to lasix   -  Access is non-tunneled triple lumen (IR) - HD today 12/31 and tomorrow 1/1 with reassessment daily  - Continue foley  - He is not on aspirin (would remain off); would consider renal biopsy once resp status optimized  # CKD stage III  - Multifactorial with HTN, CHF on diuretics as well as potential insult of chronic outlet obstruction with prostatomegaly - Baseline Cr noted around 1.8  # Acute on chronic diastolic CHF  - diuretics on hold  - cardiology following - right heart cath per cardiology tomorrow - prior concern for hemachromatosis  - We will transition to managing volume with HD - Daily weights are ordered but not being obtained  # Proteinuria -  subnephrotic - SPEP and serum free light chains pending.  UPEP does not appear collected - MD spoke with RN yesterday, still pending - Note profound hypoalbuminemia - out of proportion to proteinuria alone - HbA1c normal at 5.0  # Acute hypoxic respiratory failure  - improving with volume removal with HD  - continue supplemental oxygen   # Acute GI bleed - s/p GI eval - s/p PRBC's  # Anemia - secondary to acute blood loss  - Stable/improved and s/p PRBC's   # Aortic stenosis - Noted - Cardiology following   # Prostatomegaly  - Started flomax (new this admission) for prostatomegaly with hesitancy then flomax paused with new confusion/marginal BP per family request - Continue foley for now and reassess  Justin Mend, MD 01/18/2018 3:44 PM

## 2018-01-18 NOTE — Progress Notes (Signed)
12 lead EKG done by RN because HR 40s after HD. 12 lead states "STEMI"  Patient with known Right BBB and right heart failure, plan cath tomorrow.  Patient denies CP.  Dr Denton Brick at bedside to assess patient.  Cardiology reviewed 12 lead EKG no plans for emergent cath.  RN to call if patient develops chest pain.

## 2018-01-18 NOTE — Progress Notes (Signed)
Informed Dr. Denton Brick that patient came back from Hemodialysis with HR in the 40's. MD ordered Stat EKG.

## 2018-01-18 NOTE — Progress Notes (Signed)
Patient Demographics:    Andrew Guerra, is a 78 y.o. male, DOB - [birth year], FUX:[id number]  Admit date - 01/09/2018   Admitting Physician [name], DO  Outpatient Primary MD for the patient is [name], MD  LOS - 9   Chief Complaint  Patient presents with  . 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 The degree of leftward septal   shift and D shaped septum has worsened. There is now a   trivial-small pericardial effusion. Side by side comparison of   images performed  Brief Summary 78 y.o. male with a PMH of HTN, HLD, bicuspid aortic valve with moderate AS, NSCLC s/p resection and radiation, and PUD admitted from PCPs office on 01/09/2018 with hemoglobin of 6.4, patient with volume overload and anasarca, patient will be initiated on hemodialysis on 01/16/2018 to try to address his volume status, for RHC on 01/19/2018  Plan:- 1)Acute Gi Bleed--- status post EGD on 01/10/2018 with nonbleeding gastric and duodenal ulcers, biopsies are pending,  continue PPI twice daily, monitor H&H and transfuse as clinically indicated, hemoglobin is stable at 8.3  Post-transfusion of 2 units of packed cells this admission  2)HFpEF--acute on chronic diastolic dysfunction CHF exacerbation, last known EF 60 to 65% with moderate aortic stenosis... 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 Scheduled to see hematology outpatient but not until 03/2018--- be judicious with blood transfusion  7) Moderate aortic stenosis--- most likely contributing to #2 above, repeat echo EF 60 to 65%, degree of stenosis is not worse, patient will need right heart cath after initiation on hemodialysis, for RHC on 01/19/2018   8)FEN--poor oral intake, weight loss  and low albumin----nutritional supplements advised, patient albumin has been less than 2,   9)NSCLC - post prior surgery and radiation, patient goes to [facility] every 6 months Looks like patient had this surgically resected, then had recurrence followed by SBRT with shrinking of the recurrent tumor according to oncology office visit notes.  10) transient episode of confusion/delirium/disorientation--- ???  Uremia related, now resolved,, continue to monitor closely.... May be hospital psychosis,   11) episodes of bradycardia post hemodialysis----EKG reviewed with Dr. Marlou Porch from cardiology on 01/18/2018, no acute concerns, patient remains chest pain-free, okay to decrease Coreg to 3.125 mg twice daily  Disposition/Need for in-Hospital Stay- patient unable to be discharged at this time due to significant worsening of heart failure, Renal failure and volume overload requiring further diuresis and monitoring (see subjective section above), patient will need HD catheter and initiation on hemodialysis, patient will need outpatient hemodialysis chair/spot prior to discharge---- for [place] on 01/19/2018  Code Status : Full  Family Communication:   Wife at bedside  Disposition Plan  : To be determined  Consults  : Cardiology/GI/Nephrology/IR for HD catheter placement.... Consider hematology consult   DVT Prophylaxis  :  TEDS/ SCDs   Lab Results  Component Value Date   PLT 297 01/17/2018   Inpatient Medications  Scheduled Meds: . carvedilol  3.125 mg Oral BID WC  . Chlorhexidine Gluconate Cloth  6 each Topical Q0600  . feeding supplement (NEPRO CARB STEADY)  237 mL Oral TID BM  . mouth rinse  15 mL Mouth Rinse BID  . mometasone-formoterol  2 puff Inhalation BID  . multivitamin with minerals  1 tablet Oral Daily  . Netarsudil-Latanoprost  1 drop Both Eyes QHS  . pantoprazole  40 mg Oral BID  . sodium chloride flush  3 mL Intravenous Q12H   Continuous Infusions: . sodium chloride    . sodium  chloride     PRN Meds:.sodium chloride, acetaminophen **OR** acetaminophen, heparin, lidocaine, ondansetron **OR** ondansetron (ZOFRAN) IV, sodium chloride flush   Anti-infectives (From admission, onward)   Start     Dose/Rate Route Frequency Ordered Stop   01/16/18 1230  ceFAZolin (ANCEF) IVPB 2g/100 mL premix    Note to Pharmacy:  Pre IR procedure, procedure time 12/30 not yet known   2 g 200 mL/hr over 30 Minutes Intravenous To Surgery 01/15/18 1112 01/17/18 1230        Objective:   Vitals:   01/18/18 0930 01/18/18 1000 01/18/18 1010 01/18/18 1311  BP: 117/69 120/69 (!) 144/60 98/69  Pulse: 95 82 88 (!) 47  Resp: (!) 22 (!) 21 20 20   Temp:   97.9 F (36.6 C) 98.9 F (37.2 C)  TempSrc:   Oral Oral  SpO2: 98% 98% 98% 95%  Weight:   92.7 kg   Height:        Wt Readings from Last 3 Encounters:  01/18/18 92.7 kg  01/09/18 99.7 kg  12/14/17 88.5 kg    Intake/Output  Summary (Last 24 hours) at 01/18/2018 1637 Last data filed at 01/18/2018 1300 Gross per 24 hour  Intake 540 ml  Output 2001 ml  Net -1461 ml    Physical Exam Patient is examined daily including today on 01/18/2018  , exams remain the same as of yesterday except that has changed   Gen:- Awake Alert, able to speak in sentences HEENT:- [place].AT, No sclera icterus Neck-Supple Neck, +ve JVD,.  Right-sided HD catheter Lungs-improving air movement bilaterally, no wheezing  CV- S1, S2 normal, regular 3/6 SM Abd-  +ve B.Sounds, Abd Soft, No tenderness,    Extremity/Skin:- improving edema/anasarca extending from the toes into the lower abdomen with  involvement of the penis and scrotum, has TEDs, pedal pulses present  Psych-affect is appropriate, oriented x3  Neuro-no new focal deficits, no tremors GU--- patient with improving  scrotal and penile edema, has foley  Data Review:   Micro Results Recent Results (from the past 240 hour(s))  Urine Culture     Status: Abnormal   Collection Time: 01/13/18  8:25 AM    Result Value Ref Range Status   Specimen Description URINE, CLEAN CATCH  Final   Special Requests   Final    Normal Performed at [facility], [place] [address] [place], [place] [address]    Culture (A)  Final    >=100,000 COLONIES/mL MULTIPLE SPECIES PRESENT, SUGGEST RECOLLECTION   Report Status 01/14/2018 FINAL  Final  Culture, blood (Routine X 2) w Reflex to ID Panel     Status: None   Collection Time: 01/13/18  8:50 AM  Result Value Ref Range Status   Specimen Description BLOOD BLOOD LEFT HAND  Final   Special Requests   Final    BOTTLES DRAWN AEROBIC AND ANAEROBIC Blood Culture adequate volume   Culture   Final    NO GROWTH 5 DAYS Performed at [facility], [place] [address] [place], [place] [address]    Report Status 01/18/2018 FINAL  Final    Radiology Reports Ct Abdomen Pelvis Wo Contrast  Result Date: 01/09/2018 CLINICAL DATA:  Low hemoglobin. EXAM: CT ABDOMEN AND PELVIS WITHOUT CONTRAST TECHNIQUE: Multidetector CT imaging of the abdomen and pelvis was performed following the standard protocol without IV contrast. COMPARISON:  CT 02/04/2015, abdomen MRI 12/09/2017 FINDINGS: Lower chest: Small bilateral pleural effusions. Cardiomegaly without pericardial effusion or thickening. Atherosclerosis of the included thoracic aorta without aneurysm. Coronary arteriosclerosis is seen. Pulmonary consolidation/atelectasis in the right middle lobe distribution. Hepatobiliary: Layering biliary sludge within the gallbladder without secondary signs of acute cholecystitis. The unenhanced liver demonstrates a granuloma in the right hepatic lobe. No definite mass or biliary dilatation. Pancreas: Unremarkable. No pancreatic ductal dilatation or surrounding inflammatory changes. Spleen: Normal size spleen with scattered calcifications noted within possibly representing vascular calcifications or granulomata. Adrenals/Urinary Tract: Scattered bilateral hyperdense lesions of the kidneys are  nonspecific but may represent proteinaceous or hemorrhagic cysts, the largest is right-sided and interpolar measuring up 1.6 cm with more ill-defined hyperdensity slightly exophytic off the right kidney. No nephrolithiasis or obstructive uropathy. The urinary bladder is unremarkable for the degree of distention. Stomach/Bowel: Stomach is within normal limits. The appendix is not confidently identified. No evidence of bowel wall thickening, distention, or inflammatory changes. Vascular/Lymphatic: Aortic atherosclerosis. No lymphadenopathy. Reproductive: Prostatomegaly measuring up to 5.7 cm. Other: Diffuse soft tissue anasarca. No ascites or free air. 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 IMPRESSION: 1. Pulmonary consolidation/atelectasis in the right middle lobe with small bilateral pleural  effusions. 2. Scattered bilateral hyperdense lesions of the kidneys likely to represent proteinaceous or hemorrhagic cysts, the largest is in the right interpolar and interpolar measuring 1.6 cm with more ill-defined hyperdensity slightly exophytic off the right kidney. Findings better characterized on recent prior MRI. 3. Hepatic and splenic granulomata. 4. Diffuse soft tissue anasarca. 5. 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 Aortic Atherosclerosis (ICD10-I70.0). Electronically Signed   By: Ashley Royalty M.D.   On: 01/09/2018 20:33   Dg Chest 2 View  Result Date: 01/14/2018 CLINICAL DATA:  Shortness of breath EXAM: CHEST - 2 VIEW COMPARISON:  Chest radiograph 01/09/2018 FINDINGS: Monitoring leads overlie the patient. Stable cardiac and mediastinal contours. Similar-appearing patchy consolidation within the right greater than left lower lungs. Moderate right and small left pleural effusions. No pneumothorax. IMPRESSION: Similar-appearing patchy consolidation within the right greater than left lower lungs with moderate right and small left pleural effusions. Electronically Signed   By: Lovey Newcomer M.D.    On: 01/14/2018 16:08   US Renal  Result Date: 01/13/2018 CLINICAL DATA:  Oliguria.  Elevated PSA. EXAM: RENAL / URINARY TRACT ULTRASOUND COMPLETE COMPARISON:  CT abdomen and pelvis 01/09/2018. FINDINGS: Right Kidney: Renal measurements: 10.5 x 4.6 x 5.0 cm = volume: 126.8 mL . Echogenicity within normal limits. A simple cyst measuring 1.9 cm in diameter is identified. No hydronephrosis visualized. Trace amount of perihepatic ascites is noted. Left Kidney: Renal measurements: 11.4 x 5.5 x 5.1 cm = volume: 163.2 mL. Echogenicity within normal limits. No mass or hydronephrosis visualized. Bladder: The bladder is almost completely decompressed. No focal abnormality. Prostatomegaly is seen. IMPRESSION: Negative for hydronephrosis or acute abnormality. Prostatomegaly. Trace amount of ascites. Electronically Signed   By: Inge Rise M.D.   On: 01/13/2018 11:29   Ir Fluoro Guide Cv Line Right  Result Date: 01/16/2018 INDICATION: 78 year old male with acute renal failure in need of hemodialysis. We had an initial plan for placement of a tunneled hemodialysis catheter, however he has leukocytosis today as well as new onset atrial fibrillation with uncontrolled rapid ventricular response. Therefore, we will place a non tunneled hemodialysis catheter to allow him to get hemodialysis quickly without the additional stress of moderate sedation. We can then convert the non tunneled catheter to a tunneled device once his other issues have been addressed. EXAM: IR RIGHT FLOURO GUIDE CV LINE; IR ULTRASOUND GUIDANCE VASC ACCESS RIGHT MEDICATIONS: None ANESTHESIA/SEDATION: None FLUOROSCOPY TIME:  Fluoroscopy Time: 0 minutes 12 seconds (1 mGy). COMPLICATIONS: None immediate. PROCEDURE: Informed written consent was obtained from the patient after a thorough discussion of the procedural risks, benefits and alternatives. All questions were addressed. Maximal Sterile Barrier Technique was utilized including caps, mask,  sterile gowns, sterile gloves, sterile drape, hand hygiene and skin antiseptic. A timeout was performed prior to the initiation of the procedure. The right internal jugular vein was interrogated with ultrasound and found to be widely patent. An image was obtained and stored for the medical record. Local anesthesia was attained by infiltration with 1% lidocaine. A small dermatotomy was made. Under real-time sonographic guidance, the vessel was punctured with an 18 gauge needle. A 0.035 wire was advanced into the inferior vena cava. The needle was removed. The skin tract was dilated and a 16 cm triple-lumen hemodialysis catheter was advanced over the wire and positioned with the catheter tip at the superior cavoatrial junction. The catheter was aspirated and then flushed with heparinized saline. The catheter was then secured to the skin with 0 Prolene suture and  a sterile bandage. The patient tolerated the procedure well. IMPRESSION: Successful placement of a non tunneled hemodialysis catheter via the right internal jugular vein. The tip of the catheter is at the superior cavoatrial junction and the catheter is ready for immediate use. Electronically Signed   By: Jacqulynn Cadet M.D.   On: 01/16/2018 14:05   Ir US Guide Vasc Access Right  Result Date: 01/16/2018 INDICATION: 78 year old male with acute renal failure in need of hemodialysis. We had an initial plan for placement of a tunneled hemodialysis catheter, however he has leukocytosis today as well as new onset atrial fibrillation with uncontrolled rapid ventricular response. Therefore, we will place a non tunneled hemodialysis catheter to allow him to get hemodialysis quickly without the additional stress of moderate sedation. We can then convert the non tunneled catheter to a tunneled device once his other issues have been addressed. EXAM: IR RIGHT FLOURO GUIDE CV LINE; IR ULTRASOUND GUIDANCE VASC ACCESS RIGHT MEDICATIONS: None ANESTHESIA/SEDATION: None  FLUOROSCOPY TIME:  Fluoroscopy Time: 0 minutes 12 seconds (1 mGy). COMPLICATIONS: None immediate. PROCEDURE: Informed written consent was obtained from the patient after a thorough discussion of the procedural risks, benefits and alternatives. All questions were addressed. Maximal Sterile Barrier Technique was utilized including caps, mask, sterile gowns, sterile gloves, sterile drape, hand hygiene and skin antiseptic. A timeout was performed prior to the initiation of the procedure. The right internal jugular vein was interrogated with ultrasound and found to be widely patent. An image was obtained and stored for the medical record. Local anesthesia was attained by infiltration with 1% lidocaine. A small dermatotomy was made. Under real-time sonographic guidance, the vessel was punctured with an 18 gauge needle. A 0.035 wire was advanced into the inferior vena cava. The needle was removed. The skin tract was dilated and a 16 cm triple-lumen hemodialysis catheter was advanced over the wire and positioned with the catheter tip at the superior cavoatrial junction. The catheter was aspirated and then flushed with heparinized saline. The catheter was then secured to the skin with 0 Prolene suture and a sterile bandage. The patient tolerated the procedure well. IMPRESSION: Successful placement of a non tunneled hemodialysis catheter via the right internal jugular vein. The tip of the catheter is at the superior cavoatrial junction and the catheter is ready for immediate use. Electronically Signed   By: Jacqulynn Cadet M.D.   On: 01/16/2018 14:05   Dg Chest Portable 1 View  Result Date: 01/09/2018 CLINICAL DATA:  Shortness of breath for 1 day. 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 EXAM: PORTABLE CHEST 1 VIEW COMPARISON:  Radiographs 11/09/2017. Abdominal MRI 12/09/2017. PET-CT 11/25/2015. FINDINGS: 1902 hours. There are right-greater-than-left pleural effusions which have enlarged. There is associated  bibasilar pulmonary opacity, likely atelectasis. There are stable postsurgical changes in the right hilar region. No pneumothorax or acute osseous findings are seen. IMPRESSION: Enlarging right-greater-than-left pleural effusions with associated bibasilar pulmonary opacities, likely atelectasis. No invert pulmonary edema. Electronically Signed   By: Richardean Sale M.D.   On: 01/09/2018 19:35     CBC Recent Labs  Lab 01/12/18 0443 01/13/18 0354 01/14/18 1015 01/16/18 0456 01/17/18 0850  WBC 26.3* 23.8* 19.5* 20.1* 18.5*  HGB 9.1* 8.4* 8.9* 9.1* 8.3*  HCT 29.1* 26.7* 27.3* 28.2* 27.1*  PLT 360 340 338 335 297  MCV 75.6* 74.6* 75.6* 76.2* 77.2*  MCH 23.6* 23.5* 24.7* 24.6* 23.6*  MCHC 31.3 31.5 32.6 32.3 30.6  RDW 21.0* 20.9* 21.0* 21.6* 22.2*  Chemistries  Recent Labs  Lab 01/13/18 0354 01/14/18 1015 01/15/18 0410 01/16/18 0456 01/17/18 0410 01/18/18 0353  NA 136 136 137 137 137 138  K 3.8 3.5 3.5 3.6 3.7 3.6  CL 100 102 100 99 101 102  CO2 23 24 26 26 26 30   GLUCOSE 112* 126* 109* 126* 123* 130*  BUN 89* 102* 107* 107* 92* 68*  CREATININE 2.96* 3.01* 3.03* 2.84* 2.58* 2.42*  CALCIUM 7.8* 8.1* 8.0* 8.0* 8.1* 7.9*  AST 40  --   --  26  --   --   ALT 41  --   --  36  --   --   ALKPHOS 92  --   --  78  --   --   BILITOT 0.5  --   --  0.4  --   --     Lab Results  Component Value Date   HGBA1C 5.0 01/14/2018   -----------------------------------------------------------------------------------------------------------------    Component Value Date/Time   BNP 598.0 (H) 01/09/2018 1205    Hiren Peplinski M.D on 01/18/2018 at 4:37 PM  Pager---[phone] Go to [email or web address] - password [email or web address] for contact info  [facility] - Office  [phone]

## 2018-01-19 ENCOUNTER — Encounter (HOSPITAL_COMMUNITY)
Admission: EM | Disposition: A | Discharge status: Home Health | Payer: Self-pay | Source: Home / Self Care | Attending: Family Medicine

## 2018-01-19 ENCOUNTER — Encounter (HOSPITAL_COMMUNITY): Payer: Self-pay | Admitting: Internal Medicine

## 2018-01-19 HISTORY — PX: RIGHT HEART CATH: CATH118263

## 2018-01-19 LAB — CBC
HCT: 28.9 % — ABNORMAL LOW (ref 39.0–52.0)
Hemoglobin: 9.1 g/dL — ABNORMAL LOW (ref 13.0–17.0)
MCH: 23.7 pg — ABNORMAL LOW (ref 26.0–34.0)
MCHC: 31.5 g/dL (ref 30.0–36.0)
MCV: 75.3 fL — ABNORMAL LOW (ref 80.0–100.0)
Platelets: 338 10*3/uL (ref 150–400)
RBC: 3.84 MIL/uL — ABNORMAL LOW (ref 4.22–5.81)
RDW: 21.7 % — ABNORMAL HIGH (ref 11.5–15.5)
WBC: 19.9 10*3/uL — AB (ref 4.0–10.5)
nRBC: 0.1 % (ref 0.0–0.2)

## 2018-01-19 LAB — POCT I-STAT 3, VENOUS BLOOD GAS (G3P V)
Acid-Base Excess: 2 mmol/L (ref 0.0–2.0)
Acid-Base Excess: 2 mmol/L (ref 0.0–2.0)
Bicarbonate: 27.5 mmol/L (ref 20.0–28.0)
Bicarbonate: 27.7 mmol/L (ref 20.0–28.0)
O2 Saturation: 53 %
O2 Saturation: 56 %
TCO2: 29 mmol/L (ref 22–32)
TCO2: 29 mmol/L (ref 22–32)
pCO2, Ven: 44.5 mmHg (ref 44.0–60.0)
pCO2, Ven: 44.8 mmHg (ref 44.0–60.0)
pH, Ven: 7.398 (ref 7.250–7.430)
pH, Ven: 7.399 (ref 7.250–7.430)
pO2, Ven: 28 mmHg — CL (ref 32.0–45.0)
pO2, Ven: 30 mmHg — CL (ref 32.0–45.0)

## 2018-01-19 LAB — RENAL FUNCTION PANEL
Albumin: 1.7 g/dL — ABNORMAL LOW (ref 3.5–5.0)
Anion gap: 10 (ref 5–15)
BUN: 53 mg/dL — ABNORMAL HIGH (ref 8–23)
CHLORIDE: 103 mmol/L (ref 98–111)
CO2: 27 mmol/L (ref 22–32)
Calcium: 7.8 mg/dL — ABNORMAL LOW (ref 8.9–10.3)
Creatinine, Ser: 2.51 mg/dL — ABNORMAL HIGH (ref 0.61–1.24)
GFR calc non Af Amer: 23 mL/min — ABNORMAL LOW (ref >60)
GFR, EST AFRICAN AMERICAN: 27 mL/min — AB (ref >60)
Glucose, Bld: 121 mg/dL — ABNORMAL HIGH (ref 70–99)
Phosphorus: 2.6 mg/dL (ref 2.5–4.6)
Potassium: 3.5 mmol/L (ref 3.5–5.1)
Sodium: 140 mmol/L (ref 135–145)

## 2018-01-19 SURGERY — RIGHT HEART CATH

## 2018-01-19 MED ORDER — FENTANYL CITRATE (PF) 100 MCG/2ML IJ SOLN
INTRAMUSCULAR | Status: AC
  Filled 2018-01-19: qty 2

## 2018-01-19 MED ORDER — HEPARIN (PORCINE) IN NACL 1000-0.9 UT/500ML-% IV SOLN
INTRAVENOUS | Status: AC
  Filled 2018-01-19: qty 1000

## 2018-01-19 MED ORDER — MIDAZOLAM HCL 2 MG/2ML IJ SOLN
INTRAMUSCULAR | Status: DC | PRN
  Administered 2018-01-19: 0.5 mg via INTRAVENOUS

## 2018-01-19 MED ORDER — SODIUM CHLORIDE 0.9% FLUSH
3.0000 mL | INTRAVENOUS | Status: DC | PRN
  Administered 2018-01-28: 3 mL via INTRAVENOUS
  Filled 2018-01-19 (×2): qty 3

## 2018-01-19 MED ORDER — LIDOCAINE HCL (PF) 1 % IJ SOLN
INTRAMUSCULAR | Status: AC
  Filled 2018-01-19: qty 30

## 2018-01-19 MED ORDER — MIDAZOLAM HCL 2 MG/2ML IJ SOLN
INTRAMUSCULAR | Status: AC
  Filled 2018-01-19: qty 2

## 2018-01-19 MED ORDER — HEPARIN (PORCINE) IN NACL 1000-0.9 UT/500ML-% IV SOLN
INTRAVENOUS | Status: DC | PRN
  Administered 2018-01-19: 500 mL

## 2018-01-19 MED ORDER — LIDOCAINE HCL (PF) 1 % IJ SOLN
INTRAMUSCULAR | Status: DC | PRN
  Administered 2018-01-19: 16 mL via INTRADERMAL

## 2018-01-19 MED ORDER — ZOLPIDEM TARTRATE 5 MG PO TABS
5.0000 mg | ORAL_TABLET | Freq: Every evening | ORAL | Status: DC | PRN
  Administered 2018-01-19 – 2018-01-29 (×2): 5 mg via ORAL
  Filled 2018-01-19 (×2): qty 1

## 2018-01-19 MED ORDER — SODIUM CHLORIDE 0.9% FLUSH
3.0000 mL | Freq: Two times a day (BID) | INTRAVENOUS | Status: DC
  Administered 2018-01-19 – 2018-02-08 (×33): 3 mL via INTRAVENOUS

## 2018-01-19 MED ORDER — HEPARIN SODIUM (PORCINE) 5000 UNIT/ML IJ SOLN
5000.0000 [IU] | Freq: Three times a day (TID) | INTRAMUSCULAR | Status: DC
  Administered 2018-01-19 – 2018-01-24 (×15): 5000 [IU] via SUBCUTANEOUS
  Filled 2018-01-19 (×15): qty 1

## 2018-01-19 MED ORDER — SODIUM CHLORIDE 0.9 % IV SOLN
INTRAVENOUS | Status: DC
  Administered 2018-01-19: 06:00:00 via INTRAVENOUS

## 2018-01-19 MED ORDER — FENTANYL CITRATE (PF) 100 MCG/2ML IJ SOLN
INTRAMUSCULAR | Status: DC | PRN
  Administered 2018-01-19: 12.5 ug via INTRAVENOUS

## 2018-01-19 MED ORDER — SODIUM CHLORIDE 0.9 % IV SOLN: 250.0000 mL | INTRAVENOUS | Status: DC | PRN

## 2018-01-19 SURGICAL SUPPLY — 10 items
CATH SWAN GANZ 7F STRAIGHT (CATHETERS) ×1 IMPLANT
KIT MICROPUNCTURE NIT STIFF (SHEATH) ×1 IMPLANT
PACK CARDIAC CATHETERIZATION (CUSTOM PROCEDURE TRAY) ×1 IMPLANT
PROTECTION STATION PRESSURIZED (MISCELLANEOUS) ×2
SHEATH PINNACLE 7F 10CM (SHEATH) ×1 IMPLANT
SHEATH PROBE COVER 6X72 (BAG) ×1 IMPLANT
STATION PROTECTION PRESSURIZED (MISCELLANEOUS) IMPLANT
TRANSDUCER W/STOPCOCK (MISCELLANEOUS) ×1 IMPLANT
TUBING ART PRESS 72  MALE/FEM (TUBING) ×1
TUBING ART PRESS 72 MALE/FEM (TUBING) IMPLANT

## 2018-01-19 NOTE — Progress Notes (Signed)
[facility] Progress Note  Name: Andrew Guerra MRN: [id number] DOB: 12/27/1940   Subjective:  Tolerated HD yesterday with 2L UF.  No weight doc this AM. RHC planned for today to evaluate filling pressures.   Review of systems:   Reports shortness of breath improving No chest pain Denies n/v   --------------------------- Background on Consult:  Andrew Guerra is a 78 y.o. male with a history of HTN and aortic stenosis who presented with GI bleed and hemoglobin 6.5 with PCP and report of black stool.  Found also with fluid overload/anasarca and CHF.  He has been evaluated by cardiology and has received lasix BID; only had 150 mL UOP charted on 12/26.  Work-up thus far notable for renal ultrasound without hydro and with trace abdominal ascites and prostatomegaly.  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  Spoke with patient as well a his wife and daughter.  He reports that his shortness of breath and swelling have worsened since around October of this year.  He reports that he had been on lasix daily for a couple of weeks prior to admission and had just been changed to torsemide when admitted.  He does have some urinary hesitancy and weak stream.  He has followed with urology previously for elevated PSA (which he reports to be around 7) but has not seen nephrology.  He denies NSAID use.  He would want dialysis if indicated.   Work-up has included ANA neg   Intake/Output Summary (Last 24 hours) at 01/19/2018 1058 Last data filed at 01/18/2018 1300 Gross per 24 hour  Intake 300 ml  Output -  Net 300 ml    Vitals:  Vitals:   01/19/18 0746 01/19/18 0751 01/19/18 0756 01/19/18 0756  BP: 133/63 129/61  130/65  Pulse: 93 (!) 107 89 93  Resp: 20 18 20 15   Temp:      TempSrc:      SpO2: 95% 94% 95% 95%  Weight:      Height:         Physical Exam:  General adult male in bed in no acute distress at rest HEENT normocephalic atraumatic  extraocular movements intact sclera anicteric Neck supple trachea midline Lungs clear to auscultation bilaterally normal work of breathing at rest  Heart regular rate and rhythm on my exam  Abdomen soft nontender nondistended Extremities 2-3+ edema bilateral lower extremities Psych normal mood and affect GU foley catheter minimal UOP Neuro alert and oriented x 3; conversant   Medications reviewed   Labs:  BMP Latest Ref Rng & Units 01/19/2018 01/18/2018 01/17/2018  Glucose 70 - 99 mg/dL 121(H) 130(H) 123(H)  BUN 8 - 23 mg/dL 53(H) 68(H) 92(H)  Creatinine 0.61 - 1.24 mg/dL 2.51(H) 2.42(H) 2.58(H)  BUN/Creat Ratio 10 - 24 - - -  Sodium 135 - 145 mmol/L 140 138 137  Potassium 3.5 - 5.1 mmol/L 3.5 3.6 3.7  Chloride 98 - 111 mmol/L 103 102 101  CO2 22 - 32 mmol/L 27 30 26   Calcium 8.9 - 10.3 mg/dL 7.8(L) 7.9(L) 8.1(L)    Assessment/Plan:  # AKI on CKD:  Cr in the 1.3-1.5 range through 11/2016...Marland Kitchennext value is 10/2017 1.7 > 11/1 1.8....12/23 admission 2.6.  CT/renal US with cysts, no obstruction, normal echogenicity.  UP/C is 1490, no hematuria on UA.  Albumin in the 1s-2s.  SPEP and serum free LC normal.  ANA neg.  Liver looks overall ok on CT.   He required initiation  of HD 12/30 for worsening azotemia and continued peripheral volume overload not responsive to lasix drip.   Remains oliguric.  Diuretics have been held due to lack of efficacy.  Maintain foley today but will likely remove tomorrow and follow bladder scans.  Will plan for HD again tomorrow.    Considering renal biopsy once he is more medically optimized.    # Acute on chronic diastolic CHF  - diuretics on hold due to lack of efficacy - cardiology following - right heart cath today - prior concern for hemachromatosis  - UF with HD tomorrow  # Proteinuria - subnephrotic - SPEP negative and serum free light chains normal.  UPEP does not appear collected - MD spoke with RN yesterday, still pending - Note profound  hypoalbuminemia - out of proportion to proteinuria alone - HbA1c normal at 5.0  # Acute hypoxic respiratory failure  - improving with volume removal with HD  - continue supplemental oxygen   # Acute GI bleed - s/p GI eval - s/p PRBC's  # Anemia - secondary to acute blood loss  - Stable/improved and s/p PRBC's   # Aortic stenosis - Noted - Cardiology following   # Prostatomegaly  - Started flomax (new this admission) for prostatomegaly with hesitancy then flomax paused with new confusion/marginal BP per family request - Continue foley for now and reassess  Justin Mend, MD 01/19/2018 10:58 AM

## 2018-01-19 NOTE — Progress Notes (Signed)
Patient Demographics:    Andrew Guerra, is a 78 y.o. male, DOB - 05/22/1940, HAL:[id number]  Admit date - 01/09/2018   Admitting Physician [name], DO  Outpatient Primary MD for the patient is [name], MD  LOS - 10   Chief Complaint  Patient presents with  . 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 The degree of leftward septal   shift and D shaped septum has worsened. There is now a   trivial-small pericardial effusion. Side by side comparison of   images performed  Brief Summary 78 y.o. male with a PMH of HTN, HLD, bicuspid aortic valve with moderate AS, NSCLC s/p resection and radiation, and PUD admitted from PCPs office on 01/09/2018 with hemoglobin of 6.4, patient with volume overload and anasarca, patient will be initiated on hemodialysis on 01/16/2018 to try to address his volume status, had RHC on 01/19/2018  Plan:- 1)Acute Gi Bleed--- status post EGD on 01/10/2018 with nonbleeding gastric and duodenal ulcers, biopsies are pending, continue PPI twice daily, monitor  H&H and transfuse as clinically indicated, hemoglobin is stable at 9.1  Post-transfusion of 2 units of packed cells this admission  2)HFpEF--acute on chronic diastolic dysfunction CHF exacerbation, last known EF 60 to 65% with moderate aortic stenosis... 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  ANA neg.  Liver looks overall ok on CT, may need renal biopsy  4) acute blood loss anemia--- secondary to #1 above, admission hemoglobin was 6.4, hemoglobin is stable > 9,  post transfusion of 2 units of packed cells,  5)  leukocytosis-- ??  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  10) transient episode of confusion/delirium/disorientation--- ???  Uremia related, now resolved,, continue to monitor closely.... May be hospital psychosis,   11) episodes of bradycardia post hemodialysis----EKG reviewed with Dr. Marlou Porch from cardiology on 01/18/2018, no acute concerns, patient remains chest pain-free, okay to decrease Coreg to 3.125 mg twice daily  Disposition/Need for in-Hospital Stay- patient unable to be discharged at this time due to significant worsening of heart failure, Renal failure and volume overload requiring further diuresis and monitoring (see subjective section above), patient will need additional hemodialysis, still awaiting further clinical and lab data to determine if patient will need long-term hemodialysis in which case --- patient will need outpatient hemodialysis chair/spot prior to discharge----  Code Status : Full  Family Communication:   Wife at bedside  Disposition Plan  : To be determined--nephrology need to determine if patient needs short-term versus long-term hemodialysis  Consults  : Cardiology/GI/Nephrology/IR for HD catheter placement.... Consider hematology consult   DVT Prophylaxis  :  TEDS/ SCDs   Lab Results  Component Value Date   PLT 338 01/19/2018   Inpatient Medications  Scheduled Meds: . carvedilol  3.125 mg Oral BID WC  . Chlorhexidine Gluconate Cloth  6 each Topical Q0600  . feeding supplement  (NEPRO CARB STEADY)  237 mL Oral TID BM  . heparin  5,000 Units Subcutaneous Q8H  . mouth rinse  15 mL Mouth Rinse BID  . mometasone-formoterol  2 puff Inhalation BID  . multivitamin with minerals  1 tablet Oral Daily  . Netarsudil-Latanoprost  1 drop Both Eyes QHS  . pantoprazole  40 mg Oral BID  . sodium chloride flush  3 mL Intravenous Q12H  . traZODone  100 mg Oral QHS   Continuous Infusions: . sodium chloride     PRN Meds:.sodium chloride, acetaminophen **OR** acetaminophen, heparin, lidocaine, ondansetron **OR** ondansetron (ZOFRAN) IV, sodium chloride flush   Anti-infectives (From admission, onward)   Start     Dose/Rate Route Frequency Ordered Stop   01/16/18 1230  ceFAZolin (ANCEF) IVPB 2g/100 mL premix    Note to Pharmacy:  Pre IR procedure, procedure time 12/30 not yet known   2 g 200 mL/hr over 30 Minutes Intravenous To Surgery 01/15/18 1112 01/17/18 1230        Objective:   Vitals:   01/19/18 0751 01/19/18 0756 01/19/18 0756 01/19/18 1413  BP: 129/61  130/65 (!) 104/52  Pulse: (!) 107 89 93 86  Resp: 18 20 15  20  Temp:    97.8 F (36.6 C)  TempSrc:    Oral  SpO2: 94% 95% 95% 98%  Weight:      Height:        Wt Readings from Last 3 Encounters:  01/18/18 92.7 kg  01/09/18 99.7 kg  12/14/17 88.5 kg    Intake/Output Summary (Last 24 hours) at 01/19/2018 1924 Last data filed at 01/19/2018 1900 Gross per 24 hour  Intake 240 ml  Output -  Net 240 ml    Physical Exam Patient is examined daily including today on 01/19/2018  , exams remain the same as of yesterday except that has changed   Gen:- Awake Alert, able to speak in sentences HEENT:- [place].AT, No sclera icterus Neck-Supple Neck, +ve JVD,.  Right-sided HD catheter Lungs-improving air movement bilaterally, no wheezing  CV- S1, S2 normal, regular 3/6 SM Abd-  +ve B.Sounds, Abd Soft, No tenderness,    Extremity/Skin:- improving edema/anasarca extending from the toes into the lower abdomen with   involvement of the penis and scrotum, has TEDs, pedal pulses present  Psych-affect is appropriate, oriented x3  Neuro-no new focal deficits, no tremors GU--- patient with improving  scrotal and penile edema, foley to be removed per nephrologist  Data Review:   Micro Results Recent Results (from the past 240 hour(s))  Urine Culture     Status: Abnormal   Collection Time: 01/13/18  8:25 AM  Result Value Ref Range Status   Specimen Description URINE, CLEAN CATCH  Final   Special Requests   Final    Normal Performed at [facility], [place] [address], [place], [place] [address]    Culture (A)  Final    >=100,000 COLONIES/mL MULTIPLE SPECIES PRESENT, SUGGEST RECOLLECTION   Report Status 01/14/2018 FINAL  Final  Culture, blood (Routine X 2) w Reflex to ID Panel     Status: None   Collection Time: 01/13/18  8:50 AM  Result Value Ref Range Status   Specimen Description BLOOD BLOOD LEFT HAND  Final   Special Requests   Final    BOTTLES DRAWN AEROBIC AND ANAEROBIC Blood Culture adequate volume   Culture   Final    NO GROWTH 5 DAYS Performed at [facility], [place] [address], [place], [place] [address]    Report Status 01/18/2018 FINAL  Final    Radiology Reports Ct Abdomen Pelvis Wo Contrast  Result Date: 01/09/2018 CLINICAL DATA:  Low hemoglobin. EXAM: CT ABDOMEN AND PELVIS WITHOUT CONTRAST TECHNIQUE: Multidetector CT imaging of the abdomen and pelvis was performed following the standard protocol without IV contrast. COMPARISON:  CT 02/04/2015, abdomen MRI 12/09/2017 FINDINGS: Lower chest: Small bilateral pleural effusions. Cardiomegaly without pericardial effusion or thickening. Atherosclerosis of the included thoracic aorta without aneurysm. Coronary arteriosclerosis is seen. Pulmonary consolidation/atelectasis in the right middle lobe distribution. Hepatobiliary: Layering biliary sludge within the gallbladder without secondary signs of acute cholecystitis. The unenhanced liver  demonstrates a granuloma in the right hepatic lobe. No definite mass or biliary dilatation. Pancreas: Unremarkable. No pancreatic ductal dilatation or surrounding inflammatory changes. Spleen: Normal size spleen with scattered calcifications noted within possibly representing vascular calcifications or granulomata. Adrenals/Urinary Tract: Scattered bilateral hyperdense lesions of the kidneys are nonspecific but may represent proteinaceous or hemorrhagic cysts, the largest is right-sided and interpolar measuring up 1.6 cm with more ill-defined hyperdensity slightly exophytic off the right kidney. No nephrolithiasis or obstructive uropathy. The urinary bladder is unremarkable for the degree of distention. Stomach/Bowel: Stomach is within normal  limits. The appendix is not confidently identified. No evidence of bowel wall thickening, distention, or inflammatory changes. Vascular/Lymphatic: Aortic atherosclerosis. No lymphadenopathy. Reproductive: Prostatomegaly measuring up to 5.7 cm. Other: Diffuse soft tissue anasarca. No ascites or free air. 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 IMPRESSION: 1. Pulmonary consolidation/atelectasis in the right middle lobe with small bilateral pleural effusions. 2. Scattered bilateral hyperdense lesions of the kidneys likely to represent proteinaceous or hemorrhagic cysts, the largest is in the right interpolar and interpolar measuring 1.6 cm with more ill-defined hyperdensity slightly exophytic off the right kidney. Findings better characterized on recent prior MRI. 3. Hepatic and splenic granulomata. 4. Diffuse soft tissue anasarca. 5. 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 Aortic Atherosclerosis (ICD10-I70.0). Electronically Signed   By: Ashley Royalty M.D.   On: 01/09/2018 20:33   Dg Chest 2 View  Result Date: 01/14/2018 CLINICAL DATA:  Shortness of breath EXAM: CHEST - 2 VIEW COMPARISON:  Chest radiograph 01/09/2018 FINDINGS: Monitoring leads overlie the  patient. Stable cardiac and mediastinal contours. Similar-appearing patchy consolidation within the right greater than left lower lungs. Moderate right and small left pleural effusions. No pneumothorax. IMPRESSION: Similar-appearing patchy consolidation within the right greater than left lower lungs with moderate right and small left pleural effusions. Electronically Signed   By: Lovey Newcomer M.D.   On: 01/14/2018 16:08   US Renal  Result Date: 01/13/2018 CLINICAL DATA:  Oliguria.  Elevated PSA. EXAM: RENAL / URINARY TRACT ULTRASOUND COMPLETE COMPARISON:  CT abdomen and pelvis 01/09/2018. FINDINGS: Right Kidney: Renal measurements: 10.5 x 4.6 x 5.0 cm = volume: 126.8 mL . Echogenicity within normal limits. A simple cyst measuring 1.9 cm in diameter is identified. No hydronephrosis visualized. Trace amount of perihepatic ascites is noted. Left Kidney: Renal measurements: 11.4 x 5.5 x 5.1 cm = volume: 163.2 mL. Echogenicity within normal limits. No mass or hydronephrosis visualized. Bladder: The bladder is almost completely decompressed. No focal abnormality. Prostatomegaly is seen. IMPRESSION: Negative for hydronephrosis or acute abnormality. Prostatomegaly. Trace amount of ascites. Electronically Signed   By: Inge Rise M.D.   On: 01/13/2018 11:29   Ir Fluoro Guide Cv Line Right  Result Date: 01/16/2018 INDICATION: 78 year old male with acute renal failure in need of hemodialysis. We had an initial plan for placement of a tunneled hemodialysis catheter, however he has leukocytosis today as well as new onset atrial fibrillation with uncontrolled rapid ventricular response. Therefore, we will place a non tunneled hemodialysis catheter to allow him to get hemodialysis quickly without the additional stress of moderate sedation. We can then convert the non tunneled catheter to a tunneled device once his other issues have been addressed. EXAM: IR RIGHT FLOURO GUIDE CV LINE; IR ULTRASOUND GUIDANCE VASC  ACCESS RIGHT MEDICATIONS: None ANESTHESIA/SEDATION: None FLUOROSCOPY TIME:  Fluoroscopy Time: 0 minutes 12 seconds (1 mGy). COMPLICATIONS: None immediate. PROCEDURE: Informed written consent was obtained from the patient after a thorough discussion of the procedural risks, benefits and alternatives. All questions were addressed. Maximal Sterile Barrier Technique was utilized including caps, mask, sterile gowns, sterile gloves, sterile drape, hand hygiene and skin antiseptic. A timeout was performed prior to the initiation of the procedure. The right internal jugular vein was interrogated with ultrasound and found to be widely patent. An image was obtained and stored for the medical record. Local anesthesia was attained by infiltration with 1% lidocaine. A small dermatotomy was made. Under real-time sonographic guidance, the vessel was punctured with an 18 gauge needle. A 0.035 wire was advanced into  the inferior vena cava. The needle was removed. The skin tract was dilated and a 16 cm triple-lumen hemodialysis catheter was advanced over the wire and positioned with the catheter tip at the superior cavoatrial junction. The catheter was aspirated and then flushed with heparinized saline. The catheter was then secured to the skin with 0 Prolene suture and a sterile bandage. The patient tolerated the procedure well. IMPRESSION: Successful placement of a non tunneled hemodialysis catheter via the right internal jugular vein. The tip of the catheter is at the superior cavoatrial junction and the catheter is ready for immediate use. Electronically Signed   By: Jacqulynn Cadet M.D.   On: 01/16/2018 14:05   Ir US Guide Vasc Access Right  Result Date: 01/16/2018 INDICATION: 78 year old male with acute renal failure in need of hemodialysis. We had an initial plan for placement of a tunneled hemodialysis catheter, however he has leukocytosis today as well as new onset atrial fibrillation with uncontrolled rapid  ventricular response. Therefore, we will place a non tunneled hemodialysis catheter to allow him to get hemodialysis quickly without the additional stress of moderate sedation. We can then convert the non tunneled catheter to a tunneled device once his other issues have been addressed. EXAM: IR RIGHT FLOURO GUIDE CV LINE; IR ULTRASOUND GUIDANCE VASC ACCESS RIGHT MEDICATIONS: None ANESTHESIA/SEDATION: None FLUOROSCOPY TIME:  Fluoroscopy Time: 0 minutes 12 seconds (1 mGy). COMPLICATIONS: None immediate. PROCEDURE: Informed written consent was obtained from the patient after a thorough discussion of the procedural risks, benefits and alternatives. All questions were addressed. Maximal Sterile Barrier Technique was utilized including caps, mask, sterile gowns, sterile gloves, sterile drape, hand hygiene and skin antiseptic. A timeout was performed prior to the initiation of the procedure. The right internal jugular vein was interrogated with ultrasound and found to be widely patent. An image was obtained and stored for the medical record. Local anesthesia was attained by infiltration with 1% lidocaine. A small dermatotomy was made. Under real-time sonographic guidance, the vessel was punctured with an 18 gauge needle. A 0.035 wire was advanced into the inferior vena cava. The needle was removed. The skin tract was dilated and a 16 cm triple-lumen hemodialysis catheter was advanced over the wire and positioned with the catheter tip at the superior cavoatrial junction. The catheter was aspirated and then flushed with heparinized saline. The catheter was then secured to the skin with 0 Prolene suture and a sterile bandage. The patient tolerated the procedure well. IMPRESSION: Successful placement of a non tunneled hemodialysis catheter via the right internal jugular vein. The tip of the catheter is at the superior cavoatrial junction and the catheter is ready for immediate use. Electronically Signed   By: Jacqulynn Cadet  M.D.   On: 01/16/2018 14:05   Dg Chest Portable 1 View  Result Date: 01/09/2018 CLINICAL DATA:  Shortness of breath for 1 day. 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 EXAM: PORTABLE CHEST 1 VIEW COMPARISON:  Radiographs 11/09/2017. Abdominal MRI 12/09/2017. PET-CT 11/25/2015. FINDINGS: 1902 hours. There are right-greater-than-left pleural effusions which have enlarged. There is associated bibasilar pulmonary opacity, likely atelectasis. There are stable postsurgical changes in the right hilar region. No pneumothorax or acute osseous findings are seen. IMPRESSION: Enlarging right-greater-than-left pleural effusions with associated bibasilar pulmonary opacities, likely atelectasis. No invert pulmonary edema. Electronically Signed   By: Richardean Sale M.D.   On: 01/09/2018 19:35     CBC Recent Labs  Lab 01/13/18 0354 01/14/18 1015 01/16/18 0456 01/17/18 0850 01/19/18  0354  WBC 23.8* 19.5* 20.1* 18.5* 19.9*  HGB 8.4* 8.9* 9.1* 8.3* 9.1*  HCT 26.7* 27.3* 28.2* 27.1* 28.9*  PLT 340 338 335 297 338  MCV 74.6* 75.6* 76.2* 77.2* 75.3*  MCH 23.5* 24.7* 24.6* 23.6* 23.7*  MCHC 31.5 32.6 32.3 30.6 31.5  RDW 20.9* 21.0* 21.6* 22.2* 21.7*    Chemistries  Recent Labs  Lab 01/13/18 0354  01/15/18 0410 01/16/18 0456 01/17/18 0410 01/18/18 0353 01/19/18 0354  NA 136   < > 137 137 137 138 140  K 3.8   < > 3.5 3.6 3.7 3.6 3.5  CL 100   < > 100 99 101 102 103  CO2 23   < > 26 26 26 30 27   GLUCOSE 112*   < > 109* 126* 123* 130* 121*  BUN 89*   < > 107* 107* 92* 68* 53*  CREATININE 2.96*   < > 3.03* 2.84* 2.58* 2.42* 2.51*  CALCIUM 7.8*   < > 8.0* 8.0* 8.1* 7.9* 7.8*  AST 40  --   --  26  --   --   --   ALT 41  --   --  36  --   --   --   ALKPHOS 92  --   --  78  --   --   --   BILITOT 0.5  --   --  0.4  --   --   --    < > = values in this interval not displayed.    Lab Results  Component Value Date   HGBA1C 5.0 01/14/2018    -----------------------------------------------------------------------------------------------------------------    Component Value Date/Time   BNP 598.0 (H) 01/09/2018 1205    Roxan Hockey M.D on 01/19/2018 at 7:24 PM  Pager---[phone] Go to [email or web address] - password [email or web address] for contact info  [facility] - Office  [phone]

## 2018-01-19 NOTE — Interval H&P Note (Signed)
History and Physical Interval Note:  01/19/2018 7:16 AM  Andrew Guerra  has presented today for cardiac catheterization, with the diagnosis of heart failure and pulmonary hypertension. The various methods of treatment have been discussed with the patient and family. After consideration of risks, benefits and other options for treatment, the patient has consented to  Procedure(s): RIGHT HEART CATH (N/A) as a surgical intervention .  The patient's history has been reviewed, patient examined, no change in status, stable for surgery.  I have reviewed the patient's chart and labs.  Questions were answered to the patient's satisfaction.    [name]

## 2018-01-20 ENCOUNTER — Telehealth: Payer: Self-pay

## 2018-01-20 DIAGNOSIS — N179 Acute kidney failure, unspecified: Secondary | ICD-10-CM

## 2018-01-20 DIAGNOSIS — D649 Anemia, unspecified: Secondary | ICD-10-CM

## 2018-01-20 LAB — RENAL FUNCTION PANEL
Albumin: 1.6 g/dL — ABNORMAL LOW (ref 3.5–5.0)
Anion gap: 9 (ref 5–15)
BUN: 62 mg/dL — ABNORMAL HIGH (ref 8–23)
CO2: 27 mmol/L (ref 22–32)
Calcium: 7.8 mg/dL — ABNORMAL LOW (ref 8.9–10.3)
Chloride: 102 mmol/L (ref 98–111)
Creatinine, Ser: 3.24 mg/dL — ABNORMAL HIGH (ref 0.61–1.24)
GFR calc Af Amer: 20 mL/min — ABNORMAL LOW (ref >60)
GFR calc non Af Amer: 17 mL/min — ABNORMAL LOW (ref >60)
GLUCOSE: 111 mg/dL — AB (ref 70–99)
Phosphorus: 3.4 mg/dL (ref 2.5–4.6)
Potassium: 3.6 mmol/L (ref 3.5–5.1)
SODIUM: 138 mmol/L (ref 135–145)

## 2018-01-20 LAB — CBC
HCT: 28.3 % — ABNORMAL LOW (ref 39.0–52.0)
Hemoglobin: 8.7 g/dL — ABNORMAL LOW (ref 13.0–17.0)
MCH: 23.7 pg — AB (ref 26.0–34.0)
MCHC: 30.7 g/dL (ref 30.0–36.0)
MCV: 77.1 fL — ABNORMAL LOW (ref 80.0–100.0)
Platelets: 375 10*3/uL (ref 150–400)
RBC: 3.67 MIL/uL — ABNORMAL LOW (ref 4.22–5.81)
RDW: 22.2 % — ABNORMAL HIGH (ref 11.5–15.5)
WBC: 15.8 10*3/uL — ABNORMAL HIGH (ref 4.0–10.5)
nRBC: 0 % (ref 0.0–0.2)

## 2018-01-20 LAB — POCT I-STAT, CHEM 8
BUN: 26 mg/dL — ABNORMAL HIGH (ref 8–23)
CHLORIDE: 97 mmol/L — AB (ref 98–111)
Calcium, Ion: 1.09 mmol/L — ABNORMAL LOW (ref 1.15–1.40)
Creatinine, Ser: 1.5 mg/dL — ABNORMAL HIGH (ref 0.61–1.24)
GLUCOSE: 121 mg/dL — AB (ref 70–99)
HCT: 28 % — ABNORMAL LOW (ref 39.0–52.0)
Hemoglobin: 9.5 g/dL — ABNORMAL LOW (ref 13.0–17.0)
Potassium: 3.6 mmol/L (ref 3.5–5.1)
Sodium: 137 mmol/L (ref 135–145)
TCO2: 27 mmol/L (ref 22–32)

## 2018-01-20 MED ORDER — SODIUM CHLORIDE 0.9 % IV SOLN: 100.0000 mL | INTRAVENOUS | Status: DC | PRN

## 2018-01-20 MED ORDER — CHLORHEXIDINE GLUCONATE CLOTH 2 % EX PADS
6.0000 | MEDICATED_PAD | Freq: Every day | CUTANEOUS | Status: DC
  Administered 2018-01-20 – 2018-02-09 (×20): 6 via TOPICAL

## 2018-01-20 MED ORDER — HEPARIN SODIUM (PORCINE) 1000 UNIT/ML IJ SOLN
INTRAMUSCULAR | Status: AC
  Administered 2018-01-20: 1000 [IU] via INTRAVENOUS
  Filled 2018-01-20: qty 4

## 2018-01-20 MED ORDER — CHLORHEXIDINE GLUCONATE CLOTH 2 % EX PADS
6.0000 | MEDICATED_PAD | Freq: Every day | CUTANEOUS | Status: DC
  Administered 2018-01-20: 6 via TOPICAL

## 2018-01-20 MED ORDER — ALTEPLASE 2 MG IJ SOLR: 2.0000 mg | Freq: Once | INTRAMUSCULAR | Status: DC | PRN

## 2018-01-20 MED ORDER — AMIODARONE LOAD VIA INFUSION
150.0000 mg | Freq: Once | INTRAVENOUS | Status: AC
  Administered 2018-01-20: 150 mg via INTRAVENOUS
  Filled 2018-01-20: qty 83.34

## 2018-01-20 MED ORDER — AMIODARONE HCL IN DEXTROSE 360-4.14 MG/200ML-% IV SOLN
60.0000 mg/h | INTRAVENOUS | Status: AC
  Administered 2018-01-20: 60 mg/h via INTRAVENOUS
  Filled 2018-01-20: qty 200

## 2018-01-20 MED ORDER — AMIODARONE HCL IN DEXTROSE 360-4.14 MG/200ML-% IV SOLN
30.0000 mg/h | INTRAVENOUS | Status: DC
  Administered 2018-01-20: 30 mg/h via INTRAVENOUS
  Filled 2018-01-20 (×3): qty 200

## 2018-01-20 MED FILL — Heparin Sod (Porcine)-NaCl IV Soln 1000 Unit/500ML-0.9%: INTRAVENOUS | Qty: 500 | Status: AC

## 2018-01-20 NOTE — Telephone Encounter (Signed)
-----   Message from Willia Craze, NP sent at 01/17/2018  9:13 AM EST ----- Hey, sorry I should have clarified. I know, I need to check a cbc next week as I anticipate he will be out out the hospital within next couple of days. Thanks ----- Message ----- From: Greggory Keen, LPN Sent: [id number]/48/3507   4:51 PM EST To: Willia Craze, NP  He is presently a patient in the hospital. He had a CBC drawn today. ----- Message ----- From: Willia Craze, NP Sent: 01/16/2018   4:16 PM EST To: Greggory Keen, LPN  Beth, will you call patient next week to come in for a cbc to follow up on anemia. Thanks

## 2018-01-20 NOTE — Progress Notes (Signed)
  Amiodarone Drug - Drug Interaction Consult Note  Recommendations: N/A  Amiodarone is metabolized by the cytochrome P450 system and therefore has the potential to cause many drug interactions. Amiodarone has an average plasma half-life of 50 days (range 20 to 100 days).   There is potential for drug interactions to occur several weeks or months after stopping treatment and the onset of drug interactions may be slow after initiating amiodarone.   []  Statins: Increased risk of myopathy. Simvastatin- restrict dose to 20mg  daily. Other statins: counsel patients to report any muscle pain or weakness immediately.  []  Anticoagulants: Amiodarone can increase anticoagulant effect. Consider warfarin dose reduction. Patients should be monitored closely and the dose of anticoagulant altered accordingly, remembering that amiodarone levels take several weeks to stabilize.  []  Antiepileptics: Amiodarone can increase plasma concentration of phenytoin, the dose should be reduced. Note that small changes in phenytoin dose can result in large changes in levels. Monitor patient and counsel on signs of toxicity.  [x]  Beta blockers: increased risk of bradycardia, AV block and myocardial depression. Sotalol - avoid concomitant use.  []   Calcium channel blockers (diltiazem and verapamil): increased risk of bradycardia, AV block and myocardial depression.  []   Cyclosporine: Amiodarone increases levels of cyclosporine. Reduced dose of cyclosporine is recommended.  []  Digoxin dose should be halved when amiodarone is started.  []  Diuretics: increased risk of cardiotoxicity if hypokalemia occurs.  []  Oral hypoglycemic agents (glyburide, glipizide, glimepiride): increased risk of hypoglycemia. Patient's glucose levels should be monitored closely when initiating amiodarone therapy.   []  Drugs that prolong the QT interval:  Torsades de pointes risk may be increased with concurrent use - avoid if possible.  Monitor QTc,  also keep magnesium/potassium WNL if concurrent therapy can't be avoided. [name] Antibiotics: e.g. fluoroquinolones, erythromycin. . Antiarrhythmics: e.g. quinidine, procainamide, disopyramide, sotalol. . Antipsychotics: e.g. phenothiazines, haloperidol.  . Lithium, tricyclic antidepressants, and methadone. Thank You,  Remigio Eisenmenger D. Mina Marble, PharmD, BCPS, [place] 01/20/2018, 12:43 PM

## 2018-01-20 NOTE — Progress Notes (Signed)
PROGRESS NOTE  Andrew Guerra WVP:[id number] DOB: Nov 26, 1940 DOA: 01/09/2018 PCP: [name], MD   LOS: 11 days   Brief Narrative / Interim history: 78 y.o.malewith a PMH of HTN, HLD, bicuspid aortic valve with moderate AS, NSCLC s/p resection and radiation, and PUD admitted from PCPs office on 01/09/2018 with hemoglobin of 6.4, patient with volume overload and anasarca, patient will be initiated on hemodialysis on 01/16/2018 to try to address his volume status, had RHC on 01/19/2018  Subjective: Seen in hemodialysis, no complaints.  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  He received a total of 2 units of packed red blood cells during this admission  Additional problems: HFpEF -acute on chronic diastolic dysfunction CHF exacerbation, last known EF 60 to 65% with moderate aortic stenosis. 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 ANA neg. Liver looks overall ok on CT, may need renal biopsy -Discussed with Dr. Johnney Ou today, no clear etiology as to his renal failure.  We will hold dialysis tomorrow and obtain a 24-hour urine collection  Acute blood loss anemia -secondary to #1 above, admission hemoglobin was 6.4, hemoglobin is stable > 9,  post transfusion of 2 units of packed cells,  Bradycardia post hemodialysis -Prior to today he has been bradycardic postdialysis, Coreg dose was decreased  A. fib with RVR -Onset in the setting of dialysis today, cardiology following, given soft blood pressures patient was started on amiodarone.  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 Scheduled to see hematology outpatient but not until 03/2018, will be judicious with blood transfusion  Moderate aortic stenosis -most likely contributing to #2 above,    NSCLC - post prior surgery and radiation, patient goes to [facility] every 6 months Looks like patient had this surgically resected, then had recurrence followed by SBRT with shrinking of the recurrent tumor according to oncology office visit notes.  Scheduled Meds: . carvedilol  3.125 mg Oral BID WC  . Chlorhexidine Gluconate Cloth  6  each Topical [id number]  . feeding supplement (NEPRO CARB STEADY)  237 mL Oral TID BM  . heparin  5,000 Units Subcutaneous Q8H  . mouth rinse  15 mL Mouth Rinse  BID  . mometasone-formoterol  2 puff Inhalation BID  . multivitamin with minerals  1 tablet Oral Daily  . Netarsudil-Latanoprost  1 drop Both Eyes QHS  . pantoprazole  40 mg Oral BID  . sodium chloride flush  3 mL Intravenous Q12H   Continuous Infusions: . sodium chloride    . amiodarone 60 mg/hr (01/20/18 1343)   Followed by  . amiodarone     PRN Meds:.sodium chloride, acetaminophen **OR** acetaminophen, heparin, lidocaine, ondansetron **OR** ondansetron (ZOFRAN) IV, sodium chloride flush, zolpidem  DVT prophylaxis: Heparin Code Status: Full code Family Communication: No family at bedside, seen in hemodialysis Disposition Plan: To be determined  Consultants:   Cardiology  Nephrology  Procedures:   Right heart cath 1/2 Conclusions: 1. Normal left heart filling pressure. 2. Mildly elevated right heart filling pressure. 3. 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 4. Normal cardiac output/index.  Recommendations: 1. Medical therapy.  2D echo Impressions:  Compared to the exam from 10/24/17, the degree of right ventricular dilation has increased, and right ventricular systolic function has decreased. The degree of leftward septal shift and D shaped septum has worsened. There is now a trivial-small pericardial effusion. Side by side comparison of images performed.  Antimicrobials:  None    Objective: Vitals:   01/20/18 1030 01/20/18 1036 01/20/18 1132 01/20/18 1335  BP: (!) 117/53 113/60 103/73 99/64  Pulse: 83 85 (!) 124   Resp:  18    Temp:  97.9 F (36.6 C)    TempSrc:  Oral    SpO2:  97%    Weight:  92.8 kg    Height:        Intake/Output Summary (Last 24 hours) at 01/20/2018 1410 Last data filed at 01/20/2018 1036 Gross per 24 hour  Intake 240 ml  Output 1083 ml  Net -843 ml   Filed Weights   01/20/18 0500 01/20/18 0654 01/20/18 1036  Weight: 93.3 kg 93.7 kg 92.8 kg    Examination:  Constitutional: NAD Eyes: lids and conjunctivae normal ENMT:  Mucous membranes are moist.  Neck: normal, supple Respiratory: Clear to auscultation anterior fields without wheezing, appears tachypneic Cardiovascular: Irregularly irregular, tachycardic, no murmurs appreciated.  2+ peripheral edema Abdomen: no tenderness. Bowel sounds positive.  Musculoskeletal: no clubbing / cyanosis.  Skin: no rashes Neurologic: No focal deficits Psychiatric: Normal judgment and insight. Alert and oriented x 3. Normal mood.    Data Reviewed: I have independently reviewed following labs and imaging studies   CBC: Recent Labs  Lab 01/14/18 1015 01/16/18 0456 01/17/18 0850 01/19/18 0354 01/20/18 0718 01/20/18 1007  WBC 19.5* 20.1* 18.5* 19.9* 15.8*  --   HGB 8.9* 9.1* 8.3* 9.1* 8.7* 9.5*  HCT 27.3* 28.2* 27.1* 28.9* 28.3* 28.0*  MCV 75.6* 76.2* 77.2* 75.3* 77.1*  --   PLT 338 335 297 338 375  --    Basic Metabolic Panel: Recent Labs  Lab 01/16/18 0456 01/17/18 0410 01/18/18 0353 01/19/18 0354 01/20/18 0438 01/20/18 1007  NA 137 137 138 140 138 137  K 3.6 3.7 3.6 3.5 3.6 3.6  CL 99 101 102 103 102 97*  CO2 26 26 30 27 27   --   GLUCOSE 126* 123* 130* 121* 111* 121*  BUN 107* 92* 68* 53* 62* 26*  CREATININE 2.84* 2.58* 2.42* 2.51* 3.24*  1.50*  CALCIUM 8.0* 8.1* 7.9* 7.8* 7.8*  --   PHOS 4.6 4.6 2.9 2.6 3.4  --    GFR: Estimated Creatinine Clearance: 46.1 mL/min (A) (by C-G formula based on SCr of 1.5 mg/dL (H)). Liver Function Tests: Recent Labs  Lab 01/16/18 0456 01/17/18 0410 01/18/18 0353 01/19/18 0354 01/20/18 0438  AST 26  --   --   --   --   ALT 36  --   --   --   --   ALKPHOS 78  --   --   --   --   BILITOT 0.4  --   --   --   --   PROT 4.7*  --   --   --   --   ALBUMIN 1.9* 1.7* 1.7* 1.7* 1.6*   No results for input(s): LIPASE, AMYLASE in the last 168 hours. No results for input(s): AMMONIA in the last 168 hours. Coagulation Profile: No results for input(s): INR, PROTIME in the last 168 hours. Cardiac Enzymes: No results for  input(s): CKTOTAL, CKMB, CKMBINDEX, TROPONINI in the last 168 hours. BNP (last 3 results) No results for input(s): PROBNP in the last 8760 hours. HbA1C: No results for input(s): HGBA1C in the last 72 hours. CBG: Recent Labs  Lab 01/17/18 1217  GLUCAP 100*   Lipid Profile: No results for input(s): CHOL, HDL, LDLCALC, TRIG, CHOLHDL, LDLDIRECT in the last 72 hours. Thyroid Function Tests: No results for input(s): TSH, T4TOTAL, FREET4, T3FREE, THYROIDAB in the last 72 hours. Anemia Panel: No results for input(s): VITAMINB12, FOLATE, FERRITIN, TIBC, IRON, RETICCTPCT in the last 72 hours. Urine analysis:    Component Value Date/Time   COLORURINE YELLOW 01/13/2018 1058   APPEARANCEUR HAZY (A) 01/13/2018 1058   LABSPEC 1.014 01/13/2018 1058   PHURINE 5.0 01/13/2018 1058   GLUCOSEU NEGATIVE 01/13/2018 1058   HGBUR NEGATIVE 01/13/2018 1058   BILIRUBINUR NEGATIVE 01/13/2018 1058   KETONESUR NEGATIVE 01/13/2018 1058   PROTEINUR 100 (A) 01/13/2018 1058   NITRITE NEGATIVE 01/13/2018 1058   LEUKOCYTESUR NEGATIVE 01/13/2018 1058   Sepsis Labs: Invalid input(s): PROCALCITONIN, LACTICIDVEN  Recent Results (from the past 240 hour(s))  Urine Culture     Status: Abnormal   Collection Time: 01/13/18  8:25 AM  Result Value Ref Range Status   Specimen Description URINE, CLEAN CATCH  Final   Special Requests   Final    Normal Performed at [facility], [address], [place], [place] [address]    Culture (A)  Final    >=100,000 COLONIES/mL MULTIPLE SPECIES PRESENT, SUGGEST RECOLLECTION   Report Status 01/14/2018 FINAL  Final  Culture, blood (Routine X 2) w Reflex to ID Panel     Status: None   Collection Time: 01/13/18  8:50 AM  Result Value Ref Range Status   Specimen Description BLOOD BLOOD LEFT HAND  Final   Special Requests   Final    BOTTLES DRAWN AEROBIC AND ANAEROBIC Blood Culture adequate volume   Culture   Final    NO GROWTH 5 DAYS Performed at [facility],  [place] [address] [place], [place] [address]    Report Status 01/18/2018 FINAL  Final      Radiology Studies: No results found.  Marzetta Board, MD, PhD [facility] Pager [phone]  If 7PM-7AM, please contact night-coverage [email or web address] Password [email or web address] 01/20/2018, 2:10 PM

## 2018-01-20 NOTE — Progress Notes (Addendum)
Progress Note  Patient Name: Andrew Guerra Date of Encounter: 01/20/2018  Primary Cardiologist: Dr. Fransico Him, MD   Subjective   Patient had right heart catheterization done yesterday.  Showed mildly elevated right heart filling pressures and mild to moderate pulmonary hypertension with normal cardiac output and cardiac index.  Normal left heart filling pressures.  Medical therapy recommended.  He is feeling better on hemodialysis.  Went into atrial fibrillation with RVR this am. C/o some mild SOB Inpatient Medications    Scheduled Meds: . heparin      . carvedilol  3.125 mg Oral BID WC  . Chlorhexidine Gluconate Cloth  6 each Topical Q0600  . feeding supplement (NEPRO CARB STEADY)  237 mL Oral TID BM  . heparin  5,000 Units Subcutaneous Q8H  . mouth rinse  15 mL Mouth Rinse BID  . mometasone-formoterol  2 puff Inhalation BID  . multivitamin with minerals  1 tablet Oral Daily  . Netarsudil-Latanoprost  1 drop Both Eyes QHS  . pantoprazole  40 mg Oral BID  . sodium chloride flush  3 mL Intravenous Q12H   Continuous Infusions: . sodium chloride     PRN Meds: sodium chloride, acetaminophen **OR** acetaminophen, heparin, lidocaine, ondansetron **OR** ondansetron (ZOFRAN) IV, sodium chloride flush, zolpidem   Vital Signs    Vitals:   01/20/18 0900 01/20/18 0930 01/20/18 1000 01/20/18 1030  BP: (!) 89/40 90/62 (!) 99/55 (!) 117/53  Pulse: (!) 56 (!) 135 (!) 107 83  Resp:      Temp:      TempSrc:      SpO2:      Weight:      Height:        Intake/Output Summary (Last 24 hours) at 01/20/2018 1117 Last data filed at 01/20/2018 0233 Gross per 24 hour  Intake 240 ml  Output 200 ml  Net 40 ml   Filed Weights   01/18/18 1010 01/20/18 0500 01/20/18 0654  Weight: 92.7 kg 93.3 kg 93.7 kg   Physical Exam   GEN: Well nourished, well developed in no acute distress HEENT: Normal NECK: No JVD; No carotid bruits LYMPHATICS: No lymphadenopathy CARDIAC:irregularly irregular  and tachycardic, no murmurs, rubs, gallops RESPIRATORY:  Clear to auscultation without rales, wheezing or rhonchi  ABDOMEN: Soft, non-tender, non-distended MUSCULOSKELETAL:  2+ LE edema; No deformity  SKIN: Warm and dry NEUROLOGIC:  Alert and oriented x 3 PSYCHIATRIC:  Normal affect    Labs    Chemistry Recent Labs  Lab 01/16/18 0456  01/18/18 0353 01/19/18 0354 01/20/18 0438 01/20/18 1007  NA 137   < > 138 140 138 137  K 3.6   < > 3.6 3.5 3.6 3.6  CL 99   < > 102 103 102 97*  CO2 26   < > 30 27 27   --   GLUCOSE 126*   < > 130* 121* 111* 121*  BUN 107*   < > 68* 53* 62* 26*  CREATININE 2.84*   < > 2.42* 2.51* 3.24* 1.50*  CALCIUM 8.0*   < > 7.9* 7.8* 7.8*  --   PROT 4.7*  --   --   --   --   --   ALBUMIN 1.9*   < > 1.7* 1.7* 1.6*  --   AST 26  --   --   --   --   --   ALT 36  --   --   --   --   --  ALKPHOS 78  --   --   --   --   --   BILITOT 0.4  --   --   --   --   --   GFRNONAA 20*   < > 24* 23* 17*  --   GFRAA 23*   < > 28* 27* 20*  --   ANIONGAP 12   < > 6 10 9   --    < > = values in this interval not displayed.     Hematology Recent Labs  Lab 01/17/18 0850 01/19/18 0354 01/20/18 0718 01/20/18 1007  WBC 18.5* 19.9* 15.8*  --   RBC 3.51* 3.84* 3.67*  --   HGB 8.3* 9.1* 8.7* 9.5*  HCT 27.1* 28.9* 28.3* 28.0*  MCV 77.2* 75.3* 77.1*  --   MCH 23.6* 23.7* 23.7*  --   MCHC 30.6 31.5 30.7  --   RDW 22.2* 21.7* 22.2*  --   PLT 297 338 375  --     Cardiac EnzymesNo results for input(s): TROPONINI in the last 168 hours. No results for input(s): TROPIPOC in the last 168 hours.   BNPNo results for input(s): BNP, PROBNP in the last 168 hours.   DDimer No results for input(s): DDIMER in the last 168 hours.   Radiology    No results found.  Telemetry    Atrial fibrillation with RVR at 130 bpm- Personally Reviewed  ECG    Atrial fibrillation with RVR and  right bundle branch block aberration - Personally Reviewed  Cardiac Studies   Echocardiogram  01/13/2018: Impressions: EF 60 to 65% without regional wall motion normalities, - Compared to the exam from 10/24/17, the degree of right ventricular dilation has increased, and right ventricular systolic function has decreased. The degree of leftward septal shift and D shaped septum has worsened. There is now a trivial-small pericardial effusion. Side by side comparison of images performed  Patient Profile     78 y.o. male with a PMH of HTN, HLD, bicuspid aortic valve with moderate AS, NSCLC s/p resection and radiation, and PUD,who is being seen today for the evaluation of CHFat the request of Dr. Denton Brick.  Assessment & Plan    1.  Acute  diastolic CHF exacerbation: -Echocardiogram 01/13/18 with LVEF is 65% with moderate to severe RV dilation and moderate RV systolic dysfunction -Presented with fluid volume overload in the setting of hypoalbuminemia with an albumin of 1.8 worsening renal function -Echocardiogram from 04/2017 with mild to moderate RV dysfunction and moderate AS -Right heart cath demonstrated normal left-sided filling pressures and mildly elevated right-sided filling pressures as well as mild pulmonary hypertension.  Cardiac output was normal. -Continue on medical management with dialysis for volume management -His weight is down 13 pounds from admission  2.  CKD/ESRD: -HD catheter placed>> on HD per nephrology -Creatinine, 2.58 today, down to 1.5 with a baseline of 0.7  -Neurology is considering renal biopsy -SPEP and serum free LC normal -Currently there have been prior concerns for hemochromatosis  3.  History of GI bleed: -Patient presented with an acute GI bleed with hemoglobin of 6.4 which is now improved to 9.5 today>>s/p 2 units PRBC -found to have non bleeding gastric and duodenal ulcers on EGD -GI following/IM  4.  Atrial fibrillation with RVR  -Heart rate currently in the 130s -BP too soft for beta-blocker or calcium channel  blocker. -carvedilol was decreased early on for soft BP -Start amiodarone IV with 150mg  bolus over 20 min followed by  gtt at 60mg /hr x 6 hours then 30mg /hr.  -Will ask GI to give input into when it would be safe to anticoagulate patient as he was admitted with GI bleed requiring PRBCs and was found to have nonbleeding gastric and duodenal ulcers on EGD on 01/10/2018. -his CHADS2VASC score is 3.  5. 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

## 2018-01-20 NOTE — Telephone Encounter (Signed)
Patient is not discharge as of today.

## 2018-01-20 NOTE — Progress Notes (Signed)
[facility] Progress Note  Name: TAYTE MCWHERTER MRN: [id number] DOB: 15-Sep-1940   Subjective:  Seen on HD this Am - no complaints.  RHC showed just mild elevated R pressures, mild to mod pulm HTN, normal CO.  After ~1.5L of UF patient's BP trended down into 90/40s and HR into the 130s.  He was completely asymptomatic.  Ultrafiltration held.  EKG showed HR 130, appears regular, RBBB similar to priors.  When I evaluated him his HR was back into the 90s.    Review of systems:   Reports shortness of breath improving No chest pain Denies n/v   --------------------------- Background on Consult:  HANSFORD HIRT is a 78 y.o. male with a history of HTN and aortic stenosis who presented with GI bleed and hemoglobin 6.5 with PCP and report of black stool.  Found also with fluid overload/anasarca and CHF.  He has been evaluated by cardiology and has received lasix BID; only had 150 mL UOP charted on 12/26.  Work-up thus far notable for renal ultrasound without hydro and with trace abdominal ascites and prostatomegaly.  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  Spoke with patient as well a his wife and daughter.  He reports that his shortness of breath and swelling have worsened since around October of this year.  He reports that he had been on lasix daily for a couple of weeks prior to admission and had just been changed to torsemide when admitted.  He does have some urinary hesitancy and weak stream.  He has followed with urology previously for elevated PSA (which he reports to be around 7) but has not seen nephrology.  He denies NSAID use.  He would want dialysis if indicated.   Work-up has included ANA neg   Intake/Output Summary (Last 24 hours) at 01/20/2018 1029 Last data filed at 01/20/2018 1829 Gross per 24 hour  Intake 240 ml  Output 200 ml  Net 40 ml    Vitals:  Vitals:   01/20/18 0830 01/20/18 0900 01/20/18 0930 01/20/18 1000  BP: (!) 87/43  (!) 89/40 90/62 (!) 99/55  Pulse: (!) 57 (!) 56 (!) 135 (!) 107  Resp:      Temp:      TempSrc:      SpO2:      Weight:      Height:         Physical Exam:  General adult male in bed in no acute distress at rest HEENT normocephalic atraumatic extraocular movements intact sclera anicteric Neck supple trachea midline Lungs clear to auscultation bilaterally normal work of breathing at rest  Heart regular rate and rhythm on my exam  Abdomen soft nontender nondistended Extremities 2-3+ edema bilateral lower extremities Psych normal mood and affect GU foley catheter minimal UOP Neuro alert and oriented x 3; conversant   Medications reviewed   Labs:  BMP Latest Ref Rng & Units 01/20/2018 01/20/2018 01/19/2018  Glucose 70 - 99 mg/dL 121(H) 111(H) 121(H)  BUN 8 - 23 mg/dL 26(H) 62(H) 53(H)  Creatinine 0.61 - 1.24 mg/dL 1.50(H) 3.24(H) 2.51(H)  BUN/Creat Ratio 10 - 24 - - -  Sodium 135 - 145 mmol/L 137 138 140  Potassium 3.5 - 5.1 mmol/L 3.6 3.6 3.5  Chloride 98 - 111 mmol/L 97(L) 102 103  CO2 22 - 32 mmol/L - 27 27  Calcium 8.9 - 10.3 mg/dL - 7.8(L) 7.8(L)    Assessment/Plan:  # AKI on CKD:  Cr in  the 1.3-1.5 range through 11/2016...Marland Kitchennext value is 10/2017 1.7 > 11/1 1.8....12/23 admission 2.6.  CT/renal US with cysts, no obstruction, normal echogenicity.  UP/C is 1490, no hematuria on UA.  Albumin in the 1s-2s.  SPEP and serum free LC normal.  ANA neg.  Liver looks overall ok on CT.    He required initiation of HD 12/30 for worsening azotemia and continued peripheral volume overload not responsive to lasix drip.     Remains oliguric.  Diuretics have been held due to lack of efficacy.  Maintain foley for now to facilitate a 24 hour urine collection as it appears his serum creatinine is an overestimation of his true clearance.  Will plan for UF tomorrow AM and then start 24hr collection - this will need to be ordered tomorrow.  Remains with temporary HD catheter for now.  HD today - has  only tolerated ~1.5L of UF prior to low BP and tachycardia.  Intravascularly ok but still with anasarca.  DUF goal 2.5L tomorrow.  Considering renal biopsy but I think it will be low yield.  Risk may > benefit.    # Acute on chronic diastolic CHF  - diuretics on hold due to lack of efficacy - cardiology following - right heart cath showed no extreme volume overload - prior concern for hemachromatosis  - UF again tomorrow.    #SVT during UF today - EKG with tachycardia, RBBB.  Asymptomatic.  Back to 90s without intervention.  Checked K which was 3.6, using 4K dialysate anyway. CTM.  # Proteinuria - subnephrotic with UP/C 1.5. - SPEP negative and serum free light chains normal.  UPEP does not appear collected - MD spoke with RN, still pending - Note profound hypoalbuminemia - out of proportion to proteinuria alone --> repeat UP/C today - HbA1c normal at 5.0  # Acute hypoxic respiratory failure  - improving with volume removal with HD  - continue supplemental oxygen   # Acute GI bleed - s/p GI eval - s/p PRBC's  # Anemia - secondary to acute blood loss  - Stable/improved and s/p PRBC's   # Aortic stenosis - Noted - Cardiology following   # Prostatomegaly  - Started flomax (new this admission) for prostatomegaly with hesitancy then flomax paused with new confusion/marginal BP per family request - Continue foley for now and reassess  Justin Mend, MD 01/20/2018 10:29 AM

## 2018-01-21 DIAGNOSIS — I480 Paroxysmal atrial fibrillation: Secondary | ICD-10-CM

## 2018-01-21 LAB — COMPREHENSIVE METABOLIC PANEL
ALT: 30 U/L (ref 0–44)
ANION GAP: 9 (ref 5–15)
AST: 24 U/L (ref 15–41)
Albumin: 1.6 g/dL — ABNORMAL LOW (ref 3.5–5.0)
Alkaline Phosphatase: 74 U/L (ref 38–126)
BUN: 41 mg/dL — ABNORMAL HIGH (ref 8–23)
CO2: 27 mmol/L (ref 22–32)
Calcium: 7.6 mg/dL — ABNORMAL LOW (ref 8.9–10.3)
Chloride: 102 mmol/L (ref 98–111)
Creatinine, Ser: 2.74 mg/dL — ABNORMAL HIGH (ref 0.61–1.24)
GFR calc Af Amer: 24 mL/min — ABNORMAL LOW (ref >60)
GFR calc non Af Amer: 21 mL/min — ABNORMAL LOW (ref >60)
Glucose, Bld: 107 mg/dL — ABNORMAL HIGH (ref 70–99)
Potassium: 3.6 mmol/L (ref 3.5–5.1)
Sodium: 138 mmol/L (ref 135–145)
Total Bilirubin: <0.1 mg/dL — ABNORMAL LOW (ref 0.3–1.2)
Total Protein: 4.6 g/dL — ABNORMAL LOW (ref 6.5–8.1)

## 2018-01-21 LAB — CBC
HEMATOCRIT: 27 % — AB (ref 39.0–52.0)
Hemoglobin: 8.3 g/dL — ABNORMAL LOW (ref 13.0–17.0)
MCH: 23.2 pg — ABNORMAL LOW (ref 26.0–34.0)
MCHC: 30.7 g/dL (ref 30.0–36.0)
MCV: 75.6 fL — ABNORMAL LOW (ref 80.0–100.0)
Platelets: 357 10*3/uL (ref 150–400)
RBC: 3.57 MIL/uL — ABNORMAL LOW (ref 4.22–5.81)
RDW: 22 % — ABNORMAL HIGH (ref 11.5–15.5)
WBC: 16.1 10*3/uL — ABNORMAL HIGH (ref 4.0–10.5)
nRBC: 0 % (ref 0.0–0.2)

## 2018-01-21 LAB — PHOSPHORUS: Phosphorus: 2.5 mg/dL (ref 2.5–4.6)

## 2018-01-21 MED ORDER — ALTEPLASE 2 MG IJ SOLR
2.0000 mg | Freq: Once | INTRAMUSCULAR | Status: DC | PRN
  Filled 2018-01-21: qty 2

## 2018-01-21 MED ORDER — SODIUM CHLORIDE 0.9 % IV SOLN: 100.0000 mL | INTRAVENOUS | Status: DC | PRN

## 2018-01-21 MED ORDER — LIDOCAINE-PRILOCAINE 2.5-2.5 % EX CREA
1.0000 "application " | TOPICAL_CREAM | CUTANEOUS | Status: DC | PRN
  Filled 2018-01-21: qty 5

## 2018-01-21 MED ORDER — PENTAFLUOROPROP-TETRAFLUOROETH EX AERO: 1.0000 "application " | INHALATION_SPRAY | CUTANEOUS | Status: DC | PRN

## 2018-01-21 MED ORDER — LIDOCAINE HCL (PF) 1 % IJ SOLN
5.0000 mL | INTRAMUSCULAR | Status: DC | PRN
  Filled 2018-01-21: qty 5

## 2018-01-21 MED ORDER — HEPARIN SODIUM (PORCINE) 1000 UNIT/ML DIALYSIS
1000.0000 [IU] | INTRAMUSCULAR | Status: DC | PRN
  Administered 2018-01-21: 1000 [IU] via INTRAVENOUS_CENTRAL
  Filled 2018-01-21 (×2): qty 1

## 2018-01-21 MED ORDER — HEPARIN SODIUM (PORCINE) 1000 UNIT/ML IJ SOLN
INTRAMUSCULAR | Status: AC
  Administered 2018-01-21: 1000 [IU] via INTRAVENOUS_CENTRAL
  Filled 2018-01-21: qty 4

## 2018-01-21 NOTE — Progress Notes (Signed)
Patient Demographics:    Andrew Guerra, is a 78 y.o. male, DOB - 11/13/1940, OEU:[id number]  Admit date - 01/09/2018   Admitting Physician [name], DO  Outpatient Primary MD for the patient is [name], MD  LOS - 12   Chief Complaint  Patient presents with  . 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 The degree of leftward septal   shift and D shaped septum has worsened. There is now a   trivial-small pericardial effusion. Side by side comparison of   images performed  Brief Summary 78 y.o. male with a PMH of HTN, HLD, bicuspid aortic valve with moderate AS, NSCLC s/p resection and radiation, and PUD admitted from PCPs office on 01/09/2018 with hemoglobin of 6.4, patient with volume overload and anasarca, patient will be initiated on hemodialysis on 01/16/2018 to try to address his volume status, had RHC on 01/19/2018  Plan:- 1)Acute Gi Bleed--- status post EGD on 01/10/2018 with nonbleeding gastric and duodenal ulcers, biopsies are pending, continue PPI twice daily, monitor H&H and transfuse as clinically indicated,  hemoglobin is stable at 9.1  Post-transfusion of 2 units of packed cells this admission  2)HFpEF--acute on chronic diastolic dysfunction CHF exacerbation, last known EF 60 to 65% with moderate aortic stenosis... 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  ANA neg.  Liver looks overall ok on CT, may need renal biopsy  4) acute blood loss anemia--- secondary to #1 above, admission hemoglobin was 6.4, hemoglobin is stable > 9,  post transfusion of 2 units of packed cells,  5) PAFib--- converted back to sinus rhythm on amiodarone  for less than 24 hours, discussed with cardiology service, Dr. 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 Scheduled to see hematology outpatient but not until 03/2018--- be judicious with blood transfusion  7) Moderate aortic stenosis--- most likely contributing to #2 above, repeat echo EF 60 to 65%, degree of stenosis is not worse, patient will need right heart cath after initiation on hemodialysis, had RHC on 01/19/2018 with mild to moderate pulmonary  hypertension and mildly elevated right heart filling pressure with normal cardiac output/index   8)FEN--poor oral intake, weight loss and low albumin----nutritional supplements advised, patient albumin has been less than 2, patient declines appetite stimulant  9)NSCLC - post prior surgery and radiation, patient goes to [facility] every 6 months Looks like patient had this surgically resected, then had recurrence followed by SBRT with shrinking of the recurrent tumor according to oncology office visit notes.  10) transient episode of confusion/delirium/disorientation--- ???  Uremia related, now resolved,, continue to monitor closely.... May be hospital psychosis,   11) leukocytosis-- ??  Reactive in setting of underlying malignancy/inflammation, infection less likely, no fevers, continue to monitor consider further imaging studies of the chest and abdomen if indicated, white count is > 16  Disposition/Need for in-Hospital Stay- patient unable to be discharged at this time due to significant worsening of heart failure, Renal failure and volume overload requiring further diuresis and monitoring (see subjective section above), patient will need additional hemodialysis, still awaiting further clinical and lab data to determine if patient will need long-term hemodialysis in which case --- patient will need outpatient hemodialysis chair/spot prior to discharge----  Code Status : Full  Family Communication:   Wife at bedside  Disposition Plan  : To be determined--nephrology need to determine if patient needs short-term versus long-term hemodialysis  Consults  : Cardiology/GI/Nephrology/IR for HD catheter placement.... Consider hematology consult   DVT Prophylaxis  :  TEDS/ SCDs   Lab Results  Component Value Date   PLT 357 01/21/2018   Inpatient Medications  Scheduled Meds: . carvedilol  3.125 mg Oral BID WC  . Chlorhexidine Gluconate Cloth  6 each Topical Q0600  . feeding supplement (NEPRO CARB  STEADY)  237 mL Oral TID BM  . heparin  5,000 Units Subcutaneous Q8H  . mouth rinse  15 mL Mouth Rinse BID  . mometasone-formoterol  2 puff Inhalation BID  . multivitamin with minerals  1 tablet Oral Daily  . Netarsudil-Latanoprost  1 drop Both Eyes QHS  . pantoprazole  40 mg Oral BID  . sodium chloride flush  3 mL Intravenous Q12H   Continuous Infusions: . sodium chloride     PRN Meds:.sodium chloride, acetaminophen **OR** acetaminophen, heparin, lidocaine, ondansetron **OR** ondansetron (ZOFRAN) IV, sodium chloride flush, zolpidem   Anti-infectives (From admission, onward)   Start     Dose/Rate Route Frequency Ordered Stop   01/16/18 1230  ceFAZolin (ANCEF) IVPB 2g/100 mL premix    Note to Pharmacy:  Pre IR procedure, procedure time 12/30 not yet known   2 g 200 mL/hr over 30 Minutes Intravenous To Surgery 01/15/18 1112 01/17/18 1230        Objective:   Vitals:   01/21/18 1045 01/21/18 1115 01/21/18 1130 01/21/18 1215  BP: 137/74 125/62 (!) 143/62 135/63  Pulse: 78 82 79   Resp:   14   Temp:   97.7 F (36.5 C)   TempSrc:  Oral   SpO2: 98%  98% 94%  Weight:   90.1 kg   Height:        Wt Readings from Last 3 Encounters:  01/21/18 90.1 kg  01/09/18 99.7 kg  12/14/17 88.5 kg    Intake/Output Summary (Last 24 hours) at 01/21/2018 1753 Last data filed at 01/21/2018 1337 Gross per 24 hour  Intake -  Output 3200 ml  Net -3200 ml    Physical Exam Patient is examined daily including today on 01/21/2018  , exams remain the same as of yesterday except that has changed   Gen:- Awake Alert, able to speak in sentences HEENT:- [place].AT, No sclera icterus Neck-Supple Neck, +ve JVD,.  Right-sided HD catheter Lungs- fair air movement bilaterally, no wheezing  CV- S1, S2 normal, irregular (no longer in A. Fib) 3/6 SM Abd-  +ve B.Sounds, Abd Soft, No tenderness,    Extremity/Skin:- improving edema/anasarca extending from the toes into the lower abdomen, much improved edema penis  and scrotum, has TEDs, pedal pulses present  Psych-affect is appropriate, oriented x3  Neuro-generalized weakness, but no new focal deficits, no tremors GU--- much improved scrotal and penile edema, foley per nephrologist  Data Review:   Micro Results Recent Results (from the past 240 hour(s))  Urine Culture     Status: Abnormal   Collection Time: 01/13/18  8:25 AM  Result Value Ref Range Status   Specimen Description URINE, CLEAN CATCH  Final   Special Requests   Final    Normal Performed at [facility], [place] [address], [place], [place] [address]    Culture (A)  Final    >=100,000 COLONIES/mL MULTIPLE SPECIES PRESENT, SUGGEST RECOLLECTION   Report Status 01/14/2018 FINAL  Final  Culture, blood (Routine X 2) w Reflex to ID Panel     Status: None   Collection Time: 01/13/18  8:50 AM  Result Value Ref Range Status   Specimen Description BLOOD BLOOD LEFT HAND  Final   Special Requests   Final    BOTTLES DRAWN AEROBIC AND ANAEROBIC Blood Culture adequate volume   Culture   Final    NO GROWTH 5 DAYS Performed at [facility], [place] [address], [place], [place] [address]    Report Status 01/18/2018 FINAL  Final    Radiology Reports Ct Abdomen Pelvis Wo Contrast  Result Date: 01/09/2018 CLINICAL DATA:  Low hemoglobin. EXAM: CT ABDOMEN AND PELVIS WITHOUT CONTRAST TECHNIQUE: Multidetector CT imaging of the abdomen and pelvis was performed following the standard protocol without IV contrast. COMPARISON:  CT 02/04/2015, abdomen MRI 12/09/2017 FINDINGS: Lower chest: Small bilateral pleural effusions. Cardiomegaly without pericardial effusion or thickening. Atherosclerosis of the included thoracic aorta without aneurysm. Coronary arteriosclerosis is seen. Pulmonary consolidation/atelectasis in the right middle lobe distribution. Hepatobiliary: Layering biliary sludge within the gallbladder without secondary signs of acute cholecystitis. The unenhanced liver demonstrates a granuloma  in the right hepatic lobe. No definite mass or biliary dilatation. Pancreas: Unremarkable. No pancreatic ductal dilatation or surrounding inflammatory changes. Spleen: Normal size spleen with scattered calcifications noted within possibly representing vascular calcifications or granulomata. Adrenals/Urinary Tract: Scattered bilateral hyperdense lesions of the kidneys are nonspecific but may represent proteinaceous or hemorrhagic cysts, the largest is right-sided and interpolar measuring up 1.6 cm with more ill-defined hyperdensity slightly exophytic off the right kidney. No nephrolithiasis or obstructive uropathy. The urinary bladder is unremarkable for the degree of distention. Stomach/Bowel: Stomach is within normal limits. The appendix is not confidently identified. No evidence  of bowel wall thickening, distention, or inflammatory changes. Vascular/Lymphatic: Aortic atherosclerosis. No lymphadenopathy. Reproductive: Prostatomegaly measuring up to 5.7 cm. Other: Diffuse soft tissue anasarca. No ascites or free air. 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 IMPRESSION: 1. Pulmonary consolidation/atelectasis in the right middle lobe with small bilateral pleural effusions. 2. Scattered bilateral hyperdense lesions of the kidneys likely to represent proteinaceous or hemorrhagic cysts, the largest is in the right interpolar and interpolar measuring 1.6 cm with more ill-defined hyperdensity slightly exophytic off the right kidney. Findings better characterized on recent prior MRI. 3. Hepatic and splenic granulomata. 4. Diffuse soft tissue anasarca. 5. 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 Aortic Atherosclerosis (ICD10-I70.0). Electronically Signed   By: Ashley Royalty M.D.   On: 01/09/2018 20:33   Dg Chest 2 View  Result Date: 01/14/2018 CLINICAL DATA:  Shortness of breath EXAM: CHEST - 2 VIEW COMPARISON:  Chest radiograph 01/09/2018 FINDINGS: Monitoring leads overlie the patient. Stable cardiac  and mediastinal contours. Similar-appearing patchy consolidation within the right greater than left lower lungs. Moderate right and small left pleural effusions. No pneumothorax. IMPRESSION: Similar-appearing patchy consolidation within the right greater than left lower lungs with moderate right and small left pleural effusions. Electronically Signed   By: Lovey Newcomer M.D.   On: 01/14/2018 16:08   US Renal  Result Date: 01/13/2018 CLINICAL DATA:  Oliguria.  Elevated PSA. EXAM: RENAL / URINARY TRACT ULTRASOUND COMPLETE COMPARISON:  CT abdomen and pelvis 01/09/2018. FINDINGS: Right Kidney: Renal measurements: 10.5 x 4.6 x 5.0 cm = volume: 126.8 mL . Echogenicity within normal limits. A simple cyst measuring 1.9 cm in diameter is identified. No hydronephrosis visualized. Trace amount of perihepatic ascites is noted. Left Kidney: Renal measurements: 11.4 x 5.5 x 5.1 cm = volume: 163.2 mL. Echogenicity within normal limits. No mass or hydronephrosis visualized. Bladder: The bladder is almost completely decompressed. No focal abnormality. Prostatomegaly is seen. IMPRESSION: Negative for hydronephrosis or acute abnormality. Prostatomegaly. Trace amount of ascites. Electronically Signed   By: Inge Rise M.D.   On: 01/13/2018 11:29   Ir Fluoro Guide Cv Line Right  Result Date: 01/16/2018 INDICATION: 78 year old male with acute renal failure in need of hemodialysis. We had an initial plan for placement of a tunneled hemodialysis catheter, however he has leukocytosis today as well as new onset atrial fibrillation with uncontrolled rapid ventricular response. Therefore, we will place a non tunneled hemodialysis catheter to allow him to get hemodialysis quickly without the additional stress of moderate sedation. We can then convert the non tunneled catheter to a tunneled device once his other issues have been addressed. EXAM: IR RIGHT FLOURO GUIDE CV LINE; IR ULTRASOUND GUIDANCE VASC ACCESS RIGHT MEDICATIONS:  None ANESTHESIA/SEDATION: None FLUOROSCOPY TIME:  Fluoroscopy Time: 0 minutes 12 seconds (1 mGy). COMPLICATIONS: None immediate. PROCEDURE: Informed written consent was obtained from the patient after a thorough discussion of the procedural risks, benefits and alternatives. All questions were addressed. Maximal Sterile Barrier Technique was utilized including caps, mask, sterile gowns, sterile gloves, sterile drape, hand hygiene and skin antiseptic. A timeout was performed prior to the initiation of the procedure. The right internal jugular vein was interrogated with ultrasound and found to be widely patent. An image was obtained and stored for the medical record. Local anesthesia was attained by infiltration with 1% lidocaine. A small dermatotomy was made. Under real-time sonographic guidance, the vessel was punctured with an 18 gauge needle. A 0.035 wire was advanced into the inferior vena cava. The needle was removed. The  skin tract was dilated and a 16 cm triple-lumen hemodialysis catheter was advanced over the wire and positioned with the catheter tip at the superior cavoatrial junction. The catheter was aspirated and then flushed with heparinized saline. The catheter was then secured to the skin with 0 Prolene suture and a sterile bandage. The patient tolerated the procedure well. IMPRESSION: Successful placement of a non tunneled hemodialysis catheter via the right internal jugular vein. The tip of the catheter is at the superior cavoatrial junction and the catheter is ready for immediate use. Electronically Signed   By: Jacqulynn Cadet M.D.   On: 01/16/2018 14:05   Ir US Guide Vasc Access Right  Result Date: 01/16/2018 INDICATION: 78 year old male with acute renal failure in need of hemodialysis. We had an initial plan for placement of a tunneled hemodialysis catheter, however he has leukocytosis today as well as new onset atrial fibrillation with uncontrolled rapid ventricular response. Therefore, we  will place a non tunneled hemodialysis catheter to allow him to get hemodialysis quickly without the additional stress of moderate sedation. We can then convert the non tunneled catheter to a tunneled device once his other issues have been addressed. EXAM: IR RIGHT FLOURO GUIDE CV LINE; IR ULTRASOUND GUIDANCE VASC ACCESS RIGHT MEDICATIONS: None ANESTHESIA/SEDATION: None FLUOROSCOPY TIME:  Fluoroscopy Time: 0 minutes 12 seconds (1 mGy). COMPLICATIONS: None immediate. PROCEDURE: Informed written consent was obtained from the patient after a thorough discussion of the procedural risks, benefits and alternatives. All questions were addressed. Maximal Sterile Barrier Technique was utilized including caps, mask, sterile gowns, sterile gloves, sterile drape, hand hygiene and skin antiseptic. A timeout was performed prior to the initiation of the procedure. The right internal jugular vein was interrogated with ultrasound and found to be widely patent. An image was obtained and stored for the medical record. Local anesthesia was attained by infiltration with 1% lidocaine. A small dermatotomy was made. Under real-time sonographic guidance, the vessel was punctured with an 18 gauge needle. A 0.035 wire was advanced into the inferior vena cava. The needle was removed. The skin tract was dilated and a 16 cm triple-lumen hemodialysis catheter was advanced over the wire and positioned with the catheter tip at the superior cavoatrial junction. The catheter was aspirated and then flushed with heparinized saline. The catheter was then secured to the skin with 0 Prolene suture and a sterile bandage. The patient tolerated the procedure well. IMPRESSION: Successful placement of a non tunneled hemodialysis catheter via the right internal jugular vein. The tip of the catheter is at the superior cavoatrial junction and the catheter is ready for immediate use. Electronically Signed   By: Jacqulynn Cadet M.D.   On: 01/16/2018 14:05   Dg  Chest Portable 1 View  Result Date: 01/09/2018 CLINICAL DATA:  Shortness of breath for 1 day. 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 EXAM: PORTABLE CHEST 1 VIEW COMPARISON:  Radiographs 11/09/2017. Abdominal MRI 12/09/2017. PET-CT 11/25/2015. FINDINGS: 1902 hours. There are right-greater-than-left pleural effusions which have enlarged. There is associated bibasilar pulmonary opacity, likely atelectasis. There are stable postsurgical changes in the right hilar region. No pneumothorax or acute osseous findings are seen. IMPRESSION: Enlarging right-greater-than-left pleural effusions with associated bibasilar pulmonary opacities, likely atelectasis. No invert pulmonary edema. Electronically Signed   By: Richardean Sale M.D.   On: 01/09/2018 19:35     CBC Recent Labs  Lab 01/16/18 0456 01/17/18 0850 01/19/18 0354 01/20/18 0718 01/20/18 1007 01/21/18 0509  WBC 20.1* 18.5* 19.9* 15.8*  --  16.1*  HGB 9.1* 8.3* 9.1* 8.7* 9.5* 8.3*  HCT 28.2* 27.1* 28.9* 28.3* 28.0* 27.0*  PLT 335 297 338 375  --  357  MCV 76.2* 77.2* 75.3* 77.1*  --  75.6*  MCH 24.6* 23.6* 23.7* 23.7*  --  23.2*  MCHC 32.3 30.6 31.5 30.7  --  30.7  RDW 21.6* 22.2* 21.7* 22.2*  --  22.0*    Chemistries  Recent Labs  Lab 01/16/18 0456 01/17/18 0410 01/18/18 0353 01/19/18 0354 01/20/18 0438 01/20/18 1007 01/21/18 0509  NA 137 137 138 140 138 137 138  K 3.6 3.7 3.6 3.5 3.6 3.6 3.6  CL 99 101 102 103 102 97* 102  CO2 26 26 30 27 27   --  27  GLUCOSE 126* 123* 130* 121* 111* 121* 107*  BUN 107* 92* 68* 53* 62* 26* 41*  CREATININE 2.84* 2.58* 2.42* 2.51* 3.24* 1.50* 2.74*  CALCIUM 8.0* 8.1* 7.9* 7.8* 7.8*  --  7.6*  AST 26  --   --   --   --   --  24  ALT 36  --   --   --   --   --  30  ALKPHOS 78  --   --   --   --   --  74  BILITOT 0.4  --   --   --   --   --  <0.1*    Lab Results  Component Value Date   HGBA1C 5.0 01/14/2018    -----------------------------------------------------------------------------------------------------------------    Component Value Date/Time   BNP 598.0 (H) 01/09/2018 1205    Delcenia Inman M.D on 01/21/2018 at 5:53 PM  Pager---[phone] Go to [email or web address] - password [email or web address] for contact info  [facility] - Office  [phone]

## 2018-01-21 NOTE — Progress Notes (Addendum)
Progress Note  Patient Name: Andrew Guerra Date of Encounter: 01/21/2018  Primary Cardiologist: Fransico Him, MD   Subjective   Sitting in chair, wife present.  Feeling comfortable.  No chest pain.  Less short of breath.  Inpatient Medications    Scheduled Meds: . carvedilol  3.125 mg Oral BID WC  . Chlorhexidine Gluconate Cloth  6 each Topical Q0600  . feeding supplement (NEPRO CARB STEADY)  237 mL Oral TID BM  . heparin  5,000 Units Subcutaneous Q8H  . mouth rinse  15 mL Mouth Rinse BID  . mometasone-formoterol  2 puff Inhalation BID  . multivitamin with minerals  1 tablet Oral Daily  . Netarsudil-Latanoprost  1 drop Both Eyes QHS  . pantoprazole  40 mg Oral BID  . sodium chloride flush  3 mL Intravenous Q12H   Continuous Infusions: . sodium chloride    . amiodarone 30 mg/hr (01/21/18 1244)   PRN Meds: sodium chloride, acetaminophen **OR** acetaminophen, heparin, lidocaine, ondansetron **OR** ondansetron (ZOFRAN) IV, sodium chloride flush, zolpidem   Vital Signs    Vitals:   01/21/18 1045 01/21/18 1115 01/21/18 1130 01/21/18 1215  BP: 137/74 125/62 (!) 143/62 135/63  Pulse: 78 82 79   Resp:   14   Temp:   97.7 F (36.5 C)   TempSrc:   Oral   SpO2: 98%  98% 94%  Weight:   90.1 kg   Height:        Intake/Output Summary (Last 24 hours) at 01/21/2018 1342 Last data filed at 01/21/2018 1337 Gross per 24 hour  Intake 2401.49 ml  Output 3200 ml  Net -798.51 ml   Filed Weights   01/21/18 0500 01/21/18 0825 01/21/18 1130  Weight: 92.3 kg 93.2 kg 90.1 kg    Telemetry    Sinus rhythm with ventricular trigeminy, atrial fibrillation has converted- Personally Reviewed  ECG    Right bundle branch block, atrial fibrillation noon 01/20/2018- Personally Reviewed  Physical Exam   GEN: No acute distress.   Neck: No JVD Cardiac:  Regular rate and rhythm with ventricular trigeminy causing ectopic pattern, systolic murmur, no rubs, or gallops.  Respiratory: Clear to  auscultation bilaterally. GI: Soft, nontender, non-distended  MS: No edema; No deformity. Neuro:  Nonfocal  Psych: Normal affect   Labs    Chemistry Recent Labs  Lab 01/16/18 0456  01/19/18 0354 01/20/18 0438 01/20/18 1007 01/21/18 0509  NA 137   < > 140 138 137 138  K 3.6   < > 3.5 3.6 3.6 3.6  CL 99   < > 103 102 97* 102  CO2 26   < > 27 27  --  27  GLUCOSE 126*   < > 121* 111* 121* 107*  BUN 107*   < > 53* 62* 26* 41*  CREATININE 2.84*   < > 2.51* 3.24* 1.50* 2.74*  CALCIUM 8.0*   < > 7.8* 7.8*  --  7.6*  PROT 4.7*  --   --   --   --  4.6*  ALBUMIN 1.9*   < > 1.7* 1.6*  --  1.6*  AST 26  --   --   --   --  24  ALT 36  --   --   --   --  30  ALKPHOS 78  --   --   --   --  74  BILITOT 0.4  --   --   --   --  <0.1*  GFRNONAA 20*   < > 23* 17*  --  21*  GFRAA 23*   < > 27* 20*  --  24*  ANIONGAP 12   < > 10 9  --  9   < > = values in this interval not displayed.     Hematology Recent Labs  Lab 01/19/18 0354 01/20/18 0718 01/20/18 1007 01/21/18 0509  WBC 19.9* 15.8*  --  16.1*  RBC 3.84* 3.67*  --  3.57*  HGB 9.1* 8.7* 9.5* 8.3*  HCT 28.9* 28.3* 28.0* 27.0*  MCV 75.3* 77.1*  --  75.6*  MCH 23.7* 23.7*  --  23.2*  MCHC 31.5 30.7  --  30.7  RDW 21.7* 22.2*  --  22.0*  PLT 338 375  --  357    Cardiac EnzymesNo results for input(s): TROPONINI in the last 168 hours. No results for input(s): TROPIPOC in the last 168 hours.   BNPNo results for input(s): BNP, PROBNP in the last 168 hours.   DDimer No results for input(s): DDIMER in the last 168 hours.   Radiology    No results found.  Cardiac Studies   Echocardiogram 01/13/2018 -EF 65%.  D-shaped septum.  Patient Profile     78 y.o. male with a PMH of HTN, HLD, bicuspid aortic valve with moderate AS, NSCLC s/p resection and radiation, and PUD,who is being seen today for the evaluation of CHFat the request of Dr. Denton Brick with new onset atrial fibrillation.  Assessment & Plan    New-onset atrial  fibrillation with rapid ventricular response - Blood pressure recently to soft for beta-blocker calcium channel blocker and carvedilol was decreased early on because of soft blood pressure. - IV amiodarone was started yesterday afternoon.  He converted, currently in sinus rhythm with ventricular trigeminy. - CHADSVASc 3 - I think at this time since he converted so quickly, I would try to avoid anticoagulation especially given his recent GI bleed.  Of course, if he returns to atrial fibrillation, he would be at increased risk for stroke. -I think it be fine to keep him off of the amiodarone at this point.  Obviously if this returns, we can always consider p.o. loading once again.  He lost his IV.  Acute diastolic heart failure -EF 65% but severe RV dilatation and moderate RV dysfunction was noted. -Right heart cath showed normal left-sided filling pressures but mildly elevated right-sided filling pressures as well as mild pulmonary hypertension.  Cardiac output was normal. -Continue with current medical management with dialysis for volume management.  End-stage renal disease on hemodialysis - HD catheter placed.  GI bleed - Hemoglobin 6.4 up to 9.5 after 2 units.  Nonbleeding gastric ulcer seen on EGD.  Mild to moderate aortic stenosis bioprosthetic aortic valve - Echo 01/13/2018 mean gradient 22.  Stable.  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  Hematology appointment upcoming.      For questions or updates, please contact [place] Please consult [email or web address] for contact info under        Signed, Candee Furbish, MD  01/21/2018, 1:42 PM

## 2018-01-21 NOTE — Progress Notes (Signed)
RN noted pt's HR to be between 42-49 bpm.  On call  MD and Cardiology contacted. Cardiologist ordered to stop Amiodarone drip due to low HR and hold any other medications that may decrease pt's HR. Pt currently asymptomatic, showing no signs of distress, drip stopped per MD's ordered. RN to continue to monitor.

## 2018-01-21 NOTE — Progress Notes (Signed)
[facility] Progress Note  Name: Andrew Guerra MRN: [id number] DOB: 1940/05/23   Subjective:  Seen on HD this AM - no complaints except having some confusion at night in hospital.   RHC showed just mild elevated R pressures, mild to mod pulm HTN, normal CO.   Review of systems:   Reports shortness of breath improving No chest pain Denies n/v   --------------------------- Background on Consult:  Andrew Guerra is a 78 y.o. male with a history of HTN and aortic stenosis who presented with GI bleed and hemoglobin 6.5 with PCP and report of black stool.  Found also with fluid overload/anasarca and CHF.  He has been evaluated by cardiology and has received lasix BID; only had 150 mL UOP charted on 12/26.  Work-up thus far notable for renal ultrasound without hydro and with trace abdominal ascites and prostatomegaly.  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  Spoke with patient as well a his wife and daughter.  He reports that his shortness of breath and swelling have worsened since around October of this year.  He reports that he had been on lasix daily for a couple of weeks prior to admission and had just been changed to torsemide when admitted.  He does have some urinary hesitancy and weak stream.  He has followed with urology previously for elevated PSA (which he reports to be around 7) but has not seen nephrology.  He denies NSAID use.  He would want dialysis if indicated.   Work-up has included ANA neg   Intake/Output Summary (Last 24 hours) at 01/21/2018 1129 Last data filed at 01/20/2018 1802 Gross per 24 hour  Intake 2401.49 ml  Output 100 ml  Net 2301.49 ml    Vitals:  Vitals:   01/21/18 0915 01/21/18 0945 01/21/18 1015 01/21/18 1045  BP: 125/65 (!) 132/56 126/62 (P) 137/74  Pulse: 78 82 79 (P) 78  Resp: 13 16    Temp:      TempSrc:      SpO2: 100%   (P) 98%  Weight:      Height:         Physical Exam:  General adult male in  bed in no acute distress at rest HEENT normocephalic atraumatic extraocular movements intact sclera anicteric Neck supple trachea midline Lungs clear to auscultation bilaterally normal work of breathing at rest  Heart regular rate and rhythm on my exam  Abdomen soft nontender nondistended Extremities 2-3+ edema bilateral lower extremities really unchanged Psych normal mood and affect GU foley catheter minimal UOP Neuro alert and oriented x 3; conversant   Medications reviewed   Labs:  BMP Latest Ref Rng & Units 01/21/2018 01/20/2018 01/20/2018  Glucose 70 - 99 mg/dL 107(H) 121(H) 111(H)  BUN 8 - 23 mg/dL 41(H) 26(H) 62(H)  Creatinine 0.61 - 1.24 mg/dL 2.74(H) 1.50(H) 3.24(H)  BUN/Creat Ratio 10 - 24 - - -  Sodium 135 - 145 mmol/L 138 137 138  Potassium 3.5 - 5.1 mmol/L 3.6 3.6 3.6  Chloride 98 - 111 mmol/L 102 97(L) 102  CO2 22 - 32 mmol/L 27 - 27  Calcium 8.9 - 10.3 mg/dL 7.6(L) - 7.8(L)    Assessment/Plan:  # AKI on CKD:  Cr in the 1.3-1.5 range through 11/2016...Marland Kitchennext value is 10/2017 1.7 > 11/1 1.8....12/23 admission 2.6.  CT/renal US with cysts, no obstruction, normal echogenicity.  UP/C is 1490, no hematuria on UA.  Albumin in the 1s-2s.  SPEP and serum  free LC normal.  ANA neg.  Liver looks overall ok on CT.  10/2017 TTE RV not commented upon, 12/2017 RV severely dilated  He required initiation of HD 12/30 for worsening azotemia and continued peripheral volume overload not responsive to lasix drip.     Remains oliguric.  Diuretics have been held due to lack of efficacy.  Maintain foley for now to facilitate a 24 hour urine collection as it appears his serum creatinine is an overestimation of his true clearance.  UF this AM and then start 24hr collection.  Remains with temporary HD catheter for now but likely will need tunneled catheter and outpatient dialysis arranged.  I think the 24h urine collection will help with that decision.  Intravascularly ok per RHC this week but still  with anasarca.  DUF goal 2.5L today.  I think renal biopsy would be low yield and do not plan to pursue.  I suspect this is cardiorenal syndrome.  Reordered UP/C and if nephrotic range would reconsider.      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   - diuretics on hold due to lack of efficacy - cardiology following - right heart cath showed no extreme volume overload - prior concern for hemachromatosis  - UF today  #SVT during UF yesterday- EKG with tachycardia, RBBB.  Asymptomatic.  Back to 90s without intervention.  Checked K which was 3.6. Tele with A fib, rate controlled now.    # Proteinuria - subnephrotic with UP/C 1.5. - SPEP negative and serum free light chains normal.   - Note profound hypoalbuminemia - out of proportion to proteinuria alone --> repeat UP/C  - HbA1c normal at 5.0  # Acute hypoxic respiratory failure  - improving with volume removal with HD  - continue supplemental oxygen   # Acute GI bleed - s/p GI eval - s/p PRBC's  # Anemia - secondary to acute blood loss  - Stable/improved and s/p PRBC's   # Aortic stenosis - Noted - Cardiology following   # Prostatomegaly  - Started flomax (new this admission) for prostatomegaly with hesitancy then flomax paused with new confusion/marginal BP per family request - Continue foley for now and reassess  Andrew Mend, MD 01/21/2018 11:29 AM

## 2018-01-22 LAB — RENAL FUNCTION PANEL
Albumin: 1.7 g/dL — ABNORMAL LOW (ref 3.5–5.0)
Anion gap: 9 (ref 5–15)
BUN: 56 mg/dL — AB (ref 8–23)
CO2: 25 mmol/L (ref 22–32)
Calcium: 7.7 mg/dL — ABNORMAL LOW (ref 8.9–10.3)
Chloride: 102 mmol/L (ref 98–111)
Creatinine, Ser: 3.36 mg/dL — ABNORMAL HIGH (ref 0.61–1.24)
GFR calc Af Amer: 19 mL/min — ABNORMAL LOW (ref >60)
GFR calc non Af Amer: 16 mL/min — ABNORMAL LOW (ref >60)
Glucose, Bld: 110 mg/dL — ABNORMAL HIGH (ref 70–99)
Phosphorus: 2.7 mg/dL (ref 2.5–4.6)
Potassium: 3.8 mmol/L (ref 3.5–5.1)
Sodium: 136 mmol/L (ref 135–145)

## 2018-01-22 MED ORDER — AMIODARONE HCL 200 MG PO TABS
400.0000 mg | ORAL_TABLET | Freq: Two times a day (BID) | ORAL | Status: DC
  Administered 2018-01-22 – 2018-01-29 (×15): 400 mg via ORAL
  Filled 2018-01-22 (×15): qty 2

## 2018-01-22 MED ORDER — METOPROLOL TARTRATE 5 MG/5ML IV SOLN
5.0000 mg | INTRAVENOUS | Status: AC | PRN
  Administered 2018-01-22 (×2): 5 mg via INTRAVENOUS
  Filled 2018-01-22 (×2): qty 5

## 2018-01-22 MED ORDER — SODIUM CHLORIDE 0.9% FLUSH
10.0000 mL | INTRAVENOUS | Status: DC | PRN
  Administered 2018-01-22 – 2018-01-25 (×2): 10 mL
  Filled 2018-01-22 (×2): qty 40

## 2018-01-22 NOTE — Progress Notes (Signed)
Patient Demographics:    Andrew Guerra, is a 78 y.o. male, DOB - 07/19/1940, WSF:[id number]  Admit date - 01/09/2018   Admitting Physician [name], DO  Outpatient Primary MD for the patient is Andrew Drown, MD  LOS - 89   Chief Complaint  Patient presents with  . 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 The degree of leftward septal   shift and D shaped septum has worsened. There is now a   trivial-small pericardial effusion. Side by side comparison of   images performed  Brief Summary 78 y.o. male with a PMH of HTN, HLD, bicuspid aortic valve with moderate AS, NSCLC s/p resection and radiation, and PUD admitted from PCPs office on 01/09/2018 with hemoglobin of 6.4, patient with volume overload and anasarca, patient will be initiated on hemodialysis on 01/16/2018 to try to address his volume status, had RHC on 01/19/2018  Plan:- 1)Acute Gi Bleed--- status post EGD on 01/10/2018 with nonbleeding gastric and duodenal ulcers, biopsies are pending, continue PPI twice daily, monitor H&H and transfuse as clinically indicated, hemoglobin is stable at 9.1  Post-transfusion of 2 units of  packed cells this admission  2)HFpEF--acute on chronic diastolic dysfunction CHF exacerbation, last known EF 60 to 65% with moderate aortic stenosis... 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  ANA neg.  Liver looks overall ok on CT, may need renal biopsy  4) acute blood loss anemia--- secondary to #1 above, admission hemoglobin was 6.4, hemoglobin is stable > 9,  post transfusion of 2 units of packed cells,  5) PAFib--- patient had another episode of tachycardia, cardiology/restarted him on oral amiodarone,   discussed with cardiology  service, Dr. 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  10) transient episode of confusion/delirium/disorientation--- ???  Uremia related, now resolved,, continue to monitor closely.... May be hospital psychosis,   11) leukocytosis-- ??  Reactive in setting of underlying malignancy/inflammation, infection less likely, no fevers, continue to monitor consider further imaging studies of the chest and abdomen if indicated, white count is > 16  Disposition/Need for in-Hospital Stay- patient unable to be discharged at this time due to significant worsening of heart failure, Renal failure and volume overload requiring further diuresis and monitoring (see subjective section above), patient will need additional hemodialysis, still awaiting further clinical and lab data to determine if patient will need long-term hemodialysis in which case --- patient will need outpatient hemodialysis chair/spot prior to discharge----  Code Status : Full  Family Communication:   Wife at bedside  Disposition Plan  : To be determined--nephrology need to determine if patient needs short-term versus long-term hemodialysis  Consults  : Cardiology/GI/Nephrology/IR for HD catheter placement.... Consider hematology consult   DVT Prophylaxis  :  TEDS/ SCDs   Lab Results  Component Value Date   PLT 357 01/21/2018   Inpatient Medications  Scheduled Meds: . amiodarone  400 mg Oral BID  . carvedilol  3.125 mg Oral BID WC  . Chlorhexidine Gluconate Cloth  6 each Topical Q0600  . feeding supplement (NEPRO CARB STEADY)  237 mL Oral TID BM  . heparin   5,000 Units Subcutaneous Q8H  . mouth rinse  15 mL Mouth Rinse BID  . mometasone-formoterol  2 puff Inhalation BID  . multivitamin with minerals  1 tablet Oral Daily  . Netarsudil-Latanoprost  1 drop Both Eyes QHS  . pantoprazole  40 mg Oral BID  . sodium chloride flush  3 mL Intravenous Q12H   Continuous Infusions: . sodium chloride     PRN Meds:.sodium chloride, acetaminophen **OR** acetaminophen, heparin, lidocaine, ondansetron **OR** ondansetron (ZOFRAN) IV, sodium chloride flush, sodium chloride flush, zolpidem   Anti-infectives (From admission, onward)   Start     Dose/Rate Route Frequency Ordered Stop   01/16/18 1230  ceFAZolin (ANCEF) IVPB 2g/100 mL premix    Note to Pharmacy:  Pre IR procedure, procedure time 12/30 not yet known   2 g 200 mL/hr over 30 Minutes Intravenous To Surgery 01/15/18 1112 01/17/18 1230        Objective:   Vitals:   01/22/18 0554 01/22/18 0852 01/22/18 1019 01/22/18 1624  BP: 115/87 119/67  114/69  Pulse: (!) 127 80 79 78  Resp:   18   Temp:      TempSrc:  SpO2:   94%   Weight:      Height:        Wt Readings from Last 3 Encounters:  01/22/18 90.9 kg  01/09/18 99.7 kg  12/14/17 88.5 kg    Intake/Output Summary (Last 24 hours) at 01/22/2018 1956 Last data filed at 01/22/2018 0558 Gross per 24 hour  Intake -  Output 125 ml  Net -125 ml    Physical Exam Patient is examined daily including today on 01/22/2018  , exams remain the same as of yesterday except that has changed   Gen:- Awake Alert, able to speak in sentences HEENT:- [place].AT, No sclera icterus Neck-Supple Neck, +ve JVD,.  Right-sided HD catheter Lungs- fair air movement bilaterally, no wheezing  CV- S1, S2 normal, irregular (no longer in A. Fib) 3/6 SM Abd-  +ve B.Sounds, Abd Soft, No tenderness,    Extremity/Skin:- improving edema/anasarca extending from the toes into the lower abdomen, much improved edema penis and scrotum, has TEDs, pedal pulses present    Psych-affect is appropriate, oriented x3  Neuro-generalized weakness, but no new focal deficits, no tremors GU--- much improved scrotal and penile edema, foley per nephrologist  Data Review:   Micro Results Recent Results (from the past 240 hour(s))  Urine Culture     Status: Abnormal   Collection Time: 01/13/18  8:25 AM  Result Value Ref Range Status   Specimen Description URINE, CLEAN CATCH  Final   Special Requests   Final    Normal Performed at [facility], [place] [address] [place], [place] [address]    Culture (A)  Final    >=100,000 COLONIES/mL MULTIPLE SPECIES PRESENT, SUGGEST RECOLLECTION   Report Status 01/14/2018 FINAL  Final  Culture, blood (Routine X 2) w Reflex to ID Panel     Status: None   Collection Time: 01/13/18  8:50 AM  Result Value Ref Range Status   Specimen Description BLOOD BLOOD LEFT HAND  Final   Special Requests   Final    BOTTLES DRAWN AEROBIC AND ANAEROBIC Blood Culture adequate volume   Culture   Final    NO GROWTH 5 DAYS Performed at [facility], [place] [address] [place], [place] [address]    Report Status 01/18/2018 FINAL  Final    Radiology Reports Ct Abdomen Pelvis Wo Contrast  Result Date: 01/09/2018 CLINICAL DATA:  Low hemoglobin. EXAM: CT ABDOMEN AND PELVIS WITHOUT CONTRAST TECHNIQUE: Multidetector CT imaging of the abdomen and pelvis was performed following the standard protocol without IV contrast. COMPARISON:  CT 02/04/2015, abdomen MRI 12/09/2017 FINDINGS: Lower chest: Small bilateral pleural effusions. Cardiomegaly without pericardial effusion or thickening. Atherosclerosis of the included thoracic aorta without aneurysm. Coronary arteriosclerosis is seen. Pulmonary consolidation/atelectasis in the right middle lobe distribution. Hepatobiliary: Layering biliary sludge within the gallbladder without secondary signs of acute cholecystitis. The unenhanced liver demonstrates a granuloma in the right hepatic lobe. No definite mass  or biliary dilatation. Pancreas: Unremarkable. No pancreatic ductal dilatation or surrounding inflammatory changes. Spleen: Normal size spleen with scattered calcifications noted within possibly representing vascular calcifications or granulomata. Adrenals/Urinary Tract: Scattered bilateral hyperdense lesions of the kidneys are nonspecific but may represent proteinaceous or hemorrhagic cysts, the largest is right-sided and interpolar measuring up 1.6 cm with more ill-defined hyperdensity slightly exophytic off the right kidney. No nephrolithiasis or obstructive uropathy. The urinary bladder is unremarkable for the degree of distention. Stomach/Bowel: Stomach is within normal limits. The appendix is not confidently identified. No evidence of bowel wall  thickening, distention, or inflammatory changes. Vascular/Lymphatic: Aortic atherosclerosis. No lymphadenopathy. Reproductive: Prostatomegaly measuring up to 5.7 cm. Other: Diffuse soft tissue anasarca. No ascites or free air. 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 IMPRESSION: 1. Pulmonary consolidation/atelectasis in the right middle lobe with small bilateral pleural effusions. 2. Scattered bilateral hyperdense lesions of the kidneys likely to represent proteinaceous or hemorrhagic cysts, the largest is in the right interpolar and interpolar measuring 1.6 cm with more ill-defined hyperdensity slightly exophytic off the right kidney. Findings better characterized on recent prior MRI. 3. Hepatic and splenic granulomata. 4. Diffuse soft tissue anasarca. 5. 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 Aortic Atherosclerosis (ICD10-I70.0). Electronically Signed   By: Ashley Royalty M.D.   On: 01/09/2018 20:33   Dg Chest 2 View  Result Date: 01/14/2018 CLINICAL DATA:  Shortness of breath EXAM: CHEST - 2 VIEW COMPARISON:  Chest radiograph 01/09/2018 FINDINGS: Monitoring leads overlie the patient. Stable cardiac and mediastinal contours. Similar-appearing  patchy consolidation within the right greater than left lower lungs. Moderate right and small left pleural effusions. No pneumothorax. IMPRESSION: Similar-appearing patchy consolidation within the right greater than left lower lungs with moderate right and small left pleural effusions. Electronically Signed   By: Lovey Newcomer M.D.   On: 01/14/2018 16:08   US Renal  Result Date: 01/13/2018 CLINICAL DATA:  Oliguria.  Elevated PSA. EXAM: RENAL / URINARY TRACT ULTRASOUND COMPLETE COMPARISON:  CT abdomen and pelvis 01/09/2018. FINDINGS: Right Kidney: Renal measurements: 10.5 x 4.6 x 5.0 cm = volume: 126.8 mL . Echogenicity within normal limits. A simple cyst measuring 1.9 cm in diameter is identified. No hydronephrosis visualized. Trace amount of perihepatic ascites is noted. Left Kidney: Renal measurements: 11.4 x 5.5 x 5.1 cm = volume: 163.2 mL. Echogenicity within normal limits. No mass or hydronephrosis visualized. Bladder: The bladder is almost completely decompressed. No focal abnormality. Prostatomegaly is seen. IMPRESSION: Negative for hydronephrosis or acute abnormality. Prostatomegaly. Trace amount of ascites. Electronically Signed   By: Inge Rise M.D.   On: 01/13/2018 11:29   Ir Fluoro Guide Cv Line Right  Result Date: 01/16/2018 INDICATION: 78 year old male with acute renal failure in need of hemodialysis. We had an initial plan for placement of a tunneled hemodialysis catheter, however he has leukocytosis today as well as new onset atrial fibrillation with uncontrolled rapid ventricular response. Therefore, we will place a non tunneled hemodialysis catheter to allow him to get hemodialysis quickly without the additional stress of moderate sedation. We can then convert the non tunneled catheter to a tunneled device once his other issues have been addressed. EXAM: IR RIGHT FLOURO GUIDE CV LINE; IR ULTRASOUND GUIDANCE VASC ACCESS RIGHT MEDICATIONS: None ANESTHESIA/SEDATION: None FLUOROSCOPY  TIME:  Fluoroscopy Time: 0 minutes 12 seconds (1 mGy). COMPLICATIONS: None immediate. PROCEDURE: Informed written consent was obtained from the patient after a thorough discussion of the procedural risks, benefits and alternatives. All questions were addressed. Maximal Sterile Barrier Technique was utilized including caps, mask, sterile gowns, sterile gloves, sterile drape, hand hygiene and skin antiseptic. A timeout was performed prior to the initiation of the procedure. The right internal jugular vein was interrogated with ultrasound and found to be widely patent. An image was obtained and stored for the medical record. Local anesthesia was attained by infiltration with 1% lidocaine. A small dermatotomy was made. Under real-time sonographic guidance, the vessel was punctured with an 18 gauge needle. A 0.035 wire was advanced into the inferior vena cava. The needle was removed. The skin tract was  dilated and a 16 cm triple-lumen hemodialysis catheter was advanced over the wire and positioned with the catheter tip at the superior cavoatrial junction. The catheter was aspirated and then flushed with heparinized saline. The catheter was then secured to the skin with 0 Prolene suture and a sterile bandage. The patient tolerated the procedure well. IMPRESSION: Successful placement of a non tunneled hemodialysis catheter via the right internal jugular vein. The tip of the catheter is at the superior cavoatrial junction and the catheter is ready for immediate use. Electronically Signed   By: Jacqulynn Cadet M.D.   On: 01/16/2018 14:05   Ir US Guide Vasc Access Right  Result Date: 01/16/2018 INDICATION: 78 year old male with acute renal failure in need of hemodialysis. We had an initial plan for placement of a tunneled hemodialysis catheter, however he has leukocytosis today as well as new onset atrial fibrillation with uncontrolled rapid ventricular response. Therefore, we will place a non tunneled hemodialysis  catheter to allow him to get hemodialysis quickly without the additional stress of moderate sedation. We can then convert the non tunneled catheter to a tunneled device once his other issues have been addressed. EXAM: IR RIGHT FLOURO GUIDE CV LINE; IR ULTRASOUND GUIDANCE VASC ACCESS RIGHT MEDICATIONS: None ANESTHESIA/SEDATION: None FLUOROSCOPY TIME:  Fluoroscopy Time: 0 minutes 12 seconds (1 mGy). COMPLICATIONS: None immediate. PROCEDURE: Informed written consent was obtained from the patient after a thorough discussion of the procedural risks, benefits and alternatives. All questions were addressed. Maximal Sterile Barrier Technique was utilized including caps, mask, sterile gowns, sterile gloves, sterile drape, hand hygiene and skin antiseptic. A timeout was performed prior to the initiation of the procedure. The right internal jugular vein was interrogated with ultrasound and found to be widely patent. An image was obtained and stored for the medical record. Local anesthesia was attained by infiltration with 1% lidocaine. A small dermatotomy was made. Under real-time sonographic guidance, the vessel was punctured with an 18 gauge needle. A 0.035 wire was advanced into the inferior vena cava. The needle was removed. The skin tract was dilated and a 16 cm triple-lumen hemodialysis catheter was advanced over the wire and positioned with the catheter tip at the superior cavoatrial junction. The catheter was aspirated and then flushed with heparinized saline. The catheter was then secured to the skin with 0 Prolene suture and a sterile bandage. The patient tolerated the procedure well. IMPRESSION: Successful placement of a non tunneled hemodialysis catheter via the right internal jugular vein. The tip of the catheter is at the superior cavoatrial junction and the catheter is ready for immediate use. Electronically Signed   By: Jacqulynn Cadet M.D.   On: 01/16/2018 14:05   Dg Chest Portable 1 View  Result Date:  01/09/2018 CLINICAL DATA:  Shortness of breath for 1 day. 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 EXAM: PORTABLE CHEST 1 VIEW COMPARISON:  Radiographs 11/09/2017. Abdominal MRI 12/09/2017. PET-CT 11/25/2015. FINDINGS: 1902 hours. There are right-greater-than-left pleural effusions which have enlarged. There is associated bibasilar pulmonary opacity, likely atelectasis. There are stable postsurgical changes in the right hilar region. No pneumothorax or acute osseous findings are seen. IMPRESSION: Enlarging right-greater-than-left pleural effusions with associated bibasilar pulmonary opacities, likely atelectasis. No invert pulmonary edema. Electronically Signed   By: Richardean Sale M.D.   On: 01/09/2018 19:35     CBC Recent Labs  Lab 01/16/18 0456 01/17/18 0850 01/19/18 0354 01/20/18 0718 01/20/18 1007 01/21/18 0509  WBC 20.1* 18.5* 19.9* 15.8*  --  16.1*  HGB 9.1* 8.3* 9.1* 8.7* 9.5* 8.3*  HCT 28.2* 27.1* 28.9* 28.3* 28.0* 27.0*  PLT 335 297 338 375  --  357  MCV 76.2* 77.2* 75.3* 77.1*  --  75.6*  MCH 24.6* 23.6* 23.7* 23.7*  --  23.2*  MCHC 32.3 30.6 31.5 30.7  --  30.7  RDW 21.6* 22.2* 21.7* 22.2*  --  22.0*    Chemistries  Recent Labs  Lab 01/16/18 0456  01/18/18 0353 01/19/18 0354 01/20/18 0438 01/20/18 1007 01/21/18 0509 01/22/18 0328  NA 137   < > 138 140 138 137 138 136  K 3.6   < > 3.6 3.5 3.6 3.6 3.6 3.8  CL 99   < > 102 103 102 97* 102 102  CO2 26   < > 30 27 27   --  27 25  GLUCOSE 126*   < > 130* 121* 111* 121* 107* 110*  BUN 107*   < > 68* 53* 62* 26* 41* 56*  CREATININE 2.84*   < > 2.42* 2.51* 3.24* 1.50* 2.74* 3.36*  CALCIUM 8.0*   < > 7.9* 7.8* 7.8*  --  7.6* 7.7*  AST 26  --   --   --   --   --  24  --   ALT 36  --   --   --   --   --  30  --   ALKPHOS 78  --   --   --   --   --  74  --   BILITOT 0.4  --   --   --   --   --  <0.1*  --    < > = values in this interval not displayed.    Lab Results  Component Value Date   HGBA1C 5.0  01/14/2018   -----------------------------------------------------------------------------------------------------------------    Component Value Date/Time   BNP 598.0 (H) 01/09/2018 1205    Jashawn Floyd M.D on 01/22/2018 at 7:56 PM  Pager---[phone] Go to [email or web address] - password [email or web address] for contact info  [facility] - Office  [phone]

## 2018-01-22 NOTE — Progress Notes (Signed)
Progress Note  Patient Name: Andrew Guerra Date of Encounter: 01/22/2018  Primary Cardiologist: Fransico Him, MD   Subjective   Him again in his chair, wife in room.  Overall feels well, no chest pain no shortness of breath  Inpatient Medications    Scheduled Meds: . amiodarone  400 mg Oral BID  . carvedilol  3.125 mg Oral BID WC  . Chlorhexidine Gluconate Cloth  6 each Topical Q0600  . feeding supplement (NEPRO CARB STEADY)  237 mL Oral TID BM  . heparin  5,000 Units Subcutaneous Q8H  . mouth rinse  15 mL Mouth Rinse BID  . mometasone-formoterol  2 puff Inhalation BID  . multivitamin with minerals  1 tablet Oral Daily  . Netarsudil-Latanoprost  1 drop Both Eyes QHS  . pantoprazole  40 mg Oral BID  . sodium chloride flush  3 mL Intravenous Q12H   Continuous Infusions: . sodium chloride     PRN Meds: sodium chloride, acetaminophen **OR** acetaminophen, heparin, lidocaine, ondansetron **OR** ondansetron (ZOFRAN) IV, sodium chloride flush, sodium chloride flush, zolpidem   Vital Signs    Vitals:   01/22/18 0528 01/22/18 0554 01/22/18 0852 01/22/18 1019  BP: (!) 89/65 115/87 119/67   Pulse: (!) 126 (!) 127 80 79  Resp:    18  Temp: 98.7 F (37.1 C)     TempSrc: Oral     SpO2: 95%   94%  Weight:      Height:        Intake/Output Summary (Last 24 hours) at 01/22/2018 1347 Last data filed at 01/22/2018 0558 Gross per 24 hour  Intake -  Output 125 ml  Net -125 ml   Filed Weights   01/21/18 0825 01/21/18 1130 01/22/18 0500  Weight: 93.2 kg 90.1 kg 90.9 kg    Telemetry    Previous atrial fibrillation converted back to sinus rhythm- Personally Reviewed  ECG    Right bundle branch block, atrial fibrillation noon 01/20/2018- Personally Reviewed  Physical Exam   GEN: Well nourished, well developed, in no acute distress  HEENT: normal  Neck: no JVD, carotid bruits, or masses Cardiac: Regular rate and rhythm; no murmurs, rubs, or gallops,no edema  Respiratory:   clear to auscultation bilaterally, normal work of breathing GI: soft, nontender, nondistended, + BS MS: no deformity or atrophy  Skin: warm and dry, no rash Neuro:  Alert and Oriented x 3, Strength and sensation are intact Psych: euthymic mood, full affect   Labs    Chemistry Recent Labs  Lab 01/16/18 0456  01/20/18 0438 01/20/18 1007 01/21/18 0509 01/22/18 0328  NA 137   < > 138 137 138 136  K 3.6   < > 3.6 3.6 3.6 3.8  CL 99   < > 102 97* 102 102  CO2 26   < > 27  --  27 25  GLUCOSE 126*   < > 111* 121* 107* 110*  BUN 107*   < > 62* 26* 41* 56*  CREATININE 2.84*   < > 3.24* 1.50* 2.74* 3.36*  CALCIUM 8.0*   < > 7.8*  --  7.6* 7.7*  PROT 4.7*  --   --   --  4.6*  --   ALBUMIN 1.9*   < > 1.6*  --  1.6* 1.7*  AST 26  --   --   --  24  --   ALT 36  --   --   --  30  --  ALKPHOS 78  --   --   --  74  --   BILITOT 0.4  --   --   --  <0.1*  --   GFRNONAA 20*   < > 17*  --  21* 16*  GFRAA 23*   < > 20*  --  24* 19*  ANIONGAP 12   < > 9  --  9 9   < > = values in this interval not displayed.     Hematology Recent Labs  Lab 01/19/18 0354 01/20/18 0718 01/20/18 1007 01/21/18 0509  WBC 19.9* 15.8*  --  16.1*  RBC 3.84* 3.67*  --  3.57*  HGB 9.1* 8.7* 9.5* 8.3*  HCT 28.9* 28.3* 28.0* 27.0*  MCV 75.3* 77.1*  --  75.6*  MCH 23.7* 23.7*  --  23.2*  MCHC 31.5 30.7  --  30.7  RDW 21.7* 22.2*  --  22.0*  PLT 338 375  --  357    Cardiac EnzymesNo results for input(s): TROPONINI in the last 168 hours. No results for input(s): TROPIPOC in the last 168 hours.   BNPNo results for input(s): BNP, PROBNP in the last 168 hours.   DDimer No results for input(s): DDIMER in the last 168 hours.   Radiology    No results found.  Cardiac Studies   Echocardiogram 01/13/2018 -EF 65%.  D-shaped septum.  Patient Profile     78 y.o. male with a PMH of HTN, HLD, bicuspid aortic valve with moderate AS, NSCLC s/p resection and radiation, and PUD,who is being seen today for the  evaluation of CHFat the request of Dr. Denton Brick with new onset atrial fibrillation.  Assessment & Plan    New-onset atrial fibrillation with rapid ventricular response -After stopping IV amiodarone yesterday, he did go back into A. fib with RVR.  I decided to place him on amiodarone p.o. 400 mg twice a day.  Currently he is in sinus rhythm again.  Lets continue with this load, 400 twice daily for 1 week, 200 twice daily for 2 weeks then 200 once a day thereafter - Blood pressure recently to soft for beta-blocker calcium channel blocker and carvedilol was decreased early on because of soft blood pressure. - CHADSVASc 3 - I think at this time since he converted so quickly, I would try to avoid anticoagulation especially given his recent GI bleed.  Of course, if he returns to atrial fibrillation, he would be at increased risk for stroke.   Acute diastolic heart failure -EF 65% but severe RV dilatation and moderate RV dysfunction was noted. -Right heart cath showed normal left-sided filling pressures but mildly elevated right-sided filling pressures as well as mild pulmonary hypertension.  Cardiac output was normal. -Continue with current medical management with dialysis for volume management.  No changes made  End-stage renal disease on hemodialysis - HD catheter placed.  No changes  GI bleed - Hemoglobin 6.4 up to 9.5 after 2 units.  Nonbleeding gastric ulcer seen on EGD.  Appears stable  Mild to moderate aortic stenosis bioprosthetic aortic valve - Echo 01/13/2018 mean gradient 22.  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  Hematology appointment upcoming.      For questions or updates, please contact [place] Please consult [email or web address] for contact info under        Signed, Candee Furbish, MD  01/22/2018, 1:47 PM

## 2018-01-22 NOTE — Progress Notes (Addendum)
Pt's HR noted to be 140-165's in A-fib. Pt currently a/o  X 4, showing no signs of distress. MD notified. RN waiting for new orders, and will continue to monitor.

## 2018-01-22 NOTE — Progress Notes (Signed)
Pt's HR sustaining in the 120-130 range. Pt is asymptomatic. Cardiologist on call notified. RN awaiting new orders, will continue to monitor.

## 2018-01-22 NOTE — Progress Notes (Signed)
[facility]    NEPHROLOGY PROGRESS NOTE  SUBJECTIVE: Currently only feeling well without acute complaints.  Denies chest pain, shortness of breath, nausea, vomiting, diarrhea or dysuria.  Does have significant lower extremity edema.  Denies skin rash, or myalgias.  All other review of systems are negative.  Wife is at the bedside.  Had dialysis yesterday.  OBJECTIVE:  Vitals:   01/22/18 1019 01/22/18 1624  BP:  114/69  Pulse: 79 78  Resp: 18   Temp:    SpO2: 94%    I/O last 3 completed shifts: In: -  Out: 3225 [Urine:225; Other:3000]   Genearl:  AAOx3 NAD HEENT: MMM [place] AT anicteric sclera Neck:  No JVD, no adenopathy CV:  Heart RRR  Lungs:  L/S CTA bilaterally Abd:  abd SNT/ND with normal BS GU:  Bladder non-palpable Extremities: 3+ bilateral lower extremity edema Skin:  No skin rash  MEDICATIONS:   Current Facility-Administered Medications:  .  0.9 %  sodium chloride infusion, 250 mL, Intravenous, PRN, End, Harrell Gave, MD .  acetaminophen (TYLENOL) tablet 650 mg, 650 mg, Oral, Q6H PRN **OR** acetaminophen (TYLENOL) suppository 650 mg, 650 mg, Rectal, Q6H PRN, End, Christopher, MD .  amiodarone (PACERONE) tablet 400 mg, 400 mg, Oral, BID, Jerline Pain, MD, 400 mg at 01/22/18 0852 .  carvedilol (COREG) tablet 3.125 mg, 3.125 mg, Oral, BID WC, Fudim, Marat, MD, 3.125 mg at 01/22/18 1624 .  Chlorhexidine Gluconate Cloth 2 % PADS 6 each, 6 each, Topical, Q0600, Justin Mend, MD, 6 each at 01/22/18 0518 .  feeding supplement (NEPRO CARB STEADY) liquid 237 mL, 237 mL, Oral, TID BM, End, Christopher, MD, Last Rate: 237 mL/hr at 01/21/18 2005, 237 mL at 01/21/18 2005 .  heparin injection 5,000 Units, 5,000 Units, Subcutaneous, Q8H, End, Christopher, MD, 5,000 Units at 01/22/18 1624 .  heparin injection, , Intravenous, PRN, Jacqulynn Cadet, MD, 1,000 Units at 01/20/18 1154 .  lidocaine (XYLOCAINE) 1 % (with pres) injection, , Infiltration, PRN, Jacqulynn Cadet, MD, 5 mL at 01/16/18 1154 .  MEDLINE mouth rinse, 15 mL, Mouth Rinse, BID, End, Christopher, MD, 15 mL at 01/22/18 0855 .  mometasone-formoterol (DULERA) 100-5 MCG/ACT inhaler 2 puff, 2 puff, Inhalation, BID, End, Christopher, MD, 2 puff at 01/22/18 1018 .  multivitamin with minerals tablet 1 tablet, 1 tablet, Oral, Daily, End, Christopher, MD, 1 tablet at 01/22/18 [phone] .  Netarsudil-Latanoprost 0.02-0.005 % SOLN 1 drop, 1 drop, Both Eyes, QHS, End, Christopher, MD, 1 drop at 01/21/18 2204 .  ondansetron (ZOFRAN) tablet 4 mg, 4 mg, Oral, Q6H PRN **OR** ondansetron (ZOFRAN) injection 4 mg, 4 mg, Intravenous, Q6H PRN, End, Christopher, MD .  pantoprazole (PROTONIX) EC tablet 40 mg, 40 mg, Oral, BID, End, Christopher, MD, 40 mg at 01/22/18 0853 .  sodium chloride flush (NS) 0.9 % injection 10-40 mL, 10-40 mL, Intracatheter, PRN, Deterding, Johnthan, MD .  sodium chloride flush (NS) 0.9 % injection 3 mL, 3 mL, Intravenous, Q12H, End, Christopher, MD, 3 mL at 01/22/18 0903 .  sodium chloride flush (NS) 0.9 % injection 3 mL, 3 mL, Intravenous, PRN, End, Christopher, MD .  zolpidem (AMBIEN) tablet 5 mg, 5 mg, Oral, QHS PRN, Denton Brick, Courage, MD, 5 mg at 01/19/18 2141     LABS:  CBC Latest Ref Rng & Units 01/21/2018 01/20/2018 01/20/2018  WBC 4.0 - 10.5 K/uL 16.1(H) - 15.8(H)  Hemoglobin 13.0 - 17.0 g/dL 8.3(L) 9.5(L) 8.7(L)  Hematocrit 39.0 - 52.0 % 27.0(L) 28.0(L) 28.3(L)  Platelets  150 - 400 K/uL 357 - 375    CMP Latest Ref Rng & Units 01/22/2018 01/21/2018 01/20/2018  Glucose 70 - 99 mg/dL 110(H) 107(H) 121(H)  BUN 8 - 23 mg/dL 56(H) 41(H) 26(H)  Creatinine 0.61 - 1.24 mg/dL 3.36(H) 2.74(H) 1.50(H)  Sodium 135 - 145 mmol/L 136 138 137  Potassium 3.5 - 5.1 mmol/L 3.8 3.6 3.6  Chloride 98 - 111 mmol/L 102 102 97(L)  CO2 22 - 32 mmol/L 25 27 -  Calcium 8.9 - 10.3 mg/dL 7.7(L) 7.6(L) -  Total Protein 6.5 - 8.1 g/dL - 4.6(L) -  Total Bilirubin 0.3 - 1.2 mg/dL - <0.1(L) -  Alkaline Phos 38 - 126 U/L -  74 -  AST 15 - 41 U/L - 24 -  ALT 0 - 44 U/L - 30 -    Lab Results  Component Value Date   CALCIUM 7.7 (L) 01/22/2018   CAION 1.09 (L) 01/20/2018   PHOS 2.7 01/22/2018       Component Value Date/Time   COLORURINE YELLOW 01/13/2018 1058   APPEARANCEUR HAZY (A) 01/13/2018 1058   LABSPEC 1.014 01/13/2018 1058   PHURINE 5.0 01/13/2018 1058   GLUCOSEU NEGATIVE 01/13/2018 1058   HGBUR NEGATIVE 01/13/2018 1058   BILIRUBINUR NEGATIVE 01/13/2018 1058   KETONESUR NEGATIVE 01/13/2018 1058   PROTEINUR 100 (A) 01/13/2018 1058   NITRITE NEGATIVE 01/13/2018 1058   LEUKOCYTESUR NEGATIVE 01/13/2018 1058      Component Value Date/Time   HCO3 27.7 01/19/2018 0753   HCO3 27.5 01/19/2018 0753   TCO2 27 01/20/2018 1007   O2SAT 53.0 01/19/2018 0753   O2SAT 56.0 01/19/2018 0753       Component Value Date/Time   IRON 29 (L) 12/21/2017 0802   TIBC 237 (L) 12/21/2017 0802   FERRITIN 348 12/21/2017 0802   IRONPCTSAT 12 (L) 12/21/2017 0802       ASSESSMENT/PLAN:     Problem List Items Addressed This Visit      Digestive   * (Principal) GI bleed - Primary     Other   Iron deficiency anemia    Other Visit Diagnoses    Congestive heart failure, unspecified HF chronicity, unspecified heart failure type (HCC)       Relevant Medications   furosemide (LASIX) injection 60 mg (Completed)   furosemide (LASIX) injection 60 mg (Completed)   metolazone (ZAROXOLYN) tablet 5 mg (Completed)   metoprolol tartrate (LOPRESSOR) injection 5 mg (Completed)   heparin injection   heparin 1000 UNIT/ML injection (Completed)   carvedilol (COREG) tablet 3.125 mg   heparin injection 5,000 Units   amiodarone (NEXTERONE) 1.8 mg/mL load via infusion 150 mg (Completed)   metoprolol tartrate (LOPRESSOR) injection 5 mg (Completed)   amiodarone (PACERONE) tablet 400 mg   Oligouria       Relevant Orders   US RENAL (Completed)   SOB (shortness of breath)       Relevant Orders   DG Chest 2 View (Completed)    Acute renal failure (ARF) (HCC)       Relevant Orders   IR Fluoro Guide CV Line Right (Completed)   IR US Guide Vasc Access Right (Completed)      78 year old male patient with a past medical history significant for moderate aortic stenosis, 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  He had complained of shortness of breath, and had one episode of black stools.  He was admitted  for further evaluation.  He was found to be markedly fluid overloaded with increasing bilateral pleural effusions and lower extremity edema felt to be related to acute on chronic diastolic congestive heart failure.  He did not respond to IV Lasix, and had worsening renal function prompting dialysis initiation.  He did have a prior work-up that was concerning for hemochromatosis per MRI in November 2018.  He also noted some urinary hesitancy and weak stream.  1.  Chronic kidney disease stage III with a baseline serum creatinine around 1.7-1.8.  His serum creatinine worsened in December to 2.6.  He is a urine protein to creatinine ratio of 1.4 g.  Serologies so far have been negative.  He required dialysis initiation on 12/30 for worsening azotemia and continued peripheral volume overload not responsive to Lasix drip.  His last dialysis treatment was on Saturday.  A repeat urine protein to creatinine ratio is pending to evaluate for possible nephrotic syndrome.  We will plan for dialysis on a Tuesday, Thursday, Saturday dialysis schedule.  2.  Acute on chronic heart failure with preserved ejection fraction.  He has underlying RV dysfunction.  Diuretics are on hold due to lack of efficacy.  Will plan ultrafiltration on dialysis.  3.  SVT with ultrafiltration.  Will monitor electrolytes closely.  4.  Acute hypoxic respiratory failure.  Improving with volume removal on dialysis.  5.  Acute GI bleed.  Status post GI eval and packed RBCs.  6.   Anemia secondary to acute blood loss.  Continue packed RBCs as necessary.  6.  Urinary hesitancy.  Maintain Foley for now.     [place], DO, [place]

## 2018-01-22 NOTE — Progress Notes (Signed)
   AMIO 400mg  PO BID. Back in AFIB.  Candee Furbish, MD

## 2018-01-23 LAB — CREATININE CLEARANCE, URINE, 24 HOUR
Collection Interval-CRCL: 24 hours
Creatinine Clearance: 18 mL/min — ABNORMAL LOW (ref 75–125)
Creatinine, 24H Ur: 913 mg/d (ref 800–2000)
Creatinine, Urine: 304.4 mg/dL
Urine Total Volume-CRCL: 300 mL

## 2018-01-23 LAB — CBC
HCT: 25.6 % — ABNORMAL LOW (ref 39.0–52.0)
Hemoglobin: 7.9 g/dL — ABNORMAL LOW (ref 13.0–17.0)
MCH: 23.2 pg — ABNORMAL LOW (ref 26.0–34.0)
MCHC: 30.9 g/dL (ref 30.0–36.0)
MCV: 75.1 fL — ABNORMAL LOW (ref 80.0–100.0)
NRBC: 0 % (ref 0.0–0.2)
PLATELETS: 363 10*3/uL (ref 150–400)
RBC: 3.41 MIL/uL — ABNORMAL LOW (ref 4.22–5.81)
RDW: 22.5 % — ABNORMAL HIGH (ref 11.5–15.5)
WBC: 14.8 10*3/uL — ABNORMAL HIGH (ref 4.0–10.5)

## 2018-01-23 LAB — RENAL FUNCTION PANEL
ALBUMIN: 1.6 g/dL — AB (ref 3.5–5.0)
Anion gap: 8 (ref 5–15)
BUN: 67 mg/dL — ABNORMAL HIGH (ref 8–23)
CO2: 25 mmol/L (ref 22–32)
Calcium: 7.4 mg/dL — ABNORMAL LOW (ref 8.9–10.3)
Chloride: 103 mmol/L (ref 98–111)
Creatinine, Ser: 3.64 mg/dL — ABNORMAL HIGH (ref 0.61–1.24)
GFR calc Af Amer: 17 mL/min — ABNORMAL LOW (ref >60)
GFR calc non Af Amer: 15 mL/min — ABNORMAL LOW (ref >60)
Glucose, Bld: 110 mg/dL — ABNORMAL HIGH (ref 70–99)
Phosphorus: 2.8 mg/dL (ref 2.5–4.6)
Potassium: 3.8 mmol/L (ref 3.5–5.1)
Sodium: 136 mmol/L (ref 135–145)

## 2018-01-23 NOTE — Progress Notes (Signed)
Nutrition Follow-up  DOCUMENTATION CODES:   Non-severe (moderate) malnutrition in context of chronic illness  INTERVENTION:   - Nepro Shake po TID, each supplement provides 425 kcal and 19 grams protein  - PM snack daily  - Encourage adequate PO intake  NUTRITION DIAGNOSIS:   Moderate Malnutrition related to chronic illness (PUD, CHF, NSCLC of right lung) as evidenced by mild fat depletion, moderate fat depletion, mild muscle depletion, moderate muscle depletion.  Ongoing  GOAL:   Patient will meet greater than or equal to 90% of their needs  Progressing  MONITOR:   Diet advancement, Labs, Weight trends, Supplement acceptance, I & O's  REASON FOR ASSESSMENT:   Malnutrition Screening Tool    ASSESSMENT:   78 year old male who presented to the ED on 12/23 with low hemoglobin. Pt with recent admission for GI bleed related to PUD. PMH significant for CHF, HTN, HLD, NSCLC of right lung.  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 Suspect this is related to fluid status.  Discussed pt with RN. Per RN, pt has not yet received Nepro Shake today.  Spoke with pt and wife at bedside. Pt reports that appetite continues to be poor but that "I am making myself eat because I know I need to." Pt states that he is drinking Nepro Shakes even though he does not like them as much as Ensure Enlive. RD encouraged continued PO intake and supplement intake. Pt states, "overall, it's getting better."  Pt states that he ate "about 50%" of breakfast this morning (French toast, fruit cocktail).  RD offered to order snacks BID between meals. Pt states that the meals come frequently but that he is willing to try an afternoon snack between lunch and dinner. Pt prefers fruit and pudding. RD to  order via [place].  Meal Completion: 30-100% x last 5 recorded meals  Medications reviewed and include: Nepro TID (pt accepting ~50%), MVI with minerals  Labs reviewed: hemoglobin 7.9 (L)  UOP: 200 ml x 24 hours Net UF on 1/4: 3000 ml Post-HD weight on 1/4: 90.1 kg  Diet Order:   Diet Order            Diet renal with fluid restriction Fluid restriction: 1200 mL Fluid; Room service appropriate? Yes; Fluid consistency: Thin  Diet effective now              EDUCATION NEEDS:   Education needs have been addressed  Skin:  Skin Assessment: Skin Integrity Issues: Stage II: bilateral ears  Last BM:  1/5 (large type 4)  Height:   Ht Readings from Last 1 Encounters:  01/09/18 5' 10.5" (1.791 m)    Weight:   Wt Readings from Last 1 Encounters:  01/22/18 90.9 kg    Ideal Body Weight:  76.8 kg  BMI:  Body mass index is 28.35 kg/m.  Estimated Nutritional Needs:   Kcal:  2000-2200  Protein:  100-115 grams  Fluid:  >/= 2.0 L    Gaynell Face, MS, RD, LDN Inpatient Clinical Dietitian Pager: [phone] Weekend/After Hours: [phone]

## 2018-01-23 NOTE — Telephone Encounter (Signed)
Patient is still inpatient as of 01/22/2018; will recheck status at a later date/time

## 2018-01-23 NOTE — Progress Notes (Addendum)
DAILY PROGRESS NOTE   Patient Name: Andrew Guerra Date of Encounter: 01/23/2018  Chief Complaint   No complaints  Patient Profile   Andrew Guerra is a 78 y.o. male with a PMH of HTN, HLD, bicuspid aortic valve with moderate AS, NSCLC s/p resection and radiation, and PUD, who is being seen today for the evaluation of CHF.  Subjective   Struggles with PAF- now on dialysis for UF. Mildly elevated right heart pressures. Low albumin - urine work-up has been un-revealing.  Objective   Vitals:   01/22/18 2119 01/23/18 0523 01/23/18 0848 01/23/18 1013  BP:  108/73  100/63  Pulse:  (!) 115  (!) 116  Resp:      Temp:  98.6 F (37 C)    TempSrc:  Oral    SpO2: 94% 92% 96%   Weight:      Height:        Intake/Output Summary (Last 24 hours) at 01/23/2018 1056 Last data filed at 01/23/2018 8366 Gross per 24 hour  Intake 240 ml  Output 200 ml  Net 40 ml   Filed Weights   01/21/18 0825 01/21/18 1130 01/22/18 0500  Weight: 93.2 kg 90.1 kg 90.9 kg    Physical Exam   General appearance: alert and no distress Neck: no carotid bruit, no JVD and thyroid not enlarged, symmetric, no tenderness/mass/nodules Lungs: diminished breath sounds bibasilar Heart: regular rate and rhythm, S1, S2 normal and systolic murmur: systolic ejection 3/6, crescendo at 2nd right intercostal space Abdomen: soft, non-tender; bowel sounds normal; no masses,  no organomegaly Extremities: edema 3+ edema Pulses: 2+ and symmetric Skin: Skin color, texture, turgor normal. No rashes or lesions Neurologic: Grossly normal Psych: Pleasant  Inpatient Medications    Scheduled Meds: . amiodarone  400 mg Oral BID  . carvedilol  3.125 mg Oral BID WC  . Chlorhexidine Gluconate Cloth  6 each Topical Q0600  . feeding supplement (NEPRO CARB STEADY)  237 mL Oral TID BM  . heparin  5,000 Units Subcutaneous Q8H  . mouth rinse  15 mL Mouth Rinse BID  . mometasone-formoterol  2 puff Inhalation BID  . multivitamin with  minerals  1 tablet Oral Daily  . Netarsudil-Latanoprost  1 drop Both Eyes QHS  . pantoprazole  40 mg Oral BID  . sodium chloride flush  3 mL Intravenous Q12H    Continuous Infusions: . sodium chloride      PRN Meds: sodium chloride, acetaminophen **OR** acetaminophen, heparin, lidocaine, ondansetron **OR** ondansetron (ZOFRAN) IV, sodium chloride flush, sodium chloride flush, zolpidem   Labs   Results for orders placed or performed during the hospital encounter of 01/09/18 (from the past 48 hour(s))  Creatinine clearance, urine, 24 hour     Status: Abnormal   Collection Time: 01/22/18 12:30 AM  Result Value Ref Range   Urine Total Volume-CRCL 300 mL   Collection Interval-CRCL 24 hours   Creatinine, Urine 304.40 mg/dL   Creatinine, 24H Ur 913 800 - 2,000 mg/day   Creatinine Clearance 18 (L) 75 - 125 mL/min    Comment: Performed at [facility], [address] [place], [place] [address]  Renal function panel     Status: Abnormal   Collection Time: 01/22/18  3:28 AM  Result Value Ref Range   Sodium 136 135 - 145 mmol/L   Potassium 3.8 3.5 - 5.1 mmol/L   Chloride 102 98 - 111 mmol/L   CO2 25 22 - 32 mmol/L   Glucose,  Bld 110 (H) 70 - 99 mg/dL   BUN 56 (H) 8 - 23 mg/dL   Creatinine, Ser 3.36 (H) 0.61 - 1.24 mg/dL   Calcium 7.7 (L) 8.9 - 10.3 mg/dL   Phosphorus 2.7 2.5 - 4.6 mg/dL   Albumin 1.7 (L) 3.5 - 5.0 g/dL   GFR calc non Af Amer 16 (L) >60 mL/min   GFR calc Af Amer 19 (L) >60 mL/min   Anion gap 9 5 - 15    Comment: Performed at [facility] [address] [place], [place] [address]  Renal function panel     Status: Abnormal   Collection Time: 01/23/18  3:33 AM  Result Value Ref Range   Sodium 136 135 - 145 mmol/L   Potassium 3.8 3.5 - 5.1 mmol/L   Chloride 103 98 - 111 mmol/L   CO2 25 22 - 32 mmol/L   Glucose, Bld 110 (H) 70 - 99 mg/dL   BUN 67 (H) 8 - 23 mg/dL   Creatinine, Ser 3.64 (H) 0.61 - 1.24 mg/dL   Calcium 7.4 (L) 8.9 - 10.3 mg/dL    Phosphorus 2.8 2.5 - 4.6 mg/dL   Albumin 1.6 (L) 3.5 - 5.0 g/dL   GFR calc non Af Amer 15 (L) >60 mL/min   GFR calc Af Amer 17 (L) >60 mL/min   Anion gap 8 5 - 15    Comment: Performed at [facility] [address], [place], [place] [address]  CBC     Status: Abnormal   Collection Time: 01/23/18  3:33 AM  Result Value Ref Range   WBC 14.8 (H) 4.0 - 10.5 K/uL   RBC 3.41 (L) 4.22 - 5.81 MIL/uL   Hemoglobin 7.9 (L) 13.0 - 17.0 g/dL    Comment: Reticulocyte Hemoglobin testing may be clinically indicated, consider ordering this additional test [id number]    HCT 25.6 (L) 39.0 - 52.0 %   MCV 75.1 (L) 80.0 - 100.0 fL   MCH 23.2 (L) 26.0 - 34.0 pg   MCHC 30.9 30.0 - 36.0 g/dL   RDW 22.5 (H) 11.5 - 15.5 %   Platelets 363 150 - 400 K/uL   nRBC 0.0 0.0 - 0.2 %    Comment: Performed at [facility], [place] [address], [place],  [address]    ECG   N/A  Telemetry   PAF, in and out - now sinus tach vs. Atrial flutter - Personally Reviewed  Radiology    No results found.  Cardiac Studies   RIGHT HEART CATH  Conclusion   Conclusions: 1. Normal left heart filling pressure. 2. Mildly elevated right heart filling pressure. 3. 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 4. Normal cardiac output/index.  Recommendations: 1. Medical therapy.  Nelva Bush, MD [facility] Pager: [phone]    Assessment   Principal Problem:   GI bleed Active Problems:   Essential hypertension, benign   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   Malignant neoplasm of left lung (HCC)   Acute on chronic diastolic CHF (congestive heart failure) (HCC)   Malnutrition of moderate degree   Iron deficiency anemia   Anasarca   Intestinal metaplasia of gastric mucosa   Pressure injury of skin   Plan   Continues to have anasarca, however, volume improved with UF/dialysis. Awaiting 24 hour urine results - may need renal biopsy if there is nephrotic range proteinuria.  Although there is RV dysfunction, this is not likely driving the current exacerbation -  however, could have been implicated in long-term cardiorenal process. Continue po amiodarone loading and taper as described.  Time Spent Directly with Patient:  I have spent a total of 25 minutes with the patient reviewing hospital notes, telemetry, EKGs, labs and examining the patient as well as establishing an assessment and plan that was discussed personally with the patient.  > 50% of time was spent in direct patient care.  Length of Stay:  LOS: 14 days   Pixie Casino, MD, [facility], [place] Director of the [facility] Lipid Disorders &  Cardiovascular [facility] Diplomate of the American Board of Clinical Lipidology Attending Cardiologist  Direct Dial: [phone]  Fax: [phone]  Website:  [email or web address] Osgood Desirae Mancusi 01/23/2018, 10:56 AM

## 2018-01-23 NOTE — Progress Notes (Signed)
Patient Demographics:    Andrew Guerra, is a 78 y.o. male, DOB - 1940-10-29, WCH:[id number]  Admit date - 01/09/2018   Admitting Physician [name], DO  Outpatient Primary MD for the patient is [name], MD  LOS - 16   Chief Complaint  Patient presents with  . 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 The degree of leftward septal   shift and D shaped septum has worsened. There is now a   trivial-small pericardial effusion. Side by side comparison of   images performed  Brief Summary 78 y.o. male with a PMH of HTN, HLD, bicuspid aortic valve with moderate AS, NSCLC s/p resection and radiation, and PUD admitted from PCPs office on 01/09/2018 with hemoglobin of 6.4, patient with volume overload and anasarca, patient will be initiated on hemodialysis on 01/16/2018 to try to address his volume status, had RHC on 01/19/2018, paroxysmal A. fib currently on amiodarone, volume improved with UF/dialysis. Awaiting 24 hour urine results - may need renal biopsy if there is nephrotic range proteinuria  Plan:- 1)Acute Gi Bleed--- status post  EGD on 01/10/2018 with nonbleeding gastric and duodenal ulcers,  continue PPI twice daily, monitor H&H and transfuse as clinically indicated, hemoglobin is   7.9,   Post-transfusion of 2 units of packed cells this admission, monitor closely and transfuse as clinically indicated  2)HFpEF--acute on chronic diastolic dysfunction CHF exacerbation, last known EF 60 to 65% with moderate aortic stenosis... 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  ANA neg.  Liver looks overall ok on CT, volume improved with UF/dialysis. 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  10) leukocytosis-- ??  Reactive in setting of underlying malignancy/inflammation, infection less likely, no fevers, continue to monitor consider further imaging studies of the chest and abdomen if indicated, white count is > 14  Disposition/Need for in-Hospital Stay- patient unable to be discharged at this time due to significant worsening of heart failure, Renal failure and volume overload requiring further diuresis and monitoring (see subjective section above), patient will need additional hemodialysis, still awaiting further clinical and lab data to determine if patient will need long-term hemodialysis in which case --- patient will need outpatient hemodialysis chair/spot prior to discharge----  Code Status : Full  Family Communication:   Wife at bedside  Disposition Plan  : To be determined--nephrology need to determine if patient needs short-term versus long-term hemodialysis  Consults  : Cardiology/GI/Nephrology/IR for HD catheter placement.... Consider hematology consult   DVT Prophylaxis  :  TEDS/ SCDs   Lab Results  Component Value Date   PLT 363 01/23/2018   Inpatient Medications  Scheduled Meds: . amiodarone  400 mg Oral BID  . carvedilol  3.125 mg Oral BID WC  . Chlorhexidine Gluconate Cloth  6 each Topical  Q0600  . feeding supplement (NEPRO CARB STEADY)  237 mL Oral TID BM  . heparin  5,000 Units Subcutaneous Q8H  . mouth rinse  15 mL Mouth Rinse BID  . mometasone-formoterol  2 puff Inhalation BID  . multivitamin with minerals  1 tablet Oral Daily  . Netarsudil-Latanoprost  1 drop Both Eyes QHS  . pantoprazole  40 mg Oral BID  . sodium chloride flush  3 mL Intravenous Q12H   Continuous Infusions: . sodium chloride     PRN Meds:.sodium chloride, acetaminophen **OR** acetaminophen, heparin, lidocaine, ondansetron **OR** ondansetron (ZOFRAN) IV, sodium chloride flush, sodium chloride flush, zolpidem   Anti-infectives (From admission, onward)   Start     Dose/Rate Route Frequency Ordered Stop   01/16/18 1230  ceFAZolin (ANCEF) IVPB 2g/100 mL premix    Note to Pharmacy:  Pre IR procedure, procedure time 12/30 not yet known   2 g 200 mL/hr over 30 Minutes Intravenous To Surgery 01/15/18 1112 01/17/18 1230        Objective:   Vitals:   01/22/18 2119 01/23/18 0523 01/23/18 0848 01/23/18 1013  BP:  108/73  100/63  Pulse:  (!) 115  (!) 116  Resp:  Temp:  98.6 F (37 C)    TempSrc:  Oral    SpO2: 94% 92% 96%   Weight:      Height:        Wt Readings from Last 3 Encounters:  01/22/18 90.9 kg  01/09/18 99.7 kg  12/14/17 88.5 kg    Intake/Output Summary (Last 24 hours) at 01/23/2018 1639 Last data filed at 01/23/2018 7494 Gross per 24 hour  Intake 240 ml  Output 200 ml  Net 40 ml    Physical Exam Patient is examined daily including today on 01/23/2018  , exams remain the same as of yesterday except that has changed   Gen:- Awake Alert, able to speak in sentences HEENT:- [place].AT, No sclera icterus Neck-Supple Neck, +ve JVD,.  Right-sided HD catheter Lungs- fair air movement bilaterally, no wheezing  CV- S1, S2 normal, irregular (no longer in A. Fib) 3/6 SM Abd-  +ve B.Sounds, Abd Soft, No tenderness,    Extremity/Skin:- improving edema/anasarca extending from the toes into  the lower abdomen, much improved edema penis and scrotum, has TEDs, pedal pulses present  Psych-affect is appropriate, oriented x3  Neuro-generalized weakness, but no new focal deficits, no tremors GU--- much improved scrotal and penile edema, foley per nephrologist  Data Review:   Micro Results No results found for this or any previous visit (from the past 240 hour(s)).  Radiology Reports Ct Abdomen Pelvis Wo Contrast  Result Date: 01/09/2018 CLINICAL DATA:  Low hemoglobin. EXAM: CT ABDOMEN AND PELVIS WITHOUT CONTRAST TECHNIQUE: Multidetector CT imaging of the abdomen and pelvis was performed following the standard protocol without IV contrast. COMPARISON:  CT 02/04/2015, abdomen MRI 12/09/2017 FINDINGS: Lower chest: Small bilateral pleural effusions. Cardiomegaly without pericardial effusion or thickening. Atherosclerosis of the included thoracic aorta without aneurysm. Coronary arteriosclerosis is seen. Pulmonary consolidation/atelectasis in the right middle lobe distribution. Hepatobiliary: Layering biliary sludge within the gallbladder without secondary signs of acute cholecystitis. The unenhanced liver demonstrates a granuloma in the right hepatic lobe. No definite mass or biliary dilatation. Pancreas: Unremarkable. No pancreatic ductal dilatation or surrounding inflammatory changes. Spleen: Normal size spleen with scattered calcifications noted within possibly representing vascular calcifications or granulomata. Adrenals/Urinary Tract: Scattered bilateral hyperdense lesions of the kidneys are nonspecific but may represent proteinaceous or hemorrhagic cysts, the largest is right-sided and interpolar measuring up 1.6 cm with more ill-defined hyperdensity slightly exophytic off the right kidney. No nephrolithiasis or obstructive uropathy. The urinary bladder is unremarkable for the degree of distention. Stomach/Bowel: Stomach is within normal limits. The appendix is not confidently identified. No  evidence of bowel wall thickening, distention, or inflammatory changes. Vascular/Lymphatic: Aortic atherosclerosis. No lymphadenopathy. Reproductive: Prostatomegaly measuring up to 5.7 cm. Other: Diffuse soft tissue anasarca. No ascites or free air. 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 IMPRESSION: 1. Pulmonary consolidation/atelectasis in the right middle lobe with small bilateral pleural effusions. 2. Scattered bilateral hyperdense lesions of the kidneys likely to represent proteinaceous or hemorrhagic cysts, the largest is in the right interpolar and interpolar measuring 1.6 cm with more ill-defined hyperdensity slightly exophytic off the right kidney. Findings better characterized on recent prior MRI. 3. Hepatic and splenic granulomata. 4. Diffuse soft tissue anasarca. 5. 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 Aortic Atherosclerosis (ICD10-I70.0). Electronically Signed   By: Ashley Royalty M.D.   On: 01/09/2018 20:33   Dg Chest 2 View  Result Date: 01/14/2018 CLINICAL DATA:  Shortness of breath EXAM: CHEST - 2 VIEW COMPARISON:  Chest radiograph 01/09/2018 FINDINGS: Monitoring leads overlie  the patient. Stable cardiac and mediastinal contours. Similar-appearing patchy consolidation within the right greater than left lower lungs. Moderate right and small left pleural effusions. No pneumothorax. IMPRESSION: Similar-appearing patchy consolidation within the right greater than left lower lungs with moderate right and small left pleural effusions. Electronically Signed   By: Lovey Newcomer M.D.   On: 01/14/2018 16:08   US Renal  Result Date: 01/13/2018 CLINICAL DATA:  Oliguria.  Elevated PSA. EXAM: RENAL / URINARY TRACT ULTRASOUND COMPLETE COMPARISON:  CT abdomen and pelvis 01/09/2018. FINDINGS: Right Kidney: Renal measurements: 10.5 x 4.6 x 5.0 cm = volume: 126.8 mL . Echogenicity within normal limits. A simple cyst measuring 1.9 cm in diameter is identified. No hydronephrosis visualized.  Trace amount of perihepatic ascites is noted. Left Kidney: Renal measurements: 11.4 x 5.5 x 5.1 cm = volume: 163.2 mL. Echogenicity within normal limits. No mass or hydronephrosis visualized. Bladder: The bladder is almost completely decompressed. No focal abnormality. Prostatomegaly is seen. IMPRESSION: Negative for hydronephrosis or acute abnormality. Prostatomegaly. Trace amount of ascites. Electronically Signed   By: Inge Rise M.D.   On: 01/13/2018 11:29   Ir Fluoro Guide Cv Line Right  Result Date: 01/16/2018 INDICATION: 78 year old male with acute renal failure in need of hemodialysis. We had an initial plan for placement of a tunneled hemodialysis catheter, however he has leukocytosis today as well as new onset atrial fibrillation with uncontrolled rapid ventricular response. Therefore, we will place a non tunneled hemodialysis catheter to allow him to get hemodialysis quickly without the additional stress of moderate sedation. We can then convert the non tunneled catheter to a tunneled device once his other issues have been addressed. EXAM: IR RIGHT FLOURO GUIDE CV LINE; IR ULTRASOUND GUIDANCE VASC ACCESS RIGHT MEDICATIONS: None ANESTHESIA/SEDATION: None FLUOROSCOPY TIME:  Fluoroscopy Time: 0 minutes 12 seconds (1 mGy). COMPLICATIONS: None immediate. PROCEDURE: Informed written consent was obtained from the patient after a thorough discussion of the procedural risks, benefits and alternatives. All questions were addressed. Maximal Sterile Barrier Technique was utilized including caps, mask, sterile gowns, sterile gloves, sterile drape, hand hygiene and skin antiseptic. A timeout was performed prior to the initiation of the procedure. The right internal jugular vein was interrogated with ultrasound and found to be widely patent. An image was obtained and stored for the medical record. Local anesthesia was attained by infiltration with 1% lidocaine. A small dermatotomy was made. Under real-time  sonographic guidance, the vessel was punctured with an 18 gauge needle. A 0.035 wire was advanced into the inferior vena cava. The needle was removed. The skin tract was dilated and a 16 cm triple-lumen hemodialysis catheter was advanced over the wire and positioned with the catheter tip at the superior cavoatrial junction. The catheter was aspirated and then flushed with heparinized saline. The catheter was then secured to the skin with 0 Prolene suture and a sterile bandage. The patient tolerated the procedure well. IMPRESSION: Successful placement of a non tunneled hemodialysis catheter via the right internal jugular vein. The tip of the catheter is at the superior cavoatrial junction and the catheter is ready for immediate use. Electronically Signed   By: Jacqulynn Cadet M.D.   On: 01/16/2018 14:05   Ir US Guide Vasc Access Right  Result Date: 01/16/2018 INDICATION: 78 year old male with acute renal failure in need of hemodialysis. We had an initial plan for placement of a tunneled hemodialysis catheter, however he has leukocytosis today as well as new onset atrial fibrillation with uncontrolled rapid ventricular response.  Therefore, we will place a non tunneled hemodialysis catheter to allow him to get hemodialysis quickly without the additional stress of moderate sedation. We can then convert the non tunneled catheter to a tunneled device once his other issues have been addressed. EXAM: IR RIGHT FLOURO GUIDE CV LINE; IR ULTRASOUND GUIDANCE VASC ACCESS RIGHT MEDICATIONS: None ANESTHESIA/SEDATION: None FLUOROSCOPY TIME:  Fluoroscopy Time: 0 minutes 12 seconds (1 mGy). COMPLICATIONS: None immediate. PROCEDURE: Informed written consent was obtained from the patient after a thorough discussion of the procedural risks, benefits and alternatives. All questions were addressed. Maximal Sterile Barrier Technique was utilized including caps, mask, sterile gowns, sterile gloves, sterile drape, hand hygiene and skin  antiseptic. A timeout was performed prior to the initiation of the procedure. The right internal jugular vein was interrogated with ultrasound and found to be widely patent. An image was obtained and stored for the medical record. Local anesthesia was attained by infiltration with 1% lidocaine. A small dermatotomy was made. Under real-time sonographic guidance, the vessel was punctured with an 18 gauge needle. A 0.035 wire was advanced into the inferior vena cava. The needle was removed. The skin tract was dilated and a 16 cm triple-lumen hemodialysis catheter was advanced over the wire and positioned with the catheter tip at the superior cavoatrial junction. The catheter was aspirated and then flushed with heparinized saline. The catheter was then secured to the skin with 0 Prolene suture and a sterile bandage. The patient tolerated the procedure well. IMPRESSION: Successful placement of a non tunneled hemodialysis catheter via the right internal jugular vein. The tip of the catheter is at the superior cavoatrial junction and the catheter is ready for immediate use. Electronically Signed   By: Jacqulynn Cadet M.D.   On: 01/16/2018 14:05   Dg Chest Portable 1 View  Result Date: 01/09/2018 CLINICAL DATA:  Shortness of breath for 1 day. 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 EXAM: PORTABLE CHEST 1 VIEW COMPARISON:  Radiographs 11/09/2017. Abdominal MRI 12/09/2017. PET-CT 11/25/2015. FINDINGS: 1902 hours. There are right-greater-than-left pleural effusions which have enlarged. There is associated bibasilar pulmonary opacity, likely atelectasis. There are stable postsurgical changes in the right hilar region. No pneumothorax or acute osseous findings are seen. IMPRESSION: Enlarging right-greater-than-left pleural effusions with associated bibasilar pulmonary opacities, likely atelectasis. No invert pulmonary edema. Electronically Signed   By: Richardean Sale M.D.   On: 01/09/2018 19:35      CBC Recent Labs  Lab 01/17/18 0850 01/19/18 0354 01/20/18 0718 01/20/18 1007 01/21/18 0509 01/23/18 0333  WBC 18.5* 19.9* 15.8*  --  16.1* 14.8*  HGB 8.3* 9.1* 8.7* 9.5* 8.3* 7.9*  HCT 27.1* 28.9* 28.3* 28.0* 27.0* 25.6*  PLT 297 338 375  --  357 363  MCV 77.2* 75.3* 77.1*  --  75.6* 75.1*  MCH 23.6* 23.7* 23.7*  --  23.2* 23.2*  MCHC 30.6 31.5 30.7  --  30.7 30.9  RDW 22.2* 21.7* 22.2*  --  22.0* 22.5*    Chemistries  Recent Labs  Lab 01/19/18 0354 01/20/18 0438 01/20/18 1007 01/21/18 0509 01/22/18 0328 01/23/18 0333  NA 140 138 137 138 136 136  K 3.5 3.6 3.6 3.6 3.8 3.8  CL 103 102 97* 102 102 103  CO2 27 27  --  27 25 25   GLUCOSE 121* 111* 121* 107* 110* 110*  BUN 53* 62* 26* 41* 56* 67*  CREATININE 2.51* 3.24* 1.50* 2.74* 3.36* 3.64*  CALCIUM 7.8* 7.8*  --  7.6* 7.7* 7.4*  AST  --   --   --  24  --   --   ALT  --   --   --  30  --   --   ALKPHOS  --   --   --  74  --   --   BILITOT  --   --   --  <0.1*  --   --     Lab Results  Component Value Date   HGBA1C 5.0 01/14/2018   -----------------------------------------------------------------------------------------------------------------    Component Value Date/Time   BNP 598.0 (H) 01/09/2018 1205   Azai Gaffin M.D on 01/23/2018 at 4:39 PM  Pager---[phone] Go to [email or web address] - password [email or web address] for contact info  [facility] - Office  [phone]

## 2018-01-23 NOTE — Progress Notes (Signed)
[facility]    NEPHROLOGY PROGRESS NOTE  SUBJECTIVE: Currently only feeling well without acute complaints.  Denies chest pain, shortness of breath, nausea, vomiting, diarrhea or dysuria.  Does have significant lower extremity edema.  Denies skin rash, or myalgias.  All other review of systems are negative.    OBJECTIVE:  Vitals:   01/23/18 0848 01/23/18 1013  BP:  100/63  Pulse:  (!) 116  Resp:    Temp:    SpO2: 96%    I/O last 3 completed shifts: In: -  Out: 325 [Urine:325]   Gen[place]l:  AAOx3 NAD HEENT: MMM [place] AT anicteric sclera Neck:  No JVD, no adenopathy CV:  Heart RRR  Lungs:  L/S CTA bilaterally Abd:  abd SNT/ND with normal BS GU:  Bladder non-palpable Extremities: 3+ bilateral lower extremity edema Skin:  No skin rash  MEDICATIONS:   Current Facility-Administered Medications:  .  0.9 %  sodium chloride infusion, 250 mL, Intravenous, PRN, End, Harrell Gave, MD .  acetaminophen (TYLENOL) tablet 650 mg, 650 mg, Oral, Q6H PRN **OR** acetaminophen (TYLENOL) suppository 650 mg, 650 mg, Rectal, Q6H PRN, End, Christopher, MD .  amiodarone (PACERONE) tablet 400 mg, 400 mg, Oral, BID, Jerline Pain, MD, 400 mg at 01/23/18 1014 .  carvedilol (COREG) tablet 3.125 mg, 3.125 mg, Oral, BID WC, Fudim, Marat, MD, 3.125 mg at 01/23/18 1013 .  Chlorhexidine Gluconate Cloth 2 % PADS 6 each, 6 each, Topical, Q0600, Justin Mend, MD, 6 each at 01/23/18 0522 .  feeding supplement (NEPRO CARB STEADY) liquid 237 mL, 237 mL, Oral, TID BM, End, Christopher, MD, Last Rate: 237 mL/hr at 01/23/18 1352, 237 mL at 01/23/18 1352 .  heparin injection 5,000 Units, 5,000 Units, Subcutaneous, Q8H, End, Christopher, MD, 5,000 Units at 01/23/18 1353 .  heparin injection, , Intravenous, PRN, Jacqulynn Cadet, MD, 1,000 Units at 01/20/18 1154 .  lidocaine (XYLOCAINE) 1 % (with pres) injection, , Infiltration, PRN, Jacqulynn Cadet, MD, 5 mL at 01/16/18 1154 .  MEDLINE mouth rinse, 15  mL, Mouth Rinse, BID, End, Christopher, MD, 15 mL at 01/23/18 1031 .  mometasone-formoterol (DULERA) 100-5 MCG/ACT inhaler 2 puff, 2 puff, Inhalation, BID, End, Christopher, MD, 2 puff at 01/23/18 0848 .  multivitamin with minerals tablet 1 tablet, 1 tablet, Oral, Daily, End, Christopher, MD, 1 tablet at 01/23/18 1013 .  Netarsudil-Latanoprost 0.02-0.005 % SOLN 1 drop, 1 drop, Both Eyes, QHS, End, Christopher, MD, 1 drop at 01/22/18 2124 .  ondansetron (ZOFRAN) tablet 4 mg, 4 mg, Oral, Q6H PRN **OR** ondansetron (ZOFRAN) injection 4 mg, 4 mg, Intravenous, Q6H PRN, End, Christopher, MD .  pantoprazole (PROTONIX) EC tablet 40 mg, 40 mg, Oral, BID, End, Christopher, MD, 40 mg at 01/23/18 1013 .  sodium chloride flush (NS) 0.9 % injection 10-40 mL, 10-40 mL, Intracatheter, PRN, Mauricia Area, MD, 10 mL at 01/22/18 2054 .  sodium chloride flush (NS) 0.9 % injection 3 mL, 3 mL, Intravenous, Q12H, End, Christopher, MD, 3 mL at 01/23/18 1031 .  sodium chloride flush (NS) 0.9 % injection 3 mL, 3 mL, Intravenous, PRN, End, Christopher, MD .  zolpidem (AMBIEN) tablet 5 mg, 5 mg, Oral, QHS PRN, Denton Brick, Courage, MD, 5 mg at 01/19/18 2141     LABS:  CBC Latest Ref Rng & Units 01/23/2018 01/21/2018 01/20/2018  WBC 4.0 - 10.5 K/uL 14.8(H) 16.1(H) -  Hemoglobin 13.0 - 17.0 g/dL 7.9(L) 8.3(L) 9.5(L)  Hematocrit 39.0 - 52.0 % 25.6(L) 27.0(L) 28.0(L)  Platelets 150 - 400  K/uL 363 357 -    CMP Latest Ref Rng & Units 01/23/2018 01/22/2018 01/21/2018  Glucose 70 - 99 mg/dL 110(H) 110(H) 107(H)  BUN 8 - 23 mg/dL 67(H) 56(H) 41(H)  Creatinine 0.61 - 1.24 mg/dL 3.64(H) 3.36(H) 2.74(H)  Sodium 135 - 145 mmol/L 136 136 138  Potassium 3.5 - 5.1 mmol/L 3.8 3.8 3.6  Chloride 98 - 111 mmol/L 103 102 102  CO2 22 - 32 mmol/L 25 25 27   Calcium 8.9 - 10.3 mg/dL 7.4(L) 7.7(L) 7.6(L)  Total Protein 6.5 - 8.1 g/dL - - 4.6(L)  Total Bilirubin 0.3 - 1.2 mg/dL - - <0.1(L)  Alkaline Phos 38 - 126 U/L - - 74  AST 15 - 41 U/L - - 24   ALT 0 - 44 U/L - - 30    Lab Results  Component Value Date   CALCIUM 7.4 (L) 01/23/2018   CAION 1.09 (L) 01/20/2018   PHOS 2.8 01/23/2018       Component Value Date/Time   COLORURINE YELLOW 01/13/2018 1058   APPEARANCEUR HAZY (A) 01/13/2018 1058   LABSPEC 1.014 01/13/2018 1058   PHURINE 5.0 01/13/2018 1058   GLUCOSEU NEGATIVE 01/13/2018 1058   HGBUR NEGATIVE 01/13/2018 1058   BILIRUBINUR NEGATIVE 01/13/2018 1058   KETONESUR NEGATIVE 01/13/2018 1058   PROTEINUR 100 (A) 01/13/2018 1058   NITRITE NEGATIVE 01/13/2018 1058   LEUKOCYTESUR NEGATIVE 01/13/2018 1058      Component Value Date/Time   HCO3 27.7 01/19/2018 0753   HCO3 27.5 01/19/2018 0753   TCO2 27 01/20/2018 1007   O2SAT 53.0 01/19/2018 0753   O2SAT 56.0 01/19/2018 0753       Component Value Date/Time   IRON 29 (L) 12/21/2017 0802   TIBC 237 (L) 12/21/2017 0802   FERRITIN 348 12/21/2017 0802   IRONPCTSAT 12 (L) 12/21/2017 0802       ASSESSMENT/PLAN:     Problem List Items Addressed This Visit      Digestive   * (Principal) GI bleed - Primary     Other   Iron deficiency anemia    Other Visit Diagnoses    Congestive heart failure, unspecified HF chronicity, unspecified heart failure type (HCC)       Relevant Medications   furosemide (LASIX) injection 60 mg (Completed)   furosemide (LASIX) injection 60 mg (Completed)   metolazone (ZAROXOLYN) tablet 5 mg (Completed)   metoprolol tartrate (LOPRESSOR) injection 5 mg (Completed)   heparin injection   heparin 1000 UNIT/ML injection (Completed)   carvedilol (COREG) tablet 3.125 mg   heparin injection 5,000 Units   amiodarone (NEXTERONE) 1.8 mg/mL load via infusion 150 mg (Completed)   metoprolol tartrate (LOPRESSOR) injection 5 mg (Completed)   amiodarone (PACERONE) tablet 400 mg   Oligouria       Relevant Orders   US RENAL (Completed)   SOB (shortness of breath)       Relevant Orders   DG Chest 2 View (Completed)   Acute renal failure (ARF) (HCC)        Relevant Orders   IR Fluoro Guide CV Line Right (Completed)   IR US Guide Vasc Access Right (Completed)      78 year old male patient with a past medical history significant for moderate aortic stenosis, 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  He had complained of shortness of breath, and had one episode of black stools.  He was admitted for further evaluation.  He was found to be markedly fluid overloaded with increasing bilateral pleural effusions and lower extremity edema felt to be related to acute on chronic diastolic congestive heart failure.  He did not respond to IV Lasix, and had worsening renal function prompting dialysis initiation.  He did have a prior work-up that was concerning for hemochromatosis per MRI in November 2018.  He also noted some urinary hesitancy and weak stream.  1.  Chronic kidney disease stage III with a baseline serum creatinine around 1.7-1.8.  His serum creatinine worsened in December to 2.6.  He is a urine protein to creatinine ratio of 1.4 g.  Serologies so far have been negative.  He required dialysis initiation on 12/30 for worsening azotemia and continued peripheral volume overload not responsive to Lasix drip.  His last dialysis treatment was on Saturday.  A repeat urine protein to creatinine ratio is pending to evaluate for possible nephrotic syndrome.  We will plan for dialysis on a Tuesday, Thursday, Saturday dialysis schedule.  Additional serologies ordered today.  2.  Acute on chronic heart failure with preserved ejection fraction.  He has underlying RV dysfunction.  Diuretics are on hold due to lack of efficacy.  Will plan ultrafiltration on dialysis.  3.  SVT with ultrafiltration.  Will monitor electrolytes closely.  4.  Acute hypoxic respiratory failure.  Improving with volume removal on dialysis.  5.  Acute GI bleed.  Status post GI eval and packed  RBCs.  6.  Anemia secondary to acute blood loss.  Continue packed RBCs as necessary.  6.  Urinary hesitancy.  Would void trial.     Gaylene Brooks, DO, FACP

## 2018-01-24 LAB — RENAL FUNCTION PANEL
ALBUMIN: 1.6 g/dL — AB (ref 3.5–5.0)
Albumin: 1.5 g/dL — ABNORMAL LOW (ref 3.5–5.0)
Anion gap: 8 (ref 5–15)
Anion gap: 11 (ref 5–15)
BUN: 74 mg/dL — ABNORMAL HIGH (ref 8–23)
BUN: 77 mg/dL — AB (ref 8–23)
CALCIUM: 7.8 mg/dL — AB (ref 8.9–10.3)
CO2: 27 mmol/L (ref 22–32)
CO2: 26 mmol/L (ref 22–32)
Calcium: 8 mg/dL — ABNORMAL LOW (ref 8.9–10.3)
Chloride: 101 mmol/L (ref 98–111)
Chloride: 99 mmol/L (ref 98–111)
Creatinine, Ser: 3.84 mg/dL — ABNORMAL HIGH (ref 0.61–1.24)
Creatinine, Ser: 3.77 mg/dL — ABNORMAL HIGH (ref 0.61–1.24)
GFR calc Af Amer: 17 mL/min — ABNORMAL LOW (ref >60)
GFR calc non Af Amer: 14 mL/min — ABNORMAL LOW (ref >60)
GFR calc non Af Amer: 14 mL/min — ABNORMAL LOW (ref >60)
GFR, EST AFRICAN AMERICAN: 16 mL/min — AB (ref >60)
Glucose, Bld: 112 mg/dL — ABNORMAL HIGH (ref 70–99)
Glucose, Bld: 111 mg/dL — ABNORMAL HIGH (ref 70–99)
Phosphorus: 3.2 mg/dL (ref 2.5–4.6)
Phosphorus: 3.2 mg/dL (ref 2.5–4.6)
Potassium: 3.8 mmol/L (ref 3.5–5.1)
Potassium: 3.7 mmol/L (ref 3.5–5.1)
Sodium: 136 mmol/L (ref 135–145)
Sodium: 136 mmol/L (ref 135–145)

## 2018-01-24 LAB — CBC
HCT: 25.9 % — ABNORMAL LOW (ref 39.0–52.0)
Hemoglobin: 8 g/dL — ABNORMAL LOW (ref 13.0–17.0)
MCH: 23.2 pg — ABNORMAL LOW (ref 26.0–34.0)
MCHC: 30.9 g/dL (ref 30.0–36.0)
MCV: 75.1 fL — ABNORMAL LOW (ref 80.0–100.0)
Platelets: 415 10*3/uL — ABNORMAL HIGH (ref 150–400)
RBC: 3.45 MIL/uL — ABNORMAL LOW (ref 4.22–5.81)
RDW: 22.4 % — ABNORMAL HIGH (ref 11.5–15.5)
WBC: 14.9 10*3/uL — ABNORMAL HIGH (ref 4.0–10.5)
nRBC: 0 % (ref 0.0–0.2)

## 2018-01-24 MED ORDER — SODIUM CHLORIDE 0.9 % IV SOLN: 100.0000 mL | INTRAVENOUS | Status: DC | PRN

## 2018-01-24 MED ORDER — ALTEPLASE 2 MG IJ SOLR: 2.0000 mg | Freq: Once | INTRAMUSCULAR | Status: DC | PRN

## 2018-01-24 MED ORDER — HEPARIN SODIUM (PORCINE) 1000 UNIT/ML IJ SOLN
INTRAMUSCULAR | Status: AC
  Administered 2018-01-24: 2600 [IU] via INTRAVENOUS
  Filled 2018-01-24: qty 3

## 2018-01-24 MED ORDER — HEPARIN SODIUM (PORCINE) 5000 UNIT/ML IJ SOLN
5000.0000 [IU] | Freq: Three times a day (TID) | INTRAMUSCULAR | Status: DC
  Administered 2018-01-25 – 2018-02-09 (×40): 5000 [IU] via SUBCUTANEOUS
  Filled 2018-01-24 (×38): qty 1

## 2018-01-24 MED ORDER — NEPRO/CARBSTEADY PO LIQD
237.0000 mL | Freq: Three times a day (TID) | ORAL | Status: DC
  Administered 2018-01-26 – 2018-02-08 (×27): 237 mL via ORAL

## 2018-01-24 NOTE — Progress Notes (Addendum)
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  He had complained of shortness of breath, and had one episode of black stools.  He was admitted for further evaluation.  He was found to be markedly fluid overloaded with increasing bilateral pleural effusions and lower extremity edema felt to be related to acute on chronic diastolic congestive heart failure.  He did not respond to IV Lasix, and had worsening renal function prompting dialysis initiation.  He did have a prior work-up that was concerning for hemochromatosis per MRI in November 2018.  He also noted some urinary hesitancy and weak stream.  1.  Chronic kidney disease stage III with a baseline serum creatinine around 1.7-1.8.  His serum creatinine worsened in December to 2.6.  He is a urine protein to creatinine ratio of 1.4 g.  Serologies so far have been negative.  He required dialysis initiation on 12/30 for worsening azotemia and continued peripheral volume overload not responsive to Lasix drip.  His last dialysis treatment was on Saturday.  A repeat urine protein to creatinine ratio is pending to evaluate for possible nephrotic syndrome.  We will plan for dialysis on a Tuesday, Thursday, Saturday dialysis schedule. UPC 1.5 w/ neg SPEP and very low Albumin. - Serologies ordered - ANCA, C3C4, RF, RPR, antiGBM have been sent off - ANA neg in 12/2017 - Woud like to biopsy as this is fairly rapid progression of this CKD. Prefer not to assume this is from DM and CHF until proven otherwise. Certainly can see this degree of proteinuria w/ DM. - Trial without foley; PVR q6-8hrs  2.  Acute on chronic heart failure with preserved ejection fraction.  He has underlying RV dysfunction.  Diuretics are on hold due  to lack of efficacy.  Will plan ultrafiltration on dialysis.  3.  SVT with ultrafiltration.  Will monitor electrolytes closely.  4.  Acute hypoxic respiratory failure.  Improving with volume removal on dialysis.  5.  Acute GI bleed.  Status post GI eval and packed RBCs.  6.  Anemia secondary to acute blood loss.  Continue packed RBCs as necessary.  6.  Urinary hesitancy.  Would void trial.  Subjective:   Denies f/ c/n/v/dyspnea/cp.   Objective:   BP 140/69 (BP Location: Left Arm)   Pulse 74   Temp 97.8 F (36.6 C) (Oral)   Resp 20   Ht 5' 10.5" (1.791 m)   Wt 92.3 kg   SpO2 90%   BMI 28.78 kg/m   Intake/Output Summary (Last 24 hours) at 01/24/2018 0954 Last data filed at 01/23/2018 1824 Gross per 24 hour  Intake -  Output 200 ml  Net -200 ml   Weight change:   Physical Exam: General:  AAOx3 NAD, pleasant CV:  Heart RRR  Lungs:  L/S CTA bilaterally Abd:  abd SNT/ND with normal BS GU:  Foley to gravity Extremities: 3+ bilateral lower extremity edema   Imaging: No results found.  Labs: BMET Recent Labs  Lab 01/19/18 0354 01/20/18 0438 01/20/18 1007 01/21/18 0509 01/22/18 0328 01/23/18 0333 01/24/18 0357 01/24/18 0749  NA 140 138 137 138 136 136 136 136  K 3.5 3.6 3.6 3.6 3.8 3.8 3.8 3.7  CL 103 102 97* 102 102 103 101 99  CO2 27 27  --  27 25 25 27 26   GLUCOSE 121* 111* 121* 107* 110* 110* 112* 111*  BUN 53* 62* 26* 41* 56* 67* 74* 77*  CREATININE 2.51* 3.24* 1.50* 2.74* 3.36* 3.64* 3.84* 3.77*  CALCIUM 7.8* 7.8*  --  7.6* 7.7* 7.4* 7.8* 8.0*  PHOS 2.6 3.4  --  2.5 2.7 2.8 3.2 3.2   CBC Recent Labs  Lab 01/20/18 0718 01/20/18 1007 01/21/18 0509 01/23/18 0333 01/24/18 0750  WBC 15.8*  --  16.1* 14.8* 14.9*  HGB 8.7* 9.5* 8.3* 7.9* 8.0*  HCT 28.3* 28.0* 27.0* 25.6* 25.9*  MCV 77.1*  --  75.6* 75.1* 75.1*  PLT 375  --  357 363 415*    Medications:    . amiodarone  400 mg Oral BID  . carvedilol  3.125 mg Oral BID WC  . Chlorhexidine  Gluconate Cloth  6 each Topical Q0600  . feeding supplement (NEPRO CARB STEADY)  237 mL Oral TID BM  . heparin  5,000 Units Subcutaneous Q8H  . mouth rinse  15 mL Mouth Rinse BID  . mometasone-formoterol  2 puff Inhalation BID  . multivitamin with minerals  1 tablet Oral Daily  . Netarsudil-Latanoprost  1 drop Both Eyes QHS  . pantoprazole  40 mg Oral BID  . sodium chloride flush  3 mL Intravenous Q12H      Otelia Santee, MD 01/24/2018, 9:54 AM

## 2018-01-24 NOTE — Consult Note (Signed)
Chief Complaint: Patient was seen in consultation today for acute kidney injury.  Referring Physician(s): Dwana Melena  Supervising Physician: Corrie Mckusick  Patient Status: [facility] - In-pt  History of Present Illness: Andrew Guerra is a 78 y.o. 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 He presented to [facility] ED 01/09/2018 after being sent by his PCP due to low hgb. He was found to have a GI bleed and was admitted for management. During hospitalization, he was found to have an acute kidney injury.  IR requested by Dr. Augustin Coupe for possible image-guided random renal biopsy. Patient awake and alert laying in bed with no complaints at this time. Accompanied by wife at bedside. Denies fever, chills, chest pain, dyspnea, abdominal pain, dizziness, or headache.  Patient is currently receiving Heparin SQ injections.   Past Medical History:  Diagnosis Date  . 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  [name] Cataract    right eye - surgery to remove  . Elevated PSA 11/29/2016   Patient is followed by [facility].  Most recent PSA near 11.  He has had atypia on biopsy.  November 2008  . Essential hypertension, benign 06/08/2012  . Full dentures   . Glaucoma   . Heart murmur    since childhood, never has caused any problems  . History of rheumatic fever 11/04/2015  . Hyperlipidemia 04/25/2013  . Hypertension   . Malignant neoplasm of left lung ([place]) 02/08/2017  . RBBB 07/12/2017  . Sickle cell trait ([place])    no problems per patient    Past Surgical History:  Procedure Laterality Date  . BIOPSY  11/11/2017   Procedure: BIOPSY;  Surgeon: Lavena Bullion, DO;  Location: [facility];   Service: Gastroenterology;;  . BIOPSY  01/10/2018   Procedure: BIOPSY;  Surgeon: [name], MD;  Location: [place];  Service: Gastroenterology;;  . cataract eye surgery Right   . COLONOSCOPY  11/2009   hx polyps/[place]  . ESOPHAGOGASTRODUODENOSCOPY N/A 11/11/2017   Procedure: ESOPHAGOGASTRODUODENOSCOPY (EGD);  Surgeon: Lavena Bullion, DO;  Location: [facility];  Service: Gastroenterology;  Laterality: N/A;  . ESOPHAGOGASTRODUODENOSCOPY (EGD) WITH PROPOFOL N/A 01/10/2018   Procedure: ESOPHAGOGASTRODUODENOSCOPY (EGD) WITH PROPOFOL;  Surgeon: [name], MD;  Location: [place];  Service: Gastroenterology;  Laterality: N/A;  . IR FLUORO GUIDE CV LINE RIGHT  01/16/2018  . IR US GUIDE VASC ACCESS RIGHT  01/16/2018  . PROSTATE BIOPSY    . right eye surgery     to lower eye pressure  . RIGHT HEART CATH N/A 01/19/2018   Procedure: RIGHT HEART CATH;  Surgeon: Nelva Bush, MD;  Location: [place] CV LAB;  Service: Cardiovascular;  Laterality: N/A;  . TONSILLECTOMY    . VIDEO ASSISTED THORACOSCOPY (VATS)/ LOBECTOMY Right 12/26/2015  . wisdom tteeth ext      Allergies: Atorvastatin  Medications: Prior to Admission medications   Medication Sig Start Date End Date Taking? Authorizing Provider  amLODipine (NORVASC) 2.5 MG tablet Take 1 tablet (2.5 mg total) by mouth daily. 11/07/17  Yes [name], MD  aspirin EC 81 MG tablet Take 81 mg by mouth daily.   Yes [provider]  Brinzolamide-Brimonidine (SIMBRINZA) 1-0.2 % SUSP Place 2-3 drops into both  eyes 3 (three) times daily.    Yes [provider]  budesonide-formoterol (SYMBICORT) 80-4.5 MCG/ACT inhaler Inhale 2 puffs into the lungs 2 (two) times daily. 11/01/17  Yes [name], MD  metoprolol succinate (TOPROL-XL) 50 MG 24 hr tablet Take 1/2 tablet daily.Take with or immediately following a meal. 06/23/17  Yes Luking, Elayne Snare, MD  Multiple Vitamin (MULTIVITAMIN) tablet Take 1 tablet  by mouth daily.   Yes [provider]  Netarsudil-Latanoprost (ROCKLATAN) 0.02-0.005 % SOLN Place 1 drop into both eyes at bedtime.    Yes Marylynn Pearson, MD  pantoprazole (PROTONIX) 40 MG tablet Take 1 tablet (40 mg total) by mouth 2 (two) times daily. 11/30/17 01/29/18 Yes Willia Craze, NP  ketoconazole (NIZORAL) 2 % cream Apply to area twice daily Patient not taking: Reported on 01/09/2018 01/03/18   [name], MD  torsemide [facility]) 20 MG tablet Take two tablets by mouth every morning Patient not taking: Reported on 01/09/2018 01/09/18   [name], MD     Family History  Problem Relation Age of Onset  . Diabetes Mother   . Cerebral aneurysm Mother   . Lung cancer Father   . Diabetes Sister   . Colon cancer Neg Hx   . Colon polyps Neg Hx   . Esophageal cancer Neg Hx   . Rectal cancer Neg Hx   . Stomach cancer Neg Hx     Social History   Socioeconomic History  . Marital status: Married    Spouse name: Not on file  . Number of children: Not on file  . Years of education: Not on file  . Highest education level: Not on file  Occupational History  . Not on file  Social Needs  . Financial resource strain: Not on file  . Food insecurity:    Worry: Not on file    Inability: Not on file  . Transportation needs:    Medical: Not on file    Non-medical: Not on file  Tobacco Use  . Smoking status: Former Smoker    Packs/day: 1.00    Types: Cigarettes    Last attempt to quit: 01/19/2011    Years since quitting: 7.0  . Smokeless tobacco: Never Used  Substance and Sexual Activity  . Alcohol use: Yes    Alcohol/week: 1.0 standard drinks    Types: 1 Shots of liquor per week  . Drug use: No  . Sexual activity: Not on file  Lifestyle  . Physical activity:    Days per week: Not on file    Minutes per session: Not on file  . Stress: Not on file  Relationships  . Social connections:    Talks on phone: Not on file    Gets together: Not on file     Attends religious service: Not on file    Active member of club or organization: Not on file    Attends meetings of clubs or organizations: Not on file    Relationship status: Not on file  Other Topics Concern  . Not on file  Social History Narrative  . Not on file     Review of Systems: A 12 point ROS discussed and pertinent positives are indicated in the HPI above.  All other systems are negative.  Review of Systems  Constitutional: Negative for chills and fever.  Respiratory: Negative for shortness of breath and wheezing.   Cardiovascular: Negative for chest pain and palpitations.  Gastrointestinal: Negative for abdominal pain.  Neurological: Negative for dizziness and headaches.  Psychiatric/Behavioral: Negative for behavioral problems and confusion.    Vital Signs: BP 124/67 (BP Location: Left Arm)   Pulse 77   Temp 98.3 F (36.8 C) (Oral)   Resp 17   Ht 5' 10.5" (1.791 m)   Wt 192 lb 7.4 oz (87.3 kg)   SpO2 91%   BMI 27.23 kg/m   Physical Exam Vitals signs and nursing note reviewed.  Constitutional:      General: He is not in acute distress.    Appearance: Normal appearance.  Cardiovascular:     Rate and Rhythm: Normal rate and regular rhythm.     Pulses: Normal pulses.     Heart sounds: Murmur present.  Pulmonary:     Effort: Pulmonary effort is normal. No respiratory distress.     Breath sounds: Normal breath sounds. No wheezing.  Skin:    General: Skin is warm and dry.  Neurological:     Mental Status: He is alert and oriented to person, place, and time.  Psychiatric:        Mood and Affect: Mood normal.        Behavior: Behavior normal.        Thought Content: Thought content normal.        Judgment: Judgment normal.      MD Evaluation Airway: WNL Heart: WNL Abdomen: WNL Chest/ Lungs: WNL ASA  Classification: 3 Mallampati/Airway Score: Two   Imaging: Ct Abdomen Pelvis Wo Contrast  Result Date: 01/09/2018 CLINICAL DATA:  Low hemoglobin.  EXAM: CT ABDOMEN AND PELVIS WITHOUT CONTRAST TECHNIQUE: Multidetector CT imaging of the abdomen and pelvis was performed following the standard protocol without IV contrast. COMPARISON:  CT 02/04/2015, abdomen MRI 12/09/2017 FINDINGS: Lower chest: Small bilateral pleural effusions. Cardiomegaly without pericardial effusion or thickening. Atherosclerosis of the included thoracic aorta without aneurysm. Coronary arteriosclerosis is seen. Pulmonary consolidation/atelectasis in the right middle lobe distribution. Hepatobiliary: Layering biliary sludge within the gallbladder without secondary signs of acute cholecystitis. The unenhanced liver demonstrates a granuloma in the right hepatic lobe. No definite mass or biliary dilatation. Pancreas: Unremarkable. No pancreatic ductal dilatation or surrounding inflammatory changes. Spleen: Normal size spleen with scattered calcifications noted within possibly representing vascular calcifications or granulomata. Adrenals/Urinary Tract: Scattered bilateral hyperdense lesions of the kidneys are nonspecific but may represent proteinaceous or hemorrhagic cysts, the largest is right-sided and interpolar measuring up 1.6 cm with more ill-defined hyperdensity slightly exophytic off the right kidney. No nephrolithiasis or obstructive uropathy. The urinary bladder is unremarkable for the degree of distention. Stomach/Bowel: Stomach is within normal limits. The appendix is not confidently identified. No evidence of bowel wall thickening, distention, or inflammatory changes. Vascular/Lymphatic: Aortic atherosclerosis. No lymphadenopathy. Reproductive: Prostatomegaly measuring up to 5.7 cm. Other: Diffuse soft tissue anasarca. No ascites or free air. 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 IMPRESSION: 1. Pulmonary consolidation/atelectasis in the right middle lobe with small bilateral pleural effusions. 2. Scattered bilateral hyperdense lesions of the kidneys likely to  represent proteinaceous or hemorrhagic cysts, the largest is in the right interpolar and interpolar measuring 1.6 cm with more ill-defined hyperdensity slightly exophytic off the right kidney. Findings better characterized on recent prior MRI. 3. Hepatic and splenic granulomata. 4. Diffuse soft tissue anasarca. 5. 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 Aortic Atherosclerosis (ICD10-I70.0). Electronically Signed   By: Ashley Royalty M.D.   On: 01/09/2018 20:33   Dg Chest 2 View  Result Date: 01/14/2018 CLINICAL DATA:  Shortness of breath EXAM:  CHEST - 2 VIEW COMPARISON:  Chest radiograph 01/09/2018 FINDINGS: Monitoring leads overlie the patient. Stable cardiac and mediastinal contours. Similar-appearing patchy consolidation within the right greater than left lower lungs. Moderate right and small left pleural effusions. No pneumothorax. IMPRESSION: Similar-appearing patchy consolidation within the right greater than left lower lungs with moderate right and small left pleural effusions. Electronically Signed   By: Lovey Newcomer M.D.   On: 01/14/2018 16:08   US Renal  Result Date: 01/13/2018 CLINICAL DATA:  Oliguria.  Elevated PSA. EXAM: RENAL / URINARY TRACT ULTRASOUND COMPLETE COMPARISON:  CT abdomen and pelvis 01/09/2018. FINDINGS: Right Kidney: Renal measurements: 10.5 x 4.6 x 5.0 cm = volume: 126.8 mL . Echogenicity within normal limits. A simple cyst measuring 1.9 cm in diameter is identified. No hydronephrosis visualized. Trace amount of perihepatic ascites is noted. Left Kidney: Renal measurements: 11.4 x 5.5 x 5.1 cm = volume: 163.2 mL. Echogenicity within normal limits. No mass or hydronephrosis visualized. Bladder: The bladder is almost completely decompressed. No focal abnormality. Prostatomegaly is seen. IMPRESSION: Negative for hydronephrosis or acute abnormality. Prostatomegaly. Trace amount of ascites. Electronically Signed   By: Inge Rise M.D.   On: 01/13/2018 11:29   Ir Fluoro Guide Cv  Line Right  Result Date: 01/16/2018 INDICATION: 78 year old male with acute renal failure in need of hemodialysis. We had an initial plan for placement of a tunneled hemodialysis catheter, however he has leukocytosis today as well as new onset atrial fibrillation with uncontrolled rapid ventricular response. Therefore, we will place a non tunneled hemodialysis catheter to allow him to get hemodialysis quickly without the additional stress of moderate sedation. We can then convert the non tunneled catheter to a tunneled device once his other issues have been addressed. EXAM: IR RIGHT FLOURO GUIDE CV LINE; IR ULTRASOUND GUIDANCE VASC ACCESS RIGHT MEDICATIONS: None ANESTHESIA/SEDATION: None FLUOROSCOPY TIME:  Fluoroscopy Time: 0 minutes 12 seconds (1 mGy). COMPLICATIONS: None immediate. PROCEDURE: Informed written consent was obtained from the patient after a thorough discussion of the procedural risks, benefits and alternatives. All questions were addressed. Maximal Sterile Barrier Technique was utilized including caps, mask, sterile gowns, sterile gloves, sterile drape, hand hygiene and skin antiseptic. A timeout was performed prior to the initiation of the procedure. The right internal jugular vein was interrogated with ultrasound and found to be widely patent. An image was obtained and stored for the medical record. Local anesthesia was attained by infiltration with 1% lidocaine. A small dermatotomy was made. Under real-time sonographic guidance, the vessel was punctured with an 18 gauge needle. A 0.035 wire was advanced into the inferior vena cava. The needle was removed. The skin tract was dilated and a 16 cm triple-lumen hemodialysis catheter was advanced over the wire and positioned with the catheter tip at the superior cavoatrial junction. The catheter was aspirated and then flushed with heparinized saline. The catheter was then secured to the skin with 0 Prolene suture and a sterile bandage. The patient  tolerated the procedure well. IMPRESSION: Successful placement of a non tunneled hemodialysis catheter via the right internal jugular vein. The tip of the catheter is at the superior cavoatrial junction and the catheter is ready for immediate use. Electronically Signed   By: Jacqulynn Cadet M.D.   On: 01/16/2018 14:05   Ir US Guide Vasc Access Right  Result Date: 01/16/2018 INDICATION: 78 year old male with acute renal failure in need of hemodialysis. We had an initial plan for placement of a tunneled hemodialysis catheter, however he has leukocytosis  today as well as new onset atrial fibrillation with uncontrolled rapid ventricular response. Therefore, we will place a non tunneled hemodialysis catheter to allow him to get hemodialysis quickly without the additional stress of moderate sedation. We can then convert the non tunneled catheter to a tunneled device once his other issues have been addressed. EXAM: IR RIGHT FLOURO GUIDE CV LINE; IR ULTRASOUND GUIDANCE VASC ACCESS RIGHT MEDICATIONS: None ANESTHESIA/SEDATION: None FLUOROSCOPY TIME:  Fluoroscopy Time: 0 minutes 12 seconds (1 mGy). COMPLICATIONS: None immediate. PROCEDURE: Informed written consent was obtained from the patient after a thorough discussion of the procedural risks, benefits and alternatives. All questions were addressed. Maximal Sterile Barrier Technique was utilized including caps, mask, sterile gowns, sterile gloves, sterile drape, hand hygiene and skin antiseptic. A timeout was performed prior to the initiation of the procedure. The right internal jugular vein was interrogated with ultrasound and found to be widely patent. An image was obtained and stored for the medical record. Local anesthesia was attained by infiltration with 1% lidocaine. A small dermatotomy was made. Under real-time sonographic guidance, the vessel was punctured with an 18 gauge needle. A 0.035 wire was advanced into the inferior vena cava. The needle was removed.  The skin tract was dilated and a 16 cm triple-lumen hemodialysis catheter was advanced over the wire and positioned with the catheter tip at the superior cavoatrial junction. The catheter was aspirated and then flushed with heparinized saline. The catheter was then secured to the skin with 0 Prolene suture and a sterile bandage. The patient tolerated the procedure well. IMPRESSION: Successful placement of a non tunneled hemodialysis catheter via the right internal jugular vein. The tip of the catheter is at the superior cavoatrial junction and the catheter is ready for immediate use. Electronically Signed   By: Jacqulynn Cadet M.D.   On: 01/16/2018 14:05   Dg Chest Portable 1 View  Result Date: 01/09/2018 CLINICAL DATA:  Shortness of breath for 1 day. 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 EXAM: PORTABLE CHEST 1 VIEW COMPARISON:  Radiographs 11/09/2017. Abdominal MRI 12/09/2017. PET-CT 11/25/2015. FINDINGS: 1902 hours. There are right-greater-than-left pleural effusions which have enlarged. There is associated bibasilar pulmonary opacity, likely atelectasis. There are stable postsurgical changes in the right hilar region. No pneumothorax or acute osseous findings are seen. IMPRESSION: Enlarging right-greater-than-left pleural effusions with associated bibasilar pulmonary opacities, likely atelectasis. No invert pulmonary edema. Electronically Signed   By: Richardean Sale M.D.   On: 01/09/2018 19:35    Labs:  CBC: Recent Labs    01/20/18 0718 01/20/18 1007 01/21/18 0509 01/23/18 0333 01/24/18 0750  WBC 15.8*  --  16.1* 14.8* 14.9*  HGB 8.7* 9.5* 8.3* 7.9* 8.0*  HCT 28.3* 28.0* 27.0* 25.6* 25.9*  PLT 375  --  357 363 415*    COAGS: Recent Labs    11/09/17 1703 01/09/18 1651  INR 1.09 1.21    BMP: Recent Labs    01/22/18 0328 01/23/18 0333 01/24/18 0357 01/24/18 0749  NA 136 136 136 136  K 3.8 3.8 3.8 3.7  CL 102 103 101 99  CO2 25 25 27 26   GLUCOSE 110* 110* 112*  111*  BUN 56* 67* 74* 77*  CALCIUM 7.7* 7.4* 7.8* 8.0*  CREATININE 3.36* 3.64* 3.84* 3.77*  GFRNONAA 16* 15* 14* 14*  GFRAA 19* 17* 16* 17*    LIVER FUNCTION TESTS: Recent Labs    01/09/18 1651 01/13/18 0354  01/16/18 0456  01/21/18 0509 01/22/18 0328 01/23/18 0333 01/24/18  0357 01/24/18 0749  BILITOT 0.4 0.5  --  0.4  --  <0.1*  --   --   --   --   AST 56* 40  --  26  --  24  --   --   --   --   ALT 33 41  --  36  --  30  --   --   --   --   ALKPHOS 82 92  --  78  --  74  --   --   --   --   PROT 4.7* 4.7*  --  4.7*  --  4.6*  --   --   --   --   ALBUMIN 1.6* 1.6*   < > 1.9*   < > 1.6* 1.7* 1.6* 1.5* 1.6*   < > = values in this interval not displayed.    TUMOR MARKERS: No results for input(s): AFPTM, CEA, CA199, CHROMGRNA in the last 8760 hours.  Assessment and Plan:  Acute kidney injury. Plan for image-guided random renal biopsy tentatively for 01/25/2018 with Dr. Anselm Pancoast. Patient will be NPO at midnight prior to procedure. Afebrile. Will stop Heparin per IR protocol. INR pending.  Risks and benefits discussed with the patient including, but not limited to bleeding, infection, damage to adjacent structures or low yield requiring additional tests. All of the patient's questions were answered, patient is agreeable to proceed. Consent signed and in chart.   Thank you for this interesting consult.  I greatly enjoyed meeting JAYMIAN BOGART and look forward to participating in their care.  A copy of this report was sent to the requesting provider on this date.  Electronically Signed: Earley Abide, PA-C 01/24/2018, 3:35 PM   I spent a total of 40 Minutes in face to face in clinical consultation, greater than 50% of which was counseling/coordinating care for acute kidney injury.

## 2018-01-24 NOTE — Progress Notes (Signed)
Patient Demographics:    Andrew Guerra, is a 78 y.o. male, DOB - 1940-04-02, CZY:[id number]  Admit date - 01/09/2018   Admitting Physician [name], DO  Outpatient Primary MD for the patient is [name], MD  LOS - 15   Chief Complaint  Patient presents with  . 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 The degree of leftward septal   shift and D shaped septum has worsened. There is now a   trivial-small pericardial effusion. Side by side comparison of   images performed  Brief Summary 78 y.o. male with a PMH of HTN, HLD, bicuspid aortic valve with moderate AS, NSCLC s/p resection and radiation, and PUD admitted from PCPs office on 01/09/2018 with hemoglobin of 6.4, patient with volume overload and anasarca, patient will be initiated on hemodialysis on 01/16/2018 to try to address his volume status, had RHC on 01/19/2018, paroxysmal A. fib currently on amiodarone, volume improved with UF/dialysis. Awaiting 24 hour urine results - may need renal biopsy if there is nephrotic range proteinuria  Plan:- 1)Acute Gi Bleed---  status post EGD on 01/10/2018 with nonbleeding gastric and duodenal ulcers,  continue PPI twice daily, monitor H&H and transfuse as clinically indicated, hemoglobin is  8.0,   Post-transfusion of 2 units of packed cells this admission, monitor closely and transfuse as clinically indicated  2)HFpEF--acute on chronic diastolic dysfunction CHF exacerbation, last known EF 60 to 65% with moderate aortic stenosis... 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  ANA neg.  Liver looks overall ok on CT, volume improved with UF/dialysis.  Scheduled for renal biopsy on 01/25/2018  to try to determine etiology of worsening renal function proteinuria with hypoalbuminemia  4)Acute blood loss Anemia--- secondary to #1 above, admission hemoglobin was 6.4, hemoglobin is 8.0,  post transfusion of 2 units of packed cells,  5) PAFib--- continue oral amiodarone load (started 01/22/2018)----amiodarone p.o. 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  10)Leukocytosis-- ??  Reactive in setting of underlying malignancy/inflammation, infection less likely, no fevers, continue to monitor consider further imaging studies of the chest and abdomen if indicated, white count is > 14  Disposition/Need for in-Hospital Stay- patient unable to be discharged at this time due to   worsening of heart failure, Renal failure and volume overload requiring further diuresis and monitoring (see subjective section above), patient will need additional hemodialysis, still awaiting further clinical and lab data to determine if patient will need long-term hemodialysis in which case --- patient will need outpatient hemodialysis chair/spot prior to discharge----  Code Status : Full  Family Communication:   Wife at bedside  Disposition Plan  : To be determined--nephrology need to determine if patient needs short-term versus long-term hemodialysis after kidney biopsy  Consults  :  Cardiology/GI/Nephrology/IR for HD catheter placement.... Consider hematology consult   DVT Prophylaxis  :  TEDS/ SCDs   Lab Results  Component Value Date   PLT 415 (H) 01/24/2018   Inpatient Medications  Scheduled Meds: . amiodarone  400 mg Oral BID  . carvedilol  3.125 mg Oral BID WC  . Chlorhexidine Gluconate Cloth  6 each Topical Q0600  . [START ON 01/25/2018] feeding supplement (NEPRO CARB STEADY)  237 mL Oral TID BM  . [START ON 01/25/2018] heparin  5,000 Units Subcutaneous Q8H  . mouth rinse  15 mL Mouth Rinse BID  . mometasone-formoterol  2 puff Inhalation BID  . multivitamin with minerals  1 tablet Oral Daily  . Netarsudil-Latanoprost  1 drop Both Eyes QHS  . pantoprazole  40 mg Oral BID  . sodium chloride flush  3 mL Intravenous Q12H   Continuous Infusions: . sodium chloride     PRN Meds:.sodium chloride, acetaminophen **OR** acetaminophen, heparin, lidocaine, ondansetron **OR** ondansetron (ZOFRAN) IV, sodium chloride flush, sodium chloride flush, zolpidem   Anti-infectives (From admission, onward)   Start     Dose/Rate Route Frequency Ordered Stop   01/16/18 1230  ceFAZolin (ANCEF) IVPB 2g/100 mL premix  Note to Pharmacy:  Pre IR procedure, procedure time 12/30 not yet known   2 g 200 mL/hr over 30 Minutes Intravenous To Surgery 01/15/18 1112 01/17/18 1230        Objective:   Vitals:   01/24/18 1030 01/24/18 1100 01/24/18 1135 01/24/18 1249  BP: 124/76 (!) 106/57 131/67 124/67  Pulse: 72 76 76 77  Resp:   (!) 26 17  Temp:   98 F (36.7 C) 98.3 F (36.8 C)  TempSrc:   Oral Oral  SpO2:   97% 91%  Weight:   87.3 kg   Height:        Wt Readings from Last 3 Encounters:  01/24/18 87.3 kg  01/09/18 99.7 kg  12/14/17 88.5 kg    Intake/Output Summary (Last 24 hours) at 01/24/2018 1707 Last data filed at 01/24/2018 1650 Gross per 24 hour  Intake 3 ml  Output 3775 ml  Net -3772 ml    Physical Exam Patient is examined daily including today on 01/24/2018   , exams remain the same as of yesterday except that has changed   Gen:- Awake Alert, able to speak in sentences HEENT:- [place].AT, No sclera icterus Neck-Supple Neck, +ve JVD,.  Right-sided HD catheter Lungs- fair air movement bilaterally, no wheezing  CV- S1, S2 normal, irregular (no longer in A. Fib) 3/6 SM Abd-  +ve B.Sounds, Abd Soft, No tenderness,    Extremity/Skin:- improving edema/anasarca extending from the toes into the lower abdomen, much improved edema penis and scrotum, has TEDs, pedal pulses present  Psych-affect is appropriate, oriented x3  Neuro-generalized weakness, but no new focal deficits, no tremors GU--- much improved scrotal and penile edema, foley per nephrologist  Data Review:   Micro Results No results found for this or any previous visit (from the past 240 hour(s)).  Radiology Reports Ct Abdomen Pelvis Wo Contrast  Result Date: 01/09/2018 CLINICAL DATA:  Low hemoglobin. EXAM: CT ABDOMEN AND PELVIS WITHOUT CONTRAST TECHNIQUE: Multidetector CT imaging of the abdomen and pelvis was performed following the standard protocol without IV contrast. COMPARISON:  CT 02/04/2015, abdomen MRI 12/09/2017 FINDINGS: Lower chest: Small bilateral pleural effusions. Cardiomegaly without pericardial effusion or thickening. Atherosclerosis of the included thoracic aorta without aneurysm. Coronary arteriosclerosis is seen. Pulmonary consolidation/atelectasis in the right middle lobe distribution. Hepatobiliary: Layering biliary sludge within the gallbladder without secondary signs of acute cholecystitis. The unenhanced liver demonstrates a granuloma in the right hepatic lobe. No definite mass or biliary dilatation. Pancreas: Unremarkable. No pancreatic ductal dilatation or surrounding inflammatory changes. Spleen: Normal size spleen with scattered calcifications noted within possibly representing vascular calcifications or granulomata. Adrenals/Urinary Tract: Scattered bilateral hyperdense  lesions of the kidneys are nonspecific but may represent proteinaceous or hemorrhagic cysts, the largest is right-sided and interpolar measuring up 1.6 cm with more ill-defined hyperdensity slightly exophytic off the right kidney. No nephrolithiasis or obstructive uropathy. The urinary bladder is unremarkable for the degree of distention. Stomach/Bowel: Stomach is within normal limits. The appendix is not confidently identified. No evidence of bowel wall thickening, distention, or inflammatory changes. Vascular/Lymphatic: Aortic atherosclerosis. No lymphadenopathy. Reproductive: Prostatomegaly measuring up to 5.7 cm. Other: Diffuse soft tissue anasarca. No ascites or free air. 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 IMPRESSION: 1. Pulmonary consolidation/atelectasis in the right middle lobe with small bilateral pleural effusions. 2. Scattered bilateral hyperdense lesions of the kidneys likely to represent proteinaceous or hemorrhagic cysts, the largest is in the right interpolar and interpolar measuring 1.6 cm with more ill-defined hyperdensity slightly exophytic  off the right kidney. Findings better characterized on recent prior MRI. 3. Hepatic and splenic granulomata. 4. Diffuse soft tissue anasarca. 5. 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 Aortic Atherosclerosis (ICD10-I70.0). Electronically Signed   By: Ashley Royalty M.D.   On: 01/09/2018 20:33   Dg Chest 2 View  Result Date: 01/14/2018 CLINICAL DATA:  Shortness of breath EXAM: CHEST - 2 VIEW COMPARISON:  Chest radiograph 01/09/2018 FINDINGS: Monitoring leads overlie the patient. Stable cardiac and mediastinal contours. Similar-appearing patchy consolidation within the right greater than left lower lungs. Moderate right and small left pleural effusions. No pneumothorax. IMPRESSION: Similar-appearing patchy consolidation within the right greater than left lower lungs with moderate right and small left pleural effusions. Electronically  Signed   By: Lovey Newcomer M.D.   On: 01/14/2018 16:08   US Renal  Result Date: 01/13/2018 CLINICAL DATA:  Oliguria.  Elevated PSA. EXAM: RENAL / URINARY TRACT ULTRASOUND COMPLETE COMPARISON:  CT abdomen and pelvis 01/09/2018. FINDINGS: Right Kidney: Renal measurements: 10.5 x 4.6 x 5.0 cm = volume: 126.8 mL . Echogenicity within normal limits. A simple cyst measuring 1.9 cm in diameter is identified. No hydronephrosis visualized. Trace amount of perihepatic ascites is noted. Left Kidney: Renal measurements: 11.4 x 5.5 x 5.1 cm = volume: 163.2 mL. Echogenicity within normal limits. No mass or hydronephrosis visualized. Bladder: The bladder is almost completely decompressed. No focal abnormality. Prostatomegaly is seen. IMPRESSION: Negative for hydronephrosis or acute abnormality. Prostatomegaly. Trace amount of ascites. Electronically Signed   By: Inge Rise M.D.   On: 01/13/2018 11:29   Ir Fluoro Guide Cv Line Right  Result Date: 01/16/2018 INDICATION: 78 year old male with acute renal failure in need of hemodialysis. We had an initial plan for placement of a tunneled hemodialysis catheter, however he has leukocytosis today as well as new onset atrial fibrillation with uncontrolled rapid ventricular response. Therefore, we will place a non tunneled hemodialysis catheter to allow him to get hemodialysis quickly without the additional stress of moderate sedation. We can then convert the non tunneled catheter to a tunneled device once his other issues have been addressed. EXAM: IR RIGHT FLOURO GUIDE CV LINE; IR ULTRASOUND GUIDANCE VASC ACCESS RIGHT MEDICATIONS: None ANESTHESIA/SEDATION: None FLUOROSCOPY TIME:  Fluoroscopy Time: 0 minutes 12 seconds (1 mGy). COMPLICATIONS: None immediate. PROCEDURE: Informed written consent was obtained from the patient after a thorough discussion of the procedural risks, benefits and alternatives. All questions were addressed. Maximal Sterile Barrier Technique was  utilized including caps, mask, sterile gowns, sterile gloves, sterile drape, hand hygiene and skin antiseptic. A timeout was performed prior to the initiation of the procedure. The right internal jugular vein was interrogated with ultrasound and found to be widely patent. An image was obtained and stored for the medical record. Local anesthesia was attained by infiltration with 1% lidocaine. A small dermatotomy was made. Under real-time sonographic guidance, the vessel was punctured with an 18 gauge needle. A 0.035 wire was advanced into the inferior vena cava. The needle was removed. The skin tract was dilated and a 16 cm triple-lumen hemodialysis catheter was advanced over the wire and positioned with the catheter tip at the superior cavoatrial junction. The catheter was aspirated and then flushed with heparinized saline. The catheter was then secured to the skin with 0 Prolene suture and a sterile bandage. The patient tolerated the procedure well. IMPRESSION: Successful placement of a non tunneled hemodialysis catheter via the right internal jugular vein. The tip of the catheter is at the superior cavoatrial  junction and the catheter is ready for immediate use. Electronically Signed   By: Jacqulynn Cadet M.D.   On: 01/16/2018 14:05   Ir US Guide Vasc Access Right  Result Date: 01/16/2018 INDICATION: 78 year old male with acute renal failure in need of hemodialysis. We had an initial plan for placement of a tunneled hemodialysis catheter, however he has leukocytosis today as well as new onset atrial fibrillation with uncontrolled rapid ventricular response. Therefore, we will place a non tunneled hemodialysis catheter to allow him to get hemodialysis quickly without the additional stress of moderate sedation. We can then convert the non tunneled catheter to a tunneled device once his other issues have been addressed. EXAM: IR RIGHT FLOURO GUIDE CV LINE; IR ULTRASOUND GUIDANCE VASC ACCESS RIGHT MEDICATIONS:  None ANESTHESIA/SEDATION: None FLUOROSCOPY TIME:  Fluoroscopy Time: 0 minutes 12 seconds (1 mGy). COMPLICATIONS: None immediate. PROCEDURE: Informed written consent was obtained from the patient after a thorough discussion of the procedural risks, benefits and alternatives. All questions were addressed. Maximal Sterile Barrier Technique was utilized including caps, mask, sterile gowns, sterile gloves, sterile drape, hand hygiene and skin antiseptic. A timeout was performed prior to the initiation of the procedure. The right internal jugular vein was interrogated with ultrasound and found to be widely patent. An image was obtained and stored for the medical record. Local anesthesia was attained by infiltration with 1% lidocaine. A small dermatotomy was made. Under real-time sonographic guidance, the vessel was punctured with an 18 gauge needle. A 0.035 wire was advanced into the inferior vena cava. The needle was removed. The skin tract was dilated and a 16 cm triple-lumen hemodialysis catheter was advanced over the wire and positioned with the catheter tip at the superior cavoatrial junction. The catheter was aspirated and then flushed with heparinized saline. The catheter was then secured to the skin with 0 Prolene suture and a sterile bandage. The patient tolerated the procedure well. IMPRESSION: Successful placement of a non tunneled hemodialysis catheter via the right internal jugular vein. The tip of the catheter is at the superior cavoatrial junction and the catheter is ready for immediate use. Electronically Signed   By: Jacqulynn Cadet M.D.   On: 01/16/2018 14:05   Dg Chest Portable 1 View  Result Date: 01/09/2018 CLINICAL DATA:  Shortness of breath for 1 day. 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 EXAM: PORTABLE CHEST 1 VIEW COMPARISON:  Radiographs 11/09/2017. Abdominal MRI 12/09/2017. PET-CT 11/25/2015. FINDINGS: 1902 hours. There are right-greater-than-left pleural effusions which have  enlarged. There is associated bibasilar pulmonary opacity, likely atelectasis. There are stable postsurgical changes in the right hilar region. No pneumothorax or acute osseous findings are seen. IMPRESSION: Enlarging right-greater-than-left pleural effusions with associated bibasilar pulmonary opacities, likely atelectasis. No invert pulmonary edema. Electronically Signed   By: Richardean Sale M.D.   On: 01/09/2018 19:35     CBC Recent Labs  Lab 01/19/18 0354 01/20/18 0718 01/20/18 1007 01/21/18 0509 01/23/18 0333 01/24/18 0750  WBC 19.9* 15.8*  --  16.1* 14.8* 14.9*  HGB 9.1* 8.7* 9.5* 8.3* 7.9* 8.0*  HCT 28.9* 28.3* 28.0* 27.0* 25.6* 25.9*  PLT 338 375  --  357 363 415*  MCV 75.3* 77.1*  --  75.6* 75.1* 75.1*  MCH 23.7* 23.7*  --  23.2* 23.2* 23.2*  MCHC 31.5 30.7  --  30.7 30.9 30.9  RDW 21.7* 22.2*  --  22.0* 22.5* 22.4*    Chemistries  Recent Labs  Lab 01/21/18 0509 01/22/18 0328 01/23/18  5784 01/24/18 0357 01/24/18 0749  NA 138 136 136 136 136  K 3.6 3.8 3.8 3.8 3.7  CL 102 102 103 101 99  CO2 27 25 25 27 26   GLUCOSE 107* 110* 110* 112* 111*  BUN 41* 56* 67* 74* 77*  CREATININE 2.74* 3.36* 3.64* 3.84* 3.77*  CALCIUM 7.6* 7.7* 7.4* 7.8* 8.0*  AST 24  --   --   --   --   ALT 30  --   --   --   --   ALKPHOS 74  --   --   --   --   BILITOT <0.1*  --   --   --   --     Lab Results  Component Value Date   HGBA1C 5.0 01/14/2018   -----------------------------------------------------------------------------------------------------------------    Component Value Date/Time   BNP 598.0 (H) 01/09/2018 1205   Shonnie Poudrier M.D on 01/24/2018 at 5:07 PM  Pager---[phone] Go to [email or web address] - password [email or web address] for contact info  [facility] - Office  [phone]

## 2018-01-25 ENCOUNTER — Inpatient Hospital Stay (HOSPITAL_COMMUNITY): Payer: Medicare Other

## 2018-01-25 ENCOUNTER — Encounter (INDEPENDENT_AMBULATORY_CARE_PROVIDER_SITE_OTHER): Payer: Medicare Other | Admitting: Ophthalmology

## 2018-01-25 DIAGNOSIS — I480 Paroxysmal atrial fibrillation: Secondary | ICD-10-CM

## 2018-01-25 LAB — RENAL FUNCTION PANEL
Albumin: 1.6 g/dL — ABNORMAL LOW (ref 3.5–5.0)
Anion gap: 8 (ref 5–15)
BUN: 43 mg/dL — ABNORMAL HIGH (ref 8–23)
CO2: 27 mmol/L (ref 22–32)
Calcium: 7.8 mg/dL — ABNORMAL LOW (ref 8.9–10.3)
Chloride: 102 mmol/L (ref 98–111)
Creatinine, Ser: 3.13 mg/dL — ABNORMAL HIGH (ref 0.61–1.24)
GFR calc Af Amer: 21 mL/min — ABNORMAL LOW (ref >60)
GFR calc non Af Amer: 18 mL/min — ABNORMAL LOW (ref >60)
GLUCOSE: 106 mg/dL — AB (ref 70–99)
Phosphorus: 2.7 mg/dL (ref 2.5–4.6)
Potassium: 3.6 mmol/L (ref 3.5–5.1)
Sodium: 137 mmol/L (ref 135–145)

## 2018-01-25 LAB — RPR: RPR Ser Ql: NONREACTIVE

## 2018-01-25 LAB — C4 COMPLEMENT: Complement C4, Body Fluid: 21 mg/dL (ref 14–44)

## 2018-01-25 LAB — GLOMERULAR BASEMENT MEMBRANE ANTIBODIES: GBM Ab: 4 units (ref 0–20)

## 2018-01-25 LAB — RHEUMATOID FACTOR: Rheumatoid fact SerPl-aCnc: 15.5 IU/mL — ABNORMAL HIGH (ref 0.0–13.9)

## 2018-01-25 LAB — PROTIME-INR
INR: 1.23
Prothrombin Time: 15.3 seconds — ABNORMAL HIGH (ref 11.4–15.2)

## 2018-01-25 LAB — ANCA TITERS
Atypical P-ANCA titer: <1:20 {titer}
C-ANCA: <1:20 {titer}
P-ANCA: <1:20 {titer}

## 2018-01-25 LAB — C3 COMPLEMENT: C3 Complement: 93 mg/dL (ref 82–167)

## 2018-01-25 MED ORDER — MIDAZOLAM HCL 2 MG/2ML IJ SOLN
INTRAMUSCULAR | Status: AC
  Filled 2018-01-25: qty 4

## 2018-01-25 MED ORDER — LIDOCAINE HCL (PF) 1 % IJ SOLN
INTRAMUSCULAR | Status: AC
  Filled 2018-01-25: qty 30

## 2018-01-25 MED ORDER — FENTANYL CITRATE (PF) 100 MCG/2ML IJ SOLN
INTRAMUSCULAR | Status: AC | PRN
  Administered 2018-01-25: 50 ug via INTRAVENOUS

## 2018-01-25 MED ORDER — MIDAZOLAM HCL 2 MG/2ML IJ SOLN
INTRAMUSCULAR | Status: AC | PRN
  Administered 2018-01-25: 1 mg via INTRAVENOUS

## 2018-01-25 MED ORDER — FENTANYL CITRATE (PF) 100 MCG/2ML IJ SOLN
INTRAMUSCULAR | Status: AC
  Filled 2018-01-25: qty 4

## 2018-01-25 NOTE — Progress Notes (Signed)
PROGRESS NOTE    Andrew Guerra   XFG:[id number]  DOB: October 10, 1940  DOA: 01/09/2018 PCP: [name], MD   Brief Narrative:  Andrew Guerra of HTN, HLD, bicuspid aortic valve with moderate AS, NSCLC s/p resection and radiation,HTN and PUD (duodenal and gastric ulcers) admitted from PCPs office on 01/09/2018 with hemoglobin of 6.4. He was hemoccult + in ED.  Also noted to be fluid overloaded and started on lasix.    patient with volume overload and anasarca, patient will be initiated on hemodialysis on 01/16/2018 to try to address his volume status, had RHC on 01/19/2018, paroxysmal A. fib currently on amiodarone, volume improved with UF/dialysis.   Subjective: He has no complaints. Seen after biopsy.     Assessment & Plan:   Principal Problem:  Hemoccult + - noted to be hemoccult + with anemia on admission - EGD 01/10/18 with gastric and duodenal ulcers - on BID PPI - pathology shows> CHRONIC GASTRITIS AND INTESTINAL METAPLASIA  Active Problems: Anemia  - due to chronic bleeding - s/p 2 U PRBC   AKI on  CKD 3 - fluid overload with acute on chronic diastolic - See ECHO below- has normal EF and grade 1 d CHF - needed to start dialysis on 12/30 as his volume overload did not respond to a Lasix infusion - s/p right renal biopsy today  Episodes of SVT, h/o PAF - has had a cardiology eval - recommended plan for Amiodarone>   400 mg twice a day (starting 01/22/2018), then 400 twice daily for 1 week, then 200 twice daily for 2 weeks and then 200 once a day thereafter - cont Coreg 3.125 mg bid  -  no anticoagulation due to PUD  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 Scheduled to see hematology outpatient but not until 03/2018--- be judicious with blood transfusion      Moderate 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 - follow  NSCLC -  post prior surgery and radiation       Malnutrition of moderate degree    - cont  supplements    Time spent in minutes: 35  DVT prophylaxis: SCDS Code Status: Full code Family Communication: wife at bedside  Disposition Plan:    Consultants:   Cardiology  GI  Nephrology  IR Procedures:   EGD 12/24 LA Class A reflux esophagitis. - Non-bleeding gastric ulcers with no stigmata of bleeding. Biopsied. - Recent diagnosis of intestinal metaplasia and H pylori. Biopsies obtained for mapping and to document response to therapy, respectively. - Multiple non-bleeding duodenal ulcers with no stigmata of bleeding.  TTE 01/13/18 Impressions: EF 60 to 65% without regional wall motion normalities, - Compared to the exam from 10/24/17, the degree of right ventricular dilation has increased, and right ventricular systolic function has decreased. The degree of leftward septal shift and D shaped septum has worsened. There is now a trivial-small pericardial effusion. Side by side comparison of images performed  Antimicrobials:  Anti-infectives (From admission, onward)   Start     Dose/Rate Route Frequency Ordered Stop   01/16/18 1230  ceFAZolin (ANCEF) IVPB 2g/100 mL premix    Note to Pharmacy:  Pre IR procedure, procedure time 12/30 not yet known   2 g 200 mL/hr over 30 Minutes Intravenous To Surgery 01/15/18 1112 01/17/18 1230       Objective: Vitals:   01/25/18 1203 01/25/18 1224 01/25/18 1257 01/25/18 1328  BP: (!) 105/55 103/60 104/60 (!) 106/59  Pulse: 62 64 (!) 57 62  Resp: 16 16 16 16   Temp:      TempSrc:      SpO2: 95% 95% 95% 96%  Weight:      Height:        Intake/Output Summary (Last 24 hours) at 01/25/2018 1626 Last data filed at 01/25/2018 0912 Gross per 24 hour  Intake 3 ml  Output 75 ml  Net -72 ml   Filed Weights   01/24/18 0554 01/24/18 0655 01/24/18 1135  Weight: 92.3 kg 90.9 kg 87.3 kg    Examination: General exam: Appears comfortable  HEENT: PERRLA, oral mucosa moist, no sclera icterus or thrush Respiratory system:  Clear to auscultation. Respiratory effort normal. Cardiovascular system: S1 & S2 heard, RRR.   Gastrointestinal system: Abdomen soft, non-tender, nondistended. Normal bowel sounds. Central nervous system: Alert and oriented. No focal neurological deficits. Extremities: No cyanosis, clubbing or edema Skin: No rashes or ulcers Psychiatry:  Mood & affect appropriate.     Data Reviewed: I have personally reviewed following labs and imaging studies  CBC: Recent Labs  Lab 01/19/18 0354 01/20/18 0718 01/20/18 1007 01/21/18 0509 01/23/18 0333 01/24/18 0750  WBC 19.9* 15.8*  --  16.1* 14.8* 14.9*  HGB 9.1* 8.7* 9.5* 8.3* 7.9* 8.0*  HCT 28.9* 28.3* 28.0* 27.0* 25.6* 25.9*  MCV 75.3* 77.1*  --  75.6* 75.1* 75.1*  PLT 338 375  --  357 363 619*   Basic Metabolic Panel: Recent Labs  Lab 01/22/18 0328 01/23/18 0333 01/24/18 0357 01/24/18 0749 01/25/18 0346  NA 136 136 136 136 137  K 3.8 3.8 3.8 3.7 3.6  CL 102 103 101 99 102  CO2 25 25 27 26 27   GLUCOSE 110* 110* 112* 111* 106*  BUN 56* 67* 74* 77* 43*  CREATININE 3.36* 3.64* 3.84* 3.77* 3.13*  CALCIUM 7.7* 7.4* 7.8* 8.0* 7.8*  PHOS 2.7 2.8 3.2 3.2 2.7   GFR: Estimated Creatinine Clearance: 20.1 mL/min (A) (by C-G formula based on SCr of 3.13 mg/dL (H)). Liver Function Tests: Recent Labs  Lab 01/21/18 0509 01/22/18 0328 01/23/18 0333 01/24/18 0357 01/24/18 0749 01/25/18 0346  AST 24  --   --   --   --   --   ALT 30  --   --   --   --   --   ALKPHOS 74  --   --   --   --   --   BILITOT <0.1*  --   --   --   --   --   PROT 4.6*  --   --   --   --   --   ALBUMIN 1.6* 1.7* 1.6* 1.5* 1.6* 1.6*   No results for input(s): LIPASE, AMYLASE in the last 168 hours. No results for input(s): AMMONIA in the last 168 hours. Coagulation Profile: Recent Labs  Lab 01/25/18 0346  INR 1.23   Cardiac Enzymes: No results for input(s): CKTOTAL, CKMB, CKMBINDEX, TROPONINI in the last 168 hours. BNP (last 3 results) No results for  input(s): PROBNP in the last 8760 hours. HbA1C: No results for input(s): HGBA1C in the last 72 hours. CBG: No results for input(s): GLUCAP in the last 168 hours. Lipid Profile: No results for input(s): CHOL, HDL, LDLCALC, TRIG, CHOLHDL, LDLDIRECT in the last 72 hours. Thyroid Function Tests: No results for input(s): TSH, T4TOTAL, FREET4, T3FREE, THYROIDAB in the last 72 hours. Anemia Panel: No results for input(s): VITAMINB12, FOLATE, FERRITIN, TIBC, IRON, RETICCTPCT in  the last 72 hours. Urine analysis:    Component Value Date/Time   COLORURINE YELLOW 01/13/2018 1058   APPEARANCEUR HAZY (A) 01/13/2018 1058   LABSPEC 1.014 01/13/2018 1058   PHURINE 5.0 01/13/2018 1058   GLUCOSEU NEGATIVE 01/13/2018 1058   HGBUR NEGATIVE 01/13/2018 1058   BILIRUBINUR NEGATIVE 01/13/2018 1058   KETONESUR NEGATIVE 01/13/2018 1058   PROTEINUR 100 (A) 01/13/2018 1058   NITRITE NEGATIVE 01/13/2018 1058   LEUKOCYTESUR NEGATIVE 01/13/2018 1058   Sepsis Labs: @LABRCNTIP (procalcitonin:4,lacticidven:4) )No results found for this or any previous visit (from the past 240 hour(s)).       Radiology Studies: No results found.    Scheduled Meds: . amiodarone  400 mg Oral BID  . carvedilol  3.125 mg Oral BID WC  . Chlorhexidine Gluconate Cloth  6 each Topical Q0600  . feeding supplement (NEPRO CARB STEADY)  237 mL Oral TID BM  . fentaNYL      . heparin  5,000 Units Subcutaneous Q8H  . lidocaine (PF)      . mouth rinse  15 mL Mouth Rinse BID  . midazolam      . mometasone-formoterol  2 puff Inhalation BID  . multivitamin with minerals  1 tablet Oral Daily  . Netarsudil-Latanoprost  1 drop Both Eyes QHS  . pantoprazole  40 mg Oral BID  . sodium chloride flush  3 mL Intravenous Q12H   Continuous Infusions: . sodium chloride       LOS: 16 days      Debbe Odea, MD [facility] Pager: [email or web address] Password [facility] 01/25/2018, 4:26 PM

## 2018-01-25 NOTE — Procedures (Signed)
Interventional Radiology Procedure:   Indications: AKI  Procedure: US guided right kidney biopsy  Findings: 2 cores from right kidney lower pole  Complications: None     EBL: Minimal  Plan: Bedrest 4 hours   Sheridan Hew R. Anselm Pancoast, MD  Pager: [phone]

## 2018-01-25 NOTE — Sedation Documentation (Signed)
Patient denies pain and is resting comfortably.  

## 2018-01-25 NOTE — Sedation Documentation (Signed)
Epic would not allow a new sedation until end time completed.

## 2018-01-25 NOTE — Progress Notes (Signed)
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 Found to be markedly fluid overloaded with increasing bilateral pleural effusions and lower extremity edema felt to be related to acute on chronic diastolic congestive heart failure. Poor resp to IV Lasix, and had worsening renal function prompting dialysis initiation. He did have a prior work-up that was concerning for hemochromatosis per MRI in November 2018. He also noted some urinary hesitancy and weak stream.  1. Chronic kidney disease stage III with a baseline serum creatinine around 1.7-1.8. His serum creatinine worsened in December to 2.6. He is a urine protein to creatinine ratio of 1.4 g. Serologies so far have been negative. He required dialysis initiation on 12/30 for worsening azotemia and continued peripheral volume overload not responsive to Lasix drip. His last dialysis treatment was on Saturday. A repeat urine protein to creatinine ratio is pending to evaluate for possible nephrotic syndrome. We will plan for dialysis on a Tuesday, Thursday, Saturday dialysis schedule. UPC 1.5 w/ neg SPEP and very low Albumin. - Serologies ordered - ANCA, C3C4 (NL), RF (min elev), RPR, antiGBM have been sent off - ANA neg in 12/2017 - Would like to biopsy as this is fairly rapid progression of this CKD. Prefer not to assume this is from DM and CHF until proven otherwise. Certainly can see this degree of proteinuria w/ DM --> appreciate IR taking care of this pt with planned biopsy today.  Next HD Thur  2. Acute on chronic heart failure with preserved ejection fraction. He has underlying RV dysfunction. Diuretics are on hold due to lack of efficacy. Will plan ultrafiltration on dialysis.  3. SVT  with ultrafiltration. Will monitor electrolytes closely.  4. Acute hypoxic respiratory failure. Improving with volume removal on dialysis.  5. Acute GI bleed. Status post GI eval and packed RBCs.  6. Anemia secondary to acute blood loss. Continue packed RBCs as necessary.  6. Urinary hesitancy. Eventually will need void trial.  Subjective:   Denies f/ c/n/v/dyspnea/cp. Tolerated HD yest. No events overnight.   Objective:   BP (!) 105/55 (BP Location: Left Arm)   Pulse 68   Temp (!) 97.5 F (36.4 C) (Oral)   Resp 18   Ht 5' 10.5" (1.791 m)   Wt 87.3 kg   SpO2 90%   BMI 27.23 kg/m   Intake/Output Summary (Last 24 hours) at 01/25/2018 0754 Last data filed at 01/25/2018 3710 Gross per 24 hour  Intake 3 ml  Output 3575 ml  Net -3572 ml   Weight change: -5 kg  Physical Exam: General: AAOx3 NAD, pleasant CV: Heart RRR  Lungs: L/S CTA bilaterally Abd: abd SNT/ND with normal BS GU: Foley to gravity Extremities: 3+ bilateral lower extremity edema  Imaging: No results found.  Labs: BMET Recent Labs  Lab 01/20/18 0438 01/20/18 1007 01/21/18 0509 01/22/18 0328 01/23/18 0333 01/24/18 0357 01/24/18 0749 01/25/18 0346  NA 138 137 138 136 136 136 136 137  K 3.6 3.6 3.6 3.8 3.8 3.8 3.7 3.6  CL 102 97* 102 102 103 101 99 102  CO2 27  --  27 25 25 27 26 27   GLUCOSE 111* 121* 107* 110* 110* 112* 111* 106*  BUN 62* 26* 41* 56* 67* 74* 77* 43*  CREATININE 3.24* 1.50* 2.74* 3.36* 3.64*  3.84* 3.77* 3.13*  CALCIUM 7.8*  --  7.6* 7.7* 7.4* 7.8* 8.0* 7.8*  PHOS 3.4  --  2.5 2.7 2.8 3.2 3.2 2.7   CBC Recent Labs  Lab 01/20/18 0718 01/20/18 1007 01/21/18 0509 01/23/18 0333 01/24/18 0750  WBC 15.8*  --  16.1* 14.8* 14.9*  HGB 8.7* 9.5* 8.3* 7.9* 8.0*  HCT 28.3* 28.0* 27.0* 25.6* 25.9*  MCV 77.1*  --  75.6* 75.1* 75.1*  PLT 375  --  357 363 415*    Medications:    . amiodarone  400 mg Oral BID  . carvedilol  3.125 mg Oral BID WC  . Chlorhexidine  Gluconate Cloth  6 each Topical Q0600  . feeding supplement (NEPRO CARB STEADY)  237 mL Oral TID BM  . heparin  5,000 Units Subcutaneous Q8H  . mouth rinse  15 mL Mouth Rinse BID  . mometasone-formoterol  2 puff Inhalation BID  . multivitamin with minerals  1 tablet Oral Daily  . Netarsudil-Latanoprost  1 drop Both Eyes QHS  . pantoprazole  40 mg Oral BID  . sodium chloride flush  3 mL Intravenous Q12H      Otelia Santee, MD 01/25/2018, 7:54 AM

## 2018-01-26 LAB — RENAL FUNCTION PANEL
Albumin: 1.7 g/dL — ABNORMAL LOW (ref 3.5–5.0)
Anion gap: 11 (ref 5–15)
BUN: 55 mg/dL — ABNORMAL HIGH (ref 8–23)
CO2: 24 mmol/L (ref 22–32)
Calcium: 8.1 mg/dL — ABNORMAL LOW (ref 8.9–10.3)
Chloride: 102 mmol/L (ref 98–111)
Creatinine, Ser: 3.78 mg/dL — ABNORMAL HIGH (ref 0.61–1.24)
GFR calc Af Amer: 17 mL/min — ABNORMAL LOW (ref >60)
GFR calc non Af Amer: 14 mL/min — ABNORMAL LOW (ref >60)
Glucose, Bld: 110 mg/dL — ABNORMAL HIGH (ref 70–99)
Phosphorus: 3.8 mg/dL (ref 2.5–4.6)
Potassium: 3.5 mmol/L (ref 3.5–5.1)
Sodium: 137 mmol/L (ref 135–145)

## 2018-01-26 LAB — CBC
HCT: 26.9 % — ABNORMAL LOW (ref 39.0–52.0)
HEMOGLOBIN: 8.5 g/dL — AB (ref 13.0–17.0)
MCH: 23.2 pg — ABNORMAL LOW (ref 26.0–34.0)
MCHC: 31.6 g/dL (ref 30.0–36.0)
MCV: 73.3 fL — ABNORMAL LOW (ref 80.0–100.0)
Platelets: 422 10*3/uL — ABNORMAL HIGH (ref 150–400)
RBC: 3.67 MIL/uL — ABNORMAL LOW (ref 4.22–5.81)
RDW: 22.5 % — ABNORMAL HIGH (ref 11.5–15.5)
WBC: 30.8 10*3/uL — ABNORMAL HIGH (ref 4.0–10.5)
nRBC: 0 % (ref 0.0–0.2)

## 2018-01-26 MED ORDER — HEPARIN SODIUM (PORCINE) 1000 UNIT/ML DIALYSIS
1000.0000 [IU] | INTRAMUSCULAR | Status: DC | PRN
  Administered 2018-01-26: 1000 [IU] via INTRAVENOUS_CENTRAL
  Administered 2018-01-28: 3200 [IU] via INTRAVENOUS_CENTRAL
  Filled 2018-01-26 (×2): qty 1

## 2018-01-26 MED ORDER — HEPARIN SODIUM (PORCINE) 1000 UNIT/ML IJ SOLN
INTRAMUSCULAR | Status: AC
  Administered 2018-01-26: 1000 [IU] via INTRAVENOUS_CENTRAL
  Filled 2018-01-26: qty 3

## 2018-01-26 NOTE — Telephone Encounter (Signed)
Yes, it is okay.  We are not following him in the hospital now but he has a multitude of other medical things going on. Looks like hgb stable right now.  Can you just put this aside and call him in a couple of weeks to come in for H&H?  Thanks

## 2018-01-26 NOTE — Progress Notes (Signed)
Pt bed pad found to be covered in mucous. Pt sat on BSC and expelled a small amount of clear mucous. MD notified. Will notify RN in handoff.

## 2018-01-26 NOTE — Telephone Encounter (Signed)
Patient is still hospitalized. Is GI still following him?

## 2018-01-26 NOTE — Progress Notes (Addendum)
Call MD Augustin Coupe (neph) at 1630 to notify of bladder scan of 115 mL at 1616 and patient fluid intake of approximately 1L today. Confirmed fluid restriction of 1200 mL. No actions taken; will continue to monitor. Patient informed MD Augustin Coupe of loose mucous stools; MD aware.   Pt.s O2 had been increased to 3L could not confirm who adjusted O2 from 2.5L. Turned down to 2.5L at 1320. Patient SpO2 89% on 2.5L. Increased nasal cannula to 3L at 1531. Will continue to monitor.   Notified by telemetry at 1905 of patient in Afib (patient in HD). Called HD to verify patient's status; HD nurse stated patient not symptomatic and would call back once VS assessed. VS per HD nurse 98.3 F, BP 156/70, HR 80, RR 29 and 93% on 3L; not symptomatic. MD Bodenheimer notified via page at 2039. Per MD informed night nurse to notify MD if returns to Afib

## 2018-01-26 NOTE — Progress Notes (Signed)
HD tx completed @ 2210 w/o problem UF goal met Blood rinsed back VSS Report called to: attempted to call report to primary nurse but she is unavailable to take report at this time, let secretary know to let her know that pt may arrive back to room before I am able to attempt to call report again d/t other needs in HD unit

## 2018-01-26 NOTE — Progress Notes (Signed)
HD tx initiated via HD cath w/o problem Bilat ports: pull/push/flush equally w/o problem VSS Will continue to monitor while on HD tx 

## 2018-01-26 NOTE — Telephone Encounter (Signed)
Noted. Recall in staff messages entered for 2 weeks.

## 2018-01-26 NOTE — Progress Notes (Signed)
PROGRESS NOTE    Andrew Guerra   RWE:[id number]  DOB: [birth year]  DOA: 01/09/2018 PCP: Andrew Drown, MD   Brief Narrative:  Andrew Guerra of HTN, HLD, bicuspid aortic valve with moderate AS, NSCLC s/p resection and radiation,HTN and PUD (duodenal and gastric ulcers) admitted from PCPs office on 01/09/2018 with hemoglobin of 6.4. He was hemoccult + in ED.  Also noted to be fluid overloaded and started on lasix. Continued to have renal failure and fluid overload not responding to Lasix and started on HD 12/30. Course complicated by PAF.    Subjective: He has no complaints today.   Assessment & Plan:   Principal Problem:  Hemoccult + - noted to be hemoccult + with anemia on admission - EGD 01/10/18 with gastric and duodenal ulcers - on BID PPI - pathology shows> CHRONIC GASTRITIS AND INTESTINAL METAPLASIA  Active Problems: Anemia  - due to chronic bleeding - s/p 2 U PRBC  AKI on  CKD 3  - fluid overload with acute on chronic diastolic CHF and moderate RV dysfunction - See ECHO below- has normal EF and grade 1 d CHF - needed to start dialysis on 12/30 as his volume overload did not respond to a Lasix infusion - s/p right renal biopsy 01/25/18- awaiting results  Episodes of SVT, h/o PAF CHA2DS2-VASc Score 3 - has had a cardiology eval - recommended plan for Amiodarone>   400 mg twice a day x 1 wk (starting 01/22/2018),  then 200 twice daily for 2 weeks (start 1/11)and then 200 once a day thereafter - cont Coreg 3.125 mg bid  -  no anticoagulation due to PUD  Hemachromatosis? 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 Scheduled to see hematology outpatient but not until 03/2018--- be judicious with blood transfusion      Moderate 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 - stable per cardiology  NSCLC -  post prior surgery and radiation       Malnutrition of moderate degree    - cont supplements    Time spent in minutes: 35  DVT  prophylaxis: SCDS Code Status: Full code Family Communication: wife at bedside  Disposition Plan:    Consultants:   Cardiology  GI  Nephrology  IR Procedures:   EGD 12/24 LA Class A reflux esophagitis. - Non-bleeding gastric ulcers with no stigmata of bleeding. Biopsied. - Recent diagnosis of intestinal metaplasia and H pylori. Biopsies obtained for mapping and to document response to therapy, respectively. - Multiple non-bleeding duodenal ulcers with no stigmata of bleeding.  TTE 01/13/18 Impressions: EF 60 to 65% without regional wall motion normalities, - Compared to the exam from 10/24/17, the degree of right ventricular dilation has increased, and right ventricular systolic function has decreased. The degree of leftward septal shift and D shaped septum has worsened. There is now a trivial-small pericardial effusion. Side by side comparison of images performed  Antimicrobials:  Anti-infectives (From admission, onward)   Start     Dose/Rate Route Frequency Ordered Stop   01/16/18 1230  ceFAZolin (ANCEF) IVPB 2g/100 mL premix    Note to Pharmacy:  Pre IR procedure, procedure time 12/30 not yet known   2 g 200 mL/hr over 30 Minutes Intravenous To Surgery 01/15/18 1112 01/17/18 1230       Objective: Vitals:   01/26/18 0819 01/26/18 1433 01/26/18 1528 01/26/18 1531  BP:  (!) 106/50    Pulse:  79    Resp:  16    Temp:  99.2 F (37.3 C)    TempSrc:      SpO2: 95% 95% (!) 89% 92%  Weight:      Height:       No intake or output data in the 24 hours ending 01/26/18 1543 Filed Weights   01/24/18 0554 01/24/18 0655 01/24/18 1135  Weight: 92.3 kg 90.9 kg 87.3 kg    Examination: General exam: Appears comfortable  HEENT: PERRLA, oral mucosa moist, no sclera icterus or thrush Respiratory system: Clear to auscultation. Respiratory effort normal. Cardiovascular system: S1 & S2 heard,  No murmurs  Gastrointestinal system: Abdomen soft, non-tender,  nondistended. Normal bowel sound. No organomegaly Central nervous system: Alert and oriented. No focal neurological deficits. Extremities: No cyanosis, clubbing - severe pedal dema Skin: No rashes or ulcers Psychiatry:  Mood & affect appropriate.     Data Reviewed: I have personally reviewed following labs and imaging studies  CBC: Recent Labs  Lab 01/20/18 0718 01/20/18 1007 01/21/18 0509 01/23/18 0333 01/24/18 0750  WBC 15.8*  --  16.1* 14.8* 14.9*  HGB 8.7* 9.5* 8.3* 7.9* 8.0*  HCT 28.3* 28.0* 27.0* 25.6* 25.9*  MCV 77.1*  --  75.6* 75.1* 75.1*  PLT 375  --  357 363 379*   Basic Metabolic Panel: Recent Labs  Lab 01/23/18 0333 01/24/18 0357 01/24/18 0749 01/25/18 0346 01/26/18 0407  NA 136 136 136 137 137  K 3.8 3.8 3.7 3.6 3.5  CL 103 101 99 102 102  CO2 25 27 26 27 24   GLUCOSE 110* 112* 111* 106* 110*  BUN 67* 74* 77* 43* 55*  CREATININE 3.64* 3.84* 3.77* 3.13* 3.78*  CALCIUM 7.4* 7.8* 8.0* 7.8* 8.1*  PHOS 2.8 3.2 3.2 2.7 3.8   GFR: Estimated Creatinine Clearance: 16.6 mL/min (A) (by C-G formula based on SCr of 3.78 mg/dL (H)). Liver Function Tests: Recent Labs  Lab 01/21/18 0509  01/23/18 0333 01/24/18 0357 01/24/18 0749 01/25/18 0346 01/26/18 0407  AST 24  --   --   --   --   --   --   ALT 30  --   --   --   --   --   --   ALKPHOS 74  --   --   --   --   --   --   BILITOT <0.1*  --   --   --   --   --   --   PROT 4.6*  --   --   --   --   --   --   ALBUMIN 1.6*   < > 1.6* 1.5* 1.6* 1.6* 1.7*   < > = values in this interval not displayed.   No results for input(s): LIPASE, AMYLASE in the last 168 hours. No results for input(s): AMMONIA in the last 168 hours. Coagulation Profile: Recent Labs  Lab 01/25/18 0346  INR 1.23   Cardiac Enzymes: No results for input(s): CKTOTAL, CKMB, CKMBINDEX, TROPONINI in the last 168 hours. BNP (last 3 results) No results for input(s): PROBNP in the last 8760 hours. HbA1C: No results for input(s): HGBA1C in  the last 72 hours. CBG: No results for input(s): GLUCAP in the last 168 hours. Lipid Profile: No results for input(s): CHOL, HDL, LDLCALC, TRIG, CHOLHDL, LDLDIRECT in the last 72 hours. Thyroid Function Tests: No results for input(s): TSH, T4TOTAL, FREET4, T3FREE, THYROIDAB in the last 72 hours. Anemia Panel: No results for input(s): VITAMINB12, FOLATE, FERRITIN, TIBC,  IRON, RETICCTPCT in the last 72 hours. Urine analysis:    Component Value Date/Time   COLORURINE YELLOW 01/13/2018 1058   APPEARANCEUR HAZY (A) 01/13/2018 1058   LABSPEC 1.014 01/13/2018 1058   PHURINE 5.0 01/13/2018 1058   GLUCOSEU NEGATIVE 01/13/2018 1058   HGBUR NEGATIVE 01/13/2018 1058   BILIRUBINUR NEGATIVE 01/13/2018 1058   KETONESUR NEGATIVE 01/13/2018 1058   PROTEINUR 100 (A) 01/13/2018 1058   NITRITE NEGATIVE 01/13/2018 1058   LEUKOCYTESUR NEGATIVE 01/13/2018 1058   Sepsis Labs: @LABRCNTIP (procalcitonin:4,lacticidven:4) )No results found for this or any previous visit (from the past 240 hour(s)).       Radiology Studies: US Biopsy (kidney)  Result Date: 01/25/2018 INDICATION: 78 year old with acute on chronic renal failure. Request for renal biopsy. EXAM: ULTRASOUND-GUIDED RIGHT RENAL BIOPSY MEDICATIONS: None. ANESTHESIA/SEDATION: Moderate (conscious) sedation was employed during this procedure. A total of Versed 1.0 mg and Fentanyl 50 mcg was administered intravenously. Moderate Sedation Time: 11 minutes. The patient's level of consciousness and vital signs were monitored continuously by radiology nursing throughout the procedure under my direct supervision. FLUOROSCOPY TIME:  None COMPLICATIONS: None immediate. PROCEDURE: Informed written consent was obtained from the patient after a thorough discussion of the procedural risks, benefits and alternatives. All questions were addressed. Both kidneys were evaluated with ultrasound. The right kidney was targeted for biopsy. Right flank was prepped with  chlorhexidine and sterile field was created. Maximal barrier sterile technique was utilized including mask, sterile gowns, sterile gloves, sterile drape, hand hygiene and skin antiseptic. Skin was anesthetized with 1% lidocaine. 11 gauge core biopsy needle was directed into the right kidney lower pole with ultrasound guidance. Two core biopsies were obtained. Core biopsies appeared adequate and placed in saline. Bandage placed over the puncture site. FINDINGS: Negative for hydronephrosis. Core biopsies obtained from the right kidney lower pole. No significant bleeding or hematoma formation at the end of the procedure. IMPRESSION: Ultrasound-guided core biopsies of the right kidney lower pole. Electronically Signed   By: Markus Daft M.D.   On: 01/25/2018 17:13      Scheduled Meds: . amiodarone  400 mg Oral BID  . carvedilol  3.125 mg Oral BID WC  . Chlorhexidine Gluconate Cloth  6 each Topical Q0600  . feeding supplement (NEPRO CARB STEADY)  237 mL Oral TID BM  . heparin  5,000 Units Subcutaneous Q8H  . mouth rinse  15 mL Mouth Rinse BID  . mometasone-formoterol  2 puff Inhalation BID  . multivitamin with minerals  1 tablet Oral Daily  . Netarsudil-Latanoprost  1 drop Both Eyes QHS  . pantoprazole  40 mg Oral BID  . sodium chloride flush  3 mL Intravenous Q12H   Continuous Infusions: . sodium chloride       LOS: 17 days      Debbe Odea, MD [facility] Pager: [email or web address] Password [facility] 01/26/2018, 3:43 PM

## 2018-01-26 NOTE — Progress Notes (Signed)
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 Found to be markedly fluid overloaded with increasing bilateral pleural effusions and lower extremity edema felt to be related to acute on chronic diastolic congestive heart failure. Poor resp to IV Lasix, and had worsening renal function prompting dialysis initiation. He did have a prior work-up that was concerning for hemochromatosis per MRI in November 2018. He also noted some urinary hesitancy and weak stream.  1. Chronic kidney disease stage III with a baseline serum creatinine around 1.7-1.8. His serum creatinine worsened in December to 2.6. He is a urine protein to creatinine ratio of 1.4 g. Serologies so far have been negative. He required dialysis initiation on 12/30 for worsening azotemia and continued peripheral volume overload not responsive to Lasix drip. His last dialysis treatment was on Saturday. A repeat urine protein to creatinine ratio is pending to evaluate for possible nephrotic syndrome. We will plan for dialysis on a Tuesday, Thursday, Saturday dialysis schedule.UPC 1.5 w/ neg SPEP and very low Albumin. - Serologies ordered- ANCA neg, C3C4 (NL), RF (min elev), antiGBM neg - ANA neg in 12/2017  -  Appreciate Dr. Anselm Pancoast performing the biopsy 1/8 with 2 cores. No hematuria noted.  - We should have prelim light microscopy results tomorrow.  - Next HD today  2. Acute on chronic heart failure with preserved ejection fraction. He has underlying RV dysfunction. Diuretics are on hold due to lack of efficacy. Will plan ultrafiltration on dialysis.  3. SVT with ultrafiltration. Will monitor electrolytes closely.  4. Acute hypoxic respiratory failure. Improving with volume  removal on dialysis.  5. Acute GI bleed. Status post GI eval and packed RBCs.  6. Anemia secondary to acute blood loss. Continue packed RBCs as necessary.    Subjective:   Mucusy stool x1 last night but denies abd pain, diarrhea. Denies f/c/n/v/dyspnea   Objective:   BP 118/67 (BP Location: Left Arm)   Pulse 72   Temp 98.4 F (36.9 C) (Oral)   Resp 20   Ht 5' 10.5" (1.791 m)   Wt 87.3 kg   SpO2 95%   BMI 27.23 kg/m  No intake or output data in the 24 hours ending 01/26/18 1152 Weight change:   Physical Exam: General: AAOx3 NAD, pleasant CV: Heart RRR  Lungs: L/S CTA bilaterally Abd: abd SNT/ND with normal BS FO:YDXAJ to gravity Extremities: 3+ bilateral lower extremity edema  Imaging: US Biopsy (kidney)  Result Date: 01/25/2018 INDICATION: 77 year old with acute on chronic renal failure. Request for renal biopsy. EXAM: ULTRASOUND-GUIDED RIGHT RENAL BIOPSY MEDICATIONS: None. ANESTHESIA/SEDATION: Moderate (conscious) sedation was employed during this procedure. A total of Versed 1.0 mg and Fentanyl 50 mcg was administered intravenously. Moderate Sedation Time: 11 minutes. The patient's level of consciousness and vital signs were monitored continuously by radiology nursing throughout the procedure under my direct supervision. FLUOROSCOPY TIME:  None COMPLICATIONS: None immediate. PROCEDURE: Informed written consent was obtained from the patient after a thorough discussion of the procedural risks, benefits and alternatives. All questions were addressed. Both kidneys were evaluated with ultrasound. The right kidney was targeted for biopsy. Right flank was prepped with chlorhexidine and sterile field was created. Maximal barrier sterile technique was utilized including mask, sterile gowns, sterile gloves, sterile drape, hand hygiene and skin antiseptic. Skin was  anesthetized with 1% lidocaine. 73 gauge core biopsy needle was directed into the right kidney lower pole with  ultrasound guidance. Two core biopsies were obtained. Core biopsies appeared adequate and placed in saline. Bandage placed over the puncture site. FINDINGS: Negative for hydronephrosis. Core biopsies obtained from the right kidney lower pole. No significant bleeding or hematoma formation at the end of the procedure. IMPRESSION: Ultrasound-guided core biopsies of the right kidney lower pole. Electronically Signed   By: Markus Daft M.D.   On: 01/25/2018 17:13    Labs: BMET Recent Labs  Lab 01/21/18 0509 01/22/18 0328 01/23/18 0333 01/24/18 0357 01/24/18 0749 01/25/18 0346 01/26/18 0407  NA 138 136 136 136 136 137 137  K 3.6 3.8 3.8 3.8 3.7 3.6 3.5  CL 102 102 103 101 99 102 102  CO2 27 25 25 27 26 27 24   GLUCOSE 107* 110* 110* 112* 111* 106* 110*  BUN 41* 56* 67* 74* 77* 43* 55*  CREATININE 2.74* 3.36* 3.64* 3.84* 3.77* 3.13* 3.78*  CALCIUM 7.6* 7.7* 7.4* 7.8* 8.0* 7.8* 8.1*  PHOS 2.5 2.7 2.8 3.2 3.2 2.7 3.8   CBC Recent Labs  Lab 01/20/18 0718 01/20/18 1007 01/21/18 0509 01/23/18 0333 01/24/18 0750  WBC 15.8*  --  16.1* 14.8* 14.9*  HGB 8.7* 9.5* 8.3* 7.9* 8.0*  HCT 28.3* 28.0* 27.0* 25.6* 25.9*  MCV 77.1*  --  75.6* 75.1* 75.1*  PLT 375  --  357 363 415*    Medications:    . amiodarone  400 mg Oral BID  . carvedilol  3.125 mg Oral BID WC  . Chlorhexidine Gluconate Cloth  6 each Topical Q0600  . feeding supplement (NEPRO CARB STEADY)  237 mL Oral TID BM  . heparin  5,000 Units Subcutaneous Q8H  . mouth rinse  15 mL Mouth Rinse BID  . mometasone-formoterol  2 puff Inhalation BID  . multivitamin with minerals  1 tablet Oral Daily  . Netarsudil-Latanoprost  1 drop Both Eyes QHS  . pantoprazole  40 mg Oral BID  . sodium chloride flush  3 mL Intravenous Q12H      Otelia Santee, MD 01/26/2018, 11:52 AM

## 2018-01-27 LAB — RENAL FUNCTION PANEL
Albumin: 1.6 g/dL — ABNORMAL LOW (ref 3.5–5.0)
Anion gap: 11 (ref 5–15)
BUN: 37 mg/dL — ABNORMAL HIGH (ref 8–23)
CHLORIDE: 99 mmol/L (ref 98–111)
CO2: 26 mmol/L (ref 22–32)
Calcium: 7.5 mg/dL — ABNORMAL LOW (ref 8.9–10.3)
Creatinine, Ser: 3.15 mg/dL — ABNORMAL HIGH (ref 0.61–1.24)
GFR calc Af Amer: 21 mL/min — ABNORMAL LOW (ref >60)
GFR calc non Af Amer: 18 mL/min — ABNORMAL LOW (ref >60)
Glucose, Bld: 112 mg/dL — ABNORMAL HIGH (ref 70–99)
Phosphorus: 2.7 mg/dL (ref 2.5–4.6)
Potassium: 3.4 mmol/L — ABNORMAL LOW (ref 3.5–5.1)
Sodium: 136 mmol/L (ref 135–145)

## 2018-01-27 LAB — CBC
HEMATOCRIT: 23.7 % — AB (ref 39.0–52.0)
Hemoglobin: 7.5 g/dL — ABNORMAL LOW (ref 13.0–17.0)
MCH: 23.3 pg — ABNORMAL LOW (ref 26.0–34.0)
MCHC: 31.6 g/dL (ref 30.0–36.0)
MCV: 73.6 fL — ABNORMAL LOW (ref 80.0–100.0)
Platelets: 394 10*3/uL (ref 150–400)
RBC: 3.22 MIL/uL — ABNORMAL LOW (ref 4.22–5.81)
RDW: 22.4 % — ABNORMAL HIGH (ref 11.5–15.5)
WBC: 23.1 10*3/uL — ABNORMAL HIGH (ref 4.0–10.5)
nRBC: 0 % (ref 0.0–0.2)

## 2018-01-27 NOTE — Progress Notes (Signed)
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 Found to be markedly fluid overloaded with increasing bilateral pleural effusions and lower extremity edema felt to be related to acute on chronic diastolic congestive heart failure. Poor respto IV Lasix, and had worsening renal function prompting dialysis initiation. He did have a prior work-up that was concerning for hemochromatosis per MRI in November 2018. He also noted some urinary hesitancy and weak stream.  1. Chronic kidney disease stage III with a baseline serum creatinine around 1.7-1.8. His serum creatinine worsened in December to 2.6. He is a urine protein to creatinine ratio of 1.4 g. Serologies so far have been negative. He required dialysis initiation on 12/30 for worsening azotemia and continued peripheral volume overload not responsive to Lasix drip. His last dialysis treatment was on Saturday. A repeat urine protein to creatinine ratio is pending to evaluate for possible nephrotic syndrome. We will plan for dialysis on a Tuesday, Thursday, Saturday dialysis schedule.UPC 1.5 w/ neg SPEP and very low Albumin. - Serologies ordered- ANCA neg, C3C4(NL), RF(min elev), antiGBM neg - ANA neg in 12/2017  -  Appreciate Dr. Anselm Pancoast performing the biopsy 1/8 with 2 cores. No hematuria noted.  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 No fibrinoid necrosis or TIS but there is some glomerulosclerosis (not severe). We are waiting for the EM + PLA2R staining to determine if this is primary or secondary membranous. If primary then will tx with CTX or Rituximab -> I would favor Rituximab to limit the toxic CTX metabolite  bladder exposure in a pt who is currently on HD with minimal UOP. But tx decisions will not have to be made until results are back early next week and Dr. Jimmy Footman will be on service.  - Next HD tomorrow  2. Acute on chronic heart failure with preserved ejection fraction. He has underlying RV dysfunction. Diuretics are on hold due to lack of efficacy. Will plan ultrafiltration on dialysis.  3. SVT with ultrafiltration. Will monitor electrolytes closely.  4. Acute hypoxic respiratory failure. Improving with volume removal on dialysis.  5. Acute GI bleed. Status post GI eval and packed RBCs.  6. Anemia secondary to acute blood loss. Continue packed RBCs as necessary.  Subjective:   Denies abd pain, diarrhea. Denies f/c/n/v/dyspnea; no events overnight. He feels like he's able to get into bed a little more easily with the JOBS stockings on.   Objective:   BP 105/61 (BP Location: Left Arm)   Pulse 68   Temp 98.6 F (37 C) (Oral)   Resp 19   Ht 5' 10.5" (1.791 m)   Wt 87.4 kg   SpO2 95%   BMI 27.26 kg/m   Intake/Output Summary (Last 24 hours) at 01/27/2018 1057 Last data filed at 01/26/2018 2210 Gross per 24 hour  Intake 1100 ml  Output 2000 ml  Net -900 ml   Weight change:   Physical Exam: General: AAOx3 NAD, pleasant CV: Heart RRR  Lungs: L/S CTA bilaterally Abd: abd SNT/ND with normal BS Extremities: 3+ bilateral lower extremity edema  Imaging: US Biopsy (kidney)  Result Date: 01/25/2018 INDICATION: 78 year old with acute on chronic renal failure. Request for renal biopsy. EXAM: ULTRASOUND-GUIDED RIGHT RENAL BIOPSY MEDICATIONS: None. ANESTHESIA/SEDATION: Moderate (conscious)  sedation was employed during this procedure. A total of Versed 1.0 mg and Fentanyl 50 mcg was administered intravenously. Moderate Sedation Time: 11 minutes. The patient's level of consciousness and vital signs were monitored continuously by radiology nursing throughout the  procedure under my direct supervision. FLUOROSCOPY TIME:  None COMPLICATIONS: None immediate. PROCEDURE: Informed written consent was obtained from the patient after a thorough discussion of the procedural risks, benefits and alternatives. All questions were addressed. Both kidneys were evaluated with ultrasound. The right kidney was targeted for biopsy. Right flank was prepped with chlorhexidine and sterile field was created. Maximal barrier sterile technique was utilized including mask, sterile gowns, sterile gloves, sterile drape, hand hygiene and skin antiseptic. Skin was anesthetized with 1% lidocaine. 44 gauge core biopsy needle was directed into the right kidney lower pole with ultrasound guidance. Two core biopsies were obtained. Core biopsies appeared adequate and placed in saline. Bandage placed over the puncture site. FINDINGS: Negative for hydronephrosis. Core biopsies obtained from the right kidney lower pole. No significant bleeding or hematoma formation at the end of the procedure. IMPRESSION: Ultrasound-guided core biopsies of the right kidney lower pole. Electronically Signed   By: Markus Daft M.D.   On: 01/25/2018 17:13    Labs: BMET Recent Labs  Lab 01/22/18 0328 01/23/18 0333 01/24/18 0357 01/24/18 0749 01/25/18 0346 01/26/18 0407 01/27/18 0347  NA 136 136 136 136 137 137 136  K 3.8 3.8 3.8 3.7 3.6 3.5 3.4*  CL 102 103 101 99 102 102 99  CO2 25 25 27 26 27 24 26   GLUCOSE 110* 110* 112* 111* 106* 110* 112*  BUN 56* 67* 74* 77* 43* 55* 37*  CREATININE 3.36* 3.64* 3.84* 3.77* 3.13* 3.78* 3.15*  CALCIUM 7.7* 7.4* 7.8* 8.0* 7.8* 8.1* 7.5*  PHOS 2.7 2.8 3.2 3.2 2.7 3.8 2.7   CBC Recent Labs  Lab 01/21/18 0509 01/23/18 0333 01/24/18 0750 01/26/18 1846  WBC 16.1* 14.8* 14.9* 30.8*  HGB 8.3* 7.9* 8.0* 8.5*  HCT 27.0* 25.6* 25.9* 26.9*  MCV 75.6* 75.1* 75.1* 73.3*  PLT 357 363 415* 422*    Medications:    . amiodarone  400 mg Oral BID  . carvedilol  3.125 mg Oral BID  WC  . Chlorhexidine Gluconate Cloth  6 each Topical Q0600  . feeding supplement (NEPRO CARB STEADY)  237 mL Oral TID BM  . heparin  5,000 Units Subcutaneous Q8H  . mouth rinse  15 mL Mouth Rinse BID  . mometasone-formoterol  2 puff Inhalation BID  . multivitamin with minerals  1 tablet Oral Daily  . Netarsudil-Latanoprost  1 drop Both Eyes QHS  . pantoprazole  40 mg Oral BID  . sodium chloride flush  3 mL Intravenous Q12H      Otelia Santee, MD 01/27/2018, 10:57 AM

## 2018-01-27 NOTE — Progress Notes (Signed)
PROGRESS NOTE    [facility]   DDU:[id number]  DOB: 29-Dec-1940  DOA: 01/09/2018 PCP: [name], MD   Brief Narrative:  Andrew [facility] of HTN, HLD, bicuspid aortic valve with moderate AS, NSCLC s/p resection and radiation,HTN and PUD (duodenal and gastric ulcers) admitted from PCPs office on 01/09/2018 with hemoglobin of 6.4. He was hemoccult + in ED.  Also noted to be fluid overloaded and started on lasix. Continued to have renal failure and fluid overload not responding to Lasix and started on HD 12/30. Course complicated by PAF.    Subjective: He has no complaints today.   Assessment & Plan:   Principal Problem:  Hemoccult +/ PUD - noted to be hemoccult + with anemia on admission - EGD 01/10/18 with gastric and duodenal ulcers - on BID PPI - pathology shows> CHRONIC GASTRITIS AND INTESTINAL METAPLASIA  Active Problems: Anemia  - due to chronic bleeding - last Hb on 12/21/17 8.4 - Hb on 01/09/18 was 6.2 (also fluid overloaded and possibly dilutional) - s/p 2 U PRBC which brought Hb up to ~8- 9  AKI on  CKD 3  - fluid overload with acute on chronic diastolic CHF and moderate RV dysfunction - See ECHO below- has normal EF and grade 1 d CHF - cr on 11/1 was 1.79 and has subsequently worsened to 3-4 range - needed to start dialysis on 12/30 as his volume overload did not respond to a Lasix infusion - s/p right renal biopsy 01/25/18- awaiting results  Episodes of SVT, h/o PAF CHA2DS2-VASc Score 3 - has had a cardiology eval - recommended plan for Amiodarone>   400 mg twice a day x 1 wk (starting 01/22/2018),  then 200 twice daily for 2 weeks (start 1/11)and then 200 once a day thereafter - cont Coreg 3.125 mg bid  -  no anticoagulation for now due above mentioned to PUD  Hemachromatosis? - noted on MRI on 11/22 to have iron depositions in spleen and liver - Scheduled to see hematology outpatient but not until 03/2018-    Moderate Aortic stenosis due to bicuspid  aortic valve - stable per cardiology  NSCLC -  post prior surgery and radiation     Malnutrition of moderate degree    - cont supplements    Time spent in minutes: 35  DVT prophylaxis: SCDS Code Status: Full code Family Communication: wife at bedside  Disposition Plan:    Consultants:   Cardiology  GI  Nephrology  IR Procedures:   EGD 12/24 LA Class A reflux esophagitis. - Non-bleeding gastric ulcers with no stigmata of bleeding. Biopsied. - Recent diagnosis of intestinal metaplasia and H pylori. Biopsies obtained for mapping and to document response to therapy, respectively. - Multiple non-bleeding duodenal ulcers with no stigmata of bleeding.  TTE 01/13/18 Impressions: EF 60 to 65% without regional wall motion normalities, - Compared to the exam from 10/24/17, the degree of right ventricular dilation has increased, and right ventricular systolic function has decreased. The degree of leftward septal shift and D shaped septum has worsened. There is now a trivial-small pericardial effusion. Side by side comparison of images performed  Antimicrobials:  Anti-infectives (From admission, onward)   Start     Dose/Rate Route Frequency Ordered Stop   01/16/18 1230  ceFAZolin (ANCEF) IVPB 2g/100 mL premix    Note to Pharmacy:  Pre IR procedure, procedure time 12/30 not yet known   2 g 200 mL/hr over 30 Minutes Intravenous To Surgery 01/15/18 1112  01/17/18 1230       Objective: Vitals:   01/27/18 0530 01/27/18 0705 01/27/18 0804 01/27/18 1245  BP: (!) 88/43 105/61  (!) 91/49  Pulse: 71 68  64  Resp: 19     Temp: 98.6 F (37 C)   99.2 F (37.3 C)  TempSrc: Oral     SpO2: 98%  95%   Weight:      Height:        Intake/Output Summary (Last 24 hours) at 01/27/2018 1324 Last data filed at 01/26/2018 2210 Gross per 24 hour  Intake 1100 ml  Output 2000 ml  Net -900 ml   Filed Weights   01/26/18 1836 01/26/18 1900 01/26/18 2217  Weight: 90.1 kg 89.4  kg 87.4 kg    Examination: General exam: Appears comfortable  HEENT: PERRLA, oral mucosa moist, no sclera icterus or thrush Respiratory system: Clear to auscultation. Respiratory effort normal. Cardiovascular system: S1 & S2 heard,  No murmurs  Gastrointestinal system: Abdomen soft, non-tender, nondistended. Normal bowel sound. No organomegaly Central nervous system: Alert and oriented. No focal neurological deficits. Extremities: No cyanosis, clubbing - severe pedal dema Skin: No rashes or ulcers Psychiatry:  Mood & affect appropriate.     Data Reviewed: I have personally reviewed following labs and imaging studies  CBC: Recent Labs  Lab 01/21/18 0509 01/23/18 0333 01/24/18 0750 01/26/18 1846  WBC 16.1* 14.8* 14.9* 30.8*  HGB 8.3* 7.9* 8.0* 8.5*  HCT 27.0* 25.6* 25.9* 26.9*  MCV 75.6* 75.1* 75.1* 73.3*  PLT 357 363 415* 502*   Basic Metabolic Panel: Recent Labs  Lab 01/24/18 0357 01/24/18 0749 01/25/18 0346 01/26/18 0407 01/27/18 0347  NA 136 136 137 137 136  K 3.8 3.7 3.6 3.5 3.4*  CL 101 99 102 102 99  CO2 27 26 27 24 26   GLUCOSE 112* 111* 106* 110* 112*  BUN 74* 77* 43* 55* 37*  CREATININE 3.84* 3.77* 3.13* 3.78* 3.15*  CALCIUM 7.8* 8.0* 7.8* 8.1* 7.5*  PHOS 3.2 3.2 2.7 3.8 2.7   GFR: Estimated Creatinine Clearance: 20 mL/min (A) (by C-G formula based on SCr of 3.15 mg/dL (H)). Liver Function Tests: Recent Labs  Lab 01/21/18 0509  01/24/18 0357 01/24/18 0749 01/25/18 0346 01/26/18 0407 01/27/18 0347  AST 24  --   --   --   --   --   --   ALT 30  --   --   --   --   --   --   ALKPHOS 74  --   --   --   --   --   --   BILITOT <0.1*  --   --   --   --   --   --   PROT 4.6*  --   --   --   --   --   --   ALBUMIN 1.6*   < > 1.5* 1.6* 1.6* 1.7* 1.6*   < > = values in this interval not displayed.   No results for input(s): LIPASE, AMYLASE in the last 168 hours. No results for input(s): AMMONIA in the last 168 hours. Coagulation Profile: Recent Labs    Lab 01/25/18 0346  INR 1.23   Cardiac Enzymes: No results for input(s): CKTOTAL, CKMB, CKMBINDEX, TROPONINI in the last 168 hours. BNP (last 3 results) No results for input(s): PROBNP in the last 8760 hours. HbA1C: No results for input(s): HGBA1C in the last 72 hours. CBG: No results for input(s): GLUCAP in  the last 168 hours. Lipid Profile: No results for input(s): CHOL, HDL, LDLCALC, TRIG, CHOLHDL, LDLDIRECT in the last 72 hours. Thyroid Function Tests: No results for input(s): TSH, T4TOTAL, FREET4, T3FREE, THYROIDAB in the last 72 hours. Anemia Panel: No results for input(s): VITAMINB12, FOLATE, FERRITIN, TIBC, IRON, RETICCTPCT in the last 72 hours. Urine analysis:    Component Value Date/Time   COLORURINE YELLOW 01/13/2018 1058   APPEARANCEUR HAZY (A) 01/13/2018 1058   LABSPEC 1.014 01/13/2018 1058   PHURINE 5.0 01/13/2018 1058   GLUCOSEU NEGATIVE 01/13/2018 1058   HGBUR NEGATIVE 01/13/2018 1058   BILIRUBINUR NEGATIVE 01/13/2018 1058   KETONESUR NEGATIVE 01/13/2018 1058   PROTEINUR 100 (A) 01/13/2018 1058   NITRITE NEGATIVE 01/13/2018 1058   LEUKOCYTESUR NEGATIVE 01/13/2018 1058   Sepsis Labs: @LABRCNTIP (procalcitonin:4,lacticidven:4) )No results found for this or any previous visit (from the past 240 hour(s)).       Radiology Studies: No results found.    Scheduled Meds: . amiodarone  400 mg Oral BID  . carvedilol  3.125 mg Oral BID WC  . Chlorhexidine Gluconate Cloth  6 each Topical Q0600  . feeding supplement (NEPRO CARB STEADY)  237 mL Oral TID BM  . heparin  5,000 Units Subcutaneous Q8H  . mouth rinse  15 mL Mouth Rinse BID  . mometasone-formoterol  2 puff Inhalation BID  . multivitamin with minerals  1 tablet Oral Daily  . Netarsudil-Latanoprost  1 drop Both Eyes QHS  . pantoprazole  40 mg Oral BID  . sodium chloride flush  3 mL Intravenous Q12H   Continuous Infusions: . sodium chloride       LOS: 18 days      Debbe Odea, MD [facility] Pager: [email or web address] Password [email or web address] 01/27/2018, 1:24 PM

## 2018-01-28 LAB — CBC
HCT: 24.5 % — ABNORMAL LOW (ref 39.0–52.0)
Hemoglobin: 7.8 g/dL — ABNORMAL LOW (ref 13.0–17.0)
MCH: 23.5 pg — ABNORMAL LOW (ref 26.0–34.0)
MCHC: 31.8 g/dL (ref 30.0–36.0)
MCV: 73.8 fL — ABNORMAL LOW (ref 80.0–100.0)
Platelets: 410 10*3/uL — ABNORMAL HIGH (ref 150–400)
RBC: 3.32 MIL/uL — ABNORMAL LOW (ref 4.22–5.81)
RDW: 22.5 % — ABNORMAL HIGH (ref 11.5–15.5)
WBC: 15.6 10*3/uL — ABNORMAL HIGH (ref 4.0–10.5)
nRBC: 0 % (ref 0.0–0.2)

## 2018-01-28 LAB — RENAL FUNCTION PANEL
Albumin: 1.8 g/dL — ABNORMAL LOW (ref 3.5–5.0)
Anion gap: 12 (ref 5–15)
BUN: 52 mg/dL — ABNORMAL HIGH (ref 8–23)
CO2: 25 mmol/L (ref 22–32)
Calcium: 8.1 mg/dL — ABNORMAL LOW (ref 8.9–10.3)
Chloride: 99 mmol/L (ref 98–111)
Creatinine, Ser: 4.26 mg/dL — ABNORMAL HIGH (ref 0.61–1.24)
GFR calc Af Amer: 14 mL/min — ABNORMAL LOW (ref >60)
GFR calc non Af Amer: 12 mL/min — ABNORMAL LOW (ref >60)
Glucose, Bld: 125 mg/dL — ABNORMAL HIGH (ref 70–99)
POTASSIUM: 3.5 mmol/L (ref 3.5–5.1)
Phosphorus: 3.6 mg/dL (ref 2.5–4.6)
Sodium: 136 mmol/L (ref 135–145)

## 2018-01-28 MED ORDER — LIDOCAINE HCL (PF) 1 % IJ SOLN: 5.0000 mL | INTRAMUSCULAR | Status: DC | PRN

## 2018-01-28 MED ORDER — HEPARIN SODIUM (PORCINE) 1000 UNIT/ML IJ SOLN
INTRAMUSCULAR | Status: AC
  Administered 2018-01-28: 3200 [IU] via INTRAVENOUS_CENTRAL
  Filled 2018-01-28: qty 3

## 2018-01-28 MED ORDER — HEPARIN SODIUM (PORCINE) 1000 UNIT/ML DIALYSIS
1000.0000 [IU] | INTRAMUSCULAR | Status: DC | PRN
  Administered 2018-01-31: 2600 [IU] via INTRAVENOUS_CENTRAL
  Filled 2018-01-28: qty 1

## 2018-01-28 MED ORDER — LIDOCAINE-PRILOCAINE 2.5-2.5 % EX CREA: 1.0000 "application " | TOPICAL_CREAM | CUTANEOUS | Status: DC | PRN

## 2018-01-28 MED ORDER — PENTAFLUOROPROP-TETRAFLUOROETH EX AERO: 1.0000 "application " | INHALATION_SPRAY | CUTANEOUS | Status: DC | PRN

## 2018-01-28 NOTE — Progress Notes (Signed)
PROGRESS NOTE    Andrew Guerra   FBP:[id number]  DOB: Jun 22, 1940  DOA: 01/09/2018 PCP: [name], MD   Brief Narrative:  [name] of HTN, HLD, bicuspid aortic valve with moderate AS, NSCLC s/p resection and radiation,HTN and PUD (duodenal and gastric ulcers) admitted from PCPs office on 01/09/2018 with hemoglobin of 6.4. He was hemoccult + in ED.  Also noted to be fluid overloaded and started on lasix. Continued to have renal failure and fluid overload not responding to Lasix and started on HD 12/30. Course complicated by PAF.    Subjective: No complaints today. Has been walking in the hall.   Assessment & Plan:   Principal Problem:  Hemoccult +/ PUD - noted to be hemoccult + with anemia on admission - EGD 01/10/18 with gastric and duodenal ulcers - on BID PPI - pathology shows> CHRONIC GASTRITIS AND INTESTINAL METAPLASIA  Active Problems: Anemia  - due to chronic bleeding - last Hb on 12/21/17 8.4 - Hb on 01/09/18 was 6.2 (also fluid overloaded and possibly dilutional) - s/p 2 U PRBC which brought Hb up to ~8- 9  AKI on  CKD 3  - fluid overload with acute on chronic diastolic CHF and moderate RV dysfunction - See ECHO below- has normal EF and grade 1 d CHF - cr on 11/1 was 1.79 and has subsequently worsened to 3-4 range - needed to start dialysis on 12/30 as his volume overload did not respond to a Lasix infusion - s/p right renal biopsy 01/25/18-nephrology managing  Episodes of SVT, h/o PAF CHA2DS2-VASc Score 3 - has had a cardiology eval - recommended plan for Amiodarone>   400 mg twice a day x 1 wk (starting 01/22/2018),  then 200 twice daily for 2 weeks (start 1/11)and then 200 once a day thereafter - cont Coreg 3.125 mg bid  -  no anticoagulation for now due above mentioned to PUD  Hemachromatosis? - noted on MRI on 11/22 to have iron depositions in spleen and liver - Scheduled to see hematology outpatient but not until 03/2018-    Moderate Aortic  stenosis due to bicuspid aortic valve - stable per cardiology  NSCLC -  post prior surgery and radiation     Malnutrition of moderate degree    - cont supplements    Time spent in minutes: 35  DVT prophylaxis: SCDS Code Status: Full code Family Communication: wife at bedside  Disposition Plan:    Consultants:   Cardiology  GI  Nephrology  IR Procedures:   EGD 12/24 LA Class A reflux esophagitis. - Non-bleeding gastric ulcers with no stigmata of bleeding. Biopsied. - Recent diagnosis of intestinal metaplasia and H pylori. Biopsies obtained for mapping and to document response to therapy, respectively. - Multiple non-bleeding duodenal ulcers with no stigmata of bleeding.  TTE 01/13/18 Impressions: EF 60 to 65% without regional wall motion normalities, - Compared to the exam from 10/24/17, the degree of right ventricular dilation has increased, and right ventricular systolic function has decreased. The degree of leftward septal shift and D shaped septum has worsened. There is now a trivial-small pericardial effusion. Side by side comparison of images performed  Antimicrobials:  Anti-infectives (From admission, onward)   Start     Dose/Rate Route Frequency Ordered Stop   01/16/18 1230  ceFAZolin (ANCEF) IVPB 2g/100 mL premix    Note to Pharmacy:  Pre IR procedure, procedure time 12/30 not yet known   2 g 200 mL/hr over 30 Minutes Intravenous To  Surgery 01/15/18 1112 01/17/18 1230       Objective: Vitals:   01/28/18 1530 01/28/18 1600 01/28/18 1630 01/28/18 1653  BP: (!) 120/59 125/60 132/63 (!) 144/67  Pulse: 69 70 71 74  Resp: (!) 25 (!) 24 (!) 23 (!) 24  Temp:    98.1 F (36.7 C)  TempSrc:    Oral  SpO2: 97% 97% 96% 96%  Weight:    85.4 kg  Height:        Intake/Output Summary (Last 24 hours) at 01/28/2018 1726 Last data filed at 01/28/2018 1653 Gross per 24 hour  Intake 603 ml  Output 3025 ml  Net -2422 ml   Filed Weights   01/28/18  0500 01/28/18 1248 01/28/18 1653  Weight: 84.7 kg 88.2 kg 85.4 kg    Examination: General exam: Appears comfortable  HEENT: PERRLA, oral mucosa moist, no sclera icterus or thrush Respiratory system: Clear to auscultation. Respiratory effort normal. Cardiovascular system: S1 & S2 heard,  No murmurs  Gastrointestinal system: Abdomen soft, non-tender, nondistended. Normal bowel sound. No organomegaly Central nervous system: Alert and oriented. No focal neurological deficits. Extremities: No cyanosis, clubbing - continues to have significant pedal edema Skin: No rashes or ulcers Psychiatry:  Mood & affect appropriate.     Data Reviewed: I have personally reviewed following labs and imaging studies  CBC: Recent Labs  Lab 01/23/18 0333 01/24/18 0750 01/26/18 1846 01/27/18 1311 01/28/18 1314  WBC 14.8* 14.9* 30.8* 23.1* 15.6*  HGB 7.9* 8.0* 8.5* 7.5* 7.8*  HCT 25.6* 25.9* 26.9* 23.7* 24.5*  MCV 75.1* 75.1* 73.3* 73.6* 73.8*  PLT 363 415* 422* 394 601*   Basic Metabolic Panel: Recent Labs  Lab 01/24/18 0749 01/25/18 0346 01/26/18 0407 01/27/18 0347 01/28/18 0345  NA 136 137 137 136 136  K 3.7 3.6 3.5 3.4* 3.5  CL 99 102 102 99 99  CO2 26 27 24 26 25   GLUCOSE 111* 106* 110* 112* 125*  BUN 77* 43* 55* 37* 52*  CREATININE 3.77* 3.13* 3.78* 3.15* 4.26*  CALCIUM 8.0* 7.8* 8.1* 7.5* 8.1*  PHOS 3.2 2.7 3.8 2.7 3.6   GFR: Estimated Creatinine Clearance: 14.8 mL/min (A) (by C-G formula based on SCr of 4.26 mg/dL (H)). Liver Function Tests: Recent Labs  Lab 01/24/18 0749 01/25/18 0346 01/26/18 0407 01/27/18 0347 01/28/18 0345  ALBUMIN 1.6* 1.6* 1.7* 1.6* 1.8*   No results for input(s): LIPASE, AMYLASE in the last 168 hours. No results for input(s): AMMONIA in the last 168 hours. Coagulation Profile: Recent Labs  Lab 01/25/18 0346  INR 1.23   Cardiac Enzymes: No results for input(s): CKTOTAL, CKMB, CKMBINDEX, TROPONINI in the last 168 hours. BNP (last 3  results) No results for input(s): PROBNP in the last 8760 hours. HbA1C: No results for input(s): HGBA1C in the last 72 hours. CBG: No results for input(s): GLUCAP in the last 168 hours. Lipid Profile: No results for input(s): CHOL, HDL, LDLCALC, TRIG, CHOLHDL, LDLDIRECT in the last 72 hours. Thyroid Function Tests: No results for input(s): TSH, T4TOTAL, FREET4, T3FREE, THYROIDAB in the last 72 hours. Anemia Panel: No results for input(s): VITAMINB12, FOLATE, FERRITIN, TIBC, IRON, RETICCTPCT in the last 72 hours. Urine analysis:    Component Value Date/Time   COLORURINE YELLOW 01/13/2018 1058   APPEARANCEUR HAZY (A) 01/13/2018 1058   LABSPEC 1.014 01/13/2018 1058   PHURINE 5.0 01/13/2018 1058   GLUCOSEU NEGATIVE 01/13/2018 1058   HGBUR NEGATIVE 01/13/2018 [place] 01/13/2018 1058   KETONESUR NEGATIVE  01/13/2018 1058   PROTEINUR 100 (A) 01/13/2018 1058   NITRITE NEGATIVE 01/13/2018 1058   LEUKOCYTESUR NEGATIVE 01/13/2018 1058   Sepsis Labs: @LABRCNTIP (procalcitonin:4,lacticidven:4) )No results found for this or any previous visit (from the past 240 hour(s)).       Radiology Studies: No results found.    Scheduled Meds: . heparin      . amiodarone  400 mg Oral BID  . carvedilol  3.125 mg Oral BID WC  . Chlorhexidine Gluconate Cloth  6 each Topical Q0600  . feeding supplement (NEPRO CARB STEADY)  237 mL Oral TID BM  . heparin  5,000 Units Subcutaneous Q8H  . mouth rinse  15 mL Mouth Rinse BID  . mometasone-formoterol  2 puff Inhalation BID  . multivitamin with minerals  1 tablet Oral Daily  . Netarsudil-Latanoprost  1 drop Both Eyes QHS  . pantoprazole  40 mg Oral BID  . sodium chloride flush  3 mL Intravenous Q12H   Continuous Infusions: . sodium chloride       LOS: 19 days      Debbe Odea, MD [facility] Pager: [email or web address] Password [facility] 01/28/2018, 5:26 PM

## 2018-01-28 NOTE — Progress Notes (Signed)
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 Found to be markedly fluid overloaded with increasing bilateral pleural effusions and lower extremity edema felt to be related to acute on chronic diastolic congestive heart failure. Poor respto IV Lasix, and had worsening renal function prompting dialysis initiation. He did have a prior work-up that was concerning for hemochromatosis per MRI in November 2018. He also noted some urinary hesitancy and weak stream.  1. Chronic kidney disease stage III with a baseline serum creatinine around 1.7-1.8. His serum creatinine worsened in December to 2.6. He is a urine protein to creatinine ratio of 1.4 g. Serologies so far have been negative. He required dialysis initiation on 12/30 for worsening azotemia and continued peripheral volume overload not responsive to Lasix drip. His last dialysis treatment was on Saturday. A repeat urine protein to creatinine ratio is pending to evaluate for possible nephrotic syndrome. We will plan for dialysis on a Tuesday, Thursday, Saturday dialysis schedule.UPC 1.5 w/ neg SPEP and very low Albumin. - Serologies ordered- ANCAneg, C3C4(NL), RF(min elev), antiGBMneg - ANA neg in 12/2017  -Appreciate Dr. Anselm Pancoast performing thebiopsy1/8 with 2 cores. No hematuria noted.  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 No fibrinoid necrosis or TIS but there is some glomerulosclerosis (not severe). We are waiting for the EM + PLA2R staining to determine if this is primary or secondary membranous. If primary then will tx with CTX or Rituximab -> I would favor Rituximab to limit the toxic CTX metabolite  bladder exposure in a pt who is currently on HD with minimal UOP. But tx decisions will not have to be made until results are back early next week and Dr. Jimmy Footman will be on service.  -Next HD today  - We've been checking routine bladder scan and last one 174ml on 1/9.  - This may be a prolonged course -> will start CLIP process and convert to tunneled Mon.  2. Acute on chronic heart failure with preserved ejection fraction. He has underlying RV dysfunction. Diuretics are on hold due to lack of efficacy. Will plan ultrafiltration on dialysis.  3. SVT with ultrafiltration. Will monitor electrolytes closely.  4. Acute hypoxic respiratory failure. Improving with volume removal on dialysis.  5. Acute GI bleed. Status post GI eval and packed RBCs.  6. Anemia secondary to acute blood loss. Continue packed RBCs as necessary.  Subjective:   Denies abd pain, diarrhea. Denies f/c/n/v/dyspnea; no events overnight. He feels like he's able to get into bed a little more easily with the JOBS stockings on.  Still not making much urine; he feels a little urgency to void   Objective:   BP 127/60 (BP Location: Left Arm)   Pulse 73   Temp 99.6 F (37.6 C) (Oral)   Resp 18   Ht 5' 10.5" (1.791 m)   Wt 84.7 kg   SpO2 94%   BMI 26.41 kg/m   Intake/Output Summary (Last 24 hours) at 01/28/2018 1024 Last data filed at 01/28/2018 0912 Gross per 24 hour  Intake 600 ml  Output 25 ml  Net 575 ml   Weight change: -5.4 kg  Physical Exam: General: AAOx3 NAD, pleasant CV: Heart RRR  Lungs: L/S CTA bilaterally Abd: abd SNT/ND with  normal BS Extremities: 3+ bilateral lower extremity edema Access: RIJ temp  Imaging: No results found.  Labs: BMET Recent Labs  Lab 01/23/18 0333 01/24/18 0357 01/24/18 0749 01/25/18 0346 01/26/18 0407 01/27/18 0347 01/28/18 0345  NA 136 136 136 137 137 136 136  K 3.8 3.8 3.7 3.6 3.5 3.4* 3.5  CL 103 101 99 102 102 99 99  CO2 25 27  26 27 24 26 25   GLUCOSE 110* 112* 111* 106* 110* 112* 125*  BUN 67* 74* 77* 43* 55* 37* 52*  CREATININE 3.64* 3.84* 3.77* 3.13* 3.78* 3.15* 4.26*  CALCIUM 7.4* 7.8* 8.0* 7.8* 8.1* 7.5* 8.1*  PHOS 2.8 3.2 3.2 2.7 3.8 2.7 3.6   CBC Recent Labs  Lab 01/23/18 0333 01/24/18 0750 01/26/18 1846 01/27/18 1311  WBC 14.8* 14.9* 30.8* 23.1*  HGB 7.9* 8.0* 8.5* 7.5*  HCT 25.6* 25.9* 26.9* 23.7*  MCV 75.1* 75.1* 73.3* 73.6*  PLT 363 415* 422* 394    Medications:    . amiodarone  400 mg Oral BID  . carvedilol  3.125 mg Oral BID WC  . Chlorhexidine Gluconate Cloth  6 each Topical Q0600  . feeding supplement (NEPRO CARB STEADY)  237 mL Oral TID BM  . heparin  5,000 Units Subcutaneous Q8H  . mouth rinse  15 mL Mouth Rinse BID  . mometasone-formoterol  2 puff Inhalation BID  . multivitamin with minerals  1 tablet Oral Daily  . Netarsudil-Latanoprost  1 drop Both Eyes QHS  . pantoprazole  40 mg Oral BID  . sodium chloride flush  3 mL Intravenous Q12H      Otelia Santee, MD 01/28/2018, 10:24 AM

## 2018-01-29 ENCOUNTER — Inpatient Hospital Stay (HOSPITAL_COMMUNITY): Payer: Medicare Other

## 2018-01-29 DIAGNOSIS — R410 Disorientation, unspecified: Secondary | ICD-10-CM

## 2018-01-29 LAB — OCCULT BLOOD X 1 CARD TO LAB, STOOL: Fecal Occult Bld: NEGATIVE

## 2018-01-29 LAB — HEMOGLOBIN AND HEMATOCRIT, BLOOD
HCT: 23.6 % — ABNORMAL LOW (ref 39.0–52.0)
HCT: 27.7 % — ABNORMAL LOW (ref 39.0–52.0)
HEMOGLOBIN: 7.3 g/dL — AB (ref 13.0–17.0)
Hemoglobin: 8.5 g/dL — ABNORMAL LOW (ref 13.0–17.0)

## 2018-01-29 LAB — URINALYSIS, ROUTINE W REFLEX MICROSCOPIC
Glucose, UA: NEGATIVE mg/dL
KETONES UR: 5 mg/dL — AB
Nitrite: NEGATIVE
Protein, ur: 100 mg/dL — AB
RBC / HPF: >50 RBC/hpf — ABNORMAL HIGH (ref 0–5)
Specific Gravity, Urine: 1.024 (ref 1.005–1.030)
WBC, UA: >50 WBC/hpf — ABNORMAL HIGH (ref 0–5)
pH: 5 (ref 5.0–8.0)

## 2018-01-29 LAB — RENAL FUNCTION PANEL
Albumin: 1.8 g/dL — ABNORMAL LOW (ref 3.5–5.0)
Anion gap: 12 (ref 5–15)
BUN: 29 mg/dL — ABNORMAL HIGH (ref 8–23)
CO2: 26 mmol/L (ref 22–32)
Calcium: 8 mg/dL — ABNORMAL LOW (ref 8.9–10.3)
Chloride: 98 mmol/L (ref 98–111)
Creatinine, Ser: 2.96 mg/dL — ABNORMAL HIGH (ref 0.61–1.24)
GFR calc Af Amer: 22 mL/min — ABNORMAL LOW (ref >60)
GFR, EST NON AFRICAN AMERICAN: 19 mL/min — AB (ref >60)
Glucose, Bld: 117 mg/dL — ABNORMAL HIGH (ref 70–99)
Phosphorus: 3 mg/dL (ref 2.5–4.6)
Potassium: 3.7 mmol/L (ref 3.5–5.1)
Sodium: 136 mmol/L (ref 135–145)

## 2018-01-29 LAB — PREPARE RBC (CROSSMATCH)

## 2018-01-29 MED ORDER — SODIUM CHLORIDE 0.9% IV SOLUTION: Freq: Once | INTRAVENOUS | Status: DC

## 2018-01-29 MED ORDER — SODIUM CHLORIDE 0.9% FLUSH
10.0000 mL | INTRAVENOUS | Status: DC | PRN
  Administered 2018-01-30: 10 mL
  Filled 2018-01-29: qty 40

## 2018-01-29 MED ORDER — AMIODARONE HCL 200 MG PO TABS
200.0000 mg | ORAL_TABLET | Freq: Two times a day (BID) | ORAL | Status: DC
  Administered 2018-01-29 – 2018-02-09 (×22): 200 mg via ORAL
  Filled 2018-01-29 (×22): qty 1

## 2018-01-29 MED ORDER — AZITHROMYCIN 500 MG PO TABS
250.0000 mg | ORAL_TABLET | Freq: Every day | ORAL | Status: AC
  Administered 2018-01-30 – 2018-02-02 (×4): 250 mg via ORAL
  Filled 2018-01-29 (×4): qty 1

## 2018-01-29 MED ORDER — SODIUM CHLORIDE 0.9 % IV SOLN
1.0000 g | INTRAVENOUS | Status: DC
  Administered 2018-01-29 – 2018-01-31 (×3): 1 g via INTRAVENOUS
  Filled 2018-01-29 (×3): qty 10

## 2018-01-29 MED ORDER — AZITHROMYCIN 500 MG PO TABS
500.0000 mg | ORAL_TABLET | Freq: Every day | ORAL | Status: AC
  Administered 2018-01-29: 500 mg via ORAL
  Filled 2018-01-29: qty 1

## 2018-01-29 NOTE — Progress Notes (Signed)
PROGRESS NOTE    Andrew Guerra   NTZ:[id number]  DOB: 12-30-1940  DOA: 01/09/2018 PCP: [name], MD   Brief Narrative:  [name] of HTN, HLD, bicuspid aortic valve with moderate AS, NSCLC s/p resection and radiation,HTN and PUD (duodenal and gastric ulcers) admitted from PCPs office on 01/09/2018 with hemoglobin of 6.4. He was hemoccult + in ED.  Also noted to be fluid overloaded and started on lasix. Continued to have renal failure and fluid overload not responding to Lasix and started on HD 12/30. Course complicated by PAF.    Subjective: Evaluated patient this AM. He was confused to time. He did not remember falling last night or receiving dialysis yesterday. Per orders, he was given Ambien in the middle of the night. We do not know if his confusion started prior to or after the Ambien but his fall occurred in the morning.  Assessment & Plan:   Principal Problem: Delirium and fall - We do not know if his confusion started prior to or after the Ambien- fall occurred after the Ambien - hold Ambien- avoid all sedatives -  MRI brain negative - CXR shows infiltrates (see below) - f/u UA     Active Problems:  ? Pneumonia - bibasilar infiltrates right > left- per RN he has a cough- pulse ox is about 86% on room air-he did have these infiltrates on admission but no cough and thus suspected to be atelectasis- at this time, will treat for pneumonia due to cough/ hypoxia/ leukocytosis -  I will start Ceftriaxone and Azithromycin   Hemoccult +/ PUD - noted to be hemoccult + with anemia on admission - EGD 01/10/18 with gastric and duodenal ulcers- treated for H pylori - pathology showed> CHRONIC GASTRITIS AND INTESTINAL METAPLASIA - on BID PPI  Anemia  - due to chronic bleeding - last Hb on 12/21/17 8.4 - Hb on 01/09/18 was 6.2 (also fluid overloaded and possibly dilutional) - s/p 2 U PRBC which brought Hb up to ~8- 9 - Hb now down to 7.3- will transfuse 1 U PRBC  today  AKI on  CKD 3  - fluid overload   - Cr on 11/1 was 1.79 and has subsequently worsened to 3-4 range - needed to start dialysis on 12/30 as his volume overload did not respond to a Lasix infusion - s/p right renal biopsy 01/25/18 - management per nephrology- they are pursuing CLIP process, tunneled cath  Episodes of SVT, h/o PAF CHA2DS2-VASc Score 3 - has had a cardiology eval - recommended plan for Amiodarone>   400 mg twice a day x 1 wk (starting 01/22/2018),  then 200 twice daily for 2 weeks and then 200 once a day thereafter (will start ~ 1/25) - cont Coreg 3.125 mg bid  -  no anticoagulation for now due above mentioned to PUD and bleeding- will need to reconsider anticoagulation once ulcers healed - will follow hemoccults   Hemachromatosis? - noted on MRI on 11/22 to have iron depositions in spleen and liver - Scheduled to see hematology outpatient but not until 03/2018-    Moderate 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 - stable per cardiology  NSCLC- left upper lung -  post prior surgery and radiation     Malnutrition of moderate degree    - cont supplements   Moderate pulm HTN, moderate RV failure, mild to mod Ao stenosis with bicuspid Ao valve, grade 1 d CHF - H/o Rheumatic fever  Time spent in minutes: 35  DVT prophylaxis: SCDS Code Status: Full code Family Communication: wife at bedside  Disposition Plan:    Consultants:   Cardiology  GI  Nephrology  IR Procedures:   EGD 12/24 LA Class A reflux esophagitis. - Non-bleeding gastric ulcers with no stigmata of bleeding. Biopsied. - Recent diagnosis of intestinal metaplasia and H pylori. Biopsies obtained for mapping and to document response to therapy, respectively. - Multiple non-bleeding duodenal ulcers with no stigmata of bleeding.  TTE 01/13/18 Impressions: EF 60 to 65% without regional wall motion normalities, - Compared to the exam from 10/24/17, the degree of right ventricular  dilation has increased, and right ventricular systolic function has decreased. The degree of leftward septal shift and D shaped septum has worsened. There is now a trivial-small pericardial effusion. Side by side comparison of images performed  Antimicrobials:  Anti-infectives (From admission, onward)   Start     Dose/Rate Route Frequency Ordered Stop   01/16/18 1230  ceFAZolin (ANCEF) IVPB 2g/100 mL premix    Note to Pharmacy:  Pre IR procedure, procedure time 12/30 not yet known   2 g 200 mL/hr over 30 Minutes Intravenous To Surgery 01/15/18 1112 01/17/18 1230       Objective: Vitals:   01/28/18 2140 01/29/18 0501 01/29/18 0800 01/29/18 1148  BP: (!) 109/54 (!) 94/55  117/66  Pulse: 78 77  68  Resp: 20 18 (!) 26 (!) 24  Temp: 98.1 F (36.7 C) 98.3 F (36.8 C)  97.7 F (36.5 C)  TempSrc: Oral Oral  Oral  SpO2: 94% 95%  93%  Weight:      Height:        Intake/Output Summary (Last 24 hours) at 01/29/2018 1254 Last data filed at 01/29/2018 1216 Gross per 24 hour  Intake 3 ml  Output 3000 ml  Net -2997 ml   Filed Weights   01/28/18 0500 01/28/18 1248 01/28/18 1653  Weight: 84.7 kg 88.2 kg 85.4 kg    Examination: General exam: Appears comfortable  HEENT: PERRLA, oral mucosa moist, no sclera icterus or thrush Respiratory system: Clear to auscultation. Respiratory effort normal. Pulse ox > 86-88% on room air Cardiovascular system: S1 & S2 heard,  No murmurs  Gastrointestinal system: Abdomen soft, non-tender, nondistended. Normal bowel sound. No organomegaly Central nervous system: Alert and oriented x 2- not to time, memory deficits regarding events from yesterday and last night noted. No focal weakness.  Extremities: No cyanosis, clubbing or edema Skin: No rashes or ulcers Psychiatry:  Mood & affect appropriate.     Data Reviewed: I have personally reviewed following labs and imaging studies  CBC: Recent Labs  Lab 01/23/18 0333 01/24/18 0750  01/26/18 1846 01/27/18 1311 01/28/18 1314 01/29/18 0739  WBC 14.8* 14.9* 30.8* 23.1* 15.6*  --   HGB 7.9* 8.0* 8.5* 7.5* 7.8* 7.3*  HCT 25.6* 25.9* 26.9* 23.7* 24.5* 23.6*  MCV 75.1* 75.1* 73.3* 73.6* 73.8*  --   PLT 363 415* 422* 394 410*  --    Basic Metabolic Panel: Recent Labs  Lab 01/25/18 0346 01/26/18 0407 01/27/18 0347 01/28/18 0345 01/29/18 0349  NA 137 137 136 136 136  K 3.6 3.5 3.4* 3.5 3.7  CL 102 102 99 99 98  CO2 27 24 26 25 26   GLUCOSE 106* 110* 112* 125* 117*  BUN 43* 55* 37* 52* 29*  CREATININE 3.13* 3.78* 3.15* 4.26* 2.96*  CALCIUM 7.8* 8.1* 7.5* 8.1* 8.0*  PHOS 2.7 3.8 2.7 3.6 3.0  GFR: Estimated Creatinine Clearance: 21.2 mL/min (A) (by C-G formula based on SCr of 2.96 mg/dL (H)). Liver Function Tests: Recent Labs  Lab 01/25/18 0346 01/26/18 0407 01/27/18 0347 01/28/18 0345 01/29/18 0349  ALBUMIN 1.6* 1.7* 1.6* 1.8* 1.8*   No results for input(s): LIPASE, AMYLASE in the last 168 hours. No results for input(s): AMMONIA in the last 168 hours. Coagulation Profile: Recent Labs  Lab 01/25/18 0346  INR 1.23   Cardiac Enzymes: No results for input(s): CKTOTAL, CKMB, CKMBINDEX, TROPONINI in the last 168 hours. BNP (last 3 results) No results for input(s): PROBNP in the last 8760 hours. HbA1C: No results for input(s): HGBA1C in the last 72 hours. CBG: No results for input(s): GLUCAP in the last 168 hours. Lipid Profile: No results for input(s): CHOL, HDL, LDLCALC, TRIG, CHOLHDL, LDLDIRECT in the last 72 hours. Thyroid Function Tests: No results for input(s): TSH, T4TOTAL, FREET4, T3FREE, THYROIDAB in the last 72 hours. Anemia Panel: No results for input(s): VITAMINB12, FOLATE, FERRITIN, TIBC, IRON, RETICCTPCT in the last 72 hours. Urine analysis:    Component Value Date/Time   COLORURINE YELLOW 01/13/2018 1058   APPEARANCEUR HAZY (A) 01/13/2018 1058   LABSPEC 1.014 01/13/2018 1058   PHURINE 5.0 01/13/2018 1058   GLUCOSEU NEGATIVE  01/13/2018 1058   HGBUR NEGATIVE 01/13/2018 1058   BILIRUBINUR NEGATIVE 01/13/2018 1058   KETONESUR NEGATIVE 01/13/2018 1058   PROTEINUR 100 (A) 01/13/2018 1058   NITRITE NEGATIVE 01/13/2018 1058   LEUKOCYTESUR NEGATIVE 01/13/2018 1058   Sepsis Labs: @LABRCNTIP (procalcitonin:4,lacticidven:4) )No results found for this or any previous visit (from the past 240 hour(s)).       Radiology Studies: Dg Chest 2 View  Result Date: 01/29/2018 CLINICAL DATA:  Patient admitted for leg swelling EXAM: CHEST - 2 VIEW COMPARISON:  Chest radiograph 01/14/2018 FINDINGS: Right anterior chest wall Port-A-Cath is present with tip projecting over the superior vena cava. Monitoring leads overlie the patient. Stable cardiomegaly. Low lung volumes. Bibasilar heterogeneous opacities, right greater than left. Small bilateral pleural effusions. Regional skeleton is unremarkable. IMPRESSION: Low lung volumes with small bilateral effusions and underlying opacities, similar to prior. Possibility of mass within the right lower lung not excluded. Electronically Signed   By: Lovey Newcomer M.D.   On: 01/29/2018 11:07   Mr Brain Wo Contrast  Result Date: 01/29/2018 CLINICAL DATA:  Altered level of consciousness. Chronic kidney disease and anemia. EXAM: MRI HEAD WITHOUT CONTRAST TECHNIQUE: Multiplanar, multiecho pulse sequences of the brain and surrounding structures were obtained without intravenous contrast. COMPARISON:  None. FINDINGS: Brain: The study is mildly degraded by patient motion. No acute infarct, hemorrhage, or mass lesion is present. Periventricular and subcortical white matter changes are mildly advanced for age. Mild generalized atrophy is noted. 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 White matter changes extend into the brainstem. A remote lacunar infarct is present in the right cerebellum. The internal auditory canals are within normal limits. The ventricles are of normal size. No  significant extraaxial fluid collection is present. Vascular: Flow is present in the major intracranial arteries. Skull and upper cervical spine: Craniocervical junction is normal. Decreased marrow signal is present in the upper cervical spine, compatible with anemia and kidney failure. Vertebral body heights are maintained. Degenerative changes are noted. Sinuses/Orbits: The paranasal sinuses and mastoid air cells are clear. A right lens replacement is present. Globes and orbits are otherwise within normal limits. IMPRESSION: 1. No acute intracranial abnormality to explain altered level of consciousness. 2. Atrophy and diffuse  white matter disease is mildly advanced for age. This likely reflects the sequela of chronic microvascular ischemia in this patient with chronic kidney disease. 3. 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 4. Decreased marrow signal most consistent with known anemia. Electronically Signed   By: San Morelle M.D.   On: 01/29/2018 11:45      Scheduled Meds: . sodium chloride   Intravenous Once  . amiodarone  400 mg Oral BID  . carvedilol  3.125 mg Oral BID WC  . Chlorhexidine Gluconate Cloth  6 each Topical Q0600  . feeding supplement (NEPRO CARB STEADY)  237 mL Oral TID BM  . heparin  5,000 Units Subcutaneous Q8H  . mouth rinse  15 mL Mouth Rinse BID  . mometasone-formoterol  2 puff Inhalation BID  . multivitamin with minerals  1 tablet Oral Daily  . Netarsudil-Latanoprost  1 drop Both Eyes QHS  . pantoprazole  40 mg Oral BID  . sodium chloride flush  3 mL Intravenous Q12H   Continuous Infusions: . sodium chloride       LOS: 20 days      Debbe Odea, MD [facility] Pager: [email or web address] Password [facility] 01/29/2018, 12:54 PM

## 2018-01-29 NOTE — Progress Notes (Signed)
Notified Dr. Wynelle Cleveland about pt's change in mentation through the night.  Pt was uncooperative, sitting in chair at doorway, refusing to get in bed or recliner.  Dr. Wynelle Cleveland was aware of his fall.  I let her know I am unable to obtain oxygen saturation, tried pulse ox in several different locations without success.  Wife is at bedside. This morning pt is confused about where he is and situation, he doesn't remember last night.  Pt refused to let me finish my assessment.   Dr. Wynelle Cleveland said she will come and see him. No new orders.

## 2018-01-29 NOTE — Progress Notes (Signed)
Will not perform I/O cath. Pt produced urine. Urinalysis specimen collected and sent to lab. Will continue to monitor. MD made aware.

## 2018-01-29 NOTE — Progress Notes (Signed)
Pt has not been able to urinate today.  He just made another unsuccessful attempt.  Will hold abx until after urination per order

## 2018-01-29 NOTE — Progress Notes (Signed)
   01/29/18 0543  What Happened  Was fall witnessed? No  Was patient injured? No  Patient found on floor  Found by Staff-comment  Stated prior activity ambulating-unassisted  Follow Up  MD notified Kirby,NP (yes)  Time MD notified 0501  Family notified Yes-comment Benjamine Mola Gearhart--patient's wife)  Time family notified [phone]  Additional tests Yes-comment  Progress note created (see row info) Yes  Adult Fall Risk Assessment  Risk Factor Category (scoring not indicated) Not Applicable  Age 78  Fall History: Fall within 6 months prior to admission 0  Elimination; Bowel and/or Urine Incontinence 0  Elimination; Bowel and/or Urine Urgency/Frequency 0  Medications: includes PCA/Opiates, Anti-convulsants, Anti-hypertensives, Diuretics, Hypnotics, Laxatives, Sedatives, and Psychotropics 3  Patient Care Equipment 0  Mobility-Assistance 0  Mobility-Gait 0  Mobility-Sensory Deficit 0  Altered awareness of immediate physical environment 1  Impulsiveness 2  Lack of understanding of one's physical/cognitive limitations 4  Total Score 12  Patient Fall Risk Level High fall risk  Adult Fall Risk Interventions  Required Bundle Interventions *See Row Information* High fall risk - low, moderate, and high requirements implemented  Screening for Fall Injury Risk (To be completed on HIGH fall risk patients) - Assessing Need for Low Bed  Risk For Fall Injury- Low Bed Criteria None identified - Continue screening  Pain Assessment  Pain Scale 0-10  Pain Score 0  Neurological  Neuro (WDL) X  Level of Consciousness Alert  Orientation Level Oriented to person;Oriented to place;Disoriented to time;Disoriented to situation  Cognition Impulsive;Poor judgement;Poor safety awareness  Speech Clear  Pupil Assessment  No  R Hand Grip Present;Moderate  L Hand Grip Present;Moderate   Neuro Additional Assessments No  Glasgow Coma Scale  Eye Opening 4  Best Verbal Response (NON-intubated) 2  Best Motor  Response 6  Glasgow Coma Scale Score 12  Musculoskeletal  Musculoskeletal (WDL) X  Assistive Device None  Generalized Weakness Yes  Weight Bearing Restrictions No  Integumentary  Integumentary (WDL) X  Skin Color Appropriate for ethnicity  Skin Condition Dry  Skin Integrity Intact  Skin Turgor Non-tenting

## 2018-01-29 NOTE — Progress Notes (Signed)
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 Found to be markedly fluid overloaded with increasing bilateral pleural effusions and lower extremity edema felt to be related to acute on chronic diastolic congestive heart failure. Poor respto IV Lasix, and had worsening renal function prompting dialysis initiation. He did have a prior work-up that was concerning for hemochromatosis per MRI in November 2018. He also noted some urinary hesitancy and weak stream.  1. Chronic kidney disease stage III with a baseline serum creatinine around 1.7-1.8. His serum creatinine worsened in December to 2.6. He is a urine protein to creatinine ratio of 1.4 g. Serologies so far have been negative. He required dialysis initiation on 12/30 for worsening azotemia and continued peripheral volume overload not responsive to Lasix drip. His last dialysis treatment was on Saturday. A repeat urine protein to creatinine ratio is pending to evaluate for possible nephrotic syndrome. We will plan for dialysis on a Tuesday, Thursday, Saturday dialysis schedule.UPC 1.5 w/ neg SPEP and very low Albumin. - Serologies ordered- ANCAneg, C3C4(NL), RF(min elev), antiGBMneg - ANA neg in 12/2017  -Appreciate Dr. Anselm Pancoast performing thebiopsy1/8 with 2 cores. No hematuria noted.  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 No fibrinoid necrosis or TIS but there is some glomerulosclerosis (not severe). We are waiting for the EM + PLA2R staining to determine if this is primary or secondary membranous. If primary then will tx with CTX or Rituximab -> I would favor Rituximab to limit the toxic CTX metabolite  bladder exposure in a pt who is currently on HD with minimal UOP. But tx decisions will not have to be made until results are back early next week and Dr. Jimmy Footman will be on service.  - Last HD Sat 1/11 with net UF 3L; next HD Tuesday.  - We've been checking routine bladder scan and last one 124ml on 1/9.  - This may be a prolonged course -> will speak with Freda Munro Monday to start CLIP process on Monday and placed order for VIR to convert to tunneled Mon.  Req Hepatitis serologies as well collected 1/12.   2. Acute on chronic heart failure with preserved ejection fraction. He has underlying RV dysfunction. Diuretics are on hold due to lack of efficacy. Will plan ultrafiltration on dialysis.  3. SVT with ultrafiltration. Will monitor electrolytes closely.  4. Acute hypoxic respiratory failure. Improving with volume removal on dialysis.  5. Acute GI bleed. Status post GI eval and packed RBCs.  6. Anemia secondary to acute blood loss. Continue packed RBCs as necessary.  Subjective:   Denies abd pain, diarrhea. No events overnight.  JOBS stockings are helping.  Still not making much urine   Objective:   BP (!) 94/55 (BP Location: Right Arm)   Pulse 77   Temp 98.3 F (36.8 C) (Oral)   Resp 18   Ht 5' 10.5" (1.791 m)   Wt 85.4 kg   SpO2 95%   BMI 26.63 kg/m   Intake/Output Summary (Last 24 hours) at 01/29/2018 0748 Last data filed at 01/28/2018 1653 Gross per 24 hour  Intake 363 ml  Output 3000 ml  Net -2637 ml   Weight change: 3.5 kg  Physical Exam: General: AAOx3 NAD, pleasant CV: Heart RRR  Lungs:  L/S CTA bilaterally Abd: abd SNT/ND with normal BS Extremities: 3+ bilateral lower extremity edema, JOBS stockings Access: RIJ temp  Imaging: No results found.  Labs: BMET Recent Labs  Lab 01/24/18 0357 01/24/18 0749 01/25/18 0346 01/26/18 0407 01/27/18 0347 01/28/18 0345 01/29/18 0349  NA 136 136 137 137 136 136 136  K 3.8 3.7 3.6  3.5 3.4* 3.5 3.7  CL 101 99 102 102 99 99 98  CO2 27 26 27 24 26 25 26   GLUCOSE 112* 111* 106* 110* 112* 125* 117*  BUN 74* 77* 43* 55* 37* 52* 29*  CREATININE 3.84* 3.77* 3.13* 3.78* 3.15* 4.26* 2.96*  CALCIUM 7.8* 8.0* 7.8* 8.1* 7.5* 8.1* 8.0*  PHOS 3.2 3.2 2.7 3.8 2.7 3.6 3.0   CBC Recent Labs  Lab 01/24/18 0750 01/26/18 1846 01/27/18 1311 01/28/18 1314  WBC 14.9* 30.8* 23.1* 15.6*  HGB 8.0* 8.5* 7.5* 7.8*  HCT 25.9* 26.9* 23.7* 24.5*  MCV 75.1* 73.3* 73.6* 73.8*  PLT 415* 422* 394 410*    Medications:    . amiodarone  400 mg Oral BID  . carvedilol  3.125 mg Oral BID WC  . Chlorhexidine Gluconate Cloth  6 each Topical Q0600  . feeding supplement (NEPRO CARB STEADY)  237 mL Oral TID BM  . heparin  5,000 Units Subcutaneous Q8H  . mouth rinse  15 mL Mouth Rinse BID  . mometasone-formoterol  2 puff Inhalation BID  . multivitamin with minerals  1 tablet Oral Daily  . Netarsudil-Latanoprost  1 drop Both Eyes QHS  . pantoprazole  40 mg Oral BID  . sodium chloride flush  3 mL Intravenous Q12H      Otelia Santee, MD 01/29/2018, 7:48 AM

## 2018-01-29 NOTE — Plan of Care (Signed)
  Problem: Clinical Measurements: Goal: Complications related to the disease process, condition or treatment will be avoided or minimized Outcome: Progressing   

## 2018-01-30 ENCOUNTER — Encounter: Payer: Self-pay | Admitting: Family Medicine

## 2018-01-30 DIAGNOSIS — N179 Acute kidney failure, unspecified: Secondary | ICD-10-CM

## 2018-01-30 LAB — PROTEIN ELECTROPHORESIS, SERUM
A/G Ratio: 0.7 (ref 0.7–1.7)
ALPHA-2-GLOBULIN: 1 g/dL (ref 0.4–1.0)
Albumin ELP: 2.1 g/dL — ABNORMAL LOW (ref 2.9–4.4)
Alpha-1-Globulin: 0.5 g/dL — ABNORMAL HIGH (ref 0.0–0.4)
Beta Globulin: 1 g/dL (ref 0.7–1.3)
Gamma Globulin: 0.6 g/dL (ref 0.4–1.8)
Globulin, Total: 3.1 g/dL (ref 2.2–3.9)
Total Protein ELP: 5.2 g/dL — ABNORMAL LOW (ref 6.0–8.5)

## 2018-01-30 LAB — RENAL FUNCTION PANEL
Albumin: 1.8 g/dL — ABNORMAL LOW (ref 3.5–5.0)
Anion gap: 10 (ref 5–15)
BUN: 47 mg/dL — AB (ref 8–23)
CHLORIDE: 99 mmol/L (ref 98–111)
CO2: 27 mmol/L (ref 22–32)
Calcium: 7.9 mg/dL — ABNORMAL LOW (ref 8.9–10.3)
Creatinine, Ser: 4.44 mg/dL — ABNORMAL HIGH (ref 0.61–1.24)
GFR calc Af Amer: 14 mL/min — ABNORMAL LOW (ref >60)
GFR calc non Af Amer: 12 mL/min — ABNORMAL LOW (ref >60)
Glucose, Bld: 109 mg/dL — ABNORMAL HIGH (ref 70–99)
POTASSIUM: 3.2 mmol/L — AB (ref 3.5–5.1)
Phosphorus: 3.3 mg/dL (ref 2.5–4.6)
Sodium: 136 mmol/L (ref 135–145)

## 2018-01-30 LAB — TYPE AND SCREEN
ABO/RH(D): B POS
Antibody Screen: NEGATIVE
Unit division: 0

## 2018-01-30 LAB — CBC
HEMATOCRIT: 26.8 % — AB (ref 39.0–52.0)
HEMOGLOBIN: 8.3 g/dL — AB (ref 13.0–17.0)
MCH: 22.9 pg — AB (ref 26.0–34.0)
MCHC: 31 g/dL (ref 30.0–36.0)
MCV: 73.8 fL — ABNORMAL LOW (ref 80.0–100.0)
Platelets: 344 10*3/uL (ref 150–400)
RBC: 3.63 MIL/uL — ABNORMAL LOW (ref 4.22–5.81)
RDW: 21.9 % — ABNORMAL HIGH (ref 11.5–15.5)
WBC: 14 10*3/uL — ABNORMAL HIGH (ref 4.0–10.5)
nRBC: 0.2 % (ref 0.0–0.2)

## 2018-01-30 LAB — BPAM RBC
Blood Product Expiration Date: 202002102359
ISSUE DATE / TIME: 202001121335
Unit Type and Rh: 7300

## 2018-01-30 MED ORDER — CEFAZOLIN SODIUM-DEXTROSE 2-4 GM/100ML-% IV SOLN
2.0000 g | INTRAVENOUS | Status: AC
  Filled 2018-01-30: qty 100

## 2018-01-30 MED ORDER — WITCH HAZEL-GLYCERIN EX PADS
MEDICATED_PAD | CUTANEOUS | Status: DC | PRN
  Filled 2018-01-30 (×2): qty 100

## 2018-01-30 MED ORDER — RENA-VITE PO TABS
1.0000 | ORAL_TABLET | Freq: Every day | ORAL | Status: DC
  Administered 2018-01-30 – 2018-02-08 (×10): 1 via ORAL
  Filled 2018-01-30 (×10): qty 1

## 2018-01-30 MED ORDER — CHLORHEXIDINE GLUCONATE CLOTH 2 % EX PADS
6.0000 | MEDICATED_PAD | Freq: Every day | CUTANEOUS | Status: DC
  Administered 2018-02-01: 6 via TOPICAL

## 2018-01-30 NOTE — Progress Notes (Signed)
Chart reviewed. Telemetry reviewed. Pt in sinus this am. Continue amiodarone. Volume management by HD per Nephrology.  No new medication recommendations today.  Please call us with questions. We will follow from a distance this week.   Lauree Chandler 01/30/2018 8:18 AM

## 2018-01-30 NOTE — Progress Notes (Addendum)
PROGRESS NOTE    Andrew Guerra   MGQ:[id number]  DOB: 27-Jan-1940  DOA: 01/09/2018 PCP: [name], Andrew Guerra   Brief Narrative:  [name] of HTN, HLD, bicuspid aortic valve with moderate AS, NSCLC s/p resection and radiation,HTN and PUD (duodenal and gastric ulcers) admitted from PCPs office on 01/09/2018 with hemoglobin of 6.4. He was hemoccult + in ED.  Also noted to be fluid overloaded and started on lasix. Continued to have renal failure and fluid overload not responding to Lasix and started on HD 12/30. Course complicated by PAF.    Subjective: Appears back to baseline- today he remembers falling (but somehow did not remember this yesterday). Continues to have a cough.   Assessment & Plan:   Principal Problem: Delirium and fall 1/11 night - We do not know if his confusion started prior to or after the Ambien- fall occurred after the Ambien - hold Ambien- avoid all sedatives -  MRI brain negative - CXR shows infiltrates (see below)  - back to baseline today-   Active Problems:  ? Pneumonia, leukocytosis, hypoxia - 1/12> bibasilar infiltrates right > left- per RN he has a cough- pulse ox is about 86% on room air-he did have these infiltrates on admission but no cough and thus suspected to be atelectasis- at this time, will treat for pneumonia due to cough/ hypoxia/ leukocytosis -  started Ceftriaxone and Azithromycin- continues to have a cough today- follow - start IS  + UA - this was checked when he was confused -  Today he tells me that he is barely making urine at this point and has no symptoms    Hemoccult +/ PUD - noted to be hemoccult + with anemia on admission - EGD 01/10/18 with gastric and duodenal ulcers- treated for H pylori - pathology showed> CHRONIC GASTRITIS AND INTESTINAL METAPLASIA - on BID PPI - f/u serial hemoccults- first hemoccult neg   Anemia  - due to chronic bleeding - last Hb on 12/21/17 8.4 - Hb on 01/09/18 was 6.2 (also fluid  overloaded and possibly dilutional) - s/p 2 U PRBC which brought Hb up to ~8- 9 - Hb down to 7.3 on 1/22 andl transfused 1 U PRBC bringing it up to 8.3  AKI on  CKD 3  - fluid overloaded - Cr on 11/1 was 1.79 and has subsequently worsened to 3-4 range - needed to start dialysis on 12/30 as his volume overload did not respond to a Lasix infusion - s/p right renal biopsy 01/25/18- awaiting final biopsy report - management per nephrology- they are pursuing CLIP process, tunneled cath  Episodes of SVT, h/o PAF CHA2DS2-VASc Score 3 - has had a cardiology eval - recommended plan for Amiodarone>   400 mg twice a day x 1 wk (starting 01/22/2018),  then 200 twice daily for 2 weeks and then 200 once a day thereafter (will start ~ 1/25) - cont Coreg 3.125 mg bid  -  no anticoagulation for now due above mentioned to PUD and bleeding- will need to reconsider anticoagulation once ulcers healed - will follow hemoccults   Hemachromatosis? - noted on MRI on 11/22 to have iron depositions in spleen and liver - Scheduled to see hematology outpatient but not until 03/2018-  - d/w Dr Benay Spice today- recommended to obtain a hemochromatosis PCR and if elevated, f/u as outpt (probably in [facility])   Moderate 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 - stable per cardiology  NSCLC- left upper lung -  post prior surgery and radiation     Malnutrition of moderate degree    - cont supplements   Moderate pulm HTN, moderate RV failure, mild to mod Ao stenosis with bicuspid Ao valve, grade 1 d CHF - H/o Rheumatic fever     Time spent in minutes: 35  DVT prophylaxis: SCDS Code Status: Full code Family Communication: wife at bedside  Disposition Plan:   Per nephrology Consultants:   Cardiology  GI  Nephrology  IR Procedures:   EGD 12/24 LA Class A reflux esophagitis. - Non-bleeding gastric ulcers with no stigmata of bleeding. Biopsied. - Recent diagnosis of intestinal metaplasia and H pylori.  Biopsies obtained for mapping and to document response to therapy, respectively. - Multiple non-bleeding duodenal ulcers with no stigmata of bleeding.  TTE 01/13/18 Impressions: EF 60 to 65% without regional wall motion normalities, - Compared to the exam from 10/24/17, the degree of right ventricular dilation has increased, and right ventricular systolic function has decreased. The degree of leftward septal shift and D shaped septum has worsened. There is now a trivial-small pericardial effusion. Side by side comparison of images performed  Antimicrobials:  Anti-infectives (From admission, onward)   Start     Dose/Rate Route Frequency Ordered Stop   01/30/18 1000  azithromycin (ZITHROMAX) tablet 250 mg     250 mg Oral Daily 01/29/18 1305 02/03/18 0959   01/30/18 0630  ceFAZolin (ANCEF) IVPB 2g/100 mL premix     2 g 200 mL/hr over 30 Minutes Intravenous To Radiology 01/30/18 0621 01/31/18 0630   01/29/18 1400  cefTRIAXone (ROCEPHIN) 1 g in sodium chloride 0.9 % 100 mL IVPB     1 g 200 mL/hr over 30 Minutes Intravenous Every 24 hours 01/29/18 1305     01/29/18 1400  azithromycin (ZITHROMAX) tablet 500 mg     500 mg Oral Daily 01/29/18 1305 01/29/18 2242   01/16/18 1230  ceFAZolin (ANCEF) IVPB 2g/100 mL premix    Note to Pharmacy:  Pre IR procedure, procedure time 12/30 not yet known   2 g 200 mL/hr over 30 Minutes Intravenous To Surgery 01/15/18 1112 01/17/18 1230       Objective: Vitals:   01/30/18 0742 01/30/18 1110 01/30/18 1152 01/30/18 1347  BP:   (!) 108/55 (!) 100/59  Pulse:   67 65  Resp:    18  Temp:    97.6 F (36.4 C)  TempSrc:      SpO2: 91% 90%  (!) 88%  Weight:      Height:        Intake/Output Summary (Last 24 hours) at 01/30/2018 1456 Last data filed at 01/30/2018 1400 Gross per 24 hour  Intake 968 ml  Output -  Net 968 ml   Filed Weights   01/28/18 1248 01/28/18 1653 01/30/18 0500  Weight: 88.2 kg 85.4 kg 87.9 kg     Examination: General exam: Appears comfortable  HEENT: PERRLA, oral mucosa moist, no sclera icterus or thrush Respiratory system: Clear to auscultation. Respiratory effort normal. Pulse ox > 90% on room air Cardiovascular system: S1 & S2 heard,  No murmurs  Gastrointestinal system: Abdomen soft, non-tender, nondistended. Normal bowel sound. No organomegaly Central nervous system: Alert and oriented x 3 today- No focal weakness.  Extremities: No cyanosis, clubbing or edema Skin: No rashes or ulcers Psychiatry:  Mood & affect appropriate.     Data Reviewed: I have personally reviewed following labs and imaging studies  CBC: Recent Labs  Lab 01/24/18 0750 01/26/18  1846 01/27/18 1311 01/28/18 1314 01/29/18 0739 01/29/18 2014 01/30/18 0359  WBC 14.9* 30.8* 23.1* 15.6*  --   --  14.0*  HGB 8.0* 8.5* 7.5* 7.8* 7.3* 8.5* 8.3*  HCT 25.9* 26.9* 23.7* 24.5* 23.6* 27.7* 26.8*  MCV 75.1* 73.3* 73.6* 73.8*  --   --  73.8*  PLT 415* 422* 394 410*  --   --  875   Basic Metabolic Panel: Recent Labs  Lab 01/26/18 0407 01/27/18 0347 01/28/18 0345 01/29/18 0349 01/30/18 0359  NA 137 136 136 136 136  K 3.5 3.4* 3.5 3.7 3.2*  CL 102 99 99 98 99  CO2 24 26 25 26 27   GLUCOSE 110* 112* 125* 117* 109*  BUN 55* 37* 52* 29* 47*  CREATININE 3.78* 3.15* 4.26* 2.96* 4.44*  CALCIUM 8.1* 7.5* 8.1* 8.0* 7.9*  PHOS 3.8 2.7 3.6 3.0 3.3   GFR: Estimated Creatinine Clearance: 14.2 mL/min (A) (by C-G formula based on SCr of 4.44 mg/dL (H)). Liver Function Tests: Recent Labs  Lab 01/26/18 0407 01/27/18 0347 01/28/18 0345 01/29/18 0349 01/30/18 0359  ALBUMIN 1.7* 1.6* 1.8* 1.8* 1.8*   No results for input(s): LIPASE, AMYLASE in the last 168 hours. No results for input(s): AMMONIA in the last 168 hours. Coagulation Profile: Recent Labs  Lab 01/25/18 0346  INR 1.23   Cardiac Enzymes: No results for input(s): CKTOTAL, CKMB, CKMBINDEX, TROPONINI in the last 168 hours. BNP (last 3  results) No results for input(s): PROBNP in the last 8760 hours. HbA1C: No results for input(s): HGBA1C in the last 72 hours. CBG: No results for input(s): GLUCAP in the last 168 hours. Lipid Profile: No results for input(s): CHOL, HDL, LDLCALC, TRIG, CHOLHDL, LDLDIRECT in the last 72 hours. Thyroid Function Tests: No results for input(s): TSH, T4TOTAL, FREET4, T3FREE, THYROIDAB in the last 72 hours. Anemia Panel: No results for input(s): VITAMINB12, FOLATE, FERRITIN, TIBC, IRON, RETICCTPCT in the last 72 hours. Urine analysis:    Component Value Date/Time   COLORURINE AMBER (A) 01/29/2018 2258   APPEARANCEUR CLOUDY (A) 01/29/2018 2258   LABSPEC 1.024 01/29/2018 2258   PHURINE 5.0 01/29/2018 2258   GLUCOSEU NEGATIVE 01/29/2018 2258   HGBUR MODERATE (A) 01/29/2018 2258   BILIRUBINUR SMALL (A) 01/29/2018 2258   KETONESUR 5 (A) 01/29/2018 2258   PROTEINUR 100 (A) 01/29/2018 2258   NITRITE NEGATIVE 01/29/2018 2258   LEUKOCYTESUR LARGE (A) 01/29/2018 2258   Sepsis Labs: @LABRCNTIP (procalcitonin:4,lacticidven:4) )No results found for this or any previous visit (from the past 240 hour(s)).       Radiology Studies: Dg Chest 2 View  Result Date: 01/29/2018 CLINICAL DATA:  Patient admitted for leg swelling EXAM: CHEST - 2 VIEW COMPARISON:  Chest radiograph 01/14/2018 FINDINGS: Right anterior chest wall Port-A-Cath is present with tip projecting over the superior vena cava. Monitoring leads overlie the patient. Stable cardiomegaly. Low lung volumes. Bibasilar heterogeneous opacities, right greater than left. Small bilateral pleural effusions. Regional skeleton is unremarkable. IMPRESSION: Low lung volumes with small bilateral effusions and underlying opacities, similar to prior. Possibility of mass within the right lower lung not excluded. Electronically Signed   By: Lovey Newcomer M.D.   On: 01/29/2018 11:07   Mr Brain Wo Contrast  Result Date: 01/29/2018 CLINICAL DATA:  Altered level of  consciousness. Chronic kidney disease and anemia. EXAM: MRI HEAD WITHOUT CONTRAST TECHNIQUE: Multiplanar, multiecho pulse sequences of the brain and surrounding structures were obtained without intravenous contrast. COMPARISON:  None. FINDINGS: Brain: The study is mildly degraded by  patient motion. No acute infarct, hemorrhage, or mass lesion is present. Periventricular and subcortical white matter changes are mildly advanced for age. Mild generalized atrophy is noted. 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 White matter changes extend into the brainstem. A remote lacunar infarct is present in the right cerebellum. The internal auditory canals are within normal limits. The ventricles are of normal size. No significant extraaxial fluid collection is present. Vascular: Flow is present in the major intracranial arteries. Skull and upper cervical spine: Craniocervical junction is normal. Decreased marrow signal is present in the upper cervical spine, compatible with anemia and kidney failure. Vertebral body heights are maintained. Degenerative changes are noted. Sinuses/Orbits: The paranasal sinuses and mastoid air cells are clear. A right lens replacement is present. Globes and orbits are otherwise within normal limits. IMPRESSION: 1. No acute intracranial abnormality to explain altered level of consciousness. 2. Atrophy and diffuse white matter disease is mildly advanced for age. This likely reflects the sequela of chronic microvascular ischemia in this patient with chronic kidney disease. 3. 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 4. Decreased marrow signal most consistent with known anemia. Electronically Signed   By: San Morelle M.D.   On: 01/29/2018 11:45      Scheduled Meds: . sodium chloride   Intravenous Once  . amiodarone  200 mg Oral BID  . azithromycin  250 mg Oral Daily  . Chlorhexidine Gluconate Cloth  6 each Topical Q0600   . [START ON 01/31/2018] Chlorhexidine Gluconate Cloth  6 each Topical Q0600  . feeding supplement (NEPRO CARB STEADY)  237 mL Oral TID BM  . heparin  5,000 Units Subcutaneous Q8H  . mouth rinse  15 mL Mouth Rinse BID  . mometasone-formoterol  2 puff Inhalation BID  . multivitamin  1 tablet Oral QHS  . Netarsudil-Latanoprost  1 drop Both Eyes QHS  . pantoprazole  40 mg Oral BID  . sodium chloride flush  3 mL Intravenous Q12H   Continuous Infusions: . sodium chloride    .  ceFAZolin (ANCEF) IV    . cefTRIAXone (ROCEPHIN)  IV 1 g (01/29/18 2252)     LOS: 21 days      Andrew Odea, Andrew Guerra [facility] Pager: [email or web address] Password [facility] 01/30/2018, 2:56 PM

## 2018-01-30 NOTE — Progress Notes (Signed)
Subjective: Interval History: has complaints confused about whole picture.  Objective: Vital signs in last 24 hours: Temp:  [97.4 F (36.3 C)-98.4 F (36.9 C)] 98.2 F (36.8 C) (01/13 0438) Pulse Rate:  [64-83] 67 (01/13 1152) Resp:  [20-30] 20 (01/13 0438) BP: (81-137)/(46-114) 108/55 (01/13 1152) SpO2:  [89 %-100 %] 90 % (01/13 1110) Weight:  [87.9 kg] 87.9 kg (01/13 0500) Weight change: -0.3 kg  Intake/Output from previous day: 01/12 0701 - 01/13 0700 In: 1211 [P.O.:240; I.V.:53; Blood:338; NG/GT:480; IV Piggyback:100] Out: -  Intake/Output this shift: No intake/output data recorded.  General appearance: cooperative, no distress, pale and slowed mentation Neck: RIJ cath Resp: rales bibasilar Cardio: S1, S2 normal and systolic murmur: systolic ejection 2/6, crescendo and decrescendo at 2nd left intercostal space GI: slft, pos bs, liver down 5 cm Extremities: edema 3+  Lab Results: Recent Labs    01/28/18 1314  01/29/18 2014 01/30/18 0359  WBC 15.6*  --   --  14.0*  HGB 7.8*   < > 8.5* 8.3*  HCT 24.5*   < > 27.7* 26.8*  PLT 410*  --   --  344   < > = values in this interval not displayed.   BMET:  Recent Labs    01/29/18 0349 01/30/18 0359  NA 136 136  K 3.7 3.2*  CL 98 99  CO2 26 27  GLUCOSE 117* 109*  BUN 29* 47*  CREATININE 2.96* 4.44*  CALCIUM 8.0* 7.9*   No results for input(s): PTH in the last 72 hours. Iron Studies: No results for input(s): IRON, TIBC, TRANSFERRIN, FERRITIN in the last 72 hours.  Studies/Results: Dg Chest 2 View  Result Date: 01/29/2018 CLINICAL DATA:  Patient admitted for leg swelling EXAM: CHEST - 2 VIEW COMPARISON:  Chest radiograph 01/14/2018 FINDINGS: Right anterior chest wall Port-A-Cath is present with tip projecting over the superior vena cava. Monitoring leads overlie the patient. Stable cardiomegaly. Low lung volumes. Bibasilar heterogeneous opacities, right greater than left. Small bilateral pleural effusions. Regional  skeleton is unremarkable. IMPRESSION: Low lung volumes with small bilateral effusions and underlying opacities, similar to prior. Possibility of mass within the right lower lung not excluded. Electronically Signed   By: Lovey Newcomer M.D.   On: 01/29/2018 11:07   Mr Brain Wo Contrast  Result Date: 01/29/2018 CLINICAL DATA:  Altered level of consciousness. Chronic kidney disease and anemia. EXAM: MRI HEAD WITHOUT CONTRAST TECHNIQUE: Multiplanar, multiecho pulse sequences of the brain and surrounding structures were obtained without intravenous contrast. COMPARISON:  None. FINDINGS: Brain: The study is mildly degraded by patient motion. No acute infarct, hemorrhage, or mass lesion is present. Periventricular and subcortical white matter changes are mildly advanced for age. Mild generalized atrophy is noted. 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 White matter changes extend into the brainstem. A remote lacunar infarct is present in the right cerebellum. The internal auditory canals are within normal limits. The ventricles are of normal size. No significant extraaxial fluid collection is present. Vascular: Flow is present in the major intracranial arteries. Skull and upper cervical spine: Craniocervical junction is normal. Decreased marrow signal is present in the upper cervical spine, compatible with anemia and kidney failure. Vertebral body heights are maintained. Degenerative changes are noted. Sinuses/Orbits: The paranasal sinuses and mastoid air cells are clear. A right lens replacement is present. Globes and orbits are otherwise within normal limits. IMPRESSION: 1. No acute intracranial abnormality to explain altered level of consciousness. 2. Atrophy and diffuse white  matter disease is mildly advanced for age. This likely reflects the sequela of chronic microvascular ischemia in this patient with chronic kidney disease. 3. 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 4. Decreased marrow signal most consistent with known anemia. Electronically Signed   By: San Morelle M.D.   On: 01/29/2018 11:45    I have reviewed the patient's current medications.  Assessment/Plan: 1 AKI oliguric, need bladder scans.  Cause ?? 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  Await bx.vol xs needs lowered at HD. 2 Malnutrition ?? 3 Anemia check Fe, use esa 4 HPTH check 5 Hx lung Ca 6 Aortic valve dz 7 Afib now NSR on amio 8 Debill P HD,check Fe, PTH, bladder scans    LOS: 21 days   Jeneen Rinks Evlyn Amason 01/30/2018,12:45 PM

## 2018-01-30 NOTE — Progress Notes (Addendum)
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  Ongoing  GOAL:   Patient will meet greater than or equal to 90% of their needs  Progressing  MONITOR:   Diet advancement, Labs, Weight trends, Supplement acceptance, I & O's  REASON FOR ASSESSMENT:   Malnutrition Screening Tool    ASSESSMENT:   78 year old male who presented to the ED on 12/23 with low hemoglobin. Pt with recent admission for GI bleed related to PUD. PMH significant for CHF, HTN, HLD, NSCLC of right lung.  1/2- s/p R heart cath  1/8- renal biopsy to determine renal function  1/12- Fall on floor ambulating unassisted  Pt was alert and oriented when DI entered the room. Pt's wife at bedside. Pt reported that he has been making himself eat over the past few days in the hospital because he knows he must. Pt states nothing from the cafeteria taste well since he isn't allowed to have any salt for seasoning. Pt stated that he has been drinking his Nepro supplements, but he doesn't like them as much as ensures and he reports he has been drinking them to get stronger. DI encouraged pt to order foods that he likes to eat and to continue drinking nutrition supplements.   Pt currently NPO since 1/12  Meal Completion: 25% breakfast 1/12  Medications reviewed and include: Normal saline infusions  Coreg Rocephin Rena-Vit  Labs  reviewed: K 3.2 (L)  BUN 47 (H)  Creatinine 4.44 (H)   UOP: oliguric Net UF on 1/11: 3,000 ml Net Negative 11.4L since admission Post HD weight on 1/11 85.4 kg, 1/13 87.9 kg.   Weight down total of 31 lbs since admission. Suspect some related to fluid status.   Diet Order:   Diet Order            Diet NPO time specified Except for: Sips with Meds  Diet effective now              EDUCATION NEEDS:   Education needs have been addressed  Skin:  Skin Assessment: Skin Integrity Issues: Skin Integrity Issues:: Stage II Stage II: bilateral ears  Last BM:  1/9- Type 7  Height:   Ht Readings from Last 1 Encounters:  01/09/18 5' 10.5" (1.791 m)    Weight:   Wt Readings from Last 1 Encounters:  01/30/18 87.9 kg    Ideal Body Weight:  76.8 kg  BMI:  Body mass index is 27.41 kg/m.  Estimated Nutritional Needs:   Kcal:  2000-2200  Protein:  100-115 grams  Fluid:  >/= 2.0 L  Mauricia Area, MS, Dietetic Intern Pager: [phone] After hours Pager: [phone]

## 2018-01-30 NOTE — Consult Note (Signed)
Chief Complaint: Patient was seen in consultation today for temporary dialysis catheter to tunneled dialysis catheter placement Chief Complaint  Patient presents with  . low hgb   at the request of Dr Lenna Sciara Deterding   Supervising Physician: Marybelle Killings  Patient Status: [facility] - [facility]pt  History of Present Illness: Andrew Guerra is a 78 y.o. male   AKI; oliguric  Was scheduled in IR for tunneled dialysis catheter 12/30 But Pt developed leukocytosis and new onset A fib Non tunneled right IJ dialysis catheter was placed and pt sent back to floor from IR  Plan was to replace temp cath with tunneled catheter when appropriate  Pt now with wbc trending down - today 14 Afebrile Still need for ongoing dialysis MD requesting tunneled catheter today. Hep injections held Npo  Consent signed in chart    Past Medical History:  Diagnosis Date  . 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  [name] Cataract    right eye - surgery to remove  . Elevated PSA 11/29/2016   Patient is followed by [facility].  Most recent PSA near 11.  He has had atypia on biopsy.  November 2008  . Essential hypertension, benign 06/08/2012  . Full dentures   . Glaucoma   . Heart murmur    since childhood, never has caused any problems  . History of rheumatic fever 11/04/2015  . Hyperlipidemia 04/25/2013  . Hypertension   . Malignant neoplasm of left lung ([place]) 02/08/2017  . RBBB 07/12/2017  . Sickle cell trait ([place])    no problems per patient    Past Surgical History:  Procedure Laterality Date  . BIOPSY  11/11/2017   Procedure: BIOPSY;  Surgeon: Lavena Bullion, DO;  Location: [facility];  Service: Gastroenterology;;  . BIOPSY  01/10/2018   Procedure: BIOPSY;  Surgeon: [name], MD;  Location: [facility];  Service: Gastroenterology;;  . cataract eye surgery Right     . COLONOSCOPY  11/2009   hx polyps/[place]  . ESOPHAGOGASTRODUODENOSCOPY N/A 11/11/2017   Procedure: ESOPHAGOGASTRODUODENOSCOPY (EGD);  Surgeon: Lavena Bullion, DO;  Location: [facility];  Service: Gastroenterology;  Laterality: N/A;  . ESOPHAGOGASTRODUODENOSCOPY (EGD) WITH PROPOFOL N/A 01/10/2018   Procedure: ESOPHAGOGASTRODUODENOSCOPY (EGD) WITH PROPOFOL;  Surgeon: [name], MD;  Location: [facility];  Service: Gastroenterology;  Laterality: N/A;  . IR FLUORO GUIDE CV LINE RIGHT  01/16/2018  . IR US GUIDE VASC ACCESS RIGHT  01/16/2018  . PROSTATE BIOPSY    . right eye surgery     to lower eye pressure  . RIGHT HEART CATH N/A 01/19/2018   Procedure: RIGHT HEART CATH;  Surgeon: Nelva Bush, MD;  Location: [facility];  Service: Cardiovascular;  Laterality: N/A;  . TONSILLECTOMY    . VIDEO ASSISTED THORACOSCOPY (VATS)/ LOBECTOMY Right 12/26/2015  . wisdom tteeth ext      Allergies: Atorvastatin  Medications: Prior to Admission medications   Medication Sig Start Date End Date Taking? Authorizing Provider  amLODipine (NORVASC) 2.5 MG tablet Take 1 tablet (2.5 mg total) by mouth daily. 11/07/17  Yes [name], MD  aspirin EC 81 MG tablet Take 81 mg by mouth daily.   Yes [provider]  Brinzolamide-Brimonidine (SIMBRINZA) 1-0.2 % SUSP Place 2-3 drops into both eyes 3 (three) times daily.    Yes [provider]  budesonide-formoterol (SYMBICORT) 80-4.5 MCG/ACT inhaler Inhale  2 puffs into the lungs 2 (two) times daily. 11/01/17  Yes [name], MD  metoprolol succinate (TOPROL-XL) 50 MG 24 hr tablet Take 1/2 tablet daily.Take with or immediately following a meal. 06/23/17  Yes Luking, Elayne Snare, MD  Multiple Vitamin (MULTIVITAMIN) tablet Take 1 tablet by mouth daily.   Yes [provider]  Netarsudil-Latanoprost (ROCKLATAN) 0.02-0.005 % SOLN Place 1 drop into both eyes at bedtime.    Yes Marylynn Pearson, MD  pantoprazole (PROTONIX)  40 MG tablet Take 1 tablet (40 mg total) by mouth 2 (two) times daily. 11/30/17 01/29/18 Yes Willia Craze, NP  ketoconazole (NIZORAL) 2 % cream Apply to area twice daily Patient not taking: Reported on 01/09/2018 01/03/18   [name], MD  torsemide [facility]) 20 MG tablet Take two tablets by mouth every morning Patient not taking: Reported on 01/09/2018 01/09/18   [name], MD     Family History  Problem Relation Age of Onset  . Diabetes Mother   . Cerebral aneurysm Mother   . Lung cancer Father   . Diabetes Sister   . Colon cancer Neg Hx   . Colon polyps Neg Hx   . Esophageal cancer Neg Hx   . Rectal cancer Neg Hx   . Stomach cancer Neg Hx     Social History   Socioeconomic History  . Marital status: Married    Spouse name: Not on file  . Number of children: Not on file  . Years of education: Not on file  . Highest education level: Not on file  Occupational History  . Not on file  Social Needs  . Financial resource strain: Not on file  . Food insecurity:    Worry: Not on file    Inability: Not on file  . Transportation needs:    Medical: Not on file    Non-medical: Not on file  Tobacco Use  . Smoking status: Former Smoker    Packs/day: 1.00    Types: Cigarettes    Last attempt to quit: 01/19/2011    Years since quitting: 7.0  . Smokeless tobacco: Never Used  Substance and Sexual Activity  . Alcohol use: Yes    Alcohol/week: 1.0 standard drinks    Types: 1 Shots of liquor per week  . Drug use: No  . Sexual activity: Not on file  Lifestyle  . Physical activity:    Days per week: Not on file    Minutes per session: Not on file  . Stress: Not on file  Relationships  . Social connections:    Talks on phone: Not on file    Gets together: Not on file    Attends religious service: Not on file    Active member of club or organization: Not on file    Attends meetings of clubs or organizations: Not on file    Relationship status: Not on file  Other  Topics Concern  . Not on file  Social History Narrative  . Not on file     Review of Systems: A 12 point ROS discussed and pertinent positives are indicated in the HPI above.  All other systems are negative.  Review of Systems  Constitutional: Positive for activity change. Negative for fatigue and fever.  Gastrointestinal: Negative for abdominal pain and nausea.  Neurological: Positive for weakness.  Psychiatric/Behavioral: Negative for behavioral problems and confusion.    Vital Signs: BP (!) 100/59 (BP Location: Right Arm)   Pulse 65   Temp  97.6 F (36.4 C)   Resp 18   Ht 5' 10.5" (1.791 m)   Wt 193 lb 12.6 oz (87.9 kg)   SpO2 (!) 88%   BMI 27.41 kg/m   Physical Exam Vitals signs reviewed.  Cardiovascular:     Rate and Rhythm: Normal rate and regular rhythm.  Pulmonary:     Breath sounds: Normal breath sounds.  Abdominal:     General: Bowel sounds are normal.     Palpations: Abdomen is soft.  Musculoskeletal: Normal range of motion.  Skin:    General: Skin is warm and dry.     Comments: Right IJ non tunneled catheter in place  Neurological:     General: No focal deficit present.     Mental Status: He is alert.  Psychiatric:        Mood and Affect: Mood normal.        Behavior: Behavior normal.        Thought Content: Thought content normal.        Judgment: Judgment normal.     Imaging: Ct Abdomen Pelvis Wo Contrast  Result Date: 01/09/2018 CLINICAL DATA:  Low hemoglobin. EXAM: CT ABDOMEN AND PELVIS WITHOUT CONTRAST TECHNIQUE: Multidetector CT imaging of the abdomen and pelvis was performed following the standard protocol without IV contrast. COMPARISON:  CT 02/04/2015, abdomen MRI 12/09/2017 FINDINGS: Lower chest: Small bilateral pleural effusions. Cardiomegaly without pericardial effusion or thickening. Atherosclerosis of the included thoracic aorta without aneurysm. Coronary arteriosclerosis is seen. Pulmonary consolidation/atelectasis in the right middle  lobe distribution. Hepatobiliary: Layering biliary sludge within the gallbladder without secondary signs of acute cholecystitis. The unenhanced liver demonstrates a granuloma in the right hepatic lobe. No definite mass or biliary dilatation. Pancreas: Unremarkable. No pancreatic ductal dilatation or surrounding inflammatory changes. Spleen: Normal size spleen with scattered calcifications noted within possibly representing vascular calcifications or granulomata. Adrenals/Urinary Tract: Scattered bilateral hyperdense lesions of the kidneys are nonspecific but may represent proteinaceous or hemorrhagic cysts, the largest is right-sided and interpolar measuring up 1.6 cm with more ill-defined hyperdensity slightly exophytic off the right kidney. No nephrolithiasis or obstructive uropathy. The urinary bladder is unremarkable for the degree of distention. Stomach/Bowel: Stomach is within normal limits. The appendix is not confidently identified. No evidence of bowel wall thickening, distention, or inflammatory changes. Vascular/Lymphatic: Aortic atherosclerosis. No lymphadenopathy. Reproductive: Prostatomegaly measuring up to 5.7 cm. Other: Diffuse soft tissue anasarca. No ascites or free air. 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 IMPRESSION: 1. Pulmonary consolidation/atelectasis in the right middle lobe with small bilateral pleural effusions. 2. Scattered bilateral hyperdense lesions of the kidneys likely to represent proteinaceous or hemorrhagic cysts, the largest is in the right interpolar and interpolar measuring 1.6 cm with more ill-defined hyperdensity slightly exophytic off the right kidney. Findings better characterized on recent prior MRI. 3. Hepatic and splenic granulomata. 4. Diffuse soft tissue anasarca. 5. 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 Aortic Atherosclerosis (ICD10-I70.0). Electronically Signed   By: Ashley Royalty M.D.   On: 01/09/2018 20:33   Dg Chest 2 View  Result Date:  01/29/2018 CLINICAL DATA:  Patient admitted for leg swelling EXAM: CHEST - 2 VIEW COMPARISON:  Chest radiograph 01/14/2018 FINDINGS: Right anterior chest wall Port-A-Cath is present with tip projecting over the superior vena cava. Monitoring leads overlie the patient. Stable cardiomegaly. Low lung volumes. Bibasilar heterogeneous opacities, right greater than left. Small bilateral pleural effusions. Regional skeleton is unremarkable. IMPRESSION: Low lung volumes with small bilateral effusions and underlying opacities, similar to prior.  Possibility of mass within the right lower lung not excluded. Electronically Signed   By: Lovey Newcomer M.D.   On: 01/29/2018 11:07   Dg Chest 2 View  Result Date: 01/14/2018 CLINICAL DATA:  Shortness of breath EXAM: CHEST - 2 VIEW COMPARISON:  Chest radiograph 01/09/2018 FINDINGS: Monitoring leads overlie the patient. Stable cardiac and mediastinal contours. Similar-appearing patchy consolidation within the right greater than left lower lungs. Moderate right and small left pleural effusions. No pneumothorax. IMPRESSION: Similar-appearing patchy consolidation within the right greater than left lower lungs with moderate right and small left pleural effusions. Electronically Signed   By: Lovey Newcomer M.D.   On: 01/14/2018 16:08   Mr Brain Wo Contrast  Result Date: 01/29/2018 CLINICAL DATA:  Altered level of consciousness. Chronic kidney disease and anemia. EXAM: MRI HEAD WITHOUT CONTRAST TECHNIQUE: Multiplanar, multiecho pulse sequences of the brain and surrounding structures were obtained without intravenous contrast. COMPARISON:  None. FINDINGS: Brain: The study is mildly degraded by patient motion. No acute infarct, hemorrhage, or mass lesion is present. Periventricular and subcortical white matter changes are mildly advanced for age. Mild generalized atrophy is noted. 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 White matter changes extend into the  brainstem. A remote lacunar infarct is present in the right cerebellum. The internal auditory canals are within normal limits. The ventricles are of normal size. No significant extraaxial fluid collection is present. Vascular: Flow is present in the major intracranial arteries. Skull and upper cervical spine: Craniocervical junction is normal. Decreased marrow signal is present in the upper cervical spine, compatible with anemia and kidney failure. Vertebral body heights are maintained. Degenerative changes are noted. Sinuses/Orbits: The paranasal sinuses and mastoid air cells are clear. A right lens replacement is present. Globes and orbits are otherwise within normal limits. IMPRESSION: 1. No acute intracranial abnormality to explain altered level of consciousness. 2. Atrophy and diffuse white matter disease is mildly advanced for age. This likely reflects the sequela of chronic microvascular ischemia in this patient with chronic kidney disease. 3. 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 4. Decreased marrow signal most consistent with known anemia. Electronically Signed   By: San Morelle M.D.   On: 01/29/2018 11:45   US Renal  Result Date: 01/13/2018 CLINICAL DATA:  Oliguria.  Elevated PSA. EXAM: RENAL / URINARY TRACT ULTRASOUND COMPLETE COMPARISON:  CT abdomen and pelvis 01/09/2018. FINDINGS: Right Kidney: Renal measurements: 10.5 x 4.6 x 5.0 cm = volume: 126.8 mL . Echogenicity within normal limits. A simple cyst measuring 1.9 cm in diameter is identified. No hydronephrosis visualized. Trace amount of perihepatic ascites is noted. Left Kidney: Renal measurements: 11.4 x 5.5 x 5.1 cm = volume: 163.2 mL. Echogenicity within normal limits. No mass or hydronephrosis visualized. Bladder: The bladder is almost completely decompressed. No focal abnormality. Prostatomegaly is seen. IMPRESSION: Negative for hydronephrosis or acute abnormality. Prostatomegaly. Trace  amount of ascites. Electronically Signed   By: Inge Rise M.D.   On: 01/13/2018 11:29   Ir Fluoro Guide Cv Line Right  Result Date: 01/16/2018 INDICATION: 78 year old male with acute renal failure in need of hemodialysis. We had an initial plan for placement of a tunneled hemodialysis catheter, however he has leukocytosis today as well as new onset atrial fibrillation with uncontrolled rapid ventricular response. Therefore, we will place a non tunneled hemodialysis catheter to allow him to get hemodialysis quickly without the additional stress of moderate sedation. We can then convert the non tunneled  catheter to a tunneled device once his other issues have been addressed. EXAM: IR RIGHT FLOURO GUIDE CV LINE; IR ULTRASOUND GUIDANCE VASC ACCESS RIGHT MEDICATIONS: None ANESTHESIA/SEDATION: None FLUOROSCOPY TIME:  Fluoroscopy Time: 0 minutes 12 seconds (1 mGy). COMPLICATIONS: None immediate. PROCEDURE: Informed written consent was obtained from the patient after a thorough discussion of the procedural risks, benefits and alternatives. All questions were addressed. Maximal Sterile Barrier Technique was utilized including caps, mask, sterile gowns, sterile gloves, sterile drape, hand hygiene and skin antiseptic. A timeout was performed prior to the initiation of the procedure. The right internal jugular vein was interrogated with ultrasound and found to be widely patent. An image was obtained and stored for the medical record. Local anesthesia was attained by infiltration with 1% lidocaine. A small dermatotomy was made. Under real-time sonographic guidance, the vessel was punctured with an 18 gauge needle. A 0.035 wire was advanced into the inferior vena cava. The needle was removed. The skin tract was dilated and a 16 cm triple-lumen hemodialysis catheter was advanced over the wire and positioned with the catheter tip at the superior cavoatrial junction. The catheter was aspirated and then flushed with  heparinized saline. The catheter was then secured to the skin with 0 Prolene suture and a sterile bandage. The patient tolerated the procedure well. IMPRESSION: Successful placement of a non tunneled hemodialysis catheter via the right internal jugular vein. The tip of the catheter is at the superior cavoatrial junction and the catheter is ready for immediate use. Electronically Signed   By: Jacqulynn Cadet M.D.   On: 01/16/2018 14:05   Ir US Guide Vasc Access Right  Result Date: 01/16/2018 INDICATION: 78 year old male with acute renal failure in need of hemodialysis. We had an initial plan for placement of a tunneled hemodialysis catheter, however he has leukocytosis today as well as new onset atrial fibrillation with uncontrolled rapid ventricular response. Therefore, we will place a non tunneled hemodialysis catheter to allow him to get hemodialysis quickly without the additional stress of moderate sedation. We can then convert the non tunneled catheter to a tunneled device once his other issues have been addressed. EXAM: IR RIGHT FLOURO GUIDE CV LINE; IR ULTRASOUND GUIDANCE VASC ACCESS RIGHT MEDICATIONS: None ANESTHESIA/SEDATION: None FLUOROSCOPY TIME:  Fluoroscopy Time: 0 minutes 12 seconds (1 mGy). COMPLICATIONS: None immediate. PROCEDURE: Informed written consent was obtained from the patient after a thorough discussion of the procedural risks, benefits and alternatives. All questions were addressed. Maximal Sterile Barrier Technique was utilized including caps, mask, sterile gowns, sterile gloves, sterile drape, hand hygiene and skin antiseptic. A timeout was performed prior to the initiation of the procedure. The right internal jugular vein was interrogated with ultrasound and found to be widely patent. An image was obtained and stored for the medical record. Local anesthesia was attained by infiltration with 1% lidocaine. A small dermatotomy was made. Under real-time sonographic guidance, the vessel  was punctured with an 18 gauge needle. A 0.035 wire was advanced into the inferior vena cava. The needle was removed. The skin tract was dilated and a 16 cm triple-lumen hemodialysis catheter was advanced over the wire and positioned with the catheter tip at the superior cavoatrial junction. The catheter was aspirated and then flushed with heparinized saline. The catheter was then secured to the skin with 0 Prolene suture and a sterile bandage. The patient tolerated the procedure well. IMPRESSION: Successful placement of a non tunneled hemodialysis catheter via the right internal jugular vein. The tip of the catheter  is at the superior cavoatrial junction and the catheter is ready for immediate use. Electronically Signed   By: Jacqulynn Cadet M.D.   On: 01/16/2018 14:05   Dg Chest Portable 1 View  Result Date: 01/09/2018 CLINICAL DATA:  Shortness of breath for 1 day. 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 EXAM: PORTABLE CHEST 1 VIEW COMPARISON:  Radiographs 11/09/2017. Abdominal MRI 12/09/2017. PET-CT 11/25/2015. FINDINGS: 1902 hours. There are right-greater-than-left pleural effusions which have enlarged. There is associated bibasilar pulmonary opacity, likely atelectasis. There are stable postsurgical changes in the right hilar region. No pneumothorax or acute osseous findings are seen. IMPRESSION: Enlarging right-greater-than-left pleural effusions with associated bibasilar pulmonary opacities, likely atelectasis. No invert pulmonary edema. Electronically Signed   By: Richardean Sale M.D.   On: 01/09/2018 19:35   US Biopsy (kidney)  Result Date: 01/25/2018 INDICATION: 78 year old with acute on chronic renal failure. Request for renal biopsy. EXAM: ULTRASOUND-GUIDED RIGHT RENAL BIOPSY MEDICATIONS: None. ANESTHESIA/SEDATION: Moderate (conscious) sedation was employed during this procedure. A total of Versed 1.0 mg and Fentanyl 50 mcg was administered intravenously. Moderate Sedation Time: 11  minutes. The patient's level of consciousness and vital signs were monitored continuously by radiology nursing throughout the procedure under my direct supervision. FLUOROSCOPY TIME:  None COMPLICATIONS: None immediate. PROCEDURE: Informed written consent was obtained from the patient after a thorough discussion of the procedural risks, benefits and alternatives. All questions were addressed. Both kidneys were evaluated with ultrasound. The right kidney was targeted for biopsy. Right flank was prepped with chlorhexidine and sterile field was created. Maximal barrier sterile technique was utilized including mask, sterile gowns, sterile gloves, sterile drape, hand hygiene and skin antiseptic. Skin was anesthetized with 1% lidocaine. 37 gauge core biopsy needle was directed into the right kidney lower pole with ultrasound guidance. Two core biopsies were obtained. Core biopsies appeared adequate and placed in saline. Bandage placed over the puncture site. FINDINGS: Negative for hydronephrosis. Core biopsies obtained from the right kidney lower pole. No significant bleeding or hematoma formation at the end of the procedure. IMPRESSION: Ultrasound-guided core biopsies of the right kidney lower pole. Electronically Signed   By: Markus Daft M.D.   On: 01/25/2018 17:13    Labs:  CBC: Recent Labs    01/26/18 1846 01/27/18 1311 01/28/18 1314 01/29/18 0739 01/29/18 2014 01/30/18 0359  WBC 30.8* 23.1* 15.6*  --   --  14.0*  HGB 8.5* 7.5* 7.8* 7.3* 8.5* 8.3*  HCT 26.9* 23.7* 24.5* 23.6* 27.7* 26.8*  PLT 422* 394 410*  --   --  344    COAGS: Recent Labs    11/09/17 1703 01/09/18 1651 01/25/18 0346  INR 1.09 1.21 1.23    BMP: Recent Labs    01/27/18 0347 01/28/18 0345 01/29/18 0349 01/30/18 0359  NA 136 136 136 136  K 3.4* 3.5 3.7 3.2*  CL 99 99 98 99  CO2 26 25 26 27   GLUCOSE 112* 125* 117* 109*  BUN 37* 52* 29* 47*  CALCIUM 7.5* 8.1* 8.0* 7.9*  CREATININE 3.15* 4.26* 2.96* 4.44*    GFRNONAA 18* 12* 19* 12*  GFRAA 21* 14* 22* 14*    LIVER FUNCTION TESTS: Recent Labs    01/09/18 1651 01/13/18 0354  01/16/18 0456  01/21/18 0509  01/27/18 0347 01/28/18 0345 01/29/18 0349 01/30/18 0359  BILITOT 0.4 0.5  --  0.4  --  <0.1*  --   --   --   --   --   AST  56* 40  --  26  --  24  --   --   --   --   --   ALT 33 41  --  36  --  30  --   --   --   --   --   ALKPHOS 82 92  --  78  --  74  --   --   --   --   --   PROT 4.7* 4.7*  --  4.7*  --  4.6*  --   --   --   --   --   ALBUMIN 1.6* 1.6*   < > 1.9*   < > 1.6*   < > 1.6* 1.8* 1.8* 1.8*   < > = values in this interval not displayed.    TUMOR MARKERS: No results for input(s): AFPTM, CEA, CA199, CHROMGRNA in the last 8760 hours.  Assessment and Plan:  AKI Worsening renal function Non tunneled catheter intact Nephrology request for tunneled catheter  Scheduled for this procedure today Hep held; npo Risks and benefits discussed with the patient including, but not limited to bleeding, infection, vascular injury, pneumothorax which may require chest tube placement, air embolism or even death  All of the patient's questions were answered, patient is agreeable to proceed. Consent signed and in chart.  Thank you for this interesting consult.  I greatly enjoyed meeting Andrew Guerra and look forward to participating in their care.  A copy of this report was sent to the requesting provider on this date.  Electronically Signed: Lavonia Drafts, PA-C 01/30/2018, 2:03 PM   I spent a total of 20 Minutes    in face to face in clinical consultation, greater than 50% of which was counseling/coordinating care for tunneled dialysis catheter placement

## 2018-01-31 ENCOUNTER — Inpatient Hospital Stay (HOSPITAL_COMMUNITY): Payer: Medicare Other

## 2018-01-31 ENCOUNTER — Encounter (HOSPITAL_COMMUNITY): Payer: Self-pay | Admitting: Interventional Radiology

## 2018-01-31 HISTORY — PX: IR FLUORO GUIDE CV LINE RIGHT: IMG2283

## 2018-01-31 HISTORY — PX: IR US GUIDE VASC ACCESS RIGHT: IMG2390

## 2018-01-31 LAB — RENAL FUNCTION PANEL
Albumin: 1.6 g/dL — ABNORMAL LOW (ref 3.5–5.0)
Anion gap: 10 (ref 5–15)
BUN: 59 mg/dL — ABNORMAL HIGH (ref 8–23)
CHLORIDE: 100 mmol/L (ref 98–111)
CO2: 26 mmol/L (ref 22–32)
Calcium: 8 mg/dL — ABNORMAL LOW (ref 8.9–10.3)
Creatinine, Ser: 5.23 mg/dL — ABNORMAL HIGH (ref 0.61–1.24)
GFR calc Af Amer: 11 mL/min — ABNORMAL LOW (ref >60)
GFR calc non Af Amer: 10 mL/min — ABNORMAL LOW (ref >60)
Glucose, Bld: 102 mg/dL — ABNORMAL HIGH (ref 70–99)
Phosphorus: 3.9 mg/dL (ref 2.5–4.6)
Potassium: 3 mmol/L — ABNORMAL LOW (ref 3.5–5.1)
Sodium: 136 mmol/L (ref 135–145)

## 2018-01-31 LAB — CBC
HCT: 25 % — ABNORMAL LOW (ref 39.0–52.0)
Hemoglobin: 7.6 g/dL — ABNORMAL LOW (ref 13.0–17.0)
MCH: 22.5 pg — ABNORMAL LOW (ref 26.0–34.0)
MCHC: 30.4 g/dL (ref 30.0–36.0)
MCV: 74 fL — ABNORMAL LOW (ref 80.0–100.0)
Platelets: 316 10*3/uL (ref 150–400)
RBC: 3.38 MIL/uL — ABNORMAL LOW (ref 4.22–5.81)
RDW: 22.5 % — ABNORMAL HIGH (ref 11.5–15.5)
WBC: 12.3 10*3/uL — ABNORMAL HIGH (ref 4.0–10.5)
nRBC: 0 % (ref 0.0–0.2)

## 2018-01-31 LAB — IRON AND TIBC
Iron: 10 ug/dL — ABNORMAL LOW (ref 45–182)
SATURATION RATIOS: 5 % — AB (ref 17.9–39.5)
TIBC: 206 ug/dL — ABNORMAL LOW (ref 250–450)
UIBC: 196 ug/dL

## 2018-01-31 LAB — HEPATITIS B SURFACE ANTIGEN: Hepatitis B Surface Ag: NEGATIVE

## 2018-01-31 LAB — HEPATITIS B SURFACE ANTIBODY, QUANTITATIVE: Hep B S AB Quant (Post): <3.1 m[IU]/mL — ABNORMAL LOW (ref >9.9)

## 2018-01-31 LAB — HEPATITIS B CORE ANTIBODY, TOTAL: Hep B Core Total Ab: NEGATIVE

## 2018-01-31 LAB — OCCULT BLOOD X 1 CARD TO LAB, STOOL: Fecal Occult Bld: NEGATIVE

## 2018-01-31 LAB — HEPATITIS C ANTIBODY: HCV Ab: <0.1 s/co ratio (ref 0.0–0.9)

## 2018-01-31 MED ORDER — MIDAZOLAM HCL 2 MG/2ML IJ SOLN
INTRAMUSCULAR | Status: AC
  Filled 2018-01-31: qty 2

## 2018-01-31 MED ORDER — FENTANYL CITRATE (PF) 100 MCG/2ML IJ SOLN
INTRAMUSCULAR | Status: AC | PRN
  Administered 2018-01-31: 25 ug via INTRAVENOUS

## 2018-01-31 MED ORDER — CEFAZOLIN (ANCEF) 1 G IV SOLR
INTRAVENOUS | Status: AC | PRN
  Administered 2018-01-31: 2 g

## 2018-01-31 MED ORDER — LIDOCAINE-PRILOCAINE 2.5-2.5 % EX CREA: 1.0000 "application " | TOPICAL_CREAM | CUTANEOUS | Status: DC | PRN

## 2018-01-31 MED ORDER — HEPARIN SODIUM (PORCINE) 1000 UNIT/ML IJ SOLN
INTRAMUSCULAR | Status: AC
  Administered 2018-01-31: 3.2 mL
  Filled 2018-01-31: qty 1

## 2018-01-31 MED ORDER — SODIUM CHLORIDE 0.9 % IV SOLN: 100.0000 mL | INTRAVENOUS | Status: DC | PRN

## 2018-01-31 MED ORDER — SODIUM CHLORIDE 0.9 % IV SOLN
510.0000 mg | Freq: Once | INTRAVENOUS | Status: AC
  Administered 2018-01-31: 510 mg via INTRAVENOUS
  Filled 2018-01-31: qty 17

## 2018-01-31 MED ORDER — PENTAFLUOROPROP-TETRAFLUOROETH EX AERO: 1.0000 "application " | INHALATION_SPRAY | CUTANEOUS | Status: DC | PRN

## 2018-01-31 MED ORDER — SODIUM CHLORIDE 0.9 % IV SOLN
INTRAVENOUS | Status: AC | PRN
  Administered 2018-01-31: 10 mL/h via INTRAVENOUS

## 2018-01-31 MED ORDER — ALTEPLASE 2 MG IJ SOLR: 2.0000 mg | Freq: Once | INTRAMUSCULAR | Status: DC | PRN

## 2018-01-31 MED ORDER — LIDOCAINE HCL (PF) 1 % IJ SOLN: 5.0000 mL | INTRAMUSCULAR | Status: DC | PRN

## 2018-01-31 MED ORDER — HEPARIN SODIUM (PORCINE) 1000 UNIT/ML IJ SOLN
INTRAMUSCULAR | Status: AC
  Filled 2018-01-31: qty 3

## 2018-01-31 MED ORDER — HEPARIN SODIUM (PORCINE) 1000 UNIT/ML DIALYSIS: 1000.0000 [IU] | INTRAMUSCULAR | Status: DC | PRN

## 2018-01-31 MED ORDER — CEFAZOLIN SODIUM-DEXTROSE 2-4 GM/100ML-% IV SOLN
2.0000 g | Freq: Once | INTRAVENOUS | Status: DC
  Filled 2018-01-31: qty 100

## 2018-01-31 MED ORDER — LIDOCAINE HCL (PF) 1 % IJ SOLN
INTRAMUSCULAR | Status: AC | PRN
  Administered 2018-01-31: 10 mL

## 2018-01-31 MED ORDER — MIDAZOLAM HCL 2 MG/2ML IJ SOLN
INTRAMUSCULAR | Status: AC | PRN
  Administered 2018-01-31: 0.5 mg via INTRAVENOUS

## 2018-01-31 MED ORDER — CHLORHEXIDINE GLUCONATE 4 % EX LIQD
CUTANEOUS | Status: AC
  Filled 2018-01-31: qty 15

## 2018-01-31 MED ORDER — LIDOCAINE HCL 1 % IJ SOLN
INTRAMUSCULAR | Status: AC
  Filled 2018-01-31: qty 20

## 2018-01-31 MED ORDER — CEFAZOLIN SODIUM-DEXTROSE 2-4 GM/100ML-% IV SOLN
INTRAVENOUS | Status: AC
  Filled 2018-01-31: qty 100

## 2018-01-31 MED ORDER — FENTANYL CITRATE (PF) 100 MCG/2ML IJ SOLN
INTRAMUSCULAR | Status: AC
  Filled 2018-01-31: qty 2

## 2018-01-31 NOTE — Procedures (Signed)
I was present at this session.  I have reviewed the session itself and made appropriate changes.  HD via temp cath, bp 80-110.  tol vol off. Still vol xs.  Jeneen Rinks Mio Schellinger 1/14/202012:32 PM

## 2018-01-31 NOTE — Progress Notes (Addendum)
PROGRESS NOTE    Andrew Guerra   UVO:[id number]  DOB: [birth year]  DOA: 01/09/2018 PCP: [name], MD   Brief Narrative:  Andrew Guerra of HTN, HLD, bicuspid aortic valve with moderate AS, NSCLC s/p resection and radiation,HTN and PUD (duodenal and gastric ulcers) admitted from PCPs office on 01/09/2018 with hemoglobin of 6.4. He was hemoccult + in ED.  Also noted to be fluid overloaded and started on lasix. Continued to have renal failure and fluid overload not responding to Lasix and started on HD 12/30. Course complicated by PAF. He became confused and had a fall on 1/11. Currently treating for pneumonia +/- UTI   Subjective: He states his cough is a little better. No complaints today.   Assessment & Plan:   Principal Problem: Delirium and fall 1/11 night - We do not know if his confusion started prior to or after the Ambien- fall occurred after the Ambien - hold Ambien- avoid all sedatives -  MRI brain negative - CXR shows infiltrates (see below)  - back to baseline    Active Problems:   Pneumonia, leukocytosis, hypoxia - 1/12> bibasilar infiltrates right > left- per RN he has a cough- pulse ox is about 86% on room air-he did have these infiltrates on admission but no cough and thus suspected to be atelectasis- at this time, have started treatment for pneumonia due to cough/ hypoxia/ leukocytosis -  started Ceftriaxone and Azithromycin- continues to have a cough today but improving- WBC improving now- follow - started IS  + UA/ UTI - this was checked when he was confused- barely making urine now and cannot tell if he has symptoms of a UTI- growing E coli- follow   Hemoccult +/ PUD - noted to be hemoccult + with anemia on admission - EGD 01/10/18 with gastric and duodenal ulcers- treated for H pylori - pathology showed> CHRONIC GASTRITIS AND INTESTINAL METAPLASIA - on BID PPI - f/u serial hemoccults- first hemoccult neg   Anemia  - due to chronic bleeding and  Iron deficiency - last Hb on 12/21/17 8.4 - Hb on 01/09/18 was 6.2 (also fluid overloaded and possibly dilutional) - s/p 2 U PRBC which brought Hb up to ~8- 9 - Hb down to 7.3 on 1/22 andl transfused 1 U PRBC bringing it up to 8.3 - Hb 7.6 again today but needs dialysis so may be dilutional- follow- hemoccults are now negative - receiving Feraheme today  AKI on  CKD 3  - fluid overloaded - Cr on 11/1 was 1.79 and has subsequently worsened to 3-4 range - needed to start dialysis on 12/30 as his volume overload did not respond to a Lasix infusion - 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 - renal suspecting ATN as the cause of his AKI  - management per nephrology- they are pursuing CLIP process & recommending a tunneled cath    Episodes of SVT, h/o PAF CHA2DS2-VASc Score 3 - has had a cardiology eval - recommended plan for Amiodarone>   400 mg twice a day x 1 wk (starting 01/22/2018),  then 200 twice daily for 2 weeks and then 200 once a day thereafter (will start ~ 1/25) - cont Coreg 3.125 mg bid  -  no anticoagulation for now due above mentioned to PUD and bleeding- will need to reconsider anticoagulation once ulcers healed -   following hemoccults which are currently negative  Hemachromatosis? - noted on MRI on 11/22 to have iron depositions in spleen and  liver - Scheduled to see hematology outpatient but not until 03/2018-  - d/w Dr Benay Spice 1/13- recommended to obtain a hemochromatosis PCR (done 1/13 and takes ~ 1 wk to come back)and if elevated, f/u as outpt (probably in [place])   Moderate 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 - stable per cardiology  NSCLC- right upper lobe - treated in 2017  At [facility] with RUL lobectomy- had right lung recurrence s/p SBRT(radiation) - in remission- last f/u was 9/19 at [facility]- he follows up every 6 mo    Malnutrition of moderate degree    - cont supplements   Moderate pulm HTN, moderate RV failure, mild to mod Ao  stenosis with bicuspid Ao valve, grade 1 d CHF - H/o Rheumatic fever     Time spent in minutes: 35  DVT prophylaxis: SCDS Code Status: Full code Family Communication: wife at bedside  Disposition Plan:   Per nephrology Consultants:   Cardiology  GI  Nephrology  IR Procedures:   EGD 12/24 LA Class A reflux esophagitis. - Non-bleeding gastric ulcers with no stigmata of bleeding. Biopsied. - Recent diagnosis of intestinal metaplasia and H pylori. Biopsies obtained for mapping and to document response to therapy, respectively. - Multiple non-bleeding duodenal ulcers with no stigmata of bleeding.  TTE 01/13/18 Impressions: EF 60 to 65% without regional wall motion normalities, - Compared to the exam from 10/24/17, the degree of right ventricular dilation has increased, and right ventricular systolic function has decreased. The degree of leftward septal shift and D shaped septum has worsened. There is now a trivial-small pericardial effusion. Side by side comparison of images performed  Antimicrobials:  Anti-infectives (From admission, onward)   Start     Dose/Rate Route Frequency Ordered Stop   01/30/18 1000  azithromycin (ZITHROMAX) tablet 250 mg     250 mg Oral Daily 01/29/18 1305 02/03/18 0959   01/30/18 0630  ceFAZolin (ANCEF) IVPB 2g/100 mL premix     2 g 200 mL/hr over 30 Minutes Intravenous To Radiology 01/30/18 0621 01/31/18 0630   01/29/18 1400  cefTRIAXone (ROCEPHIN) 1 g in sodium chloride 0.9 % 100 mL IVPB     1 g 200 mL/hr over 30 Minutes Intravenous Every 24 hours 01/29/18 1305     01/29/18 1400  azithromycin (ZITHROMAX) tablet 500 mg     500 mg Oral Daily 01/29/18 1305 01/29/18 2242   01/16/18 1230  ceFAZolin (ANCEF) IVPB 2g/100 mL premix    Note to Pharmacy:  Pre IR procedure, procedure time 12/30 not yet known   2 g 200 mL/hr over 30 Minutes Intravenous To Surgery 01/15/18 1112 01/17/18 1230       Objective: Vitals:   01/31/18 1100  01/31/18 1130 01/31/18 1158 01/31/18 1337  BP: (!) 96/50 (!) 100/52 (!) 106/56 (!) 112/48  Pulse: 64 65 65 69  Resp:   (!) 25 18  Temp:   (!) 97.4 F (36.3 C) 98 F (36.7 C)  TempSrc:   Oral Oral  SpO2:   100% 100%  Weight:   82.9 kg   Height:        Intake/Output Summary (Last 24 hours) at 01/31/2018 1413 Last data filed at 01/31/2018 1400 Gross per 24 hour  Intake 402.14 ml  Output 2304 ml  Net -1901.86 ml   Filed Weights   01/31/18 0500 01/31/18 0735 01/31/18 1158  Weight: 85.3 kg 85.2 kg 82.9 kg    Examination: General exam: Appears comfortable  HEENT: PERRLA, oral mucosa moist, no sclera  icterus or thrush Respiratory system: Clear to auscultation. Respiratory effort normal. Cardiovascular system: S1 & S2 heard,  No murmurs  Gastrointestinal system: Abdomen soft, non-tender, nondistended. Normal bowel sound. No organomegaly Central nervous system: Alert and oriented. No focal neurological deficits. Extremities: No cyanosis, clubbing or edema Skin: No rashes or ulcers Psychiatry:  Mood & affect appropriate.    Data Reviewed: I have personally reviewed following labs and imaging studies  CBC: Recent Labs  Lab 01/26/18 1846 01/27/18 1311 01/28/18 1314 01/29/18 0739 01/29/18 2014 01/30/18 0359 01/31/18 0747  WBC 30.8* 23.1* 15.6*  --   --  14.0* 12.3*  HGB 8.5* 7.5* 7.8* 7.3* 8.5* 8.3* 7.6*  HCT 26.9* 23.7* 24.5* 23.6* 27.7* 26.8* 25.0*  MCV 73.3* 73.6* 73.8*  --   --  73.8* 74.0*  PLT 422* 394 410*  --   --  344 426   Basic Metabolic Panel: Recent Labs  Lab 01/27/18 0347 01/28/18 0345 01/29/18 0349 01/30/18 0359 01/31/18 0203  NA 136 136 136 136 136  K 3.4* 3.5 3.7 3.2* 3.0*  CL 99 99 98 99 100  CO2 26 25 26 27 26   GLUCOSE 112* 125* 117* 109* 102*  BUN 37* 52* 29* 47* 59*  CREATININE 3.15* 4.26* 2.96* 4.44* 5.23*  CALCIUM 7.5* 8.1* 8.0* 7.9* 8.0*  PHOS 2.7 3.6 3.0 3.3 3.9   GFR: Estimated Creatinine Clearance: 12 mL/min (A) (by C-G formula  based on SCr of 5.23 mg/dL (H)). Liver Function Tests: Recent Labs  Lab 01/27/18 0347 01/28/18 0345 01/29/18 0349 01/30/18 0359 01/31/18 0203  ALBUMIN 1.6* 1.8* 1.8* 1.8* 1.6*   No results for input(s): LIPASE, AMYLASE in the last 168 hours. No results for input(s): AMMONIA in the last 168 hours. Coagulation Profile: Recent Labs  Lab 01/25/18 0346  INR 1.23   Cardiac Enzymes: No results for input(s): CKTOTAL, CKMB, CKMBINDEX, TROPONINI in the last 168 hours. BNP (last 3 results) No results for input(s): PROBNP in the last 8760 hours. HbA1C: No results for input(s): HGBA1C in the last 72 hours. CBG: No results for input(s): GLUCAP in the last 168 hours. Lipid Profile: No results for input(s): CHOL, HDL, LDLCALC, TRIG, CHOLHDL, LDLDIRECT in the last 72 hours. Thyroid Function Tests: No results for input(s): TSH, T4TOTAL, FREET4, T3FREE, THYROIDAB in the last 72 hours. Anemia Panel: Recent Labs    01/31/18 0747  TIBC 206*  IRON 10*   Urine analysis:    Component Value Date/Time   COLORURINE AMBER (A) 01/29/2018 2258   APPEARANCEUR CLOUDY (A) 01/29/2018 2258   LABSPEC 1.024 01/29/2018 2258   PHURINE 5.0 01/29/2018 2258   GLUCOSEU NEGATIVE 01/29/2018 2258   HGBUR MODERATE (A) 01/29/2018 2258   BILIRUBINUR SMALL (A) 01/29/2018 2258   KETONESUR 5 (A) 01/29/2018 2258   PROTEINUR 100 (A) 01/29/2018 2258   NITRITE NEGATIVE 01/29/2018 2258   LEUKOCYTESUR LARGE (A) 01/29/2018 2258   Sepsis Labs: @LABRCNTIP (procalcitonin:4,lacticidven:4) ) Recent Results (from the past 240 hour(s))  Culture, Urine     Status: Abnormal (Preliminary result)   Collection Time: 01/30/18  7:59 AM  Result Value Ref Range Status   Specimen Description URINE, RANDOM  Final   Special Requests NONE  Final   Culture (A)  Final    >=100,000 COLONIES/mL ESCHERICHIA COLI CULTURE REINCUBATED FOR BETTER GROWTH Performed at [facility] [address], [place], [place] [address]    Report  Status PENDING  Incomplete         Radiology Studies: No results  found.    Scheduled Meds: . sodium chloride   Intravenous Once  . amiodarone  200 mg Oral BID  . azithromycin  250 mg Oral Daily  . Chlorhexidine Gluconate Cloth  6 each Topical Q0600  . Chlorhexidine Gluconate Cloth  6 each Topical Q0600  . feeding supplement (NEPRO CARB STEADY)  237 mL Oral TID BM  . heparin  5,000 Units Subcutaneous Q8H  . mouth rinse  15 mL Mouth Rinse BID  . mometasone-formoterol  2 puff Inhalation BID  . multivitamin  1 tablet Oral QHS  . Netarsudil-Latanoprost  1 drop Both Eyes QHS  . pantoprazole  40 mg Oral BID  . sodium chloride flush  3 mL Intravenous Q12H   Continuous Infusions: . sodium chloride    . cefTRIAXone (ROCEPHIN)  IV 200 mL/hr at 01/31/18 1400  . ferumoxytol       LOS: 22 days      Debbe Odea, MD [facility] Pager: [email or web address] Password [facility] 01/31/2018, 2:13 PM

## 2018-01-31 NOTE — Progress Notes (Signed)
Subjective: Interval History: has no complaint.  Objective: Vital signs in last 24 hours: Temp:  [97.5 F (36.4 C)-97.8 F (36.6 C)] 97.6 F (36.4 C) (01/14 0735) Pulse Rate:  [59-96] 65 (01/14 1130) Resp:  [18-26] 26 (01/14 0747) BP: (85-132)/(32-69) 100/52 (01/14 1130) SpO2:  [88 %-99 %] 99 % (01/14 0735) Weight:  [85.2 kg-85.3 kg] 85.2 kg (01/14 0735) Weight change: -2.6 kg  Intake/Output from previous day: 01/13 0701 - 01/14 0700 In: 340 [P.O.:240; IV Piggyback:100] Out: -  Intake/Output this shift: No intake/output data recorded.  General appearance: cooperative, no distress and slowed mentation Resp: rales bibasilar Cardio: S1, S2 normal and systolic murmur: systolic ejection 2/6, crescendo and decrescendo at 2nd left intercostal space GI: soft, liver down 5 cm Extremities: edema 3+  Lab Results: Recent Labs    01/30/18 0359 01/31/18 0747  WBC 14.0* 12.3*  HGB 8.3* 7.6*  HCT 26.8* 25.0*  PLT 344 316   BMET:  Recent Labs    01/30/18 0359 01/31/18 0203  NA 136 136  K 3.2* 3.0*  CL 99 100  CO2 27 26  GLUCOSE 109* 102*  BUN 47* 59*  CREATININE 4.44* 5.23*  CALCIUM 7.9* 8.0*   No results for input(s): PTH in the last 72 hours. Iron Studies:  Recent Labs    01/31/18 0747  IRON 10*  TIBC 206*    Studies/Results: No results found.  I have reviewed the patient's current medications.  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  Vol xs yet low bps. tol HD. Slowly lower vol.  Very little urine in bladder.Needs PC 2 afib resolved 3 GIB resolved 4 Anemia needs Fe,  5 Hx NSLC he does not remembet 6 Renal mass 7 Debill 8 intermittent confusion 9 Aortic valve disease P HD, iv Fe, follow urine vol , get PC     LOS: 22 days   Ramzey Alisse Tuite 01/31/2018,12:36 PM

## 2018-01-31 NOTE — Procedures (Signed)
  Procedure: R EJ tunneled HD catheter   EBL:   minimal Complications:  none immediate  See full dictation in [facility].  Dillard Cannon MD Main # [phone] Pager  [phone]

## 2018-02-01 LAB — RENAL FUNCTION PANEL
Albumin: 1.6 g/dL — ABNORMAL LOW (ref 3.5–5.0)
Anion gap: 8 (ref 5–15)
BUN: 30 mg/dL — ABNORMAL HIGH (ref 8–23)
CHLORIDE: 105 mmol/L (ref 98–111)
CO2: 25 mmol/L (ref 22–32)
Calcium: 7.7 mg/dL — ABNORMAL LOW (ref 8.9–10.3)
Creatinine, Ser: 3.55 mg/dL — ABNORMAL HIGH (ref 0.61–1.24)
GFR calc Af Amer: 18 mL/min — ABNORMAL LOW (ref >60)
GFR calc non Af Amer: 15 mL/min — ABNORMAL LOW (ref >60)
Glucose, Bld: 99 mg/dL (ref 70–99)
Phosphorus: 2.6 mg/dL (ref 2.5–4.6)
Potassium: 3.4 mmol/L — ABNORMAL LOW (ref 3.5–5.1)
Sodium: 138 mmol/L (ref 135–145)

## 2018-02-01 LAB — URINE CULTURE: Culture: >=100000 — AB

## 2018-02-01 LAB — PTH, INTACT AND CALCIUM
Calcium, Total (PTH): 8 mg/dL — ABNORMAL LOW (ref 8.6–10.2)
PTH: 46 pg/mL (ref 15–65)

## 2018-02-01 MED ORDER — SODIUM CHLORIDE 0.9 % IV SOLN
1.0000 g | INTRAVENOUS | Status: AC
  Administered 2018-02-01 – 2018-02-02 (×2): 1 g via INTRAVENOUS
  Filled 2018-02-01 (×2): qty 10

## 2018-02-01 MED ORDER — MIDODRINE HCL 5 MG PO TABS
10.0000 mg | ORAL_TABLET | Freq: Three times a day (TID) | ORAL | Status: DC
  Administered 2018-02-01 – 2018-02-08 (×22): 10 mg via ORAL
  Filled 2018-02-01 (×22): qty 2

## 2018-02-01 MED ORDER — HYDROCORTISONE 2.5 % RE CREA
TOPICAL_CREAM | Freq: Two times a day (BID) | RECTAL | Status: DC
  Administered 2018-02-01 – 2018-02-08 (×12): via RECTAL
  Filled 2018-02-01 (×2): qty 28.35

## 2018-02-01 NOTE — Evaluation (Addendum)
Physical Therapy Evaluation Patient Details Name: Andrew Guerra MRN: [id number] DOB: 1940-03-22 Today's Date: 02/01/2018   History of Present Illness  [name] is a 78yo male who comes to [facility] on 01/09/18 after noted Hb: 6.5 at his PCP's office. Pt has subsequent kidney failure while admitted requiring HD, now with tunneled EJ cath (1/14). Pt also has kidney biopsy on 1/8, cardia catheterization, and a fall on 1/11 (ambien related per RN). Pt now being treated for PNA.   Clinical Impression  Pt admitted with above diagnosis. Pt currently with functional limitations due to the deficits listed below (see "PT Problem List"). Upon entry, pt in chair, wife present. The pt is awake and agreeable to participate. The pt is alert and oriented x3, pleasant, conversational, and following commands consistently. Pt droped from 4L to 2L while seated, maintaining SpO2: 93% or higher. AMB (28ft) on 2L/min results in 84% SpO2, but 93% on 5L/min. Pt requirs moderate BUE assist for rising from chair, but balances well upon standing. AMB with RW in room appears safe and is without any noted gait instability. Functional mobility assessment demonstrates increased effort/time requirements, poor tolerance, but no need for physical assistance, whereas the patient performed these at a higher level of independence PTA. Pt had a high level of function PTA, going to the [employer] 3x weekly for weights and cardio, hence he will need extensive rehab for full recovery, and will benefit from higher frequency available at OPPT. Pt is a good candidate for CIR but is not interested at time of eval and it is not clear that he need work with OT/SLP at this time.  Pt will benefit from skilled PT intervention to increase independence and safety with basic mobility in preparation for discharge to the venue listed below.       Follow Up Recommendations Outpatient PT;Supervision for mobility/OOB    Equipment Recommendations  Rolling walker  with 5" wheels    Recommendations for Other Services       Precautions / Restrictions Precautions Precautions: Fall Restrictions Weight Bearing Restrictions: No      Mobility  Bed Mobility               General bed mobility comments: received up to chair   Transfers Overall transfer level: Needs assistance Equipment used: None Transfers: Sit to/from Stand Sit to Stand: Supervision         General transfer comment: Able to perform 10x when cueded; heavy arm assist on arms of chair  Ambulation/Gait Ambulation/Gait assistance: Supervision Gait Distance (Feet): 50 Feet(2x10ft) Assistive device: Rolling walker (2 wheeled) Gait Pattern/deviations: WFL(Within Functional Limits)     General Gait Details: twice 72ft; on 2L drops for 84%. On 5L drops to 93%.   Stairs            Wheelchair Mobility    Modified Rankin (Stroke Patients Only)       Balance Overall balance assessment: Mild deficits observed, not formally tested                                           Pertinent Vitals/Pain Pain Assessment: No/denies pain    Home Living Family/patient expects to be discharged to:: Private residence Living Arrangements: Spouse/significant other Available Help at Discharge: Family Type of Home: (townhouse) Home Access: Level entry     Home Layout: Two level;Able to live on main level  with bedroom/bathroom Home Equipment: None      Prior Function Level of Independence: Independent         Comments: no prior assistive device use, was going to [facility] 3x weekly for cardio resistance training.      Hand Dominance   Dominant Hand: Left    Extremity/Trunk Assessment   Upper Extremity Assessment Upper Extremity Assessment: Generalized weakness;Overall [facility] for tasks assessed    Lower Extremity Assessment Lower Extremity Assessment: Generalized weakness;Overall WFL for tasks assessed       Communication   Communication: No  difficulties  Cognition Arousal/Alertness: Awake/alert Behavior During Therapy: WFL for tasks assessed/performed Overall Cognitive Status: Within Functional Limits for tasks assessed                                        General Comments      Exercises     Assessment/Plan    PT Assessment Patient needs continued PT services  PT Problem List Decreased strength;Decreased activity tolerance;Decreased balance;Cardiopulmonary status limiting activity       PT Treatment Interventions DME instruction;Functional mobility training;Balance training;Patient/family education;Gait training;Stair training;Therapeutic exercise;Therapeutic activities    PT Goals (Current goals can be found in the Care Plan section)  Acute Rehab PT Goals Patient Stated Goal: regain strength  PT Goal Formulation: With patient Time For Goal Achievement: 02/08/18 Potential to Achieve Goals: Good    Frequency Min 3X/week   Barriers to discharge        Co-evaluation               AM-PAC PT "6 Clicks" Mobility  Outcome Measure Help needed turning from your back to your side while in a flat bed without using bedrails?: None Help needed moving from lying on your back to sitting on the side of a flat bed without using bedrails?: None Help needed moving to and from a bed to a chair (including a wheelchair)?: A Little Help needed standing up from a chair using your arms (e.g., wheelchair or bedside chair)?: None Help needed to walk in hospital room?: A Little Help needed climbing 3-5 steps with a railing? : A Little 6 Click Score: 21    End of Session Equipment Utilized During Treatment: Oxygen Activity Tolerance: Patient tolerated treatment well;Patient limited by fatigue Patient left: in chair;with family/visitor present Nurse Communication: Mobility status PT Visit Diagnosis: Other abnormalities of gait and mobility (R26.89);Muscle weakness (generalized) (M62.81);Difficulty in  walking, not elsewhere classified (R26.2)    Time: 1630-1700 PT Time Calculation (min) (ACUTE ONLY): 30 min   Charges:   PT Evaluation $PT Eval Moderate Complexity: 1 Mod PT Treatments $Therapeutic Activity: 8-22 mins        9:31 PM, 02/01/18 Etta Grandchild, PT, DPT Physical Therapist - [place] [phone] (Pager)  [phone] (Office)      Lavine Hargrove C 02/01/2018, 9:25 PM

## 2018-02-01 NOTE — Progress Notes (Signed)
Patient ID: Andrew Guerra, male   DOB: [birth year], 78 y.o.   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  He was Hemoccult positive in the ED.  He was also noted to be in fluid overload and started on Lasix.  His renal function deteriorated and fluid overload was not responding to Lasix and he was eventually started on hemodialysis on 01/16/2018 as per nephrology.  Course complicated by paroxysmal A. fib.  He became confused and had a fall on 01/28/2018.  Currently being treated for pneumonia +/- UTI.  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  Mental status probably back to baseline.  Ambien held, avoid all sedatives.  MRI of the brain was negative for acute abnormality.  Being treated for pneumonia.  Bilateral pneumonia with acute hypoxic respiratory failure and leukocytosis -Chest x-ray on 01/29/2018 showed bibasilar infiltrates.  Currently on Rocephin and Zithromax since 01/29/2018.  Finish 5-day course of therapy -Currently on 4 L oxygen via nasal cannula.  Wean off oxygen as needed.  Incentive spirometry.  Unclear if the patient has E.  coli UTI -Patient barely makes any urine.  Antibiotic plan as above  Peptic ulcer disease with anemia -EGD on 01/10/2018 showed gastric and duodenal ulcers.  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  On PPI twice daily.  Outpatient follow-up with GI  Acute on chronic anemia -Due to chronic bleeding and iron deficiency -Status post 2 units packed red cells during this admission -Hemoglobin was 7.6 on 01/31/2018.  Monitor hemoglobin.  AKI on chronic kidney disease stage III, currently started on dialysis -Nephrology following.  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  Started dialysis on 01/16/2018 since his volume overload did not respond to Lasix.  Status post tunnel catheter placement by IR on 01/31/2018.  Patient will need outpatient hemodialysis facility arrangement.  Episodes of SVT/history of PAF -CHA2DS2-VASc Score 3 -Cardiology has already evaluated the patient and recommended amiodarone for 100 mg twice a day for 1 week starting 01/22/2018 followed by 200 mg twice a day for 2 weeks and then 200 mg once a day thereafter, to start on 02/11/2018.  Continue Coreg 3.125 mg p.o. twice daily. -Not on anticoagulation because of PUD and bleeding.  Will need to reconsider anticoagulation once ulcers have healed  Question of hemochromatosis -Noted on MRI on 12/09/2017 to have iron deposits and spleen and liver.  Patient is scheduled to see hematology as an outpatient in 03/2018.  Case was discussed with Dr. Learta Codding on 01/30/2018: Hemochromatosis PCR was obtained on 01/30/2018 which can be followed up as an outpatient.  Moderate aortic stenosis due to bicuspid aortic valve -  Stable per cardiology.  Outpatient follow-up  NSCLC of right upper lobe -Treated in 2017 at [facility] with right upper lobe lobectomy: Had right lung recurrence which was treated with radiation -Currently in remission: Follows up every 6 months at [facility]  Moderate malnutrition -Follow  nutrition recommendations   DVT prophylaxis: Heparin subcutaneous Code Status: Full Family Communication: None at bedside Disposition Plan: Home once cleared by nephrology and once outpatient hemodialysis is arranged  Consultants: Cardiology/GI/nephrology/IR  Procedures:   EGD 12/24 LA Class A reflux esophagitis. - Non-bleeding gastric ulcers with no stigmata of bleeding. Biopsied. - Recent diagnosis of intestinal metaplasia and H pylori. Biopsies obtained for mapping and to document response to therapy, respectively. - Multiple non-bleeding duodenal ulcers with no stigmata of bleeding.  TTE 01/13/18 Impressions: EF 60 to 65% without regional wall motion normalities, - Compared to the exam from 10/24/17, the degree of right ventricular dilation has increased, and right ventricular systolic function has decreased. The degree of leftward septal shift and D shaped septum has worsened. There is now a trivial-small pericardial effusion. Side by side comparison of images performed  Antimicrobials:  Rocephin and Zithromax from 01/29/2018 onwards   Subjective: Patient seen and examined at bedside.  He is sitting at the edge of the bed and denies any worsening overnight fever, nausea, vomiting or diarrhea.  Objective: Vitals:   01/31/18 1745 01/31/18 2017 01/31/18 2309 02/01/18 0608  BP: (!) 98/52 (!) 80/45 (!) 89/40 (!) 88/48  Pulse:  66 (!) 45 62  Resp:  20  20  Temp:  (!) 97.4 F (36.3 C)  98.4 F (36.9 C)  TempSrc:  Oral  Oral  SpO2:  97%  97%  Weight:    83.6 kg  Height:        Intake/Output Summary (Last 24 hours) at 02/01/2018 0940 Last data filed at 01/31/2018 2300 Gross per 24 hour  Intake 100.41 ml  Output 2304 ml  Net -2203.59 ml   Filed Weights   01/31/18 0735 01/31/18 1158 02/01/18 0608  Weight: 85.2 kg 82.9 kg 83.6 kg    Examination:  General exam: Appears calm and comfortable  Respiratory system: Bilateral decreased breath sounds at  bases, with some scattered crackles Cardiovascular system: S1 & S2 heard, Rate controlled Gastrointestinal system: Abdomen is nondistended, soft and nontender. Normal bowel sounds heard. Extremities: No cyanosis, clubbing; trace edema   Data Reviewed: I have personally reviewed following labs and imaging studies  CBC: Recent Labs  Lab 01/26/18 1846 01/27/18 1311 01/28/18 1314 01/29/18 0739 01/29/18 2014 01/30/18 0359 01/31/18 0747  WBC 30.8* 23.1* 15.6*  --   --  14.0* 12.3*  HGB 8.5* 7.5* 7.8* 7.3* 8.5* 8.3* 7.6*  HCT 26.9* 23.7* 24.5* 23.6* 27.7* 26.8* 25.0*  MCV 73.3* 73.6* 73.8*  --   --  73.8* 74.0*  PLT 422* 394 410*  --   --  344 466   Basic Metabolic Panel: Recent Labs  Lab 01/28/18 0345 01/29/18 0349 01/30/18 0359 01/31/18 0203 01/31/18 0747 02/01/18 0348  NA 136 136 136 136  --  138  K 3.5 3.7 3.2* 3.0*  --  3.4*  CL 99 98 99 100  --  105  CO2 25 26 27 26   --  25  GLUCOSE 125* 117* 109* 102*  --  99  BUN 52* 29* 47* 59*  --  30*  CREATININE 4.26* 2.96* 4.44* 5.23*  --  3.55*  CALCIUM 8.1* 8.0* 7.9* 8.0* 8.0* 7.7*  PHOS 3.6 3.0 3.3  3.9  --  2.6   GFR: Estimated Creatinine Clearance: 17.7 mL/min (A) (by C-G formula based on SCr of 3.55 mg/dL (H)). Liver Function Tests: Recent Labs  Lab 01/28/18 0345 01/29/18 0349 01/30/18 0359 01/31/18 0203 02/01/18 0348  ALBUMIN 1.8* 1.8* 1.8* 1.6* 1.6*   No results for input(s): LIPASE, AMYLASE in the last 168 hours. No results for input(s): AMMONIA in the last 168 hours. Coagulation Profile: No results for input(s): INR, PROTIME in the last 168 hours. Cardiac Enzymes: No results for input(s): CKTOTAL, CKMB, CKMBINDEX, TROPONINI in the last 168 hours. BNP (last 3 results) No results for input(s): PROBNP in the last 8760 hours. HbA1C: No results for input(s): HGBA1C in the last 72 hours. CBG: No results for input(s): GLUCAP in the last 168 hours. Lipid Profile: No results for input(s): CHOL, HDL, LDLCALC,  TRIG, CHOLHDL, LDLDIRECT in the last 72 hours. Thyroid Function Tests: No results for input(s): TSH, T4TOTAL, FREET4, T3FREE, THYROIDAB in the last 72 hours. Anemia Panel: Recent Labs    01/31/18 0747  TIBC 206*  IRON 10*   Sepsis Labs: No results for input(s): PROCALCITON, LATICACIDVEN in the last 168 hours.  Recent Results (from the past 240 hour(s))  Culture, Urine     Status: Abnormal   Collection Time: 01/30/18  7:59 AM  Result Value Ref Range Status   Specimen Description URINE, RANDOM  Final   Special Requests   Final    NONE Performed at [facility], [address] [place], [place] [address]    Culture (A)  Final    >=100,000 COLONIES/mL ESCHERICHIA COLI Confirmed Extended Spectrum Beta-Lactamase Producer (ESBL).  In bloodstream infections from ESBL organisms, carbapenems are preferred over piperacillin/tazobactam. They are shown to have a lower risk of mortality.    Report Status 02/01/2018 FINAL  Final   Organism ID, Bacteria ESCHERICHIA COLI (A)  Final      Susceptibility   Escherichia coli - MIC*    AMPICILLIN >=32 RESISTANT Resistant     CEFAZOLIN >=64 RESISTANT Resistant     CEFTRIAXONE RESISTANT Resistant     CIPROFLOXACIN >=4 RESISTANT Resistant     GENTAMICIN <=1 SENSITIVE Sensitive     IMIPENEM <=0.25 SENSITIVE Sensitive     NITROFURANTOIN <=16 SENSITIVE Sensitive     TRIMETH/SULFA >=320 RESISTANT Resistant     AMPICILLIN/SULBACTAM >=32 RESISTANT Resistant     PIP/TAZO 8 SENSITIVE Sensitive     Extended ESBL POSITIVE Resistant     * >=100,000 COLONIES/mL ESCHERICHIA COLI         Radiology Studies: Ir Fluoro Guide Cv Line Right  Result Date: 01/31/2018 CLINICAL DATA:  End-stage renal disease, needs durable venous access for hemodialysis. A temporary right IJ hemodialysis catheter was placed 01/16/2018. EXAM: TUNNELED HEMODIALYSIS CATHETER PLACEMENT WITH ULTRASOUND AND FLUOROSCOPIC GUIDANCE TECHNIQUE: The procedure, risks, benefits, and  alternatives were explained to the patient. Questions regarding the procedure were encouraged and answered. The patient understands and consents to the procedure. As antibiotic prophylaxis, cefazolin 2 g was ordered pre-procedure and administered intravenously within one hour of incision. Patency of a good-sized right external jugular vein was confirmed with ultrasound with image documentation. An appropriate skin site was determined. Region was prepped using maximum barrier technique including cap and mask, sterile gown, sterile gloves, large sterile sheet, and Chlorhexidine as cutaneous antisepsis. The region was infiltrated locally with 1% lidocaine. Intravenous Fentanyl and Versed were administered as conscious sedation during continuous monitoring of the patient's level of consciousness and physiological /  cardiorespiratory status by the radiology RN, with a total moderate sedation time of 17 minutes. Under real-time ultrasound guidance, the right EJ vein was accessed with a 21 gauge micropuncture needle; the needle tip within the vein was confirmed with ultrasound image documentation. Needle exchanged over the 018 guidewire for transitional dilator, which allowed advancement of a Benson wire into the IVC. Over this, an MPA catheter was advanced. A Palindrome 23 hemodialysis catheter was tunneled from the right anterior chest wall approach to the right EJ dermatotomy site. The MPA catheter was exchanged over an Amplatz wire for serial vascular dilators which allow placement of a peel-away sheath, through which the catheter was advanced under intermittent fluoroscopy, positioned with its tips in the proximal and midright atrium. Spot chest radiograph confirms good catheter position. No pneumothorax. Catheter was flushed and primed per protocol. Catheter secured externally with O Prolene sutures. The right EJ dermatotomy site was closed with Dermabond. COMPLICATIONS: COMPLICATIONS None immediate FLUOROSCOPY TIME:   48 seconds; 2 mGy COMPARISON:  None IMPRESSION: 1. Technically successful placement of tunneled right EJ hemodialysis catheter with ultrasound and fluoroscopic guidance. Ready for routine use. ACCESS: Remains approachable for percutaneous intervention as needed. Electronically Signed   By: Lucrezia Europe M.D.   On: 01/31/2018 16:54   Ir US Guide Vasc Access Right  Result Date: 01/31/2018 CLINICAL DATA:  End-stage renal disease, needs durable venous access for hemodialysis. A temporary right IJ hemodialysis catheter was placed 01/16/2018. EXAM: TUNNELED HEMODIALYSIS CATHETER PLACEMENT WITH ULTRASOUND AND FLUOROSCOPIC GUIDANCE TECHNIQUE: The procedure, risks, benefits, and alternatives were explained to the patient. Questions regarding the procedure were encouraged and answered. The patient understands and consents to the procedure. As antibiotic prophylaxis, cefazolin 2 g was ordered pre-procedure and administered intravenously within one hour of incision. Patency of a good-sized right external jugular vein was confirmed with ultrasound with image documentation. An appropriate skin site was determined. Region was prepped using maximum barrier technique including cap and mask, sterile gown, sterile gloves, large sterile sheet, and Chlorhexidine as cutaneous antisepsis. The region was infiltrated locally with 1% lidocaine. Intravenous Fentanyl and Versed were administered as conscious sedation during continuous monitoring of the patient's level of consciousness and physiological / cardiorespiratory status by the radiology RN, with a total moderate sedation time of 17 minutes. Under real-time ultrasound guidance, the right EJ vein was accessed with a 21 gauge micropuncture needle; the needle tip within the vein was confirmed with ultrasound image documentation. Needle exchanged over the 018 guidewire for transitional dilator, which allowed advancement of a Benson wire into the IVC. Over this, an MPA catheter was  advanced. A Palindrome 23 hemodialysis catheter was tunneled from the right anterior chest wall approach to the right EJ dermatotomy site. The MPA catheter was exchanged over an Amplatz wire for serial vascular dilators which allow placement of a peel-away sheath, through which the catheter was advanced under intermittent fluoroscopy, positioned with its tips in the proximal and midright atrium. Spot chest radiograph confirms good catheter position. No pneumothorax. Catheter was flushed and primed per protocol. Catheter secured externally with O Prolene sutures. The right EJ dermatotomy site was closed with Dermabond. COMPLICATIONS: COMPLICATIONS None immediate FLUOROSCOPY TIME:  48 seconds; 2 mGy COMPARISON:  None IMPRESSION: 1. Technically successful placement of tunneled right EJ hemodialysis catheter with ultrasound and fluoroscopic guidance. Ready for routine use. ACCESS: Remains approachable for percutaneous intervention as needed. Electronically Signed   By: Lucrezia Europe M.D.   On: 01/31/2018 16:54  Scheduled Meds: . sodium chloride   Intravenous Once  . amiodarone  200 mg Oral BID  . azithromycin  250 mg Oral Daily  . Chlorhexidine Gluconate Cloth  6 each Topical Q0600  . feeding supplement (NEPRO CARB STEADY)  237 mL Oral TID BM  . heparin  5,000 Units Subcutaneous Q8H  . mouth rinse  15 mL Mouth Rinse BID  . mometasone-formoterol  2 puff Inhalation BID  . multivitamin  1 tablet Oral QHS  . Netarsudil-Latanoprost  1 drop Both Eyes QHS  . pantoprazole  40 mg Oral BID  . sodium chloride flush  3 mL Intravenous Q12H   Continuous Infusions: . sodium chloride    .  ceFAZolin (ANCEF) IV    . cefTRIAXone (ROCEPHIN)  IV Stopped (01/31/18 1411)     LOS: 23 days        Aline August, MD [facility] Pager [phone]  If 7PM-7AM, please contact night-coverage [email or web address] Password [facility] 02/01/2018, 9:40 AM

## 2018-02-01 NOTE — Progress Notes (Signed)
Subjective: Interval History: has no complaint made some urine today.  Objective: Vital signs in last 24 hours: Temp:  [97.4 F (36.3 C)-98.5 F (36.9 C)] 98.4 F (36.9 C) (01/15 0608) Pulse Rate:  [45-69] 62 (01/15 0608) Resp:  [18-27] 20 (01/15 0608) BP: (80-112)/(40-59) 88/48 (01/15 0608) SpO2:  [93 %-100 %] 97 % (01/15 2130) Weight:  [82.9 kg-83.6 kg] 83.6 kg (01/15 0608) Weight change: -0.1 kg  Intake/Output from previous day: 01/14 0701 - 01/15 0700 In: 100.4 [IV Piggyback:100.4] Out: 2304  Intake/Output this shift: No intake/output data recorded.  General appearance: cooperative, no distress and slowed mentation Resp: diminished breath sounds bilaterally and rales bibasilar Chest wall: RIJ PC Cardio: S1, S2 normal and systolic murmur: systolic ejection 2/6, crescendo and decrescendo at 2nd left intercostal space GI: soft, non-tender; bowel sounds normal; no masses,  no organomegaly Extremities: edema 3+  Lab Results: Recent Labs    01/30/18 0359 01/31/18 0747  WBC 14.0* 12.3*  HGB 8.3* 7.6*  HCT 26.8* 25.0*  PLT 344 316   BMET:  Recent Labs    01/31/18 0203 01/31/18 0747 02/01/18 0348  NA 136  --  138  K 3.0*  --  3.4*  CL 100  --  105  CO2 26  --  25  GLUCOSE 102*  --  99  BUN 59*  --  30*  CREATININE 5.23*  --  3.55*  CALCIUM 8.0* 8.0* 7.7*   Recent Labs    01/31/18 0747  PTH 46  Comment   Iron Studies:  Recent Labs    01/31/18 0747  IRON 10*  TIBC 206*    Studies/Results: Ir Fluoro Guide Cv Line Right  Result Date: 01/31/2018 CLINICAL DATA:  End-stage renal disease, needs durable venous access for hemodialysis. A temporary right IJ hemodialysis catheter was placed 01/16/2018. EXAM: TUNNELED HEMODIALYSIS CATHETER PLACEMENT WITH ULTRASOUND AND FLUOROSCOPIC GUIDANCE TECHNIQUE: The procedure, risks, benefits, and alternatives were explained to the patient. Questions regarding the procedure were encouraged and answered. The patient  understands and consents to the procedure. As antibiotic prophylaxis, cefazolin 2 g was ordered pre-procedure and administered intravenously within one hour of incision. Patency of a good-sized right external jugular vein was confirmed with ultrasound with image documentation. An appropriate skin site was determined. Region was prepped using maximum barrier technique including cap and mask, sterile gown, sterile gloves, large sterile sheet, and Chlorhexidine as cutaneous antisepsis. The region was infiltrated locally with 1% lidocaine. Intravenous Fentanyl and Versed were administered as conscious sedation during continuous monitoring of the patient's level of consciousness and physiological / cardiorespiratory status by the radiology RN, with a total moderate sedation time of 17 minutes. Under real-time ultrasound guidance, the right EJ vein was accessed with a 21 gauge micropuncture needle; the needle tip within the vein was confirmed with ultrasound image documentation. Needle exchanged over the 018 guidewire for transitional dilator, which allowed advancement of a Benson wire into the IVC. Over this, an MPA catheter was advanced. A Palindrome 23 hemodialysis catheter was tunneled from the right anterior chest wall approach to the right EJ dermatotomy site. The MPA catheter was exchanged over an Amplatz wire for serial vascular dilators which allow placement of a peel-away sheath, through which the catheter was advanced under intermittent fluoroscopy, positioned with its tips in the proximal and midright atrium. Spot chest radiograph confirms good catheter position. No pneumothorax. Catheter was flushed and primed per protocol. Catheter secured externally with O Prolene sutures. The right EJ dermatotomy site was  closed with Dermabond. COMPLICATIONS: COMPLICATIONS None immediate FLUOROSCOPY TIME:  48 seconds; 2 mGy COMPARISON:  None IMPRESSION: 1. Technically successful placement of tunneled right EJ hemodialysis  catheter with ultrasound and fluoroscopic guidance. Ready for routine use. ACCESS: Remains approachable for percutaneous intervention as needed. Electronically Signed   By: Lucrezia Europe M.D.   On: 01/31/2018 16:54   Ir US Guide Vasc Access Right  Result Date: 01/31/2018 CLINICAL DATA:  End-stage renal disease, needs durable venous access for hemodialysis. A temporary right IJ hemodialysis catheter was placed 01/16/2018. EXAM: TUNNELED HEMODIALYSIS CATHETER PLACEMENT WITH ULTRASOUND AND FLUOROSCOPIC GUIDANCE TECHNIQUE: The procedure, risks, benefits, and alternatives were explained to the patient. Questions regarding the procedure were encouraged and answered. The patient understands and consents to the procedure. As antibiotic prophylaxis, cefazolin 2 g was ordered pre-procedure and administered intravenously within one hour of incision. Patency of a good-sized right external jugular vein was confirmed with ultrasound with image documentation. An appropriate skin site was determined. Region was prepped using maximum barrier technique including cap and mask, sterile gown, sterile gloves, large sterile sheet, and Chlorhexidine as cutaneous antisepsis. The region was infiltrated locally with 1% lidocaine. Intravenous Fentanyl and Versed were administered as conscious sedation during continuous monitoring of the patient's level of consciousness and physiological / cardiorespiratory status by the radiology RN, with a total moderate sedation time of 17 minutes. Under real-time ultrasound guidance, the right EJ vein was accessed with a 21 gauge micropuncture needle; the needle tip within the vein was confirmed with ultrasound image documentation. Needle exchanged over the 018 guidewire for transitional dilator, which allowed advancement of a Benson wire into the IVC. Over this, an MPA catheter was advanced. A Palindrome 23 hemodialysis catheter was tunneled from the right anterior chest wall approach to the right EJ  dermatotomy site. The MPA catheter was exchanged over an Amplatz wire for serial vascular dilators which allow placement of a peel-away sheath, through which the catheter was advanced under intermittent fluoroscopy, positioned with its tips in the proximal and midright atrium. Spot chest radiograph confirms good catheter position. No pneumothorax. Catheter was flushed and primed per protocol. Catheter secured externally with O Prolene sutures. The right EJ dermatotomy site was closed with Dermabond. COMPLICATIONS: COMPLICATIONS None immediate FLUOROSCOPY TIME:  48 seconds; 2 mGy COMPARISON:  None IMPRESSION: 1. Technically successful placement of tunneled right EJ hemodialysis catheter with ultrasound and fluoroscopic guidance. Ready for routine use. ACCESS: Remains approachable for percutaneous intervention as needed. Electronically Signed   By: Lucrezia Europe M.D.   On: 01/31/2018 16:54    I have reviewed the patient's current medications.  Assessment/Plan: 1 AKI suspect ATN.  Vol xs but low bps limit .  ? Some urine now.   Not good candidate for outpatient therapy 2 Anemia give Fe , add esa 3 Afib in NSR  4 GIB stable 5 NSCLCa 6 ? Dementia P follow chem , HD as needed,     LOS: 23 days   Andrew Guerra 02/01/2018,10:47 AM

## 2018-02-02 ENCOUNTER — Encounter: Payer: Self-pay | Admitting: Family Medicine

## 2018-02-02 LAB — RENAL FUNCTION PANEL
Albumin: 1.7 g/dL — ABNORMAL LOW (ref 3.5–5.0)
Anion gap: 10 (ref 5–15)
BUN: 44 mg/dL — ABNORMAL HIGH (ref 8–23)
CALCIUM: 8.1 mg/dL — AB (ref 8.9–10.3)
CO2: 27 mmol/L (ref 22–32)
Chloride: 102 mmol/L (ref 98–111)
Creatinine, Ser: 4.55 mg/dL — ABNORMAL HIGH (ref 0.61–1.24)
GFR calc Af Amer: 13 mL/min — ABNORMAL LOW (ref >60)
GFR calc non Af Amer: 11 mL/min — ABNORMAL LOW (ref >60)
Glucose, Bld: 85 mg/dL (ref 70–99)
Phosphorus: 3.2 mg/dL (ref 2.5–4.6)
Potassium: 3.4 mmol/L — ABNORMAL LOW (ref 3.5–5.1)
SODIUM: 139 mmol/L (ref 135–145)

## 2018-02-02 MED ORDER — FUROSEMIDE 80 MG PO TABS
160.0000 mg | ORAL_TABLET | Freq: Two times a day (BID) | ORAL | Status: DC
  Administered 2018-02-02 – 2018-02-06 (×8): 160 mg via ORAL
  Filled 2018-02-02 (×9): qty 2

## 2018-02-02 NOTE — Progress Notes (Signed)
Patient ID: Andrew Guerra, male   DOB: June 24, 1940, 78 y.o.   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  He was Hemoccult positive in the ED.  He was also noted to be in fluid overload and started on Lasix.  His renal function deteriorated and fluid overload was not responding to Lasix and he was eventually started on hemodialysis on 01/16/2018 as per nephrology.  Course complicated by paroxysmal A. fib.  He became confused and had a fall on 01/28/2018.  Currently being treated for pneumonia +/- UTI.  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  Mental status probably back to baseline.  Ambien held, avoid all sedatives.  MRI of the brain was negative for acute abnormality.  Being treated for pneumonia.  Bilateral pneumonia with acute hypoxic respiratory failure and leukocytosis -Chest x-ray on 01/29/2018 showed bibasilar infiltrates.  Currently on Rocephin and Zithromax since 01/29/2018.  DC antibiotics after today's doses -Currently still on 4 L oxygen via nasal cannula.  Wean off oxygen as needed.  Incentive spirometry. - Repeat CXR in  AM  Unclear if the patient has  UTI -Patient barely makes any urine. Urine Culture grew ESBL E Coli. Symptoms have improved with above antibiotic regimen. E coli is probably a colonizer. Will not change antibiotic regimen.  Antibiotic plan as above  Peptic ulcer disease with anemia -EGD on 01/10/2018 showed gastric and duodenal ulcers.  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  On PPI twice daily.  Outpatient follow-up with GI  Acute on chronic anemia -Due to chronic bleeding and iron deficiency -Status post 2 units packed red cells during this admission -Hemoglobin was 7.6 on 01/31/2018.  Monitor hemoglobin.  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  Started dialysis on 01/16/2018 since his volume overload did not respond to Lasix.  Status post tunnel catheter placement by IR on 01/31/2018.  Follow further nephrology recommendations.  Episodes of SVT/history of PAF -CHA2DS2-VASc Score 3 -Cardiology has already evaluated the patient and recommended amiodarone for 100 mg twice a day for 1 week starting 01/22/2018 followed by 200 mg twice a day for 2 weeks and then 200 mg once a day thereafter, to start on 02/11/2018.  Continue Coreg 3.125 mg p.o. twice daily. -Not on anticoagulation because of PUD and bleeding.  Will need to reconsider anticoagulation once ulcers have healed  Question of hemochromatosis -Noted on MRI on 12/09/2017 to have iron deposits and spleen and liver.  Patient is scheduled to see hematology as an outpatient in 03/2018.  Case was discussed with  Dr. Learta Codding on 01/30/2018: Hemochromatosis PCR was obtained on 01/30/2018 which can be followed up as an outpatient.  Moderate aortic stenosis due to bicuspid aortic valve -Stable per cardiology.  Outpatient follow-up  NSCLC of right upper lobe -Treated in 2017 at [facility] with right  upper lobe lobectomy: Had right lung recurrence which was treated with radiation -Currently in remission: Follows up every 6 months at [facility]  Moderate malnutrition -Follow nutrition recommendations   DVT prophylaxis: Heparin subcutaneous Code Status: Full Family Communication: None at bedside Disposition Plan: Home once cleared by nephrology   Consultants: Cardiology/GI/nephrology/IR  Procedures:   EGD 12/24 LA Class A reflux esophagitis. - Non-bleeding gastric ulcers with no stigmata of bleeding. Biopsied. - Recent diagnosis of intestinal metaplasia and H pylori. Biopsies obtained for mapping and to document response to therapy, respectively. - Multiple non-bleeding duodenal ulcers with no stigmata of bleeding.  TTE 01/13/18 Impressions: EF 60 to 65% without regional wall motion normalities, - Compared to the exam from 10/24/17, the degree of right ventricular dilation has increased, and right ventricular systolic function has decreased. The degree of leftward septal shift and D shaped septum has worsened. There is now a trivial-small pericardial effusion. Side by side comparison of images performed  Antimicrobials:  Rocephin and Zithromax from 01/29/2018 onwards   Subjective: Patient seen and examined at bedside. Patient denies worsening shortness of breath, chest pain or overnight fever/vomiting. Objective: Vitals:   02/01/18 2100 02/02/18 0500 02/02/18 0557 02/02/18 0844  BP: 138/63  120/62   Pulse: 61  64   Resp: 17  18   Temp: 98 F (36.7 C)  97.8 F (36.6 C)   TempSrc: Oral     SpO2: 100%  94% 95%  Weight:  83 kg    Height:        Intake/Output Summary (Last 24 hours) at 02/02/2018 0926 Last data filed at 02/02/2018 0631 Gross per 24 hour  Intake 160 ml  Output 30 ml  Net 130 ml   Filed Weights   01/31/18 1158 02/01/18 0608 02/02/18 0500  Weight: 82.9 kg 83.6 kg 83 kg    Examination:  General exam: Appears calm and comfortable. No  distress Respiratory system: Bilateral decreased breath sounds at bases, with some scattered crackles. No wheezing. Cardiovascular system: S1 & S2 heard, Rate controlled Gastrointestinal system: Abdomen is nondistended, soft and nontender. Normal bowel sounds heard. Extremities: No cyanosis; trace edema   Data Reviewed: I have personally reviewed following labs and imaging studies  CBC: Recent Labs  Lab 01/26/18 1846 01/27/18 1311 01/28/18 1314 01/29/18 0739 01/29/18 2014 01/30/18 0359 01/31/18 0747  WBC 30.8* 23.1* 15.6*  --   --  14.0* 12.3*  HGB 8.5* 7.5* 7.8* 7.3* 8.5* 8.3* 7.6*  HCT 26.9* 23.7* 24.5* 23.6* 27.7* 26.8* 25.0*  MCV 73.3* 73.6* 73.8*  --   --  73.8* 74.0*  PLT 422* 394 410*  --   --  344 412   Basic Metabolic Panel: Recent Labs  Lab 01/29/18 0349 01/30/18 0359 01/31/18 0203 01/31/18 0747 02/01/18 0348 02/02/18 0646  NA 136 136 136  --  138 139  K 3.7 3.2* 3.0*  --  3.4* 3.4*  CL 98 99 100  --  105 102  CO2 26 27 26   --  25 27  GLUCOSE 117* 109* 102*  --  99 85  BUN 29* 47* 59*  --  30* 44*  CREATININE 2.96* 4.44* 5.23*  --  3.55* 4.55*  CALCIUM 8.0* 7.9* 8.0*  8.0* 7.7* 8.1*  PHOS 3.0 3.3 3.9  --  2.6 3.2   GFR: Estimated Creatinine Clearance: 13.8 mL/min (A) (by C-G formula based on SCr of 4.55 mg/dL (H)). Liver Function Tests: Recent Labs  Lab 01/29/18 0349 01/30/18 0359 01/31/18 0203 02/01/18 0348 02/02/18 0646  ALBUMIN 1.8* 1.8* 1.6* 1.6* 1.7*   No results for input(s): LIPASE, AMYLASE in the last 168 hours. No results for input(s): AMMONIA in the last 168 hours. Coagulation Profile: No results for input(s): INR, PROTIME in the last 168 hours. Cardiac Enzymes: No results for input(s): CKTOTAL, CKMB, CKMBINDEX, TROPONINI in the last 168 hours. BNP (last 3 results) No results for input(s): PROBNP in the last 8760 hours. HbA1C: No results for input(s): HGBA1C in the last 72 hours. CBG: No results for input(s): GLUCAP in the last 168  hours. Lipid Profile: No results for input(s): CHOL, HDL, LDLCALC, TRIG, CHOLHDL, LDLDIRECT in the last 72 hours. Thyroid Function Tests: No results for input(s): TSH, T4TOTAL, FREET4, T3FREE, THYROIDAB in the last 72 hours. Anemia Panel: Recent Labs    01/31/18 0747  TIBC 206*  IRON 10*   Sepsis Labs: No results for input(s): PROCALCITON, LATICACIDVEN in the last 168 hours.  Recent Results (from the past 240 hour(s))  Culture, Urine     Status: Abnormal   Collection Time: 01/30/18  7:59 AM  Result Value Ref Range Status   Specimen Description URINE, RANDOM  Final   Special Requests   Final    NONE Performed at [facility], [address], [place], [place] [address]    Culture (A)  Final    >=100,000 COLONIES/mL ESCHERICHIA COLI Confirmed Extended Spectrum Beta-Lactamase Producer (ESBL).  In bloodstream infections from ESBL organisms, carbapenems are preferred over piperacillin/tazobactam. They are shown to have a lower risk of mortality.    Report Status 02/01/2018 FINAL  Final   Organism ID, Bacteria ESCHERICHIA COLI (A)  Final      Susceptibility   Escherichia coli - MIC*    AMPICILLIN >=32 RESISTANT Resistant     CEFAZOLIN >=64 RESISTANT Resistant     CEFTRIAXONE RESISTANT Resistant     CIPROFLOXACIN >=4 RESISTANT Resistant     GENTAMICIN <=1 SENSITIVE Sensitive     IMIPENEM <=0.25 SENSITIVE Sensitive     NITROFURANTOIN <=16 SENSITIVE Sensitive     TRIMETH/SULFA >=320 RESISTANT Resistant     AMPICILLIN/SULBACTAM >=32 RESISTANT Resistant     PIP/TAZO 8 SENSITIVE Sensitive     Extended ESBL POSITIVE Resistant     * >=100,000 COLONIES/mL ESCHERICHIA COLI         Radiology Studies: Ir Fluoro Guide Cv Line Right  Result Date: 01/31/2018 CLINICAL DATA:  End-stage renal disease, needs durable venous access for hemodialysis. A temporary right IJ hemodialysis catheter was placed 01/16/2018. EXAM: TUNNELED HEMODIALYSIS CATHETER PLACEMENT WITH ULTRASOUND AND  FLUOROSCOPIC GUIDANCE TECHNIQUE: The procedure, risks, benefits, and alternatives were explained to the patient. Questions regarding the procedure were encouraged and answered. The patient understands and consents to the procedure. As antibiotic prophylaxis, cefazolin 2 g was ordered pre-procedure and administered intravenously within one hour of incision. Patency of a good-sized right external jugular vein was confirmed with ultrasound with image documentation. An appropriate skin site was determined. Region was prepped using maximum barrier technique including cap and mask, sterile gown, sterile gloves, large sterile sheet, and Chlorhexidine as cutaneous antisepsis. The region was infiltrated locally with 1% lidocaine. Intravenous Fentanyl and Versed were administered as conscious sedation during continuous monitoring  of the patient's level of consciousness and physiological / cardiorespiratory status by the radiology RN, with a total moderate sedation time of 17 minutes. Under real-time ultrasound guidance, the right EJ vein was accessed with a 21 gauge micropuncture needle; the needle tip within the vein was confirmed with ultrasound image documentation. Needle exchanged over the 018 guidewire for transitional dilator, which allowed advancement of a Benson wire into the IVC. Over this, an MPA catheter was advanced. A Palindrome 23 hemodialysis catheter was tunneled from the right anterior chest wall approach to the right EJ dermatotomy site. The MPA catheter was exchanged over an Amplatz wire for serial vascular dilators which allow placement of a peel-away sheath, through which the catheter was advanced under intermittent fluoroscopy, positioned with its tips in the proximal and midright atrium. Spot chest radiograph confirms good catheter position. No pneumothorax. Catheter was flushed and primed per protocol. Catheter secured externally with O Prolene sutures. The right EJ dermatotomy site was closed with  Dermabond. COMPLICATIONS: COMPLICATIONS None immediate FLUOROSCOPY TIME:  48 seconds; 2 mGy COMPARISON:  None IMPRESSION: 1. Technically successful placement of tunneled right EJ hemodialysis catheter with ultrasound and fluoroscopic guidance. Ready for routine use. ACCESS: Remains approachable for percutaneous intervention as needed. Electronically Signed   By: Lucrezia Europe M.D.   On: 01/31/2018 16:54   Ir US Guide Vasc Access Right  Result Date: 01/31/2018 CLINICAL DATA:  End-stage renal disease, needs durable venous access for hemodialysis. A temporary right IJ hemodialysis catheter was placed 01/16/2018. EXAM: TUNNELED HEMODIALYSIS CATHETER PLACEMENT WITH ULTRASOUND AND FLUOROSCOPIC GUIDANCE TECHNIQUE: The procedure, risks, benefits, and alternatives were explained to the patient. Questions regarding the procedure were encouraged and answered. The patient understands and consents to the procedure. As antibiotic prophylaxis, cefazolin 2 g was ordered pre-procedure and administered intravenously within one hour of incision. Patency of a good-sized right external jugular vein was confirmed with ultrasound with image documentation. An appropriate skin site was determined. Region was prepped using maximum barrier technique including cap and mask, sterile gown, sterile gloves, large sterile sheet, and Chlorhexidine as cutaneous antisepsis. The region was infiltrated locally with 1% lidocaine. Intravenous Fentanyl and Versed were administered as conscious sedation during continuous monitoring of the patient's level of consciousness and physiological / cardiorespiratory status by the radiology RN, with a total moderate sedation time of 17 minutes. Under real-time ultrasound guidance, the right EJ vein was accessed with a 21 gauge micropuncture needle; the needle tip within the vein was confirmed with ultrasound image documentation. Needle exchanged over the 018 guidewire for transitional dilator, which allowed  advancement of a Benson wire into the IVC. Over this, an MPA catheter was advanced. A Palindrome 23 hemodialysis catheter was tunneled from the right anterior chest wall approach to the right EJ dermatotomy site. The MPA catheter was exchanged over an Amplatz wire for serial vascular dilators which allow placement of a peel-away sheath, through which the catheter was advanced under intermittent fluoroscopy, positioned with its tips in the proximal and midright atrium. Spot chest radiograph confirms good catheter position. No pneumothorax. Catheter was flushed and primed per protocol. Catheter secured externally with O Prolene sutures. The right EJ dermatotomy site was closed with Dermabond. COMPLICATIONS: COMPLICATIONS None immediate FLUOROSCOPY TIME:  48 seconds; 2 mGy COMPARISON:  None IMPRESSION: 1. Technically successful placement of tunneled right EJ hemodialysis catheter with ultrasound and fluoroscopic guidance. Ready for routine use. ACCESS: Remains approachable for percutaneous intervention as needed. Electronically Signed   By: Eden Emms.D.  On: 01/31/2018 16:54        Scheduled Meds: . sodium chloride   Intravenous Once  . amiodarone  200 mg Oral BID  . azithromycin  250 mg Oral Daily  . Chlorhexidine Gluconate Cloth  6 each Topical Q0600  . feeding supplement (NEPRO CARB STEADY)  237 mL Oral TID BM  . heparin  5,000 Units Subcutaneous Q8H  . hydrocortisone   Rectal BID  . mouth rinse  15 mL Mouth Rinse BID  . midodrine  10 mg Oral TID WC  . mometasone-formoterol  2 puff Inhalation BID  . multivitamin  1 tablet Oral QHS  . Netarsudil-Latanoprost  1 drop Both Eyes QHS  . pantoprazole  40 mg Oral BID  . sodium chloride flush  3 mL Intravenous Q12H   Continuous Infusions: . sodium chloride 10 mL/hr at 02/02/18 0100  . cefTRIAXone (ROCEPHIN)  IV Stopped (02/01/18 1449)     LOS: 24 days        Aline August, MD [facility] Pager [phone]  If 7PM-7AM, please  contact night-coverage [email or web address] Password [facility] 02/02/2018, 9:26 AM

## 2018-02-02 NOTE — Progress Notes (Signed)
Subjective: Interval History: has complaints cont feels as if has to void, leaking in bed.  Objective: Vital signs in last 24 hours: Temp:  [97.8 F (36.6 C)-98.8 F (37.1 C)] 97.8 F (36.6 C) (01/16 0557) Pulse Rate:  [61-73] 64 (01/16 0557) Resp:  [17-18] 18 (01/16 0557) BP: (101-138)/(49-63) 120/62 (01/16 0557) SpO2:  [93 %-100 %] 95 % (01/16 0844) Weight:  [83 kg] 83 kg (01/16 0500) Weight change: -2.2 kg  Intake/Output from previous day: 01/15 0701 - 01/16 0700 In: 160 [I.V.:60; IV Piggyback:100] Out: 30 [Urine:30] Intake/Output this shift: No intake/output data recorded.  General appearance: alert, cooperative and no distress Resp: diminished breath sounds bilaterally Chest wall: RIJ PC Cardio: S1, S2 normal and systolic murmur: systolic ejection 2/6, crescendo and decrescendo at 2nd left intercostal space GI: soft, non-tender; bowel sounds normal; no masses,  no organomegaly Extremities: edema 2-3+  Lab Results: Recent Labs    01/31/18 0747  WBC 12.3*  HGB 7.6*  HCT 25.0*  PLT 316   BMET:  Recent Labs    02/01/18 0348 02/02/18 0646  NA 138 139  K 3.4* 3.4*  CL 105 102  CO2 25 27  GLUCOSE 99 85  BUN 30* 44*  CREATININE 3.55* 4.55*  CALCIUM 7.7* 8.1*   Recent Labs    01/31/18 0747  PTH 46  Comment   Iron Studies:  Recent Labs    01/31/18 0747  IRON 10*  TIBC 206*    Studies/Results: Ir Fluoro Guide Cv Line Right  Result Date: 01/31/2018 CLINICAL DATA:  End-stage renal disease, needs durable venous access for hemodialysis. A temporary right IJ hemodialysis catheter was placed 01/16/2018. EXAM: TUNNELED HEMODIALYSIS CATHETER PLACEMENT WITH ULTRASOUND AND FLUOROSCOPIC GUIDANCE TECHNIQUE: The procedure, risks, benefits, and alternatives were explained to the patient. Questions regarding the procedure were encouraged and answered. The patient understands and consents to the procedure. As antibiotic prophylaxis, cefazolin 2 g was ordered  pre-procedure and administered intravenously within one hour of incision. Patency of a good-sized right external jugular vein was confirmed with ultrasound with image documentation. An appropriate skin site was determined. Region was prepped using maximum barrier technique including cap and mask, sterile gown, sterile gloves, large sterile sheet, and Chlorhexidine as cutaneous antisepsis. The region was infiltrated locally with 1% lidocaine. Intravenous Fentanyl and Versed were administered as conscious sedation during continuous monitoring of the patient's level of consciousness and physiological / cardiorespiratory status by the radiology RN, with a total moderate sedation time of 17 minutes. Under real-time ultrasound guidance, the right EJ vein was accessed with a 21 gauge micropuncture needle; the needle tip within the vein was confirmed with ultrasound image documentation. Needle exchanged over the 018 guidewire for transitional dilator, which allowed advancement of a Benson wire into the IVC. Over this, an MPA catheter was advanced. A Palindrome 23 hemodialysis catheter was tunneled from the right anterior chest wall approach to the right EJ dermatotomy site. The MPA catheter was exchanged over an Amplatz wire for serial vascular dilators which allow placement of a peel-away sheath, through which the catheter was advanced under intermittent fluoroscopy, positioned with its tips in the proximal and midright atrium. Spot chest radiograph confirms good catheter position. No pneumothorax. Catheter was flushed and primed per protocol. Catheter secured externally with O Prolene sutures. The right EJ dermatotomy site was closed with Dermabond. COMPLICATIONS: COMPLICATIONS None immediate FLUOROSCOPY TIME:  48 seconds; 2 mGy COMPARISON:  None IMPRESSION: 1. Technically successful placement of tunneled right EJ hemodialysis catheter with  ultrasound and fluoroscopic guidance. Ready for routine use. ACCESS: Remains  approachable for percutaneous intervention as needed. Electronically Signed   By: Lucrezia Europe M.D.   On: 01/31/2018 16:54   Ir US Guide Vasc Access Right  Result Date: 01/31/2018 CLINICAL DATA:  End-stage renal disease, needs durable venous access for hemodialysis. A temporary right IJ hemodialysis catheter was placed 01/16/2018. EXAM: TUNNELED HEMODIALYSIS CATHETER PLACEMENT WITH ULTRASOUND AND FLUOROSCOPIC GUIDANCE TECHNIQUE: The procedure, risks, benefits, and alternatives were explained to the patient. Questions regarding the procedure were encouraged and answered. The patient understands and consents to the procedure. As antibiotic prophylaxis, cefazolin 2 g was ordered pre-procedure and administered intravenously within one hour of incision. Patency of a good-sized right external jugular vein was confirmed with ultrasound with image documentation. An appropriate skin site was determined. Region was prepped using maximum barrier technique including cap and mask, sterile gown, sterile gloves, large sterile sheet, and Chlorhexidine as cutaneous antisepsis. The region was infiltrated locally with 1% lidocaine. Intravenous Fentanyl and Versed were administered as conscious sedation during continuous monitoring of the patient's level of consciousness and physiological / cardiorespiratory status by the radiology RN, with a total moderate sedation time of 17 minutes. Under real-time ultrasound guidance, the right EJ vein was accessed with a 21 gauge micropuncture needle; the needle tip within the vein was confirmed with ultrasound image documentation. Needle exchanged over the 018 guidewire for transitional dilator, which allowed advancement of a Benson wire into the IVC. Over this, an MPA catheter was advanced. A Palindrome 23 hemodialysis catheter was tunneled from the right anterior chest wall approach to the right EJ dermatotomy site. The MPA catheter was exchanged over an Amplatz wire for serial vascular  dilators which allow placement of a peel-away sheath, through which the catheter was advanced under intermittent fluoroscopy, positioned with its tips in the proximal and midright atrium. Spot chest radiograph confirms good catheter position. No pneumothorax. Catheter was flushed and primed per protocol. Catheter secured externally with O Prolene sutures. The right EJ dermatotomy site was closed with Dermabond. COMPLICATIONS: COMPLICATIONS None immediate FLUOROSCOPY TIME:  48 seconds; 2 mGy COMPARISON:  None IMPRESSION: 1. Technically successful placement of tunneled right EJ hemodialysis catheter with ultrasound and fluoroscopic guidance. Ready for routine use. ACCESS: Remains approachable for percutaneous intervention as needed. Electronically Signed   By: Lucrezia Europe M.D.   On: 01/31/2018 16:54    I have reviewed the patient's current medications.  Assessment/Plan: 1 AKI will place foley with his sx and hx. Appears to have some function.  Has xs vol, will try lasix 2 GIB stable 3 Afib 4 AS  5 Lung Ca 6 Renal mass P foley, lasix, follow urine, Cr   LOS: 24 days   Demoni Jacqulene Huntley 02/02/2018,10:40 AM

## 2018-02-02 NOTE — Progress Notes (Signed)
Physical Therapy Treatment Patient Details Name: Andrew Guerra MRN: [id number] DOB: Jun 19, 1940 Today's Date: 02/02/2018    History of Present Illness Andrew Guerra is a 78yo male who comes to [facility] on 01/09/18 after noted Hb: 6.5 at his PCP's office. Pt has subsequent kidney failure while admitted requiring HD, now with tunneled EJ cath (1/14). Pt also has kidney biopsy on 1/8, cardia catheterization, and a fall on 1/11 (ambien related per RN). Pt now being treated for PNA.     PT Comments    Pt continuing to progress towards achieving his current functional mobility goals. Of note, pt on 4L of O2 throughout with SPO2 decreasing to 84-86% with activity, requiring several minutes of seated break and pursed lip breathing to recover to low 90's. Pt's RN was notified. Pt would continue to benefit from skilled physical therapy services at this time while admitted and after d/c to address the below listed limitations in order to improve overall safety and independence with functional mobility.    Follow Up Recommendations  Outpatient PT;Supervision for mobility/OOB     Equipment Recommendations  Rolling walker with 5" wheels    Recommendations for Other Services       Precautions / Restrictions Precautions Precautions: Fall Restrictions Weight Bearing Restrictions: No    Mobility  Bed Mobility Overal bed mobility: Needs Assistance Bed Mobility: Supine to Sit     Supine to sit: Supervision     General bed mobility comments: supervision for safety  Transfers Overall transfer level: Needs assistance Equipment used: Rolling walker (2 wheeled);None Transfers: Sit to/from Stand Sit to Stand: Supervision         General transfer comment: supervision for safety, good technique; pt performed x1 from EOB and x10 from recliner  Ambulation/Gait Ambulation/Gait assistance: Supervision Gait Distance (Feet): 50 Feet Assistive device: Rolling walker (2 wheeled) Gait  Pattern/deviations: WFL(Within Functional Limits) Gait velocity: decreased   General Gait Details: no instability or LOB, supervision for safety, pt ambulating on 4L of O2   Stairs             Wheelchair Mobility    Modified Rankin (Stroke Patients Only)       Balance Overall balance assessment: Needs assistance Sitting-balance support: Feet supported Sitting balance-Leahy Scale: Good     Standing balance support: No upper extremity supported Standing balance-Leahy Scale: Fair                              Cognition Arousal/Alertness: Awake/alert Behavior During Therapy: WFL for tasks assessed/performed Overall Cognitive Status: Within Functional Limits for tasks assessed                                        Exercises Other Exercises Other Exercises: pt performed sit<>stand x10 from recliner chair with bilateral UEs pushing up from arm rests    General Comments        Pertinent Vitals/Pain Pain Assessment: No/denies pain    Home Living                      Prior Function            PT Goals (current goals can now be found in the care plan section) Acute Rehab PT Goals PT Goal Formulation: With patient Time For Goal Achievement: 02/08/18 Potential to Achieve Goals: Good  Progress towards PT goals: Progressing toward goals    Frequency    Min 3X/week      PT Plan Current plan remains appropriate    Co-evaluation              AM-PAC PT "6 Clicks" Mobility   Outcome Measure  Help needed turning from your back to your side while in a flat bed without using bedrails?: None Help needed moving from lying on your back to sitting on the side of a flat bed without using bedrails?: None Help needed moving to and from a bed to a chair (including a wheelchair)?: A Little Help needed standing up from a chair using your arms (e.g., wheelchair or bedside chair)?: None Help needed to walk in hospital room?: A  Little Help needed climbing 3-5 steps with a railing? : A Lot 6 Click Score: 20    End of Session Equipment Utilized During Treatment: Oxygen Activity Tolerance: Patient tolerated treatment well Patient left: with call bell/phone within reach;with family/visitor present;Other (comment)(sitting on BSC attempting to have a BM) Nurse Communication: Mobility status;Other (comment)(pt on BSC attempting to have a BM) PT Visit Diagnosis: Other abnormalities of gait and mobility (R26.89);Muscle weakness (generalized) (M62.81);Difficulty in walking, not elsewhere classified (R26.2)     Time: 1333-1400 PT Time Calculation (min) (ACUTE ONLY): 27 min  Charges:  $Gait Training: 8-22 mins $Therapeutic Exercise: 8-22 mins                     Sherie Don, [place], DPT  Acute Rehabilitation Services Pager [phone] Office [place] 02/02/2018, 4:38 PM

## 2018-02-02 NOTE — Progress Notes (Signed)
PIV consult: Arrived to room and pt's nurse had already obtained site. Cancel consult.

## 2018-02-02 NOTE — Progress Notes (Signed)
Pt computer would not allow me to open Epic due to sytem wide issue. Was not able to scan morning medications in (left note in [facility] as well) and was unable to find another computer to use. Medications were administered at 0916.

## 2018-02-03 ENCOUNTER — Inpatient Hospital Stay (HOSPITAL_COMMUNITY): Payer: Medicare Other

## 2018-02-03 LAB — CBC WITH DIFFERENTIAL/PLATELET
Band Neutrophils: 0 %
Basophils Absolute: 0 10*3/uL (ref 0.0–0.1)
Basophils Relative: 0 %
Blasts: 0 %
EOS PCT: 3 %
Eosinophils Absolute: 0.3 10*3/uL (ref 0.0–0.5)
HCT: 26.2 % — ABNORMAL LOW (ref 39.0–52.0)
Hemoglobin: 8.1 g/dL — ABNORMAL LOW (ref 13.0–17.0)
Lymphocytes Relative: 8 %
Lymphs Abs: 0.9 10*3/uL (ref 0.7–4.0)
MCH: 22.6 pg — ABNORMAL LOW (ref 26.0–34.0)
MCHC: 30.9 g/dL (ref 30.0–36.0)
MCV: 73 fL — ABNORMAL LOW (ref 80.0–100.0)
Metamyelocytes Relative: 0 %
Monocytes Absolute: 0.7 10*3/uL (ref 0.1–1.0)
Monocytes Relative: 6 %
Myelocytes: 0 %
Neutro Abs: 9.1 10*3/uL — ABNORMAL HIGH (ref 1.7–7.7)
Neutrophils Relative %: 83 %
Other: 0 %
PLATELETS: 328 10*3/uL (ref 150–400)
Promyelocytes Relative: 0 %
RBC: 3.59 MIL/uL — ABNORMAL LOW (ref 4.22–5.81)
RDW: 22.5 % — ABNORMAL HIGH (ref 11.5–15.5)
WBC: 11 10*3/uL — ABNORMAL HIGH (ref 4.0–10.5)
nRBC: 0 % (ref 0.0–0.2)
nRBC: 0 /100 WBC

## 2018-02-03 LAB — RENAL FUNCTION PANEL
Albumin: 1.7 g/dL — ABNORMAL LOW (ref 3.5–5.0)
Anion gap: 12 (ref 5–15)
BUN: 51 mg/dL — ABNORMAL HIGH (ref 8–23)
CO2: 25 mmol/L (ref 22–32)
Calcium: 8.1 mg/dL — ABNORMAL LOW (ref 8.9–10.3)
Chloride: 100 mmol/L (ref 98–111)
Creatinine, Ser: 4.99 mg/dL — ABNORMAL HIGH (ref 0.61–1.24)
GFR calc Af Amer: 12 mL/min — ABNORMAL LOW (ref >60)
GFR calc non Af Amer: 10 mL/min — ABNORMAL LOW (ref >60)
Glucose, Bld: 76 mg/dL (ref 70–99)
Phosphorus: 3.8 mg/dL (ref 2.5–4.6)
Potassium: 3.3 mmol/L — ABNORMAL LOW (ref 3.5–5.1)
Sodium: 137 mmol/L (ref 135–145)

## 2018-02-03 LAB — HEMOCHROMATOSIS DNA-PCR(C282Y,H63D)

## 2018-02-03 LAB — MAGNESIUM: Magnesium: 1.7 mg/dL (ref 1.7–2.4)

## 2018-02-03 MED ORDER — POTASSIUM CHLORIDE CRYS ER 20 MEQ PO TBCR
40.0000 meq | EXTENDED_RELEASE_TABLET | Freq: Once | ORAL | Status: AC
  Administered 2018-02-03: 40 meq via ORAL
  Filled 2018-02-03: qty 2

## 2018-02-03 NOTE — Progress Notes (Signed)
Subjective: Interval History: has no complaint , thinks more urine, strength better.  Objective: Vital signs in last 24 hours: Temp:  [97.3 F (36.3 C)-98.1 F (36.7 C)] 97.3 F (36.3 C) (01/17 0827) Pulse Rate:  [61-70] 66 (01/17 0827) Resp:  [16-24] 16 (01/17 0827) BP: (121-146)/(53-69) 125/53 (01/17 0827) SpO2:  [91 %-100 %] 95 % (01/17 0827) Weight:  [83.3 kg] 83.3 kg (01/17 0500) Weight change: 0.3 kg  Intake/Output from previous day: 01/16 0701 - 01/17 0700 In: 120 [P.O.:60] Out: 100 [Urine:100] Intake/Output this shift: No intake/output data recorded.  General appearance: cooperative and no distress Resp: diminished breath sounds bilaterally Chest wall: RIJ PC Cardio: S1, S2 normal and systolic murmur: systolic ejection 2/6, crescendo and decrescendo at 2nd left intercostal space GI: soft, non-tender; bowel sounds normal; no masses,  no organomegaly Extremities: edema 2-3+  Lab Results: Recent Labs    02/03/18 0513  WBC 11.0*  HGB 8.1*  HCT 26.2*  PLT 328   BMET:  Recent Labs    02/02/18 0646 02/03/18 0513  NA 139 137  K 3.4* 3.3*  CL 102 100  CO2 27 25  GLUCOSE 85 76  BUN 44* 51*  CREATININE 4.55* 4.99*  CALCIUM 8.1* 8.1*   No results for input(s): PTH in the last 72 hours. Iron Studies: No results for input(s): IRON, TIBC, TRANSFERRIN, FERRITIN in the last 72 hours.  Studies/Results: Dg Chest 2 View  Result Date: 02/03/2018 CLINICAL DATA:  Dyspnea. EXAM: CHEST - 2 VIEW COMPARISON:  Radiographs of January 29, 2018. FINDINGS: Stable cardiomediastinal silhouette. No pneumothorax is noted. Right internal jugular dialysis catheter is unchanged in position. Stable right basilar atelectasis or infiltrate is noted, with minimal left basilar subsegmental atelectasis. Bilateral pleural effusions are noted with right greater than left. Bony thorax is unremarkable. IMPRESSION: Stable right basilar atelectasis or infiltrate is noted with associated pleural  effusion. Minimal left basilar atelectasis is also noted with minimal left pleural effusion. Electronically Signed   By: Marijo Conception, M.D.   On: 02/03/2018 09:07    I have reviewed the patient's current medications.  Assessment/Plan: 1 AKI making very little urine but some function, does not need HD yet. Cont Lasix 2 Anemia 3 GIB 4 Afib NSR 5 Debill 6 AS P Lasix, follow  Urine, chem    LOS: 25 days   Elad Khai [place] 02/03/2018,9:46 AM

## 2018-02-03 NOTE — Progress Notes (Signed)
Physical Therapy Treatment Patient Details Name: Andrew Guerra MRN: [id number] DOB: January 12, 1941 Today's Date: 02/03/2018    History of Present Illness Andrew Guerra is a 78yo male who comes to [facility] on 01/09/18 after noted Hb: 6.5 at his PCP's office. Pt has subsequent kidney failure while admitted requiring HD, now with tunneled EJ cath (1/14). Pt also has kidney biopsy on 1/8, cardia catheterization, and a fall on 1/11 (ambien related per RN). Pt now being treated for PNA.     PT Comments    Pt making steady progress with functional mobility. Pt on 3L of supplemental O2 throughout with SPO2 decreasing to as low as 74% with ambulation with quick recovery to >90% with seated rest break of 5-10 seconds. Pt would continue to benefit from skilled physical therapy services at this time while admitted and after d/c to address the below listed limitations in order to improve overall safety and independence with functional mobility.   Follow Up Recommendations  Outpatient PT;Supervision for mobility/OOB     Equipment Recommendations  Rolling walker with 5" wheels    Recommendations for Other Services       Precautions / Restrictions Precautions Precautions: Fall Precaution Comments: monitor SPO2 Restrictions Weight Bearing Restrictions: No    Mobility  Bed Mobility               General bed mobility comments: pt OOB in recliner chair upon arrival  Transfers Overall transfer level: Needs assistance Equipment used: Rolling walker (2 wheeled) Transfers: Sit to/from Stand Sit to Stand: Supervision         General transfer comment: supervision for safety  Ambulation/Gait Ambulation/Gait assistance: Supervision Gait Distance (Feet): 20 Feet Assistive device: Rolling walker (2 wheeled) Gait Pattern/deviations: WFL(Within Functional Limits) Gait velocity: decreased   General Gait Details: no instability or LOB, supervision for safety, pt ambulating on 3L of  O2   Stairs             Wheelchair Mobility    Modified Rankin (Stroke Patients Only)       Balance Overall balance assessment: Needs assistance Sitting-balance support: Feet supported Sitting balance-Leahy Scale: Good     Standing balance support: No upper extremity supported Standing balance-Leahy Scale: Fair                              Cognition Arousal/Alertness: Awake/alert Behavior During Therapy: WFL for tasks assessed/performed Overall Cognitive Status: Within Functional Limits for tasks assessed                                        Exercises General Exercises - Lower Extremity Ankle Circles/Pumps: AROM;Strengthening;10 reps;Seated Long Arc Quad: AROM;Strengthening;Both;10 reps;Seated Hip Flexion/Marching: AROM;Strengthening;Both;20 reps;Seated;Standing Toe Raises: AROM;Strengthening;Both;10 reps;Seated Mini-Sqauts: AROM;Strengthening;Both;20 reps Other Exercises Other Exercises: pt working on standing balance and endurance activity, two bouts x5 mins with bilateral UE supports on RW with 3L of O2 with SPO2 >90%    General Comments        Pertinent Vitals/Pain Pain Assessment: No/denies pain    Home Living                      Prior Function            PT Goals (current goals can now be found in the care plan section) Acute Rehab PT Goals PT  Goal Formulation: With patient Time For Goal Achievement: 02/08/18 Potential to Achieve Goals: Good Progress towards PT goals: Progressing toward goals    Frequency    Min 3X/week      PT Plan Current plan remains appropriate    Co-evaluation              AM-PAC PT "6 Clicks" Mobility   Outcome Measure  Help needed turning from your back to your side while in a flat bed without using bedrails?: None Help needed moving from lying on your back to sitting on the side of a flat bed without using bedrails?: None Help needed moving to and from a bed  to a chair (including a wheelchair)?: A Little Help needed standing up from a chair using your arms (e.g., wheelchair or bedside chair)?: None Help needed to walk in hospital room?: A Little Help needed climbing 3-5 steps with a railing? : A Lot 6 Click Score: 20    End of Session Equipment Utilized During Treatment: Oxygen(3L of O2) Activity Tolerance: Patient tolerated treatment well Patient left: in chair;with call bell/phone within reach;with family/visitor present Nurse Communication: Mobility status PT Visit Diagnosis: Other abnormalities of gait and mobility (R26.89);Muscle weakness (generalized) (M62.81)     Time: 4196-2229 PT Time Calculation (min) (ACUTE ONLY): 24 min  Charges:  $Gait Training: 8-22 mins $Therapeutic Exercise: 8-22 mins                     Sherie Don, [place], DPT  Acute Rehabilitation Services Pager [phone] Office [place] 02/03/2018, 1:53 PM

## 2018-02-03 NOTE — Progress Notes (Signed)
Patient ID: Andrew Guerra, male   DOB: 05/06/1940, 78 y.o.   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  He was Hemoccult positive in the ED.  He was also noted to be in fluid overload and started on Lasix.  His renal function deteriorated and fluid overload was not responding to Lasix and he was eventually started on hemodialysis on 01/16/2018 as per nephrology.  Course complicated by paroxysmal A. fib.  He became confused and had a fall on 01/28/2018.  He was also treated for pneumonia +/- UTI.  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  Mental status probably back to baseline.  Ambien held, avoid all sedatives.  MRI of the brain was negative for acute abnormality.    Bilateral pneumonia with acute hypoxic respiratory failure and leukocytosis -Chest x-ray on 01/29/2018 showed bibasilar infiltrates.  Treated with Rocephin and Zithromax for 5 days and discontinued on 02/02/2018.   -Currently still on 4 L oxygen via nasal cannula.  Wean off oxygen as needed.  Incentive spirometry. -Chest x-ray from this morning does not show any  worsening.  Unclear if the patient has  UTI -Urine Culture grew ESBL E Coli. Symptoms had improved with above antibiotic regimen. E coli was probably colonizer.  Peptic ulcer disease with anemia -EGD on 01/10/2018 showed gastric and duodenal ulcers.  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  On PPI twice daily.  Outpatient follow-up with GI  Acute on chronic anemia -Due to chronic bleeding and iron deficiency -Status post 2 units packed red cells during this admission -Hemoglobin is 8.1 today.  Monitor hemoglobin.  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  Started dialysis on 01/16/2018 since his volume overload did not respond to Lasix.  Status post tunnel catheter placement by IR on 01/31/2018.  Patient was restarted on Lasix on 02/02/2018 and Foley catheter was placed as per nephrology recommendations.  Follow strict input and output.  Dialysis on hold for now.  Creatinine worsening.    Episodes of SVT/history of PAF -CHA2DS2-VASc Score 3 -Cardiology has already evaluated the patient and recommended amiodarone for 100 mg twice a day for 1 week starting 01/22/2018 followed by 200 mg twice a day for 2 weeks and then 200 mg once a day thereafter, to start on 02/11/2018.  Continue Coreg 3.125 mg p.o. twice daily. -Not on anticoagulation because of PUD and bleeding.  Will need to reconsider anticoagulation once ulcers have healed  Question of hemochromatosis -Noted on MRI on 12/09/2017 to have iron deposits and spleen and liver.  Patient is scheduled to see hematology as an outpatient in 03/2018.  Case was discussed with Dr. Learta Codding on 01/30/2018: Hemochromatosis PCR was obtained on 01/30/2018 which can be followed up as an outpatient.  Moderate aortic stenosis due to bicuspid aortic valve -Stable per cardiology.  Outpatient follow-up  NSCLC of  right upper lobe -Treated in 2017 at [facility] with right upper lobe lobectomy: Had right lung recurrence which was treated with radiation -Currently in remission: Follows up every 6 months at [facility]  Moderate malnutrition -Follow nutrition recommendations   DVT prophylaxis: Heparin subcutaneous Code Status: Full Family Communication: None at bedside Disposition Plan: Home once cleared by nephrology   Consultants: Cardiology/GI/nephrology/IR  Procedures:   EGD 12/24 LA Class A reflux esophagitis. - Non-bleeding gastric ulcers with no stigmata of bleeding. Biopsied. - Recent diagnosis of intestinal metaplasia and H pylori. Biopsies obtained for mapping and to document response to therapy, respectively. - Multiple non-bleeding duodenal ulcers with no stigmata of bleeding.  TTE 01/13/18 Impressions: EF 60 to 65% without regional wall motion normalities, - Compared to the exam from 10/24/17, the degree of right ventricular dilation has increased, and right ventricular systolic function has decreased. The degree of leftward septal shift and D shaped septum has worsened. There is now a trivial-small pericardial effusion. Side by side comparison of images performed  Antimicrobials:  Rocephin and Zithromax from 01/29/2018-02/02/2018  Subjective: Patient seen and examined at bedside.  Patient denies any worsening shortness of breath, chest pain or fever.  No overnight vomiting.   Objective: Vitals:   02/03/18 0500 02/03/18 0551 02/03/18 0807 02/03/18 0827  BP:  121/61  (!) 125/53  Pulse:  61  66  Resp:  19  16  Temp:  98 F (36.7 C)  (!) 97.3 F (36.3 C)  TempSrc:  Oral  Oral  SpO2:  93% 91% 95%  Weight: 83.3 kg     Height:        Intake/Output Summary (Last 24 hours) at 02/03/2018 0930 Last data filed at 02/03/2018 0551 Gross per 24 hour  Intake 120 ml  Output 100 ml  Net 20 ml   Filed Weights   02/01/18 0608 02/02/18 0500 02/03/18 0500  Weight: 83.6 kg 83 kg  83.3 kg    Examination:  General exam: Appears calm and comfortable.  No acute distress  respiratory system: Bilateral decreased breath sounds at bases, with some some basilar scattered crackles  cardiovascular system: Rate controlled, S1-S2 heard Gastrointestinal system: Abdomen is nondistended, soft and nontender. Normal bowel sounds heard. Extremities: 1+ edema, no cyanosis  Data Reviewed: I have personally reviewed following labs and imaging studies  CBC: Recent Labs  Lab 01/27/18 1311 01/28/18 1314 01/29/18 0739 01/29/18 2014 01/30/18 0359 01/31/18 0747 02/03/18 0513  WBC 23.1* 15.6*  --   --  14.0* 12.3* 11.0*  NEUTROABS  --   --   --   --   --   --  9.1*  HGB 7.5* 7.8* 7.3* 8.5* 8.3* 7.6* 8.1*  HCT 23.7* 24.5* 23.6* 27.7* 26.8* 25.0* 26.2*  MCV 73.6* 73.8*  --   --  73.8* 74.0* 73.0*  PLT 394 410*  --   --  344 316 158   Basic Metabolic Panel: Recent Labs  Lab 01/30/18 0359 01/31/18 0203 01/31/18 0747 02/01/18 0348 02/02/18 0646 02/03/18 0513  NA 136 136  --  138 139 137  K 3.2* 3.0*  --  3.4* 3.4* 3.3*  CL 99 100  --  105 102 100  CO2 27 26  --  25 27 25   GLUCOSE 109* 102*  --  99 85 76  BUN 47* 59*  --  30* 44* 51*  CREATININE 4.44* 5.23*  --  3.55* 4.55* 4.99*  CALCIUM 7.9* 8.0* 8.0* 7.7* 8.1* 8.1*  MG  --   --   --   --   --  1.7  PHOS 3.3 3.9  --  2.6 3.2 3.8   GFR: Estimated Creatinine Clearance: 12.6 mL/min (A) (by C-G formula based on SCr of 4.99 mg/dL (H)). Liver Function Tests: Recent Labs  Lab 01/30/18 0359 01/31/18 0203 02/01/18 0348 02/02/18 0646 02/03/18 0513  ALBUMIN 1.8* 1.6* 1.6* 1.7* 1.7*   No results for input(s): LIPASE, AMYLASE in the last 168 hours. No results for input(s): AMMONIA in the last 168 hours. Coagulation Profile: No results for input(s): INR, PROTIME in the last 168 hours. Cardiac Enzymes: No results for input(s): CKTOTAL, CKMB, CKMBINDEX, TROPONINI in the last 168 hours. BNP (last 3 results) No results for  input(s): PROBNP in the last 8760 hours. HbA1C: No results for input(s): HGBA1C in the last 72 hours. CBG: No results for input(s): GLUCAP in the last 168 hours. Lipid Profile: No results for input(s): CHOL, HDL, LDLCALC, TRIG, CHOLHDL, LDLDIRECT in the last 72 hours. Thyroid Function Tests: No results for input(s): TSH, T4TOTAL, FREET4, T3FREE, THYROIDAB in the last 72 hours. Anemia Panel: No results for input(s): VITAMINB12, FOLATE, FERRITIN, TIBC, IRON, RETICCTPCT in the last 72 hours. Sepsis Labs: No results for input(s): PROCALCITON, LATICACIDVEN in the last 168 hours.  Recent Results (from the past 240 hour(s))  Culture, Urine     Status: Abnormal   Collection Time: 01/30/18  7:59 AM  Result Value Ref Range Status   Specimen Description URINE, RANDOM  Final   Special Requests   Final    NONE Performed at [facility], [address], [place], [place] [address]    Culture (A)  Final    >=100,000 COLONIES/mL ESCHERICHIA COLI Confirmed Extended Spectrum Beta-Lactamase Producer (ESBL).  In bloodstream infections from ESBL organisms, carbapenems are preferred over piperacillin/tazobactam. They are shown to have a lower risk of mortality.    Report Status 02/01/2018 FINAL  Final   Organism ID, Bacteria ESCHERICHIA COLI (A)  Final      Susceptibility   Escherichia coli - MIC*    AMPICILLIN >=32 RESISTANT Resistant     CEFAZOLIN >=64 RESISTANT Resistant     CEFTRIAXONE RESISTANT Resistant     CIPROFLOXACIN >=4 RESISTANT Resistant     GENTAMICIN <=1 SENSITIVE Sensitive     IMIPENEM <=0.25 SENSITIVE Sensitive     NITROFURANTOIN <=16 SENSITIVE Sensitive     TRIMETH/SULFA >=320 RESISTANT Resistant     AMPICILLIN/SULBACTAM >=32 RESISTANT Resistant     PIP/TAZO 8 SENSITIVE Sensitive     Extended ESBL POSITIVE Resistant     * >=100,000 COLONIES/mL ESCHERICHIA COLI         Radiology Studies: Dg Chest 2 View  Result Date: 02/03/2018 CLINICAL DATA:  Dyspnea. EXAM: CHEST  - 2 VIEW COMPARISON:  Radiographs of January 29, 2018. FINDINGS: Stable cardiomediastinal silhouette. No pneumothorax is noted. Right internal jugular dialysis catheter is unchanged in position. Stable right basilar atelectasis or infiltrate is noted, with minimal left basilar subsegmental atelectasis. Bilateral pleural effusions are noted with right greater than left. Bony thorax is unremarkable. IMPRESSION: Stable right basilar atelectasis or infiltrate is noted with associated pleural effusion. Minimal left basilar atelectasis is also  noted with minimal left pleural effusion. Electronically Signed   By: Marijo Conception, M.D.   On: 02/03/2018 09:07        Scheduled Meds: . sodium chloride   Intravenous Once  . amiodarone  200 mg Oral BID  . Chlorhexidine Gluconate Cloth  6 each Topical Q0600  . feeding supplement (NEPRO CARB STEADY)  237 mL Oral TID BM  . furosemide  160 mg Oral BID  . heparin  5,000 Units Subcutaneous Q8H  . hydrocortisone   Rectal BID  . mouth rinse  15 mL Mouth Rinse BID  . midodrine  10 mg Oral TID WC  . mometasone-formoterol  2 puff Inhalation BID  . multivitamin  1 tablet Oral QHS  . Netarsudil-Latanoprost  1 drop Both Eyes QHS  . pantoprazole  40 mg Oral BID  . sodium chloride flush  3 mL Intravenous Q12H   Continuous Infusions: . sodium chloride 10 mL/hr at 02/02/18 0100     LOS: 25 days        Aline August, MD [facility] Pager [phone]  If 7PM-7AM, please contact night-coverage [email or web address] Password [email or web address] 02/03/2018, 9:30 AM

## 2018-02-04 LAB — CBC WITH DIFFERENTIAL/PLATELET
Abs Immature Granulocytes: 0.24 10*3/uL — ABNORMAL HIGH (ref 0.00–0.07)
Basophils Absolute: 0 10*3/uL (ref 0.0–0.1)
Basophils Relative: 0 %
Eosinophils Absolute: 0.3 10*3/uL (ref 0.0–0.5)
Eosinophils Relative: 3 %
HCT: 27.2 % — ABNORMAL LOW (ref 39.0–52.0)
Hemoglobin: 8.8 g/dL — ABNORMAL LOW (ref 13.0–17.0)
Immature Granulocytes: 2 %
LYMPHS ABS: 0.9 10*3/uL (ref 0.7–4.0)
Lymphocytes Relative: 8 %
MCH: 24 pg — AB (ref 26.0–34.0)
MCHC: 32.4 g/dL (ref 30.0–36.0)
MCV: 74.1 fL — ABNORMAL LOW (ref 80.0–100.0)
Monocytes Absolute: 0.7 10*3/uL (ref 0.1–1.0)
Monocytes Relative: 6 %
Neutro Abs: 9.2 10*3/uL — ABNORMAL HIGH (ref 1.7–7.7)
Neutrophils Relative %: 81 %
Platelets: 350 10*3/uL (ref 150–400)
RBC: 3.67 MIL/uL — ABNORMAL LOW (ref 4.22–5.81)
RDW: 23.2 % — ABNORMAL HIGH (ref 11.5–15.5)
WBC: 11.4 10*3/uL — ABNORMAL HIGH (ref 4.0–10.5)
nRBC: 0 % (ref 0.0–0.2)

## 2018-02-04 LAB — RENAL FUNCTION PANEL
Albumin: 1.8 g/dL — ABNORMAL LOW (ref 3.5–5.0)
Anion gap: 13 (ref 5–15)
BUN: 60 mg/dL — ABNORMAL HIGH (ref 8–23)
CHLORIDE: 102 mmol/L (ref 98–111)
CO2: 23 mmol/L (ref 22–32)
Calcium: 8.3 mg/dL — ABNORMAL LOW (ref 8.9–10.3)
Creatinine, Ser: 5.26 mg/dL — ABNORMAL HIGH (ref 0.61–1.24)
GFR calc Af Amer: 11 mL/min — ABNORMAL LOW (ref >60)
GFR calc non Af Amer: 10 mL/min — ABNORMAL LOW (ref >60)
Glucose, Bld: 91 mg/dL (ref 70–99)
Phosphorus: 3.5 mg/dL (ref 2.5–4.6)
Potassium: 3.7 mmol/L (ref 3.5–5.1)
Sodium: 138 mmol/L (ref 135–145)

## 2018-02-04 LAB — MAGNESIUM: Magnesium: 1.8 mg/dL (ref 1.7–2.4)

## 2018-02-04 NOTE — Progress Notes (Signed)
Subjective: Interval History: has no complaint making more urine.  Objective: Vital signs in last 24 hours: Temp:  [97.6 F (36.4 C)-98.1 F (36.7 C)] 97.8 F (36.6 C) (01/18 0552) Pulse Rate:  [62-64] 62 (01/18 0552) Resp:  [16-18] 18 (01/18 0552) BP: (128-156)/(61-69) 128/65 (01/18 0552) SpO2:  [95 %-100 %] 100 % (01/18 0722) Weight:  [85.6 kg] 85.6 kg (01/18 0552) Weight change: 2.3 kg  Intake/Output from previous day: 01/17 0701 - 01/18 0700 In: -  Out: 606 [Urine:605; Stool:1] Intake/Output this shift: No intake/output data recorded.  General appearance: alert, cooperative, no distress and slowed mentation Resp: rales bibasilar Chest wall: RIJ PC Cardio: S1, S2 normal and systolic murmur: systolic ejection 2/6, crescendo and decrescendo at 2nd left intercostal space GI: soft, non-tender; bowel sounds normal; no masses,  no organomegaly Extremities: edema 2+  Lab Results: Recent Labs    02/03/18 0513 02/04/18 0433  WBC 11.0* 11.4*  HGB 8.1* 8.8*  HCT 26.2* 27.2*  PLT 328 350   BMET:  Recent Labs    02/03/18 0513 02/04/18 0433  NA 137 138  K 3.3* 3.7  CL 100 102  CO2 25 23  GLUCOSE 76 91  BUN 51* 60*  CREATININE 4.99* 5.26*  CALCIUM 8.1* 8.3*   No results for input(s): PTH in the last 72 hours. Iron Studies: No results for input(s): IRON, TIBC, TRANSFERRIN, FERRITIN in the last 72 hours.  Studies/Results: Dg Chest 2 View  Result Date: 02/03/2018 CLINICAL DATA:  Dyspnea. EXAM: CHEST - 2 VIEW COMPARISON:  Radiographs of January 29, 2018. FINDINGS: Stable cardiomediastinal silhouette. No pneumothorax is noted. Right internal jugular dialysis catheter is unchanged in position. Stable right basilar atelectasis or infiltrate is noted, with minimal left basilar subsegmental atelectasis. Bilateral pleural effusions are noted with right greater than left. Bony thorax is unremarkable. IMPRESSION: Stable right basilar atelectasis or infiltrate is noted with  associated pleural effusion. Minimal left basilar atelectasis is also noted with minimal left pleural effusion. Electronically Signed   By: Marijo Conception, M.D.   On: 02/03/2018 09:07    I have reviewed the patient's current medications.  Assessment/Plan: 1 AKI.CKD3-4 now more urine. Hopefully will turn around. Vol xs improving.  Cr rising 2 GIB stable 3 Afib with RVR  4 Lung Ca  5 REnal mass 6 ? Early dementia 7 Debill P Lasix, foley, follow chem    LOS: 26 days   Seymour Bashar Milam 02/04/2018,9:38 AM

## 2018-02-04 NOTE — Progress Notes (Signed)
Patient ID: Andrew Guerra, male   DOB: Jan 27, 1940, 78 y.o.   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  He was Hemoccult positive in the ED.  He was also noted to be in fluid overload and started on Lasix.  His renal function deteriorated and fluid overload was not responding to Lasix and he was eventually started on hemodialysis on 01/16/2018 as per nephrology.  Course complicated by paroxysmal A. fib.  He became confused and had a fall on 01/28/2018.  He was also treated for pneumonia +/- UTI.  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  Mental status is back to baseline.  Ambien held, avoid all sedatives.  MRI of the brain was negative for acute abnormality.    Bilateral pneumonia with acute hypoxic respiratory failure and leukocytosis -Chest x-ray on 01/29/2018 showed bibasilar infiltrates.  Treated with Rocephin and Zithromax for 5 days and discontinued on 02/02/2018.   -Currently  on 2 L oxygen via nasal cannula.  Wean off oxygen as needed.  Incentive spirometry. -Chest x-ray from this morning does not show any worsening.  Unclear if  the patient has  UTI -Urine Culture grew ESBL E Coli. Symptoms had improved with above antibiotic regimen. E coli was probably colonizer.  Peptic ulcer disease with anemia -EGD on 01/10/2018 showed gastric and duodenal ulcers.  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  On PPI twice daily.  Outpatient follow-up with GI  Acute on chronic anemia -Due to chronic bleeding and iron deficiency -Status post 2 units packed red cells during this admission -Hemoglobin is 8.8 today.  Monitor hemoglobin.  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  Started dialysis on 01/16/2018 since his volume overload did not respond to Lasix.  Status post tunnel catheter placement by IR on 01/31/2018.  Patient was restarted on Lasix on 02/02/2018 and Foley catheter was placed as per nephrology recommendations.  Follow strict input and output.  Dialysis on hold for now.  Creatinine worsening, today is 5.26.    Episodes of SVT/history of PAF -CHA2DS2-VASc Score 3 -Cardiology has already evaluated the patient and recommended amiodarone for 100 mg twice a day for 1 week starting 01/22/2018 followed by 200 mg twice a day for 2 weeks and then 200 mg once a day thereafter, to start on 02/11/2018.  Continue Coreg 3.125 mg p.o. twice daily. -Not on anticoagulation because of PUD and bleeding.  Will need to reconsider anticoagulation once ulcers have healed  Question of hemochromatosis -Noted on MRI on 12/09/2017 to have iron deposits and spleen  and liver.  Patient is scheduled to see hematology as an outpatient in 03/2018.  Case was discussed with Dr. Learta Codding on 01/30/2018: Hemochromatosis PCR was obtained on 01/30/2018 which can be followed up as an outpatient.  Moderate aortic stenosis due to bicuspid aortic valve -Stable per cardiology.  Outpatient follow-up  NSCLC of right  upper lobe -Treated in 2017 at [facility] with right upper lobe lobectomy: Had right lung recurrence which was treated with radiation -Currently in remission: Follows up every 6 months at [facility]  Moderate malnutrition -Follow nutrition recommendations   DVT prophylaxis: Heparin subcutaneous Code Status: Full Family Communication: None at bedside Disposition Plan: Home once cleared by nephrology   Consultants: Cardiology/GI/nephrology/IR  Procedures:   EGD 12/24 LA Class A reflux esophagitis. - Non-bleeding gastric ulcers with no stigmata of bleeding. Biopsied. - Recent diagnosis of intestinal metaplasia and H pylori. Biopsies obtained for mapping and to document response to therapy, respectively. - Multiple non-bleeding duodenal ulcers with no stigmata of bleeding.  TTE 01/13/18 Impressions: EF 60 to 65% without regional wall motion normalities, - Compared to the exam from 10/24/17, the degree of right ventricular dilation has increased, and right ventricular systolic function has decreased. The degree of leftward septal shift and D shaped septum has worsened. There is now a trivial-small pericardial effusion. Side by side comparison of images performed  Antimicrobials:  Rocephin and Zithromax from 01/29/2018-02/02/2018  Subjective: Patient seen and examined at bedside.  No overnight fever, nausea or vomiting.  No worsening shortness of breath. Objective: Vitals:   02/03/18 1447 02/03/18 2043 02/04/18 0552 02/04/18 0722  BP: (!) 156/69 135/61 128/65   Pulse: 64 62 62   Resp: 16 16 18    Temp: 97.6 F (36.4 C) 98.1 F (36.7 C) 97.8 F (36.6 C)   TempSrc: Oral Oral Oral   SpO2: 100% 100% 95% 100%  Weight:   85.6 kg   Height:        Intake/Output Summary (Last 24 hours) at 02/04/2018 0939 Last data filed at 02/04/2018 0558 Gross per 24 hour  Intake -  Output 606 ml  Net -606 ml   Filed Weights   02/02/18 0500 02/03/18 0500 02/04/18 0552  Weight: 83 kg 83.3 kg  85.6 kg    Examination:  General exam: No acute distress. respiratory system: Bilateral decreased breath sounds at bases, with some some basilar scattered crackles.  No wheezing cardiovascular system: S1-S2 heard, rate controlled Gastrointestinal system: Abdomen is nondistended, soft and nontender. Normal bowel sounds heard. Extremities: 1-2+ edema, no cyanosis  Data Reviewed: I have personally reviewed following labs and imaging studies  CBC: Recent Labs  Lab 01/28/18 1314  01/29/18 2014 01/30/18 0359 01/31/18 0747 02/03/18 0513 02/04/18 0433  WBC 15.6*  --   --  14.0* 12.3* 11.0* 11.4*  NEUTROABS  --   --   --   --   --  9.1* 9.2*  HGB 7.8*   < > 8.5* 8.3* 7.6* 8.1* 8.8*  HCT 24.5*   < > 27.7* 26.8* 25.0* 26.2* 27.2*  MCV 73.8*  --   --  73.8* 74.0* 73.0* 74.1*  PLT 410*  --   --  344 316 328 350   < > = values in this interval not displayed.   Basic Metabolic Panel: Recent Labs  Lab 01/31/18 0203 01/31/18 0747 02/01/18 0348 02/02/18 0646 02/03/18 0513 02/04/18 0433  NA 136  --  138 139 137 138  K 3.0*  --  3.4* 3.4* 3.3* 3.7  CL 100  --  105 102 100 102  CO2 26  --  25 27 25 23   GLUCOSE 102*  --  99 85 76 91  BUN 59*  --  30* 44* 51* 60*  CREATININE 5.23*  --  3.55* 4.55* 4.99* 5.26*  CALCIUM 8.0* 8.0* 7.7* 8.1* 8.1* 8.3*  MG  --   --   --   --  1.7 1.8  PHOS 3.9  --  2.6 3.2 3.8 3.5   GFR: Estimated Creatinine Clearance: 12 mL/min (A) (by C-G formula based on SCr of 5.26 mg/dL (H)). Liver Function Tests: Recent Labs  Lab 01/31/18 0203 02/01/18 0348 02/02/18 0646 02/03/18 0513 02/04/18 0433  ALBUMIN 1.6* 1.6* 1.7* 1.7* 1.8*   No results for input(s): LIPASE, AMYLASE in the last 168 hours. No results for input(s): AMMONIA in the last 168 hours. Coagulation Profile: No results for input(s): INR, PROTIME in the last 168 hours. Cardiac Enzymes: No results for input(s): CKTOTAL, CKMB, CKMBINDEX, TROPONINI in the last 168 hours. BNP (last 3  results) No results for input(s): PROBNP in the last 8760 hours. HbA1C: No results for input(s): HGBA1C in the last 72 hours. CBG: No results for input(s): GLUCAP in the last 168 hours. Lipid Profile: No results for input(s): CHOL, HDL, LDLCALC, TRIG, CHOLHDL, LDLDIRECT in the last 72 hours. Thyroid Function Tests: No results for input(s): TSH, T4TOTAL, FREET4, T3FREE, THYROIDAB in the last 72 hours. Anemia Panel: No results for input(s): VITAMINB12, FOLATE, FERRITIN, TIBC, IRON, RETICCTPCT in the last 72 hours. Sepsis Labs: No results for input(s): PROCALCITON, LATICACIDVEN in the last 168 hours.  Recent Results (from the past 240 hour(s))  Culture, Urine     Status: Abnormal   Collection Time: 01/30/18  7:59 AM  Result Value Ref Range Status   Specimen Description URINE, RANDOM  Final   Special Requests   Final    NONE Performed at [facility], [address] [place], [place] [address]    Culture (A)  Final    >=100,000 COLONIES/mL ESCHERICHIA COLI Confirmed Extended Spectrum Beta-Lactamase Producer (ESBL).  In bloodstream infections from ESBL organisms, carbapenems are preferred over piperacillin/tazobactam. They are shown to have a lower risk of mortality.    Report Status 02/01/2018 FINAL  Final   Organism ID, Bacteria ESCHERICHIA COLI (A)  Final      Susceptibility   Escherichia coli - MIC*    AMPICILLIN >=32 RESISTANT Resistant     CEFAZOLIN >=64 RESISTANT Resistant     CEFTRIAXONE RESISTANT Resistant     CIPROFLOXACIN >=4 RESISTANT Resistant     GENTAMICIN <=1 SENSITIVE Sensitive     IMIPENEM <=0.25 SENSITIVE Sensitive     NITROFURANTOIN <=16 SENSITIVE Sensitive     TRIMETH/SULFA >=320 RESISTANT Resistant     AMPICILLIN/SULBACTAM >=32 RESISTANT Resistant     PIP/TAZO 8 SENSITIVE Sensitive     Extended ESBL POSITIVE Resistant     * >=100,000 COLONIES/mL ESCHERICHIA COLI         Radiology Studies: Dg Chest 2 View  Result Date: 02/03/2018 CLINICAL  DATA:  Dyspnea. EXAM: CHEST - 2 VIEW COMPARISON:  Radiographs of January 29, 2018. FINDINGS: Stable cardiomediastinal silhouette. No pneumothorax is noted. Right internal jugular dialysis catheter is unchanged in position. Stable right basilar atelectasis or infiltrate is noted, with minimal left basilar subsegmental atelectasis. Bilateral pleural effusions are noted with right greater than left. Bony thorax is unremarkable. IMPRESSION: Stable right basilar atelectasis or infiltrate is noted with associated  pleural effusion. Minimal left basilar atelectasis is also noted with minimal left pleural effusion. Electronically Signed   By: Marijo Conception, M.D.   On: 02/03/2018 09:07        Scheduled Meds: . sodium chloride   Intravenous Once  . amiodarone  200 mg Oral BID  . Chlorhexidine Gluconate Cloth  6 each Topical Q0600  . feeding supplement (NEPRO CARB STEADY)  237 mL Oral TID BM  . furosemide  160 mg Oral BID  . heparin  5,000 Units Subcutaneous Q8H  . hydrocortisone   Rectal BID  . mouth rinse  15 mL Mouth Rinse BID  . midodrine  10 mg Oral TID WC  . mometasone-formoterol  2 puff Inhalation BID  . multivitamin  1 tablet Oral QHS  . Netarsudil-Latanoprost  1 drop Both Eyes QHS  . pantoprazole  40 mg Oral BID  . sodium chloride flush  3 mL Intravenous Q12H   Continuous Infusions: . sodium chloride 10 mL/hr at 02/02/18 0100     LOS: 26 days        Aline August, MD [facility] Pager [phone]  If 7PM-7AM, please contact night-coverage [email or web address] Password [facility] 02/04/2018, 9:39 AM

## 2018-02-05 LAB — CBC WITH DIFFERENTIAL/PLATELET
Abs Immature Granulocytes: 0.27 10*3/uL — ABNORMAL HIGH (ref 0.00–0.07)
Basophils Absolute: 0 10*3/uL (ref 0.0–0.1)
Basophils Relative: 0 %
EOS ABS: 0.2 10*3/uL (ref 0.0–0.5)
Eosinophils Relative: 2 %
HCT: 28.7 % — ABNORMAL LOW (ref 39.0–52.0)
Hemoglobin: 9 g/dL — ABNORMAL LOW (ref 13.0–17.0)
Immature Granulocytes: 2 %
Lymphocytes Relative: 8 %
Lymphs Abs: 1 10*3/uL (ref 0.7–4.0)
MCH: 23.4 pg — ABNORMAL LOW (ref 26.0–34.0)
MCHC: 31.4 g/dL (ref 30.0–36.0)
MCV: 74.7 fL — ABNORMAL LOW (ref 80.0–100.0)
Monocytes Absolute: 0.7 10*3/uL (ref 0.1–1.0)
Monocytes Relative: 5 %
Neutro Abs: 10.1 10*3/uL — ABNORMAL HIGH (ref 1.7–7.7)
Neutrophils Relative %: 83 %
Platelets: 386 10*3/uL (ref 150–400)
RBC: 3.84 MIL/uL — ABNORMAL LOW (ref 4.22–5.81)
RDW: 23.9 % — ABNORMAL HIGH (ref 11.5–15.5)
WBC: 12.3 10*3/uL — ABNORMAL HIGH (ref 4.0–10.5)
nRBC: 0 % (ref 0.0–0.2)

## 2018-02-05 LAB — RENAL FUNCTION PANEL
Albumin: 2 g/dL — ABNORMAL LOW (ref 3.5–5.0)
Anion gap: 13 (ref 5–15)
BUN: 67 mg/dL — ABNORMAL HIGH (ref 8–23)
CALCIUM: 8.5 mg/dL — AB (ref 8.9–10.3)
CO2: 25 mmol/L (ref 22–32)
Chloride: 102 mmol/L (ref 98–111)
Creatinine, Ser: 5.09 mg/dL — ABNORMAL HIGH (ref 0.61–1.24)
GFR calc Af Amer: 12 mL/min — ABNORMAL LOW (ref >60)
GFR calc non Af Amer: 10 mL/min — ABNORMAL LOW (ref >60)
Glucose, Bld: 83 mg/dL (ref 70–99)
Phosphorus: 3.8 mg/dL (ref 2.5–4.6)
Potassium: 3.5 mmol/L (ref 3.5–5.1)
Sodium: 140 mmol/L (ref 135–145)

## 2018-02-05 LAB — MAGNESIUM: Magnesium: 1.8 mg/dL (ref 1.7–2.4)

## 2018-02-05 MED ORDER — GERHARDT'S BUTT CREAM
TOPICAL_CREAM | Freq: Three times a day (TID) | CUTANEOUS | Status: DC
  Administered 2018-02-05 – 2018-02-08 (×10): via TOPICAL
  Filled 2018-02-05: qty 1

## 2018-02-05 NOTE — Progress Notes (Signed)
Patient ID: KIRKE BREACH, male   DOB: [birth year], 78 y.o.   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  He was Hemoccult positive in the ED.  He was also noted to be in fluid overload and started on Lasix.  His renal function deteriorated and fluid overload was not responding to Lasix and he was eventually started on hemodialysis on 01/16/2018 as per nephrology.  Course complicated by paroxysmal A. fib.  He became confused and had a fall on 01/28/2018.  He was also treated for pneumonia +/- UTI.  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  Mental status is back to baseline.  Ambien held, avoid all sedatives.  MRI of the brain was negative for acute abnormality.    Bilateral pneumonia with acute hypoxic respiratory failure and leukocytosis -Chest x-ray on 01/29/2018 showed bibasilar infiltrates.  Treated with Rocephin and Zithromax for 5 days and discontinued on 02/02/2018.   -Currently  on 3 L oxygen via nasal cannula.  Wean off oxygen as needed.  Incentive spirometry.  Unclear if the patient has  UTI -Urine Culture grew ESBL E Coli. Symptoms  had improved with above antibiotic regimen. E coli was probably colonizer.  Peptic ulcer disease with anemia -EGD on 01/10/2018 showed gastric and duodenal ulcers.  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  On PPI twice daily.  Outpatient follow-up with GI  Acute on chronic anemia -Due to chronic bleeding and iron deficiency -Status post 2 units packed red cells during this admission -Hemoglobin is 9 today.  Monitor hemoglobin.  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  Started dialysis on 01/16/2018 since his volume overload did not respond to Lasix.  Status post tunnel catheter placement by IR on 01/31/2018.  Patient was restarted on Lasix on 02/02/2018 and Foley catheter was placed as per nephrology recommendations.  Follow strict input and output.  Dialysis on hold for now.  Creatinine worsening, today is 5.09.  Still making only very little urine.  Episodes of SVT/history of PAF -CHA2DS2-VASc Score 3 -Cardiology has already evaluated the patient and recommended amiodarone for 100 mg twice a day for 1 week starting 01/22/2018 followed by 200 mg twice a day for 2 weeks and then 200 mg once a day thereafter, to start on 02/11/2018.  Continue Coreg 3.125 mg p.o. twice daily. -Not on anticoagulation because of PUD and bleeding.  Will need to reconsider anticoagulation once ulcers have healed  Question of hemochromatosis -Noted on MRI on 12/09/2017 to have iron deposits and spleen and liver.  Patient is  scheduled to see hematology as an outpatient in 03/2018.  Case was discussed with Dr. Learta Codding on 01/30/2018: Hemochromatosis PCR was obtained on 01/30/2018 which can be followed up as an outpatient.  Moderate aortic stenosis due to bicuspid aortic valve -Stable per cardiology.  Outpatient follow-up  NSCLC of right upper lobe -Treated in 2017 at  [facility] with right upper lobe lobectomy: Had right lung recurrence which was treated with radiation -Currently in remission: Follows up every 6 months at [facility]  Moderate malnutrition -Follow nutrition recommendations   DVT prophylaxis: Heparin subcutaneous Code Status: Full Family Communication: None at bedside Disposition Plan: Home once cleared by nephrology   Consultants: Cardiology/GI/nephrology/IR  Procedures:   EGD 12/24 LA Class A reflux esophagitis. - Non-bleeding gastric ulcers with no stigmata of bleeding. Biopsied. - Recent diagnosis of intestinal metaplasia and H pylori. Biopsies obtained for mapping and to document response to therapy, respectively. - Multiple non-bleeding duodenal ulcers with no stigmata of bleeding.  TTE 01/13/18 Impressions: EF 60 to 65% without regional wall motion normalities, - Compared to the exam from 10/24/17, the degree of right ventricular dilation has increased, and right ventricular systolic function has decreased. The degree of leftward septal shift and D shaped septum has worsened. There is now a trivial-small pericardial effusion. Side by side comparison of images performed  Antimicrobials:  Rocephin and Zithromax from 01/29/2018-02/02/2018  Subjective: Patient seen and examined at bedside.  Patient denies any worsening shortness of breath or chest pain overnight.  No fever or vomiting.    Objective: Vitals:   02/04/18 1424 02/04/18 2211 02/05/18 0543 02/05/18 0756  BP: (!) 146/70 (!) 151/67 139/66   Pulse: 67 64 63   Resp: 16 18 18    Temp: 97.6 F (36.4 C) 97.7 F (36.5 C) 98.1 F (36.7 C)   TempSrc: Oral Oral Oral   SpO2: 100%  100% 100%  Weight:   85.6 kg   Height:        Intake/Output Summary (Last 24 hours) at 02/05/2018 1054 Last data filed at 02/05/2018 0635 Gross per 24 hour  Intake -  Output 525 ml  Net -525 ml   Filed Weights   02/03/18 0500 02/04/18 0552 02/05/18 0543  Weight: 83.3 kg 85.6 kg  85.6 kg    Examination:  General exam: No distress. respiratory system: Bilateral decreased breath sounds at bases, with some some basilar scattered crackles.   cardiovascular system: Rate controlled, S1-S2 heard Gastrointestinal system: Abdomen is nondistended, soft and nontender. Normal bowel sounds heard. Extremities: 1-2+ edema, no cyanosis  Data Reviewed: I have personally reviewed following labs and imaging studies  CBC: Recent Labs  Lab 01/30/18 0359 01/31/18 0747 02/03/18 0513 02/04/18 0433 02/05/18 0555  WBC 14.0* 12.3* 11.0* 11.4* 12.3*  NEUTROABS  --   --  9.1* 9.2* 10.1*  HGB 8.3* 7.6* 8.1* 8.8* 9.0*  HCT 26.8* 25.0* 26.2* 27.2* 28.7*  MCV 73.8* 74.0* 73.0* 74.1* 74.7*  PLT 344 316 328 350 660   Basic Metabolic Panel: Recent Labs  Lab 02/01/18 0348 02/02/18 0646 02/03/18 0513 02/04/18 0433 02/05/18 0555  NA 138 139 137 138 140  K 3.4* 3.4* 3.3* 3.7 3.5  CL 105 102 100 102 102  CO2 25 27 25 23 25   GLUCOSE 99 85 76 91 83  BUN 30* 44* 51* 60* 67*  CREATININE 3.55* 4.55* 4.99* 5.26* 5.09*  CALCIUM 7.7* 8.1* 8.1* 8.3* 8.5*  MG  --   --  1.7 1.8 1.8  PHOS 2.6 3.2 3.8  3.5 3.8   GFR: Estimated Creatinine Clearance: 12.4 mL/min (A) (by C-G formula based on SCr of 5.09 mg/dL (H)). Liver Function Tests: Recent Labs  Lab 02/01/18 0348 02/02/18 0646 02/03/18 0513 02/04/18 0433 02/05/18 0555  ALBUMIN 1.6* 1.7* 1.7* 1.8* 2.0*   No results for input(s): LIPASE, AMYLASE in the last 168 hours. No results for input(s): AMMONIA in the last 168 hours. Coagulation Profile: No results for input(s): INR, PROTIME in the last 168 hours. Cardiac Enzymes: No results for input(s): CKTOTAL, CKMB, CKMBINDEX, TROPONINI in the last 168 hours. BNP (last 3 results) No results for input(s): PROBNP in the last 8760 hours. HbA1C: No results for input(s): HGBA1C in the last 72 hours. CBG: No results for input(s): GLUCAP in the last 168 hours. Lipid Profile: No results for  input(s): CHOL, HDL, LDLCALC, TRIG, CHOLHDL, LDLDIRECT in the last 72 hours. Thyroid Function Tests: No results for input(s): TSH, T4TOTAL, FREET4, T3FREE, THYROIDAB in the last 72 hours. Anemia Panel: No results for input(s): VITAMINB12, FOLATE, FERRITIN, TIBC, IRON, RETICCTPCT in the last 72 hours. Sepsis Labs: No results for input(s): PROCALCITON, LATICACIDVEN in the last 168 hours.  Recent Results (from the past 240 hour(s))  Culture, Urine     Status: Abnormal   Collection Time: 01/30/18  7:59 AM  Result Value Ref Range Status   Specimen Description URINE, RANDOM  Final   Special Requests   Final    NONE Performed at [facility], [address], [place], [place] [address]    Culture (A)  Final    >=100,000 COLONIES/mL ESCHERICHIA COLI Confirmed Extended Spectrum Beta-Lactamase Producer (ESBL).  In bloodstream infections from ESBL organisms, carbapenems are preferred over piperacillin/tazobactam. They are shown to have a lower risk of mortality.    Report Status 02/01/2018 FINAL  Final   Organism ID, Bacteria ESCHERICHIA COLI (A)  Final      Susceptibility   Escherichia coli - MIC*    AMPICILLIN >=32 RESISTANT Resistant     CEFAZOLIN >=64 RESISTANT Resistant     CEFTRIAXONE RESISTANT Resistant     CIPROFLOXACIN >=4 RESISTANT Resistant     GENTAMICIN <=1 SENSITIVE Sensitive     IMIPENEM <=0.25 SENSITIVE Sensitive     NITROFURANTOIN <=16 SENSITIVE Sensitive     TRIMETH/SULFA >=320 RESISTANT Resistant     AMPICILLIN/SULBACTAM >=32 RESISTANT Resistant     PIP/TAZO 8 SENSITIVE Sensitive     Extended ESBL POSITIVE Resistant     * >=100,000 COLONIES/mL ESCHERICHIA COLI         Radiology Studies: No results found.      Scheduled Meds: . sodium chloride   Intravenous Once  . amiodarone  200 mg Oral BID  . Chlorhexidine Gluconate Cloth  6 each Topical Q0600  . feeding supplement (NEPRO CARB STEADY)  237 mL Oral TID BM  . furosemide  160 mg Oral BID  . heparin   5,000 Units Subcutaneous Q8H  . hydrocortisone   Rectal BID  . mouth rinse  15 mL Mouth Rinse BID  . midodrine  10 mg Oral TID WC  . mometasone-formoterol  2 puff Inhalation BID  . multivitamin  1 tablet Oral QHS  . Netarsudil-Latanoprost  1 drop Both Eyes QHS  . pantoprazole  40 mg Oral BID  . sodium chloride flush  3 mL Intravenous Q12H   Continuous Infusions: . sodium chloride 10 mL/hr at 02/02/18 0100     LOS: 27 days        Skylar Priest Starla Link,  MD [facility] Pager [phone]  If 7PM-7AM, please contact night-coverage [email or web address] Password [facility] 02/05/2018, 10:54 AM

## 2018-02-05 NOTE — Consult Note (Signed)
[place] Nurse wound consult note Reason for Consult: Patient complaining of soreness at apex of gluteal cleft, also at top of ears secondary to oxygen tubing.  Relief after repositioning tube and standing, no open areas noted. Wound type: N/A Pressure Injury POA: N/A Measurement:N/A Wound bed:N/A Drainage (amount, consistency, odor) None Periwound:intact, dry Dressing procedure/placement/frequency: Patient already has a pressure redistribution chair cushion, but it is beneath a pillow AND an anti-fall pad/alarm.  I have rearranged the pillows/padding and he is now sitting upon the pressure redistribution chair pad and the anti-fall alarm. We will provide Gerhart's Butt cream for the apex of the buttock crease and patient has padding to wear at the top of his ears to prevent tissue trauma from oxygen tubing.  He is instructed to stand when he gets tired of sitting and to lie on his side at night. He knows to wear his oxygen tubing padding. He is eagerly anticipating discharge in the next 2-3 days.  [place] nursing team will not follow, but will remain available to this patient, the nursing and medical teams.  Please re-consult if needed. Thanks, Maudie Flakes, MSN, RN, [place], Arther Abbott  Pager# [phone]

## 2018-02-05 NOTE — Progress Notes (Signed)
Subjective: Interval History: has complaints feels better, just not much appetitie.  Objective: Vital signs in last 24 hours: Temp:  [97.6 F (36.4 C)-98.1 F (36.7 C)] 98.1 F (36.7 C) (01/19 0543) Pulse Rate:  [63-67] 63 (01/19 0543) Resp:  [16-18] 18 (01/19 0543) BP: (139-151)/(66-70) 139/66 (01/19 0543) SpO2:  [100 %] 100 % (01/19 0756) Weight:  [85.6 kg] 85.6 kg (01/19 0543) Weight change: 0 kg  Intake/Output from previous day: 01/18 0701 - 01/19 0700 In: -  Out: 525 [Urine:525] Intake/Output this shift: No intake/output data recorded.  General appearance: alert, cooperative and chronically ill Resp: diminished breath sounds bilaterally Chest wall: RIJ PC Cardio: S1, S2 normal and systolic murmur: systolic ejection 2/6, crescendo and decrescendo at 2nd left intercostal space GI: soft, liver down 5 cm Extremities: edema 2+  Lab Results: Recent Labs    02/04/18 0433 02/05/18 0555  WBC 11.4* 12.3*  HGB 8.8* 9.0*  HCT 27.2* 28.7*  PLT 350 386   BMET:  Recent Labs    02/04/18 0433 02/05/18 0555  NA 138 140  K 3.7 3.5  CL 102 102  CO2 23 25  GLUCOSE 91 83  BUN 60* 67*  CREATININE 5.26* 5.09*  CALCIUM 8.3* 8.5*   No results for input(s): PTH in the last 72 hours. Iron Studies: No results for input(s): IRON, TIBC, TRANSFERRIN, FERRITIN in the last 72 hours.  Studies/Results: No results found.  I have reviewed the patient's current medications.  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  Vol xs slowly better . Hopefully will see recovery 2 Anemia esa/Fe 3 Afib with RVR 4 GIB 5 Debill signif P lasix, follow Cr.     LOS: 27 days   Klark Zaraya Delauder 02/05/2018,10:39 AM

## 2018-02-05 NOTE — Plan of Care (Signed)
  Problem: Education: Goal: Knowledge of General Education information will improve Description Including pain rating scale, medication(s)/side effects and non-pharmacologic comfort measures Outcome: Progressing   Problem: Health Behavior/Discharge Planning: Goal: Ability to manage health-related needs will improve Outcome: Progressing   Problem: Clinical Measurements: Goal: Ability to maintain clinical measurements within normal limits will improve Outcome: Progressing Goal: Will remain free from infection Outcome: Progressing Goal: Diagnostic test results will improve Outcome: Progressing Goal: Respiratory complications will improve Outcome: Progressing Goal: Cardiovascular complication will be avoided Outcome: Progressing   Problem: Activity: Goal: Risk for activity intolerance will decrease Outcome: Progressing   Problem: Nutrition: Goal: Adequate nutrition will be maintained Outcome: Progressing   Problem: Coping: Goal: Level of anxiety will decrease Outcome: Progressing   Problem: Elimination: Goal: Will not experience complications related to bowel motility Outcome: Progressing Goal: Will not experience complications related to urinary retention Outcome: Progressing   Problem: Pain Managment: Goal: General experience of comfort will improve Outcome: Progressing   Problem: Safety: Goal: Ability to remain free from injury will improve Outcome: Progressing   Problem: Skin Integrity: Goal: Risk for impaired skin integrity will decrease Outcome: Progressing   Problem: Education: Goal: Knowledge of disease and its progression will improve Outcome: Progressing Goal: Individualized Educational Video(s) Outcome: Progressing   Problem: Fluid Volume: Goal: Compliance with measures to maintain balanced fluid volume will improve Outcome: Progressing   Problem: Health Behavior/Discharge Planning: Goal: Ability to manage health-related needs will  improve Outcome: Progressing   Problem: Nutritional: Goal: Ability to make healthy dietary choices will improve Outcome: Progressing   Problem: Clinical Measurements: Goal: Complications related to the disease process, condition or treatment will be avoided or minimized Outcome: Progressing   Problem: Education: Goal: Ability to demonstrate management of disease process will improve Outcome: Progressing Goal: Ability to verbalize understanding of medication therapies will improve Outcome: Progressing Goal: Individualized Educational Video(s) Outcome: Progressing   Problem: Activity: Goal: Capacity to carry out activities will improve Outcome: Progressing   Problem: Cardiac: Goal: Ability to achieve and maintain adequate cardiopulmonary perfusion will improve Outcome: Progressing

## 2018-02-06 LAB — RENAL FUNCTION PANEL
Albumin: 1.8 g/dL — ABNORMAL LOW (ref 3.5–5.0)
Anion gap: 11 (ref 5–15)
BUN: 71 mg/dL — ABNORMAL HIGH (ref 8–23)
CO2: 25 mmol/L (ref 22–32)
CREATININE: 5.29 mg/dL — AB (ref 0.61–1.24)
Calcium: 8.2 mg/dL — ABNORMAL LOW (ref 8.9–10.3)
Chloride: 102 mmol/L (ref 98–111)
GFR calc Af Amer: 11 mL/min — ABNORMAL LOW (ref >60)
GFR calc non Af Amer: 10 mL/min — ABNORMAL LOW (ref >60)
Glucose, Bld: 74 mg/dL (ref 70–99)
PHOSPHORUS: 4.2 mg/dL (ref 2.5–4.6)
Potassium: 3.4 mmol/L — ABNORMAL LOW (ref 3.5–5.1)
Sodium: 138 mmol/L (ref 135–145)

## 2018-02-06 LAB — MAGNESIUM: Magnesium: 1.5 mg/dL — ABNORMAL LOW (ref 1.7–2.4)

## 2018-02-06 MED ORDER — MAGNESIUM SULFATE 2 GM/50ML IV SOLN
2.0000 g | Freq: Once | INTRAVENOUS | Status: AC
  Administered 2018-02-06: 2 g via INTRAVENOUS
  Filled 2018-02-06: qty 50

## 2018-02-06 MED ORDER — POTASSIUM CHLORIDE CRYS ER 20 MEQ PO TBCR
40.0000 meq | EXTENDED_RELEASE_TABLET | Freq: Once | ORAL | Status: AC
  Administered 2018-02-06: 40 meq via ORAL
  Filled 2018-02-06: qty 2

## 2018-02-06 MED ORDER — TORSEMIDE 20 MG PO TABS
100.0000 mg | ORAL_TABLET | Freq: Every day | ORAL | Status: DC
  Administered 2018-02-07 – 2018-02-09 (×3): 100 mg via ORAL
  Filled 2018-02-06 (×3): qty 5

## 2018-02-06 NOTE — Progress Notes (Signed)
Physical Therapy Treatment Patient Details Name: Andrew Guerra MRN: [id number] DOB: 1940/02/13 Today's Date: 02/06/2018    History of Present Illness Andrew Guerra is a 78yo male who comes to [facility] on 01/09/18 after noted Hb: 6.5 at his PCP's office. Pt has subsequent kidney failure while admitted requiring HD, now with tunneled EJ cath (1/14). Pt also has kidney biopsy on 1/8, cardia catheterization, and a fall on 1/11 (ambien related per RN). Pt now being treated for PNA.     PT Comments    Patient seen for mobility progression. On 3L supplemental O2 for all mobility - slight desat to 88% with mobility, however quickly recovered with seated rest break and cueing for pursed lip breathing. Making good progress towards goals. Will continue to follow.     Follow Up Recommendations  Outpatient PT;Supervision for mobility/OOB     Equipment Recommendations  Rolling walker with 5" wheels    Recommendations for Other Services       Precautions / Restrictions Precautions Precautions: Fall Precaution Comments: monitor SPO2 Restrictions Weight Bearing Restrictions: No    Mobility  Bed Mobility               General bed mobility comments: pt OOB in recliner chair upon arrival  Transfers Overall transfer level: Needs assistance Equipment used: Rolling walker (2 wheeled) Transfers: Sit to/from Stand Sit to Stand: Supervision         General transfer comment: supervision for safety  Ambulation/Gait Ambulation/Gait assistance: Min guard;Supervision Gait Distance (Feet): 160 Feet Assistive device: Rolling walker (2 wheeled) Gait Pattern/deviations: WFL(Within Functional Limits) Gait velocity: decreased   General Gait Details: no instability; min guard for safety; on 3L O2   Stairs             Wheelchair Mobility    Modified Rankin (Stroke Patients Only)       Balance Overall balance assessment: Needs assistance Sitting-balance support: Feet  supported Sitting balance-Leahy Scale: Good     Standing balance support: Bilateral upper extremity supported;During functional activity Standing balance-Leahy Scale: Fair                              Cognition Arousal/Alertness: Awake/alert Behavior During Therapy: WFL for tasks assessed/performed Overall Cognitive Status: Within Functional Limits for tasks assessed                                        Exercises      General Comments        Pertinent Vitals/Pain Pain Assessment: No/denies pain    Home Living                      Prior Function            PT Goals (current goals can now be found in the care plan section) Acute Rehab PT Goals Patient Stated Goal: regain strength  PT Goal Formulation: With patient Time For Goal Achievement: 02/08/18 Potential to Achieve Goals: Good Progress towards PT goals: Progressing toward goals    Frequency    Min 3X/week      PT Plan Current plan remains appropriate    Co-evaluation              AM-PAC PT "6 Clicks" Mobility   Outcome Measure  Help needed turning from your back to  your side while in a flat bed without using bedrails?: None Help needed moving from lying on your back to sitting on the side of a flat bed without using bedrails?: None Help needed moving to and from a bed to a chair (including a wheelchair)?: A Little Help needed standing up from a chair using your arms (e.g., wheelchair or bedside chair)?: A Little Help needed to walk in hospital room?: A Little Help needed climbing 3-5 steps with a railing? : A Little 6 Click Score: 20    End of Session Equipment Utilized During Treatment: Gait belt;Oxygen(3L) Activity Tolerance: Patient tolerated treatment well Patient left: in chair;with call bell/phone within reach;with family/visitor present Nurse Communication: Mobility status PT Visit Diagnosis: Other abnormalities of gait and mobility  (R26.89);Muscle weakness (generalized) (M62.81)     Time: 5956-3875 PT Time Calculation (min) (ACUTE ONLY): 21 min  Charges:  $Gait Training: 8-22 mins                      Lanney Gins, PT, DPT Supplemental Physical Therapist 02/06/18 3:38 PM Pager: [phone] Office: [phone]

## 2018-02-06 NOTE — Progress Notes (Signed)
Nutrition Education Note  Per pt request, RD provided renal diet education with materials from the [employer].  Explained why diet restrictions are needed and provided lists of foods to limit/avoid that are high potassium, sodium, and phosphorus. Provided specific recommendations on safer alternatives of these foods. Strongly encouraged compliance of this diet.   Discussed importance of protein intake at each meal and snack. Provided examples of how to maximize protein intake throughout the day. Discussed need for fluid restriction with dialysis, importance of minimizing weight gain between HD treatments, and renal-friendly beverage options.  Encouraged pt to discuss specific diet questions/concerns with RD at HD outpatient facility. Teach back method used.  Expect good compliance.  Body mass index is 26.69 kg/m. Pt meets criteria for overweight based on current BMI.  Current diet order is Renal with 1200 mL fluid restriction, patient is consuming approximately 25% of meals and 60% of supplements at this time. Labs and medications reviewed - potassium and magnesium are low. No further nutrition interventions warranted at this time. RD contact information provided. If additional nutrition issues arise, please re-consult RD.  Althea Grimmer, MS, RDN, LDN Pager: [phone] Available Mondays and Fridays, 9am-2pm

## 2018-02-06 NOTE — Progress Notes (Addendum)
Patient ID: Andrew Guerra, male   DOB: 01-Nov-1940, 78 y.o.   MRN: [id number] [facility] Progress Note   Assessment/ Plan:   1.  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  With unimpressive urine output charted overnight (patient reports leakage around Foley catheter) in response to furosemide but essentially plateaued creatinine and without indication for hemodialysis.  We will convert him over from furosemide to torsemide (because of hypoalbuminemia) and monitor urine output/labs for at least 24-48 hours before making decision regarding discharge with or without hemodialysis.  I would favor discharging him home with his dialysis catheter that can be discontinued pending follow-up labs which may help limit another procedure should he need chronic dialysis. 2.  Hypokalemia: Mild likely secondary to ongoing diuretic use, will give low-dose potassium supplement by mouth. 3.  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  Currently appears to be rate controlled on amiodarone.  Not on anticoagulation. 4.  GI bleed with acute blood loss anemia: EGD showing gastric and duodenal ulcers for which he is on PPI without additional overt bleed.  Hemoglobin/hematocrit stable.  Subjective:   Reports to be feeling fair, concerned about leakage around his Foley catheter   Objective:   BP (!) 150/66 (BP Location: Left Arm)   Pulse 66   Temp 97.9 F (36.6 C) (Oral)   Resp 16   Ht 5' 10.5" (1.791 m)   Wt 85.6 kg   SpO2 100%   BMI 26.69 kg/m   Intake/Output Summary (Last 24 hours) at 02/06/2018 1020 Last data filed at 02/06/2018 0923 Gross per 24 hour  Intake 1037.5 ml  Output 855 ml  Net 182.5 ml    Weight change:   Physical Exam: Gen: Comfortably sitting up in recliner. CVS: Pulse regular rhythm, normal rate, S1 and S2 with an ejection systolic murmur Resp: Diminished breath sounds over bases, no distinct rales or rhonchi Abd: Soft, obese, nontender Ext: 2-3+ pitting lower extremity edema  Labs: BMET Recent Labs  Lab 01/31/18 0203 01/31/18 0747 02/01/18 0348 02/02/18 0646 02/03/18 0513 02/04/18 0433 02/05/18 0555 02/06/18 0457  NA 136  --  138 139 137 138 140 138  K 3.0*  --  3.4* 3.4* 3.3* 3.7 3.5 3.4*  CL 100  --  105 102 100 102 102 102  CO2 26  --  25 27 25 23 25 25   GLUCOSE 102*  --  99 85 76 91 83 74  BUN 59*  --  30* 44* 51* 60* 67* 71*  CREATININE 5.23*  --  3.55* 4.55* 4.99* 5.26* 5.09* 5.29*  CALCIUM 8.0* 8.0* 7.7* 8.1* 8.1* 8.3* 8.5* 8.2*  PHOS 3.9  --  2.6 3.2 3.8 3.5 3.8 4.2   CBC Recent Labs  Lab 01/31/18 0747 02/03/18 0513 02/04/18 0433 02/05/18 0555  WBC 12.3* 11.0* 11.4* 12.3*  NEUTROABS  --  9.1* 9.2* 10.1*  HGB 7.6* 8.1* 8.8* 9.0*  HCT 25.0* 26.2* 27.2* 28.7*  MCV 74.0* 73.0* 74.1* 74.7*  PLT 316 328 350 386   Medications:    . sodium chloride   Intravenous Once  . amiodarone  200 mg Oral BID  . Chlorhexidine Gluconate Cloth  6 each Topical Q0600  . feeding supplement (NEPRO CARB STEADY)  237 mL Oral TID  BM  . furosemide  160 mg Oral BID  . Gerhardt's butt cream   Topical TID  . heparin  5,000 Units Subcutaneous Q8H  . hydrocortisone   Rectal BID  . mouth rinse  15 mL Mouth Rinse BID  . midodrine  10 mg Oral TID WC  . mometasone-formoterol  2 puff Inhalation BID  . multivitamin  1 tablet Oral QHS  . Netarsudil-Latanoprost  1 drop Both Eyes QHS  . pantoprazole  40 mg Oral BID  . sodium chloride flush  3 mL Intravenous Q12H   Elmarie Shiley, MD 02/06/2018, 10:20 AM

## 2018-02-06 NOTE — Progress Notes (Signed)
   [facility] will sign off.   Medication Recommendations:   Please note previous recommendations on amiodarone dosing Other recommendations (labs, testing, etc):  NA Follow up as an outpatient:  When discharged call our service.  He should be seen within one month.  Dr. Fransico Him is his primary cardiologist

## 2018-02-06 NOTE — Progress Notes (Signed)
Patient ID: Andrew Guerra, male   DOB: 1940/04/25, 78 y.o.   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  He was Hemoccult positive in the ED.  He was also noted to be in fluid overload and started on Lasix.  His renal function deteriorated and fluid overload was not responding to Lasix and he was eventually started on hemodialysis on 01/16/2018 as per nephrology.  Course complicated by paroxysmal A. fib.  He became confused and had a fall on 01/28/2018.  He was also treated for pneumonia +/- UTI.  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  Mental status is back to baseline.  Ambien held, avoid all sedatives.  MRI of the brain was negative for acute abnormality.    Bilateral pneumonia with acute hypoxic respiratory failure and leukocytosis -Chest x-ray on 01/29/2018 showed bibasilar infiltrates.  Treated with Rocephin and Zithromax for 5 days and discontinued on 02/02/2018.   -Currently still on 3 L oxygen via nasal cannula.  Wean off oxygen as needed.  Incentive spirometry.  Unclear if the patient has  UTI -Urine Culture grew ESBL E Coli.  Symptoms had improved with above antibiotic regimen. E coli was probably colonizer.  Peptic ulcer disease with anemia -EGD on 01/10/2018 showed gastric and duodenal ulcers.  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  On PPI twice daily.  Outpatient follow-up with GI  Acute on chronic anemia -Due to chronic bleeding and iron deficiency -Status post 2 units packed red cells during this admission - Monitor hemoglobin.  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  Started dialysis on 01/16/2018 since his volume overload did not respond to Lasix.  Status post tunnel catheter placement by IR on 01/31/2018.  Patient was restarted on Lasix on 02/02/2018 and Foley catheter was placed as per nephrology recommendations.  Follow strict input and output.  Dialysis on hold for now.  Creatinine worsening, today is 5.29 - Still making only very little urine. -Lasix is being switched to torsemide by nephrology.  Nephrology is okay for Foley catheter to be discontinued.  Hypokalemia -Replace.  Repeat a.m. labs  Hypomagnesemia -replace.  Repeat a.m. labs.  Episodes of SVT/history of PAF -CHA2DS2-VASc Score 3 -Cardiology has already evaluated the patient and recommended amiodarone for 100 mg twice a day for 1 week starting 01/22/2018 followed by 200 mg twice a day for 2 weeks and then 200 mg once a day thereafter, to start on 02/11/2018.  Continue Coreg 3.125 mg p.o. twice daily. -Not on anticoagulation because of PUD and bleeding.  Will  need to reconsider anticoagulation once ulcers have healed  Question of hemochromatosis -Noted on MRI on 12/09/2017 to have iron deposits and spleen and liver.  Patient is scheduled to see hematology as an outpatient in 03/2018.  Case was discussed with Andrew Guerra on 01/30/2018: Hemochromatosis PCR was obtained on 01/30/2018 which  can be followed up as an outpatient.  Moderate aortic stenosis due to bicuspid aortic valve -Stable per cardiology.  Outpatient follow-up  NSCLC of right upper lobe -Treated in 2017 at Andrew [facility] with right upper lobe lobectomy: Had right lung recurrence which was treated with radiation -Currently in remission: Follows up every 6 months at [facility]  Moderate malnutrition -Follow nutrition recommendations   DVT prophylaxis: Heparin subcutaneous Code Status: Full Family Communication: None at bedside Disposition Plan: Home once cleared by nephrology   Consultants: Cardiology/GI/nephrology/IR  Procedures:   EGD 12/24 LA Class A reflux esophagitis. - Non-bleeding gastric ulcers with no stigmata of bleeding. Biopsied. - Recent diagnosis of intestinal metaplasia and H pylori. Biopsies obtained for mapping and to document response to therapy, respectively. - Multiple non-bleeding duodenal ulcers with no stigmata of bleeding.  TTE 01/13/18 Impressions: EF 60 to 65% without regional wall motion normalities, - Compared to the exam from 10/24/17, the degree of right ventricular dilation has increased, and right ventricular systolic function has decreased. The degree of leftward septal shift and D shaped septum has worsened. There is now a trivial-small pericardial effusion. Side by side comparison of images performed  Antimicrobials:  Rocephin and Zithromax from 01/29/2018-02/02/2018  Subjective: Patient seen and examined at bedside.  Sleepy, hardly wakes up on calling his name.  No overnight fever, vomiting or worsening shortness of breath reported.  Objective: Vitals:   02/05/18 0756 02/05/18 2104 02/06/18 0554 02/06/18 0910  BP:  (!) 135/59 (!) 152/68 (!) 150/66  Pulse:  68 68 66  Resp:  16 16   Temp:  97.9 F (36.6 C)    TempSrc:  Oral    SpO2: 100% 99% 97% 100%  Weight:      Height:        Intake/Output Summary (Last 24 hours) at 02/06/2018 1125 Last data filed at  02/06/2018 0923 Gross per 24 hour  Intake 1037.5 ml  Output 855 ml  Net 182.5 ml   Filed Weights   02/03/18 0500 02/04/18 0552 02/05/18 0543  Weight: 83.3 kg 85.6 kg 85.6 kg    Examination:  General exam: No acute distress.  Sleepy, hardly wakes up on calling his name.  Respiratory system: Bilateral decreased breath sounds at bases, with some some basilar scattered crackles.  No wheezing cardiovascular system: Rate controlled, S1-S2 heard Gastrointestinal system: Abdomen is nondistended, soft and nontender. Normal bowel sounds heard. Extremities: 1-2+ edema, no cyanosis  Data Reviewed: I have personally reviewed following labs and imaging studies  CBC: Recent Labs  Lab 01/31/18 0747 02/03/18 0513 02/04/18 0433 02/05/18 0555  WBC 12.3* 11.0* 11.4* 12.3*  NEUTROABS  --  9.1* 9.2* 10.1*  HGB 7.6* 8.1* 8.8* 9.0*  HCT 25.0* 26.2* 27.2* 28.7*  MCV 74.0* 73.0* 74.1* 74.7*  PLT 316 328 350 101   Basic Metabolic Panel: Recent Labs  Lab 02/02/18 0646 02/03/18 0513 02/04/18 0433 02/05/18 0555 02/06/18 0457  NA 139 137 138 140 138  K 3.4* 3.3* 3.7 3.5 3.4*  CL 102 100 102 102 102  CO2 27 25 23 25 25   GLUCOSE 85 76 91 83 74  BUN 44* 51* 60* 67* 71*  CREATININE 4.55* 4.99*  5.26* 5.09* 5.29*  CALCIUM 8.1* 8.1* 8.3* 8.5* 8.2*  MG  --  1.7 1.8 1.8 1.5*  PHOS 3.2 3.8 3.5 3.8 4.2   GFR: Estimated Creatinine Clearance: 11.9 mL/min (A) (by C-G formula based on SCr of 5.29 mg/dL (H)). Liver Function Tests: Recent Labs  Lab 02/02/18 0646 02/03/18 0513 02/04/18 0433 02/05/18 0555 02/06/18 0457  ALBUMIN 1.7* 1.7* 1.8* 2.0* 1.8*   No results for input(s): LIPASE, AMYLASE in the last 168 hours. No results for input(s): AMMONIA in the last 168 hours. Coagulation Profile: No results for input(s): INR, PROTIME in the last 168 hours. Cardiac Enzymes: No results for input(s): CKTOTAL, CKMB, CKMBINDEX, TROPONINI in the last 168 hours. BNP (last 3 results) No results for  input(s): PROBNP in the last 8760 hours. HbA1C: No results for input(s): HGBA1C in the last 72 hours. CBG: No results for input(s): GLUCAP in the last 168 hours. Lipid Profile: No results for input(s): CHOL, HDL, LDLCALC, TRIG, CHOLHDL, LDLDIRECT in the last 72 hours. Thyroid Function Tests: No results for input(s): TSH, T4TOTAL, FREET4, T3FREE, THYROIDAB in the last 72 hours. Anemia Panel: No results for input(s): VITAMINB12, FOLATE, FERRITIN, TIBC, IRON, RETICCTPCT in the last 72 hours. Sepsis Labs: No results for input(s): PROCALCITON, LATICACIDVEN in the last 168 hours.  Recent Results (from the past 240 hour(s))  Culture, Urine     Status: Abnormal   Collection Time: 01/30/18  7:59 AM  Result Value Ref Range Status   Specimen Description URINE, RANDOM  Final   Special Requests   Final    NONE Performed at [facility], [address] [place], [place] [address]    Culture (A)  Final    >=100,000 COLONIES/mL ESCHERICHIA COLI Confirmed Extended Spectrum Beta-Lactamase Producer (ESBL).  In bloodstream infections from ESBL organisms, carbapenems are preferred over piperacillin/tazobactam. They are shown to have a lower risk of mortality.    Report Status 02/01/2018 FINAL  Final   Organism ID, Bacteria ESCHERICHIA COLI (A)  Final      Susceptibility   Escherichia coli - MIC*    AMPICILLIN >=32 RESISTANT Resistant     CEFAZOLIN >=64 RESISTANT Resistant     CEFTRIAXONE RESISTANT Resistant     CIPROFLOXACIN >=4 RESISTANT Resistant     GENTAMICIN <=1 SENSITIVE Sensitive     IMIPENEM <=0.25 SENSITIVE Sensitive     NITROFURANTOIN <=16 SENSITIVE Sensitive     TRIMETH/SULFA >=320 RESISTANT Resistant     AMPICILLIN/SULBACTAM >=32 RESISTANT Resistant     PIP/TAZO 8 SENSITIVE Sensitive     Extended ESBL POSITIVE Resistant     * >=100,000 COLONIES/mL ESCHERICHIA COLI         Radiology Studies: No results found.      Scheduled Meds: . sodium chloride   Intravenous  Once  . amiodarone  200 mg Oral BID  . Chlorhexidine Gluconate Cloth  6 each Topical Q0600  . feeding supplement (NEPRO CARB STEADY)  237 mL Oral TID BM  . Gerhardt's butt cream   Topical TID  . heparin  5,000 Units Subcutaneous Q8H  . hydrocortisone   Rectal BID  . mouth rinse  15 mL Mouth Rinse BID  . midodrine  10 mg Oral TID WC  . mometasone-formoterol  2 puff Inhalation BID  . multivitamin  1 tablet Oral QHS  . Netarsudil-Latanoprost  1 drop Both Eyes QHS  . pantoprazole  40 mg Oral BID  . sodium chloride flush  3 mL Intravenous Q12H  . [START  ON 02/07/2018] torsemide  100 mg Oral Daily   Continuous Infusions: . sodium chloride 10 mL/hr at 02/02/18 0100     LOS: 28 days        Aline August, MD [facility] Pager [phone]  If 7PM-7AM, please contact night-coverage [email or web address] Password [facility] 02/06/2018, 11:25 AM

## 2018-02-06 NOTE — Progress Notes (Addendum)
Nutrition Follow-up  DOCUMENTATION CODES:   Non-severe (moderate) malnutrition in context of chronic illness  INTERVENTION:   - Continue Nepro TID (each provides 425 kcal and 19 g protein) - Continue PM snack daily - Continue Rena-vit, magnesium, and potassium supplementation as ordered   NUTRITION DIAGNOSIS:   Moderate Malnutrition related to chronic illness(PUD, CHF, NSCLC of right lung) as evidenced by mild fat depletion, moderate fat depletion, mild muscle depletion, moderate muscle depletion.  Ongoing  GOAL:   Patient will meet greater than or equal to 90% of their needs  Ongoing  MONITOR:   Diet advancement, Labs, Weight trends, Supplement acceptance, I & O's  ASSESSMENT:   78 year old male who presented to the ED on 12/23 with low hemoglobin. Pt with recent admission for GI bleed related to PUD. PMH significant for CHF, HTN, HLD, NSCLC of right lung.  12/24 - s/p EGD with non-bleeding gastric and duodenal ulcers, gastric biopsiesshowing chronic gastritis and intestinal metaplasia 12/30 - temp catheter placed, first HD treatment 1/2 - s/p R heart cath  Per chart, wt hovering around 82-85 kg since 1/14.  Pt reports continued poor appetite and states part of the problem is the food served in the hospital. Per nsg, pt ate ~25% of breakfast today. Pt received lunch at time of nutrition visit - salad with deli meat. Pt reports drinking about 2 of the Nepro shakes each day.  Last HD on Thurs 1/16 - labs looked good, will see how it goes without HD.  Pt wife requested some education on a renal diet so she can be prepared when they go home. See education note.  Labs: BUN 71 and trending up, Creatinine 5.29 and trending up. Magnesium 1.5, GFR 10%/11%, Hgb 9.0 and trending up. Meds: Nepro TID, Rena-vit, KCl tablet, magnesium sulfate  Diet Order:  No intake documented since 1/12 Diet Order            Diet renal with fluid restriction Fluid restriction: 1200 mL Fluid;  Room service appropriate? Yes; Fluid consistency: Thin  Diet effective now              EDUCATION NEEDS:  Education needs have been addressed  Skin:  Skin Assessment: Skin Integrity Issues: Skin Integrity Issues:: Stage II Stage II: bilateral ears  Last BM:  1/18  Height:  Ht Readings from Last 1 Encounters:  01/09/18 5' 10.5" (1.791 m)    Weight:  Wt Readings from Last 1 Encounters:  02/05/18 85.6 kg    Ideal Body Weight:  76.8 kg  BMI:  Body mass index is 26.69 kg/m.  Estimated Nutritional Needs:   Kcal:  2000-2200  Protein:  100-115 grams  Fluid:  1200 mL  Althea Grimmer, MS, RDN, LDN Pager: [phone] Available Mondays and Fridays, 9am-2pm

## 2018-02-07 ENCOUNTER — Encounter (HOSPITAL_COMMUNITY): Payer: Self-pay | Admitting: Nephrology

## 2018-02-07 LAB — RENAL FUNCTION PANEL
Albumin: 1.8 g/dL — ABNORMAL LOW (ref 3.5–5.0)
Anion gap: 11 (ref 5–15)
BUN: 79 mg/dL — ABNORMAL HIGH (ref 8–23)
CO2: 25 mmol/L (ref 22–32)
Calcium: 8.2 mg/dL — ABNORMAL LOW (ref 8.9–10.3)
Chloride: 103 mmol/L (ref 98–111)
Creatinine, Ser: 5.37 mg/dL — ABNORMAL HIGH (ref 0.61–1.24)
GFR calc Af Amer: 11 mL/min — ABNORMAL LOW (ref >60)
GFR, EST NON AFRICAN AMERICAN: 9 mL/min — AB (ref >60)
Glucose, Bld: 80 mg/dL (ref 70–99)
Phosphorus: 3.9 mg/dL (ref 2.5–4.6)
Potassium: 3.6 mmol/L (ref 3.5–5.1)
Sodium: 139 mmol/L (ref 135–145)

## 2018-02-07 LAB — CBC WITH DIFFERENTIAL/PLATELET
Abs Immature Granulocytes: 0 10*3/uL (ref 0.00–0.07)
Basophils Absolute: 0 10*3/uL (ref 0.0–0.1)
Basophils Relative: 0 %
EOS PCT: 3 %
Eosinophils Absolute: 0.3 10*3/uL (ref 0.0–0.5)
HCT: 25.3 % — ABNORMAL LOW (ref 39.0–52.0)
Hemoglobin: 7.9 g/dL — ABNORMAL LOW (ref 13.0–17.0)
Lymphocytes Relative: 5 %
Lymphs Abs: 0.5 10*3/uL — ABNORMAL LOW (ref 0.7–4.0)
MCH: 23.1 pg — AB (ref 26.0–34.0)
MCHC: 31.2 g/dL (ref 30.0–36.0)
MCV: 74 fL — ABNORMAL LOW (ref 80.0–100.0)
Monocytes Absolute: 0 10*3/uL — ABNORMAL LOW (ref 0.1–1.0)
Monocytes Relative: 0 %
Neutro Abs: 9.3 10*3/uL — ABNORMAL HIGH (ref 1.7–7.7)
Neutrophils Relative %: 92 %
Platelets: 311 10*3/uL (ref 150–400)
RBC: 3.42 MIL/uL — ABNORMAL LOW (ref 4.22–5.81)
RDW: 24.5 % — ABNORMAL HIGH (ref 11.5–15.5)
WBC: 10.1 10*3/uL (ref 4.0–10.5)
nRBC: 0 % (ref 0.0–0.2)
nRBC: 0 /100 WBC

## 2018-02-07 LAB — MAGNESIUM: Magnesium: 1.6 mg/dL — ABNORMAL LOW (ref 1.7–2.4)

## 2018-02-07 NOTE — Care Management Note (Addendum)
Case Management Note  Patient Details  Name: Andrew Guerra MRN: [id number] Date of Birth: 11-25-1940  Subjective/Objective:   Admitted with GI bleed.  Hx of hypertension, hyperlipidemia, bicuspid aortic valve / aortic stenosis, NSCLC status post resection and radiation, hypertension and PUD/duodenal and gastric ulcers. From home with wife. PTA independent with ADL's, no DME usage per pt.     FERNIE GRIMM (Spouse)     [phone]             Hospital course a.fib, PNA, UTI, s/p fall, confusion      PCP: Sallee Lange  Action/Plan: Transition to home with home health services(PT) vs outpatient PT when medically really... Nephrology following ..... If renal function continues to worsen and urine output remains poor, will commit to outpatient dialysis and monitor for recovery.  Expected Discharge Date:                  Expected Discharge Plan:  [place]  In-House Referral:  NA  Discharge planning Services  CM Consult  Post Acute Care Choice:  NA Choice offered to:  Patient  DME Arranged:  Walker rolling with seat DME Agency:  [employer]will deliver to pt's bedside prior to d/c.  HH Arranged:   n/a HH Agency:   n/a  Status of Service:  In process, will continue to follow  If discussed at Long Length of Stay Meetings, dates discussed:    Additional Comments:  Sharin Mons, RN 02/07/2018, 12:07 PM

## 2018-02-07 NOTE — Progress Notes (Signed)
Patient ID: Andrew Guerra, male   DOB: Oct 02, 1940, 78 y.o.   MRN: [id number] [facility] Progress Note   Assessment/ Plan:   1.  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  Creatinine stable versus slightly worse overnight-converting from oral furosemide to torsemide today to try and see if we can improve diuresis/urine output.  If renal function continues to worsen and urine output remains poor, will commit to outpatient dialysis and monitor for recovery. 2.  Hypokalemia: Mild likely secondary to ongoing diuretic use, replaced via oral supplement. 3.  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  Currently appears to be rate controlled on amiodarone.  Not on anticoagulation. 4.  GI bleed with acute blood loss anemia: EGD showing gastric and duodenal ulcers for which he is on PPI without additional overt bleed.  Hemoglobin/hematocrit stable.  Subjective:   Reports to be somewhat better, Foley catheter discontinued yesterday.   Objective:   BP (!) 133/58 (BP Location: Left Arm)   Pulse 64   Temp 97.9 F (36.6 C) (Oral)   Resp 16   Ht 5' 10.5" (1.791 m)   Wt 83.9 kg   SpO2 94%   BMI 26.16 kg/m   Intake/Output Summary (Last 24 hours) at 02/07/2018 0839 Last data filed at 02/07/2018 0559 Gross per 24 hour  Intake -  Output 755 ml  Net -755 ml   Weight change:   Physical Exam: Gen: Comfortably sitting up in bed, wife at bedside. CVS: Pulse regular rhythm, normal rate, S1 and S2 with an ejection systolic murmur Resp: Diminished breath sounds over bases, no distinct rales or rhonchi Abd: Soft, obese, nontender Ext: 2-3+ pitting lower extremity edema  Labs: BMET Recent Labs  Lab  02/01/18 0348 02/02/18 0646 02/03/18 0513 02/04/18 0433 02/05/18 0555 02/06/18 0457 02/07/18 0527  NA 138 139 137 138 140 138 139  K 3.4* 3.4* 3.3* 3.7 3.5 3.4* 3.6  CL 105 102 100 102 102 102 103  CO2 25 27 25 23 25 25 25   GLUCOSE 99 85 76 91 83 74 80  BUN 30* 44* 51* 60* 67* 71* 79*  CREATININE 3.55* 4.55* 4.99* 5.26* 5.09* 5.29* 5.37*  CALCIUM 7.7* 8.1* 8.1* 8.3* 8.5* 8.2* 8.2*  PHOS 2.6 3.2 3.8 3.5 3.8 4.2 3.9   CBC Recent Labs  Lab 02/03/18 0513 02/04/18 0433 02/05/18 0555 02/07/18 0527  WBC 11.0* 11.4* 12.3* 10.1  NEUTROABS 9.1* 9.2* 10.1* 9.3*  HGB 8.1* 8.8* 9.0* 7.9*  HCT 26.2* 27.2* 28.7* 25.3*  MCV 73.0* 74.1* 74.7* 74.0*  PLT 328 350 386 311   Medications:    . sodium chloride   Intravenous Once  . amiodarone  200 mg Oral BID  . Chlorhexidine Gluconate Cloth  6 each Topical Q0600  . feeding supplement (NEPRO CARB STEADY)  237 mL Oral TID BM  . Gerhardt's butt cream   Topical TID  . heparin  5,000 Units Subcutaneous Q8H  . hydrocortisone   Rectal BID  . mouth rinse  15 mL Mouth Rinse BID  . midodrine  10 mg Oral TID WC  . mometasone-formoterol  2 puff Inhalation BID  . multivitamin  1 tablet Oral QHS  . Netarsudil-Latanoprost  1 drop Both Eyes QHS  . pantoprazole  40 mg  Oral BID  . sodium chloride flush  3 mL Intravenous Q12H  . torsemide  100 mg Oral Daily   Elmarie Shiley, MD 02/07/2018, 8:39 AM

## 2018-02-07 NOTE — Progress Notes (Signed)
CSW and RNCM spoke with patient and his wife regarding discharge plan for outpatient therapy versus home health. RNCM will follow up with their final decision.   Percell Locus Madisin Hasan LCSW [phone]

## 2018-02-07 NOTE — Progress Notes (Signed)
Physical Therapy Treatment Patient Details Name: Andrew Guerra MRN: [id number] DOB: [birth year] Today's Date: 02/07/2018    History of Present Illness Wright 'Tito Ausmus is a 78yo male who comes to [facility] on 01/09/18 after noted Hb: 6.5 at his PCP's office. Pt has subsequent kidney failure while admitted requiring HD, now with tunneled EJ cath (1/14). Pt also has kidney biopsy on 1/8, cardia catheterization, and a fall on 1/11 (ambien related per RN). Pt now being treated for PNA.     PT Comments    Pt maintained 89/90% on 3L [place]ra[place] during ambulation.  No overt dyspnea noted.   Follow Up Recommendations  Outpatient PT;Supervision for mobility/OOB     Equipment Recommendations  Rolling walker with 5" wheels    Recommendations for Other Services       Precautions / Restrictions Precautions Precautions: Fall    Mobility  Bed Mobility               General bed mobility comments: pt OOB in recliner chair upon arrival  Transfers Overall transfer level: Needs assistance Equipment used: Rolling walker (2 wheeled) Transfers: Sit to/from Stand Sit to Stand: Supervision         General transfer comment: supervision for safety  Ambulation/Gait Ambulation/Gait assistance: Min guard Gait Distance (Feet): 230 Feet Assistive device: Rolling walker (2 wheeled) Gait Pattern/deviations: Step-to pattern     General Gait Details: generally steady with episodes of instability and drifting right especially when rushing too much.  89/90% on 3L   during ambulation and slow recovery to 94%   Stairs             Wheelchair Mobility    Modified Rankin (Stroke Patients Only)       Balance Overall balance assessment: Needs assistance Sitting-balance support: Feet supported Sitting balance-Leahy Scale: Good     Standing balance support: Bilateral upper extremity supported;During functional activity Standing balance-Leahy Scale: Fair Standing balance comment: prefers use of  the rW                            Cognition Arousal/Alertness: Awake/alert Behavior During Therapy: WFL for tasks assessed/performed Overall Cognitive Status: Within Functional Limits for tasks assessed                                        Exercises      General Comments        Pertinent Vitals/Pain Pain Assessment: Faces Faces Pain Scale: No hurt    Home Living                      Prior Function            PT Goals (current goals can now be found in the care plan section) Acute Rehab PT Goals Patient Stated Goal: regain strength  PT Goal Formulation: With patient Time For Goal Achievement: 02/08/18 Potential to Achieve Goals: Good Progress towards PT goals: Progressing toward goals    Frequency    Min 3X/week      PT Plan Current plan remains appropriate    Co-evaluation              AM-PAC PT "6 Clicks" Mobility   Outcome Measure  Help needed turning from your back to your side while in a flat bed without using bedrails?: None Help needed  moving from lying on your back to sitting on the side of a flat bed without using bedrails?: None Help needed moving to and from a bed to a chair (including a wheelchair)?: A Little Help needed standing up from a chair using your arms (e.g., wheelchair or bedside chair)?: A Little Help needed to walk in hospital room?: A Little Help needed climbing 3-5 steps with a railing? : A Little 6 Click Score: 20    End of Session   Activity Tolerance: Patient tolerated treatment well Patient left: in chair;with call bell/phone within reach;with family/visitor present Nurse Communication: Mobility status PT Visit Diagnosis: Other abnormalities of gait and mobility (R26.89);Muscle weakness (generalized) (M62.81)     Time: 8550-1586 PT Time Calculation (min) (ACUTE ONLY): 22 min  Charges:  $Gait Training: 8-22 mins                     02/07/2018  Donnella Sham, PT Acute  Rehabilitation Services [phone]  (pager) [phone]  (office)   Tessie Fass Roniqua Kintz 02/07/2018, 1:53 PM

## 2018-02-07 NOTE — Progress Notes (Signed)
Patient ID: Andrew Guerra, male   DOB: 1940-07-24, 78 y.o.   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  He was Hemoccult positive in the ED.  He was also noted to be in fluid overload and started on Lasix.  His renal function deteriorated and fluid overload was not responding to Lasix and he was eventually started on hemodialysis on 01/16/2018 as per nephrology.  Course complicated by paroxysmal A. fib.  He became confused and had a fall on 01/28/2018.  He was also treated for pneumonia +/- UTI.  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  Started dialysis on 01/16/2018 since his volume overload did not respond to Lasix.  Status post tunnel catheter placement by IR on 01/31/2018.  Patient was restarted on Lasix on 02/02/2018 and Foley catheter was placed as per nephrology recommendations.  Follow strict input and output.  Dialysis on hold for now.  Creatinine worsening, today is 5.37 - Still making only little urine. -Lasix was switched to oral torsemide by nephrology on  02/06/2018.  Foley catheter was removed on 02/06/2018.  Follow nephrology recommendations.  Repeat a.m. labs  Delirium and fall at night of 01/28/2018 -Monitor mental status.  Mental status is back to baseline.  Ambien held, avoid all sedatives.  MRI of the brain was negative for acute abnormality.    Bilateral pneumonia with acute hypoxic respiratory failure and leukocytosis -Chest x-ray on 01/29/2018 showed bibasilar infiltrates.  Treated with Rocephin and Zithromax for 5 days and discontinued on 02/02/2018.   -Currently still on 3 L oxygen via nasal cannula.  Wean off oxygen as needed.  Incentive spirometry.  Unclear if the patient has  UTI -Urine Culture grew ESBL E Coli. Symptoms had improved with above antibiotic regimen. E coli was probably colonizer.  Peptic ulcer disease with anemia -EGD on 01/10/2018 showed gastric and duodenal ulcers.  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  On PPI twice daily.  Outpatient follow-up with GI  Acute on chronic anemia -Due to chronic bleeding and iron deficiency -Status post 2 units packed red cells during this admission - Monitor hemoglobin.  Hemoglobin is 9.3 today.  Hypokalemia -Improved.  Hypomagnesemia -replace.  Repeat a.m. labs.  Episodes of SVT/history of PAF -CHA2DS2-VASc Score 3 -Cardiology has already evaluated the patient and signed off.  Continue amiodarone as per cardiology recommendations.  We will switch to 200 mg once a day from 02/11/2018.  Continue Coreg 3.125 mg p.o. twice daily. -Not on anticoagulation because of PUD and bleeding.  Will need to reconsider anticoagulation once ulcers have healed  Question of hemochromatosis -  Noted on MRI on 12/09/2017 to have iron deposits and spleen and liver.  Patient is scheduled to see hematology as an outpatient in 03/2018.  Case was discussed with Dr. Learta Codding on 01/30/2018: Hemochromatosis PCR was obtained on 01/30/2018 which can be followed up as an  outpatient.  Moderate aortic stenosis due to bicuspid aortic valve -Stable per cardiology.  Outpatient follow-up  NSCLC of right upper lobe -Treated in 2017 at [facility] with right upper lobe lobectomy: Had right lung recurrence which was treated with radiation -Currently in remission: Follows up every 6 months at [facility]  Moderate malnutrition -Follow nutrition recommendations   DVT prophylaxis: Heparin subcutaneous Code Status: Full Family Communication: Wife at bedside Disposition Plan: Home once cleared by nephrology   Consultants: Cardiology/GI/nephrology/IR  Procedures:   EGD 12/24 LA Class A reflux esophagitis. - Non-bleeding gastric ulcers with no stigmata of bleeding. Biopsied. - Recent diagnosis of intestinal metaplasia and H pylori. Biopsies obtained for mapping and to document response to therapy, respectively. - Multiple non-bleeding duodenal ulcers with no stigmata of bleeding.  TTE 01/13/18 Impressions: EF 60 to 65% without regional wall motion normalities, - Compared to the exam from 10/24/17, the degree of right ventricular dilation has increased, and right ventricular systolic function has decreased. The degree of leftward septal shift and D shaped septum has worsened. There is now a trivial-small pericardial effusion. Side by side comparison of images performed  Antimicrobials:  Rocephin and Zithromax from 01/29/2018-02/02/2018  Subjective: Patient seen and examined at bedside.  No overnight fever, nausea or vomiting.  No worsening shortness of breath.  Wife present at bedside  objective: Vitals:   02/07/18 0500 02/07/18 0546 02/07/18 0804 02/07/18 0937  BP:  (!) 133/58  (!) 145/64  Pulse:  64  67  Resp:      Temp:  97.9 F (36.6 C)  97.7 F (36.5 C)  TempSrc:  Oral  Oral  SpO2:  92% 94% 100%  Weight: 83.9 kg     Height:        Intake/Output Summary (Last 24 hours) at 02/07/2018 1005 Last data filed at 02/07/2018 0948 Gross per 24 hour   Intake -  Output 720 ml  Net -720 ml   Filed Weights   02/04/18 0552 02/05/18 0543 02/07/18 0500  Weight: 85.6 kg 85.6 kg 83.9 kg    Examination:  General exam: No distress.   Respiratory system: Bilateral decreased breath sounds at bases, with some scattered crackles  cardiovascular system: S1-S2 heard, rate controlled  gastrointestinal system: Abdomen is nondistended, soft and nontender. Normal bowel sounds heard. Extremities: 2+ edema, no cyanosis  Data Reviewed: I have personally reviewed following labs and imaging studies  CBC: Recent Labs  Lab 02/03/18 0513 02/04/18 0433 02/05/18 0555 02/07/18 0527  WBC 11.0* 11.4* 12.3* 10.1  NEUTROABS 9.1* 9.2* 10.1* 9.3*  HGB 8.1* 8.8* 9.0* 7.9*  HCT 26.2* 27.2* 28.7* 25.3*  MCV 73.0* 74.1* 74.7* 74.0*  PLT 328 350 386 675   Basic Metabolic Panel: Recent Labs  Lab 02/03/18 0513 02/04/18 0433 02/05/18 0555 02/06/18 0457 02/07/18 0527  NA 137 138 140 138 139  K 3.3* 3.7 3.5 3.4* 3.6  CL 100 102 102 102 103  CO2 25 23 25 25 25   GLUCOSE 76 91 83 74 80  BUN 51* 60* 67* 71* 79*  CREATININE 4.99* 5.26* 5.09* 5.29* 5.37*  CALCIUM 8.1* 8.3* 8.5* 8.2* 8.2*  MG 1.7 1.8 1.8 1.5* 1.6*  PHOS 3.8 3.5 3.8 4.2 3.9  GFR: Estimated Creatinine Clearance: 11.7 mL/min (A) (by C-G formula based on SCr of 5.37 mg/dL (H)). Liver Function Tests: Recent Labs  Lab 02/03/18 0513 02/04/18 0433 02/05/18 0555 02/06/18 0457 02/07/18 0527  ALBUMIN 1.7* 1.8* 2.0* 1.8* 1.8*   No results for input(s): LIPASE, AMYLASE in the last 168 hours. No results for input(s): AMMONIA in the last 168 hours. Coagulation Profile: No results for input(s): INR, PROTIME in the last 168 hours. Cardiac Enzymes: No results for input(s): CKTOTAL, CKMB, CKMBINDEX, TROPONINI in the last 168 hours. BNP (last 3 results) No results for input(s): PROBNP in the last 8760 hours. HbA1C: No results for input(s): HGBA1C in the last 72 hours. CBG: No results for  input(s): GLUCAP in the last 168 hours. Lipid Profile: No results for input(s): CHOL, HDL, LDLCALC, TRIG, CHOLHDL, LDLDIRECT in the last 72 hours. Thyroid Function Tests: No results for input(s): TSH, T4TOTAL, FREET4, T3FREE, THYROIDAB in the last 72 hours. Anemia Panel: No results for input(s): VITAMINB12, FOLATE, FERRITIN, TIBC, IRON, RETICCTPCT in the last 72 hours. Sepsis Labs: No results for input(s): PROCALCITON, LATICACIDVEN in the last 168 hours.  Recent Results (from the past 240 hour(s))  Culture, Urine     Status: Abnormal   Collection Time: 01/30/18  7:59 AM  Result Value Ref Range Status   Specimen Description URINE, RANDOM  Final   Special Requests   Final    NONE Performed at [facility], [address] [place], [place] [address]    Culture (A)  Final    >=100,000 COLONIES/mL ESCHERICHIA COLI Confirmed Extended Spectrum Beta-Lactamase Producer (ESBL).  In bloodstream infections from ESBL organisms, carbapenems are preferred over piperacillin/tazobactam. They are shown to have a lower risk of mortality.    Report Status 02/01/2018 FINAL  Final   Organism ID, Bacteria ESCHERICHIA COLI (A)  Final      Susceptibility   Escherichia coli - MIC*    AMPICILLIN >=32 RESISTANT Resistant     CEFAZOLIN >=64 RESISTANT Resistant     CEFTRIAXONE RESISTANT Resistant     CIPROFLOXACIN >=4 RESISTANT Resistant     GENTAMICIN <=1 SENSITIVE Sensitive     IMIPENEM <=0.25 SENSITIVE Sensitive     NITROFURANTOIN <=16 SENSITIVE Sensitive     TRIMETH/SULFA >=320 RESISTANT Resistant     AMPICILLIN/SULBACTAM >=32 RESISTANT Resistant     PIP/TAZO 8 SENSITIVE Sensitive     Extended ESBL POSITIVE Resistant     * >=100,000 COLONIES/mL ESCHERICHIA COLI         Radiology Studies: No results found.      Scheduled Meds: . sodium chloride   Intravenous Once  . amiodarone  200 mg Oral BID  . Chlorhexidine Gluconate Cloth  6 each Topical Q0600  . feeding supplement (NEPRO CARB  STEADY)  237 mL Oral TID BM  . Gerhardt's butt cream   Topical TID  . heparin  5,000 Units Subcutaneous Q8H  . hydrocortisone   Rectal BID  . mouth rinse  15 mL Mouth Rinse BID  . midodrine  10 mg Oral TID WC  . mometasone-formoterol  2 puff Inhalation BID  . multivitamin  1 tablet Oral QHS  . Netarsudil-Latanoprost  1 drop Both Eyes QHS  . pantoprazole  40 mg Oral BID  . sodium chloride flush  3 mL Intravenous Q12H  . torsemide  100 mg Oral Daily   Continuous Infusions: . sodium chloride 10 mL/hr at 02/02/18 0100     LOS: 29 days  Aline August, MD [facility] Pager [phone]  If 7PM-7AM, please contact night-coverage [email or web address] Password [email or web address] 02/07/2018, 10:05 AM

## 2018-02-08 LAB — CBC WITH DIFFERENTIAL/PLATELET
Band Neutrophils: 0 %
Basophils Absolute: 0 10*3/uL (ref 0.0–0.1)
Basophils Relative: 0 %
Blasts: 0 %
EOS PCT: 2 %
Eosinophils Absolute: 0.2 10*3/uL (ref 0.0–0.5)
HCT: 27 % — ABNORMAL LOW (ref 39.0–52.0)
HEMOGLOBIN: 8.4 g/dL — AB (ref 13.0–17.0)
LYMPHS ABS: 1.1 10*3/uL (ref 0.7–4.0)
Lymphocytes Relative: 10 %
MCH: 23.1 pg — ABNORMAL LOW (ref 26.0–34.0)
MCHC: 31.1 g/dL (ref 30.0–36.0)
MCV: 74.2 fL — AB (ref 80.0–100.0)
Metamyelocytes Relative: 0 %
Monocytes Absolute: 0.8 10*3/uL (ref 0.1–1.0)
Monocytes Relative: 8 %
Myelocytes: 0 %
Neutro Abs: 8.5 10*3/uL — ABNORMAL HIGH (ref 1.7–7.7)
Neutrophils Relative %: 80 %
Other: 0 %
Platelets: 326 10*3/uL (ref 150–400)
Promyelocytes Relative: 0 %
RBC: 3.64 MIL/uL — ABNORMAL LOW (ref 4.22–5.81)
RDW: 24.2 % — ABNORMAL HIGH (ref 11.5–15.5)
WBC: 10.6 10*3/uL — ABNORMAL HIGH (ref 4.0–10.5)
nRBC: 0 % (ref 0.0–0.2)
nRBC: 0 /100 WBC

## 2018-02-08 LAB — RENAL FUNCTION PANEL
Albumin: 1.9 g/dL — ABNORMAL LOW (ref 3.5–5.0)
Anion gap: 11 (ref 5–15)
BUN: 86 mg/dL — ABNORMAL HIGH (ref 8–23)
CALCIUM: 8.5 mg/dL — AB (ref 8.9–10.3)
CO2: 26 mmol/L (ref 22–32)
CREATININE: 5.16 mg/dL — AB (ref 0.61–1.24)
Chloride: 103 mmol/L (ref 98–111)
GFR calc Af Amer: 11 mL/min — ABNORMAL LOW (ref >60)
GFR calc non Af Amer: 10 mL/min — ABNORMAL LOW (ref >60)
Glucose, Bld: 92 mg/dL (ref 70–99)
Phosphorus: 3.8 mg/dL (ref 2.5–4.6)
Potassium: 3.7 mmol/L (ref 3.5–5.1)
Sodium: 140 mmol/L (ref 135–145)

## 2018-02-08 LAB — MAGNESIUM: Magnesium: 1.6 mg/dL — ABNORMAL LOW (ref 1.7–2.4)

## 2018-02-08 MED ORDER — NONFORMULARY OR COMPOUNDED ITEM: 0 refills | Status: DC

## 2018-02-08 NOTE — Progress Notes (Addendum)
Patient ID: Andrew Guerra, male   DOB: [birth year], 78 y.o.   MRN: [id number] [facility] Progress Note   Assessment/ Plan:   1.  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  Creatinine slightly better overnight with marginal urine output; he does not have any acute indications for dialysis at this time.  If he continues to show improvement of his creatinine, would favor discharge home tomorrow with weekly labs via my office and follow-up with me on 03/09/2018 at 3:30 PM.  I would recommend discharging him home with his tunneled hemodialysis catheter with the plan to have this discontinued as an outpatient if he does not require dialysis imminently. 2.  Hypokalemia: Mild likely secondary to ongoing diuretic use, directed with oral supplementation. 3.  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  Currently appears to be rate controlled on amiodarone.  Not on anticoagulation. 4.  GI bleed with acute blood loss anemia: EGD showing gastric and duodenal ulcers for which he is on PPI without additional overt bleed.  Hemoglobin/hematocrit stable.  Subjective:   Reports to be feeling fair, continues to have some shortness of breath with exertion and requires oxygen supplementation.   Objective:   BP 138/67 (BP Location: Right Arm)   Pulse 70   Temp (!) 97.4 F (36.3 C)   Resp 16   Ht 5' 10.5" (1.791 m)   Wt 83.9 kg   SpO2 100%   BMI 26.16 kg/m   Intake/Output Summary (Last 24 hours) at 02/08/2018 0952 Last data filed at 02/08/2018 0519 Gross per 24 hour  Intake -  Output 740 ml  Net -740 ml   Weight change:   Physical Exam: Gen: Comfortably sitting up in bed, on oxygen via nasal cannula  (SPO2 97%). CVS: Pulse regular rhythm, normal rate, S1 and S2 with an ejection systolic murmur Resp: Clear to auscultation bilaterally, no rales/rhonchi Abd: Soft, obese, nontender Ext: 2-3+ pitting lower extremity edema  Labs: BMET Recent Labs  Lab 02/02/18 0646 02/03/18 0513 02/04/18 0433 02/05/18 0555 02/06/18 0457 02/07/18 0527 02/08/18 0457  NA 139 137 138 140 138 139 140  K 3.4* 3.3* 3.7 3.5 3.4* 3.6 3.7  CL 102 100 102 102 102 103 103  CO2 27 25 23 25 25 25 26   GLUCOSE 85 76 91 83 74 80 92  BUN 44* 51* 60* 67* 71* 79* 86*  CREATININE 4.55* 4.99* 5.26* 5.09* 5.29* 5.37* 5.16*  CALCIUM 8.1* 8.1* 8.3* 8.5* 8.2* 8.2* 8.5*  PHOS 3.2 3.8 3.5 3.8 4.2 3.9 3.8   CBC Recent Labs  Lab 02/04/18 0433 02/05/18 0555 02/07/18 0527 02/08/18 0457  WBC 11.4* 12.3* 10.1 10.6*  NEUTROABS 9.2* 10.1* 9.3* 8.5*  HGB 8.8* 9.0* 7.9* 8.4*  HCT 27.2* 28.7* 25.3* 27.0*  MCV 74.1* 74.7* 74.0* 74.2*  PLT 350 386 311 326   Medications:    . sodium chloride   Intravenous Once  . amiodarone  200 mg Oral BID  . Chlorhexidine Gluconate Cloth  6 each Topical Q0600  . feeding supplement (NEPRO CARB STEADY)  237 mL Oral TID BM  . Gerhardt's butt cream   Topical TID  . heparin  5,000 Units Subcutaneous Q8H  . hydrocortisone   Rectal BID  . mouth rinse  15  mL Mouth Rinse BID  . midodrine  10 mg Oral TID WC  . mometasone-formoterol  2 puff Inhalation BID  . multivitamin  1 tablet Oral QHS  . Netarsudil-Latanoprost  1 drop Both Eyes QHS  . pantoprazole  40 mg Oral BID  . sodium chloride flush  3 mL Intravenous Q12H  . torsemide  100 mg Oral Daily   Elmarie Shiley, MD 02/08/2018, 9:52 AM

## 2018-02-08 NOTE — Progress Notes (Addendum)
PROGRESS NOTE        PATIENT DETAILS Name: Andrew Guerra Age: 78 y.o. Sex: male Date of Birth: [birth year] Admit Date: 01/09/2018 Admitting Physician [name], DO QVZ:DGLOVF, Elayne Snare, MD  Brief Narrative: Patient is a 78 y.o. 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  He underwent EGD which showed gastric/duodenal ulcers.  Hospital course was prolonged as patient developed likely secondary to ATN-briefly required hemodialysis.  Renal function has improved-responding to [facility] below for further details.  Subjective: Leg swelling has improved-no chest pain or shortness of breath.  No hematochezia or melena.  Assessment/Plan: AKI on CKD stage IV: AKI likely hemodynamically mediated-probably ATN-in the setting of GI bleed with acute blood loss anemia and A. fib with RVR.  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  Briefly required HD during the early part of his hospital stay-renal function slowly improving.  Nephrology following-with recommendations to observe overnight-and if he continues to improve-home on 1/23.  Nephrology has already arranged for outpatient follow-up.  Nephrology recommending that the patient be discharged with tunneled dialysis catheter in case he requires hemodialysis in the outpatient setting.  Acute on chronic diastolic heart failure: Volume status has improved after HD and now with Demadex.  GI bleeding with acute on chronic blood loss anemia: GI bleeding has resolved-underwent EGD on 12/24 which showed gastric/duodenal ulcers (nonbleeding).  Hemoglobin stable-did require 2 units of PRBC during this hospital stay.  Hemoglobin currently stable.  Continue PPI.  Paroxysmal A.  fib with IEP:PIRJJOAC Coreg-and amiodarone-switch amiodarone to once daily dosing from 1/25.  Not a anticoagulation candidate due to recent GI bleeding.CHA2DS2-VASc Score 3  Bilateral pneumonia with acute hypoxic respiratory failure: Improved-continue incentive spirometry-attempt to wean off oxygen.  Completed a course of Rocephin/Zithromax.  May require home O2 have asked nursing staff to wean off oxygen-if not able to wean off-have asked them to perform a ambulatory O2 saturation.  Asymptomatic bacteriuria: Urine culture with positive ESBL E. coli-likely a colonization.  Delirium: Seems to have resolved-continue to avoid all sedatives-MRI brain was negative for acute abnormality.  He is completely awake and alert this morning  Bicuspid aortic valve with moderate aortic stenosis: Stable for outpatient follow-up by cardiology.  NSCLC: Treated with right upper lobectomy at [facility]-subsequently required radiation.  Follows up every 6 months at the oncology center at [facility].  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  But no mutation seen on hemochromatosis DNA-PCR.   Hematology appointment upcoming.  Moderate malnutrition: Continue supplements  DVT Prophylaxis: Prophylactic Heparin   Code Status: Full code   Family Communication: Spouse at bedside  Disposition Plan: Remain inpatient  Antimicrobial agents: Anti-infectives (From admission, onward)   Start     Dose/Rate Route Frequency Ordered Stop   02/01/18 1400  cefTRIAXone (ROCEPHIN) 1 g in sodium chloride 0.9 % 100 mL IVPB     1 g 200 mL/hr over 30 Minutes Intravenous Every 24 hours 02/01/18 1020 02/02/18 1508   01/31/18 1612  ceFAZolin (ANCEF) powder       As needed 01/31/18 1612 01/31/18 1612  01/31/18 1550  ceFAZolin (ANCEF) 2-4 GM/100ML-% IVPB    Note to Pharmacy:  Gar Ponto   : cabinet override      01/31/18 1550 02/01/18 0359   01/31/18 1530  ceFAZolin (ANCEF) IVPB 2g/100 mL  premix  Status:  Discontinued     2 g 200 mL/hr over 30 Minutes Intravenous  Once 01/31/18 1529 02/01/18 1020   01/30/18 1000  azithromycin (ZITHROMAX) tablet 250 mg     250 mg Oral Daily 01/29/18 1305 02/02/18 0916   01/30/18 0630  ceFAZolin (ANCEF) IVPB 2g/100 mL premix     2 g 200 mL/hr over 30 Minutes Intravenous To Radiology 01/30/18 0621 01/31/18 0630   01/29/18 1400  cefTRIAXone (ROCEPHIN) 1 g in sodium chloride 0.9 % 100 mL IVPB  Status:  Discontinued     1 g 200 mL/hr over 30 Minutes Intravenous Every 24 hours 01/29/18 1305 02/01/18 1020   01/29/18 1400  azithromycin (ZITHROMAX) tablet 500 mg     500 mg Oral Daily 01/29/18 1305 01/29/18 2242   01/16/18 1230  ceFAZolin (ANCEF) IVPB 2g/100 mL premix    Note to Pharmacy:  Pre IR procedure, procedure time 12/30 not yet known   2 g 200 mL/hr over 30 Minutes Intravenous To Surgery 01/15/18 1112 01/17/18 1230      Procedures: 12/24>>EGD 1/8>> kidney biopsy  CONSULTS: GI Cards Renal  Time spent: 25- minutes-Greater than 50% of this time was spent in counseling, explanation of diagnosis, planning of further management, and coordination of care.  MEDICATIONS: Scheduled Meds: . sodium chloride   Intravenous Once  . amiodarone  200 mg Oral BID  . Chlorhexidine Gluconate Cloth  6 each Topical Q0600  . feeding supplement (NEPRO CARB STEADY)  237 mL Oral TID BM  . Gerhardt's butt cream   Topical TID  . heparin  5,000 Units Subcutaneous Q8H  . hydrocortisone   Rectal BID  . mouth rinse  15 mL Mouth Rinse BID  . midodrine  10 mg Oral TID WC  . mometasone-formoterol  2 puff Inhalation BID  . multivitamin  1 tablet Oral QHS  . Netarsudil-Latanoprost  1 drop Both Eyes QHS  . pantoprazole  40 mg Oral BID  . sodium chloride flush  3 mL Intravenous Q12H  . torsemide  100 mg Oral Daily   Continuous Infusions: . sodium chloride 10 mL/hr at 02/02/18 0100   PRN Meds:.sodium chloride, acetaminophen **OR** acetaminophen, ondansetron  **OR** ondansetron (ZOFRAN) IV, sodium chloride flush, sodium chloride flush, sodium chloride flush, witch hazel-glycerin   PHYSICAL EXAM: Vital signs: Vitals:   02/07/18 2223 02/08/18 0517 02/08/18 0847 02/08/18 0900  BP: (!) 160/71 138/67    Pulse: 68 70    Resp: 18 16    Temp: 97.8 F (36.6 C) (!) 97.4 F (36.3 C)    TempSrc: Oral     SpO2: 95%  100%   Weight:    82.9 kg  Height:       Filed Weights   02/05/18 0543 02/07/18 0500 02/08/18 0900  Weight: 85.6 kg 83.9 kg 82.9 kg   Body mass index is 25.85 kg/m.   General appearance :Awake, alert, not in any distress. HEENT: Atraumatic and Normocephalic Neck: supple Resp:Good air entry bilaterally, very few scattered bibasilar rales CVS: S1 S2 regular, no murmurs.  GI: Bowel sounds present, Non tender and not distended with no gaurding, rigidity or rebound.No organomegaly Extremities: B/L Lower Ext shows 1+ edema, both legs are warm to touch Neurology:  speech  clear,Non focal, sensation is grossly intact. Musculoskeletal:No digital cyanosis Skin:No Rash, warm and dry Wounds:N/A  I have personally reviewed following labs and imaging studies  LABORATORY DATA: CBC: Recent Labs  Lab 02/03/18 0513 02/04/18 0433 02/05/18 0555 02/07/18 0527 02/08/18 0457  WBC 11.0* 11.4* 12.3* 10.1 10.6*  NEUTROABS 9.1* 9.2* 10.1* 9.3* 8.5*  HGB 8.1* 8.8* 9.0* 7.9* 8.4*  HCT 26.2* 27.2* 28.7* 25.3* 27.0*  MCV 73.0* 74.1* 74.7* 74.0* 74.2*  PLT 328 350 386 311 462    Basic Metabolic Panel: Recent Labs  Lab 02/04/18 0433 02/05/18 0555 02/06/18 0457 02/07/18 0527 02/08/18 0457  NA 138 140 138 139 140  K 3.7 3.5 3.4* 3.6 3.7  CL 102 102 102 103 103  CO2 23 25 25 25 26   GLUCOSE 91 83 74 80 92  BUN 60* 67* 71* 79* 86*  CREATININE 5.26* 5.09* 5.29* 5.37* 5.16*  CALCIUM 8.3* 8.5* 8.2* 8.2* 8.5*  MG 1.8 1.8 1.5* 1.6* 1.6*  PHOS 3.5 3.8 4.2 3.9 3.8    GFR: Estimated Creatinine Clearance: 12.2 mL/min (A) (by C-G formula based  on SCr of 5.16 mg/dL (H)).  Liver Function Tests: Recent Labs  Lab 02/04/18 0433 02/05/18 0555 02/06/18 0457 02/07/18 0527 02/08/18 0457  ALBUMIN 1.8* 2.0* 1.8* 1.8* 1.9*   No results for input(s): LIPASE, AMYLASE in the last 168 hours. No results for input(s): AMMONIA in the last 168 hours.  Coagulation Profile: No results for input(s): INR, PROTIME in the last 168 hours.  Cardiac Enzymes: No results for input(s): CKTOTAL, CKMB, CKMBINDEX, TROPONINI in the last 168 hours.  BNP (last 3 results) No results for input(s): PROBNP in the last 8760 hours.  HbA1C: No results for input(s): HGBA1C in the last 72 hours.  CBG: No results for input(s): GLUCAP in the last 168 hours.  Lipid Profile: No results for input(s): CHOL, HDL, LDLCALC, TRIG, CHOLHDL, LDLDIRECT in the last 72 hours.  Thyroid Function Tests: No results for input(s): TSH, T4TOTAL, FREET4, T3FREE, THYROIDAB in the last 72 hours.  Anemia Panel: No results for input(s): VITAMINB12, FOLATE, FERRITIN, TIBC, IRON, RETICCTPCT in the last 72 hours.  Urine analysis:    Component Value Date/Time   COLORURINE AMBER (A) 01/29/2018 2258   APPEARANCEUR CLOUDY (A) 01/29/2018 2258   LABSPEC 1.024 01/29/2018 2258   PHURINE 5.0 01/29/2018 2258   GLUCOSEU NEGATIVE 01/29/2018 2258   HGBUR MODERATE (A) 01/29/2018 2258   BILIRUBINUR SMALL (A) 01/29/2018 2258   KETONESUR 5 (A) 01/29/2018 2258   PROTEINUR 100 (A) 01/29/2018 2258   NITRITE NEGATIVE 01/29/2018 2258   LEUKOCYTESUR LARGE (A) 01/29/2018 2258    Sepsis Labs: Lactic Acid, Venous No results found for: LATICACIDVEN  MICROBIOLOGY: Recent Results (from the past 240 hour(s))  Culture, Urine     Status: Abnormal   Collection Time: 01/30/18  7:59 AM  Result Value Ref Range Status   Specimen Description URINE, RANDOM  Final   Special Requests   Final    NONE Performed at [facility], [place] [address] [place], [place] [address]    Culture (A)  Final     >=100,000 COLONIES/mL ESCHERICHIA COLI Confirmed Extended Spectrum Beta-Lactamase Producer (ESBL).  In bloodstream infections from ESBL organisms, carbapenems are preferred over piperacillin/tazobactam. They are shown to have a lower risk of mortality.    Report Status 02/01/2018 FINAL  Final   Organism ID, Bacteria ESCHERICHIA COLI (A)  Final      Susceptibility   Escherichia coli - MIC*  AMPICILLIN >=32 RESISTANT Resistant     CEFAZOLIN >=64 RESISTANT Resistant     CEFTRIAXONE RESISTANT Resistant     CIPROFLOXACIN >=4 RESISTANT Resistant     GENTAMICIN <=1 SENSITIVE Sensitive     IMIPENEM <=0.25 SENSITIVE Sensitive     NITROFURANTOIN <=16 SENSITIVE Sensitive     TRIMETH/SULFA >=320 RESISTANT Resistant     AMPICILLIN/SULBACTAM >=32 RESISTANT Resistant     PIP/TAZO 8 SENSITIVE Sensitive     Extended ESBL POSITIVE Resistant     * >=100,000 COLONIES/mL ESCHERICHIA COLI    RADIOLOGY STUDIES/RESULTS: Ct Abdomen Pelvis Wo Contrast  Result Date: 01/09/2018 CLINICAL DATA:  Low hemoglobin. EXAM: CT ABDOMEN AND PELVIS WITHOUT CONTRAST TECHNIQUE: Multidetector CT imaging of the abdomen and pelvis was performed following the standard protocol without IV contrast. COMPARISON:  CT 02/04/2015, abdomen MRI 12/09/2017 FINDINGS: Lower chest: Small bilateral pleural effusions. Cardiomegaly without pericardial effusion or thickening. Atherosclerosis of the included thoracic aorta without aneurysm. Coronary arteriosclerosis is seen. Pulmonary consolidation/atelectasis in the right middle lobe distribution. Hepatobiliary: Layering biliary sludge within the gallbladder without secondary signs of acute cholecystitis. The unenhanced liver demonstrates a granuloma in the right hepatic lobe. No definite mass or biliary dilatation. Pancreas: Unremarkable. No pancreatic ductal dilatation or surrounding inflammatory changes. Spleen: Normal size spleen with scattered calcifications noted within possibly representing  vascular calcifications or granulomata. Adrenals/Urinary Tract: Scattered bilateral hyperdense lesions of the kidneys are nonspecific but may represent proteinaceous or hemorrhagic cysts, the largest is right-sided and interpolar measuring up 1.6 cm with more ill-defined hyperdensity slightly exophytic off the right kidney. No nephrolithiasis or obstructive uropathy. The urinary bladder is unremarkable for the degree of distention. Stomach/Bowel: Stomach is within normal limits. The appendix is not confidently identified. No evidence of bowel wall thickening, distention, or inflammatory changes. Vascular/Lymphatic: Aortic atherosclerosis. No lymphadenopathy. Reproductive: Prostatomegaly measuring up to 5.7 cm. Other: Diffuse soft tissue anasarca. No ascites or free air. 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 IMPRESSION: 1. Pulmonary consolidation/atelectasis in the right middle lobe with small bilateral pleural effusions. 2. Scattered bilateral hyperdense lesions of the kidneys likely to represent proteinaceous or hemorrhagic cysts, the largest is in the right interpolar and interpolar measuring 1.6 cm with more ill-defined hyperdensity slightly exophytic off the right kidney. Findings better characterized on recent prior MRI. 3. Hepatic and splenic granulomata. 4. Diffuse soft tissue anasarca. 5. 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 Aortic Atherosclerosis (ICD10-I70.0). Electronically Signed   By: Ashley Royalty M.D.   On: 01/09/2018 20:33   Dg Chest 2 View  Result Date: 02/03/2018 CLINICAL DATA:  Dyspnea. EXAM: CHEST - 2 VIEW COMPARISON:  Radiographs of January 29, 2018. FINDINGS: Stable cardiomediastinal silhouette. No pneumothorax is noted. Right internal jugular dialysis catheter is unchanged in position. Stable right basilar atelectasis or infiltrate is noted, with minimal left basilar subsegmental atelectasis. Bilateral pleural effusions are noted with right greater than left. Bony  thorax is unremarkable. IMPRESSION: Stable right basilar atelectasis or infiltrate is noted with associated pleural effusion. Minimal left basilar atelectasis is also noted with minimal left pleural effusion. Electronically Signed   By: Marijo Conception, M.D.   On: 02/03/2018 09:07   Dg Chest 2 View  Result Date: 01/29/2018 CLINICAL DATA:  Patient admitted for leg swelling EXAM: CHEST - 2 VIEW COMPARISON:  Chest radiograph 01/14/2018 FINDINGS: Right anterior chest wall Port-A-Cath is present with tip projecting over the superior vena cava. Monitoring leads overlie the patient. Stable cardiomegaly. Low lung volumes. Bibasilar heterogeneous opacities, right greater than  left. Small bilateral pleural effusions. Regional skeleton is unremarkable. IMPRESSION: Low lung volumes with small bilateral effusions and underlying opacities, similar to prior. Possibility of mass within the right lower lung not excluded. Electronically Signed   By: Lovey Newcomer M.D.   On: 01/29/2018 11:07   Dg Chest 2 View  Result Date: 01/14/2018 CLINICAL DATA:  Shortness of breath EXAM: CHEST - 2 VIEW COMPARISON:  Chest radiograph 01/09/2018 FINDINGS: Monitoring leads overlie the patient. Stable cardiac and mediastinal contours. Similar-appearing patchy consolidation within the right greater than left lower lungs. Moderate right and small left pleural effusions. No pneumothorax. IMPRESSION: Similar-appearing patchy consolidation within the right greater than left lower lungs with moderate right and small left pleural effusions. Electronically Signed   By: Lovey Newcomer M.D.   On: 01/14/2018 16:08   Mr Brain Wo Contrast  Result Date: 01/29/2018 CLINICAL DATA:  Altered level of consciousness. Chronic kidney disease and anemia. EXAM: MRI HEAD WITHOUT CONTRAST TECHNIQUE: Multiplanar, multiecho pulse sequences of the brain and surrounding structures were obtained without intravenous contrast. COMPARISON:  None. FINDINGS: Brain: The study is  mildly degraded by patient motion. No acute infarct, hemorrhage, or mass lesion is present. Periventricular and subcortical white matter changes are mildly advanced for age. Mild generalized atrophy is noted. 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 White matter changes extend into the brainstem. A remote lacunar infarct is present in the right cerebellum. The internal auditory canals are within normal limits. The ventricles are of normal size. No significant extraaxial fluid collection is present. Vascular: Flow is present in the major intracranial arteries. Skull and upper cervical spine: Craniocervical junction is normal. Decreased marrow signal is present in the upper cervical spine, compatible with anemia and kidney failure. Vertebral body heights are maintained. Degenerative changes are noted. Sinuses/Orbits: The paranasal sinuses and mastoid air cells are clear. A right lens replacement is present. Globes and orbits are otherwise within normal limits. IMPRESSION: 1. No acute intracranial abnormality to explain altered level of consciousness. 2. Atrophy and diffuse white matter disease is mildly advanced for age. This likely reflects the sequela of chronic microvascular ischemia in this patient with chronic kidney disease. 3. 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 4. Decreased marrow signal most consistent with known anemia. Electronically Signed   By: San Morelle M.D.   On: 01/29/2018 11:45   US Renal  Result Date: 01/13/2018 CLINICAL DATA:  Oliguria.  Elevated PSA. EXAM: RENAL / URINARY TRACT ULTRASOUND COMPLETE COMPARISON:  CT abdomen and pelvis 01/09/2018. FINDINGS: Right Kidney: Renal measurements: 10.5 x 4.6 x 5.0 cm = volume: 126.8 mL . Echogenicity within normal limits. A simple cyst measuring 1.9 cm in diameter is identified. No hydronephrosis visualized. Trace amount of perihepatic ascites is noted. Left Kidney: Renal  measurements: 11.4 x 5.5 x 5.1 cm = volume: 163.2 mL. Echogenicity within normal limits. No mass or hydronephrosis visualized. Bladder: The bladder is almost completely decompressed. No focal abnormality. Prostatomegaly is seen. IMPRESSION: Negative for hydronephrosis or acute abnormality. Prostatomegaly. Trace amount of ascites. Electronically Signed   By: Inge Rise M.D.   On: 01/13/2018 11:29   Ir Fluoro Guide Cv Line Right  Result Date: 01/31/2018 CLINICAL DATA:  End-stage renal disease, needs durable venous access for hemodialysis. A temporary right IJ hemodialysis catheter was placed 01/16/2018. EXAM: TUNNELED HEMODIALYSIS CATHETER PLACEMENT WITH ULTRASOUND AND FLUOROSCOPIC GUIDANCE TECHNIQUE: The procedure, risks, benefits, and alternatives were explained to the patient. Questions regarding the procedure  were encouraged and answered. The patient understands and consents to the procedure. As antibiotic prophylaxis, cefazolin 2 g was ordered pre-procedure and administered intravenously within one hour of incision. Patency of a good-sized right external jugular vein was confirmed with ultrasound with image documentation. An appropriate skin site was determined. Region was prepped using maximum barrier technique including cap and mask, sterile gown, sterile gloves, large sterile sheet, and Chlorhexidine as cutaneous antisepsis. The region was infiltrated locally with 1% lidocaine. Intravenous Fentanyl and Versed were administered as conscious sedation during continuous monitoring of the patient's level of consciousness and physiological / cardiorespiratory status by the radiology RN, with a total moderate sedation time of 17 minutes. Under real-time ultrasound guidance, the right EJ vein was accessed with a 21 gauge micropuncture needle; the needle tip within the vein was confirmed with ultrasound image documentation. Needle exchanged over the 018 guidewire for transitional dilator, which allowed  advancement of a Benson wire into the IVC. Over this, an MPA catheter was advanced. A Palindrome 23 hemodialysis catheter was tunneled from the right anterior chest wall approach to the right EJ dermatotomy site. The MPA catheter was exchanged over an Amplatz wire for serial vascular dilators which allow placement of a peel-away sheath, through which the catheter was advanced under intermittent fluoroscopy, positioned with its tips in the proximal and midright atrium. Spot chest radiograph confirms good catheter position. No pneumothorax. Catheter was flushed and primed per protocol. Catheter secured externally with O Prolene sutures. The right EJ dermatotomy site was closed with Dermabond. COMPLICATIONS: COMPLICATIONS None immediate FLUOROSCOPY TIME:  48 seconds; 2 mGy COMPARISON:  None IMPRESSION: 1. Technically successful placement of tunneled right EJ hemodialysis catheter with ultrasound and fluoroscopic guidance. Ready for routine use. ACCESS: Remains approachable for percutaneous intervention as needed. Electronically Signed   By: Lucrezia Europe M.D.   On: 01/31/2018 16:54   Ir Fluoro Guide Cv Line Right  Result Date: 01/16/2018 INDICATION: 77 year old male with acute renal failure in need of hemodialysis. We had an initial plan for placement of a tunneled hemodialysis catheter, however he has leukocytosis today as well as new onset atrial fibrillation with uncontrolled rapid ventricular response. Therefore, we will place a non tunneled hemodialysis catheter to allow him to get hemodialysis quickly without the additional stress of moderate sedation. We can then convert the non tunneled catheter to a tunneled device once his other issues have been addressed. EXAM: IR RIGHT FLOURO GUIDE CV LINE; IR ULTRASOUND GUIDANCE VASC ACCESS RIGHT MEDICATIONS: None ANESTHESIA/SEDATION: None FLUOROSCOPY TIME:  Fluoroscopy Time: 0 minutes 12 seconds (1 mGy). COMPLICATIONS: None immediate. PROCEDURE: Informed written consent  was obtained from the patient after a thorough discussion of the procedural risks, benefits and alternatives. All questions were addressed. Maximal Sterile Barrier Technique was utilized including caps, mask, sterile gowns, sterile gloves, sterile drape, hand hygiene and skin antiseptic. A timeout was performed prior to the initiation of the procedure. The right internal jugular vein was interrogated with ultrasound and found to be widely patent. An image was obtained and stored for the medical record. Local anesthesia was attained by infiltration with 1% lidocaine. A small dermatotomy was made. Under real-time sonographic guidance, the vessel was punctured with an 18 gauge needle. A 0.035 wire was advanced into the inferior vena cava. The needle was removed. The skin tract was dilated and a 16 cm triple-lumen hemodialysis catheter was advanced over the wire and positioned with the catheter tip at the superior cavoatrial junction. The catheter was aspirated and then  flushed with heparinized saline. The catheter was then secured to the skin with 0 Prolene suture and a sterile bandage. The patient tolerated the procedure well. IMPRESSION: Successful placement of a non tunneled hemodialysis catheter via the right internal jugular vein. The tip of the catheter is at the superior cavoatrial junction and the catheter is ready for immediate use. Electronically Signed   By: Jacqulynn Cadet M.D.   On: 01/16/2018 14:05   Ir US Guide Vasc Access Right  Result Date: 01/31/2018 CLINICAL DATA:  End-stage renal disease, needs durable venous access for hemodialysis. A temporary right IJ hemodialysis catheter was placed 01/16/2018. EXAM: TUNNELED HEMODIALYSIS CATHETER PLACEMENT WITH ULTRASOUND AND FLUOROSCOPIC GUIDANCE TECHNIQUE: The procedure, risks, benefits, and alternatives were explained to the patient. Questions regarding the procedure were encouraged and answered. The patient understands and consents to the procedure.  As antibiotic prophylaxis, cefazolin 2 g was ordered pre-procedure and administered intravenously within one hour of incision. Patency of a good-sized right external jugular vein was confirmed with ultrasound with image documentation. An appropriate skin site was determined. Region was prepped using maximum barrier technique including cap and mask, sterile gown, sterile gloves, large sterile sheet, and Chlorhexidine as cutaneous antisepsis. The region was infiltrated locally with 1% lidocaine. Intravenous Fentanyl and Versed were administered as conscious sedation during continuous monitoring of the patient's level of consciousness and physiological / cardiorespiratory status by the radiology RN, with a total moderate sedation time of 17 minutes. Under real-time ultrasound guidance, the right EJ vein was accessed with a 21 gauge micropuncture needle; the needle tip within the vein was confirmed with ultrasound image documentation. Needle exchanged over the 018 guidewire for transitional dilator, which allowed advancement of a Benson wire into the IVC. Over this, an MPA catheter was advanced. A Palindrome 23 hemodialysis catheter was tunneled from the right anterior chest wall approach to the right EJ dermatotomy site. The MPA catheter was exchanged over an Amplatz wire for serial vascular dilators which allow placement of a peel-away sheath, through which the catheter was advanced under intermittent fluoroscopy, positioned with its tips in the proximal and midright atrium. Spot chest radiograph confirms good catheter position. No pneumothorax. Catheter was flushed and primed per protocol. Catheter secured externally with O Prolene sutures. The right EJ dermatotomy site was closed with Dermabond. COMPLICATIONS: COMPLICATIONS None immediate FLUOROSCOPY TIME:  48 seconds; 2 mGy COMPARISON:  None IMPRESSION: 1. Technically successful placement of tunneled right EJ hemodialysis catheter with ultrasound and fluoroscopic  guidance. Ready for routine use. ACCESS: Remains approachable for percutaneous intervention as needed. Electronically Signed   By: Lucrezia Europe M.D.   On: 01/31/2018 16:54   Ir US Guide Vasc Access Right  Result Date: 01/16/2018 INDICATION: 78 year old male with acute renal failure in need of hemodialysis. We had an initial plan for placement of a tunneled hemodialysis catheter, however he has leukocytosis today as well as new onset atrial fibrillation with uncontrolled rapid ventricular response. Therefore, we will place a non tunneled hemodialysis catheter to allow him to get hemodialysis quickly without the additional stress of moderate sedation. We can then convert the non tunneled catheter to a tunneled device once his other issues have been addressed. EXAM: IR RIGHT FLOURO GUIDE CV LINE; IR ULTRASOUND GUIDANCE VASC ACCESS RIGHT MEDICATIONS: None ANESTHESIA/SEDATION: None FLUOROSCOPY TIME:  Fluoroscopy Time: 0 minutes 12 seconds (1 mGy). COMPLICATIONS: None immediate. PROCEDURE: Informed written consent was obtained from the patient after a thorough discussion of the procedural risks, benefits and alternatives. All questions  were addressed. Maximal Sterile Barrier Technique was utilized including caps, mask, sterile gowns, sterile gloves, sterile drape, hand hygiene and skin antiseptic. A timeout was performed prior to the initiation of the procedure. The right internal jugular vein was interrogated with ultrasound and found to be widely patent. An image was obtained and stored for the medical record. Local anesthesia was attained by infiltration with 1% lidocaine. A small dermatotomy was made. Under real-time sonographic guidance, the vessel was punctured with an 18 gauge needle. A 0.035 wire was advanced into the inferior vena cava. The needle was removed. The skin tract was dilated and a 16 cm triple-lumen hemodialysis catheter was advanced over the wire and positioned with the catheter tip at the  superior cavoatrial junction. The catheter was aspirated and then flushed with heparinized saline. The catheter was then secured to the skin with 0 Prolene suture and a sterile bandage. The patient tolerated the procedure well. IMPRESSION: Successful placement of a non tunneled hemodialysis catheter via the right internal jugular vein. The tip of the catheter is at the superior cavoatrial junction and the catheter is ready for immediate use. Electronically Signed   By: Jacqulynn Cadet M.D.   On: 01/16/2018 14:05   Dg Chest Portable 1 View  Result Date: 01/09/2018 CLINICAL DATA:  Shortness of breath for 1 day. 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 EXAM: PORTABLE CHEST 1 VIEW COMPARISON:  Radiographs 11/09/2017. Abdominal MRI 12/09/2017. PET-CT 11/25/2015. FINDINGS: 1902 hours. There are right-greater-than-left pleural effusions which have enlarged. There is associated bibasilar pulmonary opacity, likely atelectasis. There are stable postsurgical changes in the right hilar region. No pneumothorax or acute osseous findings are seen. IMPRESSION: Enlarging right-greater-than-left pleural effusions with associated bibasilar pulmonary opacities, likely atelectasis. No invert pulmonary edema. Electronically Signed   By: Richardean Sale M.D.   On: 01/09/2018 19:35   US Biopsy (kidney)  Result Date: 01/25/2018 INDICATION: 78 year old with acute on chronic renal failure. Request for renal biopsy. EXAM: ULTRASOUND-GUIDED RIGHT RENAL BIOPSY MEDICATIONS: None. ANESTHESIA/SEDATION: Moderate (conscious) sedation was employed during this procedure. A total of Versed 1.0 mg and Fentanyl 50 mcg was administered intravenously. Moderate Sedation Time: 11 minutes. The patient's level of consciousness and vital signs were monitored continuously by radiology nursing throughout the procedure under my direct supervision. FLUOROSCOPY TIME:  None COMPLICATIONS: None immediate. PROCEDURE: Informed written consent was  obtained from the patient after a thorough discussion of the procedural risks, benefits and alternatives. All questions were addressed. Both kidneys were evaluated with ultrasound. The right kidney was targeted for biopsy. Right flank was prepped with chlorhexidine and sterile field was created. Maximal barrier sterile technique was utilized including mask, sterile gowns, sterile gloves, sterile drape, hand hygiene and skin antiseptic. Skin was anesthetized with 1% lidocaine. 77 gauge core biopsy needle was directed into the right kidney lower pole with ultrasound guidance. Two core biopsies were obtained. Core biopsies appeared adequate and placed in saline. Bandage placed over the puncture site. FINDINGS: Negative for hydronephrosis. Core biopsies obtained from the right kidney lower pole. No significant bleeding or hematoma formation at the end of the procedure. IMPRESSION: Ultrasound-guided core biopsies of the right kidney lower pole. Electronically Signed   By: Markus Daft M.D.   On: 01/25/2018 17:13     LOS: 30 days   Oren Binet, MD  [facility]  If 7PM-7AM, please contact night-coverage  Please page via [email or web address]-click on MD name and type text message  02/08/2018, 12:49 PM

## 2018-02-09 LAB — RENAL FUNCTION PANEL
Albumin: 1.9 g/dL — ABNORMAL LOW (ref 3.5–5.0)
Anion gap: 10 (ref 5–15)
BUN: 85 mg/dL — ABNORMAL HIGH (ref 8–23)
CO2: 26 mmol/L (ref 22–32)
Calcium: 8.1 mg/dL — ABNORMAL LOW (ref 8.9–10.3)
Chloride: 102 mmol/L (ref 98–111)
Creatinine, Ser: 4.61 mg/dL — ABNORMAL HIGH (ref 0.61–1.24)
GFR calc Af Amer: 13 mL/min — ABNORMAL LOW (ref >60)
GFR calc non Af Amer: 11 mL/min — ABNORMAL LOW (ref >60)
GLUCOSE: 85 mg/dL (ref 70–99)
Phosphorus: 3.6 mg/dL (ref 2.5–4.6)
Potassium: 2.8 mmol/L — ABNORMAL LOW (ref 3.5–5.1)
Sodium: 138 mmol/L (ref 135–145)

## 2018-02-09 MED ORDER — AMIODARONE HCL 200 MG PO TABS: 200.0000 mg | ORAL_TABLET | Freq: Two times a day (BID) | ORAL | 0 refills | Status: DC

## 2018-02-09 MED ORDER — POTASSIUM CHLORIDE CRYS ER 20 MEQ PO TBCR: 40.0000 meq | EXTENDED_RELEASE_TABLET | Freq: Every day | ORAL | 0 refills | Status: DC

## 2018-02-09 MED ORDER — NEPRO/CARBSTEADY PO LIQD: 237.0000 mL | Freq: Three times a day (TID) | ORAL | 0 refills | Status: DC

## 2018-02-09 MED ORDER — POTASSIUM CHLORIDE CRYS ER 20 MEQ PO TBCR
40.0000 meq | EXTENDED_RELEASE_TABLET | ORAL | Status: AC
  Administered 2018-02-09 (×2): 40 meq via ORAL
  Filled 2018-02-09 (×2): qty 2

## 2018-02-09 MED ORDER — TORSEMIDE 100 MG PO TABS: 100.0000 mg | ORAL_TABLET | Freq: Every day | ORAL | 0 refills | Status: DC

## 2018-02-09 NOTE — Discharge Summary (Signed)
PATIENT DETAILS Name: Andrew Guerra Age: 78 y.o. Sex: male Date of Birth: [birth year] MRN: [id number]. Admitting Physician: [name], DO [id number], Elayne Snare, MD  Admit Date: 01/09/2018 Discharge date: 02/09/2018  Recommendations for Outpatient Follow-up:  1. Follow up with PCP in 1-2 weeks 2. Please obtain BMP/CBC in one week 3. Being discharged on home O2-titrate as tolerated 4. Ensure follow-up with nephrology 5. Ensure follow-up with hematology  Admitted From:  Home  Disposition: Home with outpatient physical therapy   Home Health: No  Equipment/Devices: None  Discharge Condition: Stable  CODE STATUS: FULL CODE  Diet recommendation:  Heart Healthy  Brief Summary: See H&P, Labs, Consult and Test reports for all details in brief, Patient is a 78 y.o. 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  He underwent EGD which showed gastric/duodenal ulcers.  Hospital course was prolonged as patient developed likely secondary to ATN-briefly required hemodialysis.  Renal function has improved-responding to [facility] below for further details.  Brief Hospital Course: AKI on CKD stage IV: AKI likely hemodynamically mediated-probably ATN-in the setting of GI bleed with acute blood loss anemia and A. fib with RVR.  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  Briefly required HD during the early part of his hospital stay-renal function slowly improving.  Nephrology recommending discharge with current dosing of Demadex-outpatient appointment with nephrology in place.  Nephrology recommended we continue panel dialysis catheter on discharge-in case he requires HD in the outpatient setting.  Acute on chronic diastolic heart  failure: Volume status has improved after HD and now with Demadex.  Although volume status improved-still requiring some amount of O2-see below.  GI bleeding with acute on chronic blood loss anemia: GI bleeding has resolved-underwent EGD on 12/24 which showed gastric/duodenal ulcers (nonbleeding).  Hemoglobin stable-did require 2 units of PRBC during this hospital stay.  Hemoglobin currently stable.  Continue PPI.  Paroxysmal A. fib with GYK:[id number] amiodarone-switch amiodarone to once daily dosing from 1/25.  Not a anticoagulation candidate due to recent GI bleeding.CHA2DS2-VASc Score 3  Bilateral pneumonia with acute hypoxic respiratory failure: Improved-continue incentive spirometry-attempt to wean off oxygen.  Completed a course of Rocephin/Zithromax.   Continues to still require oxygen-this morning when he was off oxygen on room air he was around 87%-with just 2 L of oxygen he is stable with O2 saturation in the low 90s.  Hypokalemia: Likely secondary to Demadex-potassium will be repleted before patient is discharged-and upon discharge will be placed on 40 mEq of potassium daily.  Please recheck electrolytes in 1 week at PCPs office  Asymptomatic bacteriuria: Urine culture with positive ESBL E. coli-likely a colonization.  Delirium: Seems to have resolved-continue to avoid all sedatives-MRI brain was negative for acute abnormality.  He is completely awake and alert this morning  Bicuspid aortic valve with moderate aortic stenosis: Stable for outpatient follow-up by cardiology.  NSCLC: Treated with right upper lobectomy at [facility]-subsequently required radiation.  Follows up every 6 months at the oncology center at [facility].  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  But no mutation seen on hemochromatosis DNA-PCR.  Hematology appointment upcoming.  Moderate malnutrition: Continue supplements  Procedures/Studies: 12/24>>EGD 1/8>> kidney  biopsy  Discharge Diagnoses:  Principal  Problem:   GI bleed Active Problems:   Essential hypertension, benign   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   Malignant neoplasm of left lung (HCC)   Acute on chronic diastolic CHF (congestive heart failure) (HCC)   Malnutrition of moderate degree   Iron deficiency anemia   Anasarca   Intestinal metaplasia of gastric mucosa   Pressure injury of skin   PAF (paroxysmal atrial fibrillation) ([place])   Acute renal failure [facility])   Discharge Instructions:  Activity:  As tolerated  Discharge Instructions    Diet - low sodium heart healthy   Complete by:  As directed    Discharge instructions   Complete by:  As directed    Follow with Primary MD  [name], MD in 1 week  Follow-up with Dr. Patel-nephrologist as instructed  Please get a complete blood count and chemistry panel checked by your Primary MD at your next visit, and again as instructed by your Primary MD.  Get Medicines reviewed and adjusted: Please take all your medications with you for your next visit with your Primary MD  Laboratory/radiological data: Please request your Primary MD to go over all hospital tests and procedure/radiological results at the follow up, please ask your Primary MD to get all Hospital records sent to his/her office.  In some cases, they will be blood work, cultures and biopsy results pending at the time of your discharge. Please request that your primary care M.D. follows up on these results.  Also Note the following: If you experience worsening of your admission symptoms, develop shortness of breath, life threatening emergency, suicidal or homicidal thoughts you must seek medical attention immediately by calling 911 or calling your MD immediately  if symptoms less severe.  You must read complete instructions/literature along with all the possible adverse reactions/side effects for all the Medicines you take and that have been prescribed to  you. Take any new Medicines after you have completely understood and accpet all the possible adverse reactions/side effects.   Do not drive when taking Pain medications or sleeping medications (Benzodaizepines)  Do not take more than prescribed Pain, Sleep and Anxiety Medications. It is not advisable to combine anxiety,sleep and pain medications without talking with your primary care practitioner  Special Instructions: If you have smoked or chewed Tobacco  in the last 2 yrs please stop smoking, stop any regular Alcohol  and or any Recreational drug use.  Wear Seat belts while driving.  Please note: You were cared for by a hospitalist during your hospital stay. Once you are discharged, your primary care physician will handle any further medical issues. Please note that NO REFILLS for any discharge medications will be authorized once you are discharged, as it is imperative that you return to your primary care physician (or establish a relationship with a primary care physician if you do not have one) for your post hospital discharge needs so that they can reassess your need for medications and monitor your lab values.   Increase activity slowly   Complete by:  As directed      Allergies as of 02/09/2018      Reactions   Ambien [zolpidem Tartrate] Other (See Comments)   Has hallucinations and Homicidal Ideations per Wife    Atorvastatin    Achy and tired      Medication List    STOP taking these medications   amLODipine 2.5 MG tablet Commonly known as:  NORVASC   aspirin EC 81 MG tablet  ketoconazole 2 % cream Commonly known as:  NIZORAL   metoprolol succinate 50 MG 24 hr tablet Commonly known as:  TOPROL-XL     TAKE these medications   amiodarone 200 MG tablet Commonly known as:  PACERONE Take 1 tablet (200 mg total) by mouth 2 (two) times daily. Switch amiodarone to once daily dosing from 02/11/18   budesonide-formoterol 80-4.5 MCG/ACT inhaler Commonly known as:   SYMBICORT Inhale 2 puffs into the lungs 2 (two) times daily.   feeding supplement (NEPRO CARB STEADY) Liqd Take 237 mLs by mouth 3 (three) times daily between meals.   multivitamin tablet Take 1 tablet by mouth daily.   NONFORMULARY OR COMPOUNDED ITEM Outpatient PT-please treat and evaluate  Diagnosis: Deconditioning/debility due to prolonged hospitalization, kidney failure, heart failure   pantoprazole 40 MG tablet Commonly known as:  PROTONIX Take 1 tablet (40 mg total) by mouth 2 (two) times daily.   potassium chloride SA 20 MEQ tablet Commonly known as:  K-DUR,KLOR-CON Take 2 tablets (40 mEq total) by mouth daily.   ROCKLATAN 0.02-0.005 % Soln Generic drug:  Netarsudil-Latanoprost Place 1 drop into both eyes at bedtime.   SIMBRINZA 1-0.2 % Susp Generic drug:  Brinzolamide-Brimonidine Place 2-3 drops into both eyes 3 (three) times daily.   torsemide 100 MG tablet Commonly known as:  DEMADEX Take 1 tablet (100 mg total) by mouth daily. What changed:    medication strength  how much to take  how to take this  when to take this  additional instructions            Durable Medical Equipment  (From admission, onward)         Start     Ordered   02/09/18 0842  For home use only DME oxygen  Once    Question Answer Comment  Mode or (Route) Nasal cannula   Liters per Minute 2   Frequency Continuous (stationary and portable oxygen unit needed)   Oxygen conserving device Yes   Oxygen delivery system Gas      02/09/18 0841   02/07/18 1207  For home use only DME 4 wheeled rolling walker with seat  Once    Question:  Patient needs a walker to treat with the following condition  Answer:  Weakness   02/07/18 1206         Follow-up Information    Elmarie Shiley, MD Follow up on 03/09/2018.   Specialty:  Nephrology Why:  Appointment at 3:30 PM.  You will have weekly labs every Wednesday beginning on 02/15/2018 Contact information: [place]  [address] [phone]        [name], MD. Schedule an appointment as soon as possible for a visit in 1 week(s).   Specialty:  Family Medicine Contact information: [place] [address] [phone]        Sueanne Margarita, MD. Schedule an appointment as soon as possible for a visit in 2 week(s).   Specialty:  Cardiology Contact information: [address] [place] [address] [phone]          Allergies  Allergen Reactions  . Ambien [Zolpidem Tartrate] Other (See Comments)    Has hallucinations and Homicidal Ideations per Wife   . Atorvastatin     Achy and tired    Consultations:   cardiology, GI and nephrology   Other Procedures/Studies: Dg Chest 2 View  Result Date: 02/03/2018 CLINICAL DATA:  Dyspnea. EXAM: CHEST -  2 VIEW COMPARISON:  Radiographs of January 29, 2018. FINDINGS: Stable cardiomediastinal silhouette. No pneumothorax is noted. Right internal jugular dialysis catheter is unchanged in position. Stable right basilar atelectasis or infiltrate is noted, with minimal left basilar subsegmental atelectasis. Bilateral pleural effusions are noted with right greater than left. Bony thorax is unremarkable. IMPRESSION: Stable right basilar atelectasis or infiltrate is noted with associated pleural effusion. Minimal left basilar atelectasis is also noted with minimal left pleural effusion. Electronically Signed   By: Marijo Conception, M.D.   On: 02/03/2018 09:07   Dg Chest 2 View  Result Date: 01/29/2018 CLINICAL DATA:  Patient admitted for leg swelling EXAM: CHEST - 2 VIEW COMPARISON:  Chest radiograph 01/14/2018 FINDINGS: Right anterior chest wall Port-A-Cath is present with tip projecting over the superior vena cava. Monitoring leads overlie the patient. Stable cardiomegaly. Low lung volumes. Bibasilar heterogeneous opacities, right greater than left. Small bilateral pleural effusions. Regional skeleton is unremarkable.  IMPRESSION: Low lung volumes with small bilateral effusions and underlying opacities, similar to prior. Possibility of mass within the right lower lung not excluded. Electronically Signed   By: Lovey Newcomer M.D.   On: 01/29/2018 11:07   Dg Chest 2 View  Result Date: 01/14/2018 CLINICAL DATA:  Shortness of breath EXAM: CHEST - 2 VIEW COMPARISON:  Chest radiograph 01/09/2018 FINDINGS: Monitoring leads overlie the patient. Stable cardiac and mediastinal contours. Similar-appearing patchy consolidation within the right greater than left lower lungs. Moderate right and small left pleural effusions. No pneumothorax. IMPRESSION: Similar-appearing patchy consolidation within the right greater than left lower lungs with moderate right and small left pleural effusions. Electronically Signed   By: Lovey Newcomer M.D.   On: 01/14/2018 16:08   Mr Brain Wo Contrast  Result Date: 01/29/2018 CLINICAL DATA:  Altered level of consciousness. Chronic kidney disease and anemia. EXAM: MRI HEAD WITHOUT CONTRAST TECHNIQUE: Multiplanar, multiecho pulse sequences of the brain and surrounding structures were obtained without intravenous contrast. COMPARISON:  None. FINDINGS: Brain: The study is mildly degraded by patient motion. No acute infarct, hemorrhage, or mass lesion is present. Periventricular and subcortical white matter changes are mildly advanced for age. Mild generalized atrophy is noted. 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 White matter changes extend into the brainstem. A remote lacunar infarct is present in the right cerebellum. The internal auditory canals are within normal limits. The ventricles are of normal size. No significant extraaxial fluid collection is present. Vascular: Flow is present in the major intracranial arteries. Skull and upper cervical spine: Craniocervical junction is normal. Decreased marrow signal is present in the upper cervical spine, compatible with anemia and kidney  failure. Vertebral body heights are maintained. Degenerative changes are noted. Sinuses/Orbits: The paranasal sinuses and mastoid air cells are clear. A right lens replacement is present. Globes and orbits are otherwise within normal limits. IMPRESSION: 1. No acute intracranial abnormality to explain altered level of consciousness. 2. Atrophy and diffuse white matter disease is mildly advanced for age. This likely reflects the sequela of chronic microvascular ischemia in this patient with chronic kidney disease. 3. 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 4. Decreased marrow signal most consistent with known anemia. Electronically Signed   By: San Morelle M.D.   On: 01/29/2018 11:45   US Renal  Result Date: 01/13/2018 CLINICAL DATA:  Oliguria.  Elevated PSA. EXAM: RENAL / URINARY TRACT ULTRASOUND COMPLETE COMPARISON:  CT abdomen and pelvis 01/09/2018. FINDINGS: Right Kidney: Renal measurements: 10.5 x  4.6 x 5.0 cm = volume: 126.8 mL . Echogenicity within normal limits. A simple cyst measuring 1.9 cm in diameter is identified. No hydronephrosis visualized. Trace amount of perihepatic ascites is noted. Left Kidney: Renal measurements: 11.4 x 5.5 x 5.1 cm = volume: 163.2 mL. Echogenicity within normal limits. No mass or hydronephrosis visualized. Bladder: The bladder is almost completely decompressed. No focal abnormality. Prostatomegaly is seen. IMPRESSION: Negative for hydronephrosis or acute abnormality. Prostatomegaly. Trace amount of ascites. Electronically Signed   By: Inge Rise M.D.   On: 01/13/2018 11:29   Ir Fluoro Guide Cv Line Right  Result Date: 01/31/2018 CLINICAL DATA:  End-stage renal disease, needs durable venous access for hemodialysis. A temporary right IJ hemodialysis catheter was placed 01/16/2018. EXAM: TUNNELED HEMODIALYSIS CATHETER PLACEMENT WITH ULTRASOUND AND FLUOROSCOPIC GUIDANCE TECHNIQUE: The procedure, risks, benefits, and  alternatives were explained to the patient. Questions regarding the procedure were encouraged and answered. The patient understands and consents to the procedure. As antibiotic prophylaxis, cefazolin 2 g was ordered pre-procedure and administered intravenously within one hour of incision. Patency of a good-sized right external jugular vein was confirmed with ultrasound with image documentation. An appropriate skin site was determined. Region was prepped using maximum barrier technique including cap and mask, sterile gown, sterile gloves, large sterile sheet, and Chlorhexidine as cutaneous antisepsis. The region was infiltrated locally with 1% lidocaine. Intravenous Fentanyl and Versed were administered as conscious sedation during continuous monitoring of the patient's level of consciousness and physiological / cardiorespiratory status by the radiology RN, with a total moderate sedation time of 17 minutes. Under real-time ultrasound guidance, the right EJ vein was accessed with a 21 gauge micropuncture needle; the needle tip within the vein was confirmed with ultrasound image documentation. Needle exchanged over the 018 guidewire for transitional dilator, which allowed advancement of a Benson wire into the IVC. Over this, an MPA catheter was advanced. A Palindrome 23 hemodialysis catheter was tunneled from the right anterior chest wall approach to the right EJ dermatotomy site. The MPA catheter was exchanged over an Amplatz wire for serial vascular dilators which allow placement of a peel-away sheath, through which the catheter was advanced under intermittent fluoroscopy, positioned with its tips in the proximal and midright atrium. Spot chest radiograph confirms good catheter position. No pneumothorax. Catheter was flushed and primed per protocol. Catheter secured externally with O Prolene sutures. The right EJ dermatotomy site was closed with Dermabond. COMPLICATIONS: COMPLICATIONS None immediate FLUOROSCOPY TIME:   48 seconds; 2 mGy COMPARISON:  None IMPRESSION: 1. Technically successful placement of tunneled right EJ hemodialysis catheter with ultrasound and fluoroscopic guidance. Ready for routine use. ACCESS: Remains approachable for percutaneous intervention as needed. Electronically Signed   By: Lucrezia Europe M.D.   On: 01/31/2018 16:54   Ir Fluoro Guide Cv Line Right  Result Date: 01/16/2018 INDICATION: 78 year old male with acute renal failure in need of hemodialysis. We had an initial plan for placement of a tunneled hemodialysis catheter, however he has leukocytosis today as well as new onset atrial fibrillation with uncontrolled rapid ventricular response. Therefore, we will place a non tunneled hemodialysis catheter to allow him to get hemodialysis quickly without the additional stress of moderate sedation. We can then convert the non tunneled catheter to a tunneled device once his other issues have been addressed. EXAM: IR RIGHT FLOURO GUIDE CV LINE; IR ULTRASOUND GUIDANCE VASC ACCESS RIGHT MEDICATIONS: None ANESTHESIA/SEDATION: None FLUOROSCOPY TIME:  Fluoroscopy Time: 0 minutes 12 seconds (1 mGy). COMPLICATIONS: None immediate. PROCEDURE:  Informed written consent was obtained from the patient after a thorough discussion of the procedural risks, benefits and alternatives. All questions were addressed. Maximal Sterile Barrier Technique was utilized including caps, mask, sterile gowns, sterile gloves, sterile drape, hand hygiene and skin antiseptic. A timeout was performed prior to the initiation of the procedure. The right internal jugular vein was interrogated with ultrasound and found to be widely patent. An image was obtained and stored for the medical record. Local anesthesia was attained by infiltration with 1% lidocaine. A small dermatotomy was made. Under real-time sonographic guidance, the vessel was punctured with an 18 gauge needle. A 0.035 wire was advanced into the inferior vena cava. The needle was  removed. The skin tract was dilated and a 16 cm triple-lumen hemodialysis catheter was advanced over the wire and positioned with the catheter tip at the superior cavoatrial junction. The catheter was aspirated and then flushed with heparinized saline. The catheter was then secured to the skin with 0 Prolene suture and a sterile bandage. The patient tolerated the procedure well. IMPRESSION: Successful placement of a non tunneled hemodialysis catheter via the right internal jugular vein. The tip of the catheter is at the superior cavoatrial junction and the catheter is ready for immediate use. Electronically Signed   By: Jacqulynn Cadet M.D.   On: 01/16/2018 14:05   Ir US Guide Vasc Access Right  Result Date: 01/31/2018 CLINICAL DATA:  End-stage renal disease, needs durable venous access for hemodialysis. A temporary right IJ hemodialysis catheter was placed 01/16/2018. EXAM: TUNNELED HEMODIALYSIS CATHETER PLACEMENT WITH ULTRASOUND AND FLUOROSCOPIC GUIDANCE TECHNIQUE: The procedure, risks, benefits, and alternatives were explained to the patient. Questions regarding the procedure were encouraged and answered. The patient understands and consents to the procedure. As antibiotic prophylaxis, cefazolin 2 g was ordered pre-procedure and administered intravenously within one hour of incision. Patency of a good-sized right external jugular vein was confirmed with ultrasound with image documentation. An appropriate skin site was determined. Region was prepped using maximum barrier technique including cap and mask, sterile gown, sterile gloves, large sterile sheet, and Chlorhexidine as cutaneous antisepsis. The region was infiltrated locally with 1% lidocaine. Intravenous Fentanyl and Versed were administered as conscious sedation during continuous monitoring of the patient's level of consciousness and physiological / cardiorespiratory status by the radiology RN, with a total moderate sedation time of 17 minutes. Under  real-time ultrasound guidance, the right EJ vein was accessed with a 21 gauge micropuncture needle; the needle tip within the vein was confirmed with ultrasound image documentation. Needle exchanged over the 018 guidewire for transitional dilator, which allowed advancement of a Benson wire into the IVC. Over this, an MPA catheter was advanced. A Palindrome 23 hemodialysis catheter was tunneled from the right anterior chest wall approach to the right EJ dermatotomy site. The MPA catheter was exchanged over an Amplatz wire for serial vascular dilators which allow placement of a peel-away sheath, through which the catheter was advanced under intermittent fluoroscopy, positioned with its tips in the proximal and midright atrium. Spot chest radiograph confirms good catheter position. No pneumothorax. Catheter was flushed and primed per protocol. Catheter secured externally with O Prolene sutures. The right EJ dermatotomy site was closed with Dermabond. COMPLICATIONS: COMPLICATIONS None immediate FLUOROSCOPY TIME:  48 seconds; 2 mGy COMPARISON:  None IMPRESSION: 1. Technically successful placement of tunneled right EJ hemodialysis catheter with ultrasound and fluoroscopic guidance. Ready for routine use. ACCESS: Remains approachable for percutaneous intervention as needed. Electronically Signed   By: Keturah Barre  Vernard Gambles M.D.   On: 01/31/2018 16:54   Ir US Guide Vasc Access Right  Result Date: 01/16/2018 INDICATION: 78 year old male with acute renal failure in need of hemodialysis. We had an initial plan for placement of a tunneled hemodialysis catheter, however he has leukocytosis today as well as new onset atrial fibrillation with uncontrolled rapid ventricular response. Therefore, we will place a non tunneled hemodialysis catheter to allow him to get hemodialysis quickly without the additional stress of moderate sedation. We can then convert the non tunneled catheter to a tunneled device once his other issues have been  addressed. EXAM: IR RIGHT FLOURO GUIDE CV LINE; IR ULTRASOUND GUIDANCE VASC ACCESS RIGHT MEDICATIONS: None ANESTHESIA/SEDATION: None FLUOROSCOPY TIME:  Fluoroscopy Time: 0 minutes 12 seconds (1 mGy). COMPLICATIONS: None immediate. PROCEDURE: Informed written consent was obtained from the patient after a thorough discussion of the procedural risks, benefits and alternatives. All questions were addressed. Maximal Sterile Barrier Technique was utilized including caps, mask, sterile gowns, sterile gloves, sterile drape, hand hygiene and skin antiseptic. A timeout was performed prior to the initiation of the procedure. The right internal jugular vein was interrogated with ultrasound and found to be widely patent. An image was obtained and stored for the medical record. Local anesthesia was attained by infiltration with 1% lidocaine. A small dermatotomy was made. Under real-time sonographic guidance, the vessel was punctured with an 18 gauge needle. A 0.035 wire was advanced into the inferior vena cava. The needle was removed. The skin tract was dilated and a 16 cm triple-lumen hemodialysis catheter was advanced over the wire and positioned with the catheter tip at the superior cavoatrial junction. The catheter was aspirated and then flushed with heparinized saline. The catheter was then secured to the skin with 0 Prolene suture and a sterile bandage. The patient tolerated the procedure well. IMPRESSION: Successful placement of a non tunneled hemodialysis catheter via the right internal jugular vein. The tip of the catheter is at the superior cavoatrial junction and the catheter is ready for immediate use. Electronically Signed   By: Jacqulynn Cadet M.D.   On: 01/16/2018 14:05   US Biopsy (kidney)  Result Date: 01/25/2018 INDICATION: 78 year old with acute on chronic renal failure. Request for renal biopsy. EXAM: ULTRASOUND-GUIDED RIGHT RENAL BIOPSY MEDICATIONS: None. ANESTHESIA/SEDATION: Moderate (conscious)  sedation was employed during this procedure. A total of Versed 1.0 mg and Fentanyl 50 mcg was administered intravenously. Moderate Sedation Time: 11 minutes. The patient's level of consciousness and vital signs were monitored continuously by radiology nursing throughout the procedure under my direct supervision. FLUOROSCOPY TIME:  None COMPLICATIONS: None immediate. PROCEDURE: Informed written consent was obtained from the patient after a thorough discussion of the procedural risks, benefits and alternatives. All questions were addressed. Both kidneys were evaluated with ultrasound. The right kidney was targeted for biopsy. Right flank was prepped with chlorhexidine and sterile field was created. Maximal barrier sterile technique was utilized including mask, sterile gowns, sterile gloves, sterile drape, hand hygiene and skin antiseptic. Skin was anesthetized with 1% lidocaine. 62 gauge core biopsy needle was directed into the right kidney lower pole with ultrasound guidance. Two core biopsies were obtained. Core biopsies appeared adequate and placed in saline. Bandage placed over the puncture site. FINDINGS: Negative for hydronephrosis. Core biopsies obtained from the right kidney lower pole. No significant bleeding or hematoma formation at the end of the procedure. IMPRESSION: Ultrasound-guided core biopsies of the right kidney lower pole. Electronically Signed   By: Scherrie Gerlach.D.  On: 01/25/2018 17:13     TODAY-DAY OF DISCHARGE:  Subjective:   Andrew Guerra today has no headache,no chest abdominal pain,no new weakness tingling or numbness, feels much better wants to go home today.   Objective:   Blood pressure (!) 190/78, pulse 69, temperature 98 F (36.7 C), temperature source Oral, resp. rate 16, height 5' 10.5" (1.791 m), weight 82.9 kg, SpO2 95 %.  Intake/Output Summary (Last 24 hours) at 02/09/2018 0855 Last data filed at 02/08/2018 2245 Gross per 24 hour  Intake 653 ml  Output 775 ml  Net  -122 ml   Filed Weights   02/05/18 0543 02/07/18 0500 02/08/18 0900  Weight: 85.6 kg 83.9 kg 82.9 kg    Exam: Awake Alert, Oriented *3, No new F.N deficits, Normal affect [place].AT,PERRAL Supple Neck,No JVD, No cervical lymphadenopathy appriciated.  Symmetrical Chest wall movement, Good air movement bilaterally, CTAB RRR,No Gallops,Rubs or new Murmurs, No Parasternal Heave +ve B.Sounds, Abd Soft, Non tender, No organomegaly appriciated, No rebound -guarding or rigidity. No Cyanosis, Clubbing or edema, No new Rash or bruise   PERTINENT RADIOLOGIC STUDIES: Dg Chest 2 View  Result Date: 02/03/2018 CLINICAL DATA:  Dyspnea. EXAM: CHEST - 2 VIEW COMPARISON:  Radiographs of January 29, 2018. FINDINGS: Stable cardiomediastinal silhouette. No pneumothorax is noted. Right internal jugular dialysis catheter is unchanged in position. Stable right basilar atelectasis or infiltrate is noted, with minimal left basilar subsegmental atelectasis. Bilateral pleural effusions are noted with right greater than left. Bony thorax is unremarkable. IMPRESSION: Stable right basilar atelectasis or infiltrate is noted with associated pleural effusion. Minimal left basilar atelectasis is also noted with minimal left pleural effusion. Electronically Signed   By: Marijo Conception, M.D.   On: 02/03/2018 09:07   Dg Chest 2 View  Result Date: 01/29/2018 CLINICAL DATA:  Patient admitted for leg swelling EXAM: CHEST - 2 VIEW COMPARISON:  Chest radiograph 01/14/2018 FINDINGS: Right anterior chest wall Port-A-Cath is present with tip projecting over the superior vena cava. Monitoring leads overlie the patient. Stable cardiomegaly. Low lung volumes. Bibasilar heterogeneous opacities, right greater than left. Small bilateral pleural effusions. Regional skeleton is unremarkable. IMPRESSION: Low lung volumes with small bilateral effusions and underlying opacities, similar to prior. Possibility of mass within the right lower lung not  excluded. Electronically Signed   By: Lovey Newcomer M.D.   On: 01/29/2018 11:07   Dg Chest 2 View  Result Date: 01/14/2018 CLINICAL DATA:  Shortness of breath EXAM: CHEST - 2 VIEW COMPARISON:  Chest radiograph 01/09/2018 FINDINGS: Monitoring leads overlie the patient. Stable cardiac and mediastinal contours. Similar-appearing patchy consolidation within the right greater than left lower lungs. Moderate right and small left pleural effusions. No pneumothorax. IMPRESSION: Similar-appearing patchy consolidation within the right greater than left lower lungs with moderate right and small left pleural effusions. Electronically Signed   By: Lovey Newcomer M.D.   On: 01/14/2018 16:08   Mr Brain Wo Contrast  Result Date: 01/29/2018 CLINICAL DATA:  Altered level of consciousness. Chronic kidney disease and anemia. EXAM: MRI HEAD WITHOUT CONTRAST TECHNIQUE: Multiplanar, multiecho pulse sequences of the brain and surrounding structures were obtained without intravenous contrast. COMPARISON:  None. FINDINGS: Brain: The study is mildly degraded by patient motion. No acute infarct, hemorrhage, or mass lesion is present. Periventricular and subcortical white matter changes are mildly advanced for age. Mild generalized atrophy is noted. 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 White matter changes extend into the brainstem. A remote lacunar infarct is  present in the right cerebellum. The internal auditory canals are within normal limits. The ventricles are of normal size. No significant extraaxial fluid collection is present. Vascular: Flow is present in the major intracranial arteries. Skull and upper cervical spine: Craniocervical junction is normal. Decreased marrow signal is present in the upper cervical spine, compatible with anemia and kidney failure. Vertebral body heights are maintained. Degenerative changes are noted. Sinuses/Orbits: The paranasal sinuses and mastoid air cells are clear. A right  lens replacement is present. Globes and orbits are otherwise within normal limits. IMPRESSION: 1. No acute intracranial abnormality to explain altered level of consciousness. 2. Atrophy and diffuse white matter disease is mildly advanced for age. This likely reflects the sequela of chronic microvascular ischemia in this patient with chronic kidney disease. 3. 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 4. Decreased marrow signal most consistent with known anemia. Electronically Signed   By: San Morelle M.D.   On: 01/29/2018 11:45   US Renal  Result Date: 01/13/2018 CLINICAL DATA:  Oliguria.  Elevated PSA. EXAM: RENAL / URINARY TRACT ULTRASOUND COMPLETE COMPARISON:  CT abdomen and pelvis 01/09/2018. FINDINGS: Right Kidney: Renal measurements: 10.5 x 4.6 x 5.0 cm = volume: 126.8 mL . Echogenicity within normal limits. A simple cyst measuring 1.9 cm in diameter is identified. No hydronephrosis visualized. Trace amount of perihepatic ascites is noted. Left Kidney: Renal measurements: 11.4 x 5.5 x 5.1 cm = volume: 163.2 mL. Echogenicity within normal limits. No mass or hydronephrosis visualized. Bladder: The bladder is almost completely decompressed. No focal abnormality. Prostatomegaly is seen. IMPRESSION: Negative for hydronephrosis or acute abnormality. Prostatomegaly. Trace amount of ascites. Electronically Signed   By: Inge Rise M.D.   On: 01/13/2018 11:29   Ir Fluoro Guide Cv Line Right  Result Date: 01/31/2018 CLINICAL DATA:  End-stage renal disease, needs durable venous access for hemodialysis. A temporary right IJ hemodialysis catheter was placed 01/16/2018. EXAM: TUNNELED HEMODIALYSIS CATHETER PLACEMENT WITH ULTRASOUND AND FLUOROSCOPIC GUIDANCE TECHNIQUE: The procedure, risks, benefits, and alternatives were explained to the patient. Questions regarding the procedure were encouraged and answered. The patient understands and consents to the  procedure. As antibiotic prophylaxis, cefazolin 2 g was ordered pre-procedure and administered intravenously within one hour of incision. Patency of a good-sized right external jugular vein was confirmed with ultrasound with image documentation. An appropriate skin site was determined. Region was prepped using maximum barrier technique including cap and mask, sterile gown, sterile gloves, large sterile sheet, and Chlorhexidine as cutaneous antisepsis. The region was infiltrated locally with 1% lidocaine. Intravenous Fentanyl and Versed were administered as conscious sedation during continuous monitoring of the patient's level of consciousness and physiological / cardiorespiratory status by the radiology RN, with a total moderate sedation time of 17 minutes. Under real-time ultrasound guidance, the right EJ vein was accessed with a 21 gauge micropuncture needle; the needle tip within the vein was confirmed with ultrasound image documentation. Needle exchanged over the 018 guidewire for transitional dilator, which allowed advancement of a Benson wire into the IVC. Over this, an MPA catheter was advanced. A Palindrome 23 hemodialysis catheter was tunneled from the right anterior chest wall approach to the right EJ dermatotomy site. The MPA catheter was exchanged over an Amplatz wire for serial vascular dilators which allow placement of a peel-away sheath, through which the catheter was advanced under intermittent fluoroscopy, positioned with its tips in the proximal and midright atrium. Spot chest radiograph confirms good catheter position. No pneumothorax. Catheter  was flushed and primed per protocol. Catheter secured externally with O Prolene sutures. The right EJ dermatotomy site was closed with Dermabond. COMPLICATIONS: COMPLICATIONS None immediate FLUOROSCOPY TIME:  48 seconds; 2 mGy COMPARISON:  None IMPRESSION: 1. Technically successful placement of tunneled right EJ hemodialysis catheter with ultrasound and  fluoroscopic guidance. Ready for routine use. ACCESS: Remains approachable for percutaneous intervention as needed. Electronically Signed   By: Lucrezia Europe M.D.   On: 01/31/2018 16:54   Ir Fluoro Guide Cv Line Right  Result Date: 01/16/2018 INDICATION: 78 year old male with acute renal failure in need of hemodialysis. We had an initial plan for placement of a tunneled hemodialysis catheter, however he has leukocytosis today as well as new onset atrial fibrillation with uncontrolled rapid ventricular response. Therefore, we will place a non tunneled hemodialysis catheter to allow him to get hemodialysis quickly without the additional stress of moderate sedation. We can then convert the non tunneled catheter to a tunneled device once his other issues have been addressed. EXAM: IR RIGHT FLOURO GUIDE CV LINE; IR ULTRASOUND GUIDANCE VASC ACCESS RIGHT MEDICATIONS: None ANESTHESIA/SEDATION: None FLUOROSCOPY TIME:  Fluoroscopy Time: 0 minutes 12 seconds (1 mGy). COMPLICATIONS: None immediate. PROCEDURE: Informed written consent was obtained from the patient after a thorough discussion of the procedural risks, benefits and alternatives. All questions were addressed. Maximal Sterile Barrier Technique was utilized including caps, mask, sterile gowns, sterile gloves, sterile drape, hand hygiene and skin antiseptic. A timeout was performed prior to the initiation of the procedure. The right internal jugular vein was interrogated with ultrasound and found to be widely patent. An image was obtained and stored for the medical record. Local anesthesia was attained by infiltration with 1% lidocaine. A small dermatotomy was made. Under real-time sonographic guidance, the vessel was punctured with an 18 gauge needle. A 0.035 wire was advanced into the inferior vena cava. The needle was removed. The skin tract was dilated and a 16 cm triple-lumen hemodialysis catheter was advanced over the wire and positioned with the catheter tip  at the superior cavoatrial junction. The catheter was aspirated and then flushed with heparinized saline. The catheter was then secured to the skin with 0 Prolene suture and a sterile bandage. The patient tolerated the procedure well. IMPRESSION: Successful placement of a non tunneled hemodialysis catheter via the right internal jugular vein. The tip of the catheter is at the superior cavoatrial junction and the catheter is ready for immediate use. Electronically Signed   By: Jacqulynn Cadet M.D.   On: 01/16/2018 14:05   Ir US Guide Vasc Access Right  Result Date: 01/31/2018 CLINICAL DATA:  End-stage renal disease, needs durable venous access for hemodialysis. A temporary right IJ hemodialysis catheter was placed 01/16/2018. EXAM: TUNNELED HEMODIALYSIS CATHETER PLACEMENT WITH ULTRASOUND AND FLUOROSCOPIC GUIDANCE TECHNIQUE: The procedure, risks, benefits, and alternatives were explained to the patient. Questions regarding the procedure were encouraged and answered. The patient understands and consents to the procedure. As antibiotic prophylaxis, cefazolin 2 g was ordered pre-procedure and administered intravenously within one hour of incision. Patency of a good-sized right external jugular vein was confirmed with ultrasound with image documentation. An appropriate skin site was determined. Region was prepped using maximum barrier technique including cap and mask, sterile gown, sterile gloves, large sterile sheet, and Chlorhexidine as cutaneous antisepsis. The region was infiltrated locally with 1% lidocaine. Intravenous Fentanyl and Versed were administered as conscious sedation during continuous monitoring of the patient's level of consciousness and physiological / cardiorespiratory status by the radiology  RN, with a total moderate sedation time of 17 minutes. Under real-time ultrasound guidance, the right EJ vein was accessed with a 21 gauge micropuncture needle; the needle tip within the vein was confirmed  with ultrasound image documentation. Needle exchanged over the 018 guidewire for transitional dilator, which allowed advancement of a Benson wire into the IVC. Over this, an MPA catheter was advanced. A Palindrome 23 hemodialysis catheter was tunneled from the right anterior chest wall approach to the right EJ dermatotomy site. The MPA catheter was exchanged over an Amplatz wire for serial vascular dilators which allow placement of a peel-away sheath, through which the catheter was advanced under intermittent fluoroscopy, positioned with its tips in the proximal and midright atrium. Spot chest radiograph confirms good catheter position. No pneumothorax. Catheter was flushed and primed per protocol. Catheter secured externally with O Prolene sutures. The right EJ dermatotomy site was closed with Dermabond. COMPLICATIONS: COMPLICATIONS None immediate FLUOROSCOPY TIME:  48 seconds; 2 mGy COMPARISON:  None IMPRESSION: 1. Technically successful placement of tunneled right EJ hemodialysis catheter with ultrasound and fluoroscopic guidance. Ready for routine use. ACCESS: Remains approachable for percutaneous intervention as needed. Electronically Signed   By: Lucrezia Europe M.D.   On: 01/31/2018 16:54   Ir US Guide Vasc Access Right  Result Date: 01/16/2018 INDICATION: 78 year old male with acute renal failure in need of hemodialysis. We had an initial plan for placement of a tunneled hemodialysis catheter, however he has leukocytosis today as well as new onset atrial fibrillation with uncontrolled rapid ventricular response. Therefore, we will place a non tunneled hemodialysis catheter to allow him to get hemodialysis quickly without the additional stress of moderate sedation. We can then convert the non tunneled catheter to a tunneled device once his other issues have been addressed. EXAM: IR RIGHT FLOURO GUIDE CV LINE; IR ULTRASOUND GUIDANCE VASC ACCESS RIGHT MEDICATIONS: None ANESTHESIA/SEDATION: None FLUOROSCOPY  TIME:  Fluoroscopy Time: 0 minutes 12 seconds (1 mGy). COMPLICATIONS: None immediate. PROCEDURE: Informed written consent was obtained from the patient after a thorough discussion of the procedural risks, benefits and alternatives. All questions were addressed. Maximal Sterile Barrier Technique was utilized including caps, mask, sterile gowns, sterile gloves, sterile drape, hand hygiene and skin antiseptic. A timeout was performed prior to the initiation of the procedure. The right internal jugular vein was interrogated with ultrasound and found to be widely patent. An image was obtained and stored for the medical record. Local anesthesia was attained by infiltration with 1% lidocaine. A small dermatotomy was made. Under real-time sonographic guidance, the vessel was punctured with an 18 gauge needle. A 0.035 wire was advanced into the inferior vena cava. The needle was removed. The skin tract was dilated and a 16 cm triple-lumen hemodialysis catheter was advanced over the wire and positioned with the catheter tip at the superior cavoatrial junction. The catheter was aspirated and then flushed with heparinized saline. The catheter was then secured to the skin with 0 Prolene suture and a sterile bandage. The patient tolerated the procedure well. IMPRESSION: Successful placement of a non tunneled hemodialysis catheter via the right internal jugular vein. The tip of the catheter is at the superior cavoatrial junction and the catheter is ready for immediate use. Electronically Signed   By: Jacqulynn Cadet M.D.   On: 01/16/2018 14:05   US Biopsy (kidney)  Result Date: 01/25/2018 INDICATION: 78 year old with acute on chronic renal failure. Request for renal biopsy. EXAM: ULTRASOUND-GUIDED RIGHT RENAL BIOPSY MEDICATIONS: None. ANESTHESIA/SEDATION: Moderate (conscious) sedation was  employed during this procedure. A total of Versed 1.0 mg and Fentanyl 50 mcg was administered intravenously. Moderate Sedation Time: 11  minutes. The patient's level of consciousness and vital signs were monitored continuously by radiology nursing throughout the procedure under my direct supervision. FLUOROSCOPY TIME:  None COMPLICATIONS: None immediate. PROCEDURE: Informed written consent was obtained from the patient after a thorough discussion of the procedural risks, benefits and alternatives. All questions were addressed. Both kidneys were evaluated with ultrasound. The right kidney was targeted for biopsy. Right flank was prepped with chlorhexidine and sterile field was created. Maximal barrier sterile technique was utilized including mask, sterile gowns, sterile gloves, sterile drape, hand hygiene and skin antiseptic. Skin was anesthetized with 1% lidocaine. 25 gauge core biopsy needle was directed into the right kidney lower pole with ultrasound guidance. Two core biopsies were obtained. Core biopsies appeared adequate and placed in saline. Bandage placed over the puncture site. FINDINGS: Negative for hydronephrosis. Core biopsies obtained from the right kidney lower pole. No significant bleeding or hematoma formation at the end of the procedure. IMPRESSION: Ultrasound-guided core biopsies of the right kidney lower pole. Electronically Signed   By: Markus Daft M.D.   On: 01/25/2018 17:13     PERTINENT LAB RESULTS: CBC: Recent Labs    02/07/18 0527 02/08/18 0457  WBC 10.1 10.6*  HGB 7.9* 8.4*  HCT 25.3* 27.0*  PLT 311 326   CMET CMP     Component Value Date/Time   NA 138 02/09/2018 0413   NA 141 11/18/2017 1145   K 2.8 (L) 02/09/2018 0413   CL 102 02/09/2018 0413   CO2 26 02/09/2018 0413   GLUCOSE 85 02/09/2018 0413   BUN 85 (H) 02/09/2018 0413   BUN 33 (H) 11/18/2017 1145   CREATININE 4.61 (H) 02/09/2018 0413   CREATININE 1.33 04/09/2013 1639   CALCIUM 8.1 (L) 02/09/2018 0413   CALCIUM 8.0 (L) 01/31/2018 0747   PROT 4.6 (L) 01/21/2018 0509   PROT 5.3 (L) 09/05/2017 0918   ALBUMIN 1.9 (L) 02/09/2018 0413    ALBUMIN 3.2 (L) 09/05/2017 0918   AST 24 01/21/2018 0509   ALT 30 01/21/2018 0509   ALKPHOS 74 01/21/2018 0509   BILITOT <0.1 (L) 01/21/2018 0509   BILITOT 0.2 09/05/2017 0918   GFRNONAA 11 (L) 02/09/2018 0413   GFRAA 13 (L) 02/09/2018 0413    GFR Estimated Creatinine Clearance: 13.6 mL/min (A) (by C-G formula based on SCr of 4.61 mg/dL (H)). No results for input(s): LIPASE, AMYLASE in the last 72 hours. No results for input(s): CKTOTAL, CKMB, CKMBINDEX, TROPONINI in the last 72 hours. Invalid input(s): POCBNP No results for input(s): DDIMER in the last 72 hours. No results for input(s): HGBA1C in the last 72 hours. No results for input(s): CHOL, HDL, LDLCALC, TRIG, CHOLHDL, LDLDIRECT in the last 72 hours. No results for input(s): TSH, T4TOTAL, T3FREE, THYROIDAB in the last 72 hours.  Invalid input(s): FREET3 No results for input(s): VITAMINB12, FOLATE, FERRITIN, TIBC, IRON, RETICCTPCT in the last 72 hours. Coags: No results for input(s): INR in the last 72 hours.  Invalid input(s): PT Microbiology: No results found for this or any previous visit (from the past 240 hour(s)).  FURTHER DISCHARGE INSTRUCTIONS:  Get Medicines reviewed and adjusted: Please take all your medications with you for your next visit with your Primary MD  Laboratory/radiological data: Please request your Primary MD to go over all hospital tests and procedure/radiological results at the follow up, please ask your Primary MD to  get all Hospital records sent to his/her office.  In some cases, they will be blood work, cultures and biopsy results pending at the time of your discharge. Please request that your primary care M.D. goes through all the records of your hospital data and follows up on these results.  Also Note the following: If you experience worsening of your admission symptoms, develop shortness of breath, life threatening emergency, suicidal or homicidal thoughts you must seek medical attention  immediately by calling 911 or calling your MD immediately  if symptoms less severe.  You must read complete instructions/literature along with all the possible adverse reactions/side effects for all the Medicines you take and that have been prescribed to you. Take any new Medicines after you have completely understood and accpet all the possible adverse reactions/side effects.   Do not drive when taking Pain medications or sleeping medications (Benzodaizepines)  Do not take more than prescribed Pain, Sleep and Anxiety Medications. It is not advisable to combine anxiety,sleep and pain medications without talking with your primary care practitioner  Special Instructions: If you have smoked or chewed Tobacco  in the last 2 yrs please stop smoking, stop any regular Alcohol  and or any Recreational drug use.  Wear Seat belts while driving.  Please note: You were cared for by a hospitalist during your hospital stay. Once you are discharged, your primary care physician will handle any further medical issues. Please note that NO REFILLS for any discharge medications will be authorized once you are discharged, as it is imperative that you return to your primary care physician (or establish a relationship with a primary care physician if you do not have one) for your post hospital discharge needs so that they can reassess your need for medications and monitor your lab values.  Total Time spent coordinating discharge including counseling, education and face to face time equals 35 minutes.  SignedOren Binet 02/09/2018 8:55 AM

## 2018-02-09 NOTE — Progress Notes (Signed)
SATURATION QUALIFICATIONS: (This note is used to comply with regulatory documentation for home oxygen)  Patient Saturations on Room Air at Rest = 86%  Patient Saturations on Room Air while Ambulating = 79%  Patient Saturations on 2 Liters of oxygen while Ambulating = 92%  Please briefly explain why patient needs home oxygen:

## 2018-02-09 NOTE — Progress Notes (Signed)
Physical Therapy Treatment Patient Details Name: Andrew Guerra MRN: [id number] DOB: 01-15-1941 Today's Date: 02/09/2018    History of Present Illness Andrew Guerra is a 78yo male who comes to [facility] on 01/09/18 after noted Hb: 6.5 at his PCP's office. Pt has subsequent kidney failure while admitted requiring HD, now with tunneled EJ cath (1/14). Pt also has kidney biopsy on 1/8, cardia catheterization, and a fall on 1/11 (ambien related per RN). Pt now being treated for PNA.     PT Comments    Pt making steady progress with functional mobility. Pt on 3L of O2 throughout with SPO2 maintaining at 91-94%. PT demonstrated and instructed pt on adjusting height of rollator handles as pt's rollator was already delivered to his room. Plan is for pt to d/c home today with spouse. Pt would continue to benefit from skilled physical therapy services at this time while admitted and after d/c to address the below listed limitations in order to improve overall safety and independence with functional mobility.    Follow Up Recommendations  Outpatient PT;Supervision for mobility/OOB     Equipment Recommendations  Rolling walker with 5" wheels    Recommendations for Other Services       Precautions / Restrictions Precautions Precautions: Fall Precaution Comments: monitor SPO2 Restrictions Weight Bearing Restrictions: No    Mobility  Bed Mobility               General bed mobility comments: pt seated EOB upon arrival  Transfers Overall transfer level: Needs assistance Equipment used: Rolling walker (2 wheeled) Transfers: Sit to/from Stand Sit to Stand: Supervision         General transfer comment: supervision for safety  Ambulation/Gait Ambulation/Gait assistance: Supervision Gait Distance (Feet): 150 Feet Assistive device: Rolling walker (2 wheeled) Gait Pattern/deviations: Step-through pattern Gait velocity: decreased   General Gait Details: pt steady with RW throughout, no  LOB or need for rest breaks, supervision for safety and O2 managment. Pt on 3L of O2 throughout with SPO2 maintaining at 91-94%   Stairs             Wheelchair Mobility    Modified Rankin (Stroke Patients Only)       Balance Overall balance assessment: Needs assistance Sitting-balance support: Feet supported Sitting balance-Leahy Scale: Good     Standing balance support: During functional activity Standing balance-Leahy Scale: Fair                              Cognition Arousal/Alertness: Awake/alert Behavior During Therapy: WFL for tasks assessed/performed Overall Cognitive Status: Within Functional Limits for tasks assessed                                        Exercises      General Comments        Pertinent Vitals/Pain Pain Assessment: Faces Faces Pain Scale: Hurts a little bit Pain Location: lower back Pain Descriptors / Indicators: Sore;Tightness Pain Intervention(s): Monitored during session    Home Living                      Prior Function            PT Goals (current goals can now be found in the care plan section) Acute Rehab PT Goals PT Goal Formulation: With patient Time For Goal  Achievement: 02/08/18 Potential to Achieve Goals: Good Progress towards PT goals: Progressing toward goals    Frequency    Min 3X/week      PT Plan Current plan remains appropriate    Co-evaluation              AM-PAC PT "6 Clicks" Mobility   Outcome Measure  Help needed turning from your back to your side while in a flat bed without using bedrails?: None Help needed moving from lying on your back to sitting on the side of a flat bed without using bedrails?: None Help needed moving to and from a bed to a chair (including a wheelchair)?: None Help needed standing up from a chair using your arms (e.g., wheelchair or bedside chair)?: None Help needed to walk in hospital room?: None Help needed climbing 3-5  steps with a railing? : A Little 6 Click Score: 23    End of Session Equipment Utilized During Treatment: Oxygen(3L) Activity Tolerance: Patient tolerated treatment well Patient left: in chair;with call bell/phone within reach;with chair alarm set Nurse Communication: Mobility status PT Visit Diagnosis: Other abnormalities of gait and mobility (R26.89);Muscle weakness (generalized) (M62.81)     Time: 5391-2258 PT Time Calculation (min) (ACUTE ONLY): 14 min  Charges:  $Gait Training: 8-22 mins                     Sherie Don, [place], DPT  Acute Rehabilitation Services Pager [phone] Office [place] 02/09/2018, 10:54 AM

## 2018-02-09 NOTE — Progress Notes (Signed)
Pt given discharge instructions with understanding. Pt wife at bedside. Pt has no questions at this time. Prescriptions given home o2 at bedside.

## 2018-02-09 NOTE — Care Management Note (Signed)
Case Management Note  Patient Details  Name: Andrew Guerra MRN: [id number] Date of Birth: [birth year]  Subjective/Objective:        Admitted with GI bleed.  Hx of hypertension, hyperlipidemia, bicuspid aortic valve / aortic stenosis, NSCLC status post resection and radiation, hypertension and PUD/duodenal and gastric ulcers. From home with wife. PTA independent with ADL's, no DME usage per pt.     RAMSES KLECKA (Spouse)     [phone]                 PCP: Sallee Lange  Action/Plan: Transition to home with today with outpatient PT F/U. Pt states will f/u with [facility] regarding outpatient PT. Portable oxygen tank will be delivered to bedside prior to d/c for transportation to home.   Pt has transportation to home.  Expected Discharge Date:  02/09/18               Expected Discharge Plan:  [place]  In-House Referral:  NA  Discharge planning Services  CM Consult  Post Acute Care Choice:  NA Choice offered to:  Patient  DME Arranged:  Walker rolling with seat, Oxygen DME Agency:  [place]:  NA HH Agency:  NA  Status of Service:  Completed, signed off  If discussed at [place] of Stay Meetings, dates discussed:    Additional Comments:  Sharin Mons, RN 02/09/2018, 9:40 AM

## 2018-02-10 ENCOUNTER — Ambulatory Visit: Payer: Medicare Other | Admitting: Cardiology

## 2018-02-10 ENCOUNTER — Telehealth: Payer: Self-pay | Admitting: Family Medicine

## 2018-02-10 NOTE — Telephone Encounter (Signed)
I would recommend not to be on HCTZ I reviewed over the other medicines that the hospital had on his discharge summary and I agree with these I do recommend that the patient follow-up on Monday If possible they can bring his medications with him We will be happy to answer further questions at that time

## 2018-02-10 NOTE — Telephone Encounter (Signed)
Pt was discharged from hospital and given a list of medications to take and a list to discontinue.   hydrochlorothiazide (HYDRODIURIL) 25 MG tablet  Is not on either list and they would like to know if he should be taking this. Pt has hospital follow up on Monday with Dr. Nicki Reaper.

## 2018-02-10 NOTE — Telephone Encounter (Signed)
Wife(DPR) advised Dr Nicki Reaper would recommend not to be on HCTZ Dr Nicki Reaper reviewed over the other medicines that the hospital had on his discharge summary and he agrees with these Dr Nicki Reaper does recommend that the patient follow-up on Monday If possible they can bring his medications with him We will be happy to answer further questions at that time Wife verbalized understanding.

## 2018-02-13 ENCOUNTER — Encounter: Payer: Self-pay | Admitting: Family Medicine

## 2018-02-13 ENCOUNTER — Ambulatory Visit: Payer: Medicare Other | Admitting: Family Medicine

## 2018-02-13 VITALS — BP 146/74 | Ht 70.5 in | Wt 186.0 lb

## 2018-02-13 DIAGNOSIS — N183 Chronic kidney disease, stage 3 unspecified: Secondary | ICD-10-CM

## 2018-02-13 DIAGNOSIS — D500 Iron deficiency anemia secondary to blood loss (chronic): Secondary | ICD-10-CM | POA: Diagnosis not present

## 2018-02-13 DIAGNOSIS — I10 Essential (primary) hypertension: Secondary | ICD-10-CM | POA: Diagnosis not present

## 2018-02-13 NOTE — Progress Notes (Signed)
Subjective:    Patient ID: Andrew Guerra, male    DOB: 1940-04-09, 78 y.o.   MRN: [id number]  HPIpt following up from hospital. Pt states he is feeling better.  Very nice patient Complex patient I went over his labs procedures testing from the hospitalization This patient has persistent ongoing breathing difficulties with shortness of breath with activity Denies PND orthopnea Patient's appetite start to pick up but he is careful with what he eats what he drinks He was undergoing dialysis while in the hospital but not currently Patient also had significant anemia issues from GI bleed and will need follow-up on lab work He has a follow-up appointment with cardiology for CHF He has a follow-up appointment with nephrology for renal failure He will also need to be followed here closely. Patient is dependent upon his oxygen currently without it he gets very short of breath  Using 2.5 liters of oxygen now. o2 95 % with oxygen. Without oxygen 82%.  This was here in the office.  95% with 2.5 L was at rest.  Without oxygen after 5 minutes it dropped to 82%      Review of Systems  Constitutional: Positive for unexpected weight change. Negative for diaphoresis and fatigue.  HENT: Negative for congestion and rhinorrhea.   Respiratory: Positive for shortness of breath. Negative for cough.   Cardiovascular: Positive for leg swelling. Negative for chest pain.  Gastrointestinal: Negative for abdominal pain and diarrhea.  Musculoskeletal: Negative for arthralgias and back pain.  Skin: Negative for color change and rash.  Neurological: Positive for weakness. Negative for dizziness, numbness and headaches.  Psychiatric/Behavioral: Negative for behavioral problems and confusion.       Objective:   Physical Exam Vitals signs reviewed.  Constitutional:      General: He is not in acute distress. HENT:     Head: Normocephalic and atraumatic.  Eyes:     General:        Right eye: No discharge.         Left eye: No discharge.  Neck:     Trachea: No tracheal deviation.  Cardiovascular:     Rate and Rhythm: Normal rate and regular rhythm.     Heart sounds: Normal heart sounds. No murmur.  Pulmonary:     Effort: Pulmonary effort is normal. No respiratory distress.     Breath sounds: Normal breath sounds.  Musculoskeletal:        General: Swelling present. No tenderness or signs of injury.  Lymphadenopathy:     Cervical: No cervical adenopathy.  Skin:    General: Skin is warm and dry.  Neurological:     Mental Status: He is alert.     Coordination: Coordination normal.  Psychiatric:        Behavior: Behavior normal.    Patient has visible weight loss including muscle weight loss from his long hospitalization stay       Assessment & Plan:  Complicated hospital follow-up Hypoxia more than likely related to deconditioning and COPD O2 saturation 82% on room air with 2.5 L it is 94%  Renal failure hopefully this is improving he is getting good urinary output with the torsemide we will check a metabolic 7 then touch base with urology  Anemia related to GI bleed we will recheck hemoglobin to make sure he is not getting into a dangerous range  Blood pressure mild elevation but not in the range where we have to start any medicines  Deconditioning-face-to-face evaluation Physical therapy  is recommended To use a walker Hopefully physical therapy 2-3 times a week would help build up his conditioning and give him more stability lessen the risk of falls

## 2018-02-14 LAB — BASIC METABOLIC PANEL
BUN/Creatinine Ratio: 24 (ref 10–24)
BUN: 86 mg/dL (ref 8–27)
CO2: 26 mmol/L (ref 20–29)
Calcium: 8.4 mg/dL — ABNORMAL LOW (ref 8.6–10.2)
Chloride: 99 mmol/L (ref 96–106)
Creatinine, Ser: 3.52 mg/dL — ABNORMAL HIGH (ref 0.76–1.27)
GFR calc Af Amer: 18 mL/min/{1.73_m2} — ABNORMAL LOW (ref >59)
GFR calc non Af Amer: 16 mL/min/{1.73_m2} — ABNORMAL LOW (ref >59)
Glucose: 103 mg/dL — ABNORMAL HIGH (ref 65–99)
Potassium: 3.6 mmol/L (ref 3.5–5.2)
Sodium: 141 mmol/L (ref 134–144)

## 2018-02-14 LAB — CBC WITH DIFFERENTIAL/PLATELET
Basophils Absolute: 0 10*3/uL (ref 0.0–0.2)
Basos: 0 %
EOS (ABSOLUTE): 0.1 10*3/uL (ref 0.0–0.4)
Eos: 1 %
Hematocrit: 27.4 % — ABNORMAL LOW (ref 37.5–51.0)
Hemoglobin: 8.4 g/dL — ABNORMAL LOW (ref 13.0–17.7)
Immature Grans (Abs): 0 10*3/uL (ref 0.0–0.1)
Immature Granulocytes: 0 %
Lymphocytes Absolute: 1.1 10*3/uL (ref 0.7–3.1)
Lymphs: 11 %
MCH: 23.9 pg — ABNORMAL LOW (ref 26.6–33.0)
MCHC: 30.7 g/dL — ABNORMAL LOW (ref 31.5–35.7)
MCV: 78 fL — ABNORMAL LOW (ref 79–97)
Monocytes Absolute: 0.4 10*3/uL (ref 0.1–0.9)
Monocytes: 4 %
NEUTROS ABS: 8.3 10*3/uL — AB (ref 1.4–7.0)
NEUTROS PCT: 84 %
Platelets: 326 10*3/uL (ref 150–450)
RBC: 3.51 x10E6/uL — ABNORMAL LOW (ref 4.14–5.80)
RDW: 22.6 % — ABNORMAL HIGH (ref 11.6–15.4)
WBC: 10 10*3/uL (ref 3.4–10.8)

## 2018-02-14 LAB — PHOSPHORUS: Phosphorus: 3 mg/dL (ref 2.8–4.1)

## 2018-02-16 ENCOUNTER — Ambulatory Visit: Payer: Medicare Other | Attending: Internal Medicine | Admitting: Physical Therapy

## 2018-02-16 ENCOUNTER — Encounter: Payer: Self-pay | Admitting: Physical Therapy

## 2018-02-16 ENCOUNTER — Other Ambulatory Visit: Payer: Self-pay

## 2018-02-16 DIAGNOSIS — M6281 Muscle weakness (generalized): Secondary | ICD-10-CM | POA: Diagnosis present

## 2018-02-16 DIAGNOSIS — R2681 Unsteadiness on feet: Secondary | ICD-10-CM

## 2018-02-16 NOTE — Therapy (Signed)
[facility] [address], [place], [place], [address] Phone: [phone]   Fax:  [phone]  Physical Therapy Evaluation  Patient Details  Name: Andrew Guerra MRN: [id number] Date of Birth: 11/02/1940 Referring Provider (PT): Oren Binet, MD   Encounter Date: 02/16/2018  PT End of Session - 02/16/18 1531    Visit Number  1    Date for PT Re-Evaluation  04/17/18    Authorization Type  [employer] medicare     Authorization Time Period  02/16/18 to 04/17/18    Authorization - Visit Number  1    Authorization - Number of Visits  10    PT Start Time  1829    PT Stop Time  9371    PT Time Calculation (min)  42 min    Equipment Utilized During Treatment  Oxygen   2L   Activity Tolerance  No increased pain;Patient tolerated treatment well    Behavior During Therapy  [facility] for tasks assessed/performed       Past Medical History:  Diagnosis Date  . 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  [name] Cataract    right eye - surgery to remove  . Elevated PSA 11/29/2016   Patient is followed [facility].  Most recent PSA near 11.  He has had atypia on biopsy.  November 2008  . Essential hypertension, benign 06/08/2012  . Full dentures   . Glaucoma   . Heart murmur    since childhood, never has caused any problems  . History of rheumatic fever 11/04/2015  . Hyperlipidemia 04/25/2013  . Hypertension   . Malignant neoplasm of left lung ([place]) 02/08/2017  . RBBB 07/12/2017  . Sickle cell trait ([place])    no problems per patient    Past Surgical History:  Procedure Laterality Date  . BIOPSY  11/11/2017   Procedure: BIOPSY;  Surgeon: Lavena Bullion, DO;  Location: [facility];  Service: Gastroenterology;;  . BIOPSY  01/10/2018   Procedure: BIOPSY;  Surgeon: [name], MD;  Location: [facility];   Service: Gastroenterology;;  . cataract eye surgery Right   . COLONOSCOPY  11/2009   hx polyps/[place]  . ESOPHAGOGASTRODUODENOSCOPY N/A 11/11/2017   Procedure: ESOPHAGOGASTRODUODENOSCOPY (EGD);  Surgeon: Lavena Bullion, DO;  Location: [facility];  Service: Gastroenterology;  Laterality: N/A;  . ESOPHAGOGASTRODUODENOSCOPY (EGD) WITH PROPOFOL N/A 01/10/2018   Procedure: ESOPHAGOGASTRODUODENOSCOPY (EGD) WITH PROPOFOL;  Surgeon: [name], MD;  Location: [facility];  Service: Gastroenterology;  Laterality: N/A;  . IR FLUORO GUIDE CV LINE RIGHT  01/16/2018  . IR FLUORO GUIDE CV LINE RIGHT  01/31/2018  . IR US GUIDE VASC ACCESS RIGHT  01/16/2018  . IR US GUIDE VASC ACCESS RIGHT  01/31/2018  . PROSTATE BIOPSY    . right eye surgery     to lower eye pressure  . RIGHT HEART CATH N/A 01/19/2018   Procedure: RIGHT HEART CATH;  Surgeon: Nelva Bush, MD;  Location: [facility];  Service: Cardiovascular;  Laterality: N/A;  . TONSILLECTOMY    . VIDEO ASSISTED THORACOSCOPY (VATS)/ LOBECTOMY Right 12/26/2015  . wisdom tteeth ext      There were no vitals filed for this visit.   Subjective Assessment - 02/16/18 1454    Subjective  Pt reports that he was hospitalized earlier this year due to bleeding ulcers. Once in the hospital  he had multiple other issues that were addressed and he was started on temporary dialysis. He spent 31 days in the hospital and is feeling weak. He feels that he lost alot of muscle mass and is currently having to use a rollator. He has been trying to do regular walking around the house.     Pertinent History  recent hospitalization due to kidney failure, heart failure    Limitations  House hold activities;Walking    Patient Stated Goals  improve his stamina and strength to allow him to get in and out of the car with improved ease     Currently in Pain?  No/denies         [facility] PT Assessment - 02/16/18 0001      Assessment   Medical Diagnosis   Deconditioning     Referring Provider (PT)  Oren Binet, MD    Onset Date/Surgical Date  01/08/18    Prior Therapy  none       Precautions   Precautions  Fall    Precaution Comments  pt uses 2L O2      Balance Screen   Has the patient fallen in the past 6 months  No    Has the patient had a decrease in activity level because of a fear of falling?   No    Is the patient reluctant to leave their home because of a fear of falling?   No      Home Film/video editor residence      Prior Function   Level of Independence  Independent with basic ADLs      Cognition   Overall Cognitive Status  Within Functional Limits for tasks assessed      ROM / Strength   AROM / PROM / Strength  Strength      Strength   Strength Assessment Site  Hip;Knee;Ankle    Right/Left Hip  Right;Left    Right Hip Flexion  3/5    Right Hip Extension  3/5    Right Hip ABduction  3/5    Left Hip Flexion  3/5    Left Hip ABduction  3/5    Right/Left Knee  Right;Left    Right Knee Flexion  3/5    Right Knee Extension  4/5    Left Knee Flexion  3/5    Left Knee Extension  4/5    Right/Left Ankle  Right;Left    Right Ankle Dorsiflexion  4/5    Left Ankle Dorsiflexion  4/5      Flexibility   Soft Tissue Assessment /Muscle Length  yes    Hamstrings  90/90 lacking 40 deg bilaterally       Transfers   Five time sit to stand comments   17 sec, UE support needed      Ambulation/Gait   Gait Comments  Pt ambulated with Rollator, flexed posture and poor awareness of oxygen tube      Standardized Balance Assessment   Standardized Balance Assessment  Timed Up and Go Test      Timed Up and Go Test   TUG Comments  15 sec, using rollator                Objective measurements completed on examination: See above findings.      [facility] Adult PT Treatment/Exercise - 02/16/18 0001      Exercises   Exercises  Knee/Hip      Knee/Hip Exercises: Seated  Other Seated Knee/Hip  Exercises  heel/toe raises x7 reps each, HEP demo     Marching  Both;1 set;5 reps    Marching Limitations  HEP demo              PT Education - 02/16/18 1530    Education Details  eval findings/POC; implemented and reviewed HEP    Person(s) Educated  Patient;Spouse    Methods  Handout;Explanation;Demonstration    Comprehension  Verbalized understanding;Returned demonstration       PT Short Term Goals - 02/16/18 1541      PT SHORT TERM GOAL #1   Title  Pt will be independent with his initial HEP to improve LE strength and stamina.     Time  4    Period  Weeks    Status  New    Target Date  03/18/18        PT Long Term Goals - 02/16/18 1541      PT LONG TERM GOAL #1   Title  Pt will have increase in LE strength to atleast 4/5MMT which will improve his efficiceny with daily tasks.     Time  8    Period  Weeks    Status  New    Target Date  04/17/18      PT LONG TERM GOAL #2   Title  Pt will have increased LE strength and power evident by his ability to complete 5x sit to stand without UE support in less than 14 sec.     Time  8    Period  Weeks    Status  New      PT LONG TERM GOAL #3   Title  Pt will complete TUG in less than 14 sec with LRAD to reflect improvements in his stability with ambulation.    Time  8    Period  Weeks    Status  New      PT LONG TERM GOAL #4   Title  Pt will be independent with his advanced HEP at home and the gym to allow for safe transition following discharge from PT.     Time  8    Period  Weeks    Status  New      PT LONG TERM GOAL #5   Title  Pt will report atleast 75% improvement in his strength and participation in daily activity from the start of PT to decrease caregiver support.     Time  8    Period  Weeks    Status  New             Plan - 02/16/18 1532    Clinical Impression Statement  Pt is a pleasant 78 y.o M with multiple co-morbidities recently hospitalized for 30+ days. He is now on 2L oxygen and using  a rollator secondary to deconditioning and generalized fatigue as a result of his hospitalization. His BLE strength is generally 3/5 MMT and he demonstrated signs of fatigue with 5x sit to stand and the TUG test completed this evaluation. Pt typically goes to the gym 3 days a week and would like to be able to transition back to this after finishing PT. He would benefit from skilled PT atleast 2x a week to improve his strength, endurance and improve his independence with daily tasks such as getting in and out of the car and preparing his meals.     History and Personal Factors relevant to plan of care:  possible dialysis    Clinical Presentation  Stable    Clinical Decision Making  Moderate    Rehab Potential  Good    PT Frequency  2x / week    PT Duration  8 weeks    PT Treatment/Interventions  ADLs/Self Care Home Management;Gait training;Balance training;Therapeutic exercise;Therapeutic activities;Functional mobility training;Stair training;Neuromuscular re-education;Patient/family education;Manual techniques;Passive range of motion    PT Next Visit Plan  6 MWT; progress LE strength and HEP as able    PT Home Exercise Plan  [email or web address]     Consulted and Agree with Plan of Care  Patient       Patient will benefit from skilled therapeutic intervention in order to improve the following deficits and impairments:  Decreased activity tolerance, Decreased strength, Impaired flexibility, Improper body mechanics, Increased edema, Decreased endurance, Decreased mobility  Visit Diagnosis: Muscle weakness (generalized)  Unsteadiness on feet     Problem List Patient Active Problem List   Diagnosis Date Noted  . Acute renal failure ([place])   . PAF (paroxysmal atrial fibrillation) ([place])   . Pressure injury of skin 01/17/2018  . Intestinal metaplasia of gastric mucosa   . Iron deficiency anemia   . Anasarca   . Malnutrition of moderate degree 01/10/2018  . GI bleed 01/09/2018  . Acute respiratory  failure with hypoxia ([place]) 11/12/2017  . Gastritis and gastroduodenitis   . Acute gastric ulcer without hemorrhage or perforation   . Duodenal ulcer   . Paraesophageal hernia   . Upper GI bleeding 11/09/2017  . CKD (chronic kidney disease), stage III ([place]) 11/09/2017  . Normocytic anemia 11/09/2017  . Acute on chronic diastolic CHF (congestive heart failure) ([place]) 11/09/2017  . Melena   . RBBB 07/12/2017  . 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 Malignant neoplasm of left lung ([place]) 02/08/2017  . Elevated PSA 11/29/2016  . Heart murmur 11/04/2015  . History of active rheumatic fever 11/04/2015  . Solitary pulmonary nodule 08/31/2013  . 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 Thyroid nodule 08/31/2013  . Hyperlipidemia 04/25/2013  . Essential hypertension, benign 06/08/2012    3:49 PM,02/16/18 Sherol Dade PT, DPT [facility] [address] [place], [place], [address] Phone: [phone]   Fax:  [phone]  Name: DAUNDRE BIEL MRN: [id number] Date of Birth: 08-20-1940

## 2018-02-16 NOTE — Patient Instructions (Signed)
Access Code: [email or web address]  URL: [email or web address]  Date: 02/16/2018  Prepared by: Sherol Dade   Exercises  Seated Heel Raise - 20 reps - 3 sets - 1x daily - 7x weekly  Seated Heel Toe Raises - 20 reps - 3 sets - 1x daily - 7x weekly  Seated March - 20 reps - 3 sets - 1x daily - 7x weekly    [facility] [address] [place], [place] [address] Phone # [phone] Fax [phone]

## 2018-02-21 ENCOUNTER — Ambulatory Visit: Payer: Medicare Other | Attending: Internal Medicine | Admitting: Physical Therapy

## 2018-02-21 DIAGNOSIS — R2681 Unsteadiness on feet: Secondary | ICD-10-CM | POA: Insufficient documentation

## 2018-02-21 DIAGNOSIS — M6281 Muscle weakness (generalized): Secondary | ICD-10-CM

## 2018-02-21 NOTE — Patient Instructions (Signed)
Access Code: [email or web address]  URL: [email or web address]  Date: 02/21/2018  Prepared by: Sherol Dade   Exercises  Seated Hip Abduction - 10-15 reps - 2 sets - 1x daily - 7x weekly  Seated March with Resistance - 10 reps - 3 sets - 1x daily - 7x weekly  Seated Heel Raise - 20 reps - 3 sets - 1x daily - 7x weekly  Standing March with Counter Support - 15-20 reps - 2 sets - 1x daily - 7x weekly  Side Stepping with Counter Support - 15-20 reps - 3 sets - 1x daily - 7x weekly    [facility] [address], [place], [place] [address] Phone # [phone] Fax [phone]

## 2018-02-21 NOTE — Therapy (Signed)
[facility] [address] [place], [place], [address] Phone: [phone]   Fax:  [phone]  Physical Therapy Treatment  Patient Details  Name: Andrew Guerra MRN: [id number] Date of Birth: [birth year] Referring Provider (PT): Oren Binet, MD   Encounter Date: 02/21/2018  PT End of Session - 02/21/18 0825    Visit Number  2    Date for PT Re-Evaluation  04/17/18    Authorization Type  [employer] medicare     Authorization Time Period  02/16/18 to 04/17/18    Authorization - Visit Number  2    Authorization - Number of Visits  10    PT Start Time  0800    PT Stop Time  0839    PT Time Calculation (min)  39 min    Equipment Utilized During Treatment  Oxygen   2L   Activity Tolerance  No increased pain;Patient tolerated treatment well    Behavior During Therapy  [facility] for tasks assessed/performed       Past Medical History:  Diagnosis Date  . 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  [name] Cataract    right eye - surgery to remove  . Elevated PSA 11/29/2016   [facility].  Most recent PSA near 11.  He has had atypia on biopsy.  November 2008  . Essential hypertension, benign 06/08/2012  . Full dentures   . Glaucoma   . Heart murmur    since childhood, never has caused any problems  . History of rheumatic fever 11/04/2015  . Hyperlipidemia 04/25/2013  . Hypertension   . Malignant neoplasm of left lung ([place]) 02/08/2017  . RBBB 07/12/2017  . Sickle cell trait ([place])    no problems per patient    Past Surgical History:  Procedure Laterality Date  . BIOPSY  11/11/2017   Procedure: BIOPSY;  Surgeon: Lavena Bullion, DO;  Location: [facility];  Service: Gastroenterology;;  . BIOPSY  01/10/2018   Procedure: BIOPSY;  Surgeon: [name], MD;  Location: [place];   Service: Gastroenterology;;  . cataract eye surgery Right   . COLONOSCOPY  11/2009   hx polyps/[place]  . ESOPHAGOGASTRODUODENOSCOPY N/A 11/11/2017   Procedure: ESOPHAGOGASTRODUODENOSCOPY (EGD);  Surgeon: Lavena Bullion, DO;  Location: [facility]COPY;  Service: Gastroenterology;  Laterality: N/A;  . ESOPHAGOGASTRODUODENOSCOPY (EGD) WITH PROPOFOL N/A 01/10/2018   Procedure: ESOPHAGOGASTRODUODENOSCOPY (EGD) WITH PROPOFOL;  Surgeon: [name], MD;  Location: [place];  Service: Gastroenterology;  Laterality: N/A;  . IR FLUORO GUIDE CV LINE RIGHT  01/16/2018  . IR FLUORO GUIDE CV LINE RIGHT  01/31/2018  . IR US GUIDE VASC ACCESS RIGHT  01/16/2018  . IR US GUIDE VASC ACCESS RIGHT  01/31/2018  . PROSTATE BIOPSY    . right eye surgery     to lower eye pressure  . RIGHT HEART CATH N/A 01/19/2018   Procedure: RIGHT HEART CATH;  Surgeon: Nelva Bush, MD;  Location: [place] CV [facility];  Service: Cardiovascular;  Laterality: N/A;  . TONSILLECTOMY    . VIDEO ASSISTED THORACOSCOPY (VATS)/ LOBECTOMY Right 12/26/2015  . wisdom tteeth ext      There were no vitals filed for this visit.  Subjective Assessment - 02/21/18 0807    Subjective  Pt states that he was able to work on his HEP. He thinks this helped with his ability  to stand alone by himself.     Pertinent History  recent hospitalization due to kidney failure, heart failure    Limitations  House hold activities;Walking    Patient Stated Goals  improve his stamina and strength to allow him to get in and out of the car with improved ease     Currently in Pain?  No/denies                       [facility] Adult PT Treatment/Exercise - 02/21/18 0001      Ambulation/Gait   Pre-Gait Activities  6MWT with rollator and supplemental O2 549ft      Knee/Hip Exercises: Standing   Heel Raises  Both;2 sets;10 reps    Heel Raises Limitations  UE support     Hip Flexion  Left;Right;1 set;10 reps;Knee bent    Other Standing Knee  Exercises  sidestepping Lt/Rt with UE support x10 reps       Knee/Hip Exercises: Seated   Long Arc Quad  Both;Strengthening;2 sets;10 reps    Long Arc Quad Weight  3 lbs.    Long [employer] Limitations  2nd set with 5# x10 reps     Clamshell with [employer]  Green   2x10 reps    Other Seated Knee/Hip Exercises  B heel raise x20 reps with 5# dumbbells resting on thighs    Marching  Both;1 set;5 reps    Marching Limitations  yellow TB     Hamstring Curl  Strengthening;Both;2 sets;10 reps    Hamstring Limitations  red TB             PT Education - 02/21/18 0825    Education Details  updated and reviewed HEP    Person(s) Educated  Patient    Methods  Explanation;Verbal cues;Handout    Comprehension  Verbalized understanding;Returned demonstration       PT Short Term Goals - 02/21/18 0844      PT SHORT TERM GOAL #1   Title  Pt will be independent with his initial HEP to improve LE strength and stamina.     Time  4    Period  Weeks    Status  Achieved        PT Long Term Goals - 02/21/18 0841      PT LONG TERM GOAL #1   Title  Pt will have increase in LE strength to atleast 4/5MMT which will improve his efficiceny with daily tasks.     Time  8    Period  Weeks    Status  New      PT LONG TERM GOAL #2   Title  Pt will have increased LE strength and power evident by his ability to complete 5x sit to stand without UE support in less than 14 sec.     Time  8    Period  Weeks    Status  New      PT LONG TERM GOAL #3   Title  Pt will complete TUG in less than 14 sec with LRAD to reflect improvements in his stability with ambulation.    Time  8    Period  Weeks    Status  New      PT LONG TERM GOAL #4   Title  Pt will be independent with his advanced HEP at home and the gym to allow for safe transition following discharge from PT.     Time  8  Period  Weeks    Status  New      PT LONG TERM GOAL #5   Title  Pt will report atleast 75% improvement in his strength  and participation in daily activity from the start of PT to decrease caregiver support.     Time  8    Period  Weeks    Status  New      Additional Long Term Goals   Additional Long Term Goals  Yes      PT LONG TERM GOAL #6   Title  Pt will have increased stamina and endurance evident by his ability to ambulate atleast 1429ft during 6 MWT.    Time  8    Period  Weeks    Status  New            Plan - 02/21/18 9147    Clinical Impression Statement  Pt has been compliant with his HEP and standing activity since his evaluation last week. He noticed improvements in his ability to stand without his AD. Pt was able to ambulate 597ft on the 6 MWT which is well below what is expected from someone his age. Long term goals were updated to address this. Session focused on therex progression with multiple updates made to his HEP. Pt reported no increase in muscle fatigue end of session and had good understanding of HEP updates.     Rehab Potential  Good    PT Frequency  2x / week    PT Duration  8 weeks    PT Treatment/Interventions  ADLs/Self Care Home Management;Gait training;Balance training;Therapeutic exercise;Therapeutic activities;Functional mobility training;Stair training;Neuromuscular re-education;Patient/family education;Manual techniques;Passive range of motion    PT Next Visit Plan  f/u on soreness and progress to more standing therex as able; UE strength (rows, bicep curls/ tricep ext, etc)    PT Home Exercise Plan  [email or web address]     Consulted and Agree with Plan of Care  Patient       Patient will benefit from skilled therapeutic intervention in order to improve the following deficits and impairments:  Decreased activity tolerance, Decreased strength, Impaired flexibility, Improper body mechanics, Increased edema, Decreased endurance, Decreased mobility  Visit Diagnosis: Muscle weakness (generalized)  Unsteadiness on feet     Problem List Patient Active Problem List    Diagnosis Date Noted  . Acute renal failure ([place])   . PAF (paroxysmal atrial fibrillation) ([place])   . Pressure injury of skin 01/17/2018  . Intestinal metaplasia of gastric mucosa   . Iron deficiency anemia   . Anasarca   . Malnutrition of moderate degree 01/10/2018  . GI bleed 01/09/2018  . Acute respiratory failure with hypoxia ([place]) 11/12/2017  . Gastritis and gastroduodenitis   . Acute gastric ulcer without hemorrhage or perforation   . Duodenal ulcer   . Paraesophageal hernia   . Upper GI bleeding 11/09/2017  . CKD (chronic kidney disease), stage III ([place]) 11/09/2017  . Normocytic anemia 11/09/2017  . Acute on chronic diastolic CHF (congestive heart failure) ([place]) 11/09/2017  . Melena   . RBBB 07/12/2017  . 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 Malignant neoplasm of left lung ([place]) 02/08/2017  . Elevated PSA 11/29/2016  . Heart murmur 11/04/2015  . History of active rheumatic fever 11/04/2015  . Solitary pulmonary nodule 08/31/2013  . 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 Thyroid nodule 08/31/2013  . Hyperlipidemia 04/25/2013  . Essential hypertension, benign 06/08/2012     8:47 AM,02/21/18  Sherol Dade PT, DPT [place] [address] [place], [place], [address] Phone: [phone]   Fax:  [phone]  Name: LEANTHONY RHETT MRN: [id number] Date of Birth: 01/23/1940

## 2018-02-23 ENCOUNTER — Ambulatory Visit: Payer: Medicare Other | Admitting: Physical Therapy

## 2018-02-23 ENCOUNTER — Encounter: Payer: Self-pay | Admitting: Physical Therapy

## 2018-02-23 DIAGNOSIS — M6281 Muscle weakness (generalized): Secondary | ICD-10-CM

## 2018-02-23 DIAGNOSIS — R2681 Unsteadiness on feet: Secondary | ICD-10-CM

## 2018-02-23 NOTE — Patient Instructions (Signed)
Access Code: [email or web address]  URL: [email or web address]  Date: 02/23/2018  Prepared by: Sherol Dade   Exercises  Seated March with Resistance - 10 reps - 3 sets - 1x daily - 7x weekly  Seated Heel Raise - 20 reps - 3 sets - 1x daily - 7x weekly  Standing March with Counter Support - 15-20 reps - 2 sets - 1x daily - 7x weekly  Side Stepping with Counter Support - 15-20 reps - 3 sets - 1x daily - 7x weekly  Standing Bicep Curls Supinated with Dumbbells - 10 reps - 2 sets - 1x daily - 7x weekly  Standing Row with Anchored Resistance - 10-15 reps - 2 sets - 1x daily - 7x weekly    [facility] [address] [place], [place] [address] Phone # [phone] Fax [phone]

## 2018-02-23 NOTE — Therapy (Signed)
[facility] [address], [place], [place], [address] Phone: [phone]   Fax:  [phone]  Physical Therapy Treatment  Patient Details  Name: Andrew Guerra MRN: [id number] Date of Birth: 02/04/1940 Referring Provider (PT): Oren Binet, MD   Encounter Date: 02/23/2018  PT End of Session - 02/23/18 0817    Visit Number  3    Date for PT Re-Evaluation  04/17/18    Authorization Type  [employer] medicare     Authorization Time Period  02/16/18 to 04/17/18    Authorization - Visit Number  3    Authorization - Number of Visits  10    PT Start Time  0810   Pt arrived late    PT Stop Time  0842    PT Time Calculation (min)  32 min    Equipment Utilized During Treatment  Oxygen   2L   Activity Tolerance  No increased pain;Patient tolerated treatment well    Behavior During Therapy  [facility] for tasks assessed/performed       Past Medical History:  Diagnosis Date  . 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  [name] Cataract    right eye - surgery to remove  . Elevated PSA 11/29/2016   [facility].  Most recent PSA near 11.  He has had atypia on biopsy.  November 2008  . Essential hypertension, benign 06/08/2012  . Full dentures   . Glaucoma   . Heart murmur    since childhood, never has caused any problems  . History of rheumatic fever 11/04/2015  . Hyperlipidemia 04/25/2013  . Hypertension   . Malignant neoplasm of left lung ([place]) 02/08/2017  . RBBB 07/12/2017  . Sickle cell trait ([place])    no problems per patient    Past Surgical History:  Procedure Laterality Date  . BIOPSY  11/11/2017   Procedure: BIOPSY;  Surgeon: Lavena Bullion, DO;  Location: [place] [facility];  Service: Gastroenterology;;  . BIOPSY  01/10/2018   Procedure: BIOPSY;  Surgeon: [name], MD;  Location:  [place];  Service: Gastroenterology;;  . cataract eye surgery Right   . COLONOSCOPY  11/2009   hx polyps/[place]  . ESOPHAGOGASTRODUODENOSCOPY N/A 11/11/2017   Procedure: ESOPHAGOGASTRODUODENOSCOPY (EGD);  Surgeon: Lavena Bullion, DO;  Location: [facility]COPY;  Service: Gastroenterology;  Laterality: N/A;  . ESOPHAGOGASTRODUODENOSCOPY (EGD) WITH PROPOFOL N/A 01/10/2018   Procedure: ESOPHAGOGASTRODUODENOSCOPY (EGD) WITH PROPOFOL;  Surgeon: [name], MD;  Location: [place];  Service: Gastroenterology;  Laterality: N/A;  . IR FLUORO GUIDE CV LINE RIGHT  01/16/2018  . IR FLUORO GUIDE CV LINE RIGHT  01/31/2018  . IR US GUIDE VASC ACCESS RIGHT  01/16/2018  . IR US GUIDE VASC ACCESS RIGHT  01/31/2018  . PROSTATE BIOPSY    . right eye surgery     to lower eye pressure  . RIGHT HEART CATH N/A 01/19/2018   Procedure: RIGHT HEART CATH;  Surgeon: Nelva Bush, MD;  Location: [place] [facility];  Service: Cardiovascular;  Laterality: N/A;  . TONSILLECTOMY    . VIDEO ASSISTED THORACOSCOPY (VATS)/ LOBECTOMY Right 12/26/2015  . wisdom tteeth ext      There were no vitals filed for this visit.  Subjective Assessment - 02/23/18 0813    Subjective  Pt reports that things are going well. He had minimal soreness after his  last session.     Pertinent History  recent hospitalization due to kidney failure, heart failure    Limitations  House hold activities;Walking    Patient Stated Goals  improve his stamina and strength to allow him to get in and out of the car with improved ease     Currently in Pain?  No/denies                       [facility] Adult PT Treatment/Exercise - 02/23/18 0001      Exercises   Exercises  Other Exercises    Other Exercises   standing bicep curls (supinated) 2x10 reps 5#      Knee/Hip Exercises: Standing   Hip Abduction  Stengthening;10 reps;1 set;Both    Abduction Limitations  yellow TB around feet     Other Standing Knee Exercises   standing rows with green TB 2x15 reps       Knee/Hip Exercises: Seated   Long Arc Quad  Both;Strengthening;2 sets;10 reps    Long Arc Quad Limitations  7.5#    Clamshell with [employer]   x15 reps on [employer]  Right;Left;AAROM;1 set;15 reps    Sit to [employer]  1 set;10 reps   therapist cues to maintain upright posture       B hamstring curls with green TB 2x15 reps       PT Education - 02/23/18 0845    Education Details  technique with therex/sit to stand     Person(s) Educated  Patient    Methods  Explanation;Handout;Verbal cues    Comprehension  Returned demonstration;Verbalized understanding       PT Short Term Goals - 02/21/18 0844      PT SHORT TERM GOAL #1   Title  Pt will be independent with his initial HEP to improve LE strength and stamina.     Time  4    Period  Weeks    Status  Achieved        PT Long Term Goals - 02/21/18 0841      PT LONG TERM GOAL #1   Title  Pt will have increase in LE strength to atleast 4/5MMT which will improve his efficiceny with daily tasks.     Time  8    Period  Weeks    Status  New      PT LONG TERM GOAL #2   Title  Pt will have increased LE strength and power evident by his ability to complete 5x sit to stand without UE support in less than 14 sec.     Time  8    Period  Weeks    Status  New      PT LONG TERM GOAL #3   Title  Pt will complete TUG in less than 14 sec with LRAD to reflect improvements in his stability with ambulation.    Time  8    Period  Weeks    Status  New      PT LONG TERM GOAL #4   Title  Pt will be independent with his advanced HEP at home and the gym to allow for safe transition following discharge from PT.     Time  8    Period  Weeks    Status  New      PT LONG TERM GOAL #5   Title  Pt will report atleast 75% improvement in his strength and participation in daily  activity from the start of PT to decrease caregiver support.     Time  8    Period  Weeks    Status  New       Additional Long Term Goals   Additional Long Term Goals  Yes      PT LONG TERM GOAL #6   Title  Pt will have increased stamina and endurance evident by his ability to ambulate atleast 1442ft during 6 MWT.    Time  8    Period  Weeks    Status  New            Plan - 02/23/18 4268    Clinical Impression Statement  Pt arrived late to today's appointment, but he did deny soreness following last session. Several of today's exercises were progressed and several UE strengthening exercises were introduced. Pt required therapist instruction for safe sit to stand without UE support and was able to complete 10 reps without noted fatigue. HEP was updated to reflect increases in strength and pt had good understanding of this.     Rehab Potential  Good    PT Frequency  2x / week    PT Duration  8 weeks    PT Treatment/Interventions  ADLs/Self Care Home Management;Gait training;Balance training;Therapeutic exercise;Therapeutic activities;Functional mobility training;Stair training;Neuromuscular re-education;Patient/family education;Manual techniques;Passive range of motion    PT Next Visit Plan  f/u on soreness and progress to more standing therex as able; UE strength (rows, bicep curls/ tricep ext, etc)    PT Home Exercise Plan  [email or web address]     Consulted and Agree with Plan of Care  Patient       Patient will benefit from skilled therapeutic intervention in order to improve the following deficits and impairments:  Decreased activity tolerance, Decreased strength, Impaired flexibility, Improper body mechanics, Increased edema, Decreased endurance, Decreased mobility  Visit Diagnosis: Muscle weakness (generalized)  Unsteadiness on feet     Problem List Patient Active Problem List   Diagnosis Date Noted  . Acute renal failure ([place])   . PAF (paroxysmal atrial fibrillation) ([place])   . Pressure injury of skin 01/17/2018  . Intestinal metaplasia of gastric mucosa   . Iron deficiency anemia   .  Anasarca   . Malnutrition of moderate degree 01/10/2018  . GI bleed 01/09/2018  . Acute respiratory failure with hypoxia ([place]) 11/12/2017  . Gastritis and gastroduodenitis   . Acute gastric ulcer without hemorrhage or perforation   . Duodenal ulcer   . Paraesophageal hernia   . Upper GI bleeding 11/09/2017  . CKD (chronic kidney disease), stage III ([place]) 11/09/2017  . Normocytic anemia 11/09/2017  . Acute on chronic diastolic CHF (congestive heart failure) ([place]) 11/09/2017  . Melena   . RBBB 07/12/2017  . 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 Malignant neoplasm of left lung ([place]) 02/08/2017  . Elevated PSA 11/29/2016  . Heart murmur 11/04/2015  . History of active rheumatic fever 11/04/2015  . Solitary pulmonary nodule 08/31/2013  . 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 Thyroid nodule 08/31/2013  . Hyperlipidemia 04/25/2013  . Essential hypertension, benign 06/08/2012    8:47 AM,02/23/18 Sherol Dade PT, DPT [place] [facility] [address] [place], [place], [address] Phone: [phone]   Fax:  [phone]  Name: ATTILIO ZEITLER MRN: [id number] Date of Birth: 05-17-1940

## 2018-02-28 ENCOUNTER — Encounter: Payer: Self-pay | Admitting: Physical Therapy

## 2018-02-28 ENCOUNTER — Ambulatory Visit: Payer: Medicare Other | Admitting: Physical Therapy

## 2018-02-28 DIAGNOSIS — M6281 Muscle weakness (generalized): Secondary | ICD-10-CM

## 2018-02-28 DIAGNOSIS — R2681 Unsteadiness on feet: Secondary | ICD-10-CM

## 2018-02-28 NOTE — Therapy (Signed)
[facility] [address] [place], [place], [address] Phone: [phone]   Fax:  [phone]  Physical Therapy Treatment  Patient Details  Name: Andrew Guerra MRN: [id number] Date of Birth: 28-Jun-1940 Referring Provider (PT): Oren Binet, MD   Encounter Date: 02/28/2018  PT End of Session - 02/28/18 0840    Visit Number  4    Date for PT Re-Evaluation  04/17/18    Authorization Type  [employer] medicare     Authorization Time Period  02/16/18 to 04/17/18    Authorization - Visit Number  4    Authorization - Number of Visits  10    PT Start Time  0800    PT Stop Time  0839    PT Time Calculation (min)  39 min    Equipment Utilized During Treatment  Oxygen   2L   Activity Tolerance  No increased pain;Patient tolerated treatment well    Behavior During Therapy  [facility] for tasks assessed/performed       Past Medical History:  Diagnosis Date  . 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  [name] Cataract    right eye - surgery to remove  . Elevated PSA 11/29/2016   [facility].  Most recent PSA near 11.  He has had atypia on biopsy.  November 2008  . Essential hypertension, benign 06/08/2012  . Full dentures   . Glaucoma   . Heart murmur    since childhood, never has caused any problems  . History of rheumatic fever 11/04/2015  . Hyperlipidemia 04/25/2013  . Hypertension   . Malignant neoplasm of left lung ([place]) 02/08/2017  . RBBB 07/12/2017  . Sickle cell trait ([place])    no problems per patient    Past Surgical History:  Procedure Laterality Date  . BIOPSY  11/11/2017   Procedure: BIOPSY;  Surgeon: Lavena Bullion, DO;  Location: [place] [facility]OSCOPY;  Service: Gastroenterology;;  . BIOPSY  01/10/2018   Procedure: BIOPSY;  Surgeon: [name], MD;  Location: [place];   Service: Gastroenterology;;  . cataract eye surgery Right   . COLONOSCOPY  11/2009   hx polyps/[place]  . ESOPHAGOGASTRODUODENOSCOPY N/A 11/11/2017   Procedure: ESOPHAGOGASTRODUODENOSCOPY (EGD);  Surgeon: Lavena Bullion, DO;  Location: [facility] ENDOSCOPY;  Service: Gastroenterology;  Laterality: N/A;  . ESOPHAGOGASTRODUODENOSCOPY (EGD) WITH PROPOFOL N/A 01/10/2018   Procedure: ESOPHAGOGASTRODUODENOSCOPY (EGD) WITH PROPOFOL;  Surgeon: [name], MD;  Location: [place];  Service: Gastroenterology;  Laterality: N/A;  . IR FLUORO GUIDE CV LINE RIGHT  01/16/2018  . IR FLUORO GUIDE CV LINE RIGHT  01/31/2018  . IR US GUIDE VASC ACCESS RIGHT  01/16/2018  . IR US GUIDE VASC ACCESS RIGHT  01/31/2018  . PROSTATE BIOPSY    . right eye surgery     to lower eye pressure  . RIGHT HEART CATH N/A 01/19/2018   Procedure: RIGHT HEART CATH;  Surgeon: Nelva Bush, MD;  Location: [place] CV LAB;  Service: Cardiovascular;  Laterality: N/A;  . TONSILLECTOMY    . VIDEO ASSISTED THORACOSCOPY (VATS)/ LOBECTOMY Right 12/26/2015  . wisdom tteeth ext      There were no vitals filed for this visit.  Subjective Assessment - 02/28/18 0802    Subjective  Pt states things are going well. No issues currently.     Pertinent History  recent hospitalization  due to kidney failure, heart failure    Limitations  House hold activities;Walking    Patient Stated Goals  improve his stamina and strength to allow him to get in and out of the car with improved ease     Currently in Pain?  No/denies                       [facility] Adult PT Treatment/Exercise - 02/28/18 0001      Knee/Hip Exercises: Stretches   Piriformis Stretch  Left;Right;2 reps;20 seconds    Piriformis Stretch Limitations  seated     Other Knee/Hip Stretches  Seated Lt and Rt glute stretch 2x20 sec       Knee/Hip Exercises: Aerobic   Nustep  L1 to L3 intervals 60 sec each for 2 rounds and decreased to L1, x5 min PT present to  discuss slow pace and focus on breathing.       Knee/Hip Exercises: Standing   Heel Raises  Both;1 set;20 reps    Heel Raises Limitations  on foam pad    Knee Flexion  Left;Right;Strengthening;1 set;10 reps    Knee Flexion Limitations  2# ankle weights, standing on foam pad     Hip Flexion  AROM;Right;Left;1 set    Hip Flexion Limitations  2# ankle weights and UE support, 2nd set without UE support or ankle weights     Other Standing Knee Exercises  NBOS on foam pad with trunk rotation holding blue weighted ball x10 reps           Balance Exercises - 02/28/18 0832      Balance Exercises: Standing   Tandem Stance  Eyes open;Eyes closed;2 reps;20 secs   partial tandem, 1 set full tandem stance    Standing, One Foot on a Step  Eyes open;Eyes closed;6 inch;2 reps;20 secs        PT Education - 02/28/18 0839    Education Details  holding off on balance exercise at home to prevent risk of fall    Person(s) Educated  Patient    Methods  Explanation    Comprehension  Verbalized understanding       PT Short Term Goals - 02/21/18 0844      PT SHORT TERM GOAL #1   Title  Pt will be independent with his initial HEP to improve LE strength and stamina.     Time  4    Period  Weeks    Status  Achieved        PT Long Term Goals - 02/21/18 0841      PT LONG TERM GOAL #1   Title  Pt will have increase in LE strength to atleast 4/5MMT which will improve his efficiceny with daily tasks.     Time  8    Period  Weeks    Status  New      PT LONG TERM GOAL #2   Title  Pt will have increased LE strength and power evident by his ability to complete 5x sit to stand without UE support in less than 14 sec.     Time  8    Period  Weeks    Status  New      PT LONG TERM GOAL #3   Title  Pt will complete TUG in less than 14 sec with LRAD to reflect improvements in his stability with ambulation.    Time  8    Period  Weeks  Status  New      PT LONG TERM GOAL #4   Title  Pt will be  independent with his advanced HEP at home and the gym to allow for safe transition following discharge from PT.     Time  8    Period  Weeks    Status  New      PT LONG TERM GOAL #5   Title  Pt will report atleast 75% improvement in his strength and participation in daily activity from the start of PT to decrease caregiver support.     Time  8    Period  Weeks    Status  New      Additional Long Term Goals   Additional Long Term Goals  Yes      PT LONG TERM GOAL #6   Title  Pt will have increased stamina and endurance evident by his ability to ambulate atleast 1479ft during 6 MWT.    Time  8    Period  Weeks    Status  New            Plan - 02/28/18 0840    Clinical Impression Statement  Pt continues to work on his HEP regularly with minimal issues. He did become fatigued with aerobic activity completed at the start of his session and therapist had to encourage pt to focus on pacing himself and proper breathing. This session also incorporated some standing balance activity which the pt was able to compete without LOB. Did note unsteadiness with true tandem stance and attempts at supported single leg stance and he was encouraged to hold off on completing this at home. Pt would continue to benefit from skilled PT to address remaining limitations with LE strength, endurance and proprioception to decrease need for AD with daily activity.     Rehab Potential  Good    PT Frequency  2x / week    PT Duration  8 weeks    PT Treatment/Interventions  ADLs/Self Care Home Management;Gait training;Balance training;Therapeutic exercise;Therapeutic activities;Functional mobility training;Stair training;Neuromuscular re-education;Patient/family education;Manual techniques;Passive range of motion    PT Next Visit Plan  possible update HEP with balance activity; focus on standing balance on unstable surfaces; hip flexibility     PT Home Exercise Plan  [email or web address]     Consulted and Agree with Plan of  Care  Patient       Patient will benefit from skilled therapeutic intervention in order to improve the following deficits and impairments:  Decreased activity tolerance, Decreased strength, Impaired flexibility, Improper body mechanics, Increased edema, Decreased endurance, Decreased mobility  Visit Diagnosis: Muscle weakness (generalized)  Unsteadiness on feet     Problem List Patient Active Problem List   Diagnosis Date Noted  . Acute renal failure ([place])   . PAF (paroxysmal atrial fibrillation) ([place])   . Pressure injury of skin 01/17/2018  . Intestinal metaplasia of gastric mucosa   . Iron deficiency anemia   . Anasarca   . Malnutrition of moderate degree 01/10/2018  . GI bleed 01/09/2018  . Acute respiratory failure with hypoxia ([place]) 11/12/2017  . Gastritis and gastroduodenitis   . Acute gastric ulcer without hemorrhage or perforation   . Duodenal ulcer   . Paraesophageal hernia   . Upper GI bleeding 11/09/2017  . CKD (chronic kidney disease), stage III ([place]) 11/09/2017  . Normocytic anemia 11/09/2017  . Acute on chronic diastolic CHF (congestive heart failure) ([place]) 11/09/2017  . [place]   . RBBB 07/12/2017  .  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 Malignant neoplasm of left lung ([place]) 02/08/2017  . Elevated PSA 11/29/2016  . Heart murmur 11/04/2015  . History of active rheumatic fever 11/04/2015  . Solitary pulmonary nodule 08/31/2013  . 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 Thyroid nodule 08/31/2013  . Hyperlipidemia 04/25/2013  . Essential hypertension, benign 06/08/2012    8:44 AM,02/28/18 Sherol Dade PT, DPT [facility] [address] [place], [place], [address] Phone: [phone]   Fax:  [phone]  Name: SUMEET GETER MRN: [id number] Date of Birth: 12-14-1940

## 2018-02-28 NOTE — Patient Instructions (Signed)
Access Code: [email or web address]  URL: [email or web address]  Date: 02/28/2018  Prepared by: Sherol Dade   Exercises  Seated Piriformis Stretch - 2 sets - 20 hold - 3-4x daily - 7x weekly    [facility] [address] [place], [place] [address] Phone # [phone] Fax [phone]

## 2018-03-01 ENCOUNTER — Ambulatory Visit: Payer: Medicare Other | Admitting: Physician Assistant

## 2018-03-01 ENCOUNTER — Encounter: Payer: Self-pay | Admitting: Physician Assistant

## 2018-03-01 VITALS — BP 148/72 | HR 84 | Ht 70.5 in

## 2018-03-01 DIAGNOSIS — Q230 Congenital stenosis of aortic valve: Secondary | ICD-10-CM

## 2018-03-01 DIAGNOSIS — N179 Acute kidney failure, unspecified: Secondary | ICD-10-CM

## 2018-03-01 DIAGNOSIS — I5032 Chronic diastolic (congestive) heart failure: Secondary | ICD-10-CM

## 2018-03-01 DIAGNOSIS — Q231 Congenital insufficiency of aortic valve: Secondary | ICD-10-CM

## 2018-03-01 DIAGNOSIS — D500 Iron deficiency anemia secondary to blood loss (chronic): Secondary | ICD-10-CM

## 2018-03-01 DIAGNOSIS — I480 Paroxysmal atrial fibrillation: Secondary | ICD-10-CM

## 2018-03-01 NOTE — Progress Notes (Addendum)
Cardiology Office Note    Date:  03/01/2018   ID:  Andrew Guerra, DOB January 24, 1940, MRN [id number]  PCP:  [name], MD  Cardiologist:  Dr. Radford Pax   Chief Complaint: Hospital follow up  History of Present Illness:   Andrew Guerra is a 78 y.o. male with a PMH of HTN, HLD, bicuspid aortic valve with moderate AS, NSCLC s/p resection and radiation, PUD and recent diagnosis of PAF present for hospital follow up.   Recent long admission 12/23-1/23. Admitted for PCP office for Hgb of 6.4 and acute GI bleed. He underwent EGD which showed gastric/duodenal ulcers. Hospital course was prolonged with ATN requiring dialysis, bilateral pneumonia, UTI and  delirium. Seen by cardiologist for new onset afib RVR and acute diastolic CHF. EF 65% but severe RV dilatation and moderate RV dysfunction was noted. Right heart cath showed normal left-sided filling pressures but mildly elevated right-sided filling pressures as well as mild pulmonary hypertension.  Cardiac output was normal. In regards to afib, treated with IV amidoarone and then taper dose. BP soft to add BB or CCB. CHADSVASc 3. Not felt candidate for anticoagulation.   Here today for follow up.  Patient is feeling better since her discharge.  Edema and breathing has been improved.  He is getting blood work every week with improved renal function and hemoglobin. He  Just had his blood work drawn today.  He is using oxygen 24/7 at 2 L.  No exacerbation of dyspnea.  Denied CP, syncope, palpitation, orthopnea or PND.   Past Medical History:  Diagnosis Date  . 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  [name] Cataract    right eye - surgery to remove  . Elevated PSA 11/29/2016   [facility].  Most recent PSA near 11.  He has had atypia on biopsy.  November 2008  . Essential hypertension, benign 06/08/2012    . Full dentures   . Glaucoma   . Heart murmur    since childhood, never has caused any problems  . History of rheumatic fever 11/04/2015  . Hyperlipidemia 04/25/2013  . Hypertension   . Malignant neoplasm of left lung ([place]) 02/08/2017  . RBBB 07/12/2017  . Sickle cell trait ([place])    no problems per patient    Past Surgical History:  Procedure Laterality Date  . BIOPSY  11/11/2017   Procedure: BIOPSY;  Surgeon: Lavena Bullion, DO;  Location: [place] ENDOSCOPY;  Service: Gastroenterology;;  . BIOPSY  01/10/2018   Procedure: BIOPSY;  Surgeon: [name], MD;  Location: [place];  Service: Gastroenterology;;  . cataract eye surgery Right   . COLONOSCOPY  11/2009   hx polyps/[place]  . ESOPHAGOGASTRODUODENOSCOPY N/A 11/11/2017   Procedure: ESOPHAGOGASTRODUODENOSCOPY (EGD);  Surgeon: Lavena Bullion, DO;  [facility] END[facility]COPY;  Service: Gastroenterology;  Laterality: N/A;  . ESOPHAGOGASTRODUODENOSCOPY (EGD) WITH PROPOFOL N/A 01/10/2018   Procedure: ESOPHAGOGASTRODUODENOSCOPY (EGD) WITH PROPOFOL;  Surgeon: [name], MD;  Location: [place];  Service: Gastroenterology;  Laterality: N/A;  . IR FLUORO GUIDE CV LINE RIGHT  01/16/2018  . IR FLUORO GUIDE CV LINE RIGHT  01/31/2018  . IR US GUIDE VASC ACCESS RIGHT  01/16/2018  . IR US GUIDE VASC ACCESS RIGHT  01/31/2018  . PROSTATE BIOPSY    . right eye surgery     to lower eye pressure  . RIGHT  HEART CATH N/A 01/19/2018   Procedure: RIGHT HEART CATH;  Surgeon: Nelva Bush, MD;  Location: [place] CV LAB;  Service: Cardiovascular;  Laterality: N/A;  . TONSILLECTOMY    . VIDEO ASSISTED THORACOSCOPY (VATS)/ LOBECTOMY Right 12/26/2015  . wisdom tteeth ext      Current Medications: Prior to Admission medications   Medication Sig Start Date End Date Taking? Authorizing Provider  amiodarone (PACERONE) 200 MG tablet Take 1 tablet (200 mg total) by mouth 2 (two) times daily. Switch amiodarone to once daily dosing from  02/11/18 02/09/18   Jonetta Osgood, MD  Brinzolamide-Brimonidine [facility]) 1-0.2 % SUSP Place 2-3 drops into both eyes 3 (three) times daily.     [provider]  budesonide-formoterol (SYMBICORT) 80-4.5 MCG/ACT inhaler Inhale 2 puffs into the lungs 2 (two) times daily. 11/01/17   [name], MD  Multiple Vitamin (MULTIVITAMIN) tablet Take 1 tablet by mouth daily.    [provider]  Netarsudil-Latanoprost (ROCKLATAN) 0.02-0.005 % SOLN Place 1 drop into both eyes at bedtime.     Marylynn Pearson, MD  NONFORMULARY OR COMPOUNDED ITEM Outpatient PT-please treat and evaluate  Diagnosis: Deconditioning/debility due to prolonged hospitalization, kidney failure, heart failure 02/08/18   Ghimire, Henreitta Leber, MD  Nutritional Supplements (FEEDING SUPPLEMENT, NEPRO CARB STEADY,) LIQD Take 237 mLs by mouth 3 (three) times daily between meals. 02/09/18   Ghimire, Henreitta Leber, MD  pantoprazole (PROTONIX) 40 MG tablet Take 1 tablet (40 mg total) by mouth 2 (two) times daily. 11/30/17 01/29/18  Willia Craze, NP  potassium chloride SA (K-DUR,KLOR-CON) 20 MEQ tablet Take 2 tablets (40 mEq total) by mouth daily. 02/09/18   Ghimire, Henreitta Leber, MD  torsemide (DEMADEX) 100 MG tablet Take 1 tablet (100 mg total) by mouth daily. 02/09/18   Ghimire, Henreitta Leber, MD    Allergies:   Ambien [zolpidem tartrate] and Atorvastatin   Social History   Socioeconomic History  . Marital status: Married    Spouse name: Not on file  . Number of children: Not on file  . Years of education: Not on file  . Highest education level: Not on file  Occupational History  . Not on file  Social Needs  . Financial resource strain: Not on file  . Food insecurity:    Worry: Not on file    Inability: Not on file  . Transportation needs:    Medical: Not on file    Non-medical: Not on file  Tobacco Use  . Smoking status: Former Smoker    Packs/day: 1.00    Types: Cigarettes    Last attempt to quit: 01/19/2011     Years since quitting: 7.1  . Smokeless tobacco: Never Used  Substance and Sexual Activity  . Alcohol use: Yes    Alcohol/week: 1.0 standard drinks    Types: 1 Shots of liquor per week  . Drug use: No  . Sexual activity: Not on file  Lifestyle  . Physical activity:    Days per week: Not on file    Minutes per session: Not on file  . Stress: Not on file  Relationships  . Social connections:    Talks on phone: Not on file    Gets together: Not on file    Attends religious service: Not on file    Active member of club or organization: Not on file    Attends meetings of clubs or organizations: Not on file    Relationship status: Not on file  Other Topics  Concern  . Not on file  Social History Narrative  . Not on file     Family History:  The patient's family history includes Cerebral aneurysm in his mother; Diabetes in his mother and sister; Lung cancer in his father.   ROS:   Please see the history of present illness.    ROS All other systems reviewed and are negative.   PHYSICAL EXAM:   VS:  BP (!) 148/72   Pulse 84   Ht 5' 10.5" (1.791 m)   SpO2 (!) 87%   BMI 26.31 kg/m    GEN: Well nourished, well developed, in no acute distress  HEENT: normal  Neck: no JVD, carotid bruits, or masses Cardiac: RRR; 3/6 systolic murmurs, rubs, or gallops, 1+ BL LE edema has stockings  Respiratory:  clear to auscultation bilaterally, normal work of breathing GI: soft, nontender, nondistended, + BS MS: no deformity or atrophy  Skin: warm and dry, no rash Neuro:  Alert and Oriented x 3, Strength and sensation are intact Psych: euthymic mood, full affect  Wt Readings from Last 3 Encounters:  02/13/18 186 lb (84.4 kg)  02/08/18 182 lb 12.2 oz (82.9 kg)  01/09/18 219 lb 12.8 oz (99.7 kg)      Studies/Labs Reviewed:   EKG:  EKG is not  ordered today.   Recent Labs: 01/09/2018: B Natriuretic Peptide 598.0 01/21/2018: ALT 30 02/08/2018: Magnesium 1.6 02/13/2018: BUN 86; Creatinine,  Ser 3.52; Hemoglobin 8.4; Platelets 326; Potassium 3.6; Sodium 141   Lipid Panel    Component Value Date/Time   CHOL 177 09/05/2017 0918   TRIG 69 09/05/2017 0918   HDL 49 09/05/2017 0918   CHOLHDL 3.6 09/05/2017 0918   CHOLHDL 3.7 04/10/2013 0808   VLDL 16 04/10/2013 0808   LDLCALC 114 (H) 09/05/2017 0918    Additional studies/ records that were reviewed today include:   Echocardiogram: 12/2017 LV EF: 60% -   65%  ------------------------------------------------------------------- Indications:      Aortic stenosis 424.1.  ------------------------------------------------------------------- Study Conclusions  - Left ventricle: The cavity size was normal. Systolic function was   normal. The estimated ejection fraction was in the range of 60%   to 65%. Wall motion was normal; there were no regional wall   motion abnormalities. Doppler parameters are consistent with   abnormal left ventricular relaxation (grade 1 diastolic   dysfunction). Stroke volume/bsa: 48 ml/m^2. - Ventricular septum: Leftward ventricular septal shift in systole   with D-shaped septum suggests RV pressure overload. - Aortic valve: Mildly thickened, moderately calcified leaflets.   Valve mobility was moderately restricted. There was mild to   moderate stenosis. There was trivial regurgitation. Mean gradient   (S): 22 mm Hg. Valve previously described as bicuspid, unable to   define valve leaflets on today&'s exam due to calcification and   reduced mobility. - Mitral valve: There was trivial regurgitation. - Left atrium: The atrium was normal in size. - Right ventricle: The cavity size was moderately to severely   dilated. Wall thickness was normal. Systolic function was   moderately reduced. RV systolic pressure (S, est): 52 mm Hg. - Right atrium: The atrium was mildly dilated. - Tricuspid valve: There was mild regurgitation. - Pulmonary arteries: Systolic pressure was increased. PA peak   pressure:  52 mm Hg (S). - Inferior vena cava: The vessel was dilated. The respirophasic   diameter changes were blunted (< 50%), consistent with elevated   central venous pressure. - Pericardium, extracardiac: A small pericardial effusion was  identified primarly at the apex, with trivial circumferential   extension.  Impressions:  - Compared to the exam from 10/24/17, the degree of right   ventricular dilation has increased, and right ventricular   systolic function has decreased. The degree of leftward septal   shift and D shaped septum has worsened. There is now a   trivial-small pericardial effusion. Side by side comparison of   images performed.   RIGHT HEART CATH  Conclusion   Conclusions: 1. Normal left heart filling pressure. 2. Mildly elevated right heart filling pressure. 3. 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 4. Normal cardiac output/index.  Recommendations: 1. Medical therapy.        ASSESSMENT & PLAN:   1. PAF - Not on anticoagulation due to recent GI bleed. CHADSVASC score of 3. Sinus rhythm by exam. Continue amiodarone.   2. Bicuspid aortic valve with moderate AS Mean gradient (S): 22 mm Hg. By recent echo 12/2017, follow with yearly echo.   3. Chronic diastolic CHF - Volume status improving. Diuretics managed by nephrologist.   4. AKI - Creatinine improving. Pending lab work today   5. Anemia - denied recurrent bleeding. Pending CBC today.    Addendum: From [name], MD" "Thank you for your message. I have reviewed the results of his hospitalization after the endoscopy and his recent lab results. He may safely restart anticoagulation. He should return to see GI with any progressive anemia or overt bleeding that occurs after resuming anticoagulation. Thank you".   From Dr. Radford Pax, "Since he has CKD with creatinine > 3 would not use Xarelto or Eliquis. Start on Coumadin in coumadin clinic with INR goal around 2. Then see back in office  in 4 weeks by extender and CBC in 2 weeks".    Medication Adjustments/Labs and Tests Ordered: Current medicines are reviewed at length with the patient today.  Concerns regarding medicines are outlined above.  Medication changes, Labs and Tests ordered today are listed in the Patient Instructions below. Patient Instructions  Medication Instructions:   Your physician recommends that you continue on your current medications as directed. Please refer to the Current Medication list given to you today.   If you need a refill on your cardiac medications before your next appointment, please call your pharmacy.   Lab work: NONE ORDERED  TODAY    If you have labs (blood work) drawn today and your tests are completely normal, you will receive your results only by: [name] MyChart Message (if you have MyChart) OR . A paper copy in the mail If you have any lab test that is abnormal or we need to change your treatment, we will call you to review the results.  Testing/Procedures:  NONE ORDERED  TODAY   Follow-Up:  IN 3 MONTHS WITH DR TURNER   Any Other Special Instructions Will Be Listed Below (If Applicable).      Jarrett Soho, [place]  03/01/2018 12:48 PM    [facility] [place], [place], [place]  [address] Phone: [phone]; Fax: [phone]

## 2018-03-01 NOTE — Patient Instructions (Signed)
Medication Instructions:   Your physician recommends that you continue on your current medications as directed. Please refer to the Current Medication list given to you today.   If you need a refill on your cardiac medications before your next appointment, please call your pharmacy.   Lab work: NONE ORDERED  TODAY    If you have labs (blood work) drawn today and your tests are completely normal, you will receive your results only by: [name] MyChart Message (if you have MyChart) OR . A paper copy in the mail If you have any lab test that is abnormal or we need to change your treatment, we will call you to review the results.  Testing/Procedures:  NONE ORDERED  TODAY   Follow-Up:  IN 3 MONTHS WITH DR TURNER   Any Other Special Instructions Will Be Listed Below (If Applicable).

## 2018-03-02 ENCOUNTER — Ambulatory Visit: Payer: Medicare Other | Admitting: Physical Therapy

## 2018-03-02 ENCOUNTER — Telehealth: Payer: Self-pay | Admitting: Physical Therapy

## 2018-03-02 NOTE — Telephone Encounter (Signed)
Called and spoke with pt who states he called last night to cancel but it was after hours. He apologized for the mixup and confirmed his next appointment.    8:17 AM,03/02/18 Sherol Dade PT, [place] [facility]

## 2018-03-06 ENCOUNTER — Telehealth: Payer: Self-pay | Admitting: Pharmacist

## 2018-03-06 ENCOUNTER — Other Ambulatory Visit: Payer: Self-pay | Admitting: Cardiology

## 2018-03-06 MED ORDER — WARFARIN SODIUM 5 MG PO TABS: 5.0000 mg | ORAL_TABLET | Freq: Every day | ORAL | 0 refills | Status: DC

## 2018-03-06 NOTE — Telephone Encounter (Signed)
Andrew Guerra, RMA  Supple, Harlon Flor, RPH        Michaelyn Barter, RN Andrew Guerra, RMA     Previous Messages    ----- Message -----  From: Andrew Guerra, [place]  Sent: 03/06/2018  3:04 PM EST  To: Rebeca [facility] Triage   Please set up coumadin check visit with pharmacist, APP and lab as recommended.  ----- Message -----  From: Sueanne Margarita, MD  Sent: 03/06/2018  2:23 PM EST  To: Andrew Kail, PA   Vin,   CORRECTION!! Since he has CKD with creatinine > 3 would not use Xarelto or Eliquis. Start on Coumadin in coumadin clinic with INR goal around 2. Then see back in office in 4 weeks by extender and CBC in 2 weeks.

## 2018-03-06 NOTE — Telephone Encounter (Signed)
Called pt and advised him to start taking warfarin 5mg  daily - first dose tomorrow. New Coumadin appt made for early next week.

## 2018-03-06 NOTE — Telephone Encounter (Signed)
*  STAT* If patient is at the pharmacy, call can be transferred to refill team.   1. Which medications need to be refilled? (please list name of each medication and dose if known) warfarin (COUMADIN) 5 MG tablet  2. Which pharmacy/location (including street and city if local pharmacy) is medication to be sent to? [facility]  3. Do they need a 30 day or 90 day supply? Pharmacist wanted to make sure Dr Radford Pax was aware that patient is also on amiodarone. They want to verify that patient still needs to take the Warfarin

## 2018-03-06 NOTE — Telephone Encounter (Signed)
I am aware he is on Amio and he will be followed closely in our Coumadin clinic.  Please have him followup in coumadin clinic on Wednesday 2/19

## 2018-03-07 ENCOUNTER — Telehealth: Payer: Self-pay | Admitting: Family Medicine

## 2018-03-07 ENCOUNTER — Ambulatory Visit: Payer: Medicare Other | Admitting: Physical Therapy

## 2018-03-07 ENCOUNTER — Encounter: Payer: Self-pay | Admitting: Physical Therapy

## 2018-03-07 ENCOUNTER — Telehealth: Payer: Self-pay | Admitting: *Deleted

## 2018-03-07 DIAGNOSIS — M6281 Muscle weakness (generalized): Secondary | ICD-10-CM | POA: Diagnosis not present

## 2018-03-07 DIAGNOSIS — R2681 Unsteadiness on feet: Secondary | ICD-10-CM

## 2018-03-07 DIAGNOSIS — I480 Paroxysmal atrial fibrillation: Secondary | ICD-10-CM

## 2018-03-07 NOTE — Telephone Encounter (Signed)
Informed pharmacy that pt ok to take both medications and that our office is monitoring pt while on both.

## 2018-03-07 NOTE — Telephone Encounter (Signed)
-----   Message from Michaelyn Barter, RN sent at 03/06/2018  3:20 PM EST -----  ----- Message ----- From: Leanor Kail, PA Sent: 03/06/2018   3:04 PM EST To: Rebec[facility] Triage  Please set up coumadin check visit with pharmacist, APP and lab as recommended.  ----- Message ----- From: Sueanne Margarita, MD Sent: 03/06/2018   2:23 PM EST To: Leanor Kail, PA  Vin,  CORRECTION!!  Since he has CKD with creatinine > 3 would not use Xarelto or Eliquis.  Start on Coumadin in coumadin clinic with INR goal around 2.  Then see back in office in 4 weeks by extender and CBC in 2 weeks.  Fransico Him, MD ----- Message ----- From: Leanor Kail, PA Sent: 03/06/2018   8:15 AM EST To: Sueanne Margarita, MD  This is recommendations by Dr. Archie Patten, MD  Leanor Kail, [place]    "Thank you for your message. I have reviewed the results of his hospitalization after the endoscopy and his recent lab results. He may safely restart anticoagulation. He should return to see GI with any progressive anemia or overt bleeding that occurs after resuming anticoagulation. Thank you."     ----- Message ----- From: Sueanne Margarita, MD Sent: 03/05/2018   9:01 AM EST To: Leanor Kail, PA  I cannot find the GI recommendations  Traci ----- Message ----- From: Leanor Kail, PA Sent: 03/03/2018   6:39 AM EST To: Sueanne Margarita, MD  Please review GI recommendations.

## 2018-03-07 NOTE — Patient Instructions (Signed)
Access Code: [email or web address]  URL: [email or web address]  Date: 03/07/2018  Prepared by: Sherol Dade   Exercises  Seated March with Resistance - 10 reps - 3 sets - 1x daily - 7x weekly  Side Stepping with Counter Support - 15-20 reps - 3 sets - 1x daily - 7x weekly  Heel rises with counter support - 15-20 reps - 1 sets - 1x daily - 7x weekly  Standing Bicep Curls Supinated with Dumbbells - 10 reps - 2 sets - 1x daily - 7x weekly  Tandem Stance with Support - 10 reps - 3 sets - 1x daily - 7x weekly  Standing Row with Anchored Resistance - 10-15 reps - 2 sets - 1x daily - 7x weekly     [facility] [address] [place], [place] [address] Phone # [phone] Fax [phone]

## 2018-03-07 NOTE — Therapy (Signed)
[facility] [address] [place], [place], [address] Phone: [phone]   Fax:  [phone]  Physical Therapy Treatment  Patient Details  Name: Andrew Guerra MRN: [id number] Date of Birth: January 31, 1940 Referring Provider (PT): Oren Binet, MD   Encounter Date: 03/07/2018  PT End of Session - 03/07/18 0812    Visit Number  5    Date for PT Re-Evaluation  04/17/18    Authorization Type  [employer] medicare     Authorization Time Period  02/16/18 to 04/17/18    Authorization - Visit Number  5    Authorization - Number of Visits  10    PT Start Time  0800    PT Stop Time  0840    PT Time Calculation (min)  40 min    Equipment Utilized During Treatment  Oxygen   2L   Activity Tolerance  No increased pain;Patient tolerated treatment well    Behavior During Therapy  [facility] for tasks assessed/performed       Past Medical History:  Diagnosis Date  . 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  .Marland KitchenCataract    right eye - surgery to remove  . Elevated PSA 11/29/2016   [facility] is followed [facility].  Most recent PSA near 11.  He has had atypia on biopsy.  November 2008  . Essential hypertension, benign 06/08/2012  . Full dentures   . Glaucoma   . Heart murmur    since childhood, never has caused any problems  . History of rheumatic fever 11/04/2015  . Hyperlipidemia 04/25/2013  . Hypertension   . Malignant neoplasm of left lung ([facility] 02/08/2017  . RBBB 07/12/2017  . Sickle cell trait ([facility]    no problems per patient    Past Surgical History:  Procedure Laterality Date  . BIOPSY  11/11/2017   Procedure: BIOPSY;  Surgeon: CLavena Bullion DO;  [facility];  Service: Gastroenterology;;  . BIOPSY  01/10/2018   Procedure: BIOPSY;  Surgeon: BThornton Park MD;  [facility]   Service: Gastroenterology;;  . cataract eye surgery Right   . COLONOSCOPY  11/2009   hx polyps/[place]  . ESOPHAGOGASTRODUODENOSCOPY N/A 11/11/2017   Procedure: ESOPHAGOGASTRODUODENOSCOPY (EGD);  Surgeon: CLavena Bullion DO;  [facility];  Service: Gastroenterology;  Laterality: N/A;  . ESOPHAGOGASTRODUODENOSCOPY (EGD) WITH PROPOFOL N/A 01/10/2018   Procedure: ESOPHAGOGASTRODUODENOSCOPY (EGD) WITH PROPOFOL;  Surgeon: BThornton Park MD;  [facility]  Service: Gastroenterology;  Laterality: N/A;  . IR FLUORO GUIDE CV LINE RIGHT  01/16/2018  . IR FLUORO GUIDE CV LINE RIGHT  01/31/2018  . IR [place] VASC ACCESS RIGHT  01/16/2018  . IR [place] VASC ACCESS RIGHT  01/31/2018  . PROSTATE BIOPSY    . right eye surgery     to lower eye pressure  . RIGHT HEART CATH N/A 01/19/2018   Procedure: RIGHT HEART CATH;  Surgeon: ENelva Bush MD;  [facility];  Service: Cardiovascular;  Laterality: N/A;  . TONSILLECTOMY    . VIDEO ASSISTED THORACOSCOPY (VATS)/ LOBECTOMY Right 12/26/2015  . wisdom tteeth ext      There were no vitals filed for this visit.  Subjective Assessment - 03/07/18 0805    Subjective  Pt states things are going well. He is trying to walk more at home. Still has some balance  problems.     Pertinent History  recent hospitalization due to kidney failure, heart failure    Limitations  House hold activities;Walking    Patient Stated Goals  improve his stamina and strength to allow him to get in and out of the car with improved ease     Currently in Pain?  No/denies         [facility] PT Assessment - 03/07/18 0001      Transfers   Five time sit to stand comments   11 sec, UE support                    [facility] Adult PT Treatment/Exercise - 03/07/18 0001      Knee/Hip Exercises: Aerobic   Nustep  L1 to L2 intervals 60 sec each for 2 rounds and decreased to L1, x5 min PT present to discuss slow pace and focus on breathing.        Knee/Hip Exercises: Standing   Heel Raises  Both;1 set;20 reps    Hip Flexion  Stengthening;Both;10 reps;Knee bent;2 sets    Hip Flexion Limitations  2# ankle weights with 1 finger support       Knee/Hip Exercises: Seated   Other Seated Knee/Hip Exercises  Rt hip march step laterally over pool noodle x5 reps AROM with x5 reps AAROM    Hamstring Curl  Strengthening;2 sets;Both;15 reps    Hamstring Limitations  green TB           Balance Exercises - 03/07/18 0815      Balance Exercises: Standing   Standing Eyes Closed  Narrow base of support (BOS);Foam/compliant surface;3 reps;20 secs    Other Standing Exercises  NBOS on foam with trunk rotation holding blue weighted ball         PT Education - 03/07/18 0840    Education Details  updated and reviewed HEP; improvements in functional testing     Person(s) Educated  Patient    Methods  Explanation;Handout    Comprehension  Verbalized understanding       PT Short Term Goals - 03/07/18 0844      PT SHORT TERM GOAL #1   Title  Pt will be independent with his initial HEP to improve LE strength and stamina.     Time  4    Period  Weeks    Status  Achieved        PT Long Term Goals - 03/07/18 0844      PT LONG TERM GOAL #1   Title  Pt will have increase in LE strength to atleast 4/5MMT which will improve his efficiceny with daily tasks.     Time  8    Period  Weeks    Status  On-going      PT LONG TERM GOAL #2   Title  Pt will have increased LE strength and power evident by his ability to complete 5x sit to stand without UE support in less than 14 sec.     Baseline  11 sec with UE     Time  8    Period  Weeks    Status  Partially Met      PT LONG TERM GOAL #3   Title  Pt will complete TUG in less than 14 sec with LRAD to reflect improvements in his stability with ambulation.    Time  8    Period  Weeks    Status  New  PT LONG TERM GOAL #4   Title  Pt will be independent with his advanced HEP at home and  the gym to allow for safe transition following discharge from PT.     Time  8    Period  Weeks    Status  New      PT LONG TERM GOAL #5   Title  Pt will report atleast 75% improvement in his strength and participation in daily activity from the start of PT to decrease caregiver support.     Time  8    Period  Weeks    Status  New      PT LONG TERM GOAL #6   Title  Pt will have increased stamina and endurance evident by his ability to ambulate atleast 1430f during 6 MWT.    Time  8    Period  Weeks    Status  New            Plan - 03/07/18 06387   Clinical Impression Statement  Pt is making steady progress towards his goals, demonstrating atleast 6 sec improvement on his 5x sit to stand this session. He continues to work diligently on his HEP with minimal difficulty with set up. Therapist made some updates to his HEP with tandem stance to further promote steadiness with standing throughout the day. Pt still has difficulty with Rt hip flexion during seated marching activity, but this appears to be improved overall. Will continue with current POC.    Rehab Potential  Good    PT Frequency  2x / week    PT Duration  8 weeks    PT Treatment/Interventions  ADLs/Self Care Home Management;Gait training;Balance training;Therapeutic exercise;Therapeutic activities;Functional mobility training;Stair training;Neuromuscular re-education;Patient/family education;Manual techniques;Passive range of motion    PT Next Visit Plan  hip flexion in side/supine; focus on standing balance on unstable surfaces; hip flexibility     PT Home Exercise Plan  [email or web address]     Consulted and Agree with Plan of Care  Patient       Patient will benefit from skilled therapeutic intervention in order to improve the following deficits and impairments:  Decreased activity tolerance, Decreased strength, Impaired flexibility, Improper body mechanics, Increased edema, Decreased endurance, Decreased mobility  Visit  Diagnosis: Muscle weakness (generalized)  Unsteadiness on feet     Problem List Patient Active Problem List   Diagnosis Date Noted  . Acute renal failure ([facility]   . PAF (paroxysmal atrial fibrillation) ([facility] [place]   . Pressure injury of skin 01/17/2018  . Intestinal metaplasia of gastric mucosa   . Iron deficiency anemia   . Anasarca   . Malnutrition of moderate degree 01/10/2018  . GI bleed 01/09/2018  . Acute respiratory failure with hypoxia ([facility] 11/12/2017  . Gastritis and gastroduodenitis   . Acute gastric ulcer without hemorrhage or perforation   . Duodenal ulcer   . Paraesophageal hernia   . Upper GI bleeding 11/09/2017  . CKD (chronic kidney disease), stage III ([facility] [place] 11/09/2017  . Normocytic anemia 11/09/2017  . Acute on chronic diastolic CHF (congestive heart failure) ([facility] 11/09/2017  . Melena   . RBBB 07/12/2017  . 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 Malignant neoplasm of left lung ([place] 02/08/2017  . Elevated PSA 11/29/2016  . Heart murmur 11/04/2015  . History of active rheumatic fever 11/04/2015  . Solitary pulmonary nodule 08/31/2013  . Renal malignant tumor ([facility] 08/31/2013  . Thyroid nodule 08/31/2013  . Hyperlipidemia 04/25/2013  .  Essential hypertension, benign 06/08/2012    8:47 AM,03/07/18 Sherol Dade PT, DPT [facility] [address] [place], [place], [address] Phone: [phone]   Fax:  [phone]  Name: Andrew Guerra MRN: [id number] Date of Birth: 1940/10/03

## 2018-03-07 NOTE — Telephone Encounter (Signed)
Pt is not taking his first dose of warfarin until today 2/18. Will keep follow up visit on 2/25.

## 2018-03-07 NOTE — Telephone Encounter (Signed)
Called pt re: message below. Pt needs a CBC around 03/21/2018 and needs a 4 week f/u with APP (04/04/2018 @ 9:30) Left a message for pt to call back.

## 2018-03-07 NOTE — Telephone Encounter (Signed)
Patient seen his heart doctor for follow up and he wants to put him on coumadin 5 mg. Patient has some concern about this medication and wanting to talk with you about this.He doesn't want to take medication.

## 2018-03-09 ENCOUNTER — Ambulatory Visit: Payer: Medicare Other | Admitting: Physical Therapy

## 2018-03-09 NOTE — Telephone Encounter (Signed)
Please call patient again tomorrow after seeing Dr. Posey Pronto to find out what his thoughts are.  Please try to explain to the husband and the wife that the patient needs to be on Coumadin for his atrial fibrillation which can increase his risk of a stroke if he is not on it.

## 2018-03-09 NOTE — Telephone Encounter (Addendum)
Pt needs to start warfarin. Called pt and his wife who are both very confused and state that none of their doctors are telling them the same thing about his medications.  Again told pt that his cardiologist wants him to start warfarin and his GI doctor is in agreement. His retail pharmacist told him it was contraindicated because of a drug interaction. Told him this was incorrect since the only interaction medication is amiodarone, which is not a contraindication and is fine to take since we will be monitoring his INR. Also called pharmacy to tell them to fill warfarin for pt, they stated they only called to clarify we were aware that pt is on amiodarone and they have had rx ready for 3 days.   Pt states he just spoke with our office who said he could wait to talk with Dr Posey Pronto (appt today) about starting warfarin. Dr Posey Pronto is his nephrologist and should not have a strong opinion on pt's anticoagulation for afib. Again instructed pt to start warfarin today and will keep f/u INR check on 2/25.   After 10 minute discussion with both pt and his wife, they still seemed very confused. Advised pt to call clinic again if he does not start warfarin tonight as we will need to move his INR appt.

## 2018-03-09 NOTE — Telephone Encounter (Signed)
Spoke with Benjamine Mola, [place] on file. She informed me that pt hasn't started Coumadin as of yet, that the pharmacist said it was an interaction with another medication he is already taking prescribed by Dr. Posey Pronto, so pt was waiting to see him today to clarify and decide whether to start or not. Mrs. Rodin was advised to ask Dr. Posey Pronto to cc our Coumadin clinic on so we will know what the plan of care is and if we need to change pt's Coumadiin appt for [facility] Tuesday, 03/14/2018.  Did not schedule pts CBC since pt hadn't started Coumadin, and will just repeat at f/u appt 04/04/2018.  Will route to Coumadin clinic to make aware.

## 2018-03-09 NOTE — Telephone Encounter (Signed)
2nd attempt to reach pt re: message below.  03/09/2018

## 2018-03-09 NOTE — Telephone Encounter (Signed)
[phone]  But patient has an appt at 2:00 today with Dr. Posey Pronto.  Calling again (see both notes from 2/18) about the Coumadin.  He said the heart doctor wants him to start the medicine right away but he hasn't until he hears from Dr. Nicki Reaper.  They are confused on why he is taking it. (Husband and wife both on the phone at the same time so it was hard to understand what they were saying.)  Patient has an appt at 2:00 so won't be home till later today.

## 2018-03-10 NOTE — Telephone Encounter (Signed)
I spoke with Solmon Ice at [facility] she will send labs and office visit over.

## 2018-03-10 NOTE — Telephone Encounter (Signed)
Received the labs and ov note. It is with patient chart for review at office visit on Monday.

## 2018-03-10 NOTE — Telephone Encounter (Signed)
I spoke with the patient regarding his Coumadin He is concerned about the potential for bleeding He is coming on Monday to have further discussion  Nurses- please connect with [facility] They saw him yesterday If possible have them fax what labs they did since we are seeing the patient on Monday that would be helpful

## 2018-03-13 ENCOUNTER — Ambulatory Visit: Payer: Medicare Other | Admitting: Family Medicine

## 2018-03-14 ENCOUNTER — Ambulatory Visit: Payer: Medicare Other | Admitting: Physical Therapy

## 2018-03-14 ENCOUNTER — Encounter: Payer: Self-pay | Admitting: Physical Therapy

## 2018-03-14 DIAGNOSIS — M6281 Muscle weakness (generalized): Secondary | ICD-10-CM | POA: Diagnosis not present

## 2018-03-14 DIAGNOSIS — R2681 Unsteadiness on feet: Secondary | ICD-10-CM

## 2018-03-14 NOTE — Telephone Encounter (Addendum)
LMOM - pt did not show for new Coumadin appt. There is a phone note in Epic that pt also called his PCP with concern about starting Coumadin. Per Dr Wolfgang Phoenix on 2/20, "I spoke with the patient regarding his Coumadin. He is concerned about the potential for bleeding. He is coming on Monday to have further discussion." Do not see that pt had an appt on Monday 2/24 with PCP, he is scheduled for an appt on 3/3 though. Will continue to attempt to reach pt to discuss the need to start warfarin for his afib to prevent stroke. Will need to schedule a new Coumadin appt ~5 days after starting Coumadin to check his INR.

## 2018-03-14 NOTE — Therapy (Signed)
[facility] [address], [place], [place], [address] Phone: [phone]   Fax:  [phone]  Physical Therapy Treatment  Patient Details  Name: Andrew Guerra MRN: [id number] Date of Birth: December 04, 1940 Referring Provider (PT): Oren Binet, MD   Encounter Date: 03/14/2018  PT End of Session - 03/14/18 0807    Visit Number  6    Date for PT Re-Evaluation  04/17/18    Authorization Type  [employer] medicare     Authorization Time Period  02/16/18 to 04/17/18    Authorization - Visit Number  6    Authorization - Number of Visits  10    PT Start Time  0745    PT Stop Time  0825    PT Time Calculation (min)  40 min    Equipment Utilized During Treatment  Oxygen   2L   Activity Tolerance  No increased pain;Patient tolerated treatment well    Behavior During Therapy  [facility] for tasks assessed/performed       Past Medical History:  Diagnosis Date  . 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  .Marland KitchenCataract    right eye - surgery to remove  . Elevated PSA 11/29/2016   [facility].  Most recent PSA near 11.  He has had atypia on biopsy.  November 2008  . Essential hypertension, benign 06/08/2012  . Full dentures   . Glaucoma   . Heart murmur    since childhood, never has caused any problems  . History of rheumatic fever 11/04/2015  . Hyperlipidemia 04/25/2013  . Hypertension   . Malignant neoplasm of left lung ([place] 02/08/2017  . RBBB 07/12/2017  . Sickle cell trait (HL[place]    no problems per patient    Past Surgical History:  Procedure Laterality Date  . BIOPSY  11/11/2017   Procedure: BIOPSY;  Surgeon: CLavena Bullion DO;  [facility];  Service: Gastroenterology;;  . BIOPSY  01/10/2018   Procedure: BIOPSY;  Surgeon: BThornton Park MD;  [place]: [place]   Service: Gastroenterology;;  . cataract eye surgery Right   . COLONOSCOPY  11/2009   hx polyps/[place]  . ESOPHAGOGASTRODUODENOSCOPY N/A 11/11/2017   Procedure: ESOPHAGOGASTRODUODENOSCOPY (EGD);  Surgeon: CLavena Bullion DO;  [facility];  Service: Gastroenterology;  Laterality: N/A;  . ESOPHAGOGASTRODUODENOSCOPY (EGD) WITH PROPOFOL N/A 01/10/2018   Procedure: ESOPHAGOGASTRODUODENOSCOPY (EGD) WITH PROPOFOL;  Surgeon: BThornton Park MD;  [place]  Service: Gastroenterology;  Laterality: N/A;  . IR FLUORO GUIDE CV LINE RIGHT  01/16/2018  . IR FLUORO GUIDE CV LINE RIGHT  01/31/2018  . IR [place] VASC ACCESS RIGHT  01/16/2018  . IR [place] VASC ACCESS RIGHT  01/31/2018  . PROSTATE BIOPSY    . right eye surgery     to lower eye pressure  . RIGHT HEART CATH N/A 01/19/2018   Procedure: RIGHT HEART CATH;  Surgeon: ENelva Bush MD;  [facility];  Service: Cardiovascular;  Laterality: N/A;  . TONSILLECTOMY    . VIDEO ASSISTED THORACOSCOPY (VATS)/ LOBECTOMY Right 12/26/2015  . wisdom tteeth ext      There were no vitals filed for this visit.  Subjective Assessment - 03/14/18 0753    Subjective  Pt reports that things are going well. He tried some of his exercises and they were difficult.  Pertinent History  recent hospitalization due to kidney failure, heart failure    Limitations  House hold activities;Walking    Patient Stated Goals  improve his stamina and strength to allow him to get in and out of the car with improved ease     Currently in Pain?  No/denies                       [facility] Adult PT Treatment/Exercise - 03/14/18 0001      Knee/Hip Exercises: Stretches   Active Hamstring Stretch  2 reps;Both;10 seconds    Active Hamstring Stretch Limitations  discontinued due to stomach cramps      Knee/Hip Exercises: Aerobic   Nustep  L1 x5 min       Knee/Hip Exercises: Standing   Knee Flexion  Strengthening;Both;2  sets;10 reps    Knee Flexion Limitations  1 finger support, cues needed, 2#  ankle weights     Forward Step Up  3 sets;Both;5 reps    Forward Step Up Limitations  1 UE support      Knee/Hip Exercises: Seated   Sit to Sand  2 sets;10 reps;with UE support   on foam pad      Knee/Hip Exercises: Supine   Other Supine Knee/Hip Exercises  hooklying march x10 reps              PT Education - 03/14/18 0815    Education Details  technique with therex    Person(s) Educated  Patient    Methods  Explanation;Verbal cues    Comprehension  Returned demonstration;Verbalized understanding       PT Short Term Goals - 03/07/18 0844      PT SHORT TERM GOAL #1   Title  Pt will be independent with his initial HEP to improve LE strength and stamina.     Time  4    Period  Weeks    Status  Achieved        PT Long Term Goals - 03/07/18 0844      PT LONG TERM GOAL #1   Title  Pt will have increase in LE strength to atleast 4/5MMT which will improve his efficiceny with daily tasks.     Time  8    Period  Weeks    Status  On-going      PT LONG TERM GOAL #2   Title  Pt will have increased LE strength and power evident by his ability to complete 5x sit to stand without UE support in less than 14 sec.     Baseline  11 sec with UE     Time  8    Period  Weeks    Status  Partially Met      PT LONG TERM GOAL #3   Title  Pt will complete TUG in less than 14 sec with LRAD to reflect improvements in his stability with ambulation.    Time  8    Period  Weeks    Status  New      PT LONG TERM GOAL #4   Title  Pt will be independent with his advanced HEP at home and the gym to allow for safe transition following discharge from PT.     Time  8    Period  Weeks    Status  New      PT LONG TERM GOAL #5   Title  Pt will report atleast 75% improvement in his strength  and participation in daily activity from the start of PT to decrease caregiver support.     Time  8    Period  Weeks    Status   New      PT LONG TERM GOAL #6   Title  Pt will have increased stamina and endurance evident by his ability to ambulate atleast 1463f during 6 MWT.    Time  8    Period  Weeks    Status  New            Plan - 03/14/18 04944   Clinical Impression Statement  Pt continues to see functional improvements at home, noting he was able to use his stairs for the first time yesterday. Attempted supine hip flexion strengthening and flexibility exercises, however this was discontinued due to pt's report of stomach cramping. He was able to complete all other standing exercises with intermittent rest breaks provided. End of session, pt had better understanding of his HEP. Will continue with current POC to improve pt strength and safety with daily activity.     Rehab Potential  Good    PT Frequency  2x / week    PT Duration  8 weeks    PT Treatment/Interventions  ADLs/Self Care Home Management;Gait training;Balance training;Therapeutic exercise;Therapeutic activities;Functional mobility training;Stair training;Neuromuscular re-education;Patient/family education;Manual techniques;Passive range of motion    PT Next Visit Plan  TUG; hip flexion in supine if pt able; focus on standing balance on unstable surfaces; hip flexibility     PT Home Exercise Plan  [email or web address]     Consulted and Agree with Plan of Care  Patient       Patient will benefit from skilled therapeutic intervention in order to improve the following deficits and impairments:  Decreased activity tolerance, Decreased strength, Impaired flexibility, Improper body mechanics, Increased edema, Decreased endurance, Decreased mobility  Visit Diagnosis: Muscle weakness (generalized)  Unsteadiness on feet     Problem List Patient Active Problem List   Diagnosis Date Noted  . Acute renal failure (HF[place]   . PAF (paroxysmal atrial fibrillation) (HU[place]   . Pressure injury of skin 01/17/2018  . Intestinal metaplasia of gastric mucosa   . Iron  deficiency anemia   . Anasarca   . Malnutrition of moderate degree 01/10/2018  . GI bleed 01/09/2018  . Acute respiratory failure with hypoxia (H[place] 11/12/2017  . Gastritis and gastroduodenitis   . Acute gastric ulcer without hemorrhage or perforation   . Duodenal ulcer   . Paraesophageal hernia   . Upper GI bleeding 11/09/2017  . CKD (chronic kidney disease), stage III (HN[place] 11/09/2017  . Normocytic anemia 11/09/2017  . Acute on chronic diastolic CHF (congestive heart failure) (HMont[place] 11/09/2017  . Melena   . RBBB 07/12/2017  . 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 Malignant neoplasm of left lung (HAmes 02/08/2017  . Elevated PSA 11/29/2016  . Heart murmur 11/04/2015  . History of active rheumatic fever 11/04/2015  . Solitary pulmonary nodule 08/31/2013  . Renal malignant tumor (H[place] 08/31/2013  . Thyroid nodule 08/31/2013  . Hyperlipidemia 04/25/2013  . Essential hypertension, benign 06/08/2012   8:31 AM,03/14/18 SSherol DadePT, DPT CTexannaat [facility] [address] [place] [address]Phone: [phone]  Fax:  [phone] Name: JJOHNAVON MCCLAFFERTYMRN: [id number] of Birth: 1July 20, 1942

## 2018-03-15 ENCOUNTER — Encounter: Payer: Self-pay | Admitting: Family Medicine

## 2018-03-16 ENCOUNTER — Encounter: Payer: Self-pay | Admitting: Physical Therapy

## 2018-03-16 ENCOUNTER — Ambulatory Visit: Payer: Medicare Other | Admitting: Physical Therapy

## 2018-03-16 DIAGNOSIS — M6281 Muscle weakness (generalized): Secondary | ICD-10-CM

## 2018-03-16 DIAGNOSIS — R2681 Unsteadiness on feet: Secondary | ICD-10-CM

## 2018-03-16 NOTE — Patient Instructions (Signed)
Access Code: [email or web address]  URL: [email or web address]  Date: 03/16/2018  Prepared by: Sherol Dade   Exercises  Heel rises with counter support - 20 reps - 1 sets - 1x daily - 7x weekly  Standing Bicep Curls Supinated with Dumbbells - 10 reps - 2 sets - 1x daily - 7x weekly  Tandem Stance with Support - 10 reps - 3 sets - 1x daily - 7x weekly  Hooklying Isometric Hip Flexion - 10 reps - 3 sets - 1x daily - 7x weekly  Standing Row with Anchored Resistance - 10-15 reps - 2 sets - 1x daily - 7x weekly  Supine Active Straight Leg Raise - 5 reps - 2-3 sets - 1x daily - 7x weekly  Hip Extension with Resistance Loop - 10 reps - 2 sets - 1x daily - 7x weekly  Side Stepping with Resistance at Feet - 10 reps - 2 sets - 1x daily - 7x weekly     [facility] [address], [place],  [address] Phone # [phone] Fax [phone]

## 2018-03-16 NOTE — Therapy (Signed)
[facility] [address] [place], [place], [address] Phone: [phone]   Fax:  [phone]  Physical Therapy Treatment  Patient Details  Name: Andrew Guerra MRN: [id number] Date of Birth: 1940-02-21 Referring Provider (PT): Oren Binet, MD   Encounter Date: 03/16/2018  PT End of Session - 03/16/18 0837    Visit Number  7    Date for PT Re-Evaluation  04/17/18    Authorization Type  [employer] medicare     Authorization Time Period  02/16/18 to 04/17/18    Authorization - Visit Number  7    Authorization - Number of Visits  10    PT Start Time  0800    PT Stop Time  0839    PT Time Calculation (min)  39 min    Equipment Utilized During Treatment  Oxygen   2L   Activity Tolerance  No increased pain;Patient tolerated treatment well    Behavior During Therapy  [facility] for tasks assessed/performed       Past Medical History:  Diagnosis Date  . 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  .Marland KitchenCataract    right eye - surgery to remove  . Elevated PSA 11/29/2016   [facility].  Most recent PSA near 11.  He has had atypia on biopsy.  November 2008  . Essential hypertension, benign 06/08/2012  . Full dentures   . Glaucoma   . Heart murmur    since childhood, never has caused any problems  . History of rheumatic fever 11/04/2015  . Hyperlipidemia 04/25/2013  . Hypertension   . Malignant neoplasm of left lung (HChe[place] 02/08/2017  . RBBB 07/12/2017  . Sickle cell trait (HPal[place]    no problems per patient    Past Surgical History:  Procedure Laterality Date  . BIOPSY  11/11/2017   Procedure: BIOPSY;  Surgeon: CLavena Bullion DO;  [facility];  Service: Gastroenterology;;  . BIOPSY  01/10/2018   Procedure: BIOPSY;  Surgeon: BThornton Park MD;  [place]: [place]   Service: Gastroenterology;;  . cataract eye surgery Right   . COLONOSCOPY  11/2009   hx polyps/[place]  . ESOPHAGOGASTRODUODENOSCOPY N/A 11/11/2017   Procedure: ESOPHAGOGASTRODUODENOSCOPY (EGD);  Surgeon: CLavena Bullion DO;  [facility];  Service: Gastroenterology;  Laterality: N/A;  . ESOPHAGOGASTRODUODENOSCOPY (EGD) WITH PROPOFOL N/A 01/10/2018   Procedure: ESOPHAGOGASTRODUODENOSCOPY (EGD) WITH PROPOFOL;  Surgeon: BThornton Park MD;  Location: [place]  Service: Gastroenterology;  Laterality: N/A;  . IR FLUORO GUIDE CV LINE RIGHT  01/16/2018  . IR FLUORO GUIDE CV LINE RIGHT  01/31/2018  . IR [place]GUIDE VASC ACCESS RIGHT  01/16/2018  . IR [place]GUIDE VASC ACCESS RIGHT  01/31/2018  . PROSTATE BIOPSY    . right eye surgery     to lower eye pressure  . RIGHT HEART CATH N/A 01/19/2018   Procedure: RIGHT HEART CATH;  Surgeon: ENelva Bush MD;  Location: [facility];  Service: Cardiovascular;  Laterality: N/A;  . TONSILLECTOMY    . VIDEO ASSISTED THORACOSCOPY (VATS)/ LOBECTOMY Right 12/26/2015  . wisdom tteeth ext      There were no vitals filed for this visit.  Subjective Assessment - 03/16/18 0803    Subjective  Pt states that he has been working on his HEP. He does have some difficulty getting up out  of the floor.     Pertinent History  recent hospitalization due to kidney failure, heart failure    Limitations  House hold activities;Walking    Patient Stated Goals  improve his stamina and strength to allow him to get in and out of the car with improved ease     Currently in Pain?  No/denies                       [facility] Adult PT Treatment/Exercise - 03/16/18 0001      Knee/Hip Exercises: Machines for Strengthening   Total Gym Leg Press  seat 8 single leg #60 2x10 reps each LE       Knee/Hip Exercises: Standing   Heel Raises  Both;1 set;20 reps    Heel Raises Limitations  2 finger support     Hip Abduction  Stengthening;Both;1 set;10 reps     Abduction Limitations  red TB around toes     Hip Extension  Stengthening;Right;Left;1 set;10 reps    Extension Limitations  yellow TB around ankles     Forward Step Up  Both;2 sets;10 reps    Forward Step Up Limitations  1 UE support     Other Standing Knee Exercises  BUE rows with blue TB 2x15 reps    Other Standing Knee Exercises  BUE pressdown with green TB 2x15 reps              PT Education - 03/16/18 0836    Education Details  updated HEP    Person(s) Educated  Patient    Methods  Explanation;Handout;Verbal cues    Comprehension  Verbalized understanding;Returned demonstration       PT Short Term Goals - 03/07/18 0844      PT SHORT TERM GOAL #1   Title  Pt will be independent with his initial HEP to improve LE strength and stamina.     Time  4    Period  Weeks    Status  Achieved        PT Long Term Goals - 03/07/18 0844      PT LONG TERM GOAL #1   Title  Pt will have increase in LE strength to atleast 4/5MMT which will improve his efficiceny with daily tasks.     Time  8    Period  Weeks    Status  On-going      PT LONG TERM GOAL #2   Title  Pt will have increased LE strength and power evident by his ability to complete 5x sit to stand without UE support in less than 14 sec.     Baseline  11 sec with UE     Time  8    Period  Weeks    Status  Partially Met      PT LONG TERM GOAL #3   Title  Pt will complete TUG in less than 14 sec with LRAD to reflect improvements in his stability with ambulation.    Time  8    Period  Weeks    Status  New      PT LONG TERM GOAL #4   Title  Pt will be independent with his advanced HEP at home and the gym to allow for safe transition following discharge from PT.     Time  8    Period  Weeks    Status  New      PT LONG TERM GOAL #5   Title  Pt will report atleast 75% improvement in his strength and participation in daily activity from the start of PT to decrease caregiver support.     Time  8    Period  Weeks     Status  New      PT LONG TERM GOAL #6   Title  Pt will have increased stamina and endurance evident by his ability to ambulate atleast 1442f during 6 MWT.    Time  8    Period  Weeks    Status  New            Plan - 03/16/18 08413   Clinical Impression Statement  Pt continues to progress his reps and resistance during his sessions. He was able to complete single leg press machine with minimal difficulty, requiring therapist instruction on proper breathing sequencing. He notes that he has difficulty with getting up from the floor so focus was shifted to single leg strengthening this visit. He does appear to have some improvements in active Rt hip flexion, evident by decreased need for assistance during today's session. HEP updates were made to reflect his improvements in LE strength and he demonstrated understanding.     Rehab Potential  Good    PT Frequency  2x / week    PT Duration  8 weeks    PT Treatment/Interventions  ADLs/Self Care Home Management;Gait training;Balance training;Therapeutic exercise;Therapeutic activities;Functional mobility training;Stair training;Neuromuscular re-education;Patient/family education;Manual techniques;Passive range of motion    PT Next Visit Plan  TUG; hip flexion in supine if pt able and f/u on straight leg raise; focus on standing balance on unstable surfaces; hip flexibility     PT Home Exercise Plan  [email or web address]     Consulted and Agree with Plan of Care  Patient       Patient will benefit from skilled therapeutic intervention in order to improve the following deficits and impairments:  Decreased activity tolerance, Decreased strength, Impaired flexibility, Improper body mechanics, Increased edema, Decreased endurance, Decreased mobility  Visit Diagnosis: Muscle weakness (generalized)  Unsteadiness on feet     Problem List Patient Active Problem List   Diagnosis Date Noted  . Acute renal failure (HFrederic   . PAF (paroxysmal atrial  fibrillation) (HGreen [place]   . Pressure injury of skin 01/17/2018  . Intestinal metaplasia of gastric mucosa   . Iron deficiency anemia   . Anasarca   . Malnutrition of moderate degree 01/10/2018  . GI bleed 01/09/2018  . Acute respiratory failure with hypoxia (HAust[place] 11/12/2017  . Gastritis and gastroduodenitis   . Acute gastric ulcer without hemorrhage or perforation   . Duodenal ulcer   . Paraesophageal hernia   . Upper GI bleeding 11/09/2017  . CKD (chronic kidney disease), stage III (HMaren[place] 11/09/2017  . Normocytic anemia 11/09/2017  . Acute on chronic diastolic CHF (congestive heart failure) (HFort Bragg 11/09/2017  . Melena   . RBBB 07/12/2017  . 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 Malignant neoplasm of left lung (HLower [place] 02/08/2017  . Elevated PSA 11/29/2016  . Heart murmur 11/04/2015  . History of active rheumatic fever 11/04/2015  . Solitary pulmonary nodule 08/31/2013  . Renal malignant tumor (HPrinc[place] 08/31/2013  . Thyroid nodule 08/31/2013  . Hyperlipidemia 04/25/2013  . Essential hypertension, benign 06/08/2012    8:41 AM,03/16/18 SSherol DadePT, DPT CBowlerat [facility]arden [facility] [address] [place] [address]Phone: [phone]  Fax:  [phone]  Name: Andrew Guerra MRN: [id number] Date of Birth: 10-Jun-1940

## 2018-03-21 ENCOUNTER — Encounter: Payer: Self-pay | Admitting: Family Medicine

## 2018-03-21 ENCOUNTER — Other Ambulatory Visit: Payer: Medicare Other

## 2018-03-21 ENCOUNTER — Ambulatory Visit: Payer: Medicare Other | Admitting: Family Medicine

## 2018-03-21 ENCOUNTER — Encounter: Payer: Medicare Other | Admitting: Physical Therapy

## 2018-03-21 VITALS — BP 178/86 | Ht 70.5 in | Wt 199.0 lb

## 2018-03-21 DIAGNOSIS — D500 Iron deficiency anemia secondary to blood loss (chronic): Secondary | ICD-10-CM | POA: Diagnosis not present

## 2018-03-21 DIAGNOSIS — R0609 Other forms of dyspnea: Secondary | ICD-10-CM

## 2018-03-21 LAB — POCT HEMOGLOBIN: Hemoglobin: 7.8 g/dL — AB (ref 11–14.6)

## 2018-03-21 NOTE — Progress Notes (Signed)
   Subjective:    Patient ID: Andrew Guerra, male    DOB: [birth year], 78 y.o.   MRN: [id number]  HPI Patient is here today to follow up on his recent hospitalization.  Renal failure:Followed by Elmarie Shiley w  [facility]  Anemia related to GI bleed today in the office 7.8. He is scheduled for [facility] iron infusion # 1 is scheduled on March 9,2020 at [facility]Arranged by [facility]).  He does wear O2,but states he is not having any shortness of breath or fatigue out of the normal.  Blood pressure:He states his bp has been doing well.He takes Torsemide 120 per day.    Review of Systems  Constitutional: Negative for diaphoresis and fatigue.  HENT: Negative for congestion and rhinorrhea.   Respiratory: Negative for cough and shortness of breath.   Cardiovascular: Negative for chest pain and leg swelling.  Gastrointestinal: Negative for abdominal pain and diarrhea.  Skin: Negative for color change and rash.  Neurological: Negative for dizziness and headaches.  Psychiatric/Behavioral: Negative for behavioral problems and confusion.   Results for orders placed or performed in visit on 03/21/18  POCT hemoglobin  Result Value Ref Range   Hemoglobin 7.8 (A) 11 - 14.6 g/dL       Objective:   Physical Exam Vitals signs reviewed.  Constitutional:      General: He is not in acute distress. HENT:     Head: Normocephalic and atraumatic.  Eyes:     General:        Right eye: No discharge.        Left eye: No discharge.  Neck:     Trachea: No tracheal deviation.  Cardiovascular:     Rate and Rhythm: Normal rate and regular rhythm.     Heart sounds: Normal heart sounds. No murmur.  Pulmonary:     Effort: Pulmonary effort is normal. No respiratory distress.     Breath sounds: Normal breath sounds.  Lymphadenopathy:     Cervical: No cervical adenopathy.  Skin:    General: Skin is warm and dry.  Neurological:     Mental Status: He is alert.   Coordination: Coordination normal.  Psychiatric:        Behavior: Behavior normal.           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 he monitors this closely  Patient has unfortunately had a history of GI bleed he is very hesitant to be on Coumadin and does not want to start at this point  We did discuss how intermittent atrial fibrillation puts him at risk of stroke  Had atrial fibrillation while in the hospital but on today's exam no atrial fibrillation on more recent EKG at the end of January no atrial fibrillation therefore it is raises the question that does atrial fibrillation still exist paroxysmally nor was this just an episode that occurred while he was in the hospital  We will connect with cardiology to see if they can relook at the situation because of patient's reluctance to be on Coumadin because of his history of GI bleed plus also his persistently low hemoglobin because of his kidney functions  Patient gradually getting better.  Follow-up in 3 to 4 months  His EKG here today shows normal sinus rhythm

## 2018-03-21 NOTE — Telephone Encounter (Signed)
Andrew Guerra, Please get set up with EP for further guidance.  His afib was in setting of acute illness.  Could consider holding off on anticoagulation and getting loop recorder placed to assess for silent afib.

## 2018-03-22 NOTE — Telephone Encounter (Signed)
Spoke with the patient, he expressed understanding about his EP referral.

## 2018-03-22 NOTE — Addendum Note (Signed)
Addended by: Sarina Ill on: 03/22/2018 11:26 AM   Modules accepted: Orders

## 2018-03-23 ENCOUNTER — Encounter: Payer: Self-pay | Admitting: Physical Therapy

## 2018-03-23 ENCOUNTER — Ambulatory Visit: Payer: Medicare Other | Attending: Internal Medicine | Admitting: Physical Therapy

## 2018-03-23 DIAGNOSIS — M6281 Muscle weakness (generalized): Secondary | ICD-10-CM | POA: Insufficient documentation

## 2018-03-23 DIAGNOSIS — R2681 Unsteadiness on feet: Secondary | ICD-10-CM | POA: Diagnosis present

## 2018-03-23 NOTE — Therapy (Signed)
[facility] [address], [place], [place], [address] Phone: [phone]   Fax:  [phone]  Physical Therapy Treatment  Patient Details  Name: Andrew Guerra MRN: [id number] Date of Birth: 1940-03-26 Referring Provider (PT): Oren Binet, MD   Encounter Date: 03/23/2018  PT End of Session - 03/23/18 0756    Visit Number  8    Date for PT Re-Evaluation  04/17/18    Authorization Type  [employer] medicare     Authorization Time Period  02/16/18 to 04/17/18    Authorization - Visit Number  8    Authorization - Number of Visits  10    PT Start Time  9675    PT Stop Time  0835    PT Time Calculation (min)  38 min    Equipment Utilized During Treatment  Oxygen    Activity Tolerance  No increased pain;Patient tolerated treatment well    Behavior During Therapy  [facility] for tasks assessed/performed       Past Medical History:  Diagnosis Date  . 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  [name] Cataract    right eye - surgery to remove  . Elevated PSA 11/29/2016   [facility].  Most recent PSA near 11.  He has had atypia on biopsy.  November 2008  . Essential hypertension, benign 06/08/2012  . Full dentures   . Glaucoma   . Heart murmur    since childhood, never has caused any problems  . History of rheumatic fever 11/04/2015  . Hyperlipidemia 04/25/2013  . Hypertension   . Malignant neoplasm of left lung ([place]) 02/08/2017  . RBBB 07/12/2017  . Sickle cell trait ([place])    no problems per patient    Past Surgical History:  Procedure Laterality Date  . BIOPSY  11/11/2017   Procedure: BIOPSY;  Surgeon: Lavena Bullion, DO;  Location: [place] [facility];  Service: Gastroenterology;;  . BIOPSY  01/10/2018   Procedure: BIOPSY;  Surgeon: [name], MD;  Location: [place];  Service:  Gastroenterology;;  . cataract eye surgery Right   . COLONOSCOPY  11/2009   hx polyps/[place]  . ESOPHAGOGASTRODUODENOSCOPY N/A 11/11/2017   Procedure: ESOPHAGOGASTRODUODENOSCOPY (EGD);  Surgeon: Lavena Bullion, DO;  Location: [facility];  Service: Gastroenterology;  Laterality: N/A;  . ESOPHAGOGASTRODUODENOSCOPY (EGD) WITH PROPOFOL N/A 01/10/2018   Procedure: ESOPHAGOGASTRODUODENOSCOPY (EGD) WITH PROPOFOL;  Surgeon: [name], MD;  Location: [place];  Service: Gastroenterology;  Laterality: N/A;  . IR FLUORO GUIDE CV LINE RIGHT  01/16/2018  . IR FLUORO GUIDE CV LINE RIGHT  01/31/2018  . IR US GUIDE VASC ACCESS RIGHT  01/16/2018  . IR US GUIDE VASC ACCESS RIGHT  01/31/2018  . PROSTATE BIOPSY    . right eye surgery     to lower eye pressure  . RIGHT HEART CATH N/A 01/19/2018   Procedure: RIGHT HEART CATH;  Surgeon: Nelva Bush, MD;  Location: [place] [facility];  Service: Cardiovascular;  Laterality: N/A;  . TONSILLECTOMY    . VIDEO ASSISTED THORACOSCOPY (VATS)/ LOBECTOMY Right 12/26/2015  . wisdom tteeth ext      There were no vitals filed for this visit.  Subjective Assessment - 03/23/18 0756    Subjective  Pt states he feels about 30% stronger since starting PT.  HEP going well.      Pertinent  History  recent hospitalization due to kidney failure, heart failure    Limitations  House hold activities;Walking    Patient Stated Goals  improve his stamina and strength to allow him to get in and out of the car with improved ease     Currently in Pain?  No/denies         [facility] PT Assessment - 03/23/18 0001      Timed Up and Go Test   TUG  Normal TUG    Normal TUG (seconds)  12    TUG Comments  without rollator, carrying oxygen sling over shoulder                   [facility] Adult PT Treatment/Exercise - 03/23/18 0001      Exercises   Exercises  Knee/Hip;Lumbar      Knee/Hip Exercises: Aerobic   Nustep  L1 x 5' seat 10   PT present to monitor  fatigue/discuss progress     Knee/Hip Exercises: Machines for Strengthening   Total Gym Leg Press  seat 8 single leg #60 3x10 reps each LE       Knee/Hip Exercises: Standing   Heel Raises  Both;1 set;20 reps    Heel Raises Limitations  2 finger support    Knee Flexion  Strengthening;Both;2 sets;10 reps    Knee Flexion Limitations  2#    Hip Flexion Limitations  marching 2# toe tap edge of TM bil UE support 2x20    Hip Abduction  Stengthening;Both;1 set;10 reps;Knee straight    Abduction Limitations  red TB around ankles    Hip Extension  Stengthening;Both;Knee straight;1 set;10 reps    Extension Limitations  red TB around ankles      Knee/Hip Exercises: Seated   Sit to Sand  3 sets;5 reps   from black foam pad              PT Short Term Goals - 03/07/18 0844      PT SHORT TERM GOAL #1   Title  Pt will be independent with his initial HEP to improve LE strength and stamina.     Time  4    Period  Weeks    Status  Achieved        PT Long Term Goals - 03/23/18 1062      PT LONG TERM GOAL #3   Title  Pt will complete TUG in less than 14 sec with LRAD to reflect improvements in his stability with ambulation.    Status  Achieved   12 sec without rollator            Plan - 03/23/18 0831    Clinical Impression Statement  Pt with good tolerance of ther ex today.  TUG was performed in 12 sec with Pt carrying shoulder sling oxygen tank (no rollator).  PT cued Pt a few times to take breaths in through nose to ensure oxygenation (on 2% oxygen).  Pt needed occasional short breaks in between sets or exercises due to fatigue and need to take in oxygen.  He demonstrated good control in conc and eccentric phases of single leg press on leg press today and was pleased to be able to add a third set today.  He will continue to benefit from skilled PT to progress strength, endurance and functional training as he recovers from length hospitalization.    PT Frequency  2x / week    PT  Duration  8 weeks  PT Treatment/Interventions  ADLs/Self Care Home Management;Gait training;Balance training;Therapeutic exercise;Therapeutic activities;Functional mobility training;Stair training;Neuromuscular re-education;Patient/family education;Manual techniques;Passive range of motion    PT Next Visit Plan   hip flexion in supine if pt able and f/u on straight leg raise; focus on standing balance on unstable surfaces; hip flexibility     PT Home Exercise Plan  [id number]     Consulted and Agree with Plan of Care  Patient       Patient will benefit from skilled therapeutic intervention in order to improve the following deficits and impairments:  Decreased activity tolerance, Decreased strength, Impaired flexibility, Improper body mechanics, Increased edema, Decreased endurance, Decreased mobility  Visit Diagnosis: Muscle weakness (generalized)  Unsteadiness on feet     Problem List Patient Active Problem List   Diagnosis Date Noted  . Acute renal failure ([place])   . PAF (paroxysmal atrial fibrillation) ([place])   . Pressure injury of skin 01/17/2018  . Intestinal metaplasia of gastric mucosa   . Iron deficiency anemia   . Anasarca   . Malnutrition of moderate degree 01/10/2018  . GI bleed 01/09/2018  . Acute respiratory failure with hypoxia ([place]) 11/12/2017  . Gastritis and gastroduodenitis   . Acute gastric ulcer without hemorrhage or perforation   . Duodenal ulcer   . Paraesophageal hernia   . Upper GI bleeding 11/09/2017  . CKD (chronic kidney disease), stage III ([place]) 11/09/2017  . Normocytic anemia 11/09/2017  . Acute on chronic diastolic CHF (congestive heart failure) ([place]) 11/09/2017  . Melena   . RBBB 07/12/2017  . 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 Malignant neoplasm of left lung ([place]) 02/08/2017  . Elevated PSA 11/29/2016  . Heart murmur 11/04/2015  . History of active rheumatic fever 11/04/2015  . Solitary pulmonary nodule 08/31/2013  .  Renal malignant tumor ([place]) 08/31/2013  . Thyroid nodule 08/31/2013  . Hyperlipidemia 04/25/2013  . Essential hypertension, benign 06/08/2012    Baruch Merl, PT 03/23/18 8:38 AM   [facility] [address] [place], [place], [address] Phone: [phone]   Fax:  [phone]  Name: RAVIN DENARDO MRN: [id number] Date of Birth: 1940/09/09

## 2018-03-24 ENCOUNTER — Other Ambulatory Visit (HOSPITAL_COMMUNITY): Payer: Self-pay | Admitting: *Deleted

## 2018-03-27 ENCOUNTER — Ambulatory Visit (HOSPITAL_COMMUNITY)
Admission: RE | Admit: 2018-03-27 | Discharge: 2018-03-27 | Disposition: A | Discharge status: Home / Self Care | Payer: Medicare Other | Source: Ambulatory Visit | Attending: Nephrology | Admitting: Nephrology

## 2018-03-27 ENCOUNTER — Other Ambulatory Visit: Payer: Self-pay

## 2018-03-27 ENCOUNTER — Encounter: Payer: Self-pay | Admitting: *Deleted

## 2018-03-27 DIAGNOSIS — D631 Anemia in chronic kidney disease: Secondary | ICD-10-CM | POA: Diagnosis not present

## 2018-03-27 DIAGNOSIS — N184 Chronic kidney disease, stage 4 (severe): Secondary | ICD-10-CM | POA: Diagnosis not present

## 2018-03-27 LAB — POCT HEMOGLOBIN-HEMACUE: HEMOGLOBIN: 7.3 g/dL — AB (ref 13.0–17.0)

## 2018-03-27 MED ORDER — EPOETIN ALFA-EPBX 10000 UNIT/ML IJ SOLN
10000.0000 [IU] | Freq: Once | INTRAMUSCULAR | Status: AC
  Administered 2018-03-27: 10000 [IU] via SUBCUTANEOUS
  Filled 2018-03-27: qty 1

## 2018-03-27 MED ORDER — SODIUM CHLORIDE 0.9 % IV SOLN
510.0000 mg | INTRAVENOUS | Status: DC
  Administered 2018-03-27: 510 mg via INTRAVENOUS
  Filled 2018-03-27: qty 510

## 2018-03-27 NOTE — Progress Notes (Signed)
Pt Hemocue is 7.3. Dr Serita Grit office made aware and was instructed to continue with current orders per Dr Clover Mealy at [facility].

## 2018-03-27 NOTE — Discharge Instructions (Signed)

## 2018-03-28 ENCOUNTER — Ambulatory Visit: Payer: Medicare Other | Admitting: Physical Therapy

## 2018-03-28 ENCOUNTER — Encounter: Payer: Self-pay | Admitting: Cardiology

## 2018-03-28 ENCOUNTER — Encounter: Payer: Self-pay | Admitting: Family Medicine

## 2018-03-28 ENCOUNTER — Ambulatory Visit: Payer: Medicare Other | Admitting: Cardiology

## 2018-03-28 VITALS — BP 126/72 | HR 84 | Ht 70.0 in | Wt 198.0 lb

## 2018-03-28 DIAGNOSIS — R2681 Unsteadiness on feet: Secondary | ICD-10-CM

## 2018-03-28 DIAGNOSIS — I480 Paroxysmal atrial fibrillation: Secondary | ICD-10-CM | POA: Diagnosis not present

## 2018-03-28 DIAGNOSIS — M6281 Muscle weakness (generalized): Secondary | ICD-10-CM | POA: Diagnosis not present

## 2018-03-28 NOTE — Therapy (Signed)
[facility] [address], [place], [place], [address] Phone: [phone]   Fax:  [phone]  Physical Therapy Treatment  Patient Details  Name: Andrew Guerra MRN: [id number] Date of Birth: 1940-03-19 Referring Provider (PT): Oren Binet, MD   Encounter Date: 03/28/2018  PT End of Session - 03/28/18 0840    Visit Number  9    Date for PT Re-Evaluation  04/17/18    Authorization Type  [employer] medicare     Authorization Time Period  02/16/18 to 04/17/18    Authorization - Visit Number  9    Authorization - Number of Visits  10    PT Start Time  0802    PT Stop Time  0841    PT Time Calculation (min)  39 min    Equipment Utilized During Treatment  Oxygen    Activity Tolerance  No increased pain;Patient tolerated treatment well    Behavior During Therapy  [facility] for tasks assessed/performed       Past Medical History:  Diagnosis Date  . 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  [name] Cataract    right eye - surgery to remove  . Elevated PSA 11/29/2016   [facility].  Most recent PSA near 11.  He has had atypia on biopsy.  November 2008  . Essential hypertension, benign 06/08/2012  . Full dentures   . Glaucoma   . Heart murmur    since childhood, never has caused any problems  . History of rheumatic fever 11/04/2015  . Hyperlipidemia 04/25/2013  . Hypertension   . Malignant neoplasm of left lung ([place]) 02/08/2017  . RBBB 07/12/2017  . Sickle cell trait ([place])    no problems per patient    Past Surgical History:  Procedure Laterality Date  . BIOPSY  11/11/2017   Procedure: BIOPSY;  Surgeon: Lavena Bullion, DO;  Location: [place] [facility];  Service: Gastroenterology;;  . BIOPSY  01/10/2018   Procedure: BIOPSY;  Surgeon: [name], MD;  Location: [place];   Service: Gastroenterology;;  . cataract eye surgery Right   . COLONOSCOPY  11/2009   hx polyps/[place]  . ESOPHAGOGASTRODUODENOSCOPY N/A 11/11/2017   Procedure: ESOPHAGOGASTRODUODENOSCOPY (EGD);  Surgeon: Lavena Bullion, DO;  [facility];  Service: Gastroenterology;  Laterality: N/A;  . ESOPHAGOGASTRODUODENOSCOPY (EGD) WITH PROPOFOL N/A 01/10/2018   Procedure: ESOPHAGOGASTRODUODENOSCOPY (EGD) WITH PROPOFOL;  Surgeon: [name], MD;  Location: [place];  Service: Gastroenterology;  Laterality: N/A;  . IR FLUORO GUIDE CV LINE RIGHT  01/16/2018  . IR FLUORO GUIDE CV LINE RIGHT  01/31/2018  . IR US GUIDE VASC ACCESS RIGHT  01/16/2018  . IR US GUIDE VASC ACCESS RIGHT  01/31/2018  . PROSTATE BIOPSY    . right eye surgery     to lower eye pressure  . RIGHT HEART CATH N/A 01/19/2018   Procedure: RIGHT HEART CATH;  Surgeon: Nelva Bush, MD;  Location: [place] [facility];  Service: Cardiovascular;  Laterality: N/A;  . TONSILLECTOMY    . VIDEO ASSISTED THORACOSCOPY (VATS)/ LOBECTOMY Right 12/26/2015  . wisdom tteeth ext      There were no vitals filed for this visit.  Subjective Assessment - 03/28/18 0926    Subjective  Pt states things are going well.  He was able to drive.    Pertinent History  recent hospitalization  due to kidney failure, heart failure    Limitations  House hold activities;Walking    Patient Stated Goals  improve his stamina and strength to allow him to get in and out of the car with improved ease     Currently in Pain?  No/denies                       [facility] Adult PT Treatment/Exercise - 03/28/18 0001      Knee/Hip Exercises: Machines for Strengthening   Total Gym Leg Press  Seat 8 single leg 2x10 #70      Knee/Hip Exercises: Standing   Heel Raises  1 set;Left;Right;10 reps    Heel Raises Limitations  single leg     Other Standing Knee Exercises  3-way hip slider x10 reps with yellow TB around ankles intermittent rest break  needed    Other Standing Knee Exercises  BUE pressdown with red TB with hip flexion hold x2 set, 2x5 reps       Knee/Hip Exercises: Seated   Sit to Sand  2 sets;5 reps;with UE support   pt encouraged not to sit in chair            PT Education - 03/28/18 0840    Education Details  updated HEP    Person(s) Educated  Patient    Methods  Explanation;Verbal cues;Handout    Comprehension  Verbalized understanding       PT Short Term Goals - 03/07/18 0844      PT SHORT TERM GOAL #1   Title  Pt will be independent with his initial HEP to improve LE strength and stamina.     Time  4    Period  Weeks    Status  Achieved        PT Long Term Goals - 03/23/18 3267      PT LONG TERM GOAL #3   Title  Pt will complete TUG in less than 14 sec with LRAD to reflect improvements in his stability with ambulation.    Status  Achieved   12 sec without rollator            Plan - 03/28/18 0841    Clinical Impression Statement  Continued this visit with therex progressions for improved standing endurance. Pt was able to increase resistance with leg press without any noted difficulty. Also completed several other new standing exercises. Pt required intermittent rest breaks during standing 3 way hip slider but denied any shortness of breath. HEP updates were made to reflect increases in strength and endurance. He had good understanding of this.     PT Frequency  2x / week    PT Duration  8 weeks    PT Treatment/Interventions  ADLs/Self Care Home Management;Gait training;Balance training;Therapeutic exercise;Therapeutic activities;Functional mobility training;Stair training;Neuromuscular re-education;Patient/family education;Manual techniques;Passive range of motion    PT Next Visit Plan  progress note; progress standing strengthening; hip flexion    PT Home Exercise Plan  [id number]     Consulted and Agree with Plan of Care  Patient       Patient will benefit from skilled therapeutic  intervention in order to improve the following deficits and impairments:  Decreased activity tolerance, Decreased strength, Impaired flexibility, Improper body mechanics, Increased edema, Decreased endurance, Decreased mobility  Visit Diagnosis: Muscle weakness (generalized)  Unsteadiness on feet     Problem List Patient Active Problem List   Diagnosis Date Noted  . Acute renal failure ([place])   .  PAF (paroxysmal atrial fibrillation) ([place])   . Pressure injury of skin 01/17/2018  . Intestinal metaplasia of gastric mucosa   . Iron deficiency anemia   . Anasarca   . Malnutrition of moderate degree 01/10/2018  . GI bleed 01/09/2018  . Acute respiratory failure with hypoxia ([place]) 11/12/2017  . Gastritis and gastroduodenitis   . Acute gastric ulcer without hemorrhage or perforation   . Duodenal ulcer   . Paraesophageal hernia   . Upper GI bleeding 11/09/2017  . CKD (chronic kidney disease), stage III ([place]) 11/09/2017  . Normocytic anemia 11/09/2017  . Acute on chronic diastolic CHF (congestive heart failure) ([place]) 11/09/2017  . Melena   . RBBB 07/12/2017  . Pulmonary nodule 02/17/2017  . 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 Malignant neoplasm of left lung ([place]) 02/08/2017  . Elevated PSA 11/29/2016  . Aortic valve stenosis, moderate 12/26/2015  . BPH (benign prostatic hyperplasia) 12/18/2015  . Hypertension 12/18/2015  . 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 Heart murmur 11/04/2015  . History of active rheumatic fever 11/04/2015  . Solitary pulmonary nodule 08/31/2013  . 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 Thyroid nodule 08/31/2013  . Hyperlipidemia 04/25/2013  . Essential hypertension, benign 06/08/2012    9:29 AM,03/28/18 Sherol Dade PT, DPT [facility] [address], [place], [place], [address] Phone: [phone]   Fax:  [phone]  Name: Andrew Guerra MRN: [id number] Date of Birth: [birth year]

## 2018-03-28 NOTE — Progress Notes (Addendum)
Electrophysiology Office Note   Date:  03/28/2018   ID:  Andrew Guerra, DOB July 20, 1940, MRN [id number]  PCP:  [name], MD  Cardiologist:  Radford Pax Primary Electrophysiologist:  Josilynn Losh Meredith Leeds, MD    No chief complaint on file.    History of Present Illness: Andrew Guerra is a 78 y.o. male who is being seen today for the evaluation of atrial fibrillation at the request of Turner, Eber Hong, MD. Presenting today for electrophysiology evaluation.  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  He was admitted to the hospital 12 23-1 23 for acute GI bleed.  He underwent EGD which showed gastric and duodenal ulcers.  He had a prolonged hospital course complicated by ATN requiring dialysis, pneumonia, UTI, and delirium.  Course was also complicated by atrial fibrillation with rapid rates.  He had severe RV dilation and moderate RV dysfunction.  Right heart cath showed normal left heart filling pressures but mildly elevated right heart pressures.    Today, he denies symptoms of palpitations, chest pain, shortness of breath, orthopnea, PND, lower extremity edema, claudication, dizziness, presyncope, syncope, bleeding, or neurologic sequela. The patient is tolerating medications without difficulties.  Since his discharge from the hospital, he is felt well.  His dialysis catheter was removed and he was told that he would not need dialysis in the future.  He continues to be on nasal cannula oxygen, but has no major restrictions at this time.   Past Medical History:  Diagnosis Date  . 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  [name] Cataract    right eye - surgery to remove  . Elevated PSA 11/29/2016   [facility].  Most recent PSA near 11.  He has had atypia on biopsy.  November 2008  . Essential hypertension, benign 06/08/2012  . Full dentures   . Glaucoma   . Heart murmur    since childhood, never has caused any problems  . History of rheumatic fever 11/04/2015  . Hyperlipidemia 04/25/2013  . Hypertension   . Malignant neoplasm of left lung ([place]) 02/08/2017  . RBBB 07/12/2017  . Sickle cell trait ([place])    no problems per patient   Past Surgical History:  Procedure Laterality Date  . BIOPSY  11/11/2017   Procedure: BIOPSY;  Surgeon: Lavena Bullion, DO;  Location: [facility];  Service: Gastroenterology;;  . BIOPSY  01/10/2018   Procedure: BIOPSY;  Surgeon: [name], MD;  Location: [facility];  Service: Gastroenterology;;  . cataract eye surgery Right   . COLONOSCOPY  11/2009   hx polyps/[place]  . ESOPHAGOGASTRODUODENOSCOPY N/A 11/11/2017   Procedure: ESOPHAGOGASTRODUODENOSCOPY (EGD);  Surgeon: Lavena Bullion, DO;  Location[facility];  Service: Gastroenterology;  Laterality: N/A;  . ESOPHAGOGASTRODUODENOSCOPY (EGD) WITH PROPOFOL N/A 01/10/2018   Procedure: ESOPHAGOGASTRODUODENOSCOPY (EGD) WITH PROPOFOL;  Surgeon: [name], MD;  Location: [place];  Service: Gastroenterology;  Laterality: N/A;  . IR FLUORO GUIDE CV LINE RIGHT  01/16/2018  . IR FLUORO GUIDE CV LINE RIGHT  01/31/2018  . IR US GUIDE VASC ACCESS RIGHT  01/16/2018  . IR US GUIDE VASC ACCESS RIGHT  01/31/2018  . PROSTATE BIOPSY    . right eye surgery     to lower  eye pressure  . RIGHT HEART CATH N/A 01/19/2018   Procedure: RIGHT HEART CATH;  Surgeon: Nelva Bush, MD;  Location: [place] CV LAB;  Service: Cardiovascular;  Laterality: N/A;  . TONSILLECTOMY    . VIDEO ASSISTED THORACOSCOPY (VATS)/ LOBECTOMY Right 12/26/2015  . wisdom tteeth ext       Current Outpatient Medications  Medication Sig Dispense Refill  . Brinzolamide-Brimonidine (SIMBRINZA) 1-0.2 % SUSP Place 2-3  drops into both eyes 3 (three) times daily.     . budesonide-formoterol (SYMBICORT) 80-4.5 MCG/ACT inhaler Inhale 2 puffs into the lungs 2 (two) times daily. 1 Inhaler 3  . Multiple Vitamin (MULTIVITAMIN) tablet Take 1 tablet by mouth daily.    . Netarsudil-Latanoprost (ROCKLATAN) 0.02-0.005 % SOLN Place 1 drop into both eyes at bedtime.     . NONFORMULARY OR COMPOUNDED ITEM Outpatient PT-please treat and evaluate  Diagnosis: Deconditioning/debility due to prolonged hospitalization, kidney failure, heart failure 1 each 0  . Nutritional Supplements (FEEDING SUPPLEMENT, NEPRO CARB STEADY,) LIQD Take 237 mLs by mouth 3 (three) times daily between meals. 90 Can 0  . pantoprazole (PROTONIX) 40 MG tablet Take 1 tablet (40 mg total) by mouth 2 (two) times daily. 60 tablet 1  . potassium chloride SA (K-DUR,KLOR-CON) 20 MEQ tablet Take 2 tablets (40 mEq total) by mouth daily. 60 tablet 0  . torsemide (DEMADEX) 20 MG tablet Take 60 mg by mouth 2 (two) times daily.    [name] XIIDRA 5 % SOLN as directed.     No current facility-administered medications for this visit.     Allergies:   Ambien [zolpidem tartrate] and Atorvastatin   Social History:  The patient  reports that he quit smoking about 7 years ago. His smoking use included cigarettes. He smoked 1.00 pack per day. He has never used smokeless tobacco. He reports current alcohol use of about 1.0 standard drinks of alcohol per week. He reports that he does not use drugs.   Family History:  The patient's family history includes Cerebral aneurysm in his mother; Diabetes in his mother and sister; Lung cancer in his father.    ROS:  Please see the history of present illness.   Otherwise, review of systems is positive for none.   All other systems are reviewed and negative.    PHYSICAL EXAM: VS:  BP 126/72   Pulse 84   Ht 5\' 10"  (1.778 m)   Wt 198 lb (89.8 kg)   SpO2 91%   BMI 28.41 kg/m  , BMI Body mass index is 28.41 kg/m. GEN: Well nourished,  well developed, in no acute distress  HEENT: normal  Neck: no JVD, carotid bruits, or masses Cardiac: RRR; no murmurs, rubs, or gallops,no edema  Respiratory:  clear to auscultation bilaterally, normal work of breathing GI: soft, nontender, nondistended, + BS MS: no deformity or atrophy  Skin: warm and dry Neuro:  Strength and sensation are intact Psych: euthymic mood, full affect  EKG:  EKG is ordered today. Personal review of the ekg ordered shows SR, RBBB, LAFB  Recent Labs: 01/09/2018: B Natriuretic Peptide 598.0 01/21/2018: ALT 30 02/08/2018: Magnesium 1.6 02/13/2018: BUN 86; Creatinine, Ser 3.52; Platelets 326; Potassium 3.6; Sodium 141 03/27/2018: Hemoglobin 7.3    Lipid Panel     Component Value Date/Time   CHOL 177 09/05/2017 0918   TRIG 69 09/05/2017 0918   HDL 49 09/05/2017 0918   CHOLHDL 3.6 09/05/2017 0918   CHOLHDL 3.7 04/10/2013 0808   VLDL 16 04/10/2013 9417  LDLCALC 114 (H) 09/05/2017 0918     Wt Readings from Last 3 Encounters:  03/28/18 198 lb (89.8 kg)  03/27/18 194 lb (88 kg)  03/21/18 199 lb (90.3 kg)      Other studies Reviewed: Additional studies/ records that were reviewed today include: TTE 01/13/18  Review of the above records today demonstrates:  - Left ventricle: The cavity size was normal. Systolic function was   normal. The estimated ejection fraction was in the range of 60%   to 65%. Wall motion was normal; there were no regional wall   motion abnormalities. Doppler parameters are consistent with   abnormal left ventricular relaxation (grade 1 diastolic   dysfunction). Stroke volume/bsa: 48 ml/m^2. - Ventricular septum: Leftward ventricular septal shift in systole   with D-shaped septum suggests RV pressure overload. - Aortic valve: Mildly thickened, moderately calcified leaflets.   Valve mobility was moderately restricted. There was mild to   moderate stenosis. There was trivial regurgitation. Mean gradient   (S): 22 mm Hg. Valve  previously described as bicuspid, unable to   define valve leaflets on today&'s exam due to calcification and   reduced mobility. - Mitral valve: There was trivial regurgitation. - Left atrium: The atrium was normal in size. - Right ventricle: The cavity size was moderately to severely   dilated. Wall thickness was normal. Systolic function was   moderately reduced. RV systolic pressure (S, est): 52 mm Hg. - Right atrium: The atrium was mildly dilated. - Tricuspid valve: There was mild regurgitation. - Pulmonary arteries: Systolic pressure was increased. PA peak   pressure: 52 mm Hg (S). - Inferior vena cava: The vessel was dilated. The respirophasic   diameter changes were blunted (< 50%), consistent with elevated   central venous pressure. - Pericardium, extracardiac: A small pericardial effusion was   identified primarly at the apex, with trivial circumferential   extension.   ASSESSMENT AND PLAN:  1.  Paroxysmal atrial fibrillation: Currently not anticoagulated due to recent GI bleed.  Currently on amiodarone.  At this point it certainly appears that his atrial fibrillation could have been reactive from his prior hospitalization.  I Murriel Eidem thus plan to stop his amiodarone.  We Kyna Blahnik get a 14-day monitor in 1 month once amiodarone has washed out of his system.  This patients CHA2DS2-VASc Score and unadjusted Ischemic Stroke Rate (% per year) is equal to 3.2 % stroke rate/year from a score of 3  Above score calculated as 1 point each if present [CHF, HTN, DM, Vascular=MI/PAD/Aortic Plaque, Age if 65-74, or Male] Above score calculated as 2 points each if present [Age > 75, or Stroke/TIA/TE]   2.  Chronic diastolic heart failure: No obvious signs of volume overload  3.  Moderate aortic stenosis with bicuspid aortic valve: Following per primary cardiology with yearly echo.    Current medicines are reviewed at length with the patient today.   The patient does not have concerns  regarding his medicines.  The following changes were made today:  none  Labs/ tests ordered today include:  Orders Placed This Encounter  Procedures  . LONG TERM MONITOR (3-14 DAYS)  . EKG 12-Lead   Case discussed with primary cardiology  Disposition:   FU with Beverlyann Broxterman as needed  Signed, Iva Montelongo Meredith Leeds, MD  03/28/2018 12:48 PM     [facility] [address] [place] [address] [phone] (office) [phone] (fax)

## 2018-03-28 NOTE — Patient Instructions (Signed)
Medication Instructions:  Your physician has recommended you make the following change in your medication:  1. STOP Amiodarone  * If you need a refill on your cardiac medications before your next appointment, please call your pharmacy.   Labwork: None ordered  Testing/Procedures: Your physician has recommended that you wear a 2 week event monitor in 1 month. Event monitors are medical devices that record the heart's electrical activity. Doctors most often [name] these monitors to diagnose arrhythmias. Arrhythmias are problems with the speed or rhythm of the heartbeat. The monitor is a small, portable device. You can wear one while you do your normal daily activities. This is usually used to diagnose what is causing palpitations/syncope (passing out).  Follow-Up: Follow up to be determined once monitor is completed and reviewed by the physician.  Thank you for choosing [facility]!!   Trinidad Curet, RN [phone]  Any Other Special Instructions Will Be Listed Below (If Applicable).   Ambulatory Cardiac Monitoring An ambulatory cardiac monitor is a small recording device that is used to detect abnormal heart rhythms (arrhythmias). Most monitors are connected by wires to flat, sticky disks (electrodes) that are then attached to your chest. You may need to wear a monitor if you have had symptoms such as:  Fast heartbeats (palpitations).  Dizziness.  Fainting or light-headedness.  Unexplained weakness.  Shortness of breath. There are several types of monitors. Some common monitors include:  Holter monitor. This records your heart rhythm continuously, usually for 24-48 hours.  Event (episodic) monitor. This monitor has a symptoms button, and when pushed, it will begin recording. You need to activate this monitor to record when you have a heart-related symptom.  Automatic detection monitor. This monitor will begin recording when it detects an abnormal heartbeat. What are the  risks? Generally, these devices are safe to use. However, it is possible that the skin under the electrodes will become irritated. How to prepare for monitoring Your health care provider will prepare your chest for the electrode placement and show you how to use the monitor.  Do not apply lotions to your chest before monitoring.  Follow directions on how to care for the monitor, and how to return the monitor when the testing period is complete. How to use your cardiac monitor  Follow directions about how long to wear the monitor, and if you can take the monitor off in order to shower or bathe. ? Do not let the monitor get wet. ? Do not bathe, swim, or use a hot tub while wearing the monitor.  Keep your skin clean. Do not put body lotion or moisturizer on your chest.  Change the electrodes as told by your health care provider, or any time they stop sticking to your skin. You may need to use medical tape to keep them on.  Try to put the electrodes in slightly different places on your chest to help prevent skin irritation. Follow directions from your health care provider about where to place the electrodes.  Make sure the monitor is safely clipped to your clothing or in a location close to your body as recommended by your health care provider.  If your monitor has a symptoms button, press the button to mark an event as soon as you feel a heart-related symptom, such as: ? Dizziness. ? Weakness. ? Light-headedness. ? Palpitations. ? Thumping or pounding in your chest. ? Shortness of breath. ? Unexplained weakness.  Keep a diary of your activities, such as walking, doing chores, and  taking medicine. It is very important to note what you were doing when you pushed the button to record your symptoms. This will help your health care provider determine what might be contributing to your symptoms.  Send the recorded information as recommended by your health care provider. It may take some time  for your health care provider to process the results.  Change the batteries as told by your health care provider.  Keep electronic devices away from your monitor. These include: ? Tablets. ? MP3 players. ? Cell phones.  While wearing your monitor you should avoid: ? Electric blankets. ? Armed forces operational officer. ? Electric toothbrushes. ? Microwave ovens. ? Magnets. ? Metal detectors. Get help right away if:  You have chest pain.  You have shortness of breath or extreme difficulty breathing.  You develop a very fast heartbeat that does not get better.  You develop dizziness that does not go away.  You faint or constantly feel like you are about to faint. Summary  An ambulatory cardiac monitor is a small recording device that is used to detect abnormal heart rhythms (arrhythmias).  Make sure you understand how to send the information from the monitor to your health care provider.  It is important to press the button on the monitor when you have any heart-related symptoms.  Keep a diary of your activities, such as walking, doing chores, and taking medicine. It is very important to note what you were doing when you pushed the button to record your symptoms. This will help your health care provider learn what might be causing your symptoms. This information is not intended to replace advice given to you by your health care provider. Make sure you discuss any questions you have with your health care provider. Document Released: 10/14/2007 Document Revised: 10/20/2016 Document Reviewed: 12/20/2015 Elsevier Interactive Patient Education  2019 [employer].

## 2018-03-28 NOTE — Patient Instructions (Signed)
Access Code: [email or web address]  URL: [email or web address]  Date: 03/28/2018  Prepared by: Sherol Dade   Exercises  Standing Bicep Curls Supinated with Dumbbells - 10 reps - 2 sets - 1x daily - 7x weekly  Tandem Stance with Support - 10 reps - 3 sets - 1x daily - 7x weekly  Hooklying Isometric Hip Flexion - 10 reps - 3 sets - 1x daily - 7x weekly  Standing Row with Anchored Resistance - 10-15 reps - 2 sets - 1x daily - 7x weekly  Supine Active Straight Leg Raise - 5 reps - 2-3 sets - 1x daily - 7x weekly  Hip Extension with Resistance Loop - 10 reps - 2 sets - 1x daily - 7x weekly  Single Leg Heel Raise - 10 reps - 1 sets - 2x daily - 7x weekly  Side Stepping with Resistance at Feet - 10 reps - 2 sets - 1x daily - 7x weekly    [facility] [address] [place], [place] [address] Phone # [phone] Fax [phone]

## 2018-03-30 ENCOUNTER — Ambulatory Visit: Payer: Medicare Other | Admitting: Physical Therapy

## 2018-03-30 ENCOUNTER — Other Ambulatory Visit: Payer: Self-pay

## 2018-03-30 ENCOUNTER — Encounter: Payer: Self-pay | Admitting: Physical Therapy

## 2018-03-30 DIAGNOSIS — R2681 Unsteadiness on feet: Secondary | ICD-10-CM

## 2018-03-30 DIAGNOSIS — M6281 Muscle weakness (generalized): Secondary | ICD-10-CM

## 2018-03-30 NOTE — Therapy (Addendum)
[facility] [address] [place], [place], [address] Phone: [phone]   Fax:  [phone]  Physical Therapy Treatment/Discharge  Patient Details  Name: Andrew Guerra MRN: [id number] Date of Birth: 04-14-1940 Referring Provider (PT): Oren Binet, MD  Progress Note Reporting Period 02/16/18 to 03/30/18  See note below for Objective Data and Assessment of Progress/Goals.      Encounter Date: 03/30/2018  PT End of Session - 03/30/18 1535    Visit Number  10    Date for PT Re-Evaluation  04/17/18    Authorization Type  [employer] medicare     Authorization Time Period  02/16/18 to 04/17/18    Authorization - Visit Number  10    Authorization - Number of Visits  20    PT Start Time  0803    PT Stop Time  0842    PT Time Calculation (min)  39 min    Equipment Utilized During Treatment  Oxygen    Activity Tolerance  No increased pain;Patient tolerated treatment well    Behavior During Therapy  [facility] for tasks assessed/performed       Past Medical History:  Diagnosis Date  . 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  .Marland KitchenCataract    right eye - surgery to remove  . Elevated PSA 11/29/2016   [facility].  Most recent PSA near 11.  He has had atypia on biopsy.  November 2008  . Essential hypertension, benign 06/08/2012  . Full dentures   . Glaucoma   . Heart murmur    since childhood, never has caused any problems  . History of rheumatic fever 11/04/2015  . Hyperlipidemia 04/25/2013  . Hypertension   . Malignant neoplasm of left lung ([place] 02/08/2017  . RBBB 07/12/2017  . Sickle cell trait ([place]    no problems per patient    Past Surgical History:  Procedure Laterality Date  . BIOPSY  11/11/2017   Procedure: BIOPSY;  Surgeon: CLavena Bullion DO;  [place] [facility];  Service: Gastroenterology;;  . BIOPSY  01/10/2018   Procedure: BIOPSY;  Surgeon: BThornton Park MD;  [place] [facility]  Service: Gastroenterology;;  . cataract eye surgery Right   . COLONOSCOPY  11/2009   hx polyps/[place]  . ESOPHAGOGASTRODUODENOSCOPY N/A 11/11/2017   Procedure: ESOPHAGOGASTRODUODENOSCOPY (EGD);  Surgeon: CLavena Bullion DO;  [facility];  Service: Gastroenterology;  Laterality: N/A;  . ESOPHAGOGASTRODUODENOSCOPY (EGD) WITH PROPOFOL N/A 01/10/2018   Procedure: ESOPHAGOGASTRODUODENOSCOPY (EGD) WITH PROPOFOL;  Surgeon: BThornton Park MD;  [place]  Service: Gastroenterology;  Laterality: N/A;  . IR FLUORO GUIDE CV LINE RIGHT  01/16/2018  . IR FLUORO GUIDE CV LINE RIGHT  01/31/2018  . IR [place] VASC ACCESS RIGHT  01/16/2018  . IR [place] VASC ACCESS RIGHT  01/31/2018  . PROSTATE BIOPSY    . right eye surgery     to lower eye pressure  . RIGHT HEART CATH N/A 01/19/2018   Procedure: RIGHT HEART CATH;  Surgeon: ENelva Bush MD;  [facility];  Service: Cardiovascular;  Laterality: N/A;  . TONSILLECTOMY    . VIDEO ASSISTED THORACOSCOPY (VATS)/ LOBECTOMY Right 12/26/2015  . wisdom tteeth ext      There were no vitals filed for this visit.  Subjective Assessment - 03/30/18 06222  Subjective  Pt states things are going well. He has no complaints at this time.    Pertinent History  recent hospitalization due to kidney failure, heart failure    Limitations  House hold activities;Walking    Patient Stated Goals  improve his stamina and strength to allow him to get in and out of the car with improved ease     Currently in Pain?  No/denies                       [facility] Adult PT Treatment/Exercise - 03/30/18 0001      Knee/Hip Exercises: Stretches   Sports administrator  2 reps;Both;30 seconds    Quad Stretch Limitations  standing LE in chair     Piriformis Stretch  Both;2 reps;30 seconds     Piriformis Stretch Limitations  seated       Knee/Hip Exercises: Machines for Strengthening   Total Gym Leg Press  Single leg #70 2x10 reps       Knee/Hip Exercises: Standing   Knee Flexion  Right;Left;Strengthening;2 sets;10 reps    SLS  SLS on foam 3x10 sec intermittent UE support     Other Standing Knee Exercises  RLE march x10 reps       Knee/Hip Exercises: Seated   Other Seated Knee/Hip Exercises  long sitting straight leg raise x5 reps AAROM    Marching Limitations  march alternating 3x10 reps with 2# ankle weights              PT Education - 03/30/18 1222    Education Details  technique with therex    Person(s) Educated  Patient    Methods  Explanation;Verbal cues    Comprehension  Verbalized understanding       PT Short Term Goals - 03/30/18 1535      PT SHORT TERM GOAL #1   Title  Pt will be independent with his initial HEP to improve LE strength and stamina.     Time  4    Period  Weeks    Status  Achieved        PT Long Term Goals - 03/30/18 1536      PT LONG TERM GOAL #1   Title  Pt will have increase in LE strength to atleast 4/5 MMT which will improve his efficiceny with daily tasks.     Time  8    Period  Weeks    Status  On-going      PT LONG TERM GOAL #2   Title  Pt will have increased LE strength and power evident by his ability to complete 5x sit to stand without UE support in less than 14 sec.     Baseline  11 sec with UE     Time  8    Period  Weeks    Status  Partially Met      PT LONG TERM GOAL #3   Title  Pt will complete TUG in less than 14 sec with LRAD to reflect improvements in his stability with ambulation.    Baseline  12 sec, no AD    Time  8    Period  Weeks    Status  Achieved      PT LONG TERM GOAL #4   Title  Pt will be independent with his advanced HEP at home and the gym to allow for safe transition following discharge from PT.     Time  8  Period  Weeks    Status  On-going      PT LONG TERM GOAL #5   Title   Pt will report atleast 75% improvement in his strength and participation in daily activity from the start of PT to decrease caregiver support.     Baseline  50%    Time  8    Period  Weeks    Status  Partially Met      PT LONG TERM GOAL #6   Title  Pt will have increased stamina and endurance evident by his ability to ambulate atleast 146f during 6 MWT.    Time  8    Period  Weeks    Status  Unable to assess            Plan - 03/30/18 1537    Clinical Impression Statement  Pt has made steady progress towards his goals, having met 2 of his long term goals at this point in his POC. He is consistently working on his HEP and feels improved overall in his daily activity participation. His balance has improved, as he is no longer using his walker. His functional strength has improved as well, able to complete 5x sit to stand in 11 sec with his UE support. He does still have limitations in endurance, requiring frequent rest breaks during his sessions, and his LE strength limitations are noted with the need for intermittent UE assistance with RLE hip flexion when getting in/out of the car. He would continue to benefit from skilled PT to promote further improvements in LE strength, endurance and return to a gym program.     PT Frequency  2x / week    PT Duration  8 weeks    PT Treatment/Interventions  ADLs/Self Care Home Management;Gait training;Balance training;Therapeutic exercise;Therapeutic activities;Functional mobility training;Stair training;Neuromuscular re-education;Patient/family education;Manual techniques;Passive range of motion    PT Next Visit Plan  6 MWT; progress standing strengthening; hip flexion    PT Home Exercise Plan  [id number]     Consulted and Agree with Plan of Care  Patient       Patient will benefit from skilled therapeutic intervention in order to improve the following deficits and impairments:  Decreased activity tolerance, Decreased strength, Impaired flexibility,  Improper body mechanics, Increased edema, Decreased endurance, Decreased mobility  Visit Diagnosis: Muscle weakness (generalized)  Unsteadiness on feet     Problem List Patient Active Problem List   Diagnosis Date Noted  . Acute renal failure ([place]   . PAF (paroxysmal atrial fibrillation) ([place]   . Pressure injury of skin 01/17/2018  . Intestinal metaplasia of gastric mucosa   . Iron deficiency anemia   . Anasarca   . Malnutrition of moderate degree 01/10/2018  . GI bleed 01/09/2018  . Acute respiratory failure with hypoxia ([place] 11/12/2017  . Gastritis and gastroduodenitis   . Acute gastric ulcer without hemorrhage or perforation   . Duodenal ulcer   . Paraesophageal hernia   . Upper GI bleeding 11/09/2017  . CKD (chronic kidney disease), stage III ([place] 11/09/2017  . Normocytic anemia 11/09/2017  . Acute on chronic diastolic CHF (congestive heart failure) ([place] 11/09/2017  . Melena   . RBBB 07/12/2017  . Pulmonary nodule 02/17/2017  . 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 Malignant neoplasm of left lung ([place] 02/08/2017  . Elevated PSA 11/29/2016  . Aortic valve stenosis, moderate 12/26/2015  . BPH (benign prostatic hyperplasia) 12/18/2015  . Hypertension 12/18/2015  . Primary adenocarcinoma  of upper lobe of right lung ([place]) 12/18/2015  . Heart murmur 11/04/2015  . History of active rheumatic fever 11/04/2015  . Solitary pulmonary nodule 08/31/2013  . Renal malignant tumor ([place]) 08/31/2013  . Thyroid nodule 08/31/2013  . Hyperlipidemia 04/25/2013  . Essential hypertension, benign 06/08/2012     3:41 PM,03/30/18 Sherol Dade PT, DPT [place] [facility] [address] [place], [place], [address] Phone: [phone]   Fax:  [phone]  Name: Andrew Guerra MRN: [id number] Date of Birth: October 01, 1940  *addendum to resolve  episode of care and d/c pt from [facility]  Visits from Start of Care: 10  Current functional level related to goals / functional outcomes: See above for more details    Remaining deficits: See above for more details    Education / Equipment: See above for more details   Plan: Patient agrees to discharge.  Patient goals were partially met. Patient is being discharged due to a change in medical status.  ?????    2:15 PM,12/05/18 [place], [place] at [place]

## 2018-04-03 ENCOUNTER — Other Ambulatory Visit: Payer: Self-pay

## 2018-04-03 ENCOUNTER — Ambulatory Visit (HOSPITAL_COMMUNITY)
Admission: RE | Admit: 2018-04-03 | Discharge: 2018-04-03 | Disposition: A | Discharge status: Home / Self Care | Payer: Medicare Other | Source: Ambulatory Visit | Attending: Nephrology | Admitting: Nephrology

## 2018-04-03 DIAGNOSIS — D631 Anemia in chronic kidney disease: Secondary | ICD-10-CM | POA: Insufficient documentation

## 2018-04-03 DIAGNOSIS — N184 Chronic kidney disease, stage 4 (severe): Secondary | ICD-10-CM | POA: Diagnosis present

## 2018-04-03 MED ORDER — SODIUM CHLORIDE 0.9 % IV SOLN
510.0000 mg | Freq: Once | INTRAVENOUS | Status: AC
  Administered 2018-04-03: 510 mg via INTRAVENOUS
  Filled 2018-04-03: qty 510

## 2018-04-04 ENCOUNTER — Encounter: Payer: Self-pay | Admitting: Physical Therapy

## 2018-04-04 ENCOUNTER — Ambulatory Visit: Payer: Medicare Other | Admitting: Physical Therapy

## 2018-04-04 ENCOUNTER — Other Ambulatory Visit: Payer: Self-pay

## 2018-04-04 ENCOUNTER — Ambulatory Visit: Payer: Medicare Other | Admitting: Physician Assistant

## 2018-04-04 DIAGNOSIS — M6281 Muscle weakness (generalized): Secondary | ICD-10-CM | POA: Diagnosis not present

## 2018-04-04 DIAGNOSIS — R2681 Unsteadiness on feet: Secondary | ICD-10-CM

## 2018-04-04 NOTE — Therapy (Signed)
[facility] [address] [place], [place], [address] Phone: [phone]   Fax:  [phone]  Physical Therapy Treatment  Patient Details  Name: Andrew Guerra MRN: [id number] Date of Birth: [birth year] Referring Provider (PT): Oren Binet, MD   Encounter Date: 04/04/2018  PT End of Session - 04/04/18 0754    Visit Number  11    Date for PT Re-Evaluation  04/17/18    Authorization Type  [employer] medicare     Authorization Time Period  02/16/18 to 04/17/18    Authorization - Visit Number  11    Authorization - Number of Visits  20    PT Start Time  0752    PT Stop Time  0830    PT Time Calculation (min)  38 min    Equipment Utilized During Treatment  Oxygen    Activity Tolerance  No increased pain;Patient tolerated treatment well    Behavior During Therapy  [facility] for tasks assessed/performed       Past Medical History:  Diagnosis Date  . 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  .Marland KitchenCataract    right eye - surgery to remove  . Elevated PSA 11/29/2016   [facility].  Most recent PSA near 11.  He has had atypia on biopsy.  November 2008  . Essential hypertension, benign 06/08/2012  . Full dentures   . Glaucoma   . Heart murmur    since childhood, never has caused any problems  . History of rheumatic fever 11/04/2015  . Hyperlipidemia 04/25/2013  . Hypertension   . Malignant neoplasm of left lung ([place] 02/08/2017  . RBBB 07/12/2017  . Sickle cell trait ([place]    no problems per patient    Past Surgical History:  Procedure Laterality Date  . BIOPSY  11/11/2017   Procedure: BIOPSY;  Surgeon: CLavena Bullion DO;  [place];  Service: Gastroenterology;;  . BIOPSY  01/10/2018   Procedure: BIOPSY;  Surgeon: BThornton Park MD;  [place] [facility]   Service: Gastroenterology;;  . cataract eye surgery Right   . COLONOSCOPY  11/2009   hx polyps/[place]  . ESOPHAGOGASTRODUODENOSCOPY N/A 11/11/2017   Procedure: ESOPHAGOGASTRODUODENOSCOPY (EGD);  Surgeon: CLavena Bullion DO;  [facility];  Service: Gastroenterology;  Laterality: N/A;  . ESOPHAGOGASTRODUODENOSCOPY (EGD) WITH PROPOFOL N/A 01/10/2018   Procedure: ESOPHAGOGASTRODUODENOSCOPY (EGD) WITH PROPOFOL;  Surgeon: BThornton Park MD;  [place]  Service: Gastroenterology;  Laterality: N/A;  . IR FLUORO GUIDE CV LINE RIGHT  01/16/2018  . IR FLUORO GUIDE CV LINE RIGHT  01/31/2018  . IR [place] VASC ACCESS RIGHT  01/16/2018  . IR [place] VASC ACCESS RIGHT  01/31/2018  . PROSTATE BIOPSY    . right eye surgery     to lower eye pressure  . RIGHT HEART CATH N/A 01/19/2018   Procedure: RIGHT HEART CATH;  Surgeon: ENelva Bush MD;  [facility];  Service: Cardiovascular;  Laterality: N/A;  . TONSILLECTOMY    . VIDEO ASSISTED THORACOSCOPY (VATS)/ LOBECTOMY Right 12/26/2015  . wisdom tteeth ext      There were no vitals filed for this visit.  Subjective Assessment - 04/04/18 0754    Subjective  Pt states he is tired this morning but no other complaints.     Pertinent History  recent hospitalization  due to kidney failure, heart failure    Limitations  House hold activities;Walking    Patient Stated Goals  improve his stamina and strength to allow him to get in and out of the car with improved ease     Currently in Pain?  No/denies         [facility] PT Assessment - 04/04/18 0001      6 minute walk test results    Endurance additional comments  6 MWT without AD, 2L O2 663f                    [facility] Adult PT Treatment/Exercise - 04/04/18 0001      Knee/Hip Exercises: Stretches   Passive Hamstring Stretch  Right;Left;2 reps;30 seconds    Passive Hamstring Stretch Limitations  standing LE on step     Quad Stretch  2 reps;Both;30  seconds    Quad Stretch Limitations  standing LE in chair       Knee/Hip Exercises: Machines for Strengthening   Total Gym Leg Press  single leg #80 2x10 reps seat 7       Knee/Hip Exercises: Standing   Knee Flexion  Right;Left;Strengthening;2 sets;10 reps    Knee Flexion Limitations  3    Hip Abduction  Stengthening;Right;Left;2 sets;10 reps;Knee straight    Abduction Limitations  1st set with red TB, 2nd set with green TB              PT Education - 04/04/18 0837    Education Details  technique with therex; walking progression at home    Person(s) Educated  Patient    Methods  Explanation;Verbal cues    Comprehension  Verbalized understanding       PT Short Term Goals - 03/30/18 1535      PT SHORT TERM GOAL #1   Title  Pt will be independent with his initial HEP to improve LE strength and stamina.     Time  4    Period  Weeks    Status  Achieved        PT Long Term Goals - 04/04/18 0817      PT LONG TERM GOAL #1   Title  Pt will have increase in LE strength to atleast 4/5 MMT which will improve his efficiceny with daily tasks.     Time  8    Period  Weeks    Status  On-going      PT LONG TERM GOAL #2   Title  Pt will have increased LE strength and power evident by his ability to complete 5x sit to stand without UE support in less than 14 sec.     Baseline  11 sec with UE     Time  8    Period  Weeks    Status  Partially Met      PT LONG TERM GOAL #3   Title  Pt will complete TUG in less than 14 sec with LRAD to reflect improvements in his stability with ambulation.    Baseline  12 sec, no AD    Time  8    Period  Weeks    Status  Achieved      PT LONG TERM GOAL #4   Title  Pt will be independent with his advanced HEP at home and the gym to allow for safe transition following discharge from PT.     Time  8    Period  Weeks  Status  On-going      PT LONG TERM GOAL #5   Title  Pt will report atleast 75% improvement in his strength and participation  in daily activity from the start of PT to decrease caregiver support.     Baseline  50%    Time  8    Period  Weeks    Status  Partially Met      PT LONG TERM GOAL #6   Title  Pt will have increased stamina and endurance evident by his ability to ambulate atleast 1429f during 6 MWT.    Baseline  640 ft without AD    Time  8    Period  Weeks    Status  On-going            Plan - 04/04/18 0826    Clinical Impression Statement  Pt demonstrates some improvements in his endurance and stability this visit, evident by his ability to increase his walking distance by 80 ft without an AD compared to his performance at the evaluation. In addition, he was able to progress resistance with all other strengthening exercises without significant difficulty. Continue to provide pt with adequate rest breaks to encourage proper breathing and oxygen saturation during exercise recovery. His HEP was updated to reflect improvements in strength and therapist discussed walking progression for home as well. Will continue with current POC.     PT Frequency  2x / week    PT Duration  8 weeks    PT Treatment/Interventions  ADLs/Self Care Home Management;Gait training;Balance training;Therapeutic exercise;Therapeutic activities;Functional mobility training;Stair training;Neuromuscular re-education;Patient/family education;Manual techniques;Passive range of motion    PT Next Visit Plan  progress leg strengthening; sit to stand with seat elevation    PT Home Exercise Plan  [id number]     Consulted and Agree with Plan of Care  Patient       Patient will benefit from skilled therapeutic intervention in order to improve the following deficits and impairments:  Decreased activity tolerance, Decreased strength, Impaired flexibility, Improper body mechanics, Increased edema, Decreased endurance, Decreased mobility  Visit Diagnosis: Muscle weakness (generalized)  Unsteadiness on feet     Problem List Patient Active  Problem List   Diagnosis Date Noted  . Acute renal failure (HE[place] Farming[place]   . PAF (paroxysmal atrial fibrillation) (HMendoc[place]   . Pressure injury of skin 01/17/2018  . Intestinal metaplasia of gastric mucosa   . Iron deficiency anemia   . Anasarca   . Malnutrition of moderate degree 01/10/2018  . GI bleed 01/09/2018  . Acute respiratory failure with hypoxia ([place] 11/12/2017  . Gastritis and gastroduodenitis   . Acute gastric ulcer without hemorrhage or perforation   . Duodenal ulcer   . Paraesophageal hernia   . Upper GI bleeding 11/09/2017  . CKD (chronic kidney disease), stage III (HO[place] 11/09/2017  . Normocytic anemia 11/09/2017  . Acute on chronic diastolic CHF (congestive heart failure) ([place]Jam[place] 11/09/2017  . Melena   . RBBB 07/12/2017  . Pulmonary nodule 02/17/2017  . 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 Malignant neoplasm of left lung (HWilson 02/08/2017  . Elevated PSA 11/29/2016  . Aortic valve stenosis, moderate 12/26/2015  . BPH (benign prostatic hyperplasia) 12/18/2015  . Hypertension 12/18/2015  . 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[place]Stat[place] 12/18/2015  . Heart murmur 11/04/2015  . History of active rheumatic fever 11/04/2015  . Solitary pulmonary nodule 08/31/2013  . Renal malignant tumor ([place] 08/31/2013  . Thyroid nodule  08/31/2013  . Hyperlipidemia 04/25/2013  . Essential hypertension, benign 06/08/2012    8:37 AM,04/04/18 Sherol Dade PT, DPT [place] [facility] [address] [place], [place], [address] Phone: [phone]   Fax:  [phone]  Name: Andrew Guerra MRN: [id number] Date of Birth: 1941/01/08

## 2018-04-06 ENCOUNTER — Other Ambulatory Visit: Payer: Self-pay

## 2018-04-06 ENCOUNTER — Encounter: Payer: Self-pay | Admitting: Physical Therapy

## 2018-04-06 ENCOUNTER — Ambulatory Visit: Payer: Medicare Other | Admitting: Physical Therapy

## 2018-04-06 ENCOUNTER — Encounter: Payer: Medicare Other | Admitting: Physical Therapy

## 2018-04-06 ENCOUNTER — Other Ambulatory Visit: Payer: Self-pay | Admitting: Family Medicine

## 2018-04-06 DIAGNOSIS — M6281 Muscle weakness (generalized): Secondary | ICD-10-CM | POA: Diagnosis not present

## 2018-04-06 DIAGNOSIS — R2681 Unsteadiness on feet: Secondary | ICD-10-CM

## 2018-04-06 NOTE — Therapy (Signed)
[facility] [address], [address] [place], [place], [address] Phone: [phone]   Fax:  [phone]  Physical Therapy Treatment  Patient Details  Name: Andrew Guerra MRN: [id number] Date of Birth: 07/22/1940 Referring Provider (PT): Shanker Ghimire, MD   Encounter Date: 04/06/2018  PT End of Session - 04/06/18 1035    Visit Number  12    Date for PT Re-Evaluation  04/17/18    Authorization Type  [employer] medicare     Authorization Time Period  02/16/18 to 04/17/18    Authorization - Visit Number  12    Authorization - Number of Visits  20    PT Start Time  1010    PT Stop Time  1050    PT Time Calculation (min)  40 min    Equipment Utilized During Treatment  Oxygen    Activity Tolerance  No increased pain;Patient tolerated treatment well    Behavior During Therapy  WFL for tasks assessed/performed       Past Medical History:  Diagnosis Date  . 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  . Cataract    right eye - surgery to remove  . Elevated PSA 11/29/2016   Patient is followed by [facility].  Most recent PSA near 11.  He has had atypia on biopsy.  November 2008  . Essential hypertension, benign 06/08/2012  . Full dentures   . Glaucoma   . Heart murmur    since childhood, never has caused any problems  . History of rheumatic fever 11/04/2015  . Hyperlipidemia 04/25/2013  . Hypertension   . Malignant neoplasm of left lung (HCC) 02/08/2017  . RBBB 07/12/2017  . Sickle cell trait (HCC)    no problems per patient    Past Surgical History:  Procedure Laterality Date  . BIOPSY  11/11/2017   Procedure: BIOPSY;  Surgeon: Cirigliano, Vito V, DO;  Location: [facility];  Service: Gastroenterology;;  . BIOPSY  01/10/2018   Procedure: BIOPSY;  Surgeon: Beavers, Kimberly, MD;  Location: [facility];   Service: Gastroenterology;;  . cataract eye surgery Right   . COLONOSCOPY  11/2009   hx polyps/[place]  . ESOPHAGOGASTRODUODENOSCOPY N/A 11/11/2017   Procedure: ESOPHAGOGASTRODUODENOSCOPY (EGD);  Surgeon: Cirigliano, Vito V, DO;  Location: [facility];  Service: Gastroenterology;  Laterality: N/A;  . ESOPHAGOGASTRODUODENOSCOPY (EGD) WITH PROPOFOL N/A 01/10/2018   Procedure: ESOPHAGOGASTRODUODENOSCOPY (EGD) WITH PROPOFOL;  Surgeon: Beavers, Kimberly, MD;  Location: [facility];  Service: Gastroenterology;  Laterality: N/A;  . IR FLUORO GUIDE CV LINE RIGHT  01/16/2018  . IR FLUORO GUIDE CV LINE RIGHT  01/31/2018  . IR US GUIDE VASC ACCESS RIGHT  01/16/2018  . IR US GUIDE VASC ACCESS RIGHT  01/31/2018  . PROSTATE BIOPSY    . right eye surgery     to lower eye pressure  . RIGHT HEART CATH N/A 01/19/2018   Procedure: RIGHT HEART CATH;  Surgeon: End, Christopher, MD;  Location: [facility];  Service: Cardiovascular;  Laterality: N/A;  . TONSILLECTOMY    . VIDEO ASSISTED THORACOSCOPY (VATS)/ LOBECTOMY Right 12/26/2015  . wisdom tteeth ext      There were no vitals filed for this visit.  Subjective Assessment - 04/06/18 1013    Subjective  Pt states things are going ok. He is noting improvements in getting his leg in and out of the car.       Pertinent History  recent hospitalization due to kidney failure, heart failure    Limitations  House hold activities;Walking    Patient Stated Goals  improve his stamina and strength to allow him to get in and out of the car with improved ease     Currently in Pain?  No/denies                       [facility] Adult PT Treatment/Exercise - 04/06/18 0001      Knee/Hip Exercises: Machines for Strengthening   Total Gym Leg Press  single leg #80 2x10 reps seat 7     Other Machine  power tower: tricep extension #35x10, #45 2x10; bicep curls with #35 x6 decreased to #30 for 4 reps, #25 x10 reps       Knee/Hip Exercises: Standing   Hip Flexion   Stengthening;Right;2 sets;10 reps    Hip Flexion Limitations  1 UE support     Hip Extension  Stengthening;Right;Left;2 sets;10 reps    Extension Limitations  red TB     SLS with Vectors  each LE 3x10 sec hold with intermittent UE support      Knee/Hip Exercises: Seated   Knee/Hip Flexion  RLE over foam roll x5 reps, 2x5 reps over half foam roll              PT Education - 04/06/18 1056    Education Details  ways to work on upper body strength at home    Person(s) Educated  Patient    Methods  Explanation;Demonstration    Comprehension  Verbalized understanding       PT Short Term Goals - 03/30/18 1535      PT SHORT TERM GOAL #1   Title  Pt will be independent with his initial HEP to improve LE strength and stamina.     Time  4    Period  Weeks    Status  Achieved        PT Long Term Goals - 04/04/18 0817      PT LONG TERM GOAL #1   Title  Pt will have increase in LE strength to atleast 4/5 MMT which will improve his efficiceny with daily tasks.     Time  8    Period  Weeks    Status  On-going      PT LONG TERM GOAL #2   Title  Pt will have increased LE strength and power evident by his ability to complete 5x sit to stand without UE support in less than 14 sec.     Baseline  11 sec with UE     Time  8    Period  Weeks    Status  Partially Met      PT LONG TERM GOAL #3   Title  Pt will complete TUG in less than 14 sec with LRAD to reflect improvements in his stability with ambulation.    Baseline  12 sec, no AD    Time  8    Period  Weeks    Status  Achieved      PT LONG TERM GOAL #4   Title  Pt will be independent with his advanced HEP at home and the gym to allow for safe transition following discharge from PT.     Time  8    Period  Weeks    Status  On-going      PT LONG TERM GOAL #5   Title    Pt will report atleast 75% improvement in his strength and participation in daily activity from the start of PT to decrease caregiver support.     Baseline   50%    Time  8    Period  Weeks    Status  Partially Met      PT LONG TERM GOAL #6   Title  Pt will have increased stamina and endurance evident by his ability to ambulate atleast 1400ft during 6 MWT.    Baseline  640 ft without AD    Time  8    Period  Weeks    Status  On-going            Plan - 04/06/18 1036    Clinical Impression Statement  Pt has improved Rt hip flexor strength, evident by his ability to complete seated hip march over small hurdle without the need for UE assistance during today's session. Continued with exercise progressions, and included UE strengthening per pt's request in order to improve his ability to use UE assist during sit to stand and hold his oxygen tank throughout the day. Pt required therapist cuing to breath properly, but demonstrated no increase in shortness of breath from baseline during today's exercises. Therapist was able to demonstrate ways to further promote UE strength at home and he verbalized understanding at this time.     PT Frequency  2x / week    PT Duration  8 weeks    PT Treatment/Interventions  ADLs/Self Care Home Management;Gait training;Balance training;Therapeutic exercise;Therapeutic activities;Functional mobility training;Stair training;Neuromuscular re-education;Patient/family education;Manual techniques;Passive range of motion    PT Next Visit Plan  progress leg strengthening; sit to stand with seat elevation; UE strength progression and HEP for triceps, etc.     PT Home Exercise Plan  [id number]     Consulted and Agree with Plan of Care  Patient       Patient will benefit from skilled therapeutic intervention in order to improve the following deficits and impairments:  Decreased activity tolerance, Decreased strength, Impaired flexibility, Improper body mechanics, Increased edema, Decreased endurance, Decreased mobility  Visit Diagnosis: Muscle weakness (generalized)  Unsteadiness on feet     Problem List Patient Active  Problem List   Diagnosis Date Noted  . Acute renal failure (HCC)   . PAF (paroxysmal atrial fibrillation) (HCC)   . Pressure injury of skin 01/17/2018  . Intestinal metaplasia of gastric mucosa   . Iron deficiency anemia   . Anasarca   . Malnutrition of moderate degree 01/10/2018  . GI bleed 01/09/2018  . Acute respiratory failure with hypoxia (HCC) 11/12/2017  . Gastritis and gastroduodenitis   . Acute gastric ulcer without hemorrhage or perforation   . Duodenal ulcer   . Paraesophageal hernia   . Upper GI bleeding 11/09/2017  . CKD (chronic kidney disease), stage III (HCC) 11/09/2017  . Normocytic anemia 11/09/2017  . Acute on chronic diastolic CHF (congestive heart failure) (HCC) 11/09/2017  . Melena   . RBBB 07/12/2017  . Pulmonary nodule 02/17/2017  . 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 Malignant neoplasm of left lung (HCC) 02/08/2017  . Elevated PSA 11/29/2016  . Aortic valve stenosis, moderate 12/26/2015  . BPH (benign prostatic hyperplasia) 12/18/2015  . Hypertension 12/18/2015  . 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HCC) 12/18/2015  . Heart murmur 11/04/2015  . History of active rheumatic fever 11/04/2015  . Solitary pulmonary nodule 08/31/2013  . Renal malignant tumor (HCC) 08/31/2013  .   Thyroid nodule 08/31/2013  . Hyperlipidemia 04/25/2013  . Essential hypertension, benign 06/08/2012    11:01 AM,04/06/18 Sherol Dade PT, DPT [place] [facility] [address], [place], [place], [address] Phone: [phone]   Fax:  [phone]  Name: Andrew Guerra MRN: [id number] Date of Birth: 1940-02-25

## 2018-04-10 ENCOUNTER — Ambulatory Visit (HOSPITAL_COMMUNITY)
Admission: RE | Admit: 2018-04-10 | Discharge: 2018-04-10 | Disposition: A | Discharge status: Home / Self Care | Payer: Medicare Other | Source: Ambulatory Visit | Attending: Nephrology | Admitting: Nephrology

## 2018-04-10 ENCOUNTER — Other Ambulatory Visit: Payer: Self-pay

## 2018-04-10 DIAGNOSIS — N184 Chronic kidney disease, stage 4 (severe): Secondary | ICD-10-CM | POA: Insufficient documentation

## 2018-04-10 DIAGNOSIS — D631 Anemia in chronic kidney disease: Secondary | ICD-10-CM | POA: Diagnosis not present

## 2018-04-10 LAB — POCT HEMOGLOBIN-HEMACUE: Hemoglobin: 7.6 g/dL — ABNORMAL LOW (ref 13.0–17.0)

## 2018-04-10 MED ORDER — EPOETIN ALFA-EPBX 10000 UNIT/ML IJ SOLN
10000.0000 [IU] | Freq: Once | INTRAMUSCULAR | Status: AC
  Administered 2018-04-10: 10000 [IU] via SUBCUTANEOUS
  Filled 2018-04-10: qty 1

## 2018-04-11 ENCOUNTER — Encounter: Payer: Medicare Other | Admitting: Physical Therapy

## 2018-04-13 ENCOUNTER — Encounter: Payer: Medicare Other | Admitting: Physical Therapy

## 2018-04-18 ENCOUNTER — Encounter: Payer: Medicare Other | Admitting: Physical Therapy

## 2018-04-20 ENCOUNTER — Encounter: Payer: Medicare Other | Admitting: Physical Therapy

## 2018-04-21 ENCOUNTER — Other Ambulatory Visit (HOSPITAL_COMMUNITY): Payer: Self-pay | Admitting: *Deleted

## 2018-04-24 ENCOUNTER — Ambulatory Visit (HOSPITAL_COMMUNITY)
Admission: RE | Admit: 2018-04-24 | Discharge: 2018-04-24 | Disposition: A | Discharge status: Home / Self Care | Payer: Medicare Other | Source: Ambulatory Visit | Attending: Nephrology | Admitting: Nephrology

## 2018-04-24 ENCOUNTER — Telehealth: Payer: Self-pay | Admitting: Family Medicine

## 2018-04-24 DIAGNOSIS — N184 Chronic kidney disease, stage 4 (severe): Secondary | ICD-10-CM | POA: Insufficient documentation

## 2018-04-24 DIAGNOSIS — D631 Anemia in chronic kidney disease: Secondary | ICD-10-CM | POA: Insufficient documentation

## 2018-04-24 LAB — POCT HEMOGLOBIN-HEMACUE: Hemoglobin: 6.6 g/dL — CL (ref 13.0–17.0)

## 2018-04-24 MED ORDER — EPOETIN ALFA-EPBX 10000 UNIT/ML IJ SOLN
30000.0000 [IU] | Freq: Once | INTRAMUSCULAR | Status: AC
  Administered 2018-04-24: 10:00:00 30000 [IU] via SUBCUTANEOUS
  Filled 2018-04-24: qty 3

## 2018-04-24 NOTE — Progress Notes (Signed)
Pt's hemocue is 6.6.  Pt denies any symptoms or sighting of bleeding. Reported to [place] office, ie Erline Levine

## 2018-04-24 NOTE — Telephone Encounter (Signed)
Unfortunately this is very concerning His hemoglobin earlier today was 6.6 which is in a critical low range With black tarry stools this is a sign of intestinal bleeding This is not something that he should just hope it will go away Based on the circumstances he should go to the emergency department for further evaluation for probable GI bleed It would be necessary for him to go to [facility], ER-he should go today Please call ahead to the triage to let them know of his situation

## 2018-04-24 NOTE — Telephone Encounter (Signed)
Discussed with pt. Pt verbalized understanding. States [facility] will [facility] to [facility]  now and triage [facility] at [facility] notified.

## 2018-04-24 NOTE — Telephone Encounter (Signed)
Pt contacted office due to experiencing black tarry stools. Pt has been going back and forth to hospital for injection for hemoglobin. Mushy type black stools. Pt had been to hospital for this before. Pt states he has had this problem before. No other symptoms. Eating well. Pt states his hemoglobin was low this morning. Pt was given 3 shot today and then when he got home he noticed the black tarry stools. Pt states he is feeling fine. Please advise. Thank you

## 2018-04-24 NOTE — Discharge Instructions (Signed)
Epoetin Alfa injection °What is this medicine? °EPOETIN ALFA (e POE e tin AL fa) helps your body make more red blood cells. This medicine is used to treat anemia caused by chronic kidney disease, cancer chemotherapy, or HIV-therapy. It may also be used before surgery if you have anemia. °This medicine may be used for other purposes; ask your health care provider or pharmacist if you have questions. °COMMON BRAND NAME(S): Epogen, Procrit, Retacrit °What should I tell my health care provider before I take this medicine? °They need to know if you have any of these conditions: °-cancer °-heart disease °-high blood pressure °-history of blood clots °-history of stroke °-low levels of folate, iron, or vitamin B12 in the blood °-seizures °-an unusual or allergic reaction to erythropoietin, albumin, benzyl alcohol, hamster proteins, other medicines, foods, dyes, or preservatives °-pregnant or trying to get pregnant °-breast-feeding °How should I use this medicine? °This medicine is for injection into a vein or under the skin. It is usually given by a health care professional in a hospital or clinic setting. °If you get this medicine at home, you will be taught how to prepare and give this medicine. Use exactly as directed. Take your medicine at regular intervals. Do not take your medicine more often than directed. °It is important that you put your used needles and syringes in a special sharps container. Do not put them in a trash can. If you do not have a sharps container, call your pharmacist or healthcare provider to get one. °A special MedGuide will be given to you by the pharmacist with each prescription and refill. Be sure to read this information carefully each time. °Talk to your pediatrician regarding the use of this medicine in children. While this drug may be prescribed for selected conditions, precautions do apply. °Overdosage: If you think you have taken too much of this medicine contact a poison control center  or emergency room at once. °NOTE: This medicine is only for you. Do not share this medicine with others. °What if I miss a dose? °If you miss a dose, take it as soon as you can. If it is almost time for your next dose, take only that dose. Do not take double or extra doses. °What may interact with this medicine? °Interactions have not been studied. °This list may not describe all possible interactions. Give your health care provider a list of all the medicines, herbs, non-prescription drugs, or dietary supplements you use. Also tell them if you smoke, drink alcohol, or use illegal drugs. Some items may interact with your medicine. °What should I watch for while using this medicine? °Your condition will be monitored carefully while you are receiving this medicine. °You may need blood work done while you are taking this medicine. °This medicine may cause a decrease in vitamin B6. You should make sure that you get enough vitamin B6 while you are taking this medicine. Discuss the foods you eat and the vitamins you take with your health care professional. °What side effects may I notice from receiving this medicine? °Side effects that you should report to your doctor or health care professional as soon as possible: °-allergic reactions like skin rash, itching or hives, swelling of the face, lips, or tongue °-seizures °-signs and symptoms of a blood clot such as breathing problems; changes in vision; chest pain; severe, sudden headache; pain, swelling, warmth in the leg; trouble speaking; sudden numbness or weakness of the face, arm or leg °-signs and symptoms of a stroke   like changes in vision; confusion; trouble speaking or understanding; severe headaches; sudden numbness or weakness of the face, arm or leg; trouble walking; dizziness; loss of balance or coordination °Side effects that usually do not require medical attention (report to your doctor or health care professional if they continue or are  bothersome): °-chills °-cough °-dizziness °-fever °-headaches °-joint pain °-muscle cramps °-muscle pain °-nausea, vomiting °-pain, redness, or irritation at site where injected °This list may not describe all possible side effects. Call your doctor for medical advice about side effects. You may report side effects to FDA at 1-800-FDA-1088. °Where should I keep my medicine? °Keep out of the reach of children. °Store in a refrigerator between 2 and 8 degrees C (36 and 46 degrees F). Do not freeze or shake. Throw away any unused portion if using a single-dose vial. Multi-dose vials can be kept in the refrigerator for up to 21 days after the initial dose. Throw away unused medicine. °NOTE: This sheet is a summary. It may not cover all possible information. If you have questions about this medicine, talk to your doctor, pharmacist, or health care provider. °© 2019 [employer] (2016-08-13 08:35:19) ° °

## 2018-04-25 ENCOUNTER — Telehealth: Payer: Self-pay | Admitting: Family Medicine

## 2018-04-25 ENCOUNTER — Encounter: Payer: Self-pay | Admitting: Family Medicine

## 2018-04-25 NOTE — Telephone Encounter (Signed)
I had a conversation with the patient.  He is being followed by nephrology.  His hemoglobin yesterday was 6.6.  They are giving him an injection coming up to help this come up.  He did have 1 dark bowel movement but he states no others.  He is taking all of his other medicines.  And denies any other problems no shortness of breath or chest pain.  He will keep following with Dr. Posey Pronto the nephrologist and he will let us know if any problems.  The patient was warned that if he starts having multiple black tarry stools or maroon-looking stools that he would need to be checked right away

## 2018-04-26 ENCOUNTER — Ambulatory Visit: Payer: Medicare Other | Admitting: Physical Therapy

## 2018-04-28 ENCOUNTER — Telehealth: Payer: Self-pay | Admitting: Cardiology

## 2018-04-28 ENCOUNTER — Other Ambulatory Visit: Payer: Self-pay

## 2018-04-28 NOTE — Telephone Encounter (Signed)
New Message:    Patient states that some call him and he thinks it is concerning appt. Please call patient back.

## 2018-04-28 NOTE — Telephone Encounter (Signed)
Spoke to patients wife. I have enrolled patient for a [employer] long term monitor to be mailed to to Covid-19. All instructions were gone over and wife knows to expect monitor in 3-4 days

## 2018-05-01 ENCOUNTER — Ambulatory Visit (HOSPITAL_COMMUNITY)
Admission: RE | Admit: 2018-05-01 | Discharge: 2018-05-01 | Disposition: A | Discharge status: Home / Self Care | Payer: Medicare Other | Source: Ambulatory Visit | Attending: Nephrology | Admitting: Nephrology

## 2018-05-01 ENCOUNTER — Inpatient Hospital Stay (HOSPITAL_COMMUNITY): Admission: RE | Admit: 2018-05-01 | Payer: Medicare Other | Source: Ambulatory Visit

## 2018-05-01 ENCOUNTER — Encounter (HOSPITAL_COMMUNITY): Payer: Self-pay | Admitting: *Deleted

## 2018-05-01 ENCOUNTER — Emergency Department (HOSPITAL_COMMUNITY)
Admission: EM | Admit: 2018-05-01 | Discharge: 2018-05-01 | Disposition: A | Discharge status: Home / Self Care | Payer: Medicare Other | Attending: Emergency Medicine | Admitting: Emergency Medicine

## 2018-05-01 ENCOUNTER — Other Ambulatory Visit: Payer: Self-pay

## 2018-05-01 VITALS — BP 135/57 | HR 95 | Temp 97.7°F | Resp 20

## 2018-05-01 DIAGNOSIS — D649 Anemia, unspecified: Secondary | ICD-10-CM | POA: Diagnosis not present

## 2018-05-01 DIAGNOSIS — R0602 Shortness of breath: Secondary | ICD-10-CM | POA: Diagnosis present

## 2018-05-01 DIAGNOSIS — I130 Hypertensive heart and chronic kidney disease with heart failure and stage 1 through stage 4 chronic kidney disease, or unspecified chronic kidney disease: Secondary | ICD-10-CM | POA: Diagnosis not present

## 2018-05-01 DIAGNOSIS — Z85118 Personal history of other malignant neoplasm of bronchus and lung: Secondary | ICD-10-CM | POA: Diagnosis not present

## 2018-05-01 DIAGNOSIS — N183 Chronic kidney disease, stage 3 (moderate): Secondary | ICD-10-CM | POA: Diagnosis not present

## 2018-05-01 DIAGNOSIS — D508 Other iron deficiency anemias: Secondary | ICD-10-CM | POA: Insufficient documentation

## 2018-05-01 DIAGNOSIS — Z87891 Personal history of nicotine dependence: Secondary | ICD-10-CM | POA: Insufficient documentation

## 2018-05-01 DIAGNOSIS — I5032 Chronic diastolic (congestive) heart failure: Secondary | ICD-10-CM | POA: Diagnosis not present

## 2018-05-01 DIAGNOSIS — I480 Paroxysmal atrial fibrillation: Secondary | ICD-10-CM | POA: Insufficient documentation

## 2018-05-01 DIAGNOSIS — Z79899 Other long term (current) drug therapy: Secondary | ICD-10-CM | POA: Insufficient documentation

## 2018-05-01 LAB — IRON AND TIBC
Iron: 14 ug/dL — ABNORMAL LOW (ref 45–182)
Saturation Ratios: 4 % — ABNORMAL LOW (ref 17.9–39.5)
TIBC: 314 ug/dL (ref 250–450)
UIBC: 300 ug/dL

## 2018-05-01 LAB — CBC WITH DIFFERENTIAL/PLATELET
Abs Immature Granulocytes: 0.08 10*3/uL — ABNORMAL HIGH (ref 0.00–0.07)
Abs Immature Granulocytes: 0 10*3/uL (ref 0.00–0.07)
Basophils Absolute: 0 10*3/uL (ref 0.0–0.1)
Basophils Absolute: 0 10*3/uL (ref 0.0–0.1)
Basophils Relative: 0 %
Basophils Relative: 0 %
Eosinophils Absolute: 0.2 10*3/uL (ref 0.0–0.5)
Eosinophils Absolute: 0.2 10*3/uL (ref 0.0–0.5)
Eosinophils Relative: 2 %
Eosinophils Relative: 2 %
HCT: 17.6 % — ABNORMAL LOW (ref 39.0–52.0)
HCT: 19.6 % — ABNORMAL LOW (ref 39.0–52.0)
Hemoglobin: 5.4 g/dL — CL (ref 13.0–17.0)
Hemoglobin: 5.7 g/dL — CL (ref 13.0–17.0)
Immature Granulocytes: 1 %
Lymphocytes Relative: 13 %
Lymphocytes Relative: 10 %
Lymphs Abs: 1.1 10*3/uL (ref 0.7–4.0)
Lymphs Abs: 0.9 10*3/uL (ref 0.7–4.0)
MCH: 24.1 pg — ABNORMAL LOW (ref 26.0–34.0)
MCH: 22.9 pg — ABNORMAL LOW (ref 26.0–34.0)
MCHC: 30.7 g/dL (ref 30.0–36.0)
MCHC: 29.1 g/dL — ABNORMAL LOW (ref 30.0–36.0)
MCV: 78.6 fL — ABNORMAL LOW (ref 80.0–100.0)
MCV: 78.7 fL — ABNORMAL LOW (ref 80.0–100.0)
Monocytes Absolute: 0.7 10*3/uL (ref 0.1–1.0)
Monocytes Absolute: 0.3 10*3/uL (ref 0.1–1.0)
Monocytes Relative: 8 %
Monocytes Relative: 3 %
Neutro Abs: 6.7 10*3/uL (ref 1.7–7.7)
Neutro Abs: 7.8 10*3/uL — ABNORMAL HIGH (ref 1.7–7.7)
Neutrophils Relative %: 76 %
Neutrophils Relative %: 85 %
Platelets: 297 10*3/uL (ref 150–400)
Platelets: 315 10*3/uL (ref 150–400)
RBC: 2.24 MIL/uL — ABNORMAL LOW (ref 4.22–5.81)
RBC: 2.49 MIL/uL — ABNORMAL LOW (ref 4.22–5.81)
RDW: 21.6 % — ABNORMAL HIGH (ref 11.5–15.5)
RDW: 21.1 % — ABNORMAL HIGH (ref 11.5–15.5)
WBC: 8.7 10*3/uL (ref 4.0–10.5)
WBC: 9.2 10*3/uL (ref 4.0–10.5)
nRBC: 0 % (ref 0.0–0.2)
nRBC: 0.2 % (ref 0.0–0.2)
nRBC: 1 /100 WBC — ABNORMAL HIGH

## 2018-05-01 LAB — COMPREHENSIVE METABOLIC PANEL
ALT: 17 U/L (ref 0–44)
AST: 25 U/L (ref 15–41)
Albumin: 2.1 g/dL — ABNORMAL LOW (ref 3.5–5.0)
Alkaline Phosphatase: 88 U/L (ref 38–126)
Anion gap: 10 (ref 5–15)
BUN: 95 mg/dL — ABNORMAL HIGH (ref 8–23)
CO2: 27 mmol/L (ref 22–32)
Calcium: 8.8 mg/dL — ABNORMAL LOW (ref 8.9–10.3)
Chloride: 103 mmol/L (ref 98–111)
Creatinine, Ser: 2.63 mg/dL — ABNORMAL HIGH (ref 0.61–1.24)
GFR calc Af Amer: 26 mL/min — ABNORMAL LOW (ref >60)
GFR calc non Af Amer: 22 mL/min — ABNORMAL LOW (ref >60)
Glucose, Bld: 106 mg/dL — ABNORMAL HIGH (ref 70–99)
Potassium: 4.2 mmol/L (ref 3.5–5.1)
Sodium: 140 mmol/L (ref 135–145)
Total Bilirubin: 0.4 mg/dL (ref 0.3–1.2)
Total Protein: 5.6 g/dL — ABNORMAL LOW (ref 6.5–8.1)

## 2018-05-01 LAB — RENAL FUNCTION PANEL
Albumin: 2 g/dL — ABNORMAL LOW (ref 3.5–5.0)
Anion gap: 10 (ref 5–15)
BUN: 89 mg/dL — ABNORMAL HIGH (ref 8–23)
CO2: 25 mmol/L (ref 22–32)
Calcium: 8.5 mg/dL — ABNORMAL LOW (ref 8.9–10.3)
Chloride: 103 mmol/L (ref 98–111)
Creatinine, Ser: 2.63 mg/dL — ABNORMAL HIGH (ref 0.61–1.24)
GFR calc Af Amer: 26 mL/min — ABNORMAL LOW (ref >60)
GFR calc non Af Amer: 22 mL/min — ABNORMAL LOW (ref >60)
Glucose, Bld: 99 mg/dL (ref 70–99)
Phosphorus: 4.1 mg/dL (ref 2.5–4.6)
Potassium: 4 mmol/L (ref 3.5–5.1)
Sodium: 138 mmol/L (ref 135–145)

## 2018-05-01 LAB — POC OCCULT BLOOD, ED: Fecal Occult Bld: NEGATIVE

## 2018-05-01 LAB — PREPARE RBC (CROSSMATCH)

## 2018-05-01 LAB — HEMOGLOBIN AND HEMATOCRIT, BLOOD
HCT: 23.8 % — ABNORMAL LOW (ref 39.0–52.0)
Hemoglobin: 7.1 g/dL — ABNORMAL LOW (ref 13.0–17.0)

## 2018-05-01 LAB — POCT HEMOGLOBIN-HEMACUE: Hemoglobin: 5.4 g/dL — CL (ref 13.0–17.0)

## 2018-05-01 LAB — MAGNESIUM: Magnesium: 2.2 mg/dL (ref 1.7–2.4)

## 2018-05-01 LAB — FERRITIN: Ferritin: 79 ng/mL (ref 24–336)

## 2018-05-01 MED ORDER — SODIUM CHLORIDE 0.9 % IV SOLN
10.0000 mL/h | Freq: Once | INTRAVENOUS | Status: AC
  Administered 2018-05-01: 13:00:00 10 mL/h via INTRAVENOUS

## 2018-05-01 MED ORDER — EPOETIN ALFA-EPBX 40000 UNIT/ML IJ SOLN
30000.0000 [IU] | INTRAMUSCULAR | Status: DC
  Administered 2018-05-01: 30000 [IU] via SUBCUTANEOUS
  Filled 2018-05-01: qty 1

## 2018-05-01 NOTE — ED Triage Notes (Signed)
PT sent to ED for transfusion.

## 2018-05-01 NOTE — Discharge Instructions (Addendum)
Please be sure to follow-up with your physician or return here for any concerning changes in your condition.

## 2018-05-01 NOTE — ED Provider Notes (Signed)
[facility] EMERGENCY DEPARTMENT Provider Note   CSN: [id number] Arrival date & time: 05/01/18  1000    History   Chief Complaint Chief Complaint  Patient presents with  . Shortness of Breath    HPI Andrew Guerra is a 78 y.o. male.     HPI Patient has history of anemia which is being monitored.  Patient denies increased fatigue, shortness of breath, chest pain.  States he has had intermittently dark stools.  Noted to have hemoglobin of 5.4 this morning on repeat lab draw.  Advised to come to the emergency department for blood transfusion.  Patient currently denies any acute symptoms. Past Medical History:  Diagnosis Date  . 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  [name] Cataract    right eye - surgery to remove  . Elevated PSA 11/29/2016   Patient is followed by [facility].  Most recent PSA near 11.  He has had atypia on biopsy.  November 2008  . Essential hypertension, benign 06/08/2012  . Full dentures   . Glaucoma   . Heart murmur    since childhood, never has caused any problems  . History of rheumatic fever 11/04/2015  . Hyperlipidemia 04/25/2013  . Hypertension   . Malignant neoplasm of left lung ([place]) 02/08/2017  . RBBB 07/12/2017  . Sickle cell trait ([place])    no problems per patient    Patient Active Problem List   Diagnosis Date Noted  . Acute renal failure ([place])   . PAF (paroxysmal atrial fibrillation) ([place])   . Pressure injury of skin 01/17/2018  . Intestinal metaplasia of gastric mucosa   . Iron deficiency anemia   . Anasarca   . Malnutrition of moderate degree 01/10/2018  . GI bleed 01/09/2018  . Acute respiratory failure with hypoxia ([place]) 11/12/2017  . Gastritis and gastroduodenitis   . Acute gastric ulcer without hemorrhage or perforation   . Duodenal ulcer   . Paraesophageal hernia   . Upper GI bleeding  11/09/2017  . CKD (chronic kidney disease), stage III ([place]) 11/09/2017  . Normocytic anemia 11/09/2017  . Acute on chronic diastolic CHF (congestive heart failure) ([place]) 11/09/2017  . Melena   . RBBB 07/12/2017  . Pulmonary nodule 02/17/2017  . 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 Malignant neoplasm of left lung ([place]) 02/08/2017  . Elevated PSA 11/29/2016  . Aortic valve stenosis, moderate 12/26/2015  . BPH (benign prostatic hyperplasia) 12/18/2015  . Hypertension 12/18/2015  . 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 Heart murmur 11/04/2015  . History of active rheumatic fever 11/04/2015  . Solitary pulmonary nodule 08/31/2013  . 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 Thyroid nodule 08/31/2013  . Hyperlipidemia 04/25/2013  . Essential hypertension, benign 06/08/2012    Past Surgical History:  Procedure Laterality Date  . BIOPSY  11/11/2017   Procedure: BIOPSY;  Surgeon: Lavena Bullion, DO;  Location: [facility];  Service: Gastroenterology;;  . BIOPSY  01/10/2018   Procedure: BIOPSY;  Surgeon: [name], MD;  Location: [facility];  Service: Gastroenterology;;  . cataract eye surgery Right   . COLONOSCOPY  11/2009   hx polyps/[place]  . ESOPHAGOGASTRODUODENOSCOPY N/A 11/11/2017   Procedure: ESOPHAGOGASTRODUODENOSCOPY (EGD);  Surgeon: Lavena Bullion, DO;  Location: [facility];  Service: Gastroenterology;  Laterality: N/A;  . ESOPHAGOGASTRODUODENOSCOPY (EGD) WITH PROPOFOL  N/A 01/10/2018   Procedure: ESOPHAGOGASTRODUODENOSCOPY (EGD) WITH PROPOFOL;  Surgeon: [name], MD;  Location: [facility];  Service: Gastroenterology;  Laterality: N/A;  . IR FLUORO GUIDE CV LINE RIGHT  01/16/2018  . IR FLUORO GUIDE CV LINE RIGHT  01/31/2018  . IR US GUIDE VASC ACCESS RIGHT  01/16/2018  . IR US GUIDE VASC ACCESS RIGHT  01/31/2018  . PROSTATE BIOPSY    . right eye surgery     to lower eye pressure  . RIGHT  HEART CATH N/A 01/19/2018   Procedure: RIGHT HEART CATH;  Surgeon: Nelva Bush, MD;  Location: [facility] CV [facility];  Service: Cardiovascular;  Laterality: N/A;  . TONSILLECTOMY    . VIDEO ASSISTED THORACOSCOPY (VATS)/ LOBECTOMY Right 12/26/2015  . wisdom tteeth ext          Home Medications    Prior to Admission medications   Medication Sig Start Date End Date Taking? Authorizing Provider  Brinzolamide-Brimonidine [facility]) 1-0.2 % SUSP Place 2-3 drops into both eyes 3 (three) times daily.    Yes [provider]  Multiple Vitamin (MULTIVITAMIN) tablet Take 1 tablet by mouth daily. With vitamin D   Yes [provider]  Netarsudil-Latanoprost (ROCKLATAN) 0.02-0.005 % SOLN Place 1 drop into both eyes at bedtime.    Yes Marylynn Pearson, MD  Nutritional Supplements (FEEDING SUPPLEMENT, NEPRO CARB STEADY,) LIQD Take 237 mLs by mouth 3 (three) times daily between meals. 02/09/18  Yes Ghimire, Henreitta Leber, MD  pantoprazole (PROTONIX) 40 MG tablet Take 1 tablet (40 mg total) by mouth 2 (two) times daily. 11/30/17  Yes Willia Craze, NP  potassium chloride SA (K-DUR,KLOR-CON) 20 MEQ tablet Take 2 tablets (40 mEq total) by mouth daily. 02/09/18  Yes Ghimire, Henreitta Leber, MD  SYMBICORT 80-4.5 MCG/ACT inhaler TAKE 2 PUFFS BY MOUTH TWICE A DAY Patient taking differently: Inhale 2 puffs into the lungs as needed (Shortness of breath).  04/06/18  Yes [name], MD  torsemide (DEMADEX) 20 MG tablet Take 60 mg by mouth 2 (two) times daily. 03/09/18  Yes [provider]  XIIDRA 5 % SOLN Place 1 drop into both eyes as needed (Dry eye).  03/27/18  Yes [provider]  NONFORMULARY OR COMPOUNDED ITEM Outpatient PT-please treat and evaluate  Diagnosis: Deconditioning/debility due to prolonged hospitalization, kidney failure, heart failure 02/08/18   Ghimire, Henreitta Leber, MD    Family History Family History  Problem Relation Age of Onset  . Diabetes Mother   . Cerebral  aneurysm Mother   . Lung cancer Father   . Diabetes Sister   . Colon cancer Neg Hx   . Colon polyps Neg Hx   . Esophageal cancer Neg Hx   . Rectal cancer Neg Hx   . Stomach cancer Neg Hx     Social History Social History   Tobacco Use  . Smoking status: Former Smoker    Packs/day: 1.00    Types: Cigarettes    Last attempt to quit: 01/19/2011    Years since quitting: 7.2  . Smokeless tobacco: Never Used  Substance Use Topics  . Alcohol use: Yes    Alcohol/week: 1.0 standard drinks    Types: 1 Shots of liquor per week  . Drug use: No     Allergies   Ambien [zolpidem tartrate] and Atorvastatin   Review of Systems Review of Systems  Constitutional: Negative for chills, fatigue and fever.  Respiratory: Negative for cough and shortness of breath.   Cardiovascular: Negative for  chest pain.  Gastrointestinal: Negative for abdominal pain, constipation, diarrhea, nausea and vomiting.  Musculoskeletal: Negative for back pain, myalgias and neck pain.  Skin: Negative for rash and wound.  Neurological: Negative for dizziness, weakness, light-headedness, numbness and headaches.  All other systems reviewed and are negative.    Physical Exam Updated Vital Signs BP (!) 166/79   Pulse 95   Temp 97.8 F (36.6 C) (Oral)   Resp (!) 23   Ht 5\' 11"  (1.803 m)   Wt 88.5 kg   SpO2 100%   BMI 27.21 kg/m   Physical Exam Vitals signs and nursing note reviewed.  Constitutional:      Appearance: Normal appearance. He is well-developed.  HENT:     Head: Normocephalic and atraumatic.     Nose: Nose normal.  Eyes:     Pupils: Pupils are equal, round, and reactive to light.  Neck:     Musculoskeletal: Normal range of motion and neck supple.  Cardiovascular:     Rate and Rhythm: Normal rate and regular rhythm.     Heart sounds: No murmur. No friction rub. No gallop.   Pulmonary:     Effort: Pulmonary effort is normal. No respiratory distress.     Breath sounds: Normal breath  sounds. No stridor. No wheezing, rhonchi or rales.  Chest:     Chest wall: No tenderness.  Abdominal:     General: Bowel sounds are normal.     Palpations: Abdomen is soft.     Tenderness: There is no abdominal tenderness. There is no guarding or rebound.  Musculoskeletal: Normal range of motion.        General: No swelling, tenderness, deformity or signs of injury.     Right lower leg: No edema.     Left lower leg: No edema.  Skin:    General: Skin is warm and dry.     Capillary Refill: Capillary refill takes less than 2 seconds.     Findings: No erythema or rash.  Neurological:     General: No focal deficit present.     Mental Status: He is alert and oriented to person, place, and time.  Psychiatric:        Mood and Affect: Mood normal.        Behavior: Behavior normal.      ED Treatments / Results  Labs (all labs ordered are listed, but only abnormal results are displayed) Labs Reviewed  CBC WITH DIFFERENTIAL/PLATELET - Abnormal; Notable for the following components:      Result Value   RBC 2.49 (*)    Hemoglobin 5.7 (*)    HCT 19.6 (*)    MCV 78.7 (*)    MCH 22.9 (*)    MCHC 29.1 (*)    RDW 21.1 (*)    Neutro Abs 7.8 (*)    nRBC 1 (*)    All other components within normal limits  COMPREHENSIVE METABOLIC PANEL - Abnormal; Notable for the following components:   Glucose, Bld 106 (*)    BUN 95 (*)    Creatinine, Ser 2.63 (*)    Calcium 8.8 (*)    Total Protein 5.6 (*)    Albumin 2.1 (*)    GFR calc non Af Amer 22 (*)    GFR calc Af Amer 26 (*)    All other components within normal limits  HEMOGLOBIN AND HEMATOCRIT, BLOOD - Abnormal; Notable for the following components:   Hemoglobin 7.1 (*)    HCT 23.8 (*)  All other components within normal limits  POC OCCULT BLOOD, ED  PREPARE RBC (CROSSMATCH)  TYPE AND SCREEN    EKG EKG Interpretation  Date/Time:  Monday May 01 2018 11:29:08 EDT Ventricular Rate:  89 PR Interval:    QRS Duration: 167 QT  Interval:  416 QTC Calculation: 507 R Axis:   81 Text Interpretation:  Sinus rhythm Right bundle branch block Baseline wander in lead(s) V4 V5 Confirmed by Julianne Rice [phone]) on 05/01/2018 12:01:22 PM   Radiology No results found.  Procedures Procedures (including critical care time)  Medications Ordered in ED Medications  0.9 %  sodium chloride infusion (0 mL/hr Intravenous Stopped 05/01/18 1736)     Initial Impression / Assessment and Plan / ED Course  I have reviewed the triage vital signs and the nursing notes.  Pertinent labs & imaging results that were available during my care of the patient were reviewed by me and considered in my medical decision making (see chart for details).       No evidence of active GI bleed.  Patient is hemodynamically stable and states he is at his baseline.  Received 1 unit of packed red blood cells.  Will repeat hemoglobin and if below 7 will transfuse second unit.  Signed out to Dr. Vanita Panda.   Final Clinical Impressions(s) / ED Diagnoses   Final diagnoses:  Anemia, unspecified type    ED Discharge Orders    None       Julianne Rice, MD 05/02/18 [phone]

## 2018-05-01 NOTE — ED Provider Notes (Signed)
Patient is awake, alert, hemodynamically stable, states that he feels good.  With repeat hemoglobin value greater than 7 he will be discharged with close outpatient follow-up.   Carmin Muskrat, MD 05/01/18 1731

## 2018-05-01 NOTE — Progress Notes (Signed)
hgb today 5.4.  Pt denies sob and chest pain, stated he has seen dark stools but that is not abnormal for him as he has a history of that.  Reported this to [employer] who stated Dr Posey Pronto requested 2 units PRBC be given.  I let Amber know our standard of practice at [facility] day[facility] is we do not transfuse patients with a hemoblobin below 6, as well as I made her aware that all blood transfusions have to be approved through the blood bank prior to transfusion.  Amber to explain that to [place] and will call us back with further instructions. Pt updated

## 2018-05-01 NOTE — ED Notes (Signed)
Blood bank reports the are unable to give pt 2nd unit of blood without a repeat Hgb. Hgb will be checked at 1640. Pt updated on process and delay. Pt given sprite

## 2018-05-01 NOTE — ED Notes (Signed)
Pt given sprite, offered meal tray but pt declined

## 2018-05-01 NOTE — Progress Notes (Signed)
Orders received from Dr Posey Pronto to take pt to the ER for evaluation.  Amber also stated transfusion was cleared with the blood bank.

## 2018-05-02 LAB — TYPE AND SCREEN
ABO/RH(D): B POS
Antibody Screen: NEGATIVE
Unit division: 0

## 2018-05-02 LAB — BPAM RBC
Blood Product Expiration Date: 202005012359
ISSUE DATE / TIME: 202004131257
Unit Type and Rh: 7300

## 2018-05-03 ENCOUNTER — Encounter: Payer: Self-pay | Admitting: Family Medicine

## 2018-05-08 ENCOUNTER — Other Ambulatory Visit: Payer: Self-pay

## 2018-05-08 ENCOUNTER — Encounter: Payer: Self-pay | Admitting: Family Medicine

## 2018-05-08 ENCOUNTER — Encounter (HOSPITAL_COMMUNITY): Payer: Medicare Other

## 2018-05-08 ENCOUNTER — Ambulatory Visit (HOSPITAL_COMMUNITY)
Admission: RE | Admit: 2018-05-08 | Discharge: 2018-05-08 | Disposition: A | Discharge status: Home / Self Care | Payer: Medicare Other | Source: Ambulatory Visit | Attending: Nephrology | Admitting: Nephrology

## 2018-05-08 VITALS — BP 156/82 | HR 94 | Temp 98.2°F | Resp 20

## 2018-05-08 DIAGNOSIS — D631 Anemia in chronic kidney disease: Secondary | ICD-10-CM | POA: Diagnosis not present

## 2018-05-08 DIAGNOSIS — N184 Chronic kidney disease, stage 4 (severe): Secondary | ICD-10-CM | POA: Diagnosis present

## 2018-05-08 DIAGNOSIS — D508 Other iron deficiency anemias: Secondary | ICD-10-CM

## 2018-05-08 LAB — POCT HEMOGLOBIN-HEMACUE: Hemoglobin: 6.4 g/dL — CL (ref 13.0–17.0)

## 2018-05-08 MED ORDER — EPOETIN ALFA-EPBX 10000 UNIT/ML IJ SOLN
40000.0000 [IU] | Freq: Once | INTRAMUSCULAR | Status: AC
  Administered 2018-05-08: 40000 [IU] via SUBCUTANEOUS
  Filled 2018-05-08: qty 4

## 2018-05-08 MED ORDER — EPOETIN ALFA-EPBX 10000 UNIT/ML IJ SOLN
30000.0000 [IU] | Freq: Once | INTRAMUSCULAR | Status: DC
  Filled 2018-05-08: qty 3

## 2018-05-08 MED ORDER — EPOETIN ALFA-EPBX 40000 UNIT/ML IJ SOLN: 40000.0000 [IU] | Freq: Once | INTRAMUSCULAR | Status: DC

## 2018-05-08 MED ORDER — EPOETIN ALFA-EPBX 40000 UNIT/ML IJ SOLN: 30000.0000 [IU] | INTRAMUSCULAR | Status: DC

## 2018-05-08 NOTE — Progress Notes (Signed)
Hemocue 6.4 today, was 7.1 last week after being given a unit of blood.  Orders received from dr patel, per Luetta Nutting, at [facility] to increase retacrit to 40,000 units weekly and tell patient to call his primary care to get a referral for a GI dr.  Pt verbalized understanding.

## 2018-05-09 ENCOUNTER — Telehealth: Payer: Self-pay | Admitting: Family Medicine

## 2018-05-09 ENCOUNTER — Other Ambulatory Visit: Payer: Self-pay

## 2018-05-09 ENCOUNTER — Ambulatory Visit: Payer: Medicare Other | Admitting: Family Medicine

## 2018-05-09 NOTE — Telephone Encounter (Signed)
Through my chart message family states that they will be following through with Dr. Posey Pronto and did not need 1 PM visit with [name] today thank you  May cancel 1 PM visit today

## 2018-05-09 NOTE — Telephone Encounter (Signed)
cancelled

## 2018-05-15 ENCOUNTER — Emergency Department (HOSPITAL_COMMUNITY)
Admission: EM | Admit: 2018-05-15 | Discharge: 2018-05-15 | Disposition: A | Discharge status: Home / Self Care | Payer: Medicare Other | Attending: Emergency Medicine | Admitting: Emergency Medicine

## 2018-05-15 ENCOUNTER — Encounter (HOSPITAL_COMMUNITY): Payer: Self-pay

## 2018-05-15 ENCOUNTER — Other Ambulatory Visit: Payer: Self-pay

## 2018-05-15 ENCOUNTER — Ambulatory Visit (HOSPITAL_COMMUNITY)
Admission: RE | Admit: 2018-05-15 | Discharge: 2018-05-15 | Disposition: A | Discharge status: Home / Self Care | Payer: Medicare Other | Source: Ambulatory Visit | Attending: Nephrology | Admitting: Nephrology

## 2018-05-15 ENCOUNTER — Telehealth: Payer: Self-pay | Admitting: Family Medicine

## 2018-05-15 VITALS — BP 138/66 | HR 93 | Temp 98.0°F | Resp 20 | Ht 71.0 in | Wt 205.0 lb

## 2018-05-15 DIAGNOSIS — Z8719 Personal history of other diseases of the digestive system: Secondary | ICD-10-CM | POA: Insufficient documentation

## 2018-05-15 DIAGNOSIS — Z87891 Personal history of nicotine dependence: Secondary | ICD-10-CM | POA: Insufficient documentation

## 2018-05-15 DIAGNOSIS — D62 Acute posthemorrhagic anemia: Secondary | ICD-10-CM | POA: Diagnosis not present

## 2018-05-15 DIAGNOSIS — I5032 Chronic diastolic (congestive) heart failure: Secondary | ICD-10-CM | POA: Diagnosis not present

## 2018-05-15 DIAGNOSIS — Z79899 Other long term (current) drug therapy: Secondary | ICD-10-CM | POA: Insufficient documentation

## 2018-05-15 DIAGNOSIS — I130 Hypertensive heart and chronic kidney disease with heart failure and stage 1 through stage 4 chronic kidney disease, or unspecified chronic kidney disease: Secondary | ICD-10-CM | POA: Diagnosis not present

## 2018-05-15 DIAGNOSIS — D508 Other iron deficiency anemias: Secondary | ICD-10-CM | POA: Insufficient documentation

## 2018-05-15 DIAGNOSIS — D649 Anemia, unspecified: Secondary | ICD-10-CM | POA: Diagnosis present

## 2018-05-15 DIAGNOSIS — N183 Chronic kidney disease, stage 3 (moderate): Secondary | ICD-10-CM | POA: Insufficient documentation

## 2018-05-15 LAB — BASIC METABOLIC PANEL
Anion gap: 10 (ref 5–15)
BUN: 70 mg/dL — ABNORMAL HIGH (ref 8–23)
CO2: 25 mmol/L (ref 22–32)
Calcium: 8.4 mg/dL — ABNORMAL LOW (ref 8.9–10.3)
Chloride: 105 mmol/L (ref 98–111)
Creatinine, Ser: 2.63 mg/dL — ABNORMAL HIGH (ref 0.61–1.24)
GFR calc Af Amer: 26 mL/min — ABNORMAL LOW (ref >60)
GFR calc non Af Amer: 22 mL/min — ABNORMAL LOW (ref >60)
Glucose, Bld: 117 mg/dL — ABNORMAL HIGH (ref 70–99)
Potassium: 4.1 mmol/L (ref 3.5–5.1)
Sodium: 140 mmol/L (ref 135–145)

## 2018-05-15 LAB — CBC WITH DIFFERENTIAL/PLATELET
Abs Immature Granulocytes: 0.04 10*3/uL (ref 0.00–0.07)
Basophils Absolute: 0 10*3/uL (ref 0.0–0.1)
Basophils Relative: 0 %
Eosinophils Absolute: 0.3 10*3/uL (ref 0.0–0.5)
Eosinophils Relative: 3 %
HCT: 20.1 % — ABNORMAL LOW (ref 39.0–52.0)
Hemoglobin: 6 g/dL — CL (ref 13.0–17.0)
Immature Granulocytes: 0 %
Lymphocytes Relative: 15 %
Lymphs Abs: 1.4 10*3/uL (ref 0.7–4.0)
MCH: 23 pg — ABNORMAL LOW (ref 26.0–34.0)
MCHC: 29.9 g/dL — ABNORMAL LOW (ref 30.0–36.0)
MCV: 77 fL — ABNORMAL LOW (ref 80.0–100.0)
Monocytes Absolute: 0.8 10*3/uL (ref 0.1–1.0)
Monocytes Relative: 8 %
Neutro Abs: 6.9 10*3/uL (ref 1.7–7.7)
Neutrophils Relative %: 74 %
Platelets: 232 10*3/uL (ref 150–400)
RBC: 2.61 MIL/uL — ABNORMAL LOW (ref 4.22–5.81)
RDW: 21.9 % — ABNORMAL HIGH (ref 11.5–15.5)
WBC: 9.4 10*3/uL (ref 4.0–10.5)
nRBC: 0.4 % — ABNORMAL HIGH (ref 0.0–0.2)

## 2018-05-15 LAB — CBC
HCT: 28.2 % — ABNORMAL LOW (ref 39.0–52.0)
Hemoglobin: 8.8 g/dL — ABNORMAL LOW (ref 13.0–17.0)
MCH: 24.4 pg — ABNORMAL LOW (ref 26.0–34.0)
MCHC: 31.2 g/dL (ref 30.0–36.0)
MCV: 78.3 fL — ABNORMAL LOW (ref 80.0–100.0)
Platelets: 208 10*3/uL (ref 150–400)
RBC: 3.6 MIL/uL — ABNORMAL LOW (ref 4.22–5.81)
RDW: 20.3 % — ABNORMAL HIGH (ref 11.5–15.5)
WBC: 10.2 10*3/uL (ref 4.0–10.5)
nRBC: 0.3 % — ABNORMAL HIGH (ref 0.0–0.2)

## 2018-05-15 LAB — PROTIME-INR
INR: 1.1 (ref 0.8–1.2)
Prothrombin Time: 14.3 seconds (ref 11.4–15.2)

## 2018-05-15 LAB — POCT HEMOGLOBIN-HEMACUE: Hemoglobin: 5.5 g/dL — CL (ref 13.0–17.0)

## 2018-05-15 LAB — POC OCCULT BLOOD, ED: Fecal Occult Bld: NEGATIVE

## 2018-05-15 LAB — PREPARE RBC (CROSSMATCH)

## 2018-05-15 MED ORDER — SODIUM CHLORIDE 0.9% IV SOLUTION
Freq: Once | INTRAVENOUS | Status: AC
  Administered 2018-05-15: 10 mL/h via INTRAVENOUS

## 2018-05-15 MED ORDER — EPOETIN ALFA-EPBX 40000 UNIT/ML IJ SOLN: 40000.0000 [IU] | INTRAMUSCULAR | Status: DC

## 2018-05-15 MED ORDER — SODIUM CHLORIDE 0.9 % IV SOLN
510.0000 mg | INTRAVENOUS | Status: DC
  Administered 2018-05-15: 510 mg via INTRAVENOUS
  Filled 2018-05-15: qty 17

## 2018-05-15 MED ORDER — EPOETIN ALFA-EPBX 10000 UNIT/ML IJ SOLN
40000.0000 [IU] | Freq: Once | INTRAMUSCULAR | Status: AC
  Administered 2018-05-15: 40000 [IU] via SUBCUTANEOUS
  Filled 2018-05-15: qty 4

## 2018-05-15 NOTE — ED Provider Notes (Signed)
[facility] EMERGENCY DEPARTMENT Provider Note   CSN: [id number] Arrival date & time: 05/15/18  0920    History   Chief Complaint Chief Complaint  Patient presents with  . GI Bleeding    HPI Andrew Guerra is a 78 y.o. male w PMHx GI bleed 2/t bleeding ulcers, HTN, aortic stenosis, PAF, CKD stage 3, presenting to the ED with complaint of anemia 2/t GI bleeding. Pt is seen weekly at the Day Care clinic for iron infusions. Today his hemoglobin was noted to be 5.5, with a positive hemoccult on Friday. Pt followed by nephrologist Dr. Posey Pronto , who recommended he report to the ED today after he received iron transfusion.  Patient has had intermittent episodes of GI bleeding secondary to duodenal and gastric ulcers (per EGD done in January) for the past year he says.  He states little more than a week ago he had a little bit of melena, however has had none since.  He was seen in the ED on 05/01/2018 with anemia as well, he was transfused and discharged after improvement in his hemoglobin.  During that visit he had a negative Hemoccult.  He is currently asymptomatic.  He denies abdominal pain, nausea, vomiting, chest pain, shortness of breath that is worse from baseline, fatigue, lightheadedness, weakness.  He states he feels as though he could have managed this outpatient with his GI specialist at [facility] and his nephrologist, however reported here per Dr. Serita Grit recommendations.     The history is provided by the patient and medical records.    Past Medical History:  Diagnosis Date  . 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  [name] Cataract    right eye - surgery to remove  . Elevated PSA 11/29/2016   [facility] is [facility].  Most recent PSA near 11.  He has had atypia on biopsy.  November 2008  . Essential hypertension, benign 06/08/2012  .  Full dentures   . Glaucoma   . Heart murmur    since childhood, never has caused any problems  . History of rheumatic fever 11/04/2015  . Hyperlipidemia 04/25/2013  . Hypertension   . Malignant neoplasm of left lung ([place]) 02/08/2017  . RBBB 07/12/2017  . Sickle cell trait ([place])    no problems per patient    Patient Active Problem List   Diagnosis Date Noted  . Acute renal failure ([place])   . PAF (paroxysmal atrial fibrillation) ([place])   . Pressure injury of skin 01/17/2018  . Intestinal metaplasia of gastric mucosa   . Iron deficiency anemia   . Anasarca   . Malnutrition of moderate degree 01/10/2018  . GI bleed 01/09/2018  . Acute respiratory failure with hypoxia ([place]) 11/12/2017  . Gastritis and gastroduodenitis   . Acute gastric ulcer without hemorrhage or perforation   . Duodenal ulcer   . Paraesophageal hernia   . Upper GI bleeding 11/09/2017  . CKD (chronic kidney disease), stage III ([place]) 11/09/2017  . Normocytic anemia 11/09/2017  . Acute on chronic diastolic CHF (congestive heart failure) ([place]) 11/09/2017  . Melena   . RBBB 07/12/2017  . Pulmonary nodule 02/17/2017  . 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 Malignant neoplasm of left lung ([place]) 02/08/2017  . Elevated PSA 11/29/2016  . Aortic valve stenosis, moderate 12/26/2015  . BPH (benign prostatic hyperplasia) 12/18/2015  .  Hypertension 12/18/2015  . 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 Heart murmur 11/04/2015  . History of active rheumatic fever 11/04/2015  . Solitary pulmonary nodule 08/31/2013  . 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 Thyroid nodule 08/31/2013  . Hyperlipidemia 04/25/2013  . Essential hypertension, benign 06/08/2012    Past Surgical History:  Procedure Laterality Date  . BIOPSY  11/11/2017   Procedure: BIOPSY;  Surgeon: Lavena Bullion, DO;  Location: [facility];  Service: Gastroenterology;;  . BIOPSY  01/10/2018   Procedure:  BIOPSY;  Surgeon: [name], MD;  Location: [facility];  Service: Gastroenterology;;  . cataract eye surgery Right   . COLONOSCOPY  11/2009   hx polyps/[place]  . ESOPHAGOGASTRODUODENOSCOPY N/A 11/11/2017   Procedure: ESOPHAGOGASTRODUODENOSCOPY (EGD);  Surgeon: Lavena Bullion, DO;  Location: [facility] ENDOSCOPY;  Service: Gastroenterology;  Laterality: N/A;  . ESOPHAGOGASTRODUODENOSCOPY (EGD) WITH PROPOFOL N/A 01/10/2018   Procedure: ESOPHAGOGASTRODUODENOSCOPY (EGD) WITH PROPOFOL;  Surgeon: [name], MD;  Location: [facility];  Service: Gastroenterology;  Laterality: N/A;  . IR FLUORO GUIDE CV LINE RIGHT  01/16/2018  . IR FLUORO GUIDE CV LINE RIGHT  01/31/2018  . IR US GUIDE VASC ACCESS RIGHT  01/16/2018  . IR US GUIDE VASC ACCESS RIGHT  01/31/2018  . PROSTATE BIOPSY    . right eye surgery     to lower eye pressure  . RIGHT HEART CATH N/A 01/19/2018   Procedure: RIGHT HEART CATH;  Surgeon: Nelva Bush, MD;  Location: [facility] CV LAB;  Service: Cardiovascular;  Laterality: N/A;  . TONSILLECTOMY    . VIDEO ASSISTED THORACOSCOPY (VATS)/ LOBECTOMY Right 12/26/2015  . wisdom tteeth ext          Home Medications    Prior to Admission medications   Medication Sig Start Date End Date Taking? Authorizing Provider  Brinzolamide-Brimonidine [facility]) 1-0.2 % SUSP Place 2-3 drops into both eyes 3 (three) times daily.    Yes [provider]  cholecalciferol (VITAMIN D3) 25 MCG (1000 UT) tablet Take 1,000 Units by mouth daily.   Yes [provider]  Multiple Vitamin (MULTIVITAMIN) tablet Take 1 tablet by mouth daily. With vitamin D   Yes [provider]  Netarsudil-Latanoprost (ROCKLATAN) 0.02-0.005 % SOLN Place 1 drop into both eyes at bedtime.    Yes Marylynn Pearson, MD  Nutritional Supplements (FEEDING SUPPLEMENT, NEPRO CARB STEADY,) LIQD Take 237 mLs by mouth 3 (three) times daily between meals. 02/09/18  Yes Ghimire, Henreitta Leber, MD  pantoprazole  (PROTONIX) 40 MG tablet Take 1 tablet (40 mg total) by mouth 2 (two) times daily. 11/30/17  Yes Willia Craze, NP  potassium chloride SA (K-DUR,KLOR-CON) 20 MEQ tablet Take 2 tablets (40 mEq total) by mouth daily. 02/09/18  Yes Ghimire, Henreitta Leber, MD  SYMBICORT 80-4.5 MCG/ACT inhaler TAKE 2 PUFFS BY MOUTH TWICE A DAY Patient taking differently: Inhale 2 puffs into the lungs as needed (Shortness of breath).  04/06/18  Yes [name], MD  torsemide (DEMADEX) 20 MG tablet Take 60 mg by mouth 2 (two) times daily. 03/09/18  Yes [provider]  XIIDRA 5 % SOLN Place 1 drop into both eyes as needed (Dry eye).  03/27/18  Yes [provider]  NONFORMULARY OR COMPOUNDED ITEM Outpatient PT-please treat and evaluate  Diagnosis: Deconditioning/debility due to prolonged hospitalization, kidney failure, heart failure 02/08/18   Ghimire, Henreitta Leber, MD    Family History Family History  Problem Relation Age of Onset  .  Diabetes Mother   . Cerebral aneurysm Mother   . Lung cancer Father   . Diabetes Sister   . Colon cancer Neg Hx   . Colon polyps Neg Hx   . Esophageal cancer Neg Hx   . Rectal cancer Neg Hx   . Stomach cancer Neg Hx     Social History Social History   Tobacco Use  . Smoking status: Former Smoker    Packs/day: 1.00    Types: Cigarettes    Last attempt to quit: 01/19/2011    Years since quitting: 7.3  . Smokeless tobacco: Never Used  Substance Use Topics  . Alcohol use: Yes    Alcohol/week: 1.0 standard drinks    Types: 1 Shots of liquor per week  . Drug use: No     Allergies   Ambien [zolpidem tartrate] and Atorvastatin   Review of Systems Review of Systems  All other systems reviewed and are negative.    Physical Exam Updated Vital Signs BP (!) 173/84   Pulse 86   Temp 97.6 F (36.4 C) (Oral)   Resp (!) 21   Ht 5\' 11"  (1.803 m)   Wt 93 kg   SpO2 100%   BMI 28.60 kg/m   Physical Exam Vitals signs and nursing note reviewed.   Constitutional:      General: He is not in acute distress.    Appearance: He is well-developed. He is not ill-appearing.  HENT:     Head: Normocephalic and atraumatic.  Eyes:     Conjunctiva/sclera: Conjunctivae normal.  Cardiovascular:     Rate and Rhythm: Normal rate and regular rhythm.  Pulmonary:     Effort: Pulmonary effort is normal.     Breath sounds: Normal breath sounds.  Abdominal:     General: Bowel sounds are normal. There is no distension.     Palpations: Abdomen is soft.     Tenderness: There is no abdominal tenderness. There is no guarding or rebound.  Skin:    General: Skin is warm.  Neurological:     Mental Status: He is alert.  Psychiatric:        Behavior: Behavior normal.      ED Treatments / Results  Labs (all labs ordered are listed, but only abnormal results are displayed) Labs Reviewed  BASIC METABOLIC PANEL - Abnormal; Notable for the following components:      Result Value   Glucose, Bld 117 (*)    BUN 70 (*)    Creatinine, Ser 2.63 (*)    Calcium 8.4 (*)    GFR calc non Af Amer 22 (*)    GFR calc Af Amer 26 (*)    All other components within normal limits  CBC WITH DIFFERENTIAL/PLATELET - Abnormal; Notable for the following components:   RBC 2.61 (*)    Hemoglobin 6.0 (*)    HCT 20.1 (*)    MCV 77.0 (*)    MCH 23.0 (*)    MCHC 29.9 (*)    RDW 21.9 (*)    nRBC 0.4 (*)    All other components within normal limits  PROTIME-INR  POC OCCULT BLOOD, ED  TYPE AND SCREEN  PREPARE RBC (CROSSMATCH)    EKG EKG Interpretation  Date/Time:  Monday May 15 2018 11:47:58 EDT Ventricular Rate:  88 PR Interval:    QRS Duration: 172 QT Interval:  428 QTC Calculation: 518 R Axis:   -53 Text Interpretation:  Sinus rhythm Right bundle branch block No STEMI Confirmed by  Nanda Quinton ([id number]) on 05/15/2018 1:18:42 PM   Radiology No results found.  Procedures Procedures (including critical care time)  Medications Ordered in ED Medications   0.9 %  sodium chloride infusion (Manually program via Guardrails IV Fluids) (10 mL/hr Intravenous New Bag/Given 05/15/18 1225)     Initial Impression / Assessment and Plan / ED Course  I have reviewed the triage vital signs and the nursing notes.  Pertinent labs & imaging results that were available during my care of the patient were reviewed by me and considered in my medical decision making (see chart for details).  Clinical Course as of May 15 1526  Mon May 15, 2018  1306 Patient discussed with Dr. Joelyn Oms with nephrology, partner Dr. Posey Pronto.  He agrees with plan to transfuse and recheck hemoglobin level.  If above 7, and before discharge.  Patient has close GI follow-up with Dr. Velora Heckler.   [JR]    Clinical Course User Index [JR] Ravinder Lukehart, [place] N, PA-C       Patient with history of intermittent upper GI bleed with acute blood loss anemia, presenting to the emergency department with anemia.  Patient was noted to have a hemoglobin of 5.5 at the day care clinic today.  He was given iron transfusion prior to arrival.  Last seen on April 13 with 1 unit of RBC transfusion in the ED and discharged after improvement.  Today, patient is asymptomatic without melena or hematochezia, or lightheadedness/fatigue.  Hemoglobin here is 6.0, hematocrit 20.1.  No leukocytosis.  Creatinine appears to be stable for patient's baseline.  Fecal occult is negative today.  Vital signs are stable.  Patient was discussed with Dr. Carmina Miller with nephrology.  He agrees with plan to transfuse in the ED and recheck hemoglobin as patient has not currently had a positive hemoglobin without symptoms of active GI bleed.  Patient is agreeable to this plan.  At this time patient has received 1 unit of packed red blood cells and remains asymptomatic.  Pending transfusion of second unit, care assumed by PA Ford.  Plan to recheck H&H for appropriate disposition.  If hemoglobin is above 7, patient recommended for discharge with  close outpatient follow-up.  If hemoglobin without significant improvement, recommend admission for further management.  Final Clinical Impressions(s) / ED Diagnoses   Final diagnoses:  Acute blood loss anemia  H/O: upper GI bleed    ED Discharge Orders    None       Demaris Bousquet, [place] N, PA-C 05/15/18 1528    Margette Fast, MD 05/15/18 2029

## 2018-05-15 NOTE — ED Notes (Signed)
Abnormal labs noted to PA

## 2018-05-15 NOTE — Discharge Instructions (Addendum)
Hemoglobin has improved to 8 with blood here today.  Please follow-up outpatient with your regular doctors.  Think you are likely having intermittent bleeding from your upper GI tract.  Return if you have abdominal pain, bloody vomit or bloody stools, feel lightheaded or like you may pass out have chest pain, shortness of breath or any other new or concerning symptoms.

## 2018-05-15 NOTE — ED Notes (Signed)
Pt given sprite and graham crackers.

## 2018-05-15 NOTE — Telephone Encounter (Signed)
Pt's wife called stating pt was told to come in today for an infusion and when he got there they said he hemoglobin is to low and they have asked that his PCP contact Maryanna Shape on his bleeding ulcer. That is what the wife has been told by pt's kidney doc.   Pt is currently in the ER at [facility]

## 2018-05-15 NOTE — ED Provider Notes (Signed)
Care assumed from [place] Robinson, please see her note for full details, but in brief Andrew Guerra is a 78 y.o. male with history of recent GI bleed, who was sent in today by Dr. Posey Pronto after he was noted to have a hemoglobin of 5.5 with a positive Hemoccult on Friday.  He received iron transfusion today.  He has had episodes of intermittent upper GI bleeding due to duodenal and gastric ulcers which were found in January.  He reports he has had some intermittent episodes of melena, most recently about a week ago.  Was recently seen in ED, transfused and discharged.  On arrival hemoglobin of 6.0 but negative Hemoccult labs otherwise at baseline.  Care was discussed with patient's nephrology team who recommended 2 unit transfusion and as long as hemoglobin is greater than 7 patient could likely be discharged as he very specifically does not want to be admitted today.  Reassured that Hemoccult is currently negative.  Plan: Patient is asymptomatic with anemia, will transfuse 2 units and recheck hemoglobin, if greater than 7 can be discharged home with close outpatient follow-up.  Physical Exam  BP (!) 176/81   Pulse 92   Temp (!) 97.3 F (36.3 C) (Oral)   Resp 20   Ht 5\' 11"  (1.803 m)   Wt 93 kg   SpO2 98%   BMI 28.60 kg/m   Physical Exam Vitals signs and nursing note reviewed.  Constitutional:      General: He is not in acute distress.    Appearance: Normal appearance. He is well-developed. He is not diaphoretic.  HENT:     Head: Normocephalic and atraumatic.  Eyes:     General:        Right eye: No discharge.        Left eye: No discharge.  Pulmonary:     Effort: Pulmonary effort is normal. No respiratory distress.  Neurological:     Mental Status: He is alert.     Coordination: Coordination normal.  Psychiatric:        Mood and Affect: Mood normal.        Behavior: Behavior normal.     ED Course/Procedures   Labs Reviewed  BASIC METABOLIC PANEL - Abnormal; Notable for the  following components:      Result Value   Glucose, Bld 117 (*)    BUN 70 (*)    Creatinine, Ser 2.63 (*)    Calcium 8.4 (*)    GFR calc non Af Amer 22 (*)    GFR calc Af Amer 26 (*)    All other components within normal limits  CBC WITH DIFFERENTIAL/PLATELET - Abnormal; Notable for the following components:   RBC 2.61 (*)    Hemoglobin 6.0 (*)    HCT 20.1 (*)    MCV 77.0 (*)    MCH 23.0 (*)    MCHC 29.9 (*)    RDW 21.9 (*)    nRBC 0.4 (*)    All other components within normal limits  CBC - Abnormal; Notable for the following components:   RBC 3.60 (*)    Hemoglobin 8.8 (*)    HCT 28.2 (*)    MCV 78.3 (*)    MCH 24.4 (*)    RDW 20.3 (*)    nRBC 0.3 (*)    All other components within normal limits  PROTIME-INR  POC OCCULT BLOOD, ED  TYPE AND SCREEN  PREPARE RBC (CROSSMATCH)   Medications  0.9 %  sodium chloride  infusion (Manually program via Guardrails IV Fluids) ( Intravenous Stopped 05/15/18 1549)   Procedures  MDM   After patient received 2 units PRBCs hemoglobin has improved to 8.8 patient remains asymptomatic with no episodes of melena or bright red blood per rectum since coming here to the emergency department.  Per previous plan at this time patient is stable for discharge home with close outpatient follow-up.      Jacqlyn Larsen, PA-C 05/15/18 2131    Margette Fast, MD 05/16/18 [phone]

## 2018-05-15 NOTE — ED Triage Notes (Signed)
Pt arrives from Day Care with c/o blood in stool. Now states low blood count and sent by MD. Denies symptoms.

## 2018-05-15 NOTE — ED Notes (Signed)
Discharge instructions discussed with Pt. Pt verbalized understanding. Pt stable and leaving via WC.    

## 2018-05-15 NOTE — Progress Notes (Signed)
Hemocue today 5.5.  PT denies shortness of breath and chest pain.  Pt says he feels no different then he has been.  Pt also stated Dr Posey Pronto had him do a hemoccult stool through [facility] and they called him from the office Friday and told him the result was positive and ordered the IV iron to be given today in medical daycare.  Called [facility] and reported the above to [place].  Dr Posey Pronto stated to send pt to the ER for a workup for possible GI bleed and transfusion.  Pt receiving IV feraheme now so when infusion complete will take pt to the ER.

## 2018-05-15 NOTE — Telephone Encounter (Signed)
Please advise. Thank you

## 2018-05-15 NOTE — ED Notes (Signed)
RN remains at bedside

## 2018-05-16 ENCOUNTER — Telehealth: Payer: Self-pay

## 2018-05-16 ENCOUNTER — Telehealth: Payer: Self-pay | Admitting: Family Medicine

## 2018-05-16 LAB — BPAM RBC
Blood Product Expiration Date: 202005022359
Blood Product Expiration Date: 202005062359
ISSUE DATE / TIME: 202004271218
ISSUE DATE / TIME: 202004271542
Unit Type and Rh: 7300
Unit Type and Rh: 7300

## 2018-05-16 LAB — TYPE AND SCREEN
ABO/RH(D): B POS
Antibody Screen: NEGATIVE
Unit division: 0
Unit division: 0

## 2018-05-16 NOTE — Telephone Encounter (Signed)
Contacted patient and informed patient that provider spoke with [name] MD; GI and they would be giving him a call to set up virtual appt. Pt verbalized understanding

## 2018-05-16 NOTE — Telephone Encounter (Signed)
Please let the family know that I was aware that the patient went to the ER.  I was able to follow course of his treatment via online.  I have reached out to gastroenterology regarding his situation and have not heard back yet.  As soon as I hear something we will communicate with them.

## 2018-05-16 NOTE — Telephone Encounter (Signed)
Tried to called patient; line busy

## 2018-05-16 NOTE — Telephone Encounter (Signed)
Nurses please see other telephone message regarding Andrew Guerra  I did communicate with [name] MD gastroenterologist-she saw him while he was in the hospital previously Their office will be reaching out to the patient to help schedule a virtual visit to discuss his anemia further and what they would recommend

## 2018-05-16 NOTE — Telephone Encounter (Signed)
-----   Message from [name], MD sent at 05/16/2018  9:45 AM EDT ----- Regarding: FW: Anemia Please schedule telehealth visit with [facility] MD.   Thanks.  KLB ----- Message ----- From: [name], MD Sent: 05/16/2018   9:32 AM EDT To: [name], MD Subject: Anemia                                         Hi Maudie Mercury  Dhillon Comunale is a mutual patient. He does have advanced chronic kidney disease. He was in the hospital a few months ago with a severe anemia and GI bleed.  You consulted on him while he was in the hospital and did a EGD.  Recently he has had a reoccurring significant anemia that have required transfusion a few separate times through the ER.  His nephrologist-Dr. Patel-recommended that he do a follow-up with gastroenterology regarding possibility of GI blood loss contributing to this.  We will be sending a referral-but I am requesting that your office reach out to the patient for a virtual visit in the near future to help address this issue.  Feel free to call or communicate with me if you are having additional questions.  Thanks Sallee Lange primary care    Cell if [phone]

## 2018-05-16 NOTE — Telephone Encounter (Signed)
Pt came home last night. Hemoglobin was at 8 when he left. Pt states no blood found in stool in ER. Went primarily on what Dr.Patel had taken last week and sent it to [facility]. Monday had normal visit for infusion and while there his blood was low, left infusion site and went straight to ER. Pt would like to set up visit with Dr.Scott to discussion what is going on. Pt transferred up front to set up appt.

## 2018-05-16 NOTE — Telephone Encounter (Signed)
No answer/machine I will continue to try and reach the patient  

## 2018-05-17 NOTE — Telephone Encounter (Signed)
Patient notified of the recommendations. He is scheduled for a telephone visit with Dr. Tarri Glenn for Friday 10:30

## 2018-05-18 ENCOUNTER — Ambulatory Visit (INDEPENDENT_AMBULATORY_CARE_PROVIDER_SITE_OTHER): Payer: Medicare Other | Admitting: Family Medicine

## 2018-05-18 ENCOUNTER — Other Ambulatory Visit: Payer: Self-pay

## 2018-05-18 DIAGNOSIS — N183 Chronic kidney disease, stage 3 unspecified: Secondary | ICD-10-CM

## 2018-05-18 DIAGNOSIS — D500 Iron deficiency anemia secondary to blood loss (chronic): Secondary | ICD-10-CM | POA: Diagnosis not present

## 2018-05-18 DIAGNOSIS — I10 Essential (primary) hypertension: Secondary | ICD-10-CM

## 2018-05-18 NOTE — Progress Notes (Signed)
   Subjective:    Patient ID: Andrew Guerra, male    DOB: 12-28-1940, 78 y.o.   MRN: [id number] Video Hypertension  This is a chronic problem. Risk factors for coronary artery disease include male gender. Treatments tried: demadex. There are no compliance problems.     Virtual Visit via Video Note  I connected with Andrew Guerra on 05/18/18 at 10:00 AM EDT by a video enabled telemedicine application and verified that I am speaking with the correct person using two identifiers.  Location: Patient: home Provider: office   I discussed the limitations of evaluation and management by telemedicine and the availability of in person appointments. The patient expressed understanding and agreed to proceed.  History of Present Illness: We had a good discussion regarding how he is doing His breathing overall is doing well currently he is on his oxygen O2 saturation stay in the low 90s  He has not had any respiratory illnesses recently  He does suffer with a reoccurring anemia issues partly related to chronic kidney disease but also partly related to intermittent GI loss he has a virtual visit with gastroenterology on Friday and they will help guide him regarding whether or not he needs a follow-up EGD  Ideally we would like to keep his hemoglobin around 7 or better and ideally like to keep him out of the ER   Observations/Objective:   Assessment and Plan:   Follow Up Instructions:    I discussed the assessment and treatment plan with the patient. The patient was provided an opportunity to ask questions and all were answered. The patient agreed with the plan and demonstrated an understanding of the instructions.   The patient was advised to call back or seek an in-person evaluation if the symptoms worsen or if the condition fails to improve as anticipated.  I provided 15 minutes of non-face-to-face time during this encounter.      Review of Systems     Objective:   Physical Exam         Assessment & Plan:  We will do a follow-up visit with the patient in approximately 1 month's time  Intermittent severe anemia- overall ideally we would like to keep his hemoglobin at a 7 or better.  Also we would like to keep him out of the ER for his health.  He will be seen virtual visit gastroenterology tomorrow for them to discuss whether or not he needs follow-up EGD.  Also they may be able to enlighten the patient if there are abilities to get transfusion as an outpatient without having to go through the ER-given that this is [facility] I am not familiar with their process  COPD with oxygen stable continue current measures  Blood pressure reportedly good

## 2018-05-19 ENCOUNTER — Encounter: Payer: Self-pay | Admitting: Gastroenterology

## 2018-05-19 ENCOUNTER — Other Ambulatory Visit: Payer: Self-pay

## 2018-05-19 ENCOUNTER — Ambulatory Visit (INDEPENDENT_AMBULATORY_CARE_PROVIDER_SITE_OTHER): Payer: Medicare Other | Admitting: Gastroenterology

## 2018-05-19 VITALS — Ht 70.0 in | Wt 200.0 lb

## 2018-05-19 DIAGNOSIS — R195 Other fecal abnormalities: Secondary | ICD-10-CM

## 2018-05-19 DIAGNOSIS — D649 Anemia, unspecified: Secondary | ICD-10-CM

## 2018-05-19 MED ORDER — PANTOPRAZOLE SODIUM 40 MG PO TBEC: 40.0000 mg | DELAYED_RELEASE_TABLET | Freq: Two times a day (BID) | ORAL | 1 refills | Status: DC

## 2018-05-19 NOTE — Progress Notes (Signed)
TELEHEALTH VISIT  Referring Provider: [name], MD Primary Care Physician:  [name], MD   Tele-visit due to COVID-19 pandemic Patient requested visit virtually, consented to the virtual encounter via voice enabled telemedicine application Contact made at: 10:30 05/19/18 Patient verified by name and date of birth Location of patient: Home Location provider: [place] [facility] Names of persons participating: Me, patient, wife (on speaker phone), Tinnie Gens CMA Time spent on telehealth visit: 34 minutes I discussed the limitations of evaluation and management by telemedicine. The patient expressed understanding and agreed to proceed.  Chief complaint:  Progressive anemia   IMPRESSION:  Acute on chronic anemia with guaiac + stools PUD: Gastric and duodenal ulcers    - initially diagnosed as source of bleeding 10/2017     - H pylori + 10/2017       - treated with bismuth, metronidazole, doxycycline, PPI       - subsequent gastric biopsies 01/10/18 negative for H pylori  Intestinal metaplasia    - intestinal metaplasia on biopsies 10/10 and after H pylori irradication       - patient not at high risk for gastric cancer, no family history of stomach cancer       - no surveillance indicated based on AGA guidelines [place] class A reflux esophagitis on EGD 01/10/18 Duodenal nodule seen on EGD 10/2017 ([place])    - Biopsy 10/2017 showed benign mucosa    - No duodenal or pancreatic abnormalities identified on MRI/MRCP planned    - EUS with Dr. 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 Likely multifactorial given his chronic kidney disease, possible hemorraghic renal cysts, and patient reported stool sample + for blood at outside lab. History of tarry stools, although today reports that they have been dark brown and not black. Multiple hemoccult studies in the [facility] system have been negative for GI blood loss.   He had multiple gastric and duodenal ulcers on my EGD 01/10/18. History of a medium duodenal nodule in the second portion of the duodenum seen on prior endoscopy with Dr. Bryan Lemma 11/11/17. MRI/MRCP recommended by Dr. Rush Landmark at that time (prior to EUS). I have personally reviewed the images from his MRI/MRCP and discussed them with the patient. I cannot tell if EUS was further considered, but, it was not scheduled. Wil review with Dr. Rush Landmark to determine next best step.   Will make plans for enteroscopy and colonoscopy for further evaluation of possible occult GI blood loss now. May need concurrent EUS.  If endoscopic evaluation is negative, complete evaluation with capsule endoscopy. Low threshold to consult hematology.    PLAN: Continue pantoprazole 40 mg BID (needs a refill) Enteroscopy and colonoscopy +/- EUS at the hospital (? With Dr. Rush Landmark) Review timing of EUS with Dr. Rush Landmark Consider capsule endoscopy if enteroscopy and colonoscopy are nondiagnostic  I consented the patient discussing the risks, benefits, and alternatives to endoscopic evaluation. In particular, we discussed the risks that include, but are not limited to, reaction to medication, cardiopulmonary compromise, bleeding requiring blood transfusion, aspiration resulting in pneumonia, perforation requiring surgery, lack of diagnosis, severe illness requiring hospitalization, and  even death. We reviewed the risk of missed lesion including polyps or even cancer. The patient acknowledges these risks and asks that we proceed.  HPI: Andrew Guerra is a 78 y.o. male seen in consult  by Dr. Rush Landmark 11/10/08 and again in November for suspected GI blood loss anemia. He was seen in the office by NP [facility] 11/30/17. He has had outpatient colonoscopy with Dr. Henrene Pastor in 2011 and 2017.  I was involved in his recent hospitalization when I performed an EGD. The history is obtained through the patient and review of his electronic health record. I have summarized his prior GI care in the problem list above.   Dr. Wolfgang Phoenix reached out to me requesting further follow-up on his severe anemia and GI bleeding. I requested that the appointment be scheduled with his [facility] MD but the appointment was scheduled with me.    He now has significant, recurring anemia that required additional transfusion of PRBCs.  Recently seen in the ED with hemoglobin of 5.5  His nephrologist-Dr. Patel-recommended that he do a follow-up with gastroenterology regarding possibility of GI blood loss contributing to this.  Intermittent dark stools that are brown.  Multiple notes in EPIC suggest melena, but, the patient denies any recent black stools.  Wife notes that his energy is not what it used to be.  No pica.  No beeturia.  No hearing loss.    Does not have the strange feeling that he had when he had bleeding ulcers in the past.  No ongoing GI symptoms. No overt GI blood loss. No change in bowel habits. No diarrhea or constipation. No abdominal pain, nausea, or vomiting. No melena, hematochezia, bright red blood per rectum. No epistaxis, hemoptysis, or hematuria.   No identified exacerbating or relieving features.  I've reviewed the following results in EPIC:  Stool negative for occult bleeding 05/01/18 and 05/15/18. He understands that his nephrologist sent of a stool sample through [facility] last week that was positive. Those results are not available to me.   Labs 05/01/18: iron 14, ferritin 79.  Hemoglobin 6.0 05/15/18, received 2 units PRBCs and follow-up CBC 8.8  MRI/MRCP 12/09/17 showed hepatic iron  deposition, tiny cysts and/or biliary hamartomas, no biliary dilatation, several renal lesions including possible hemorrhagic cysts. CT abd/pelvis without contrast 01/09/18 showed proteinaceous or hemorrhagic cysts in the kidneys, no GI findings  Past Medical History:  Diagnosis Date  . 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 02/08/2017   moderate by echo 10/2017 with mean AVG 37mmHg and dimensionless index 0.31 consistent with moderate AS.  [name] Cataract    right eye - surgery to remove  . Elevated PSA 11/29/2016   Patient is followed by [facility].  Most recent PSA near 11.  He has had atypia on biopsy.  November 2008  . Essential hypertension, benign 06/08/2012  . Full dentures   . Glaucoma   . Heart murmur    since childhood, never has caused any problems  . History of rheumatic fever 11/04/2015  . Hyperlipidemia 04/25/2013  . Hypertension   . Malignant neoplasm of left lung ([place]) 02/08/2017  . RBBB 07/12/2017  . Sickle cell trait ([place])    no problems per patient    Past Surgical History:  Procedure Laterality Date  . BIOPSY  11/11/2017   Procedure: BIOPSY;  Surgeon: Lavena Bullion, DO;  Location: [facility] ENDOSCOPY;  Service: Gastroenterology;;  . BIOPSY  01/10/2018   Procedure: BIOPSY;  Surgeon: [name], MD;  Location:  [facility] ENDOSCOPY;  Service: Gastroenterology;;  . cataract eye surgery Right   . COLONOSCOPY  11/2009   hx polyps/[place]  . ESOPHAGOGASTRODUODENOSCOPY N/A 11/11/2017   Procedure: ESOPHAGOGASTRODUODENOSCOPY (EGD);  Surgeon: Lavena Bullion, DO;  [facility];  Service: Gastroenterology;  Laterality: N/A;  . ESOPHAGOGASTRODUODENOSCOPY (EGD) WITH PROPOFOL N/A 01/10/2018   Procedure: ESOPHAGOGASTRODUODENOSCOPY (EGD) WITH PROPOFOL;  Surgeon: [name], MD;  Location: [facility];  Service: Gastroenterology;  Laterality: N/A;  . IR FLUORO GUIDE CV LINE RIGHT  01/16/2018  . IR FLUORO GUIDE CV LINE RIGHT  01/31/2018  . IR US GUIDE VASC  ACCESS RIGHT  01/16/2018  . IR US GUIDE VASC ACCESS RIGHT  01/31/2018  . PROSTATE BIOPSY    . right eye surgery     to lower eye pressure  . RIGHT HEART CATH N/A 01/19/2018   Procedure: RIGHT HEART CATH;  Surgeon: Nelva Bush, MD;  Location: [facility] CV LAB;  Service: Cardiovascular;  Laterality: N/A;  . TONSILLECTOMY    . VIDEO ASSISTED THORACOSCOPY (VATS)/ LOBECTOMY Right 12/26/2015  . wisdom tteeth ext      Current Outpatient Medications  Medication Sig Dispense Refill  . Brinzolamide-Brimonidine (SIMBRINZA) 1-0.2 % SUSP Place 2-3 drops into both eyes 3 (three) times daily.     . cholecalciferol (VITAMIN D3) 25 MCG (1000 UT) tablet Take 1,000 Units by mouth daily.    . Multiple Vitamin (MULTIVITAMIN) tablet Take 1 tablet by mouth daily. With vitamin D    . Netarsudil-Latanoprost (ROCKLATAN) 0.02-0.005 % SOLN Place 1 drop into both eyes at bedtime.     . Nutritional Supplements (FEEDING SUPPLEMENT, NEPRO CARB STEADY,) LIQD Take 237 mLs by mouth 3 (three) times daily between meals. (Patient taking differently: Take 237 mLs by mouth 2 (two) times a day. ) 90 Can 0  . pantoprazole (PROTONIX) 40 MG tablet Take 1 tablet (40 mg total) by mouth 2 (two) times daily. 60 tablet 1  . potassium chloride SA (K-DUR,KLOR-CON) 20 MEQ tablet Take 2 tablets (40 mEq total) by mouth daily. 60 tablet 0  . SYMBICORT 80-4.5 MCG/ACT inhaler TAKE 2 PUFFS BY MOUTH TWICE A DAY (Patient taking differently: Inhale 2 puffs into the lungs as needed (Shortness of breath). ) 10.2 Inhaler 3  . torsemide (DEMADEX) 20 MG tablet Take 60 mg by mouth 2 (two) times daily.    [name] XIIDRA 5 % SOLN Place 1 drop into both eyes as needed (Dry eye).     . NONFORMULARY OR COMPOUNDED ITEM Outpatient PT-please treat and evaluate  Diagnosis: Deconditioning/debility due to prolonged hospitalization, kidney failure, heart failure (Patient not taking: Reported on 05/19/2018) 1 each 0   No current facility-administered medications for this  visit.     Allergies as of 05/19/2018 - Review Complete 05/18/2018  Allergen Reaction Noted  . Ambien [zolpidem tartrate] Other (See Comments) 01/31/2018  . Atorvastatin  07/31/2017    Family History  Problem Relation Age of Onset  . Diabetes Mother   . Cerebral aneurysm Mother   . Lung cancer Father   . Diabetes Sister   . Colon cancer Neg Hx   . Colon polyps Neg Hx   . Esophageal cancer Neg Hx   . Rectal cancer Neg Hx   . Stomach cancer Neg Hx     Social History   Socioeconomic History  . Marital status: Married    Spouse name: Not on file  . Number of children: 2  . Years of education: Not on file  . Highest  education level: Not on file  Occupational History  . Occupation: retired  Scientific laboratory technician  . Financial resource strain: Not on file  . Food insecurity:    Worry: Not on file    Inability: Not on file  . Transportation needs:    Medical: Not on file    Non-medical: Not on file  Tobacco Use  . Smoking status: Former Smoker    Packs/day: 1.00    Types: Cigarettes    Last attempt to quit: 01/19/2011    Years since quitting: 7.3  . Smokeless tobacco: Never Used  Substance and Sexual Activity  . Alcohol use: Yes    Alcohol/week: 1.0 standard drinks    Types: 1 Shots of liquor per week    Comment: rarely  . Drug use: No  . Sexual activity: Not on file  Lifestyle  . Physical activity:    Days per week: Not on file    Minutes per session: Not on file  . Stress: Not on file  Relationships  . Social connections:    Talks on phone: Not on file    Gets together: Not on file    Attends religious service: Not on file    Active member of club or organization: Not on file    Attends meetings of clubs or organizations: Not on file    Relationship status: Not on file  . Intimate partner violence:    Fear of current or ex partner: Not on file    Emotionally abused: Not on file    Physically abused: Not on file    Forced sexual activity: Not on file  Other Topics  Concern  . Not on file  Social History Narrative  . Not on file    Review of Systems: ALL ROS discussed and all others negative except listed in HPI.  Physical Exam: General: in no acute distress Neuro: Alert and appropriate Psych: Normal affect and normal insight Exam otherwise limited due to telehealth encounter.   Jalah Warmuth L. Tarri Glenn, MD, MPH [facility] 05/19/2018, 10:29 AM

## 2018-05-19 NOTE — Patient Instructions (Addendum)
If you are age 80 or older, your body mass index should be between 23-30. Your Body mass index is 28.7 kg/m. If this is out of the aforementioned range listed, please consider follow up with your Primary Care Provider.  If you are age 52 or younger, your body mass index should be between 19-25. Your Body mass index is 28.7 kg/m. If this is out of the aformentioned range listed, please consider follow up with your Primary Care Provider.   Continue Protonix 40 mg twice daily.  We will contact you regarding Enteroscopy and colonoscopy with or without EUS at the hospital as soon as we talk with Dr. Rush Landmark and get a date.  Consider Capsule Endoscopy if Enteroscopy and colonoscopy are nondiagnostic.  Thank you for choosing me and [facility].   [name], MD

## 2018-05-22 ENCOUNTER — Other Ambulatory Visit: Payer: Self-pay

## 2018-05-22 ENCOUNTER — Ambulatory Visit (HOSPITAL_COMMUNITY)
Admission: RE | Admit: 2018-05-22 | Discharge: 2018-05-22 | Disposition: A | Discharge status: Home / Self Care | Payer: Medicare Other | Source: Ambulatory Visit | Attending: Nephrology | Admitting: Nephrology

## 2018-05-22 VITALS — BP 169/90 | HR 93 | Temp 98.6°F | Resp 20 | Ht 70.0 in | Wt 200.0 lb

## 2018-05-22 DIAGNOSIS — D508 Other iron deficiency anemias: Secondary | ICD-10-CM

## 2018-05-22 DIAGNOSIS — D631 Anemia in chronic kidney disease: Secondary | ICD-10-CM | POA: Insufficient documentation

## 2018-05-22 DIAGNOSIS — N184 Chronic kidney disease, stage 4 (severe): Secondary | ICD-10-CM | POA: Diagnosis present

## 2018-05-22 LAB — POCT HEMOGLOBIN-HEMACUE: Hemoglobin: 8.7 g/dL — ABNORMAL LOW (ref 13.0–17.0)

## 2018-05-22 MED ORDER — EPOETIN ALFA-EPBX 10000 UNIT/ML IJ SOLN
40000.0000 [IU] | Freq: Once | INTRAMUSCULAR | Status: AC
  Administered 2018-05-22: 40000 [IU] via SUBCUTANEOUS
  Filled 2018-05-22: qty 4

## 2018-05-22 MED ORDER — EPOETIN ALFA-EPBX 40000 UNIT/ML IJ SOLN: 40000.0000 [IU] | INTRAMUSCULAR | Status: DC

## 2018-05-22 MED ORDER — SODIUM CHLORIDE 0.9 % IV SOLN
510.0000 mg | Freq: Once | INTRAVENOUS | Status: AC
  Administered 2018-05-22: 09:00:00 510 mg via INTRAVENOUS
  Filled 2018-05-22: qty 17

## 2018-05-29 ENCOUNTER — Encounter (HOSPITAL_COMMUNITY)
Admission: RE | Admit: 2018-05-29 | Discharge: 2018-05-29 | Disposition: A | Discharge status: Home / Self Care | Payer: Medicare Other | Source: Ambulatory Visit | Attending: Nephrology | Admitting: Nephrology

## 2018-05-29 ENCOUNTER — Other Ambulatory Visit: Payer: Self-pay

## 2018-05-29 VITALS — BP 166/102 | HR 97 | Temp 97.4°F | Resp 20

## 2018-05-29 DIAGNOSIS — D508 Other iron deficiency anemias: Secondary | ICD-10-CM

## 2018-05-29 DIAGNOSIS — D631 Anemia in chronic kidney disease: Secondary | ICD-10-CM | POA: Insufficient documentation

## 2018-05-29 DIAGNOSIS — N184 Chronic kidney disease, stage 4 (severe): Secondary | ICD-10-CM | POA: Diagnosis present

## 2018-05-29 LAB — RENAL FUNCTION PANEL
Albumin: 2.1 g/dL — ABNORMAL LOW (ref 3.5–5.0)
Anion gap: 10 (ref 5–15)
BUN: 58 mg/dL — ABNORMAL HIGH (ref 8–23)
CO2: 26 mmol/L (ref 22–32)
Calcium: 8.4 mg/dL — ABNORMAL LOW (ref 8.9–10.3)
Chloride: 104 mmol/L (ref 98–111)
Creatinine, Ser: 2.49 mg/dL — ABNORMAL HIGH (ref 0.61–1.24)
GFR calc Af Amer: 27 mL/min — ABNORMAL LOW (ref >60)
GFR calc non Af Amer: 24 mL/min — ABNORMAL LOW (ref >60)
Glucose, Bld: 89 mg/dL (ref 70–99)
Phosphorus: 3.6 mg/dL (ref 2.5–4.6)
Potassium: 3.9 mmol/L (ref 3.5–5.1)
Sodium: 140 mmol/L (ref 135–145)

## 2018-05-29 LAB — IRON AND TIBC
Iron: 27 ug/dL — ABNORMAL LOW (ref 45–182)
Saturation Ratios: 9 % — ABNORMAL LOW (ref 17.9–39.5)
TIBC: 290 ug/dL (ref 250–450)
UIBC: 263 ug/dL

## 2018-05-29 LAB — CBC WITH DIFFERENTIAL/PLATELET
Abs Immature Granulocytes: 0 10*3/uL (ref 0.00–0.07)
Basophils Absolute: 0 10*3/uL (ref 0.0–0.1)
Basophils Relative: 0 %
Eosinophils Absolute: 0.2 10*3/uL (ref 0.0–0.5)
Eosinophils Relative: 2 %
HCT: 34.3 % — ABNORMAL LOW (ref 39.0–52.0)
Hemoglobin: 10.4 g/dL — ABNORMAL LOW (ref 13.0–17.0)
Lymphocytes Relative: 18 %
Lymphs Abs: 1.7 10*3/uL (ref 0.7–4.0)
MCH: 25.3 pg — ABNORMAL LOW (ref 26.0–34.0)
MCHC: 30.3 g/dL (ref 30.0–36.0)
MCV: 83.5 fL (ref 80.0–100.0)
Monocytes Absolute: 0.3 10*3/uL (ref 0.1–1.0)
Monocytes Relative: 3 %
Neutro Abs: 7.4 10*3/uL (ref 1.7–7.7)
Neutrophils Relative %: 77 %
Platelets: 244 10*3/uL (ref 150–400)
RBC: 4.11 MIL/uL — ABNORMAL LOW (ref 4.22–5.81)
RDW: 26.3 % — ABNORMAL HIGH (ref 11.5–15.5)
WBC: 9.6 10*3/uL (ref 4.0–10.5)
nRBC: 0 % (ref 0.0–0.2)
nRBC: 0 /100 WBC

## 2018-05-29 LAB — FERRITIN: Ferritin: 278 ng/mL (ref 24–336)

## 2018-05-29 LAB — MAGNESIUM: Magnesium: 2.1 mg/dL (ref 1.7–2.4)

## 2018-05-29 LAB — POCT HEMOGLOBIN-HEMACUE: Hemoglobin: 10.1 g/dL — ABNORMAL LOW (ref 13.0–17.0)

## 2018-05-29 MED ORDER — EPOETIN ALFA-EPBX 40000 UNIT/ML IJ SOLN
40000.0000 [IU] | INTRAMUSCULAR | Status: DC
  Administered 2018-05-29: 09:00:00 40000 [IU] via SUBCUTANEOUS

## 2018-05-29 MED ORDER — EPOETIN ALFA-EPBX 10000 UNIT/ML IJ SOLN
40000.0000 [IU] | Freq: Once | INTRAMUSCULAR | Status: DC
  Filled 2018-05-29: qty 4

## 2018-05-30 ENCOUNTER — Telehealth (INDEPENDENT_AMBULATORY_CARE_PROVIDER_SITE_OTHER): Payer: Medicare Other | Admitting: Cardiology

## 2018-05-30 ENCOUNTER — Encounter: Payer: Self-pay | Admitting: Emergency Medicine

## 2018-05-30 ENCOUNTER — Other Ambulatory Visit: Payer: Self-pay

## 2018-05-30 ENCOUNTER — Encounter: Payer: Self-pay | Admitting: Cardiology

## 2018-05-30 VITALS — Ht 70.0 in | Wt 210.0 lb

## 2018-05-30 DIAGNOSIS — I10 Essential (primary) hypertension: Secondary | ICD-10-CM | POA: Diagnosis not present

## 2018-05-30 DIAGNOSIS — I272 Pulmonary hypertension, unspecified: Secondary | ICD-10-CM | POA: Insufficient documentation

## 2018-05-30 DIAGNOSIS — I480 Paroxysmal atrial fibrillation: Secondary | ICD-10-CM | POA: Diagnosis not present

## 2018-05-30 DIAGNOSIS — I5032 Chronic diastolic (congestive) heart failure: Secondary | ICD-10-CM | POA: Diagnosis not present

## 2018-05-30 DIAGNOSIS — Q230 Congenital stenosis of aortic valve: Secondary | ICD-10-CM | POA: Diagnosis not present

## 2018-05-30 DIAGNOSIS — Q231 Congenital insufficiency of aortic valve: Secondary | ICD-10-CM

## 2018-05-30 NOTE — Patient Instructions (Signed)
Medication Instructions:   If you need a refill on your cardiac medications before your next appointment, please call your pharmacy.   Lab work:  If you have labs (blood work) drawn today and your tests are completely normal, you will receive your results only by: [name] MyChart Message (if you have MyChart) OR . A paper copy in the mail If you have any lab test that is abnormal or we need to change your treatment, we will call you to review the results.  Testing/Procedures: Your physician has requested that you have an echocardiogram in one year. Echocardiography is a painless test that uses sound waves to create images of your heart. It provides your doctor with information about the size and shape of your heart and how well your heart's chambers and valves are working. This procedure takes approximately one hour. There are no restrictions for this procedure.  Follow-Up: At [facility], you and your health needs are our priority.  As part of our continuing mission to provide you with exceptional heart care, we have created designated Provider Care Teams.  These Care Teams include your primary Cardiologist (physician) and Advanced Practice Providers (APPs -  Physician Assistants and Nurse Practitioners) who all work together to provide you with the care you need, when you need it. You will need a follow up appointment in 6 months.  Please call our office 2 months in advance to schedule this appointment.  You may see Fransico Him, MD or one of the following Advanced Practice Providers on your designated Care Team:   [place], PA-C Melina Copa, PA-C . Ermalinda Barrios, PA-C

## 2018-05-30 NOTE — Progress Notes (Signed)
Virtual Visit via Video Note   This visit type was conducted due to national recommendations for restrictions regarding the COVID-19 Pandemic (e.g. social distancing) in an effort to limit this patient's exposure and mitigate transmission in our community.  Due to his co-morbid illnesses, this patient is at least at moderate risk for complications without adequate follow up.  This format is felt to be most appropriate for this patient at this time.  All issues noted in this document were discussed and addressed.  A limited physical exam was performed with this format.  Please refer to the patient's chart for his consent to telehealth for [facility].   Evaluation Performed:  Follow-up visit  This visit type was conducted due to national recommendations for restrictions regarding the COVID-19 Pandemic (e.g. social distancing).  This format is felt to be most appropriate for this patient at this time.  All issues noted in this document were discussed and addressed.  No physical exam was performed (except for noted visual exam findings with Video Visits).  Please refer to the patient's chart (MyChart message for video visits and phone note for telephone visits) for the patient's consent to telehealth for [facility].  Date:  05/30/2018   ID:  Andrew Guerra, DOB [birth year], MRN [id number]  Patient Location:  Home  Provider location:   [place]  PCP:  [name], MD  Cardiologist:  Fransico Him, MD  Electrophysiologist:  None   Chief Complaint:  HTN, bicuspid AV, moderate AS, PAF  History of Present Illness:    Andrew Guerra is a 78 y.o. male who presents via audio/video conferencing for a telehealth visit today.    Andrew Guerra is a 78 y.o. male with a PMH of HTN, HLD, bicuspid aortic valve with moderate AS, chronic diastolic CHF NSCLC s/p resection and radiation, PUD and recent diagnosis of PAF .  He had a long admission 12/23-1/23 for severe anemia with Hgb of 6.4 and acute  GI bleed. He underwent EGD which showed gastric/duodenal ulcers. Hospital course was prolonged with ATN requiring dialysis, bilateral pneumonia, UTI and  delirium. Seen by cardiology for new onset afib RVR and acute diastolic CHF. EF 65% but severe RV dilatation and moderate RV dysfunction was noted. Right heart cath showed normal left-sided filling pressures but mildly elevated right-sided filling pressures as well as mild pulmonary hypertension. Cardiac output was normal. He was treated with IV amidoarone and then changed to PO dose. BP soft to add BB or CCB. CHADSVASc 3 but not felt to be a candidate for anticoagulation.  He was seen back in the office and was doing well and was in normal sinus rhythm.  Amiodarone was continued.  He did not have any evidence of volume overload.  Due to his CKD with creatinine around 3 or more DOAC was felt not to be safe to use.  He was started on Coumadin once GI gave there blessing that we can start anticoagulation.  He has had some dark stools over the past few weeks and his coumadin was stopped. He has been iron infusions. He saw Dr. Baird Kay in EP who felt PAF was reactive to GI bleed recently and stopped his amio.  Dr. Curt Bears ordered an event monitor to assess afib burden but he has not done it yet.  He is here today for followup and is doing well.  He denies any chest pain or pressure, SOB, DOE (unless he moves too fast), PND, orthopnea,  dizziness, palpitations or  syncope. His LE edema has improved.  He still has some mild LE which is controlled on diuretics.  He is compliant with his meds and is tolerating meds with no SE.    The patient does not have symptoms concerning for COVID-19 infection (fever, chills, cough, or new shortness of breath).    Prior CV studies:   The following studies were reviewed today:  2D echo and right heart cath  Past Medical History:  Diagnosis Date   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 02/08/2017   moderate by echo  10/2017 with mean AVG 74mmHg and dimensionless index 0.31 consistent with moderate AS.   Cataract    right eye - surgery to remove   Elevated PSA 11/29/2016   Patient is followed [facility].  Most recent PSA near 11.  He has had atypia on biopsy.  November 2008   Essential hypertension, benign 06/08/2012   Full dentures    Glaucoma    Heart murmur    since childhood, never has caused any problems   History of rheumatic fever 11/04/2015   Hyperlipidemia 04/25/2013   Hypertension    Malignant neoplasm of left lung ([place]) 02/08/2017   RBBB 07/12/2017   Sickle cell trait ([place])    no problems per patient   Past Surgical History:  Procedure Laterality Date   BIOPSY  11/11/2017   Procedure: BIOPSY;  Surgeon: Lavena Bullion, DO;  Location: [place] ENDOSCOPY;  Service: Gastroenterology;;   BIOPSY  01/10/2018   Procedure: BIOPSY;  Surgeon: [name], MD;  Location: [facility] ENDOSCOPY;  Service: Gastroenterology;;   cataract eye surgery Right    COLONOSCOPY  11/2009   hx polyps/[place]   ESOPHAGOGASTRODUODENOSCOPY N/A 11/11/2017   Procedure: ESOPHAGOGASTRODUODENOSCOPY (EGD);  Surgeon: Lavena Bullion, DO;  Location: [facility] ENDOSCOPY;  Service: Gastroenterology;  Laterality: N/A;   ESOPHAGOGASTRODUODENOSCOPY (EGD) WITH PROPOFOL N/A 01/10/2018   Procedure: ESOPHAGOGASTRODUODENOSCOPY (EGD) WITH PROPOFOL;  Surgeon: [name], MD;  Location: [place];  Service: Gastroenterology;  Laterality: N/A;   IR FLUORO GUIDE CV LINE RIGHT  01/16/2018   IR FLUORO GUIDE CV LINE RIGHT  01/31/2018   IR US GUIDE VASC ACCESS RIGHT  01/16/2018   IR US GUIDE VASC ACCESS RIGHT  01/31/2018   PROSTATE BIOPSY     right eye surgery     to lower eye pressure   RIGHT HEART CATH N/A 01/19/2018   Procedure: RIGHT HEART CATH;  Surgeon: Nelva Bush, MD;  Location: [place] CV LAB;  Service: Cardiovascular;  Laterality: N/A;   TONSILLECTOMY     VIDEO ASSISTED THORACOSCOPY  (VATS)/ LOBECTOMY Right 12/26/2015   wisdom tteeth ext       Current Meds  Medication Sig   Brinzolamide-Brimonidine (SIMBRINZA) 1-0.2 % SUSP Place 2-3 drops into both eyes 3 (three) times daily.    budesonide-formoterol (SYMBICORT) 80-4.5 MCG/ACT inhaler Inhale 2 puffs into the lungs as needed.   cholecalciferol (VITAMIN D3) 25 MCG (1000 UT) tablet Take 1,000 Units by mouth daily.   Multiple Vitamin (MULTIVITAMIN) tablet Take 1 tablet by mouth daily. With vitamin D   Netarsudil-Latanoprost (ROCKLATAN) 0.02-0.005 % SOLN Place 1 drop into both eyes at bedtime.    Nutritional Supplements (FEEDING SUPPLEMENT, NEPRO CARB STEADY,) LIQD Take 237 mLs by mouth 2 (two) times daily.   pantoprazole (PROTONIX) 40 MG tablet Take 1 tablet (40 mg total) by mouth 2 (two) times daily.   potassium chloride SA (K-DUR,KLOR-CON) 20 MEQ tablet Take 2 tablets (40 mEq total) by mouth  daily.   torsemide (DEMADEX) 20 MG tablet Take 60 mg by mouth 2 (two) times daily.   XIIDRA 5 % SOLN Place 1 drop into both eyes as needed (Dry eye).      Allergies:   Ambien [zolpidem tartrate] and Atorvastatin   Social History   Tobacco Use   Smoking status: Former Smoker    Packs/day: 1.00    Types: Cigarettes    Last attempt to quit: 01/19/2011    Years since quitting: 7.3   Smokeless tobacco: Never Used  Substance Use Topics   Alcohol use: Yes    Alcohol/week: 1.0 standard drinks    Types: 1 Shots of liquor per week    Comment: rarely   Drug use: No     Family Hx: The patient's family history includes Cerebral aneurysm in his mother; Diabetes in his mother and sister; Lung cancer in his father. There is no history of Colon cancer, Colon polyps, Esophageal cancer, Rectal cancer, or Stomach cancer.  ROS:   Please see the history of present illness.     All other systems reviewed and are negative.   Labs/Other Tests and Data Reviewed:    Recent Labs: 01/09/2018: B Natriuretic Peptide  598.0 05/01/2018: ALT 17 05/29/2018: BUN 58; Creatinine, Ser 2.49; Hemoglobin 10.1; Magnesium 2.1; Platelets 244; Potassium 3.9; Sodium 140   Recent Lipid Panel Lab Results  Component Value Date/Time   CHOL 177 09/05/2017 09:18 AM   TRIG 69 09/05/2017 09:18 AM   HDL 49 09/05/2017 09:18 AM   CHOLHDL 3.6 09/05/2017 09:18 AM   CHOLHDL 3.7 04/10/2013 08:08 AM   LDLCALC 114 (H) 09/05/2017 09:18 AM    Wt Readings from Last 3 Encounters:  05/30/18 210 lb (95.3 kg)  05/22/18 200 lb (90.7 kg)  05/19/18 200 lb (90.7 kg)     Objective:    Vital Signs:  Ht 5\' 10"  (1.778 m)    Wt 210 lb (95.3 kg)    BMI 30.13 kg/m     ASSESSMENT & PLAN:    1.  Aortic stenosis -  2D echo 01/13/2018 showed normal LV function with EF 60 to 65% with mild to moderate aortic stenosis with mean aortic valve gradient 22 mmHg.  Prior echoes had shown bicuspid aortic valve.  We will continue to follow with yearly echo to make sure this does not progress.  2.  Moderate pulmonary hypertension -2D echo showed moderate pulmonary with mildly dilated RA, moderate to severely dilated RV with moderately reduced RV function and mild TR.  Right heart cath showed normal left heart filling pressures with mildly elevated right heart filling pressures and mild to moderate pulmonary hypertension.  Cardiac output index were normal.  His PA pressures were 70 over 15 mmHg PVR was 3.5 x 53.9 Wood units by thermal dilution.  We will continue diuretics and follow with serial echo yearly.  3.  Paroxysmal atrial fibrillation - he thinks he has been maintaining normal sinus rhythm.  He has not had any palpitations.  He was started on Coumadin for CHADS2VASC score of 3.  It was felt not to be a candidate for DOAC therapy due to his advanced CKD.  He has had some dark stools over the past few weeks and his coumadin was stopped. He has been iron infusions. He saw Dr. Baird Kay in EP who felt PAF was reactive to GI bleed recently and stopped his amio.   Dr. Curt Bears ordered an event monitor to assess afib burden but he has not  done it yet.  I encouraged him to start the heart monitor.   4.  Chronic diastolic CHF -his weight has been stable and his LE edema and SOB are stable.  he will continue on Demadex 20 mg daily with potassium supplementation. His last creatinine was 2.49 on 05/29/2018.    5.  COVID-19 Education:The signs and symptoms of COVID-19 were discussed with the patient and how to seek care for testing (follow up with PCP or arrange E-visit).  The importance of social distancing was discussed today.  Patient Risk:   After full review of this patient's clinical status, I feel that they are at least moderate risk at this time.  Time:   Today, I have spent 15 minutes directly with the patient on telephone discussing medical problems including aortic stenosis, pulmonary HTN, PAF, CHF.  We also reviewed the symptoms of COVID 19 and the ways to protect against contracting the virus with telehealth technology.  I spent an additional 5 minutes reviewing patient's chart including 2D echo, right heart cath and labs.  Medication Adjustments/Labs and Tests Ordered: Current medicines are reviewed at length with the patient today.  Concerns regarding medicines are outlined above.  Tests Ordered: No orders of the defined types were placed in this encounter.  Medication Changes: No orders of the defined types were placed in this encounter.   Disposition:  Follow up in 6 month(s)  Signed, Fransico Him, MD  05/30/2018 9:36 AM    [facility]

## 2018-05-31 ENCOUNTER — Ambulatory Visit (INDEPENDENT_AMBULATORY_CARE_PROVIDER_SITE_OTHER): Payer: Medicare Other

## 2018-05-31 DIAGNOSIS — I480 Paroxysmal atrial fibrillation: Secondary | ICD-10-CM | POA: Diagnosis not present

## 2018-06-02 ENCOUNTER — Ambulatory Visit: Payer: Medicare Other

## 2018-06-05 ENCOUNTER — Other Ambulatory Visit: Payer: Self-pay

## 2018-06-05 ENCOUNTER — Ambulatory Visit (HOSPITAL_COMMUNITY)
Admission: RE | Admit: 2018-06-05 | Discharge: 2018-06-05 | Disposition: A | Discharge status: Home / Self Care | Payer: Medicare Other | Source: Ambulatory Visit | Attending: Nephrology | Admitting: Nephrology

## 2018-06-05 VITALS — BP 168/97 | HR 97 | Temp 96.8°F | Resp 20

## 2018-06-05 DIAGNOSIS — D508 Other iron deficiency anemias: Secondary | ICD-10-CM

## 2018-06-05 DIAGNOSIS — D631 Anemia in chronic kidney disease: Secondary | ICD-10-CM | POA: Insufficient documentation

## 2018-06-05 DIAGNOSIS — N184 Chronic kidney disease, stage 4 (severe): Secondary | ICD-10-CM | POA: Diagnosis not present

## 2018-06-05 LAB — POCT HEMOGLOBIN-HEMACUE: Hemoglobin: 10.8 g/dL — ABNORMAL LOW (ref 13.0–17.0)

## 2018-06-05 MED ORDER — EPOETIN ALFA-EPBX 40000 UNIT/ML IJ SOLN: 40000.0000 [IU] | INTRAMUSCULAR | Status: DC

## 2018-06-05 MED ORDER — EPOETIN ALFA-EPBX 10000 UNIT/ML IJ SOLN
40000.0000 [IU] | Freq: Once | INTRAMUSCULAR | Status: AC
  Administered 2018-06-05: 09:00:00 40000 [IU] via SUBCUTANEOUS
  Filled 2018-06-05: qty 4

## 2018-06-05 NOTE — Discharge Instructions (Signed)
Epoetin Alfa injection °What is this medicine? °EPOETIN ALFA (e POE e tin AL fa) helps your body make more red blood cells. This medicine is used to treat anemia caused by chronic kidney disease, cancer chemotherapy, or HIV-therapy. It may also be used before surgery if you have anemia. °This medicine may be used for other purposes; ask your health care provider or pharmacist if you have questions. °COMMON BRAND NAME(S): Epogen, Procrit, Retacrit °What should I tell my health care provider before I take this medicine? °They need to know if you have any of these conditions: °-cancer °-heart disease °-high blood pressure °-history of blood clots °-history of stroke °-low levels of folate, iron, or vitamin B12 in the blood °-seizures °-an unusual or allergic reaction to erythropoietin, albumin, benzyl alcohol, hamster proteins, other medicines, foods, dyes, or preservatives °-pregnant or trying to get pregnant °-breast-feeding °How should I use this medicine? °This medicine is for injection into a vein or under the skin. It is usually given by a health care professional in a hospital or clinic setting. °If you get this medicine at home, you will be taught how to prepare and give this medicine. Use exactly as directed. Take your medicine at regular intervals. Do not take your medicine more often than directed. °It is important that you put your used needles and syringes in a special sharps container. Do not put them in a trash can. If you do not have a sharps container, call your pharmacist or healthcare provider to get one. °A special MedGuide will be given to you by the pharmacist with each prescription and refill. Be sure to read this information carefully each time. °Talk to your pediatrician regarding the use of this medicine in children. While this drug may be prescribed for selected conditions, precautions do apply. °Overdosage: If you think you have taken too much of this medicine contact a poison control center  or emergency room at once. °NOTE: This medicine is only for you. Do not share this medicine with others. °What if I miss a dose? °If you miss a dose, take it as soon as you can. If it is almost time for your next dose, take only that dose. Do not take double or extra doses. °What may interact with this medicine? °Interactions have not been studied. °This list may not describe all possible interactions. Give your health care provider a list of all the medicines, herbs, non-prescription drugs, or dietary supplements you use. Also tell them if you smoke, drink alcohol, or use illegal drugs. Some items may interact with your medicine. °What should I watch for while using this medicine? °Your condition will be monitored carefully while you are receiving this medicine. °You may need blood work done while you are taking this medicine. °This medicine may cause a decrease in vitamin B6. You should make sure that you get enough vitamin B6 while you are taking this medicine. Discuss the foods you eat and the vitamins you take with your health care professional. °What side effects may I notice from receiving this medicine? °Side effects that you should report to your doctor or health care professional as soon as possible: °-allergic reactions like skin rash, itching or hives, swelling of the face, lips, or tongue °-seizures °-signs and symptoms of a blood clot such as breathing problems; changes in vision; chest pain; severe, sudden headache; pain, swelling, warmth in the leg; trouble speaking; sudden numbness or weakness of the face, arm or leg °-signs and symptoms of a stroke   like changes in vision; confusion; trouble speaking or understanding; severe headaches; sudden numbness or weakness of the face, arm or leg; trouble walking; dizziness; loss of balance or coordination °Side effects that usually do not require medical attention (report to your doctor or health care professional if they continue or are  bothersome): °-chills °-cough °-dizziness °-fever °-headaches °-joint pain °-muscle cramps °-muscle pain °-nausea, vomiting °-pain, redness, or irritation at site where injected °This list may not describe all possible side effects. Call your doctor for medical advice about side effects. You may report side effects to FDA at 1-800-FDA-1088. °Where should I keep my medicine? °Keep out of the reach of children. °Store in a refrigerator between 2 and 8 degrees C (36 and 46 degrees F). Do not freeze or shake. Throw away any unused portion if using a single-dose vial. Multi-dose vials can be kept in the refrigerator for up to 21 days after the initial dose. Throw away unused medicine. °NOTE: This sheet is a summary. It may not cover all possible information. If you have questions about this medicine, talk to your doctor, pharmacist, or health care provider. °© 2019 [employer] (2016-08-13 08:35:19) ° °

## 2018-06-07 ENCOUNTER — Telehealth: Payer: Self-pay | Admitting: Cardiology

## 2018-06-07 NOTE — Telephone Encounter (Signed)
New Message            Patient is calling Andrew Guerra back, would like for her to call again

## 2018-06-08 ENCOUNTER — Ambulatory Visit: Payer: Medicare Other | Admitting: Cardiology

## 2018-06-08 NOTE — Telephone Encounter (Signed)
Pt reports placing monitor on 5/13. Pt will wear for 2-5 more days before removing and mailing back.  Pt understands if will be about a week or more before receiving result once mailed.  He understands I will call him once result reviewed by [place]. Pt reports having some SOB, but "doing fine".  He mentions that he didn't realize triggering events until recently so there may not be triggered events that he felt. He understands we will see what it shows and go from there.

## 2018-06-09 ENCOUNTER — Other Ambulatory Visit (HOSPITAL_COMMUNITY): Payer: Self-pay | Admitting: *Deleted

## 2018-06-13 ENCOUNTER — Ambulatory Visit (HOSPITAL_COMMUNITY)
Admission: RE | Admit: 2018-06-13 | Discharge: 2018-06-13 | Disposition: A | Discharge status: Home / Self Care | Payer: Medicare Other | Source: Ambulatory Visit | Attending: Nephrology | Admitting: Nephrology

## 2018-06-13 ENCOUNTER — Other Ambulatory Visit: Payer: Self-pay

## 2018-06-13 VITALS — BP 158/87 | HR 79 | Temp 98.6°F | Resp 20 | Ht 71.0 in | Wt 200.0 lb

## 2018-06-13 DIAGNOSIS — N184 Chronic kidney disease, stage 4 (severe): Secondary | ICD-10-CM | POA: Diagnosis present

## 2018-06-13 DIAGNOSIS — D508 Other iron deficiency anemias: Secondary | ICD-10-CM

## 2018-06-13 DIAGNOSIS — D631 Anemia in chronic kidney disease: Secondary | ICD-10-CM | POA: Diagnosis not present

## 2018-06-13 LAB — POCT HEMOGLOBIN-HEMACUE: Hemoglobin: 10.9 g/dL — ABNORMAL LOW (ref 13.0–17.0)

## 2018-06-13 MED ORDER — SODIUM CHLORIDE 0.9 % IV SOLN
510.0000 mg | INTRAVENOUS | Status: DC
  Administered 2018-06-13: 510 mg via INTRAVENOUS
  Filled 2018-06-13: qty 17

## 2018-06-13 MED ORDER — EPOETIN ALFA-EPBX 10000 UNIT/ML IJ SOLN
40000.0000 [IU] | Freq: Once | INTRAMUSCULAR | Status: AC
  Administered 2018-06-13: 40000 [IU] via SUBCUTANEOUS
  Filled 2018-06-13: qty 4

## 2018-06-13 MED ORDER — EPOETIN ALFA-EPBX 40000 UNIT/ML IJ SOLN: 40000.0000 [IU] | INTRAMUSCULAR | Status: DC

## 2018-06-15 ENCOUNTER — Telehealth: Payer: Self-pay | Admitting: Gastroenterology

## 2018-06-15 DIAGNOSIS — D649 Anemia, unspecified: Secondary | ICD-10-CM

## 2018-06-15 DIAGNOSIS — K279 Peptic ulcer, site unspecified, unspecified as acute or chronic, without hemorrhage or perforation: Secondary | ICD-10-CM

## 2018-06-15 DIAGNOSIS — R195 Other fecal abnormalities: Secondary | ICD-10-CM

## 2018-06-15 MED ORDER — NA SULFATE-K SULFATE-MG SULF 17.5-3.13-1.6 GM/177ML PO SOLN: 1.0000 | ORAL | 0 refills | Status: AC

## 2018-06-15 NOTE — Telephone Encounter (Signed)
Rx sent to pharmacy   

## 2018-06-15 NOTE — Telephone Encounter (Signed)
Pt is scheduled for EGD/COL 06/22/18 and needs the prep soln sent to [facility] on [address].

## 2018-06-15 NOTE — Telephone Encounter (Signed)
rx sent to pharmacy

## 2018-06-20 ENCOUNTER — Other Ambulatory Visit: Payer: Self-pay

## 2018-06-20 ENCOUNTER — Encounter: Payer: Self-pay | Admitting: Family Medicine

## 2018-06-20 ENCOUNTER — Telehealth: Payer: Self-pay | Admitting: *Deleted

## 2018-06-20 ENCOUNTER — Ambulatory Visit (HOSPITAL_COMMUNITY)
Admission: RE | Admit: 2018-06-20 | Discharge: 2018-06-20 | Disposition: A | Discharge status: Home / Self Care | Payer: Medicare Other | Source: Ambulatory Visit | Attending: Nephrology | Admitting: Nephrology

## 2018-06-20 ENCOUNTER — Ambulatory Visit (INDEPENDENT_AMBULATORY_CARE_PROVIDER_SITE_OTHER): Payer: Medicare Other | Admitting: Family Medicine

## 2018-06-20 VITALS — BP 157/87 | HR 84 | Temp 98.6°F | Resp 20 | Ht 71.0 in | Wt 202.0 lb

## 2018-06-20 DIAGNOSIS — N184 Chronic kidney disease, stage 4 (severe): Secondary | ICD-10-CM | POA: Diagnosis not present

## 2018-06-20 DIAGNOSIS — D500 Iron deficiency anemia secondary to blood loss (chronic): Secondary | ICD-10-CM

## 2018-06-20 DIAGNOSIS — D631 Anemia in chronic kidney disease: Secondary | ICD-10-CM | POA: Diagnosis not present

## 2018-06-20 DIAGNOSIS — D508 Other iron deficiency anemias: Secondary | ICD-10-CM

## 2018-06-20 DIAGNOSIS — I10 Essential (primary) hypertension: Secondary | ICD-10-CM | POA: Diagnosis not present

## 2018-06-20 DIAGNOSIS — N183 Chronic kidney disease, stage 3 unspecified: Secondary | ICD-10-CM

## 2018-06-20 LAB — POCT HEMOGLOBIN-HEMACUE: Hemoglobin: 10.8 g/dL — ABNORMAL LOW (ref 13.0–17.0)

## 2018-06-20 MED ORDER — AMLODIPINE BESYLATE 2.5 MG PO TABS: 2.5000 mg | ORAL_TABLET | Freq: Every day | ORAL | 1 refills | Status: DC

## 2018-06-20 MED ORDER — SODIUM CHLORIDE 0.9 % IV SOLN
510.0000 mg | INTRAVENOUS | Status: DC
  Administered 2018-06-20: 510 mg via INTRAVENOUS
  Filled 2018-06-20: qty 510

## 2018-06-20 MED ORDER — EPOETIN ALFA-EPBX 40000 UNIT/ML IJ SOLN
40000.0000 [IU] | INTRAMUSCULAR | Status: DC
  Administered 2018-06-20: 40000 [IU] via SUBCUTANEOUS
  Filled 2018-06-20: qty 1

## 2018-06-20 NOTE — Telephone Encounter (Signed)
Covid-19 screening questions  Have you traveled in the last 14 days?no If yes where?  Do you now or have you had a fever in the last 14 days?no  Do you have any respiratory symptoms of shortness of breath or cough now or in the last 14 days?no  Do you have any family members or close contacts with diagnosed or suspected Covid-19 in the past 14 days?no  Have you been tested for Covid-19 and found to be positive?no   Pt made aware of care partner policy and will bring a mask with him.

## 2018-06-20 NOTE — Progress Notes (Signed)
   Subjective:    Patient ID: Andrew Guerra, male    DOB: 02/20/1940, 78 y.o.   MRN: [id number]  Hypertension  This is a chronic problem. Pertinent negatives include no chest pain, headaches or shortness of breath. There are no compliance problems.   Blood pressure has been running high lately he will be doing a colonoscopy EGD coming up He has underlying heart problems blood pressure problems and kidney problems  Has had some anemia due to GI loss will be doing colonoscopy EGD Pt states no problems or concerns today.   Virtual Visit via Video Note  I connected with Andrew Guerra on 06/20/18 at 10:00 AM EDT by a video enabled telemedicine application and verified that I am speaking with the correct person using two identifiers.  Location: Patient: home Provider: office   I discussed the limitations of evaluation and management by telemedicine and the availability of in person appointments. The patient expressed understanding and agreed to proceed.  History of Present Illness:    Observations/Objective:   Assessment and Plan:   Follow Up Instructions:    I discussed the assessment and treatment plan with the patient. The patient was provided an opportunity to ask questions and all were answered. The patient agreed anemia issues under good control with the plan and demonstrated an understanding of the instructions.   The patient was advised to call back or seek an in-person evaluation if the symptoms worsen or if the condition fails to improve as anticipated.  I provided 15 minutes of non-face-to-face time during this encounter.     Review of Systems  Constitutional: Negative for activity change, fatigue and fever.  HENT: Negative for congestion and rhinorrhea.   Respiratory: Negative for cough and shortness of breath.   Cardiovascular: Negative for chest pain and leg swelling.  Gastrointestinal: Negative for abdominal pain, diarrhea and nausea.  Genitourinary: Negative  for dysuria and hematuria.  Neurological: Negative for weakness and headaches.  Psychiatric/Behavioral: Negative for agitation and behavioral problems.       Objective:   Physical Exam   Patient had virtual visit Appears to be in no distress Atraumatic Neuro able to relate and oriented No apparent resp distress Color normal      Assessment & Plan:  Anemia issues under good control Following up with GI later this week for testing Renal insufficiency we will do lab work Continue current measures Systolic blood pressure elevated Will need to reintroduce amlodipine 2.5 mg amlodipine, #30, 1 daily We will keep cardiology in the loop  Follow-up 3 months

## 2018-06-20 NOTE — Telephone Encounter (Signed)
Left message for first COVID screen attempt. SM

## 2018-06-22 ENCOUNTER — Other Ambulatory Visit: Payer: Self-pay

## 2018-06-22 ENCOUNTER — Encounter: Payer: Medicare Other | Admitting: Gastroenterology

## 2018-06-22 ENCOUNTER — Encounter (HOSPITAL_COMMUNITY)
Admission: AD | Disposition: A | Discharge status: Home / Self Care | Payer: Self-pay | Source: Home / Self Care | Attending: Gastroenterology

## 2018-06-22 ENCOUNTER — Ambulatory Visit (HOSPITAL_COMMUNITY): Payer: Medicare Other | Admitting: Certified Registered Nurse Anesthetist

## 2018-06-22 ENCOUNTER — Ambulatory Visit (HOSPITAL_COMMUNITY)
Admission: AD | Admit: 2018-06-22 | Discharge: 2018-06-22 | Disposition: A | Discharge status: Home / Self Care | Payer: Medicare Other | Attending: Gastroenterology | Admitting: Gastroenterology

## 2018-06-22 ENCOUNTER — Encounter (HOSPITAL_COMMUNITY): Payer: Self-pay

## 2018-06-22 ENCOUNTER — Other Ambulatory Visit (HOSPITAL_COMMUNITY)
Admission: RE | Admit: 2018-06-22 | Discharge: 2018-06-22 | Disposition: A | Discharge status: Home / Self Care | Payer: Medicare Other | Source: Ambulatory Visit | Attending: Gastroenterology | Admitting: Gastroenterology

## 2018-06-22 DIAGNOSIS — K2950 Unspecified chronic gastritis without bleeding: Secondary | ICD-10-CM | POA: Insufficient documentation

## 2018-06-22 DIAGNOSIS — I10 Essential (primary) hypertension: Secondary | ICD-10-CM | POA: Diagnosis not present

## 2018-06-22 DIAGNOSIS — Z85118 Personal history of other malignant neoplasm of bronchus and lung: Secondary | ICD-10-CM | POA: Diagnosis not present

## 2018-06-22 DIAGNOSIS — K6289 Other specified diseases of anus and rectum: Secondary | ICD-10-CM | POA: Diagnosis not present

## 2018-06-22 DIAGNOSIS — K3189 Other diseases of stomach and duodenum: Secondary | ICD-10-CM | POA: Diagnosis not present

## 2018-06-22 DIAGNOSIS — Z8249 Family history of ischemic heart disease and other diseases of the circulatory system: Secondary | ICD-10-CM | POA: Insufficient documentation

## 2018-06-22 DIAGNOSIS — I350 Nonrheumatic aortic (valve) stenosis: Secondary | ICD-10-CM | POA: Diagnosis not present

## 2018-06-22 DIAGNOSIS — K5520 Angiodysplasia of colon without hemorrhage: Secondary | ICD-10-CM | POA: Insufficient documentation

## 2018-06-22 DIAGNOSIS — K31819 Angiodysplasia of stomach and duodenum without bleeding: Secondary | ICD-10-CM | POA: Insufficient documentation

## 2018-06-22 DIAGNOSIS — K921 Melena: Secondary | ICD-10-CM | POA: Insufficient documentation

## 2018-06-22 DIAGNOSIS — E785 Hyperlipidemia, unspecified: Secondary | ICD-10-CM | POA: Diagnosis not present

## 2018-06-22 DIAGNOSIS — Z8679 Personal history of other diseases of the circulatory system: Secondary | ICD-10-CM | POA: Insufficient documentation

## 2018-06-22 DIAGNOSIS — I4510 Unspecified right bundle-branch block: Secondary | ICD-10-CM | POA: Insufficient documentation

## 2018-06-22 DIAGNOSIS — D123 Benign neoplasm of transverse colon: Secondary | ICD-10-CM | POA: Diagnosis not present

## 2018-06-22 DIAGNOSIS — D125 Benign neoplasm of sigmoid colon: Secondary | ICD-10-CM | POA: Diagnosis not present

## 2018-06-22 DIAGNOSIS — K644 Residual hemorrhoidal skin tags: Secondary | ICD-10-CM | POA: Insufficient documentation

## 2018-06-22 DIAGNOSIS — K449 Diaphragmatic hernia without obstruction or gangrene: Secondary | ICD-10-CM | POA: Diagnosis not present

## 2018-06-22 DIAGNOSIS — Z801 Family history of malignant neoplasm of trachea, bronchus and lung: Secondary | ICD-10-CM | POA: Insufficient documentation

## 2018-06-22 DIAGNOSIS — Z833 Family history of diabetes mellitus: Secondary | ICD-10-CM | POA: Diagnosis not present

## 2018-06-22 DIAGNOSIS — D649 Anemia, unspecified: Secondary | ICD-10-CM

## 2018-06-22 DIAGNOSIS — K635 Polyp of colon: Secondary | ICD-10-CM

## 2018-06-22 DIAGNOSIS — D500 Iron deficiency anemia secondary to blood loss (chronic): Secondary | ICD-10-CM | POA: Insufficient documentation

## 2018-06-22 DIAGNOSIS — Z87891 Personal history of nicotine dependence: Secondary | ICD-10-CM | POA: Diagnosis not present

## 2018-06-22 DIAGNOSIS — Z1159 Encounter for screening for other viral diseases: Secondary | ICD-10-CM | POA: Diagnosis not present

## 2018-06-22 DIAGNOSIS — K648 Other hemorrhoids: Secondary | ICD-10-CM | POA: Insufficient documentation

## 2018-06-22 DIAGNOSIS — K621 Rectal polyp: Secondary | ICD-10-CM | POA: Insufficient documentation

## 2018-06-22 DIAGNOSIS — Z888 Allergy status to other drugs, medicaments and biological substances status: Secondary | ICD-10-CM | POA: Insufficient documentation

## 2018-06-22 DIAGNOSIS — D573 Sickle-cell trait: Secondary | ICD-10-CM | POA: Insufficient documentation

## 2018-06-22 DIAGNOSIS — D124 Benign neoplasm of descending colon: Secondary | ICD-10-CM | POA: Diagnosis not present

## 2018-06-22 DIAGNOSIS — R195 Other fecal abnormalities: Secondary | ICD-10-CM

## 2018-06-22 DIAGNOSIS — H409 Unspecified glaucoma: Secondary | ICD-10-CM | POA: Diagnosis not present

## 2018-06-22 DIAGNOSIS — D126 Benign neoplasm of colon, unspecified: Secondary | ICD-10-CM

## 2018-06-22 HISTORY — PX: BIOPSY: SHX5522

## 2018-06-22 HISTORY — PX: HOT HEMOSTASIS: SHX5433

## 2018-06-22 HISTORY — PX: ENTEROSCOPY: SHX5533

## 2018-06-22 HISTORY — PX: POLYPECTOMY: SHX5525

## 2018-06-22 HISTORY — PX: COLONOSCOPY: SHX5424

## 2018-06-22 LAB — SARS CORONAVIRUS 2 BY RT PCR (HOSPITAL ORDER, PERFORMED IN ~~LOC~~ HOSPITAL LAB): SARS Coronavirus 2: NEGATIVE

## 2018-06-22 SURGERY — ENTEROSCOPY
Anesthesia: Monitor Anesthesia Care

## 2018-06-22 SURGERY — ENTEROSCOPY
Anesthesia: Moderate Sedation

## 2018-06-22 MED ORDER — GLYCOPYRROLATE 0.2 MG/ML IJ SOLN
INTRAMUSCULAR | Status: DC | PRN
  Administered 2018-06-22: 0.2 mg via INTRAVENOUS

## 2018-06-22 MED ORDER — SODIUM CHLORIDE 0.9 % IV SOLN
INTRAVENOUS | Status: DC
  Administered 2018-06-22 (×2): via INTRAVENOUS

## 2018-06-22 MED ORDER — PROPOFOL 500 MG/50ML IV EMUL
INTRAVENOUS | Status: DC | PRN
  Administered 2018-06-22: 100 ug/kg/min via INTRAVENOUS

## 2018-06-22 MED ORDER — SODIUM CHLORIDE 0.9 % IV SOLN: INTRAVENOUS | Status: DC

## 2018-06-22 MED ORDER — LIDOCAINE HCL (CARDIAC) PF 100 MG/5ML IV SOSY
PREFILLED_SYRINGE | INTRAVENOUS | Status: DC | PRN
  Administered 2018-06-22: 40 mL via INTRATRACHEAL

## 2018-06-22 MED ORDER — PROPOFOL 10 MG/ML IV BOLUS: INTRAVENOUS | Status: DC | PRN

## 2018-06-22 NOTE — Discharge Instructions (Signed)
YOU HAD AN ENDOSCOPIC PROCEDURE TODAY: Refer to the procedure report and other information in the discharge instructions given to you for any specific questions about what was found during the examination. If this information does not answer your questions, please call [place] office at [phone] to clarify.  ° °YOU SHOULD EXPECT: Some feelings of bloating in the abdomen. Passage of more gas than usual. Walking can help get rid of the air that was put into your GI tract during the procedure and reduce the bloating. If you had a lower endoscopy (such as a colonoscopy or flexible sigmoidoscopy) you may notice spotting of blood in your stool or on the toilet paper. Some abdominal soreness may be present for a day or two, also. ° °DIET: Your first meal following the procedure should be a light meal and then it is ok to progress to your normal diet. A half-sandwich or bowl of soup is an example of a good first meal. Heavy or fried foods are harder to digest and may make you feel nauseous or bloated. Drink plenty of fluids but you should avoid alcoholic beverages for 24 hours. If you had a esophageal dilation, please see attached instructions for diet.   ° °ACTIVITY: Your care partner should take you home directly after the procedure. You should plan to take it easy, moving slowly for the rest of the day. You can resume normal activity the day after the procedure however YOU SHOULD NOT DRIVE, use power tools, machinery or perform tasks that involve climbing or major physical exertion for 24 hours (because of the sedation medicines used during the test).  ° °SYMPTOMS TO REPORT IMMEDIATELY: °A gastroenterologist can be reached at any hour. Please call [phone]  for any of the following symptoms:  °Following lower endoscopy (colonoscopy, flexible sigmoidoscopy) °Excessive amounts of blood in the stool  °Significant tenderness, worsening of abdominal pains  °Swelling of the abdomen that is new, acute  °Fever of 100° or  higher  °Following upper endoscopy (EGD, EUS, ERCP, esophageal dilation) °Vomiting of blood or coffee ground material  °New, significant abdominal pain  °New, significant chest pain or pain under the shoulder blades  °Painful or persistently difficult swallowing  °New shortness of breath  °Black, tarry-looking or red, bloody stools ° °FOLLOW UP:  °If any biopsies were taken you will be contacted by phone or by letter within the next 1-3 weeks. Call [phone]  if you have not heard about the biopsies in 3 weeks.  °Please also call with any specific questions about appointments or follow up tests. ° °

## 2018-06-22 NOTE — Op Note (Signed)
[facility] Patient Name: Andrew Guerra Procedure Date : 06/22/2018 MRN: [id number] Attending MD: Carlota Raspberry. Havery Moros , MD Date of Birth: 02/12/1940 CSN: [id number] Age: 78 Admit Type: Inpatient Procedure:                Colonoscopy Indications:              Heme positive stool, normocytic anemia Providers:                Remo Lipps P. Havery Moros, MD, Carlyn Reichert, RN, Ladona Ridgel, Technician Referring MD:              Medicines:                Monitored Anesthesia Care Complications:            No immediate complications. Estimated blood loss:                            Minimal. Estimated Blood Loss:     Estimated blood loss was minimal. Procedure:                Pre-Anesthesia Assessment:                           - Prior to the procedure, a History and Physical                            was performed, and patient medications and                            allergies were reviewed. The patient's tolerance of                            previous anesthesia was also reviewed. The risks                            and benefits of the procedure and the sedation                            options and risks were discussed with the patient.                            All questions were answered, and informed consent                            was obtained. Prior Anticoagulants: The patient has                            taken no previous anticoagulant or antiplatelet                            agents. ASA Grade Assessment: III - A patient with  severe systemic disease. After reviewing the risks                            and benefits, the patient was deemed in                            satisfactory condition to undergo the procedure.                           After obtaining informed consent, the colonoscope                            was passed under direct vision. Throughout the                            procedure, the  patient's blood pressure, pulse, and                            oxygen saturations were monitored continuously. The                            PCF-H190DL ([id number]) Olympus pediatric colonscope                            was introduced through the anus and advanced to the                            the terminal ileum, with identification of the                            appendiceal orifice and IC valve. The colonoscopy                            was performed without difficulty. The patient                            tolerated the procedure well. The quality of the                            bowel preparation was adequate. The terminal ileum,                            ileocecal valve, appendiceal orifice, and rectum                            were photographed. Scope In: 3:53:40 PM Scope Out: 4:31:47 PM Scope Withdrawal Time: 0 hours 33 minutes 46 seconds  Total Procedure Duration: 0 hours 38 minutes 7 seconds  Findings:      Skin tags were found on perianal exam.      Limited views of the terminal ileum appeared normal.      Two sessile polyps were found in the transverse colon. The polyps were 3       to 5 mm in size. These polyps were removed with a cold snare. Resection  and retrieval were complete.      Two sessile polyps were found in the descending colon. The polyps were 3       to 4 mm in size. These polyps were removed with a cold snare. Resection       and retrieval were complete.      Four sessile polyps were found in the recto-sigmoid colon. The polyps       were 3 to 4 mm in size. These polyps were removed with a cold snare.       Resection and retrieval were complete.      Anal papilla(e) were hypertrophied.      Internal hemorrhoids were found during retroflexion. The hemorrhoids       were moderate.      The colon prep was initially fair. Several minutes spent lavaging the       right colon to achieve adequate views. The colon did not retain air well       which  prolonged this exam. The exam was otherwise without abnormality. Impression:               - Perianal skin tags found on perianal exam.                           - The examined portion of the ileum was normal.                           - Two 3 to 5 mm polyps in the transverse colon,                            removed with a cold snare. Resected and retrieved.                           - Two 3 to 4 mm polyps in the descending colon,                            removed with a cold snare. Resected and retrieved.                           - Four 3 to 4 mm polyps at the recto-sigmoid colon,                            removed with a cold snare. Resected and retrieved.                           - Anal papilla(e) were hypertrophied.                           - Internal hemorrhoids.                           - The examination was otherwise normal.                           - Poor air retension which prolonged the procedure.  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 The signs and symptoms of potential                            delayed complications were discussed with the                            patient. Return to normal activities tomorrow.                            Written discharge instructions were provided to the                            patient.                           - Resume previous diet.                           - Continue present medications.                           - Await pathology results. Procedure Code(s):        --- Professional ---                           [phone], Colonoscopy, flexible; with removal of                            tumor(s), polyp(s), or other lesion(s) by snare                            technique Diagnosis Code(s):        --- Professional ---                            K64.8, Other hemorrhoids                           K63.5, Polyp of colon                           K62.89, Other specified diseases of anus and rectum                           K64.4, Residual hemorrhoidal skin tags                           R19.5, Other fecal abnormalities CPT copyright 2019 American Medical Association. All rights reserved. The codes documented in this  report are preliminary and upon coder review may  be revised to meet current compliance requirements. Remo Lipps P. Manjit Bufano, MD 06/22/2018 4:40:12 PM This report has been signed electronically. Number of Addenda: 0

## 2018-06-22 NOTE — Transfer of Care (Signed)
Immediate Anesthesia Transfer of Care Note  Patient: Andrew Guerra  Procedure(s) Performed: PUSH ENTEROSCOPY (N/A ) COLONOSCOPY (N/A ) HOT HEMOSTASIS (ARGON PLASMA COAGULATION/BICAP) (N/A ) BIOPSY POLYPECTOMY  Patient Location: Endoscopy Unit  Anesthesia Type:MAC  Level of Consciousness: awake, alert , oriented and patient cooperative  Airway & Oxygen Therapy: Patient Spontanous Breathing and Patient connected to nasal cannula oxygen  Post-op Assessment: Report given to RN and Post -op Vital signs reviewed and stable  Post vital signs: Reviewed and stable  Last Vitals:  Vitals Value Taken Time  BP 118/65 06/22/2018  4:42 PM  Temp    Pulse 97 06/22/2018  4:42 PM  Resp 22 06/22/2018  4:42 PM  SpO2 94 % 06/22/2018  4:42 PM    Last Pain:  Vitals:   06/22/18 1642  TempSrc:   PainSc: 0-No pain         Complications: No apparent anesthesia complications

## 2018-06-22 NOTE — Op Note (Signed)
[facility] Patient Name: Andrew Guerra Procedure Date : 06/22/2018 MRN: [id number] Attending MD: Carlota Raspberry. Havery Moros , MD Date of Birth: [birth year] CSN: [id number] Age: 78 Admit Type: Inpatient Procedure:                Small bowel enteroscopy Indications:              Anemia, heme positive stools, history of H pylori                            and GIM Providers:                Remo Lipps P. Havery Moros, MD, Carlyn Reichert, RN, Ladona Ridgel, Technician Referring MD:              Medicines:                Monitored Anesthesia Care Complications:            No immediate complications. Estimated blood loss:                            Minimal. Estimated Blood Loss:     Estimated blood loss was minimal. Procedure:                Pre-Anesthesia Assessment:                           - Prior to the procedure, a History and Physical                            was performed, and patient medications and                            allergies were reviewed. The patient's tolerance of                            previous anesthesia was also reviewed. The risks                            and benefits of the procedure and the sedation                            options and risks were discussed with the patient.                            All questions were answered, and informed consent                            was obtained. Prior Anticoagulants: The patient has                            taken no previous anticoagulant or antiplatelet                            agents.  ASA Grade Assessment: III - A patient with                            severe systemic disease. After reviewing the risks                            and benefits, the patient was deemed in                            satisfactory condition to undergo the procedure.                           After obtaining informed consent, the endoscope was                            passed under direct vision.  Throughout the                            procedure, the patient's blood pressure, pulse, and                            oxygen saturations were monitored continuously. The                            PCF-H190DL ([id number]) Olympus pediatric colonscope                            was introduced through the mouth and advanced to                            the proximal jejunum. The small bowel enteroscopy                            was accomplished without difficulty. The patient                            tolerated the procedure well. Scope In: Scope Out: Findings:      The examined esophagus was normal.      A medium to large-sized paraesophageal hiatal hernia was present. Mild       erythema noted within the hernia sac without ulceration.      Diffuse mildly erythematous mucosa was found in the gastric antrum.       Biopsies were taken with a cold forceps for histology from the antrum,       body, incisura due to the patient's history of H pylori and GIM.      The exam of the stomach was otherwise normal.      A few angiodysplastic lesions with no bleeding were found in the second       portion of the duodenum. Fulguration to ablate the lesion to prevent       bleeding by argon plasma was successful.      A few angiodysplastic lesions with no bleeding were found in the       proximal jejunum. Fulguration to ablate the lesion to prevent bleeding  by argon plasma was successful. 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 Impression:               - Normal esophagus.                           - Medium to large sized paraesophageal hiatal                            hernia.                           - Erythematous mucosa in the antrum. Biopsied.                           - A few non-bleeding angiodysplastic lesions in the                            duodenum. Treated  with argon plasma coagulation                            (APC).                           - A few non-bleeding angiodysplastic lesions in the                            jejunum. Treated with argon plasma coagulation                            (APC).                           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                            Patient is at risk for [facility] lesions given size                            of hernia but none seen today Recommendation:           - Discharge patient to home.                           - Resume previous diet.                           - Continue present medications.                           - Trend Hgb                           -  Await pathology results. Procedure Code(s):        --- Professional ---                           [phone], 35, Small intestinal endoscopy, enteroscopy                            beyond second portion of duodenum, not including                            ileum; with control of bleeding (eg, injection,                            bipolar cautery, unipolar cautery, laser, heater                            probe, stapler, plasma coagulator)                           [id number], Small intestinal endoscopy, enteroscopy                            beyond second portion of duodenum, not including                            ileum; with biopsy, single or multiple Diagnosis Code(s):        --- Professional ---                           K44.9, Diaphragmatic hernia without obstruction or                            gangrene                           K31.89, Other diseases of stomach and duodenum                           K31.819, Angiodysplasia of stomach and duodenum                            without bleeding                           K55.20, Angiodysplasia of colon without hemorrhage                           D64.9, Anemia, unspecified CPT copyright  2019 American Medical Association. All rights reserved. The codes documented in this report are preliminary and upon coder review may  be revised to meet current compliance requirements. Remo Lipps P. Jeronimo Hellberg, MD 06/22/2018 4:51:59 PM This report has been signed electronically. Number of Addenda: 0

## 2018-06-22 NOTE — Anesthesia Procedure Notes (Addendum)
Procedure Name: MAC Date/Time: 06/22/2018 3:21 PM Performed by: Oletta Lamas, CRNA Pre-anesthesia Checklist: Patient identified, Emergency Drugs available, Suction available, Patient being monitored and Timeout performed Patient Re-evaluated:Patient Re-evaluated prior to induction Oxygen Delivery Method: Nasal cannula Airway Equipment and Method: Oral airway

## 2018-06-22 NOTE — Interval H&P Note (Signed)
History and Physical Interval Note:  06/22/2018 5:19 PM  Andrew Guerra  has presented today for surgery, with the diagnosis of GI blood loss anemia.  The various methods of treatment have been discussed with the patient and family. After consideration of risks, benefits and other options for treatment, the patient has consented to  Procedure(s): PUSH ENTEROSCOPY (N/A) COLONOSCOPY (N/A) HOT HEMOSTASIS (ARGON PLASMA COAGULATION/BICAP) (N/A) BIOPSY POLYPECTOMY as a surgical intervention.  The patient's history has been reviewed, patient examined, no change in status, stable for surgery.  I have reviewed the patient's chart and labs.  Questions were answered to the patient's satisfaction.     [place]

## 2018-06-22 NOTE — Anesthesia Procedure Notes (Signed)
Date/Time: 06/22/2018 4:51 PM Performed by: Oletta Lamas, CRNA Oxygen Delivery Method: Simple face mask

## 2018-06-22 NOTE — Anesthesia Preprocedure Evaluation (Addendum)
Anesthesia Evaluation  Patient identified by MRN, date of birth, ID band Patient awake    Reviewed: Allergy & Precautions, NPO status , Patient's Chart, lab work & pertinent test results  Airway Mallampati: I  TM Distance: >3 FB Neck ROM: Full    Dental  (+) Edentulous Upper, Edentulous Lower   Pulmonary former smoker,    breath sounds clear to auscultation       Cardiovascular hypertension, Pt. on medications + Valvular Problems/Murmurs AS  Rhythm:Regular Rate:Normal     Neuro/Psych negative neurological ROS  negative psych ROS   GI/Hepatic Neg liver ROS, hiatal hernia, PUD, GERD  Medicated,  Endo/Other  negative endocrine ROS  Renal/GU Renal disease     Musculoskeletal negative musculoskeletal ROS (+)   Abdominal Normal abdominal exam  (+)   Peds  Hematology negative hematology ROS (+)   Anesthesia Other Findings   Reproductive/Obstetrics                            Lab Results  Component Value Date   WBC 9.6 05/29/2018   HGB 10.8 (L) 06/20/2018   HCT 34.3 (L) 05/29/2018   MCV 83.5 05/29/2018   PLT 244 05/29/2018   Echo: - Left ventricle: The cavity size was normal. Systolic function was   normal. The estimated ejection fraction was in the range of 60%   to 65%. Wall motion was normal; there were no regional wall   motion abnormalities. Doppler parameters are consistent with   abnormal left ventricular relaxation (grade 1 diastolic   dysfunction). Stroke volume/bsa: 48 ml/m^2. - Ventricular septum: Leftward ventricular septal shift in systole   with D-shaped septum suggests RV pressure overload. - Aortic valve: Mildly thickened, moderately calcified leaflets.   Valve mobility was moderately restricted. There was mild to   moderate stenosis. There was trivial regurgitation. Mean gradient   (S): 22 mm Hg. Valve previously described as bicuspid, unable to   define valve leaflets on  today&'s exam due to calcification and   reduced mobility. - Mitral valve: There was trivial regurgitation. - Left atrium: The atrium was normal in size. - Right ventricle: The cavity size was moderately to severely   dilated. Wall thickness was normal. Systolic function was   moderately reduced. RV systolic pressure (S, est): 52 mm Hg. - Right atrium: The atrium was mildly dilated. - Tricuspid valve: There was mild regurgitation. - Pulmonary arteries: Systolic pressure was increased. PA peak   pressure: 52 mm Hg (S). - Inferior vena cava: The vessel was dilated. The respirophasic   diameter changes were blunted (< 50%), consistent with elevated   central venous pressure. - Pericardium, extracardiac: A small pericardial effusion was   identified primarly at the apex, with trivial circumferential   extension.  Anesthesia Physical Anesthesia Plan  ASA: III  Anesthesia Plan: MAC   Post-op Pain Management:    Induction: Intravenous  PONV Risk Score and Plan: 1 and Propofol infusion  Airway Management Planned: Natural Airway and Nasal Cannula  Additional Equipment: None  Intra-op Plan:   Post-operative Plan:   Informed Consent: I have reviewed the patients History and Physical, chart, labs and discussed the procedure including the risks, benefits and alternatives for the proposed anesthesia with the patient or authorized representative who has indicated his/her understanding and acceptance.       Plan Discussed with: CRNA  Anesthesia Plan Comments:        Anesthesia Quick Evaluation

## 2018-06-22 NOTE — H&P (Signed)
HPI :  78 y/o male with anemia and heme positive stools set for EGD and colonoscopy at [place] today - unfortunately they did not realized he uses home oxygen and not able to have a procedure there. We were able to accomodate him and do this procedure at the hospital.  Past Medical History:  Diagnosis Date  . 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  [name] Cataract    right eye - surgery to remove  . Elevated PSA 11/29/2016   Patient is followed by [facility].  Most recent PSA near 11.  He has had atypia on biopsy.  November 2008  . Essential hypertension, benign 06/08/2012  . Full dentures   . Glaucoma   . Heart murmur    since childhood, never has caused any problems  . History of rheumatic fever 11/04/2015  . Hyperlipidemia 04/25/2013  . Hypertension   . Malignant neoplasm of left lung ([place]) 02/08/2017  . RBBB 07/12/2017  . Sickle cell trait ([place])    no problems per patient     Past Surgical History:  Procedure Laterality Date  . BIOPSY  11/11/2017   Procedure: BIOPSY;  Surgeon: Lavena Bullion, DO;  Location: [facility];  Service: Gastroenterology;;  . BIOPSY  01/10/2018   Procedure: BIOPSY;  Surgeon: [name], MD;  Location: [facility];  Service: Gastroenterology;;  . cataract eye surgery Right   . COLONOSCOPY  11/2009   hx polyps/[place]  . ESOPHAGOGASTRODUODENOSCOPY N/A 11/11/2017   Procedure: ESOPHAGOGASTRODUODENOSCOPY (EGD);  Surgeon: Lavena Bullion, DO;  Location[facility];  Service: Gastroenterology;  Laterality: N/A;  . ESOPHAGOGASTRODUODENOSCOPY (EGD) WITH PROPOFOL N/A 01/10/2018   Procedure: ESOPHAGOGASTRODUODENOSCOPY (EGD) WITH PROPOFOL;  Surgeon: [name], MD;  Location: [facility];  Service: Gastroenterology;  Laterality: N/A;  . IR FLUORO GUIDE CV LINE RIGHT  01/16/2018  . IR FLUORO GUIDE CV LINE RIGHT   01/31/2018  . IR US GUIDE VASC ACCESS RIGHT  01/16/2018  . IR US GUIDE VASC ACCESS RIGHT  01/31/2018  . PROSTATE BIOPSY    . right eye surgery     to lower eye pressure  . RIGHT HEART CATH N/A 01/19/2018   Procedure: RIGHT HEART CATH;  Surgeon: Nelva Bush, MD;  Location: [facility] CV [facility];  Service: Cardiovascular;  Laterality: N/A;  . TONSILLECTOMY    . VIDEO ASSISTED THORACOSCOPY (VATS)/ LOBECTOMY Right 12/26/2015  . wisdom tteeth ext     Family History  Problem Relation Age of Onset  . Diabetes Mother   . Cerebral aneurysm Mother   . Lung cancer Father   . Diabetes Sister   . Colon cancer Neg Hx   . Colon polyps Neg Hx   . Esophageal cancer Neg Hx   . Rectal cancer Neg Hx   . Stomach cancer Neg Hx    Social History   Tobacco Use  . Smoking status: Former Smoker    Packs/day: 1.00    Types: Cigarettes    Last attempt to quit: 01/19/2011    Years since quitting: 7.4  . Smokeless tobacco: Never Used  Substance Use Topics  . Alcohol use: Yes    Alcohol/week: 1.0 standard drinks    Types: 1 Shots of liquor per week    Comment: rarely  . Drug use: No   Current Facility-Administered Medications  Medication Dose Route Frequency Provider Last Rate Last Dose  .  0.9 %  sodium chloride infusion   Intravenous Continuous Parnika Tweten, Carlota Raspberry, MD   Stopped at 06/22/18 1652  . 0.9 %  sodium chloride infusion   Intravenous Continuous Ilir Mahrt, Carlota Raspberry, MD       Facility-Administered Medications Ordered in Other Encounters  Medication Dose Route Frequency Provider Last Rate Last Dose  . glycopyrrolate (ROBINUL) injection    Anesthesia Intra-op Oletta Lamas, CRNA   0.2 mg at 06/22/18 1540  . lidocaine (cardiac) 100 mg/8mL (XYLOCAINE) injection 2%   Tracheal Tube Anesthesia Intra-op Oletta Lamas, CRNA   40 mL at 06/22/18 1522  . propofol (DIPRIVAN) 500 MG/50ML infusion    Continuous PRN Oletta Lamas, CRNA   Stopped at 06/22/18 1623   Allergies  Allergen Reactions   . Ambien [Zolpidem Tartrate] Other (See Comments)    Has hallucinations and Homicidal Ideations per Wife   . Atorvastatin     Achy and tired     Review of Systems: All systems reviewed and negative except where noted in HPI.     Physical Exam: BP (!) 152/84   Pulse 92   Temp 97.7 F (36.5 C) (Temporal)   Resp (!) 24   SpO2 99%  Constitutional: Pleasant,well-developed, male in no acute distress. Cardiovascular: Normal rate, regular rhythm.  Pulmonary/chest: Effort normal and breath sounds normal. No wheezing, rales or rhonchi. Abdominal: Soft, nondistended, nontender.  There are no masses palpable. No hepatomegaly. Extremities: no edema  ASSESSMENT AND PLAN: 78 y/o male here for heme positive stools and anemia to have enteroscopy and colonoscopy, to be done at hospital in light of oxygen use. Have discussed risks / benefit of anesthesia and the procedures and he wishes to proceed.   Allport Cellar, MD [facility]

## 2018-06-23 NOTE — Anesthesia Postprocedure Evaluation (Signed)
Anesthesia Post Note  Patient: Andrew Guerra  Procedure(s) Performed: PUSH ENTEROSCOPY (N/A ) COLONOSCOPY (N/A ) HOT HEMOSTASIS (ARGON PLASMA COAGULATION/BICAP) (N/A ) BIOPSY POLYPECTOMY     Patient location during evaluation: PACU Anesthesia Type: MAC Level of consciousness: awake and alert Pain management: pain level controlled Vital Signs Assessment: post-procedure vital signs reviewed and stable Respiratory status: spontaneous breathing, nonlabored ventilation and respiratory function stable Cardiovascular status: stable and blood pressure returned to baseline Anesthetic complications: no    Last Vitals:  Vitals:   06/22/18 1651 06/22/18 1700  BP: (!) 141/80 (!) 152/84  Pulse: 93 92  Resp: (!) 21 (!) 24  Temp:    SpO2: 97% 99%    Last Pain:  Vitals:   06/22/18 1700  TempSrc:   PainSc: 0-No pain                 Audry Pili

## 2018-06-26 ENCOUNTER — Other Ambulatory Visit: Payer: Self-pay

## 2018-06-26 ENCOUNTER — Ambulatory Visit (HOSPITAL_COMMUNITY)
Admission: RE | Admit: 2018-06-26 | Discharge: 2018-06-26 | Disposition: A | Discharge status: Home / Self Care | Payer: Medicare Other | Source: Ambulatory Visit | Attending: Nephrology | Admitting: Nephrology

## 2018-06-26 VITALS — BP 149/87 | HR 91 | Temp 97.9°F | Resp 20

## 2018-06-26 DIAGNOSIS — D508 Other iron deficiency anemias: Secondary | ICD-10-CM | POA: Insufficient documentation

## 2018-06-26 LAB — CBC WITH DIFFERENTIAL/PLATELET
Abs Immature Granulocytes: 0.03 10*3/uL (ref 0.00–0.07)
Basophils Absolute: 0 10*3/uL (ref 0.0–0.1)
Basophils Relative: 0 %
Eosinophils Absolute: 0.1 10*3/uL (ref 0.0–0.5)
Eosinophils Relative: 1 %
HCT: 39.8 % (ref 39.0–52.0)
Hemoglobin: 12.2 g/dL — ABNORMAL LOW (ref 13.0–17.0)
Immature Granulocytes: 0 %
Lymphocytes Relative: 16 %
Lymphs Abs: 1.2 10*3/uL (ref 0.7–4.0)
MCH: 24 pg — ABNORMAL LOW (ref 26.0–34.0)
MCHC: 30.7 g/dL (ref 30.0–36.0)
MCV: 78.3 fL — ABNORMAL LOW (ref 80.0–100.0)
Monocytes Absolute: 0.7 10*3/uL (ref 0.1–1.0)
Monocytes Relative: 9 %
Neutro Abs: 5.7 10*3/uL (ref 1.7–7.7)
Neutrophils Relative %: 74 %
Platelets: 178 10*3/uL (ref 150–400)
RBC: 5.08 MIL/uL (ref 4.22–5.81)
RDW: 26.4 % — ABNORMAL HIGH (ref 11.5–15.5)
WBC: 7.8 10*3/uL (ref 4.0–10.5)
nRBC: 0 % (ref 0.0–0.2)

## 2018-06-26 LAB — RENAL FUNCTION PANEL
Albumin: 2 g/dL — ABNORMAL LOW (ref 3.5–5.0)
Anion gap: 9 (ref 5–15)
BUN: 48 mg/dL — ABNORMAL HIGH (ref 8–23)
CO2: 25 mmol/L (ref 22–32)
Calcium: 8.4 mg/dL — ABNORMAL LOW (ref 8.9–10.3)
Chloride: 107 mmol/L (ref 98–111)
Creatinine, Ser: 2.23 mg/dL — ABNORMAL HIGH (ref 0.61–1.24)
GFR calc Af Amer: 31 mL/min — ABNORMAL LOW (ref >60)
GFR calc non Af Amer: 27 mL/min — ABNORMAL LOW (ref >60)
Glucose, Bld: 112 mg/dL — ABNORMAL HIGH (ref 70–99)
Phosphorus: 2.8 mg/dL (ref 2.5–4.6)
Potassium: 3.9 mmol/L (ref 3.5–5.1)
Sodium: 141 mmol/L (ref 135–145)

## 2018-06-26 LAB — POCT HEMOGLOBIN-HEMACUE: Hemoglobin: 12.1 g/dL — ABNORMAL LOW (ref 13.0–17.0)

## 2018-06-26 LAB — MAGNESIUM: Magnesium: 2 mg/dL (ref 1.7–2.4)

## 2018-06-26 LAB — IRON AND TIBC
Iron: 27 ug/dL — ABNORMAL LOW (ref 45–182)
Saturation Ratios: 11 % — ABNORMAL LOW (ref 17.9–39.5)
TIBC: 242 ug/dL — ABNORMAL LOW (ref 250–450)
UIBC: 215 ug/dL

## 2018-06-26 LAB — FERRITIN: Ferritin: 387 ng/mL — ABNORMAL HIGH (ref 24–336)

## 2018-06-26 MED ORDER — EPOETIN ALFA-EPBX 40000 UNIT/ML IJ SOLN: 40000.0000 [IU] | INTRAMUSCULAR | Status: DC

## 2018-06-26 MED ORDER — EPOETIN ALFA-EPBX 10000 UNIT/ML IJ SOLN
40000.0000 [IU] | Freq: Once | INTRAMUSCULAR | Status: DC
  Filled 2018-06-26: qty 4

## 2018-06-27 ENCOUNTER — Encounter (HOSPITAL_COMMUNITY): Payer: Medicare Other

## 2018-06-29 ENCOUNTER — Encounter: Payer: Self-pay | Admitting: Gastroenterology

## 2018-06-30 ENCOUNTER — Other Ambulatory Visit: Payer: Self-pay | Admitting: Nurse Practitioner

## 2018-07-01 ENCOUNTER — Encounter: Payer: Self-pay | Admitting: Family Medicine

## 2018-07-03 ENCOUNTER — Encounter: Payer: Self-pay | Admitting: Family Medicine

## 2018-07-03 ENCOUNTER — Encounter (HOSPITAL_COMMUNITY): Payer: Medicare Other

## 2018-07-09 ENCOUNTER — Telehealth: Payer: Self-pay | Admitting: Family Medicine

## 2018-07-09 NOTE — Telephone Encounter (Signed)
Patient is having swelling issues in his legs into the scrotal area He is followed by Dr. Posey Pronto with [facility] Please let me speak with Dr. Posey Pronto or with his PA/nurse practitioner or nurse etc. regarding this patient

## 2018-07-10 ENCOUNTER — Other Ambulatory Visit: Payer: Self-pay

## 2018-07-10 ENCOUNTER — Encounter (HOSPITAL_COMMUNITY)
Admission: RE | Admit: 2018-07-10 | Discharge: 2018-07-10 | Disposition: A | Discharge status: Home / Self Care | Payer: Medicare Other | Source: Ambulatory Visit | Attending: Nephrology | Admitting: Nephrology

## 2018-07-10 VITALS — BP 177/106 | HR 93 | Temp 98.2°F

## 2018-07-10 DIAGNOSIS — N184 Chronic kidney disease, stage 4 (severe): Secondary | ICD-10-CM | POA: Insufficient documentation

## 2018-07-10 DIAGNOSIS — D631 Anemia in chronic kidney disease: Secondary | ICD-10-CM | POA: Diagnosis not present

## 2018-07-10 DIAGNOSIS — D508 Other iron deficiency anemias: Secondary | ICD-10-CM

## 2018-07-10 LAB — POCT HEMOGLOBIN-HEMACUE: Hemoglobin: 12.9 g/dL — ABNORMAL LOW (ref 13.0–17.0)

## 2018-07-10 MED ORDER — EPOETIN ALFA-EPBX 40000 UNIT/ML IJ SOLN: 40000.0000 [IU] | INTRAMUSCULAR | Status: DC

## 2018-07-12 ENCOUNTER — Encounter: Payer: Self-pay | Admitting: *Deleted

## 2018-07-12 ENCOUNTER — Other Ambulatory Visit: Payer: Self-pay | Admitting: Family Medicine

## 2018-07-12 MED ORDER — TORSEMIDE 100 MG PO TABS: 100.0000 mg | ORAL_TABLET | Freq: Every day | ORAL | 5 refills | Status: DC

## 2018-07-12 MED ORDER — METOLAZONE 2.5 MG PO TABS: ORAL_TABLET | ORAL | 0 refills | Status: DC

## 2018-07-12 NOTE — Telephone Encounter (Addendum)
Discussed with wife(DPR) who asked for the information to be sent thru my chart. Message was sent thru my chart with the information. Wife verbalized understanding

## 2018-07-12 NOTE — Telephone Encounter (Signed)
Also the patient's nephrologist wanted him to be on Metolazone 2.5 mg This is also a diuretic and it is only to be used twice a week I recommend 1 on Monday 1 on Friday The new prescription for metolazone and torsemide were sent to his pharmacy in [place] If any questions please let me know

## 2018-07-12 NOTE — Telephone Encounter (Signed)
Please notify the patient that I did speak with his kidney doctor-Dr. Posey Pronto They recommend increasing the diuretic 200 mg torsemide taken twice daily Their office will be calling Andrew Guerra to set up a follow-up appointment in the near future with them and they will recheck his kidney function at that time Patient should do a virtual follow-up with [name] every 3 months Patient can certainly let me know if he is having any concerns or questions

## 2018-07-13 ENCOUNTER — Other Ambulatory Visit: Payer: Self-pay | Admitting: Family Medicine

## 2018-07-13 ENCOUNTER — Telehealth: Payer: Self-pay | Admitting: Family Medicine

## 2018-07-13 NOTE — Telephone Encounter (Signed)
Nurses please call the patient Earlier this week I did speak with Dr. Posey Pronto regarding the patient swelling in the legs as well as scrotal swelling Dr. Posey Pronto recommended increasing the diuretic He also recommended a second diuretic 2 days a week Please see message I forwarded a message Nurses forwarded a message via my chart to the patient Please make sure the patient understands the directions Talk with the patient make sure he understands Feel free to shoot me any additional questions the patient has I can make time later in the day to call the patient Right at the moment very busy

## 2018-07-13 NOTE — Telephone Encounter (Signed)
Instructions reviewed again with patient . Patient read back instructions to me and verbalized understanding and stated he is following up with Dr Posey Pronto 07/30/2018

## 2018-07-13 NOTE — Telephone Encounter (Signed)
Patient calling about torsemide (DEMADEX) 100 MG tablet that was called in yesterday. He is saying Dr. Posey Pronto has been giving him this at 20 mg, 3 in the am and 3 in the pm. And We prescribed 1 daily at 100 mg.  Please advise.

## 2018-07-13 NOTE — Telephone Encounter (Signed)
Information was explained to wife(DPR) and wife also requested message to be sent thru my chart so they have instructions printed out and message was sent thru my chart

## 2018-07-17 ENCOUNTER — Encounter (HOSPITAL_COMMUNITY): Payer: Medicare Other

## 2018-07-24 ENCOUNTER — Encounter (HOSPITAL_COMMUNITY)
Admission: RE | Admit: 2018-07-24 | Discharge: 2018-07-24 | Disposition: A | Discharge status: Home / Self Care | Payer: Medicare Other | Source: Ambulatory Visit | Attending: Nephrology | Admitting: Nephrology

## 2018-07-24 ENCOUNTER — Other Ambulatory Visit: Payer: Self-pay

## 2018-07-24 VITALS — BP 165/96 | HR 85 | Temp 97.5°F | Resp 20

## 2018-07-24 DIAGNOSIS — D508 Other iron deficiency anemias: Secondary | ICD-10-CM

## 2018-07-24 DIAGNOSIS — N184 Chronic kidney disease, stage 4 (severe): Secondary | ICD-10-CM | POA: Diagnosis not present

## 2018-07-24 DIAGNOSIS — D631 Anemia in chronic kidney disease: Secondary | ICD-10-CM | POA: Insufficient documentation

## 2018-07-24 LAB — CBC WITH DIFFERENTIAL/PLATELET
Abs Immature Granulocytes: 0.02 10*3/uL (ref 0.00–0.07)
Basophils Absolute: 0 10*3/uL (ref 0.0–0.1)
Basophils Relative: 0 %
Eosinophils Absolute: 0.2 10*3/uL (ref 0.0–0.5)
Eosinophils Relative: 3 %
HCT: 37.9 % — ABNORMAL LOW (ref 39.0–52.0)
Hemoglobin: 11.8 g/dL — ABNORMAL LOW (ref 13.0–17.0)
Immature Granulocytes: 0 %
Lymphocytes Relative: 22 %
Lymphs Abs: 1.7 10*3/uL (ref 0.7–4.0)
MCH: 23.6 pg — ABNORMAL LOW (ref 26.0–34.0)
MCHC: 31.1 g/dL (ref 30.0–36.0)
MCV: 75.6 fL — ABNORMAL LOW (ref 80.0–100.0)
Monocytes Absolute: 0.7 10*3/uL (ref 0.1–1.0)
Monocytes Relative: 9 %
Neutro Abs: 5.2 10*3/uL (ref 1.7–7.7)
Neutrophils Relative %: 66 %
Platelets: 195 10*3/uL (ref 150–400)
RBC: 5.01 MIL/uL (ref 4.22–5.81)
RDW: 24.5 % — ABNORMAL HIGH (ref 11.5–15.5)
WBC: 7.8 10*3/uL (ref 4.0–10.5)
nRBC: 0 % (ref 0.0–0.2)

## 2018-07-24 LAB — IRON AND TIBC
Iron: 30 ug/dL — ABNORMAL LOW (ref 45–182)
Saturation Ratios: 11 % — ABNORMAL LOW (ref 17.9–39.5)
TIBC: 272 ug/dL (ref 250–450)
UIBC: 242 ug/dL

## 2018-07-24 LAB — RENAL FUNCTION PANEL
Albumin: 2.2 g/dL — ABNORMAL LOW (ref 3.5–5.0)
Anion gap: 11 (ref 5–15)
BUN: 79 mg/dL — ABNORMAL HIGH (ref 8–23)
CO2: 32 mmol/L (ref 22–32)
Calcium: 8.6 mg/dL — ABNORMAL LOW (ref 8.9–10.3)
Chloride: 96 mmol/L — ABNORMAL LOW (ref 98–111)
Creatinine, Ser: 2.76 mg/dL — ABNORMAL HIGH (ref 0.61–1.24)
GFR calc Af Amer: 24 mL/min — ABNORMAL LOW (ref >60)
GFR calc non Af Amer: 21 mL/min — ABNORMAL LOW (ref >60)
Glucose, Bld: 90 mg/dL (ref 70–99)
Phosphorus: 3 mg/dL (ref 2.5–4.6)
Potassium: 3.3 mmol/L — ABNORMAL LOW (ref 3.5–5.1)
Sodium: 139 mmol/L (ref 135–145)

## 2018-07-24 LAB — FERRITIN: Ferritin: 317 ng/mL (ref 24–336)

## 2018-07-24 LAB — POCT HEMOGLOBIN-HEMACUE: Hemoglobin: 12 g/dL — ABNORMAL LOW (ref 13.0–17.0)

## 2018-07-24 LAB — MAGNESIUM: Magnesium: 1.9 mg/dL (ref 1.7–2.4)

## 2018-07-24 MED ORDER — EPOETIN ALFA-EPBX 40000 UNIT/ML IJ SOLN
40000.0000 [IU] | INTRAMUSCULAR | Status: DC
  Filled 2018-07-24: qty 1

## 2018-07-24 MED ORDER — EPOETIN ALFA-EPBX 10000 UNIT/ML IJ SOLN
10000.0000 [IU] | INTRAMUSCULAR | Status: DC
  Filled 2018-07-24: qty 1

## 2018-07-24 MED ORDER — EPOETIN ALFA-EPBX 40000 UNIT/ML IJ SOLN: 40000.0000 [IU] | INTRAMUSCULAR | Status: DC

## 2018-07-25 NOTE — Progress Notes (Addendum)
TELEHEALTH VISIT  Referring Provider: [name], MD Primary Care Physician:  [name], MD   Tele-visit due to COVID-19 pandemic Patient requested visit virtually, consented to the virtual encounter via voice enabled telemedicine application Contact made at: 13:30 07/27/18 Patient verified by name and date of birth Location of patient: Home Location provider: [facility] Names of persons participating: Me, patient, wife (on speaker phone), Tinnie Gens CMA Time spent on telehealth visit: 28 minutes I discussed the limitations of evaluation and management by telemedicine. The patient expressed understanding and agreed to proceed.  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: Gastric and duodenal ulcers    - initially diagnosed as source of bleeding 10/2017     - H pylori + 10/2017       - treated with bismuth, metronidazole, doxycycline, PPI       - subsequent gastric biopsies 01/10/18 negative for H pylori     - no PUD seen on EGD 06/22/18 Intestinal metaplasia    - intestinal metaplasia on biopsies 10/10 and after H pylori irradication       - patient not at high risk for gastric cancer, no family history of stomach cancer       - no surveillance indicated based on AGA guidelines LA class A reflux esophagitis on EGD 01/10/18 Duodenal nodule seen on EGD 10/2017 ([place])    - Biopsy 10/2017 showed benign mucosa    - No duodenal or pancreatic abnormalities identified on MRI/MRCP planned    - EUS with Dr. 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 Clinically improved after ablation on iron supplements.  We reviewed the pathology results from his recent endoscopy.  Given age don't think he warrants another colonoscopy.  Focal intestinal metaplasia again seen on recent gastric biopsies. No evidence for ongoing H pylori. Andrew Guerra is not at high risk for gastric cancer, no family history of stomach cancer. No surveillance indicated at this time based on new AGA guidelines.   PLAN: Continue pantoprazole 40 mg BID, reduce to daily dose Use Miralax 17g daily PRN for constipation Consider reducing the frequency of hemoglobin lab draws given his recent stability Consider repeat push enteroscopy +/- virtual capsule endoscopy with recurrent, progressive anemia +/- melena  Please see the "Patient Instructions" section for addition details about the plan.  HPI: Andrew Guerra is a 78 y.o. male under evaluation for GI blood loss anemia. The interval history is obtained through the patient, his wife, and review of his electronic health record.  Push enteroscopy 06/22/18 with Dr. Havery Moros showed seven AVMs in the duodenum and proximal jejunum treated with APC, a large paraesophageal hernia, and gastritis with focal intestinal metaplasia.  Colonoscopy 06/22/18 showed 8 small colon polyps - two of which were adenomas, internal hemorrhoids, and hypertrophied anal papillae.   Reports some ongoing constipation. He was having one formed BM daily  after the colonoscopy. Dramatic change last week. Stool is now hard. He has used two doses of Miralax without significant improvement. He has a good appetite. Weight is stable.     No blood in the stools or melena since his endoscopy. No other ongoing GI symptoms.  Notes that he now understands the feeling of fatigue that  accompanies significant anemia.  His energy levels have been good.  Labs 05/01/18: iron 14, ferritin 79 Labs 05/15/18: Hemoglobin 6.0, received 2 units PRBCs 05/22/18: Hemoglobin 8.7 06/20/18: Hemoglobin 10.8 06/26/18: Hemoglobin 12.2 07/10/18: Hemoglobin 12.9 07/24/18: Hemoglobin 12.0    No ongoing GI symptoms except for constipation. No overt GI blood loss. No diarrhea. No abdominal pain, nausea, or vomiting. No melena, hematochezia, bright red blood per rectum. No epistaxis, hemoptysis, or hematuria.   Prior abdominal imaging: MRI/MRCP 12/09/17 showed hepatic iron deposition, tiny cysts and/or biliary hamartomas, no biliary dilatation, several renal lesions including possible hemorrhagic cysts. CT abd/pelvis without contrast 01/09/18 showed proteinaceous or hemorrhagic cysts in the kidneys, no GI findings  Past Medical History:  Diagnosis Date  . 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  [name] Cataract    right eye - surgery to remove  . Elevated PSA 11/29/2016   Patient is followed by [facility].  Most recent PSA near 11.  He has had atypia on biopsy.  November 2008  . Essential hypertension, benign 06/08/2012  . Full dentures   . Glaucoma   . Heart murmur    since childhood, never has caused any problems  . History of rheumatic fever 11/04/2015  . Hyperlipidemia 04/25/2013  . Hypertension   . Malignant neoplasm of left lung ([place]) 02/08/2017  . RBBB 07/12/2017  . Sickle cell trait ([place])    no problems per patient    Past Surgical History:  Procedure Laterality Date  . BIOPSY  11/11/2017   Procedure: BIOPSY;  Surgeon: Lavena Bullion, DO;  Location: [facility] ENDOSCOPY;  Service: Gastroenterology;;  . BIOPSY  01/10/2018   Procedure: BIOPSY;  Surgeon: [name], MD;  Location: [facility];  Service: Gastroenterology;;  . BIOPSY  06/22/2018   Procedure: BIOPSY;   Surgeon: Yetta Flock, MD;  Location: [facility] ENDOSCOPY;  Service: Gastroenterology;;  . cataract eye surgery Right   . COLONOSCOPY  11/2009   hx polyps/[place]  . COLONOSCOPY N/A 06/22/2018   Procedure: COLONOSCOPY;  Surgeon: Yetta Flock, MD;  Location: [facility];  Service: Gastroenterology;  Laterality: N/A;  . ENTEROSCOPY N/A 06/22/2018   Procedure: PUSH ENTEROSCOPY;  Surgeon: Yetta Flock, MD;  Location: [facility] ENDOSCOPY;  Service: Gastroenterology;  Laterality: N/A;  . ESOPHAGOGASTRODUODENOSCOPY N/A 11/11/2017   Procedure: ESOPHAGOGASTRODUODENOSCOPY (EGD);  Surgeon: Lavena Bullion, DO;  Location: [facility] ENDOSCOPY;  Service: Gastroenterology;  Laterality: N/A;  . ESOPHAGOGASTRODUODENOSCOPY (EGD) WITH PROPOFOL N/A 01/10/2018   Procedure: ESOPHAGOGASTRODUODENOSCOPY (EGD) WITH PROPOFOL;  Surgeon: [name], MD;  Location: [facility];  Service: Gastroenterology;  Laterality: N/A;  . HOT HEMOSTASIS N/A 06/22/2018   Procedure: HOT HEMOSTASIS (ARGON PLASMA COAGULATION/BICAP);  Surgeon: Yetta Flock, MD;  Location: [facility] ENDOSCOPY;  Service: Gastroenterology;  Laterality: N/A;  . IR FLUORO GUIDE CV LINE RIGHT  01/16/2018  . IR FLUORO GUIDE CV LINE RIGHT  01/31/2018  . IR US GUIDE VASC ACCESS RIGHT  01/16/2018  . IR US GUIDE VASC ACCESS RIGHT  01/31/2018  . POLYPECTOMY  06/22/2018  Procedure: POLYPECTOMY;  Surgeon: Yetta Flock, MD;  Location: [place];  Service: Gastroenterology;;  . PROSTATE BIOPSY    . right eye surgery     to lower eye pressure  . RIGHT HEART CATH N/A 01/19/2018   Procedure: RIGHT HEART CATH;  Surgeon: Nelva Bush, MD;  Location: [facility] CV LAB;  Service: Cardiovascular;  Laterality: N/A;  . TONSILLECTOMY    . VIDEO ASSISTED THORACOSCOPY (VATS)/ LOBECTOMY Right 12/26/2015  . wisdom tteeth ext      Current Outpatient Medications  Medication Sig Dispense Refill  . amLODipine (NORVASC) 2.5 MG tablet TAKE 1 TABLET BY MOUTH EVERY  DAY 30 tablet 0  . Brinzolamide-Brimonidine (SIMBRINZA) 1-0.2 % SUSP Place 2-3 drops into both eyes 3 (three) times daily.     . budesonide-formoterol (SYMBICORT) 80-4.5 MCG/ACT inhaler Inhale 2 puffs into the lungs as needed.    . cholecalciferol (VITAMIN D3) 25 MCG (1000 UT) tablet Take 1,000 Units by mouth daily.    . metolazone (ZAROXOLYN) 2.5 MG tablet 1 on Monday 1 on Friday 8 tablet 0  . Multiple Vitamin (MULTIVITAMIN) tablet Take 1 tablet by mouth daily. With vitamin D    . Netarsudil-Latanoprost (ROCKLATAN) 0.02-0.005 % SOLN Place 1 drop into both eyes at bedtime.     . Nutritional Supplements (FEEDING SUPPLEMENT, NEPRO CARB STEADY,) LIQD Take 237 mLs by mouth 2 (two) times daily.    . pantoprazole (PROTONIX) 40 MG tablet TAKE 1 TABLET BY MOUTH TWICE A DAY 60 tablet 3  . potassium chloride SA (K-DUR,KLOR-CON) 20 MEQ tablet Take 2 tablets (40 mEq total) by mouth daily. 60 tablet 0  . torsemide (DEMADEX) 100 MG tablet Take 1 tablet (100 mg total) by mouth daily. 60 tablet 5  . XIIDRA 5 % SOLN Place 1 drop into both eyes as needed (Dry eye).      No current facility-administered medications for this visit.     Allergies as of 07/27/2018 - Review Complete 07/27/2018  Allergen Reaction Noted  . Ambien [zolpidem tartrate] Other (See Comments) 01/31/2018  . Atorvastatin  07/31/2017    Family History  Problem Relation Age of Onset  . Diabetes Mother   . Cerebral aneurysm Mother   . Lung cancer Father   . Diabetes Sister   . Colon cancer Neg Hx   . Colon polyps Neg Hx   . Esophageal cancer Neg Hx   . Rectal cancer Neg Hx   . Stomach cancer Neg Hx     Social History   Socioeconomic History  . Marital status: Married    Spouse name: Not on file  . Number of children: 2  . Years of education: Not on file  . Highest education level: Not on file  Occupational History  . Occupation: retired  Scientific laboratory technician  . Financial resource strain: Not on file  . Food insecurity    Worry:  Not on file    Inability: Not on file  . Transportation needs    Medical: Not on file    Non-medical: Not on file  Tobacco Use  . Smoking status: Former Smoker    Packs/day: 1.00    Types: Cigarettes    Quit date: 01/19/2011    Years since quitting: 7.5  . Smokeless tobacco: Never Used  Substance and Sexual Activity  . Alcohol use: Yes    Alcohol/week: 1.0 standard drinks    Types: 1 Shots of liquor per week    Comment: rarely  . Drug use: No  .  Sexual activity: Not on file  Lifestyle  . Physical activity    Days per week: Not on file    Minutes per session: Not on file  . Stress: Not on file  Relationships  . Social Herbalist on phone: Not on file    Gets together: Not on file    Attends religious service: Not on file    Active member of club or organization: Not on file    Attends meetings of clubs or organizations: Not on file    Relationship status: Not on file  . Intimate partner violence    Fear of current or ex partner: Not on file    Emotionally abused: Not on file    Physically abused: Not on file    Forced sexual activity: Not on file  Other Topics Concern  . Not on file  Social History Narrative  . Not on file    Physical Exam: General: in no acute distress Neuro: Alert and appropriate Psych: Normal affect and normal insight Exam otherwise limited due to telehealth encounter.   Kalyn Dimattia L. Tarri Glenn, MD, MPH [facility] 07/27/2018, 5:28 PM

## 2018-07-27 ENCOUNTER — Encounter: Payer: Self-pay | Admitting: Gastroenterology

## 2018-07-27 ENCOUNTER — Ambulatory Visit (INDEPENDENT_AMBULATORY_CARE_PROVIDER_SITE_OTHER): Payer: Medicare Other | Admitting: Gastroenterology

## 2018-07-27 ENCOUNTER — Other Ambulatory Visit: Payer: Self-pay

## 2018-07-27 VITALS — Ht 70.0 in | Wt 200.0 lb

## 2018-07-27 DIAGNOSIS — K922 Gastrointestinal hemorrhage, unspecified: Secondary | ICD-10-CM

## 2018-07-27 DIAGNOSIS — K5520 Angiodysplasia of colon without hemorrhage: Secondary | ICD-10-CM

## 2018-07-27 NOTE — Patient Instructions (Addendum)
Follow a high fiber diet. I recommend that you eat at least 20 grams of fiber daily through fruits, vegetables, and bran fibers. If necessary, consider add a daily stool bulking agent such as Metamucil.  Continue to use Miralax 17g daily as needed to control your constipation.  Drink at least 1.5-2 liters of water.   Please call with any questions or concerns, particularly if you are worried about additional bleeding due to dark, black stools.   Toileting tips to help with your constipation  - Establish a time to try to move your bowels every day. For many people, this is after a cup of coffee.  - Sit all of the way back on the toilet keeping your back fairly straight and lean forward bending from your hips, resting your forearms on your thighs. Raising your feet with a step stool can be helpful.  - Relax the rectum feeling it bulge toward the toilet water. If you feel your rectum raising toward your body, you are contracting rather than relaxing.  - Breathe in and slowly exhale. "Belly breath" by expanding your belly towards your belly button. Keep belly expanded as you gently direct pressure down and back to the anus. A low pitched GRRR sound can assist with increasing intra-abdominal pressure.   - Repeat 3-4 times. If unsuccessful, contract the pelvic floor to restore normal tone and get off the toilet. Avoid excessive straining.  - To reduce excessive wiping by teaching your anus to normally contract, place hands on outer aspect of knees and resist knee movement outward. Hold 5-10 second then place hands just inside of knees and resist inward movement of knees. Hold 5 seconds. Repeat a few times each way.   High-Fiber Diet Fiber, also called dietary fiber, is a type of carbohydrate that is found in fruits, vegetables, whole grains, and beans. A high-fiber diet can have many health benefits. Your health care provider may recommend a high-fiber diet to help:  Prevent constipation. Fiber  can make your bowel movements more regular.  Lower your cholesterol.  Relieve the following conditions: ? Swelling of veins in the anus (hemorrhoids). ? Swelling and irritation (inflammation) of specific areas of the digestive tract (uncomplicated diverticulosis). ? A problem of the large intestine (colon) that sometimes causes pain and diarrhea (irritable bowel syndrome, IBS).  Prevent overeating as part of a weight-loss plan.  Prevent heart disease, type 2 diabetes, and certain cancers. What is my plan? The recommended daily fiber intake in grams (g) includes:  38 g for men age 48 or younger.  30 g for men over age 2.  36 g for women age 81 or younger.  21 g for women over age 61. You can get the recommended daily intake of dietary fiber by:  Eating a variety of fruits, vegetables, grains, and beans.  Taking a fiber supplement, if it is not possible to get enough fiber through your diet. What do I need to know about a high-fiber diet?  It is better to get fiber through food sources rather than from fiber supplements. There is not a lot of research about how effective supplements are.  Always check the fiber content on the nutrition facts label of any prepackaged food. Look for foods that contain 5 g of fiber or more per serving.  Talk with a diet and nutrition specialist (dietitian) if you have questions about specific foods that are recommended or not recommended for your medical condition, especially if those foods are not listed below.  Gradually increase how much fiber you consume. If you increase your intake of dietary fiber too quickly, you may have bloating, cramping, or gas.  Drink plenty of water. Water helps you to digest fiber. What are tips for following this plan?  Eat a wide variety of high-fiber foods.  Make sure that half of the grains that you eat each day are whole grains.  Eat breads and cereals that are made with whole-grain flour instead of refined  flour or white flour.  Eat brown rice, bulgur wheat, or millet instead of white rice.  Start the day with a breakfast that is high in fiber, such as a cereal that contains 5 g of fiber or more per serving.  Use beans in place of meat in soups, salads, and pasta dishes.  Eat high-fiber snacks, such as berries, raw vegetables, nuts, and popcorn.  Choose whole fruits and vegetables instead of processed forms like juice or sauce. What foods can I eat?  Fruits Berries. Pears. Apples. Oranges. Avocado. Prunes and raisins. Dried figs. Vegetables Sweet potatoes. Spinach. Kale. Artichokes. Cabbage. Broccoli. Cauliflower. Green peas. Carrots. Squash. Grains Whole-grain breads. Multigrain cereal. Oats and oatmeal. Brown rice. Barley. Bulgur wheat. [place]. Quinoa. Bran muffins. Popcorn. Rye wafer crackers. Meats and other proteins Navy, kidney, and pinto beans. Soybeans. Split peas. Lentils. Nuts and seeds. Dairy Fiber-fortified yogurt. Beverages Fiber-fortified soy milk. Fiber-fortified orange juice. Other foods Fiber bars. The items listed above may not be a complete list of recommended foods and beverages. Contact a dietitian for more options. What foods are not recommended? Fruits Fruit juice. Cooked, strained fruit. Vegetables Fried potatoes. Canned vegetables. Well-cooked vegetables. Grains White bread. Pasta made with refined flour. White rice. Meats and other proteins Fatty cuts of meat. Fried chicken or fried fish. Dairy Milk. Yogurt. Cream cheese. Sour cream. Fats and oils Butters. Beverages Soft drinks. Other foods Cakes and pastries. The items listed above may not be a complete list of foods and beverages to avoid. Contact a dietitian for more information. Summary  Fiber is a type of carbohydrate. It is found in fruits, vegetables, whole grains, and beans.  There are many health benefits of eating a high-fiber diet, such as preventing constipation, lowering blood  cholesterol, helping with weight loss, and reducing your risk of heart disease, diabetes, and certain cancers.  Gradually increase your intake of fiber. Increasing too fast can result in cramping, bloating, and gas. Drink plenty of water while you increase your fiber.  The best sources of fiber include whole fruits and vegetables, whole grains, nuts, seeds, and beans. This information is not intended to replace advice given to you by your health care provider. Make sure you discuss any questions you have with your health care provider. Document Released: 01/04/2005 Document Revised: 11/08/2016 Document Reviewed: 11/08/2016 Elsevier Patient Education  2020 [employer].

## 2018-07-31 ENCOUNTER — Encounter (HOSPITAL_COMMUNITY): Payer: Medicare Other

## 2018-08-02 ENCOUNTER — Other Ambulatory Visit: Payer: Self-pay | Admitting: Family Medicine

## 2018-08-04 ENCOUNTER — Encounter: Payer: Self-pay | Admitting: Family Medicine

## 2018-08-06 ENCOUNTER — Other Ambulatory Visit: Payer: Self-pay | Admitting: Family Medicine

## 2018-08-07 ENCOUNTER — Other Ambulatory Visit: Payer: Self-pay

## 2018-08-07 ENCOUNTER — Encounter (HOSPITAL_COMMUNITY)
Admission: RE | Admit: 2018-08-07 | Discharge: 2018-08-07 | Disposition: A | Discharge status: Home / Self Care | Payer: Medicare Other | Source: Ambulatory Visit | Attending: Nephrology | Admitting: Nephrology

## 2018-08-07 VITALS — BP 154/68 | HR 84 | Temp 97.2°F | Resp 20

## 2018-08-07 DIAGNOSIS — D508 Other iron deficiency anemias: Secondary | ICD-10-CM

## 2018-08-07 DIAGNOSIS — N184 Chronic kidney disease, stage 4 (severe): Secondary | ICD-10-CM | POA: Diagnosis not present

## 2018-08-07 LAB — POCT HEMOGLOBIN-HEMACUE: Hemoglobin: 11.7 g/dL — ABNORMAL LOW (ref 13.0–17.0)

## 2018-08-07 MED ORDER — EPOETIN ALFA-EPBX 10000 UNIT/ML IJ SOLN
40000.0000 [IU] | INTRAMUSCULAR | Status: DC
  Administered 2018-08-07: 40000 [IU] via SUBCUTANEOUS
  Filled 2018-08-07: qty 4

## 2018-08-10 NOTE — Telephone Encounter (Signed)
Refused please If patient needs this have patient notify us

## 2018-08-14 ENCOUNTER — Other Ambulatory Visit: Payer: Self-pay

## 2018-08-14 ENCOUNTER — Ambulatory Visit (HOSPITAL_COMMUNITY)
Admission: RE | Admit: 2018-08-14 | Discharge: 2018-08-14 | Disposition: A | Discharge status: Home / Self Care | Payer: Medicare Other | Source: Ambulatory Visit | Attending: Nephrology | Admitting: Nephrology

## 2018-08-14 VITALS — BP 168/82 | HR 86 | Temp 97.5°F | Resp 20

## 2018-08-14 DIAGNOSIS — D631 Anemia in chronic kidney disease: Secondary | ICD-10-CM | POA: Diagnosis not present

## 2018-08-14 DIAGNOSIS — D508 Other iron deficiency anemias: Secondary | ICD-10-CM

## 2018-08-14 DIAGNOSIS — N184 Chronic kidney disease, stage 4 (severe): Secondary | ICD-10-CM | POA: Diagnosis not present

## 2018-08-14 LAB — POCT HEMOGLOBIN-HEMACUE: Hemoglobin: 10.9 g/dL — ABNORMAL LOW (ref 13.0–17.0)

## 2018-08-14 MED ORDER — EPOETIN ALFA-EPBX 10000 UNIT/ML IJ SOLN
40000.0000 [IU] | INTRAMUSCULAR | Status: DC
  Administered 2018-08-14: 40000 [IU] via SUBCUTANEOUS
  Filled 2018-08-14: qty 4

## 2018-08-21 ENCOUNTER — Encounter (HOSPITAL_COMMUNITY)
Admission: RE | Admit: 2018-08-21 | Discharge: 2018-08-21 | Disposition: A | Discharge status: Home / Self Care | Payer: Medicare Other | Source: Ambulatory Visit | Attending: Nephrology | Admitting: Nephrology

## 2018-08-21 ENCOUNTER — Other Ambulatory Visit: Payer: Self-pay

## 2018-08-21 VITALS — BP 174/91 | HR 91 | Temp 97.7°F | Resp 20

## 2018-08-21 DIAGNOSIS — N189 Chronic kidney disease, unspecified: Secondary | ICD-10-CM | POA: Insufficient documentation

## 2018-08-21 DIAGNOSIS — D631 Anemia in chronic kidney disease: Secondary | ICD-10-CM | POA: Insufficient documentation

## 2018-08-21 DIAGNOSIS — D508 Other iron deficiency anemias: Secondary | ICD-10-CM

## 2018-08-21 LAB — RENAL FUNCTION PANEL
Albumin: 2.3 g/dL — ABNORMAL LOW (ref 3.5–5.0)
Anion gap: 10 (ref 5–15)
BUN: 71 mg/dL — ABNORMAL HIGH (ref 8–23)
CO2: 29 mmol/L (ref 22–32)
Calcium: 8.4 mg/dL — ABNORMAL LOW (ref 8.9–10.3)
Chloride: 102 mmol/L (ref 98–111)
Creatinine, Ser: 3.11 mg/dL — ABNORMAL HIGH (ref 0.61–1.24)
GFR calc Af Amer: 21 mL/min — ABNORMAL LOW (ref >60)
GFR calc non Af Amer: 18 mL/min — ABNORMAL LOW (ref >60)
Glucose, Bld: 87 mg/dL (ref 70–99)
Phosphorus: 4 mg/dL (ref 2.5–4.6)
Potassium: 3.4 mmol/L — ABNORMAL LOW (ref 3.5–5.1)
Sodium: 141 mmol/L (ref 135–145)

## 2018-08-21 LAB — CBC WITH DIFFERENTIAL/PLATELET
Abs Immature Granulocytes: 0 10*3/uL (ref 0.00–0.07)
Basophils Absolute: 0.2 10*3/uL — ABNORMAL HIGH (ref 0.0–0.1)
Basophils Relative: 2 %
Eosinophils Absolute: 0.4 10*3/uL (ref 0.0–0.5)
Eosinophils Relative: 5 %
HCT: 37.2 % — ABNORMAL LOW (ref 39.0–52.0)
Hemoglobin: 11.5 g/dL — ABNORMAL LOW (ref 13.0–17.0)
Lymphocytes Relative: 15 %
Lymphs Abs: 1.3 10*3/uL (ref 0.7–4.0)
MCH: 24 pg — ABNORMAL LOW (ref 26.0–34.0)
MCHC: 30.9 g/dL (ref 30.0–36.0)
MCV: 77.7 fL — ABNORMAL LOW (ref 80.0–100.0)
Monocytes Absolute: 0.5 10*3/uL (ref 0.1–1.0)
Monocytes Relative: 6 %
Neutro Abs: 6.2 10*3/uL (ref 1.7–7.7)
Neutrophils Relative %: 72 %
Platelets: 221 10*3/uL (ref 150–400)
RBC: 4.79 MIL/uL (ref 4.22–5.81)
RDW: 26 % — ABNORMAL HIGH (ref 11.5–15.5)
WBC: 8.6 10*3/uL (ref 4.0–10.5)
nRBC: 0 % (ref 0.0–0.2)
nRBC: 0 /100 WBC

## 2018-08-21 LAB — POCT HEMOGLOBIN-HEMACUE: Hemoglobin: 11.8 g/dL — ABNORMAL LOW (ref 13.0–17.0)

## 2018-08-21 LAB — IRON AND TIBC
Iron: 31 ug/dL — ABNORMAL LOW (ref 45–182)
Saturation Ratios: 11 % — ABNORMAL LOW (ref 17.9–39.5)
TIBC: 279 ug/dL (ref 250–450)
UIBC: 248 ug/dL

## 2018-08-21 LAB — FERRITIN: Ferritin: 157 ng/mL (ref 24–336)

## 2018-08-21 LAB — MAGNESIUM: Magnesium: 2.1 mg/dL (ref 1.7–2.4)

## 2018-08-21 MED ORDER — EPOETIN ALFA-EPBX 40000 UNIT/ML IJ SOLN
40000.0000 [IU] | INTRAMUSCULAR | Status: DC
  Administered 2018-08-21: 40000 [IU] via SUBCUTANEOUS
  Filled 2018-08-21: qty 1

## 2018-08-28 ENCOUNTER — Encounter (HOSPITAL_COMMUNITY)
Admission: RE | Admit: 2018-08-28 | Discharge: 2018-08-28 | Disposition: A | Discharge status: Home / Self Care | Payer: Medicare Other | Source: Ambulatory Visit | Attending: Nephrology | Admitting: Nephrology

## 2018-08-28 ENCOUNTER — Other Ambulatory Visit: Payer: Self-pay

## 2018-08-28 VITALS — BP 176/97 | HR 96 | Temp 97.9°F | Resp 20

## 2018-08-28 DIAGNOSIS — D508 Other iron deficiency anemias: Secondary | ICD-10-CM | POA: Insufficient documentation

## 2018-08-28 LAB — POCT HEMOGLOBIN-HEMACUE: Hemoglobin: 12.3 g/dL — ABNORMAL LOW (ref 13.0–17.0)

## 2018-08-28 MED ORDER — EPOETIN ALFA-EPBX 40000 UNIT/ML IJ SOLN
40000.0000 [IU] | INTRAMUSCULAR | Status: DC
  Filled 2018-08-28: qty 1

## 2018-08-31 ENCOUNTER — Other Ambulatory Visit: Payer: Self-pay | Admitting: Family Medicine

## 2018-09-04 ENCOUNTER — Encounter (HOSPITAL_COMMUNITY): Payer: Medicare Other

## 2018-09-11 ENCOUNTER — Encounter (HOSPITAL_COMMUNITY)
Admission: RE | Admit: 2018-09-11 | Discharge: 2018-09-11 | Disposition: A | Discharge status: Home / Self Care | Payer: Medicare Other | Source: Ambulatory Visit | Attending: Nephrology | Admitting: Nephrology

## 2018-09-11 ENCOUNTER — Other Ambulatory Visit: Payer: Self-pay

## 2018-09-11 VITALS — BP 170/92 | HR 94 | Temp 98.2°F | Resp 20

## 2018-09-11 DIAGNOSIS — N189 Chronic kidney disease, unspecified: Secondary | ICD-10-CM | POA: Diagnosis not present

## 2018-09-11 DIAGNOSIS — D508 Other iron deficiency anemias: Secondary | ICD-10-CM

## 2018-09-11 LAB — POCT HEMOGLOBIN-HEMACUE: Hemoglobin: 11.4 g/dL — ABNORMAL LOW (ref 13.0–17.0)

## 2018-09-11 MED ORDER — EPOETIN ALFA-EPBX 40000 UNIT/ML IJ SOLN
40000.0000 [IU] | INTRAMUSCULAR | Status: DC
  Administered 2018-09-11: 40000 [IU] via SUBCUTANEOUS
  Filled 2018-09-11: qty 1

## 2018-09-13 ENCOUNTER — Encounter: Payer: Self-pay | Admitting: Family Medicine

## 2018-09-14 ENCOUNTER — Encounter: Payer: Self-pay | Admitting: Family Medicine

## 2018-09-18 ENCOUNTER — Other Ambulatory Visit: Payer: Self-pay

## 2018-09-18 ENCOUNTER — Ambulatory Visit (HOSPITAL_COMMUNITY)
Admission: RE | Admit: 2018-09-18 | Discharge: 2018-09-18 | Disposition: A | Discharge status: Home / Self Care | Payer: Medicare Other | Source: Ambulatory Visit | Attending: Nephrology | Admitting: Nephrology

## 2018-09-18 VITALS — BP 179/94 | HR 101 | Temp 98.0°F | Resp 20

## 2018-09-18 DIAGNOSIS — D508 Other iron deficiency anemias: Secondary | ICD-10-CM | POA: Insufficient documentation

## 2018-09-18 LAB — POCT HEMOGLOBIN-HEMACUE: Hemoglobin: 12.1 g/dL — ABNORMAL LOW (ref 13.0–17.0)

## 2018-09-18 MED ORDER — EPOETIN ALFA-EPBX 10000 UNIT/ML IJ SOLN
40000.0000 [IU] | INTRAMUSCULAR | Status: DC
  Filled 2018-09-18: qty 4

## 2018-09-24 ENCOUNTER — Other Ambulatory Visit: Payer: Self-pay | Admitting: Gastroenterology

## 2018-09-26 ENCOUNTER — Other Ambulatory Visit: Payer: Self-pay | Admitting: Family Medicine

## 2018-09-26 ENCOUNTER — Encounter (HOSPITAL_COMMUNITY): Payer: Medicare Other

## 2018-09-27 ENCOUNTER — Encounter: Payer: Self-pay | Admitting: Family Medicine

## 2018-10-02 ENCOUNTER — Encounter (HOSPITAL_COMMUNITY)
Admission: RE | Admit: 2018-10-02 | Discharge: 2018-10-02 | Disposition: A | Discharge status: Home / Self Care | Payer: Medicare Other | Source: Ambulatory Visit | Attending: Nephrology | Admitting: Nephrology

## 2018-10-02 ENCOUNTER — Other Ambulatory Visit: Payer: Self-pay

## 2018-10-02 VITALS — BP 174/98 | HR 101 | Temp 97.3°F | Resp 20

## 2018-10-02 DIAGNOSIS — D631 Anemia in chronic kidney disease: Secondary | ICD-10-CM | POA: Insufficient documentation

## 2018-10-02 DIAGNOSIS — N184 Chronic kidney disease, stage 4 (severe): Secondary | ICD-10-CM | POA: Diagnosis present

## 2018-10-02 DIAGNOSIS — D508 Other iron deficiency anemias: Secondary | ICD-10-CM

## 2018-10-02 LAB — CBC WITH DIFFERENTIAL/PLATELET
Abs Immature Granulocytes: 0.03 10*3/uL (ref 0.00–0.07)
Basophils Absolute: 0 10*3/uL (ref 0.0–0.1)
Basophils Relative: 0 %
Eosinophils Absolute: 0.1 10*3/uL (ref 0.0–0.5)
Eosinophils Relative: 2 %
HCT: 36.7 % — ABNORMAL LOW (ref 39.0–52.0)
Hemoglobin: 11.5 g/dL — ABNORMAL LOW (ref 13.0–17.0)
Immature Granulocytes: 0 %
Lymphocytes Relative: 14 %
Lymphs Abs: 1 10*3/uL (ref 0.7–4.0)
MCH: 24.2 pg — ABNORMAL LOW (ref 26.0–34.0)
MCHC: 31.3 g/dL (ref 30.0–36.0)
MCV: 77.3 fL — ABNORMAL LOW (ref 80.0–100.0)
Monocytes Absolute: 0.4 10*3/uL (ref 0.1–1.0)
Monocytes Relative: 6 %
Neutro Abs: 5.6 10*3/uL (ref 1.7–7.7)
Neutrophils Relative %: 78 %
Platelets: 191 10*3/uL (ref 150–400)
RBC: 4.75 MIL/uL (ref 4.22–5.81)
RDW: 21.9 % — ABNORMAL HIGH (ref 11.5–15.5)
WBC: 7.2 10*3/uL (ref 4.0–10.5)
nRBC: 0 % (ref 0.0–0.2)

## 2018-10-02 LAB — RENAL FUNCTION PANEL
Albumin: 2.2 g/dL — ABNORMAL LOW (ref 3.5–5.0)
Anion gap: 14 (ref 5–15)
BUN: 92 mg/dL — ABNORMAL HIGH (ref 8–23)
CO2: 26 mmol/L (ref 22–32)
Calcium: 8.3 mg/dL — ABNORMAL LOW (ref 8.9–10.3)
Chloride: 102 mmol/L (ref 98–111)
Creatinine, Ser: 4.94 mg/dL — ABNORMAL HIGH (ref 0.61–1.24)
GFR calc Af Amer: 12 mL/min — ABNORMAL LOW (ref >60)
GFR calc non Af Amer: 10 mL/min — ABNORMAL LOW (ref >60)
Glucose, Bld: 121 mg/dL — ABNORMAL HIGH (ref 70–99)
Phosphorus: 4.7 mg/dL — ABNORMAL HIGH (ref 2.5–4.6)
Potassium: 3.2 mmol/L — ABNORMAL LOW (ref 3.5–5.1)
Sodium: 142 mmol/L (ref 135–145)

## 2018-10-02 LAB — IRON AND TIBC
Iron: 23 ug/dL — ABNORMAL LOW (ref 45–182)
Saturation Ratios: 8 % — ABNORMAL LOW (ref 17.9–39.5)
TIBC: 305 ug/dL (ref 250–450)
UIBC: 282 ug/dL

## 2018-10-02 LAB — POCT HEMOGLOBIN-HEMACUE: Hemoglobin: 11.7 g/dL — ABNORMAL LOW (ref 13.0–17.0)

## 2018-10-02 LAB — FERRITIN: Ferritin: 160 ng/mL (ref 24–336)

## 2018-10-02 LAB — MAGNESIUM: Magnesium: 2.2 mg/dL (ref 1.7–2.4)

## 2018-10-02 MED ORDER — EPOETIN ALFA-EPBX 10000 UNIT/ML IJ SOLN
40000.0000 [IU] | INTRAMUSCULAR | Status: DC
  Administered 2018-10-02: 40000 [IU] via SUBCUTANEOUS
  Filled 2018-10-02: qty 4

## 2018-10-04 ENCOUNTER — Emergency Department (HOSPITAL_COMMUNITY): Payer: Medicare Other

## 2018-10-04 ENCOUNTER — Other Ambulatory Visit: Payer: Self-pay

## 2018-10-04 ENCOUNTER — Inpatient Hospital Stay (HOSPITAL_COMMUNITY)
Admission: EM | Admit: 2018-10-04 | Discharge: 2018-10-13 | DRG: 291 | Disposition: A | Discharge status: Home Health | Payer: Medicare Other | Attending: Internal Medicine | Admitting: Internal Medicine

## 2018-10-04 DIAGNOSIS — Z87891 Personal history of nicotine dependence: Secondary | ICD-10-CM

## 2018-10-04 DIAGNOSIS — J9621 Acute and chronic respiratory failure with hypoxia: Secondary | ICD-10-CM | POA: Diagnosis present

## 2018-10-04 DIAGNOSIS — D509 Iron deficiency anemia, unspecified: Secondary | ICD-10-CM | POA: Diagnosis present

## 2018-10-04 DIAGNOSIS — K219 Gastro-esophageal reflux disease without esophagitis: Secondary | ICD-10-CM | POA: Diagnosis present

## 2018-10-04 DIAGNOSIS — Z801 Family history of malignant neoplasm of trachea, bronchus and lung: Secondary | ICD-10-CM

## 2018-10-04 DIAGNOSIS — Z79899 Other long term (current) drug therapy: Secondary | ICD-10-CM

## 2018-10-04 DIAGNOSIS — Z992 Dependence on renal dialysis: Secondary | ICD-10-CM

## 2018-10-04 DIAGNOSIS — Z902 Acquired absence of lung [part of]: Secondary | ICD-10-CM

## 2018-10-04 DIAGNOSIS — Z7951 Long term (current) use of inhaled steroids: Secondary | ICD-10-CM

## 2018-10-04 DIAGNOSIS — N186 End stage renal disease: Secondary | ICD-10-CM | POA: Diagnosis present

## 2018-10-04 DIAGNOSIS — E7849 Other hyperlipidemia: Secondary | ICD-10-CM | POA: Diagnosis not present

## 2018-10-04 DIAGNOSIS — I272 Pulmonary hypertension, unspecified: Secondary | ICD-10-CM | POA: Diagnosis present

## 2018-10-04 DIAGNOSIS — I480 Paroxysmal atrial fibrillation: Secondary | ICD-10-CM | POA: Diagnosis present

## 2018-10-04 DIAGNOSIS — Z20828 Contact with and (suspected) exposure to other viral communicable diseases: Secondary | ICD-10-CM | POA: Diagnosis present

## 2018-10-04 DIAGNOSIS — N184 Chronic kidney disease, stage 4 (severe): Secondary | ICD-10-CM

## 2018-10-04 DIAGNOSIS — I5033 Acute on chronic diastolic (congestive) heart failure: Secondary | ICD-10-CM | POA: Diagnosis present

## 2018-10-04 DIAGNOSIS — I132 Hypertensive heart and chronic kidney disease with heart failure and with stage 5 chronic kidney disease, or end stage renal disease: Secondary | ICD-10-CM | POA: Diagnosis not present

## 2018-10-04 DIAGNOSIS — E785 Hyperlipidemia, unspecified: Secondary | ICD-10-CM | POA: Diagnosis present

## 2018-10-04 DIAGNOSIS — C3412 Malignant neoplasm of upper lobe, left bronchus or lung: Secondary | ICD-10-CM

## 2018-10-04 DIAGNOSIS — I10 Essential (primary) hypertension: Secondary | ICD-10-CM | POA: Diagnosis present

## 2018-10-04 DIAGNOSIS — Z833 Family history of diabetes mellitus: Secondary | ICD-10-CM

## 2018-10-04 DIAGNOSIS — N042 Nephrotic syndrome with diffuse membranous glomerulonephritis: Secondary | ICD-10-CM | POA: Diagnosis present

## 2018-10-04 DIAGNOSIS — N179 Acute kidney failure, unspecified: Secondary | ICD-10-CM | POA: Diagnosis present

## 2018-10-04 DIAGNOSIS — D573 Sickle-cell trait: Secondary | ICD-10-CM | POA: Diagnosis present

## 2018-10-04 DIAGNOSIS — I350 Nonrheumatic aortic (valve) stenosis: Secondary | ICD-10-CM | POA: Diagnosis present

## 2018-10-04 DIAGNOSIS — I509 Heart failure, unspecified: Secondary | ICD-10-CM | POA: Diagnosis not present

## 2018-10-04 DIAGNOSIS — C3492 Malignant neoplasm of unspecified part of left bronchus or lung: Secondary | ICD-10-CM | POA: Diagnosis present

## 2018-10-04 DIAGNOSIS — N189 Chronic kidney disease, unspecified: Secondary | ICD-10-CM | POA: Diagnosis present

## 2018-10-04 DIAGNOSIS — E876 Hypokalemia: Secondary | ICD-10-CM | POA: Diagnosis present

## 2018-10-04 DIAGNOSIS — D631 Anemia in chronic kidney disease: Secondary | ICD-10-CM | POA: Diagnosis present

## 2018-10-04 DIAGNOSIS — J9620 Acute and chronic respiratory failure, unspecified whether with hypoxia or hypercapnia: Secondary | ICD-10-CM | POA: Diagnosis present

## 2018-10-04 DIAGNOSIS — Z888 Allergy status to other drugs, medicaments and biological substances status: Secondary | ICD-10-CM

## 2018-10-04 LAB — CBC WITH DIFFERENTIAL/PLATELET
Abs Immature Granulocytes: 0.05 10*3/uL (ref 0.00–0.07)
Basophils Absolute: 0 10*3/uL (ref 0.0–0.1)
Basophils Relative: 0 %
Eosinophils Absolute: 0.1 10*3/uL (ref 0.0–0.5)
Eosinophils Relative: 1 %
HCT: 36.9 % — ABNORMAL LOW (ref 39.0–52.0)
Hemoglobin: 11.4 g/dL — ABNORMAL LOW (ref 13.0–17.0)
Immature Granulocytes: 1 %
Lymphocytes Relative: 15 %
Lymphs Abs: 1.2 10*3/uL (ref 0.7–4.0)
MCH: 24.1 pg — ABNORMAL LOW (ref 26.0–34.0)
MCHC: 30.9 g/dL (ref 30.0–36.0)
MCV: 77.8 fL — ABNORMAL LOW (ref 80.0–100.0)
Monocytes Absolute: 0.6 10*3/uL (ref 0.1–1.0)
Monocytes Relative: 8 %
Neutro Abs: 6.3 10*3/uL (ref 1.7–7.7)
Neutrophils Relative %: 75 %
Platelets: 188 10*3/uL (ref 150–400)
RBC: 4.74 MIL/uL (ref 4.22–5.81)
RDW: 22.3 % — ABNORMAL HIGH (ref 11.5–15.5)
WBC: 8.4 10*3/uL (ref 4.0–10.5)
nRBC: 0 % (ref 0.0–0.2)

## 2018-10-04 LAB — LIPID PANEL
Cholesterol: 195 mg/dL (ref 0–200)
HDL: 63 mg/dL (ref >40)
LDL Cholesterol: 115 mg/dL — ABNORMAL HIGH (ref 0–99)
Total CHOL/HDL Ratio: 3.1 RATIO
Triglycerides: 84 mg/dL (ref <150)
VLDL: 17 mg/dL (ref 0–40)

## 2018-10-04 LAB — URINALYSIS, ROUTINE W REFLEX MICROSCOPIC
Bilirubin Urine: NEGATIVE
Glucose, UA: 50 mg/dL — AB
Ketones, ur: NEGATIVE mg/dL
Nitrite: NEGATIVE
Protein, ur: >=300 mg/dL — AB
Specific Gravity, Urine: 1.015 (ref 1.005–1.030)
WBC, UA: >50 WBC/hpf — ABNORMAL HIGH (ref 0–5)
pH: 6 (ref 5.0–8.0)

## 2018-10-04 LAB — PROTIME-INR
INR: 1.1 (ref 0.8–1.2)
Prothrombin Time: 14.1 seconds (ref 11.4–15.2)

## 2018-10-04 LAB — BASIC METABOLIC PANEL
Anion gap: 15 (ref 5–15)
BUN: 90 mg/dL — ABNORMAL HIGH (ref 8–23)
CO2: 22 mmol/L (ref 22–32)
Calcium: 8.5 mg/dL — ABNORMAL LOW (ref 8.9–10.3)
Chloride: 102 mmol/L (ref 98–111)
Creatinine, Ser: 4.71 mg/dL — ABNORMAL HIGH (ref 0.61–1.24)
GFR calc Af Amer: 13 mL/min — ABNORMAL LOW (ref >60)
GFR calc non Af Amer: 11 mL/min — ABNORMAL LOW (ref >60)
Glucose, Bld: 119 mg/dL — ABNORMAL HIGH (ref 70–99)
Potassium: 3.3 mmol/L — ABNORMAL LOW (ref 3.5–5.1)
Sodium: 139 mmol/L (ref 135–145)

## 2018-10-04 LAB — TSH: TSH: 4.227 u[IU]/mL (ref 0.350–4.500)

## 2018-10-04 LAB — TROPONIN I (HIGH SENSITIVITY)
Troponin I (High Sensitivity): 103 ng/L (ref <18)
Troponin I (High Sensitivity): 108 ng/L (ref <18)
Troponin I (High Sensitivity): 93 ng/L — ABNORMAL HIGH (ref <18)

## 2018-10-04 LAB — MAGNESIUM: Magnesium: 2 mg/dL (ref 1.7–2.4)

## 2018-10-04 LAB — BRAIN NATRIURETIC PEPTIDE: B Natriuretic Peptide: 3681.8 pg/mL — ABNORMAL HIGH (ref 0.0–100.0)

## 2018-10-04 LAB — PHOSPHORUS: Phosphorus: 4.5 mg/dL (ref 2.5–4.6)

## 2018-10-04 MED ORDER — VITAMIN D 25 MCG (1000 UNIT) PO TABS
1000.0000 [IU] | ORAL_TABLET | Freq: Every day | ORAL | Status: DC
  Administered 2018-10-05 – 2018-10-13 (×9): 1000 [IU] via ORAL
  Filled 2018-10-04 (×9): qty 1

## 2018-10-04 MED ORDER — ACETAMINOPHEN 650 MG RE SUPP: 650.0000 mg | Freq: Four times a day (QID) | RECTAL | Status: DC | PRN

## 2018-10-04 MED ORDER — ENOXAPARIN SODIUM 30 MG/0.3ML ~~LOC~~ SOLN
30.0000 mg | SUBCUTANEOUS | Status: DC
  Administered 2018-10-04: 30 mg via SUBCUTANEOUS
  Filled 2018-10-04: qty 0.3

## 2018-10-04 MED ORDER — ASPIRIN EC 81 MG PO TBEC
81.0000 mg | DELAYED_RELEASE_TABLET | Freq: Every day | ORAL | Status: DC
  Administered 2018-10-04 – 2018-10-13 (×10): 81 mg via ORAL
  Filled 2018-10-04 (×10): qty 1

## 2018-10-04 MED ORDER — ONDANSETRON HCL 4 MG/2ML IJ SOLN: 4.0000 mg | Freq: Four times a day (QID) | INTRAMUSCULAR | Status: DC | PRN

## 2018-10-04 MED ORDER — ACETAMINOPHEN 325 MG PO TABS: 650.0000 mg | ORAL_TABLET | Freq: Four times a day (QID) | ORAL | Status: DC | PRN

## 2018-10-04 MED ORDER — POTASSIUM CHLORIDE CRYS ER 20 MEQ PO TBCR
40.0000 meq | EXTENDED_RELEASE_TABLET | Freq: Once | ORAL | Status: AC
  Administered 2018-10-04: 18:00:00 40 meq via ORAL
  Filled 2018-10-04: qty 2

## 2018-10-04 MED ORDER — PANTOPRAZOLE SODIUM 40 MG PO TBEC
40.0000 mg | DELAYED_RELEASE_TABLET | Freq: Two times a day (BID) | ORAL | Status: DC
  Administered 2018-10-04 – 2018-10-13 (×18): 40 mg via ORAL
  Filled 2018-10-04 (×18): qty 1

## 2018-10-04 MED ORDER — HYDRALAZINE HCL 20 MG/ML IJ SOLN
10.0000 mg | Freq: Four times a day (QID) | INTRAMUSCULAR | Status: DC | PRN
  Administered 2018-10-04 – 2018-10-06 (×2): 10 mg via INTRAVENOUS
  Filled 2018-10-04: qty 0.5
  Filled 2018-10-04 (×2): qty 1

## 2018-10-04 MED ORDER — AMLODIPINE BESYLATE 2.5 MG PO TABS
2.5000 mg | ORAL_TABLET | Freq: Every day | ORAL | Status: DC
  Filled 2018-10-04: qty 1

## 2018-10-04 MED ORDER — FUROSEMIDE 10 MG/ML IJ SOLN
40.0000 mg | Freq: Two times a day (BID) | INTRAMUSCULAR | Status: DC
  Administered 2018-10-04 – 2018-10-05 (×2): 40 mg via INTRAVENOUS
  Filled 2018-10-04 (×2): qty 4

## 2018-10-04 MED ORDER — ONDANSETRON HCL 4 MG PO TABS: 4.0000 mg | ORAL_TABLET | Freq: Four times a day (QID) | ORAL | Status: DC | PRN

## 2018-10-04 NOTE — ED Triage Notes (Signed)
Pt presents with SOB "off and on" worsens with exertion x6 days. Dr. Posey Pronto called him 2 days ago and instructed him to come to ED for some abnormal blood work. Pt speaking in broken sentences 92% on 2L [place]

## 2018-10-04 NOTE — ED Provider Notes (Signed)
[facility] EMERGENCY DEPARTMENT Provider Note   CSN: [id number] Arrival date & time: 10/04/18  1232     History   Chief Complaint Chief Complaint  Patient presents with  . Shortness of Breath    HPI Andrew Guerra is a 78 y.o. male.  He was sent from Dr. Serita Grit office to come to the ER for evaluation of abnormal blood work.  He has pulmonary hypertension and CHF and uses 2 L nasal cannula.  He says he gets out of breath if he has to do anything fast.  He has had peripheral edema in his lower extremities for over a year and is on diuretics with worsening renal function but is so far tried to not go onto dialysis.  He says his leg swelling is about the same and has been continue with his regular medications.  He is not sure what blood work was abnormal but he thinks it was something to do with his kidney function.  Denies any chest pain no fevers chills or cough.  Short of breath is at baseline and sometimes worsens with exertion and improves with rest.     The history is provided by the patient.  Shortness of Breath Severity:  Moderate Onset quality:  Gradual Timing:  Intermittent Progression:  Unchanged Chronicity:  Chronic Context: activity   Relieved by:  Rest and sitting up Worsened by:  Activity Ineffective treatments:  Diuretics Associated symptoms: no abdominal pain, no chest pain, no cough, no diaphoresis, no fever, no headaches, no hemoptysis, no neck pain, no rash, no sore throat, no syncope and no vomiting     Past Medical History:  Diagnosis Date  . 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  [name] Cataract    right eye - surgery to remove  . Elevated PSA 11/29/2016   Patient is followed by [facility].  Most recent PSA near 11.  He has had atypia on biopsy.  November 2008  . Essential hypertension, benign 06/08/2012  . Full  dentures   . Glaucoma   . Heart murmur    since childhood, never has caused any problems  . History of rheumatic fever 11/04/2015  . Hyperlipidemia 04/25/2013  . Hypertension   . Malignant neoplasm of left lung ([place]) 02/08/2017  . RBBB 07/12/2017  . Sickle cell trait ([place])    no problems per patient    Patient Active Problem List   Diagnosis Date Noted  . Heme positive stool   . Benign neoplasm of colon   . Pulmonary hypertension, unspecified ([place]) 05/30/2018  . Chronic diastolic heart failure ([place]) 05/30/2018  . Acute renal failure ([place])   . PAF (paroxysmal atrial fibrillation) ([place])   . Pressure injury of skin 01/17/2018  . Intestinal metaplasia of gastric mucosa   . Iron deficiency anemia   . Anasarca   . Malnutrition of moderate degree 01/10/2018  . GI bleed 01/09/2018  . Acute respiratory failure with hypoxia ([place]) 11/12/2017  . Gastritis and gastroduodenitis   . Acute gastric ulcer without hemorrhage or perforation   . Duodenal ulcer   . Paraesophageal hernia   . Upper GI bleeding 11/09/2017  . CKD (chronic kidney disease), stage III ([place]) 11/09/2017  . Anemia 11/09/2017  . Melena   . RBBB 07/12/2017  . Pulmonary nodule 02/17/2017  . 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 Malignant  neoplasm of left lung ([place]) 02/08/2017  . Elevated PSA 11/29/2016  . Aortic valve stenosis, moderate 12/26/2015  . BPH (benign prostatic hyperplasia) 12/18/2015  . Hypertension 12/18/2015  . 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 Heart murmur 11/04/2015  . History of active rheumatic fever 11/04/2015  . Solitary pulmonary nodule 08/31/2013  . 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 Thyroid nodule 08/31/2013  . Hyperlipidemia 04/25/2013  . Essential hypertension, benign 06/08/2012    Past Surgical History:  Procedure Laterality Date  . BIOPSY  11/11/2017   Procedure: BIOPSY;  Surgeon: Lavena Bullion, DO;  Location: [facility]  ENDOSCOPY;  Service: Gastroenterology;;  . BIOPSY  01/10/2018   Procedure: BIOPSY;  Surgeon: [name], MD;  Location: [facility];  Service: Gastroenterology;;  . BIOPSY  06/22/2018   Procedure: BIOPSY;  Surgeon: Yetta Flock, MD;  Location[facility] ENDOSCOPY;  Service: Gastroenterology;;  . cataract eye surgery Right   . COLONOSCOPY  11/2009   hx polyps/[place]  . COLONOSCOPY N/A 06/22/2018   Procedure: COLONOSCOPY;  Surgeon: Yetta Flock, MD;  Location: [facility];  Service: Gastroenterology;  Laterality: N/A;  . ENTEROSCOPY N/A 06/22/2018   Procedure: PUSH ENTEROSCOPY;  Surgeon: Yetta Flock, MD;  Location: [facility] ENDOSCOPY;  Service: Gastroenterology;  Laterality: N/A;  . ESOPHAGOGASTRODUODENOSCOPY N/A 11/11/2017   Procedure: ESOPHAGOGASTRODUODENOSCOPY (EGD);  Surgeon: Lavena Bullion, DO;  Location[facility] ENDOSCOPY;  Service: Gastroenterology;  Laterality: N/A;  . ESOPHAGOGASTRODUODENOSCOPY (EGD) WITH PROPOFOL N/A 01/10/2018   Procedure: ESOPHAGOGASTRODUODENOSCOPY (EGD) WITH PROPOFOL;  Surgeon: [name], MD;  Location: [facility];  Service: Gastroenterology;  Laterality: N/A;  . HOT HEMOSTASIS N/A 06/22/2018   Procedure: HOT HEMOSTASIS (ARGON PLASMA COAGULATION/BICAP);  Surgeon: Yetta Flock, MD;  Location: [facility] ENDOSCOPY;  Service: Gastroenterology;  Laterality: N/A;  . IR FLUORO GUIDE CV LINE RIGHT  01/16/2018  . IR FLUORO GUIDE CV LINE RIGHT  01/31/2018  . IR US GUIDE VASC ACCESS RIGHT  01/16/2018  . IR US GUIDE VASC ACCESS RIGHT  01/31/2018  . POLYPECTOMY  06/22/2018   Procedure: POLYPECTOMY;  Surgeon: Yetta Flock, MD;  Location: [facility];  Service: Gastroenterology;;  . PROSTATE BIOPSY    . right eye surgery     to lower eye pressure  . RIGHT HEART CATH N/A 01/19/2018   Procedure: RIGHT HEART CATH;  Surgeon: Nelva Bush, MD;  Location: [facility] CV LAB;  Service: Cardiovascular;  Laterality: N/A;  . TONSILLECTOMY    . VIDEO  ASSISTED THORACOSCOPY (VATS)/ LOBECTOMY Right 12/26/2015  . wisdom tteeth ext          Home Medications    Prior to Admission medications   Medication Sig Start Date End Date Taking? Authorizing Provider  amLODipine (NORVASC) 2.5 MG tablet TAKE 1 TABLET BY MOUTH EVERY DAY 09/26/18   Luking, Elayne Snare, MD  Brinzolamide-Brimonidine [facility]) 1-0.2 % SUSP Place 2-3 drops into both eyes 3 (three) times daily.     [provider]  budesonide-formoterol (SYMBICORT) 80-4.5 MCG/ACT inhaler Inhale 2 puffs into the lungs as needed.    [provider]  cholecalciferol (VITAMIN D3) 25 MCG (1000 UT) tablet Take 1,000 Units by mouth daily.    [provider]  metolazone (ZAROXOLYN) 2.5 MG tablet 1 on Monday 1 on Friday 07/12/18   [name], MD  Multiple Vitamin (MULTIVITAMIN) tablet Take 1 tablet by mouth daily. With vitamin D    [provider]  Netarsudil-Latanoprost (ROCKLATAN) 0.02-0.005 % SOLN  Place 1 drop into both eyes at bedtime.     Marylynn Pearson, MD  Nutritional Supplements (FEEDING SUPPLEMENT, NEPRO CARB STEADY,) LIQD Take 237 mLs by mouth 2 (two) times daily.    [provider]  pantoprazole (PROTONIX) 40 MG tablet TAKE 1 TABLET BY MOUTH TWICE A DAY 09/24/18   [name], MD  potassium chloride SA (K-DUR,KLOR-CON) 20 MEQ tablet Take 2 tablets (40 mEq total) by mouth daily. 02/09/18   Ghimire, Henreitta Leber, MD  torsemide (DEMADEX) 100 MG tablet Take 1 tablet (100 mg total) by mouth daily. 07/12/18   [name], MD  XIIDRA 5 % SOLN Place 1 drop into both eyes as needed (Dry eye).  03/27/18   [provider]    Family History Family History  Problem Relation Age of Onset  . Diabetes Mother   . Cerebral aneurysm Mother   . Lung cancer Father   . Diabetes Sister   . Colon cancer Neg Hx   . Colon polyps Neg Hx   . Esophageal cancer Neg Hx   . Rectal cancer Neg Hx   . Stomach cancer Neg Hx     Social History Social History    Tobacco Use  . Smoking status: Former Smoker    Packs/day: 1.00    Types: Cigarettes    Quit date: 01/19/2011    Years since quitting: 7.7  . Smokeless tobacco: Never Used  Substance Use Topics  . Alcohol use: Yes    Alcohol/week: 1.0 standard drinks    Types: 1 Shots of liquor per week    Comment: rarely  . Drug use: No     Allergies   Ambien [zolpidem tartrate] and Atorvastatin   Review of Systems Review of Systems  Constitutional: Negative for diaphoresis and fever.  HENT: Negative for sore throat.   Eyes: Negative for visual disturbance.  Respiratory: Positive for shortness of breath. Negative for cough and hemoptysis.   Cardiovascular: Positive for leg swelling. Negative for chest pain and syncope.  Gastrointestinal: Negative for abdominal pain and vomiting.  Genitourinary: Negative for dysuria.  Musculoskeletal: Negative for neck pain.  Skin: Negative for rash.  Neurological: Negative for headaches.     Physical Exam Updated Vital Signs BP (!) 188/109   Pulse 87   Resp 19   SpO2 97%   Physical Exam Vitals signs and nursing note reviewed.  Constitutional:      Appearance: He is well-developed.  HENT:     Head: Normocephalic and atraumatic.  Eyes:     Conjunctiva/sclera: Conjunctivae normal.  Neck:     Musculoskeletal: Neck supple.  Cardiovascular:     Rate and Rhythm: Normal rate and regular rhythm.     Heart sounds: No murmur.  Pulmonary:     Effort: Pulmonary effort is normal. Tachypnea present. No respiratory distress.     Breath sounds: Normal breath sounds.  Abdominal:     Palpations: Abdomen is soft.     Tenderness: There is no abdominal tenderness.  Musculoskeletal:     Right lower leg: He exhibits no tenderness. Edema present.     Left lower leg: He exhibits no tenderness. Edema present.  Skin:    General: Skin is warm and dry.     Capillary Refill: Capillary refill takes less than 2 seconds.  Neurological:     General: No focal deficit  present.     Mental Status: He is alert.      ED Treatments / Results  Labs (all labs  ordered are listed, but only abnormal results are displayed) Labs Reviewed  BASIC METABOLIC PANEL - Abnormal; Notable for the following components:      Result Value   Potassium 3.3 (*)    Glucose, Bld 119 (*)    BUN 90 (*)    Creatinine, Ser 4.71 (*)    Calcium 8.5 (*)    GFR calc non Af Amer 11 (*)    GFR calc Af Amer 13 (*)    All other components within normal limits  CBC WITH DIFFERENTIAL/PLATELET - Abnormal; Notable for the following components:   Hemoglobin 11.4 (*)    HCT 36.9 (*)    MCV 77.8 (*)    MCH 24.1 (*)    RDW 22.3 (*)    All other components within normal limits  URINALYSIS, ROUTINE W REFLEX MICROSCOPIC - Abnormal; Notable for the following components:   APPearance HAZY (*)    Glucose, UA 50 (*)    Hgb urine dipstick SMALL (*)    Protein, ur >=300 (*)    Leukocytes,Ua MODERATE (*)    WBC, UA >50 (*)    Bacteria, UA RARE (*)    All other components within normal limits  Andrew NATRIURETIC PEPTIDE - Abnormal; Notable for the following components:   B Natriuretic Peptide 3,681.8 (*)    All other components within normal limits  TROPONIN I (HIGH SENSITIVITY) - Abnormal; Notable for the following components:   Troponin I (High Sensitivity) 103 (*)    All other components within normal limits  TROPONIN I (HIGH SENSITIVITY) - Abnormal; Notable for the following components:   Troponin I (High Sensitivity) 108 (*)    All other components within normal limits  SARS CORONAVIRUS 2 (TAT 6-24 HRS)  PROTIME-INR  CBC  CREATININE, SERUM  MAGNESIUM  PHOSPHORUS  TSH  LIPID PANEL  COMPREHENSIVE METABOLIC PANEL  CBC  TROPONIN I (HIGH SENSITIVITY)    EKG EKG Interpretation  Date/Time:  Wednesday October 04 2018 12:39:10 EDT Ventricular Rate:  97 PR Interval:  164 QRS Duration: 168 QT Interval:  408 QTC Calculation: 518 R Axis:   -52 Text Interpretation:  Normal sinus  rhythm Possible Left atrial enlargement Right bundle branch block Left anterior fascicular block Inferior infarct , age undetermined Abnormal ECG similar to prior 4/20 Confirmed by Aletta Edouard [phone]) on 10/04/2018 1:00:22 PM   Radiology Dg Chest 2 View  Result Date: 10/04/2018 CLINICAL DATA:  [age]-year-old male with shortness of breath. EXAM: CHEST - 2 VIEW COMPARISON:  Chest radiograph dated 02/03/2018 FINDINGS: Interval removal of the right-sided dialysis catheter. Small bilateral pleural effusions with bibasilar atelectasis or infiltrate. No pneumothorax. Stable cardiac silhouette. No acute osseous pathology. IMPRESSION: Small bilateral pleural effusions with bibasilar atelectasis or infiltrate. Electronically Signed   By: Anner Crete M.D.   On: 10/04/2018 13:15    Procedures Procedures (including critical care time)  Medications Ordered in ED Medications - No data to display   Initial Impression / Assessment and Plan / ED Course  I have reviewed the triage vital signs and the nursing notes.  Pertinent labs & imaging results that were available during my care of the patient were reviewed by me and considered in my medical decision making (see chart for details).  Clinical Course as of Oct 03 1721  Wed Oct 03, 6660  8869 78 year old male with history of chronic shortness of breath CHF here for evaluation of abnormal lab values from his nephrologist.  Differential includes AKI, dehydration, ACS, pneumothorax, pneumonia,  anemia.   [MB]  8850 Discussed with Dr. Hollie Salk from [facility].  Was unable to reach Dr. Posey Pronto.  She did recommend that if he needs diuresis for his breathing standpoint he should probably be in the hospital without increasing creatinine.  Will review with the patient and see what he wants to do.   [MB]  1507 Reviewed with the patient plan for admission for diuresis.  He said he needs to think about it before he is getting him an answer whether he is  willing to be admitted.   [MB]  2774 Patient states he will agree to admission.  Have added on Covid testing.  Placed a call into medicine unassigned.   [MB]  1287 Discussed with [facility] hospitalist Dr. Doristine Bosworth who will evaluate the patient for admission.   [MB]    Clinical Course User Index [MB] Hayden Rasmussen, MD        Final Clinical Impressions(s) / ED Diagnoses   Final diagnoses:  AKI (acute kidney injury) ([place])  Acute on chronic congestive heart failure, unspecified heart failure type [facility])    ED Discharge Orders    None       Hayden Rasmussen, MD 10/04/18 1724

## 2018-10-04 NOTE — ED Notes (Signed)
Admitting provider at bedside.

## 2018-10-04 NOTE — ED Notes (Signed)
ED TO INPATIENT HANDOFF REPORT  ED Nurse Name and Phone #:  Clydene Laming [address] Name/Age/Gender Andrew Guerra 78 y.o. male Room/Bed: 026V/785Y  Code Status   Code Status: Full Code  Home/SNF/Other Home Patient oriented x4 Is this baseline? Yes  Triage Complete: Triage complete  Chief Complaint [facility]  Triage Note Pt presents with SOB "off and on" worsens with exertion x6 days. Dr. Posey Pronto called him 2 days ago and instructed him to come to ED for some abnormal blood work. Pt speaking in broken sentences 92% on 2L [place]     Allergies Allergies  Allergen Reactions  . Ambien [Zolpidem Tartrate] Other (See Comments)    Has hallucinations and Homicidal Ideations per Wife   . Atorvastatin     Achy and tired    Level of Care/Admitting Diagnosis ED Disposition    ED Disposition Condition [facility]: [place] [[id number]]  Level of Care: Telemetry Cardiac [103]  I expect the patient will be discharged within 24 hours: No (not a candidate for 5C-Observation unit)  Covid Evaluation: Asymptomatic Screening Protocol (No Symptoms)  Diagnosis: Acute on chronic renal failure [facility]) [850277]  Admitting Physician: Mckinley Jewel [[id number]]  Attending Physician: Mckinley Jewel [phone]  PT Class (Do Not Modify): Observation [104]  PT Acc Code (Do Not Modify): Observation [10022]       B Medical/Surgery History Past Medical History:  Diagnosis Date  . 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  [name] Cataract    right eye - surgery to remove  . Elevated PSA 11/29/2016   Patient [facility].  Most recent PSA near 11.  He has had atypia on biopsy.  November 2008  . Essential hypertension, benign 06/08/2012  . Full dentures   . Glaucoma   . Heart murmur    since childhood, never has caused any problems  .  History of rheumatic fever 11/04/2015  . Hyperlipidemia 04/25/2013  . Hypertension   . Malignant neoplasm of left lung ([place]) 02/08/2017  . RBBB 07/12/2017  . Sickle cell trait ([place])    no problems per patient   Past Surgical History:  Procedure Laterality Date  . BIOPSY  11/11/2017   Procedure: BIOPSY;  Surgeon: Lavena Bullion, DO;  Location: [facility];  Service: Gastroenterology;;  . BIOPSY  01/10/2018   Procedure: BIOPSY;  Surgeon: [name], MD;  Location: [place];  Service: Gastroenterology;;  . BIOPSY  06/22/2018   Procedure: BIOPSY;  Surgeon: Yetta Flock, MD;  Location: [facility] ENDOSCOPY;  Service: Gastroenterology;;  . cataract eye surgery Right   . COLONOSCOPY  11/2009   hx polyps/[place]  . COLONOSCOPY N/A 06/22/2018   Procedure: COLONOSCOPY;  Surgeon: Yetta Flock, MD;  Location: [place];  Service: Gastroenterology;  Laterality: N/A;  . ENTEROSCOPY N/A 06/22/2018   Procedure: PUSH ENTEROSCOPY;  Surgeon: Yetta Flock, MD;  Location: [facility] ENDOSCOPY;  Service: Gastroenterology;  Laterality: N/A;  . ESOPHAGOGASTRODUODENOSCOPY N/A 11/11/2017   Procedure: ESOPHAGOGASTRODUODENOSCOPY (EGD);  Surgeon: Lavena Bullion, DO;  Location: [facility] ENDOSCOPY;  Service: Gastroenterology;  Laterality: N/A;  . ESOPHAGOGASTRODUODENOSCOPY (EGD) WITH PROPOFOL N/A 01/10/2018   Procedure: ESOPHAGOGASTRODUODENOSCOPY (EGD) WITH PROPOFOL;  Surgeon: [name], MD;  Location: [place];  Service: Gastroenterology;  Laterality: N/A;  . HOT HEMOSTASIS N/A 06/22/2018   Procedure: HOT HEMOSTASIS (  ARGON PLASMA COAGULATION/BICAP);  Surgeon: Yetta Flock, MD;  Location: [facility] ENDOSCOPY;  Service: Gastroenterology;  Laterality: N/A;  . IR FLUORO GUIDE CV LINE RIGHT  01/16/2018  . IR FLUORO GUIDE CV LINE RIGHT  01/31/2018  . IR US GUIDE VASC ACCESS RIGHT  01/16/2018  . IR US GUIDE VASC ACCESS RIGHT  01/31/2018  . POLYPECTOMY  06/22/2018   Procedure: POLYPECTOMY;  Surgeon:  Yetta Flock, MD;  Location: [place];  Service: Gastroenterology;;  . PROSTATE BIOPSY    . right eye surgery     to lower eye pressure  . RIGHT HEART CATH N/A 01/19/2018   Procedure: RIGHT HEART CATH;  Surgeon: Nelva Bush, MD;  Location: [place] CV LAB;  Service: Cardiovascular;  Laterality: N/A;  . TONSILLECTOMY    . VIDEO ASSISTED THORACOSCOPY (VATS)/ LOBECTOMY Right 12/26/2015  . wisdom tteeth ext       A IV Location/Drains/Wounds Patient Lines/Drains/Airways Status   Active Line/Drains/Airways    Name:   Placement date:   Placement time:   Site:   Days:   Peripheral IV 10/04/18 Left Antecubital   10/04/18    1813    Antecubital   less than 1   Hemodialysis Catheter Right Internal jugular Double-lumen   01/31/18    1617    Internal jugular   246   Sheath 01/19/18 Right Venous   01/19/18    0743    Venous   258   Pressure Injury 01/16/18 Stage II -  Partial thickness loss of dermis presenting as a shallow open ulcer with a red, pink wound bed without slough.   01/16/18    1945     261   Pressure Injury Stage II -  Partial thickness loss of dermis presenting as a shallow open ulcer with a red, pink wound bed without slough.   -    -        Wound / Incision (Open or Dehisced) 01/25/18 Puncture Flank Right   01/25/18    1125    Flank   252          Intake/Output Last 24 hours No intake or output data in the 24 hours ending 10/04/18 2011  Labs/Imaging Results for orders placed or performed during the hospital encounter of 10/04/18 (from the past 48 hour(s))  Basic metabolic panel     Status: Abnormal   Collection Time: 10/04/18  1:20 PM  Result Value Ref Range   Sodium 139 135 - 145 mmol/L   Potassium 3.3 (L) 3.5 - 5.1 mmol/L   Chloride 102 98 - 111 mmol/L   CO2 22 22 - 32 mmol/L   Glucose, Bld 119 (H) 70 - 99 mg/dL   BUN 90 (H) 8 - 23 mg/dL   Creatinine, Ser 4.71 (H) 0.61 - 1.24 mg/dL   Calcium 8.5 (L) 8.9 - 10.3 mg/dL   GFR calc non Af Amer 11 (L) >60  mL/min   GFR calc Af Amer 13 (L) >60 mL/min   Anion gap 15 5 - 15    Comment: Performed at [facility], [address] [place], [place] [address]  CBC with Differential     Status: Abnormal   Collection Time: 10/04/18  1:20 PM  Result Value Ref Range   WBC 8.4 4.0 - 10.5 K/uL   RBC 4.74 4.22 - 5.81 MIL/uL   Hemoglobin 11.4 (L) 13.0 - 17.0 g/dL   HCT 36.9 (L) 39.0 - 52.0 %   MCV  77.8 (L) 80.0 - 100.0 fL   MCH 24.1 (L) 26.0 - 34.0 pg   MCHC 30.9 30.0 - 36.0 g/dL   RDW 22.3 (H) 11.5 - 15.5 %   Platelets 188 150 - 400 K/uL   nRBC 0.0 0.0 - 0.2 %   Neutrophils Relative % 75 %   Neutro Abs 6.3 1.7 - 7.7 K/uL   Lymphocytes Relative 15 %   Lymphs Abs 1.2 0.7 - 4.0 K/uL   Monocytes Relative 8 %   Monocytes Absolute 0.6 0.1 - 1.0 K/uL   Eosinophils Relative 1 %   Eosinophils Absolute 0.1 0.0 - 0.5 K/uL   Basophils Relative 0 %   Basophils Absolute 0.0 0.0 - 0.1 K/uL   Immature Granulocytes 1 %   Abs Immature Granulocytes 0.05 0.00 - 0.07 K/uL    Comment: Performed at [facility] [address] [place], [place] [address]  Brain natriuretic peptide     Status: Abnormal   Collection Time: 10/04/18  1:20 PM  Result Value Ref Range   B Natriuretic Peptide 3,681.8 (H) 0.0 - 100.0 pg/mL    Comment: Performed at [facility] [address] [place], [place] [address]  Protime-INR     Status: None   Collection Time: 10/04/18  1:20 PM  Result Value Ref Range   Prothrombin Time 14.1 11.4 - 15.2 seconds   INR 1.1 0.8 - 1.2    Comment: (NOTE) INR goal varies based on device and disease states. Performed at [facility], [place] [address] [place], [place] [address]   Troponin I (High Sensitivity)     Status: Abnormal   Collection Time: 10/04/18  1:20 PM  Result Value Ref Range   Troponin I (High Sensitivity) 103 (HH) <18 ng/L    Comment: CRITICAL RESULT CALLED TO, READ BACK BY AND VERIFIED WITH: E.PRYOR,RN 1421 10/04/2018 CLARK,S (NOTE) Elevated high sensitivity  troponin I (hsTnI) values and significant  changes across serial measurements may suggest ACS but many other  chronic and acute conditions are known to elevate hsTnI results.  Refer to the Links section for chest pain algorithms and additional  guidance. Performed at [facility], [place] [address], [place], [place] [address]   Urinalysis, Routine w reflex microscopic     Status: Abnormal   Collection Time: 10/04/18  2:25 PM  Result Value Ref Range   Color, Urine YELLOW YELLOW   APPearance HAZY (A) CLEAR   Specific Gravity, Urine 1.015 1.005 - 1.030   pH 6.0 5.0 - 8.0   Glucose, UA 50 (A) NEGATIVE mg/dL   Hgb urine dipstick SMALL (A) NEGATIVE   Bilirubin Urine NEGATIVE NEGATIVE   Ketones, ur NEGATIVE NEGATIVE mg/dL   Protein, ur >=300 (A) NEGATIVE mg/dL   Nitrite NEGATIVE NEGATIVE   Leukocytes,Ua MODERATE (A) NEGATIVE   RBC / HPF 6-10 0 - 5 RBC/hpf   WBC, UA >50 (H) 0 - 5 WBC/hpf   Bacteria, UA RARE (A) NONE SEEN   Squamous Epithelial / LPF 0-5 0 - 5   WBC Clumps PRESENT    Mucus PRESENT     Comment: Performed at [facility], [place] [address] [place], [place] [address]  Troponin I (High Sensitivity)     Status: Abnormal   Collection Time: 10/04/18  3:21 PM  Result Value Ref Range   Troponin I (High Sensitivity) 108 (HH) <18 ng/L    Comment: CRITICAL VALUE NOTED.  VALUE IS CONSISTENT WITH PREVIOUSLY  REPORTED AND CALLED VALUE. (NOTE) Elevated high sensitivity troponin I (hsTnI) values and significant  changes across serial measurements may suggest ACS but many other  chronic and acute conditions are known to elevate hsTnI results.  Refer to the Links section for chest pain algorithms and additional  guidance. Performed at [facility], [place] [address], [place], [place] [address]   Magnesium     Status: None   Collection Time: 10/04/18  4:28 PM  Result Value Ref Range   Magnesium 2.0 1.7 - 2.4 mg/dL    Comment: Performed at [facility], [place]  [address], [place], [place] [address]  Phosphorus     Status: None   Collection Time: 10/04/18  4:28 PM  Result Value Ref Range   Phosphorus 4.5 2.5 - 4.6 mg/dL    Comment: Performed at [facility], [place] [address] [place], [place] [address]  Lipid panel     Status: Abnormal   Collection Time: 10/04/18  4:28 PM  Result Value Ref Range   Cholesterol 195 0 - 200 mg/dL   Triglycerides 84 <150 mg/dL   HDL 63 >40 mg/dL   Total CHOL/HDL Ratio 3.1 RATIO   VLDL 17 0 - 40 mg/dL   LDL Cholesterol 115 (H) 0 - 99 mg/dL    Comment:        Total Cholesterol/HDL:CHD Risk Coronary Heart Disease Risk Table                     Men   Women  1/2 Average Risk   3.4   3.3  Average Risk       5.0   4.4  2 X Average Risk   9.6   7.1  3 X Average Risk  23.4   11.0        Use the calculated Patient Ratio above and the CHD Risk Table to determine the patient's CHD Risk.        ATP III CLASSIFICATION (LDL):  <100     mg/dL   Optimal  100-129  mg/dL   Near or Above                    Optimal  130-159  mg/dL   Borderline  160-189  mg/dL   High  >190     mg/dL   Very High Performed at [facility] [address] [place], [place] [address]   TSH     Status: None   Collection Time: 10/04/18  6:22 PM  Result Value Ref Range   TSH 4.227 0.350 - 4.500 uIU/mL    Comment: Performed by a 3rd Generation assay with a functional sensitivity of <=0.01 uIU/mL. Performed at [facility], [place] [address] [place], [place] [address]   Troponin I (High Sensitivity)     Status: Abnormal   Collection Time: 10/04/18  6:22 PM  Result Value Ref Range   Troponin I (High Sensitivity) 93 (H) <18 ng/L    Comment: (NOTE) Elevated high sensitivity troponin I (hsTnI) values and significant  changes across serial measurements may suggest ACS but many other  chronic and acute conditions are known to elevate hsTnI results.  Refer to the "Links" section for chest pain algorithms and additional  guidance. Performed  at [facility], [place] [address], [place], [place] [address]    Dg Chest 2 View  Result Date: 10/04/2018 CLINICAL DATA:  [age]-year-old male with  shortness of breath. EXAM: CHEST - 2 VIEW COMPARISON:  Chest radiograph dated 02/03/2018 FINDINGS: Interval removal of the right-sided dialysis catheter. Small bilateral pleural effusions with bibasilar atelectasis or infiltrate. No pneumothorax. Stable cardiac silhouette. No acute osseous pathology. IMPRESSION: Small bilateral pleural effusions with bibasilar atelectasis or infiltrate. Electronically Signed   By: Anner Crete M.D.   On: 10/04/2018 13:15   US Renal  Result Date: 10/04/2018 CLINICAL DATA:  Acute on chronic renal failure EXAM: RENAL / URINARY TRACT ULTRASOUND COMPLETE COMPARISON:  01/13/2018, CT 01/09/2018 FINDINGS: Right Kidney: Renal measurements: 9.9 x 4.6 x 6.6 cm = volume: 156 mL. Increased cortical echogenicity. No hydronephrosis. Multiple, fewer than 10 cysts within the right kidney. Upper pole cyst measures 1.8 by 1.6 x 1.5 cm. Midpole cyst measures 0.7 x 1 by 0.6 cm. Lower pole septated cyst measures 1.4 by 1.7 x 1.3 cm. Left Kidney: Renal measurements: 11.2 x 6.4 x 5.4 cm = volume: 203 mL. Increased cortical echogenicity without hydronephrosis. Multiple, fewer than 10 cysts within each kidney. Upper pole cyst measures 1.8 x 1.9 by 1.4 cm. Midpole cyst measures 2.4 x 1.5 by 1.9 cm. Lower pole cyst measures 1.6 by 1.3 x 1.1 cm. Bladder: Appears normal for degree of bladder distention. Enlarged prostate measuring 5.3 x 3.9 x 4.5 cm. Small amount of free fluid in the right upper quadrant. Incidental note made of bilateral pleural effusions. IMPRESSION: 1. Echogenic kidneys consistent with medical renal disease. No hydronephrosis. Bilateral renal cysts, fewer than 10 within each kidney. 2. Small amount of right upper quadrant ascites 3. Bilateral pleural effusions 4. Prostatomegaly Electronically Signed   By: Donavan Foil M.D.    On: 10/04/2018 18:01    Pending Labs Unresulted Labs (From admission, onward)    Start     Ordered   10/11/18 0500  Creatinine, serum  (enoxaparin (LOVENOX)    CrCl < 30 ml/min)  Weekly,   R    Comments: while on enoxaparin therapy.    10/04/18 1634   10/05/18 0500  Comprehensive metabolic panel  Tomorrow morning,   R     10/04/18 1634   10/05/18 0500  CBC  Tomorrow morning,   R     10/04/18 1634   10/04/18 1516  SARS CORONAVIRUS 2 (TAT 6-24 HRS) Nasopharyngeal Nasopharyngeal Swab  (Asymptomatic/Tier 2 Patients Labs)  Once,   STAT    Question Answer Comment  Is this test for diagnosis or screening Screening   Symptomatic for COVID-19 as defined by CDC No   Hospitalized for COVID-19 No   Admitted to ICU for COVID-19 No   Previously tested for COVID-19 Yes   Resident in a congregate (group) care setting No   Employed in healthcare setting No      10/04/18 1516          Vitals/Pain Today's Vitals   10/04/18 1821 10/04/18 1917 10/04/18 1922 10/04/18 2008  BP:  (!) 175/80  (!) 172/84  Pulse:  88  92  Resp:  16  18  Temp:  98.6 F (37 C)    TempSrc:  Oral    SpO2:  97%  94%  PainSc: 0-No pain  0-No pain     Isolation Precautions No active isolations  Medications Medications  enoxaparin (LOVENOX) injection 30 mg (30 mg Subcutaneous Given 10/04/18 2007)  acetaminophen (TYLENOL) tablet 650 mg (has no administration in time range)    Or  acetaminophen (TYLENOL) suppository 650 mg (has no administration in time range)  ondansetron (  ZOFRAN) tablet 4 mg (has no administration in time range)    Or  ondansetron (ZOFRAN) injection 4 mg (has no administration in time range)  aspirin EC tablet 81 mg (81 mg Oral Given 10/04/18 1759)  amLODipine (NORVASC) tablet 2.5 mg (has no administration in time range)  pantoprazole (PROTONIX) EC tablet 40 mg (has no administration in time range)  cholecalciferol (VITAMIN D3) tablet 1,000 Units (has no administration in time range)   hydrALAZINE (APRESOLINE) injection 10 mg (10 mg Intravenous Given 10/04/18 1920)  furosemide (LASIX) injection 40 mg (40 mg Intravenous Given 10/04/18 1818)  potassium chloride SA (K-DUR) CR tablet 40 mEq (40 mEq Oral Given 10/04/18 1800)    Mobility walks     Focused Assessments Denies chest pain , family notified on pt.'s admission    R Recommendations: See Admitting Provider Note  Report given to:   Additional Notes:

## 2018-10-04 NOTE — ED Notes (Signed)
Patient transported to Ultrasound 

## 2018-10-04 NOTE — ED Notes (Signed)
Hooked patient back up to the monitor patient is resting with family at bedside and call bell in reach

## 2018-10-04 NOTE — ED Notes (Signed)
Attempted to call report

## 2018-10-04 NOTE — H&P (Signed)
History and Physical    Andrew Guerra WIO:[id number] DOB: May 28, 1940 DOA: 10/04/2018  PCP: [name], MD  Patient coming from: Home  I have personally briefly reviewed patient's old medical records in [place]  Chief Complaint: worsening shortness of breath & leg swelling  HPI: Andrew Guerra is a 78 y.o. 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  He was asked by his PCP on Friday to go to the emergency department due to worsening kidney function.  Patient reports that he came today because of exertional shortness of breath, worsening leg swelling.  Denies chest pain, palpitation, fever, chills, cough, congestion, nausea, vomiting, abdominal pain, hematemesis, melena, lightheadedness, dizziness, orthopnea, PND, sleep or appetite changes.  He is compliant with his medications and denies smoking, alcohol, illicit drug use.  ED Course: Patient sitting on the edge of the bed, on 2 L of oxygen via nasal cannula, not in acute distress, communicating well, alert and oriented.  Wife at bedside.  BNP is elevated more than 3000, troponin is elevated at 103.  Found to have hypokalemia, worsening renal function.  EDP spoke to nephrologist [facility]) for further eval & management.  Review of Systems: As per HPI otherwise negative.    Past Medical History:  Diagnosis Date  . 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 02/08/2017   moderate by echo 10/2017 with mean AVG 9mmHg and dimensionless index 0.31 consistent with moderate AS.  [name] Cataract    right eye - surgery to remove  . Elevated PSA  11/29/2016   Patient is followed by [facility].  Most recent PSA near 11.  He has had atypia on biopsy.  November 2008  . Essential hypertension, benign 06/08/2012  . Full dentures   . Glaucoma   . Heart murmur    since childhood, never has caused any problems  . History of rheumatic fever 11/04/2015  . Hyperlipidemia 04/25/2013  . Hypertension   . Malignant neoplasm of left lung ([place]) 02/08/2017  . RBBB 07/12/2017  . Sickle cell trait ([place])    no problems per patient    Past Surgical History:  Procedure Laterality Date  . BIOPSY  11/11/2017   Procedure: BIOPSY;  Surgeon: Lavena Bullion, DO;  Location: [place] ENDOSCOPY;  Service: Gastroenterology;;  . BIOPSY  01/10/2018   Procedure: BIOPSY;  Surgeon: [name], MD;  Location: [place];  Service: Gastroenterology;;  . BIOPSY  06/22/2018   Procedure: BIOPSY;  Surgeon: Yetta Flock, MD;  Location: [facility] ENDOSCOPY;  Service: Gastroenterology;;  . cataract eye surgery Right   . COLONOSCOPY  11/2009   hx polyps/[place]  . COLONOSCOPY N/A 06/22/2018   Procedure: COLONOSCOPY;  Surgeon: Yetta Flock, MD;  Location: [place];  Service: Gastroenterology;  Laterality: N/A;  . ENTEROSCOPY N/A 06/22/2018   Procedure: PUSH ENTEROSCOPY;  Surgeon: Yetta Flock, MD;  Location: [facility] ENDOSCOPY;  Service: Gastroenterology;  Laterality: N/A;  . ESOPHAGOGASTRODUODENOSCOPY N/A 11/11/2017   Procedure: ESOPHAGOGASTRODUODENOSCOPY (EGD);  Surgeon: Lavena Bullion, DO;  Location: [facility] ENDOSCOPY;  Service: Gastroenterology;  Laterality: N/A;  . ESOPHAGOGASTRODUODENOSCOPY (EGD) WITH PROPOFOL N/A 01/10/2018   Procedure: ESOPHAGOGASTRODUODENOSCOPY (EGD) WITH PROPOFOL;  Surgeon: [name], MD;  Location: [facility] ENDOSCOPY;  Service: Gastroenterology;  Laterality: N/A;  . HOT HEMOSTASIS N/A 06/22/2018   Procedure: HOT HEMOSTASIS (ARGON PLASMA COAGULATION/BICAP);  Surgeon: Yetta Flock, MD;  Location: [facility] ENDOSCOPY;  Service:  Gastroenterology;  Laterality: N/A;  . IR FLUORO GUIDE CV LINE RIGHT  01/16/2018  . IR FLUORO GUIDE CV LINE RIGHT  01/31/2018  . IR US GUIDE VASC ACCESS RIGHT  01/16/2018  . IR US GUIDE VASC ACCESS RIGHT  01/31/2018  . POLYPECTOMY  06/22/2018   Procedure: POLYPECTOMY;  Surgeon: Yetta Flock, MD;  Location: [place];  Service: Gastroenterology;;  . PROSTATE BIOPSY    . right eye surgery     to lower eye pressure  . RIGHT HEART CATH N/A 01/19/2018   Procedure: RIGHT HEART CATH;  Surgeon: Nelva Bush, MD;  Location: [place] CV LAB;  Service: Cardiovascular;  Laterality: N/A;  . TONSILLECTOMY    . VIDEO ASSISTED THORACOSCOPY (VATS)/ LOBECTOMY Right 12/26/2015  . wisdom tteeth ext       reports that he quit smoking about 7 years ago. His smoking use included cigarettes. He smoked 1.00 pack per day. He has never used smokeless tobacco. He reports current alcohol use of about 1.0 standard drinks of alcohol per week. He reports that he does not use drugs.  Allergies  Allergen Reactions  . Ambien [Zolpidem Tartrate] Other (See Comments)    Has hallucinations and Homicidal Ideations per Wife   . Atorvastatin     Achy and tired    Family History  Problem Relation Age of Onset  . Diabetes Mother   . Cerebral aneurysm Mother   . Lung cancer Father   . Diabetes Sister   . Colon cancer Neg Hx   . Colon polyps Neg Hx   . Esophageal cancer Neg Hx   . Rectal cancer Neg Hx   . Stomach cancer Neg Hx     Prior to Admission medications   Medication Sig Start Date End Date Taking? Authorizing Provider  amLODipine (NORVASC) 2.5 MG tablet TAKE 1 TABLET BY MOUTH EVERY DAY Patient taking differently: Take 2.5 mg by mouth daily.  09/26/18  Yes Luking, Elayne Snare, MD  Brinzolamide-Brimonidine (SIMBRINZA) 1-0.2 % SUSP Place 2-3 drops into both eyes 3 (three) times daily.    Yes [provider]  cholecalciferol (VITAMIN D3) 25 MCG (1000 UT) tablet Take 1,000 Units by mouth daily.    Yes [provider]  Multiple Vitamin (MULTIVITAMIN) tablet Take 1 tablet by mouth daily. With vitamin D   Yes [provider]  Netarsudil-Latanoprost (ROCKLATAN) 0.02-0.005 % SOLN Place 1 drop into both eyes at bedtime.    Yes Marylynn Pearson, MD  Nutritional Supplements (FEEDING SUPPLEMENT, NEPRO CARB STEADY,) LIQD Take 237 mLs by mouth 2 (two) times daily.   Yes [provider]  pantoprazole (PROTONIX) 40 MG tablet TAKE 1 TABLET BY MOUTH TWICE A DAY Patient taking differently: Take 40 mg by mouth 2 (two) times daily.  09/24/18  Yes [name], MD  torsemide (DEMADEX) 100 MG tablet Take 1 tablet (100 mg total) by mouth daily. Patient taking differently: Take 100 mg by mouth 2 (two) times daily.  07/12/18  Yes Luking, Elayne Snare, MD  XIIDRA 5 % SOLN Place 1 drop into both eyes 2 (two) times daily as needed (Dry eye).  03/27/18  Yes [provider]    Physical Exam: Vitals:   10/04/18 1700 10/04/18 1800 10/04/18 1917 10/04/18 2008  BP: (!) 208/113 (!) 192/111 [name])  175/80 (!) 172/84  Pulse: 89 86 88 92  Resp: (!) 23 (!) 24 16 18   Temp:   98.6 F (37 C)   TempSrc:   Oral   SpO2: 98% 93% 97% 94%    Constitutional: NAD, calm, comfortable Vitals:   10/04/18 1700 10/04/18 1800 10/04/18 1917 10/04/18 2008  BP: (!) 208/113 (!) 192/111 (!) 175/80 (!) 172/84  Pulse: 89 86 88 92  Resp: (!) 23 (!) 24 16 18   Temp:   98.6 F (37 C)   TempSrc:   Oral   SpO2: 98% 93% 97% 94%   Constitutional: Not in acute distress, on 2 L of oxygen via nasal cannula, alert and oriented x3, communicating well.   Eyes: PERRL, lids and conjunctivae normal ENMT: Mucous membranes are moist. Posterior pharynx clear of any exudate or lesions.Normal dentition.  Neck: normal, supple, no masses, no thyromegaly Respiratory: clear to auscultation bilaterally, no wheezing, no crackles. Normal respiratory effort. No accessory muscle use.  Cardiovascular: Regular rate and rhythm, no murmurs /  rubs / gallops.  2+ bilateral pitting edema positive. 2+ pedal pulses. No carotid bruits.  Abdomen: no tenderness, no masses palpated. No hepatosplenomegaly. Bowel sounds positive.  Musculoskeletal: no clubbing / cyanosis. No joint deformity upper and lower extremities. Good ROM, no contractures. Normal muscle tone.  Skin: no rashes, lesions, ulcers. No induration Neurologic: CN 2-12 grossly intact. Sensation intact, DTR normal. Strength 5/5 in all 4.  Psychiatric: Normal judgment and insight. Alert and oriented x 3. Normal mood.    Labs on Admission: I have personally reviewed following labs and imaging studies  CBC: Recent Labs  Lab 10/02/18 0830 10/02/18 0839 10/04/18 1320  WBC 7.2  --  8.4  NEUTROABS 5.6  --  6.3  HGB 11.5* 11.7* 11.4*  HCT 36.7*  --  36.9*  MCV 77.3*  --  77.8*  PLT 191  --  756   Basic Metabolic Panel: Recent Labs  Lab 10/02/18 0830 10/04/18 1320 10/04/18 1628  NA 142 139  --   K 3.2* 3.3*  --   CL 102 102  --   CO2 26 22  --   GLUCOSE 121* 119*  --   BUN 92* 90*  --   CREATININE 4.94* 4.71*  --   CALCIUM 8.3* 8.5*  --   MG 2.2  --  2.0  PHOS 4.7*  --  4.5   GFR: CrCl cannot be calculated (Unknown ideal weight.). Liver Function Tests: Recent Labs  Lab 10/02/18 0830  ALBUMIN 2.2*   No results for input(s): LIPASE, AMYLASE in the last 168 hours. No results for input(s): AMMONIA in the last 168 hours. Coagulation Profile: Recent Labs  Lab 10/04/18 1320  INR 1.1   Cardiac Enzymes: No results for input(s): CKTOTAL, CKMB, CKMBINDEX, TROPONINI in the last 168 hours. BNP (last 3 results) No results for input(s): PROBNP in the last 8760 hours. HbA1C: No results for input(s): HGBA1C in the last 72 hours. CBG: No results for input(s): GLUCAP in the last 168 hours. Lipid Profile: Recent Labs    10/04/18 1628  CHOL 195  HDL 63  LDLCALC 115*  TRIG 84  CHOLHDL 3.1   Thyroid Function Tests: Recent Labs    10/04/18 1822  TSH 4.227    Anemia Panel: Recent Labs    10/02/18 0830  FERRITIN 160  TIBC 305  IRON 23*   Urine analysis:    Component Value Date/Time   COLORURINE YELLOW 10/04/2018 [place]  HAZY (A) 10/04/2018 1425   LABSPEC 1.015 10/04/2018 1425   PHURINE 6.0 10/04/2018 1425   GLUCOSEU 50 (A) 10/04/2018 1425   HGBUR SMALL (A) 10/04/2018 1425   BILIRUBINUR NEGATIVE 10/04/2018 1425   KETONESUR NEGATIVE 10/04/2018 1425   PROTEINUR >=300 (A) 10/04/2018 1425   NITRITE NEGATIVE 10/04/2018 1425   LEUKOCYTESUR MODERATE (A) 10/04/2018 1425    Radiological Exams on Admission: Dg Chest 2 View  Result Date: 10/04/2018 CLINICAL DATA:  [age]-year-old male with shortness of breath. EXAM: CHEST - 2 VIEW COMPARISON:  Chest radiograph dated 02/03/2018 FINDINGS: Interval removal of the right-sided dialysis catheter. Small bilateral pleural effusions with bibasilar atelectasis or infiltrate. No pneumothorax. Stable cardiac silhouette. No acute osseous pathology. IMPRESSION: Small bilateral pleural effusions with bibasilar atelectasis or infiltrate. Electronically Signed   By: Anner Crete M.D.   On: 10/04/2018 13:15   US Renal  Result Date: 10/04/2018 CLINICAL DATA:  Acute on chronic renal failure EXAM: RENAL / URINARY TRACT ULTRASOUND COMPLETE COMPARISON:  01/13/2018, CT 01/09/2018 FINDINGS: Right Kidney: Renal measurements: 9.9 x 4.6 x 6.6 cm = volume: 156 mL. Increased cortical echogenicity. No hydronephrosis. Multiple, fewer than 10 cysts within the right kidney. Upper pole cyst measures 1.8 by 1.6 x 1.5 cm. Midpole cyst measures 0.7 x 1 by 0.6 cm. Lower pole septated cyst measures 1.4 by 1.7 x 1.3 cm. Left Kidney: Renal measurements: 11.2 x 6.4 x 5.4 cm = volume: 203 mL. Increased cortical echogenicity without hydronephrosis. Multiple, fewer than 10 cysts within each kidney. Upper pole cyst measures 1.8 x 1.9 by 1.4 cm. Midpole cyst measures 2.4 x 1.5 by 1.9 cm. Lower pole cyst measures 1.6 by 1.3 x 1.1 cm.  Bladder: Appears normal for degree of bladder distention. Enlarged prostate measuring 5.3 x 3.9 x 4.5 cm. Small amount of free fluid in the right upper quadrant. Incidental note made of bilateral pleural effusions. IMPRESSION: 1. Echogenic kidneys consistent with medical renal disease. No hydronephrosis. Bilateral renal cysts, fewer than 10 within each kidney. 2. Small amount of right upper quadrant ascites 3. Bilateral pleural effusions 4. Prostatomegaly Electronically Signed   By: Donavan Foil M.D.   On: 10/04/2018 18:01    EKG: NSR, RBBB, no acute ST-T wave changes noted.  Assessment/Plan Active Problems:   Essential hypertension, benign   Hyperlipidemia   Malignant neoplasm of left lung (HCC)   Acute on chronic renal failure (HCC)   Exertional shortness of breath and leg swelling: -Likely secondary to fluid overload secondary to worsening kidney function versus acute on chronic diastolic congestive heart failure. -proBNP is elevated which could be secondary to worsening kidney function, troponin elevated-trending down-could be secondary to demand ischemia.  EKG no acute changes.  Patient denies ACS symptoms -Will get transthoracic echo and renal ultrasound. -Admit patient under observation.  On continuous pulse ox. -Started on Lasix 40 mg IV twice daily.  Strict INO's and daily weight.  Monitor electrolytes. -EDP consulted nephrology-await recommendation.   Hypokalemia: -We will replace potassium and monitor BMP  Acute on chronic kidney disease stage IV: -Avoid nephrotoxic medication. -Nephrology consulted by EDP -Repeat BMP tomorrow a.m.  Chronic hypoxic respiratory failure: -Patient had history of malignant neoplasm of left lung -He is on 2 L of oxygen via nasal cannulae at home-on continuous pulse ox  Hypertension: Stable -Continue amlodipine. -Monitor blood pressure closely.  Bicuspid aortic valve with moderate aortic stenosis: Stable  NSCLC: Treated with right upper  lobectomy subsequently required radiation. -Stable.  On 2 L of  oxygen via nasal cannula.  History of PUD: -Patient denies any symptoms such as nausea, vomiting, epigastric pain.  H&H is stable -Avoid NSAIDs.  Continue Protonix.  DVT prophylaxis: Lovenox, TED/SCD  code Status: Full code Family Communication: Wife present at bedside.  Plan of care discussed with patient and his wife in length and he verbalized understanding and agreed with it. Disposition Plan: To be determined  consults called: Nephrology Dr. Hollie Salk by EDP Admission status: Observation  Mckinley Jewel MD [facility] Pager [phone]  If 7PM-7AM, please contact night-coverage [email or web address] Password [email or web address]  10/04/2018, 8:27 PM

## 2018-10-04 NOTE — Plan of Care (Signed)
  Problem: Coping: Goal: Level of anxiety will decrease Outcome: Progressing   Problem: Safety: Goal: Ability to remain free from injury will improve Outcome: Progressing   Problem: Clinical Measurements: Goal: Respiratory complications will improve Outcome: Not Progressing  Pt. Continues to be SOB, wheezing noted Problem: Activity: Goal: Risk for activity intolerance will decrease Outcome: Not Progressing  Pt. SOB with exertion

## 2018-10-04 NOTE — ED Notes (Signed)
Patient transported to X-ray 

## 2018-10-05 ENCOUNTER — Observation Stay (HOSPITAL_BASED_OUTPATIENT_CLINIC_OR_DEPARTMENT_OTHER): Payer: Medicare Other

## 2018-10-05 ENCOUNTER — Encounter: Payer: Self-pay | Admitting: Family Medicine

## 2018-10-05 DIAGNOSIS — I342 Nonrheumatic mitral (valve) stenosis: Secondary | ICD-10-CM

## 2018-10-05 DIAGNOSIS — C3412 Malignant neoplasm of upper lobe, left bronchus or lung: Secondary | ICD-10-CM | POA: Diagnosis not present

## 2018-10-05 DIAGNOSIS — I361 Nonrheumatic tricuspid (valve) insufficiency: Secondary | ICD-10-CM | POA: Diagnosis not present

## 2018-10-05 DIAGNOSIS — N179 Acute kidney failure, unspecified: Secondary | ICD-10-CM | POA: Diagnosis not present

## 2018-10-05 DIAGNOSIS — E7849 Other hyperlipidemia: Secondary | ICD-10-CM | POA: Diagnosis not present

## 2018-10-05 DIAGNOSIS — I10 Essential (primary) hypertension: Secondary | ICD-10-CM | POA: Diagnosis not present

## 2018-10-05 LAB — BLOOD GAS, ARTERIAL
Acid-Base Excess: 3 mmol/L — ABNORMAL HIGH (ref 0.0–2.0)
Bicarbonate: 27.2 mmol/L (ref 20.0–28.0)
Drawn by: 21338
O2 Content: 6 L/min
O2 Saturation: 94.8 %
Patient temperature: 98.6
pCO2 arterial: 43.2 mmHg (ref 32.0–48.0)
pH, Arterial: 7.416 (ref 7.350–7.450)
pO2, Arterial: 76.1 mmHg — ABNORMAL LOW (ref 83.0–108.0)

## 2018-10-05 LAB — CBC WITH DIFFERENTIAL/PLATELET
Abs Immature Granulocytes: 0 10*3/uL (ref 0.00–0.07)
Basophils Absolute: 0 10*3/uL (ref 0.0–0.1)
Basophils Relative: 0 %
Eosinophils Absolute: 0 10*3/uL (ref 0.0–0.5)
Eosinophils Relative: 0 %
HCT: 34.5 % — ABNORMAL LOW (ref 39.0–52.0)
Hemoglobin: 11.2 g/dL — ABNORMAL LOW (ref 13.0–17.0)
Lymphocytes Relative: 1 %
Lymphs Abs: 0.1 10*3/uL — ABNORMAL LOW (ref 0.7–4.0)
MCH: 24.8 pg — ABNORMAL LOW (ref 26.0–34.0)
MCHC: 32.5 g/dL (ref 30.0–36.0)
MCV: 76.3 fL — ABNORMAL LOW (ref 80.0–100.0)
Monocytes Absolute: 0.3 10*3/uL (ref 0.1–1.0)
Monocytes Relative: 4 %
Neutro Abs: 7.8 10*3/uL — ABNORMAL HIGH (ref 1.7–7.7)
Neutrophils Relative %: 95 %
Platelets: 194 10*3/uL (ref 150–400)
RBC: 4.52 MIL/uL (ref 4.22–5.81)
RDW: 22.1 % — ABNORMAL HIGH (ref 11.5–15.5)
WBC: 8.2 10*3/uL (ref 4.0–10.5)
nRBC: 0.2 % (ref 0.0–0.2)
nRBC: 1 /100 WBC — ABNORMAL HIGH

## 2018-10-05 LAB — COMPREHENSIVE METABOLIC PANEL
ALT: 25 U/L (ref 0–44)
AST: 25 U/L (ref 15–41)
Albumin: 2.2 g/dL — ABNORMAL LOW (ref 3.5–5.0)
Alkaline Phosphatase: 89 U/L (ref 38–126)
Anion gap: 17 — ABNORMAL HIGH (ref 5–15)
BUN: 91 mg/dL — ABNORMAL HIGH (ref 8–23)
CO2: 24 mmol/L (ref 22–32)
Calcium: 8.5 mg/dL — ABNORMAL LOW (ref 8.9–10.3)
Chloride: 103 mmol/L (ref 98–111)
Creatinine, Ser: 5.04 mg/dL — ABNORMAL HIGH (ref 0.61–1.24)
GFR calc Af Amer: 12 mL/min — ABNORMAL LOW (ref >60)
GFR calc non Af Amer: 10 mL/min — ABNORMAL LOW (ref >60)
Glucose, Bld: 96 mg/dL (ref 70–99)
Potassium: 3.5 mmol/L (ref 3.5–5.1)
Sodium: 144 mmol/L (ref 135–145)
Total Bilirubin: 0.7 mg/dL (ref 0.3–1.2)
Total Protein: 5.6 g/dL — ABNORMAL LOW (ref 6.5–8.1)

## 2018-10-05 LAB — SARS CORONAVIRUS 2 (TAT 6-24 HRS): SARS Coronavirus 2: NEGATIVE

## 2018-10-05 LAB — ECHOCARDIOGRAM COMPLETE: Weight: 3439.18 oz

## 2018-10-05 MED ORDER — IPRATROPIUM-ALBUTEROL 0.5-2.5 (3) MG/3ML IN SOLN
3.0000 mL | Freq: Four times a day (QID) | RESPIRATORY_TRACT | Status: DC
  Administered 2018-10-05 (×3): 3 mL via RESPIRATORY_TRACT
  Filled 2018-10-05 (×3): qty 3

## 2018-10-05 MED ORDER — INFLUENZA VAC A&B SA ADJ QUAD 0.5 ML IM PRSY
0.5000 mL | PREFILLED_SYRINGE | INTRAMUSCULAR | Status: DC
  Filled 2018-10-05: qty 0.5

## 2018-10-05 MED ORDER — FUROSEMIDE 10 MG/ML IJ SOLN
160.0000 mg | Freq: Three times a day (TID) | INTRAVENOUS | Status: DC
  Administered 2018-10-05 – 2018-10-12 (×21): 160 mg via INTRAVENOUS
  Filled 2018-10-05: qty 10
  Filled 2018-10-05: qty 16
  Filled 2018-10-05: qty 10
  Filled 2018-10-05: qty 16
  Filled 2018-10-05: qty 4
  Filled 2018-10-05 (×2): qty 16
  Filled 2018-10-05: qty 10
  Filled 2018-10-05: qty 16
  Filled 2018-10-05 (×2): qty 10
  Filled 2018-10-05 (×3): qty 16
  Filled 2018-10-05: qty 10
  Filled 2018-10-05 (×3): qty 16
  Filled 2018-10-05: qty 10
  Filled 2018-10-05 (×2): qty 16
  Filled 2018-10-05: qty 10
  Filled 2018-10-05: qty 6
  Filled 2018-10-05 (×2): qty 10
  Filled 2018-10-05: qty 16
  Filled 2018-10-05: qty 2
  Filled 2018-10-05: qty 16

## 2018-10-05 MED ORDER — HEPARIN SODIUM (PORCINE) 5000 UNIT/ML IJ SOLN
5000.0000 [IU] | Freq: Three times a day (TID) | INTRAMUSCULAR | Status: DC
  Administered 2018-10-05 – 2018-10-11 (×17): 5000 [IU] via SUBCUTANEOUS
  Filled 2018-10-05 (×17): qty 1

## 2018-10-05 MED ORDER — AMLODIPINE BESYLATE 5 MG PO TABS
5.0000 mg | ORAL_TABLET | Freq: Every day | ORAL | Status: DC
  Administered 2018-10-05 – 2018-10-13 (×9): 5 mg via ORAL
  Filled 2018-10-05 (×9): qty 1

## 2018-10-05 MED ORDER — ALBUTEROL SULFATE (2.5 MG/3ML) 0.083% IN NEBU: 2.5000 mg | INHALATION_SOLUTION | Freq: Four times a day (QID) | RESPIRATORY_TRACT | Status: DC | PRN

## 2018-10-05 MED ORDER — IPRATROPIUM-ALBUTEROL 0.5-2.5 (3) MG/3ML IN SOLN
3.0000 mL | Freq: Three times a day (TID) | RESPIRATORY_TRACT | Status: DC
  Administered 2018-10-06 – 2018-10-10 (×10): 3 mL via RESPIRATORY_TRACT
  Filled 2018-10-05 (×12): qty 3

## 2018-10-05 NOTE — Progress Notes (Signed)
  Echocardiogram 2D Echocardiogram has been performed.  Reola Buckles G Irelynd Zumstein 10/05/2018, 10:31 AM

## 2018-10-05 NOTE — Progress Notes (Signed)
PROGRESS NOTE  Andrew Guerra WIO:[id number] DOB: December 29, 1940 DOA: 10/04/2018 PCP: [name], MD  HPI/Recap of past 24 hours: HPI from Dr Doristine Bosworth Andrew Guerra is a 78 y.o. 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 He was asked by his PCP on Friday to go to the emergency department due to worsening kidney function.  Patient reports exertional shortness of breath, worsening leg swelling. In the ED, BNP is elevated more than 3000, troponin is elevated at 108-->93. Found to have worsening renal function.  TRH called for admission.    Today, patient was noted to be short of breath at rest, denies any chest pain, diaphoresis, nausea/vomiting, abdominal pain, fever/chills.    Assessment/Plan: Active Problems:   Essential hypertension, benign   Hyperlipidemia   Malignant neoplasm of left lung (HCC)   Acute on chronic renal failure (HCC)   Acute on CKD stage IV Baseline creatinine around 2.7, on admission 4.9 Noted recent weight gain, progressive shortness of breath/lower extremity edema Creatinine has been progressively getting worse, has been followed by Dr. Posey Pronto as an outpatient, with recent medication adjustments Renal ultrasound showed echogenic kidneys consistent with medical renal disease, no hydronephrosis, ascites, bilateral pleural effusions Nephrology consulted, increase IV Lasix, may add metolazone if no adequate diuresis, may require dialysis on this admission Continue IV Lasix Strict I's and O's, renally dose all meds, daily weights Daily BMP   Acute on chronic hypoxic  respiratory failure/acute on chronic diastolic HF/bicuspid aortic valve with moderate aortic stenosis Multifactorial, fluid overload/anasarca, history of lung cancer ABG showed hypoxia proBNP elevated Troponin slightly elevated, trended down, EKG with no acute ST changes Chest x-ray showed bilateral pleural effusion Echo showed EF of 55 to 60%, right ventricular pressure overload, basal/mid inferior wall and basal/mid inferolateral wall abnormality, pleural effusion Consider cardiology consult Continue supplemental O2, continuous pulse ox, duo nebs Continue IV Lasix Strict I's and O's, daily weight  Hypertension Uncontrolled Increased home amlodipine to 5 mg daily, IV hydralazine PRN, IV Lasix Monitor closely  Anemia of chronic kidney disease Hemoglobin stable Daily CBC  History of NSCLC Status post right upper lobectomy and radiation Continue supplemental oxygen  GERD Continue Protonix          Malnutrition Type:      Malnutrition Characteristics:      Nutrition Interventions:       Estimated body mass index is 30.84 kg/m as calculated from the following:   Height as of 07/27/18: 5\' 10"  (1.778 m).   Weight as of this encounter: 97.5 kg.     Code Status: Full  Family Communication: None at bedside  Disposition Plan: To be determined   Consultants:  Nephrology  Procedures:  None  Antimicrobials:  None  DVT prophylaxis: Heparin   Objective: Vitals:   10/05/18 0417 10/05/18 0419 10/05/18 0756 10/05/18 1229  BP:   (!) 181/93 (!) 183/92  Pulse: 87  89 92  Resp:   18 18  Temp: (!) 97.5 F (36.4 C)  (!) 97.4 F (36.3 C) 97.8 F (36.6 C)  TempSrc: Oral  Oral Oral  SpO2: 98%  97% 90%  Weight:  97.5 kg  Intake/Output Summary (Last 24 hours) at 10/05/2018 1551 Last data filed at 10/05/2018 0620 Gross per 24 hour  Intake 240 ml  Output 250 ml  Net -10 ml   Filed Weights   10/05/18 0419  Weight: 97.5 kg    Exam:  General:   Mild distress, anasarca, chronically ill-appearing  Cardiovascular: S1, S2 present  Respiratory:  Diminished breath sounds bilaterally, bibasilar crackles  Abdomen: Soft, nontender, nondistended, bowel sounds present  Musculoskeletal: ++bilateral pedal edema noted  Skin:  Bilateral chronic venous skin changes  Psychiatry: Normal mood   Data Reviewed: CBC: Recent Labs  Lab 10/02/18 0830 10/02/18 0839 10/04/18 1320 10/05/18 1255  WBC 7.2  --  8.4 8.2  NEUTROABS 5.6  --  6.3 7.8*  HGB 11.5* 11.7* 11.4* 11.2*  HCT 36.7*  --  36.9* 34.5*  MCV 77.3*  --  77.8* 76.3*  PLT 191  --  188 956   Basic Metabolic Panel: Recent Labs  Lab 10/02/18 0830 10/04/18 1320 10/04/18 1628 10/05/18 1255  NA 142 139  --  144  K 3.2* 3.3*  --  3.5  CL 102 102  --  103  CO2 26 22  --  24  GLUCOSE 121* 119*  --  96  BUN 92* 90*  --  91*  CREATININE 4.94* 4.71*  --  5.04*  CALCIUM 8.3* 8.5*  --  8.5*  MG 2.2  --  2.0  --   PHOS 4.7*  --  4.5  --    GFR: Estimated Creatinine Clearance: 13.9 mL/min (A) (by C-G formula based on SCr of 5.04 mg/dL (H)). Liver Function Tests: Recent Labs  Lab 10/02/18 0830 10/05/18 1255  AST  --  25  ALT  --  25  ALKPHOS  --  89  BILITOT  --  0.7  PROT  --  5.6*  ALBUMIN 2.2* 2.2*   No results for input(s): LIPASE, AMYLASE in the last 168 hours. No results for input(s): AMMONIA in the last 168 hours. Coagulation Profile: Recent Labs  Lab 10/04/18 1320  INR 1.1   Cardiac Enzymes: No results for input(s): CKTOTAL, CKMB, CKMBINDEX, TROPONINI in the last 168 hours. BNP (last 3 results) No results for input(s): PROBNP in the last 8760 hours. HbA1C: No results for input(s): HGBA1C in the last 72 hours. CBG: No results for input(s): GLUCAP in the last 168 hours. Lipid Profile: Recent Labs    10/04/18 1628  CHOL 195  HDL 63  LDLCALC 115*  TRIG 84  CHOLHDL 3.1   Thyroid Function Tests: Recent Labs    10/04/18 1822  TSH 4.227   Anemia  Panel: No results for input(s): VITAMINB12, FOLATE, FERRITIN, TIBC, IRON, RETICCTPCT in the last 72 hours. Urine analysis:    Component Value Date/Time   COLORURINE YELLOW 10/04/2018 1425   APPEARANCEUR HAZY (A) 10/04/2018 1425   LABSPEC 1.015 10/04/2018 1425   PHURINE 6.0 10/04/2018 1425   GLUCOSEU 50 (A) 10/04/2018 1425   HGBUR SMALL (A) 10/04/2018 1425   BILIRUBINUR NEGATIVE 10/04/2018 1425   KETONESUR NEGATIVE 10/04/2018 1425   PROTEINUR >=300 (A) 10/04/2018 1425   NITRITE NEGATIVE 10/04/2018 1425   LEUKOCYTESUR MODERATE (A) 10/04/2018 1425   Sepsis Labs: @LABRCNTIP (procalcitonin:4,lacticidven:4)  ) Recent Results (from the past 240 hour(s))  SARS CORONAVIRUS 2 (TAT 6-24 HRS) Nasopharyngeal Nasopharyngeal Swab     Status: None   Collection Time: 10/04/18  4:01 PM   Specimen: Nasopharyngeal Swab  Result Value Ref Range Status   SARS  Coronavirus 2 NEGATIVE NEGATIVE Final    Comment: (NOTE) SARS-CoV-2 target nucleic acids are NOT DETECTED. The SARS-CoV-2 RNA is generally detectable in upper and lower respiratory specimens during the acute phase of infection. Negative results do not preclude SARS-CoV-2 infection, do not rule out co-infections with other pathogens, and should not be used as the sole basis for treatment or other patient management decisions. Negative results must be combined with clinical observations, patient history, and epidemiological information. The expected result is Negative. Fact Sheet for Patients: SugarRoll.be Fact Sheet for Healthcare Providers: https://www.woods-mathews.com/ This test is not yet approved or cleared by the [place] FDA and  has been authorized for detection and/or diagnosis of SARS-CoV-2 by FDA under an Emergency Use Authorization (EUA). This EUA will remain  in effect (meaning this test can be used) for the duration of the COVID-19 declaration under Section 56 4(b)(1) of the Act, 21  U.S.C. section 360bbb-3(b)(1), unless the authorization is terminated or revoked sooner. Performed at [facility], [place] [address] [place], [place] [address]       Studies: US Renal  Result Date: 10/04/2018 CLINICAL DATA:  Acute on chronic renal failure EXAM: RENAL / URINARY TRACT ULTRASOUND COMPLETE COMPARISON:  01/13/2018, CT 01/09/2018 FINDINGS: Right Kidney: Renal measurements: 9.9 x 4.6 x 6.6 cm = volume: 156 mL. Increased cortical echogenicity. No hydronephrosis. Multiple, fewer than 10 cysts within the right kidney. Upper pole cyst measures 1.8 by 1.6 x 1.5 cm. Midpole cyst measures 0.7 x 1 by 0.6 cm. Lower pole septated cyst measures 1.4 by 1.7 x 1.3 cm. Left Kidney: Renal measurements: 11.2 x 6.4 x 5.4 cm = volume: 203 mL. Increased cortical echogenicity without hydronephrosis. Multiple, fewer than 10 cysts within each kidney. Upper pole cyst measures 1.8 x 1.9 by 1.4 cm. Midpole cyst measures 2.4 x 1.5 by 1.9 cm. Lower pole cyst measures 1.6 by 1.3 x 1.1 cm. Bladder: Appears normal for degree of bladder distention. Enlarged prostate measuring 5.3 x 3.9 x 4.5 cm. Small amount of free fluid in the right upper quadrant. Incidental note made of bilateral pleural effusions. IMPRESSION: 1. Echogenic kidneys consistent with medical renal disease. No hydronephrosis. Bilateral renal cysts, fewer than 10 within each kidney. 2. Small amount of right upper quadrant ascites 3. Bilateral pleural effusions 4. Prostatomegaly Electronically Signed   By: Donavan Foil M.D.   On: 10/04/2018 18:01    Scheduled Meds: . amLODipine  5 mg Oral Daily  . aspirin EC  81 mg Oral Daily  . cholecalciferol  1,000 Units Oral Daily  . enoxaparin (LOVENOX) injection  30 mg Subcutaneous Q24H  . [START ON 10/06/2018] influenza vaccine adjuvanted  0.5 mL Intramuscular Tomorrow-1000  . ipratropium-albuterol  3 mL Nebulization Q6H  . pantoprazole  40 mg Oral BID    Continuous Infusions: . furosemide       LOS:  0 days     Alma Friendly, MD [facility]  If 7PM-7AM, please contact night-coverage [email or web address] 10/05/2018, 3:51 PM

## 2018-10-05 NOTE — Consult Note (Signed)
Andrew Guerra Admit Date: 10/04/2018 10/05/2018 [name] Requesting Physician:  [name] MD  Reason for Consult:  AoCKD4, Hypervolemia, Membranous Nephropathy HPI:  78 year old male seen at the request of Dr. [name] for evaluation of progressive renal failure, edema.  Patient was admitted on 9/16.  I am seeing him at the bedside with his wife.  PMH Incudes:  CKD4, recent baseline creatinine 2.7 in July of this year, has history of AKI with transient requirement for dialysis and significant proteinuria for which he underwent renal biopsy revealing PLA 2R positive membranous nephropathy.  Follows with Dr. Posey Pronto in our office.  Has not received immunotherapy.  Biopsy also revealed moderate IIF/TA and severe arteriosclerosis.  Hypertension, and amlodipine and diuretics  Chronic diastolic heart failure  Bicuspid aortic valve with moderate aortic stenosis  History of non-small cell lung cancer status post resection and XRT  Chronic oxygen therapy  Atrial fibrillation  Hyperlipidemia  As mentioned, patient follows closely with [facility].  His family has been in touch with Dr. Posey Pronto and had concerns of progressive edema on 100 mg twice daily of torsemide and metolazone 5 mg twice weekly.  He recently was started on spironolactone.  Labs thereafter showed change in renal function with a creatinine to 4.5.  Potassium was okay.  He still had edema, at that time is recommended that he presented emergency room but did not initially, presenting yesterday.  He and his wife endorse that he has felt increasingly poor.  He has a lot of weakness.  Not much appetite.  Some loss of mental clarity.  His testicles have been swelling along with his legs.  He feels bloated in the abdomen as well.  Serum albumin is 2.2  No nonsteroidals.  He is not on an ACE or an ARB.  Since presentation he has been on Lasix 40 IV twice daily.  He has a condom catheter.  Very little urine output has been  recorded.  On 7/22 his weight was 177 at our office.  It is 214 on the current reading.  Since presentation he had a renal ultrasound showing bilateral normal-sized kidneys with increased echogenicity no evidence of hydronephrosis, incidentally both ascites and pleural effusions were noted.  Similar findings were seen on the chest x-ray.   Creat (mg/dL)  Date Value  04/09/2013 1.33  12/04/2012 1.39 (H)   Creatinine, Ser (mg/dL)  Date Value  10/05/2018 5.04 (H)  10/04/2018 4.71 (H)  10/02/2018 4.94 (H)  08/21/2018 3.11 (H)  07/24/2018 2.76 (H)  06/26/2018 2.23 (H)  05/29/2018 2.49 (H)  05/15/2018 2.63 (H)  05/01/2018 2.63 (H)  05/01/2018 2.63 (H)  ] ROS NSAIDS: no exposure IV Contrast no exposure TMP/SMX no exposure Hypotension no exposure Balance of 12 systems is negative w/ exceptions as above  PMH  Past Medical History:  Diagnosis Date  . 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 02/08/2017   moderate by echo 10/2017 with mean AVG 26mmHg and dimensionless index 0.31 consistent with moderate AS.  [name] Cataract    right eye - surgery to remove  . Elevated PSA 11/29/2016   Patient is followed by [facility].  Most recent PSA near 11.  He has had atypia on biopsy.  November 2008  . Essential hypertension, benign 06/08/2012  . Full dentures   . Glaucoma   . Heart murmur    since childhood, never has caused any problems  . History of rheumatic fever 11/04/2015  . Hyperlipidemia 04/25/2013  . Hypertension   .  Malignant neoplasm of left lung ([place]) 02/08/2017  . RBBB 07/12/2017  . Sickle cell trait ([place])    no problems per patient   PSH  Past Surgical History:  Procedure Laterality Date  . BIOPSY  11/11/2017   Procedure: BIOPSY;  Surgeon: Lavena Bullion, DO;  Location: [place] ENDOSCOPY;  Service: Gastroenterology;;  . BIOPSY  01/10/2018   Procedure: BIOPSY;  Surgeon: [name], MD;  Location: [place];  Service: Gastroenterology;;  . BIOPSY  06/22/2018    Procedure: BIOPSY;  Surgeon: Yetta Flock, MD;  Location[facility]COPY;  Service: Gastroenterology;;  . cataract eye surgery Right   . COLONOSCOPY  11/2009   hx polyps/[place]  . COLONOSCOPY N/A 06/22/2018   Procedure: COLONOSCOPY;  Surgeon: Yetta Flock, MD;  Location: [place];  Service: Gastroenterology;  Laterality: N/A;  . ENTEROSCOPY N/A 06/22/2018   Procedure: PUSH ENTEROSCOPY;  Surgeon: Yetta Flock, MD;  Location: [facility] ENDOSCOPY;  Service: Gastroenterology;  Laterality: N/A;  . ESOPHAGOGASTRODUODENOSCOPY N/A 11/11/2017   Procedure: ESOPHAGOGASTRODUODENOSCOPY (EGD);  Surgeon: Lavena Bullion, DO;  Location[facility]COPY;  Service: Gastroenterology;  Laterality: N/A;  . ESOPHAGOGASTRODUODENOSCOPY (EGD) WITH PROPOFOL N/A 01/10/2018   Procedure: ESOPHAGOGASTRODUODENOSCOPY (EGD) WITH PROPOFOL;  Surgeon: [name], MD;  Location: [place];  Service: Gastroenterology;  Laterality: N/A;  . HOT HEMOSTASIS N/A 06/22/2018   Procedure: HOT HEMOSTASIS (ARGON PLASMA COAGULATION/BICAP);  Surgeon: Yetta Flock, MD;  Location: [facility] ENDOSCOPY;  Service: Gastroenterology;  Laterality: N/A;  . IR FLUORO GUIDE CV LINE RIGHT  01/16/2018  . IR FLUORO GUIDE CV LINE RIGHT  01/31/2018  . IR US GUIDE VASC ACCESS RIGHT  01/16/2018  . IR US GUIDE VASC ACCESS RIGHT  01/31/2018  . POLYPECTOMY  06/22/2018   Procedure: POLYPECTOMY;  Surgeon: Yetta Flock, MD;  Location: [place];  Service: Gastroenterology;;  . PROSTATE BIOPSY    . right eye surgery     to lower eye pressure  . RIGHT HEART CATH N/A 01/19/2018   Procedure: RIGHT HEART CATH;  Surgeon: Nelva Bush, MD;  Location: [place] CV LAB;  Service: Cardiovascular;  Laterality: N/A;  . TONSILLECTOMY    . VIDEO ASSISTED THORACOSCOPY (VATS)/ LOBECTOMY Right 12/26/2015  . wisdom tteeth ext     FH  Family History  Problem Relation Age of Onset  . Diabetes Mother   . Cerebral aneurysm Mother   . Lung  cancer Father   . Diabetes Sister   . Colon cancer Neg Hx   . Colon polyps Neg Hx   . Esophageal cancer Neg Hx   . Rectal cancer Neg Hx   . Stomach cancer Neg Hx    SH  reports that he quit smoking about 7 years ago. His smoking use included cigarettes. He smoked 1.00 pack per day. He has never used smokeless tobacco. He reports current alcohol use of about 1.0 standard drinks of alcohol per week. He reports that he does not use drugs. Allergies  Allergies  Allergen Reactions  . Ambien [Zolpidem Tartrate] Other (See Comments)    Has hallucinations and Homicidal Ideations per Wife   . Atorvastatin     Achy and tired   Home medications Prior to Admission medications   Medication Sig Start Date End Date Taking? Authorizing Provider  amLODipine (NORVASC) 2.5 MG tablet TAKE 1 TABLET BY MOUTH EVERY DAY Patient taking differently: Take 2.5 mg by mouth daily.  09/26/18  Yes Luking, Elayne Snare, MD  Brinzolamide-Brimonidine [facility]) 1-0.2 % SUSP Place 2-3  drops into both eyes 3 (three) times daily.    Yes [provider]  cholecalciferol (VITAMIN D3) 25 MCG (1000 UT) tablet Take 1,000 Units by mouth daily.   Yes [provider]  Multiple Vitamin (MULTIVITAMIN) tablet Take 1 tablet by mouth daily. With vitamin D   Yes [provider]  Netarsudil-Latanoprost (ROCKLATAN) 0.02-0.005 % SOLN Place 1 drop into both eyes at bedtime.    Yes Marylynn Pearson, MD  Nutritional Supplements (FEEDING SUPPLEMENT, NEPRO CARB STEADY,) LIQD Take 237 mLs by mouth 2 (two) times daily.   Yes [provider]  pantoprazole (PROTONIX) 40 MG tablet TAKE 1 TABLET BY MOUTH TWICE A DAY Patient taking differently: Take 40 mg by mouth 2 (two) times daily.  09/24/18  Yes [name], MD  torsemide (DEMADEX) 100 MG tablet Take 1 tablet (100 mg total) by mouth daily. Patient taking differently: Take 100 mg by mouth 2 (two) times daily.  07/12/18  Yes Luking, Elayne Snare, MD  XIIDRA 5 % SOLN Place 1  drop into both eyes 2 (two) times daily as needed (Dry eye).  03/27/18  Yes [provider]    Current Medications Scheduled Meds: . amLODipine  5 mg Oral Daily  . aspirin EC  81 mg Oral Daily  . cholecalciferol  1,000 Units Oral Daily  . enoxaparin (LOVENOX) injection  30 mg Subcutaneous Q24H  . [START ON 10/06/2018] influenza vaccine adjuvanted  0.5 mL Intramuscular Tomorrow-1000  . ipratropium-albuterol  3 mL Nebulization Q6H  . pantoprazole  40 mg Oral BID   Continuous Infusions: . furosemide     PRN Meds:.acetaminophen **OR** acetaminophen, hydrALAZINE, ondansetron **OR** ondansetron (ZOFRAN) IV  CBC Recent Labs  Lab 10/02/18 0830 10/02/18 0839 10/04/18 1320 10/05/18 1255  WBC 7.2  --  8.4 8.2  NEUTROABS 5.6  --  6.3 7.8*  HGB 11.5* 11.7* 11.4* 11.2*  HCT 36.7*  --  36.9* 34.5*  MCV 77.3*  --  77.8* 76.3*  PLT 191  --  188 151   Basic Metabolic Panel Recent Labs  Lab 10/02/18 0830 10/04/18 1320 10/04/18 1628 10/05/18 1255  NA 142 139  --  144  K 3.2* 3.3*  --  3.5  CL 102 102  --  103  CO2 26 22  --  24  GLUCOSE 121* 119*  --  96  BUN 92* 90*  --  91*  CREATININE 4.94* 4.71*  --  5.04*  CALCIUM 8.3* 8.5*  --  8.5*  PHOS 4.7*  --  4.5  --     Physical Exam  Blood pressure (!) 183/92, pulse 92, temperature 97.8 F (36.6 C), temperature source Oral, resp. rate 18, weight 97.5 kg, SpO2 90 %. GEN: Chronically ill-appearing, no distress, slow to answer questions and somewhat tangential ENT: NCAT EYES: EOMI CV: Regular, no rub, normal S1, systolic murmur present PULM: Diminished in the bases, clear elsewhere, no wheezing ABD: Soft, nontender SKIN: No rashes or lesions EXT: 2-3+ pitting edema extending into the proximal thighs.  Testicular swelling to the size of a softball.   Assessment 78 year old male with progressive renal failure, PLA2-Rpositive membranous nephropathy, anasarca.  Despite very high-dose diuretics as an outpatient, his renal  function is worse and his volume status is not improved.  Potentially he is 40 pounds heavier than when we weighed him in July.  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  I think we need to make an attempt at aggressive parenteral  diuresis and if unsuccessful potentially will need to initiate hemodialysis again.  1. Progressive renal failure, AoCKD 4;  2. PLA 2R positive membranous nephropathy; nephrotic syndrome 3. Hypoalbuminemia, anasarca, hypervolemia 4. Chronic diastolic heart failure 5. Bicuspid aortic valve with moderate aortic stenosis 6. Progressive physical decline 7. History of non-small cell lung cancer 8. Hypertension, currently uncontrolled  Plan 1. Increase Lasix to 160 IV 3 times daily, if fails to diurese consider addition of metolazone 2. If volume status does not improve, or renal function continues to worsen, will need to initiate hemodialysis 3. I do not think that immunosuppression is immediately indicated, we might of lost the opportunity for that.  If he stabilizes can consider treating with rituximab prior to discharge 4. Daily weights, Daily Renal Panel, Strict I/Os, Avoid nephrotoxins (NSAIDs, judicious IV Contrast)    [name]  [phone] 10/05/2018, 2:58 PM

## 2018-10-06 ENCOUNTER — Encounter (HOSPITAL_COMMUNITY): Payer: Self-pay | Admitting: Interventional Radiology

## 2018-10-06 ENCOUNTER — Inpatient Hospital Stay (HOSPITAL_COMMUNITY): Payer: Medicare Other

## 2018-10-06 ENCOUNTER — Encounter: Payer: Self-pay | Admitting: Family Medicine

## 2018-10-06 DIAGNOSIS — Z7951 Long term (current) use of inhaled steroids: Secondary | ICD-10-CM | POA: Diagnosis not present

## 2018-10-06 DIAGNOSIS — I480 Paroxysmal atrial fibrillation: Secondary | ICD-10-CM | POA: Diagnosis present

## 2018-10-06 DIAGNOSIS — N186 End stage renal disease: Secondary | ICD-10-CM | POA: Diagnosis present

## 2018-10-06 DIAGNOSIS — E876 Hypokalemia: Secondary | ICD-10-CM | POA: Diagnosis present

## 2018-10-06 DIAGNOSIS — K219 Gastro-esophageal reflux disease without esophagitis: Secondary | ICD-10-CM | POA: Diagnosis present

## 2018-10-06 DIAGNOSIS — Z888 Allergy status to other drugs, medicaments and biological substances status: Secondary | ICD-10-CM | POA: Diagnosis not present

## 2018-10-06 DIAGNOSIS — I272 Pulmonary hypertension, unspecified: Secondary | ICD-10-CM | POA: Diagnosis present

## 2018-10-06 DIAGNOSIS — I5033 Acute on chronic diastolic (congestive) heart failure: Secondary | ICD-10-CM | POA: Diagnosis present

## 2018-10-06 DIAGNOSIS — Z801 Family history of malignant neoplasm of trachea, bronchus and lung: Secondary | ICD-10-CM | POA: Diagnosis not present

## 2018-10-06 DIAGNOSIS — Z992 Dependence on renal dialysis: Secondary | ICD-10-CM | POA: Diagnosis not present

## 2018-10-06 DIAGNOSIS — J9621 Acute and chronic respiratory failure with hypoxia: Secondary | ICD-10-CM | POA: Diagnosis present

## 2018-10-06 DIAGNOSIS — Z79899 Other long term (current) drug therapy: Secondary | ICD-10-CM | POA: Diagnosis not present

## 2018-10-06 DIAGNOSIS — D631 Anemia in chronic kidney disease: Secondary | ICD-10-CM | POA: Diagnosis present

## 2018-10-06 DIAGNOSIS — I350 Nonrheumatic aortic (valve) stenosis: Secondary | ICD-10-CM | POA: Diagnosis present

## 2018-10-06 DIAGNOSIS — Z902 Acquired absence of lung [part of]: Secondary | ICD-10-CM | POA: Diagnosis not present

## 2018-10-06 DIAGNOSIS — Z20828 Contact with and (suspected) exposure to other viral communicable diseases: Secondary | ICD-10-CM | POA: Diagnosis present

## 2018-10-06 DIAGNOSIS — J9620 Acute and chronic respiratory failure, unspecified whether with hypoxia or hypercapnia: Secondary | ICD-10-CM | POA: Diagnosis present

## 2018-10-06 DIAGNOSIS — N042 Nephrotic syndrome with diffuse membranous glomerulonephritis: Secondary | ICD-10-CM | POA: Diagnosis present

## 2018-10-06 DIAGNOSIS — I160 Hypertensive urgency: Secondary | ICD-10-CM | POA: Diagnosis not present

## 2018-10-06 DIAGNOSIS — Z87891 Personal history of nicotine dependence: Secondary | ICD-10-CM | POA: Diagnosis not present

## 2018-10-06 DIAGNOSIS — D573 Sickle-cell trait: Secondary | ICD-10-CM | POA: Diagnosis present

## 2018-10-06 DIAGNOSIS — Z833 Family history of diabetes mellitus: Secondary | ICD-10-CM | POA: Diagnosis not present

## 2018-10-06 DIAGNOSIS — I10 Essential (primary) hypertension: Secondary | ICD-10-CM | POA: Diagnosis not present

## 2018-10-06 DIAGNOSIS — E785 Hyperlipidemia, unspecified: Secondary | ICD-10-CM | POA: Diagnosis present

## 2018-10-06 DIAGNOSIS — C3492 Malignant neoplasm of unspecified part of left bronchus or lung: Secondary | ICD-10-CM | POA: Diagnosis present

## 2018-10-06 DIAGNOSIS — N184 Chronic kidney disease, stage 4 (severe): Secondary | ICD-10-CM | POA: Diagnosis not present

## 2018-10-06 DIAGNOSIS — D509 Iron deficiency anemia, unspecified: Secondary | ICD-10-CM | POA: Diagnosis present

## 2018-10-06 DIAGNOSIS — E7849 Other hyperlipidemia: Secondary | ICD-10-CM | POA: Diagnosis not present

## 2018-10-06 DIAGNOSIS — N179 Acute kidney failure, unspecified: Secondary | ICD-10-CM | POA: Diagnosis present

## 2018-10-06 DIAGNOSIS — I132 Hypertensive heart and chronic kidney disease with heart failure and with stage 5 chronic kidney disease, or end stage renal disease: Secondary | ICD-10-CM | POA: Diagnosis present

## 2018-10-06 HISTORY — PX: IR US GUIDE VASC ACCESS RIGHT: IMG2390

## 2018-10-06 HISTORY — PX: IR FLUORO GUIDE CV LINE RIGHT: IMG2283

## 2018-10-06 LAB — CBC WITH DIFFERENTIAL/PLATELET
Abs Immature Granulocytes: 0.05 10*3/uL (ref 0.00–0.07)
Basophils Absolute: 0 10*3/uL (ref 0.0–0.1)
Basophils Relative: 0 %
Eosinophils Absolute: 0.1 10*3/uL (ref 0.0–0.5)
Eosinophils Relative: 1 %
HCT: 33.6 % — ABNORMAL LOW (ref 39.0–52.0)
Hemoglobin: 10.7 g/dL — ABNORMAL LOW (ref 13.0–17.0)
Immature Granulocytes: 1 %
Lymphocytes Relative: 8 %
Lymphs Abs: 0.6 10*3/uL — ABNORMAL LOW (ref 0.7–4.0)
MCH: 24.3 pg — ABNORMAL LOW (ref 26.0–34.0)
MCHC: 31.8 g/dL (ref 30.0–36.0)
MCV: 76.4 fL — ABNORMAL LOW (ref 80.0–100.0)
Monocytes Absolute: 0.6 10*3/uL (ref 0.1–1.0)
Monocytes Relative: 9 %
Neutro Abs: 5.8 10*3/uL (ref 1.7–7.7)
Neutrophils Relative %: 81 %
Platelets: 204 10*3/uL (ref 150–400)
RBC: 4.4 MIL/uL (ref 4.22–5.81)
RDW: 22.3 % — ABNORMAL HIGH (ref 11.5–15.5)
WBC: 7.2 10*3/uL (ref 4.0–10.5)
nRBC: 0.4 % — ABNORMAL HIGH (ref 0.0–0.2)

## 2018-10-06 LAB — RENAL FUNCTION PANEL
Albumin: 2 g/dL — ABNORMAL LOW (ref 3.5–5.0)
Anion gap: 12 (ref 5–15)
BUN: 92 mg/dL — ABNORMAL HIGH (ref 8–23)
CO2: 25 mmol/L (ref 22–32)
Calcium: 8.5 mg/dL — ABNORMAL LOW (ref 8.9–10.3)
Chloride: 106 mmol/L (ref 98–111)
Creatinine, Ser: 5.2 mg/dL — ABNORMAL HIGH (ref 0.61–1.24)
GFR calc Af Amer: 11 mL/min — ABNORMAL LOW (ref >60)
GFR calc non Af Amer: 10 mL/min — ABNORMAL LOW (ref >60)
Glucose, Bld: 98 mg/dL (ref 70–99)
Phosphorus: 5.4 mg/dL — ABNORMAL HIGH (ref 2.5–4.6)
Potassium: 3.3 mmol/L — ABNORMAL LOW (ref 3.5–5.1)
Sodium: 143 mmol/L (ref 135–145)

## 2018-10-06 LAB — CBC
HCT: 36.7 % — ABNORMAL LOW (ref 39.0–52.0)
Hemoglobin: 11.6 g/dL — ABNORMAL LOW (ref 13.0–17.0)
MCH: 24.4 pg — ABNORMAL LOW (ref 26.0–34.0)
MCHC: 31.6 g/dL (ref 30.0–36.0)
MCV: 77.1 fL — ABNORMAL LOW (ref 80.0–100.0)
Platelets: 239 10*3/uL (ref 150–400)
RBC: 4.76 MIL/uL (ref 4.22–5.81)
RDW: 22.5 % — ABNORMAL HIGH (ref 11.5–15.5)
WBC: 8.6 10*3/uL (ref 4.0–10.5)
nRBC: 0 % (ref 0.0–0.2)

## 2018-10-06 LAB — GLUCOSE, CAPILLARY: Glucose-Capillary: 94 mg/dL (ref 70–99)

## 2018-10-06 LAB — MRSA PCR SCREENING: MRSA by PCR: NEGATIVE

## 2018-10-06 MED ORDER — HYDRALAZINE HCL 25 MG PO TABS
25.0000 mg | ORAL_TABLET | Freq: Three times a day (TID) | ORAL | Status: DC
  Administered 2018-10-06 – 2018-10-13 (×20): 25 mg via ORAL
  Filled 2018-10-06 (×19): qty 1

## 2018-10-06 MED ORDER — LIDOCAINE HCL 1 % IJ SOLN
INTRAMUSCULAR | Status: AC | PRN
  Administered 2018-10-06: 5 mL

## 2018-10-06 MED ORDER — POTASSIUM CHLORIDE CRYS ER 20 MEQ PO TBCR
40.0000 meq | EXTENDED_RELEASE_TABLET | Freq: Once | ORAL | Status: AC
  Administered 2018-10-06: 40 meq via ORAL
  Filled 2018-10-06: qty 2

## 2018-10-06 MED ORDER — LIDOCAINE HCL 1 % IJ SOLN
INTRAMUSCULAR | Status: AC
  Filled 2018-10-06: qty 20

## 2018-10-06 MED ORDER — LIDOCAINE HCL (PF) 1 % IJ SOLN
5.0000 mL | INTRAMUSCULAR | Status: DC | PRN
  Filled 2018-10-06: qty 5

## 2018-10-06 MED ORDER — LABETALOL HCL 5 MG/ML IV SOLN
10.0000 mg | Freq: Once | INTRAVENOUS | Status: AC
  Administered 2018-10-06: 10 mg via INTRAVENOUS
  Filled 2018-10-06: qty 4

## 2018-10-06 MED ORDER — SODIUM CHLORIDE 0.9 % IV SOLN: 100.0000 mL | INTRAVENOUS | Status: DC | PRN

## 2018-10-06 MED ORDER — HEPARIN SODIUM (PORCINE) 1000 UNIT/ML IJ SOLN
INTRAMUSCULAR | Status: AC
  Filled 2018-10-06: qty 1

## 2018-10-06 MED ORDER — HEPARIN SODIUM (PORCINE) 1000 UNIT/ML IJ SOLN
INTRAMUSCULAR | Status: AC | PRN
  Administered 2018-10-06: 2.6 mL via INTRAVENOUS

## 2018-10-06 MED ORDER — ALTEPLASE 2 MG IJ SOLR: 2.0000 mg | Freq: Once | INTRAMUSCULAR | Status: DC | PRN

## 2018-10-06 MED ORDER — LIDOCAINE-PRILOCAINE 2.5-2.5 % EX CREA
1.0000 "application " | TOPICAL_CREAM | CUTANEOUS | Status: DC | PRN
  Filled 2018-10-06: qty 5

## 2018-10-06 MED ORDER — CHLORHEXIDINE GLUCONATE CLOTH 2 % EX PADS
6.0000 | MEDICATED_PAD | Freq: Every day | CUTANEOUS | Status: DC
  Administered 2018-10-07 – 2018-10-12 (×5): 6 via TOPICAL

## 2018-10-06 MED ORDER — PENTAFLUOROPROP-TETRAFLUOROETH EX AERO: 1.0000 "application " | INHALATION_SPRAY | CUTANEOUS | Status: DC | PRN

## 2018-10-06 MED ORDER — HEPARIN SODIUM (PORCINE) 1000 UNIT/ML DIALYSIS: 1000.0000 [IU] | INTRAMUSCULAR | Status: DC | PRN

## 2018-10-06 NOTE — Progress Notes (Signed)
[facility] Progress Note    Assessment/ Plan:   78 year old male with progressive renal failure, PLA2-Rpositive membranous nephropathy, anasarca.  Despite very high-dose diuretics as an outpatient, his renal function is worse and his volume status is not improved.  Potentially he is 40 pounds heavier than when we weighed him in July.  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  I think we need to make an attempt at aggressive parenteral diuresis and if unsuccessful potentially will need to initiate hemodialysis again.  1. Progressive renal failure, AoCKD 4-- with evidence of volume overload and failing high-dose diuretics of Laisx 160 IV TID.  Needs dialysis.  NPO, IR c/s for nontunneled HD cath, then HD after.  Pt and wife in agreement 2. PLA 2R positive membranous nephropathy; nephrotic syndrome-- hasn't been treated to date, may consider ritux before d/c  3. Acute on chronic Chronic diastolic heart failure  4. Bicuspid aortic valve with moderate aortic stenosis  5. Progressive physical decline  6. History of non-small cell lung cancer  7. Hypertension, currently uncontrolled  Subjective:    Worsening O2 requirement despite high dose diuretics.  Discussion with pt and wife at bedside.  Needs HD for volume removal.  Initially reluctant but after discussion are willing to proceed.     Objective:   BP (!) 175/86 (BP Location: Right Arm)   Pulse 92   Temp 97.9 F (36.6 C) (Oral)   Resp (!) 22   Wt 97.3 kg   SpO2 93%   BMI 30.78 kg/m   Intake/Output Summary (Last 24 hours) at 10/06/2018 1511 Last data filed at 10/06/2018 0000 Gross per 24 hour  Intake 240 ml  Output -  Net 240 ml   Weight change: -0.2 kg  Physical Exam: Gen: older gentleman, sitting upright in bed, speaking in 2-3 word sentences CVS:RRR Resp: crackles throughout Abd: some abd fullness Ext:3+ anasarca  Imaging: US Renal  Result Date:  10/04/2018 CLINICAL DATA:  Acute on chronic renal failure EXAM: RENAL / URINARY TRACT ULTRASOUND COMPLETE COMPARISON:  01/13/2018, CT 01/09/2018 FINDINGS: Right Kidney: Renal measurements: 9.9 x 4.6 x 6.6 cm = volume: 156 mL. Increased cortical echogenicity. No hydronephrosis. Multiple, fewer than 10 cysts within the right kidney. Upper pole cyst measures 1.8 by 1.6 x 1.5 cm. Midpole cyst measures 0.7 x 1 by 0.6 cm. Lower pole septated cyst measures 1.4 by 1.7 x 1.3 cm. Left Kidney: Renal measurements: 11.2 x 6.4 x 5.4 cm = volume: 203 mL. Increased cortical echogenicity without hydronephrosis. Multiple, fewer than 10 cysts within each kidney. Upper pole cyst measures 1.8 x 1.9 by 1.4 cm. Midpole cyst measures 2.4 x 1.5 by 1.9 cm. Lower pole cyst measures 1.6 by 1.3 x 1.1 cm. Bladder: Appears normal for degree of bladder distention. Enlarged prostate measuring 5.3 x 3.9 x 4.5 cm. Small amount of free fluid in the right upper quadrant. Incidental note made of bilateral pleural effusions. IMPRESSION: 1. Echogenic kidneys consistent with medical renal disease. No hydronephrosis. Bilateral renal cysts, fewer than 10 within each kidney. 2. Small amount of right upper quadrant ascites 3. Bilateral pleural effusions 4. Prostatomegaly Electronically Signed   By: Donavan Foil M.D.   On: 10/04/2018 18:01    Labs: BMET Recent Labs  Lab 10/02/18 0830 10/04/18 1320 10/04/18 1628 10/05/18 1255 10/06/18 0450  NA 142 139  --  144 143  K 3.2* 3.3*  --  3.5 3.3*  CL 102 102  --  103 106  CO2 26 22  --  24 25  GLUCOSE 121* 119*  --  96 98  BUN 92* 90*  --  91* 92*  CREATININE 4.94* 4.71*  --  5.04* 5.20*  CALCIUM 8.3* 8.5*  --  8.5* 8.5*  PHOS 4.7*  --  4.5  --  5.4*   CBC Recent Labs  Lab 10/02/18 0830 10/02/18 0839 10/04/18 1320 10/05/18 1255 10/06/18 0450  WBC 7.2  --  8.4 8.2 7.2  NEUTROABS 5.6  --  6.3 7.8* 5.8  HGB 11.5* 11.7* 11.4* 11.2* 10.7*  HCT 36.7*  --  36.9* 34.5* 33.6*  MCV 77.3*  --   77.8* 76.3* 76.4*  PLT 191  --  188 194 204    Medications:    . amLODipine  5 mg Oral Daily  . aspirin EC  81 mg Oral Daily  . [START ON 10/07/2018] Chlorhexidine Gluconate Cloth  6 each Topical Q0600  . cholecalciferol  1,000 Units Oral Daily  . heparin injection (subcutaneous)  5,000 Units Subcutaneous Q8H  . influenza vaccine adjuvanted  0.5 mL Intramuscular Tomorrow-1000  . ipratropium-albuterol  3 mL Nebulization TID  . pantoprazole  40 mg Oral BID      Madelon Lips, MD 10/06/2018, 3:11 PM

## 2018-10-06 NOTE — Progress Notes (Signed)
Awaiting IV hydralazine from pharmacy for pt's BP. Not available in either pyxis.

## 2018-10-06 NOTE — Progress Notes (Signed)
Transfer order placed per Dr. [name] for progressive care. Patient is on 7L 02 and saturating at 91%. Breathing is labored. MD unsure if he will need Bipap in near future and wants pt transferred to a PCU at this time. Rapid response and respiratory made aware. High flow [place] placed.

## 2018-10-06 NOTE — Progress Notes (Signed)
Patient transferred from [place] via bed with nursing staff. Alert/orientd x4. High flow nasal canula 7L. SOB with minimal exertion noted. LE edema noted bil. Ted hose on. Placed on progressive monitor. Vital signs taken. CHG bath done. Bed low with wheels locked. Call bell within reach. Bed alarm activated.

## 2018-10-06 NOTE — Progress Notes (Signed)
PROGRESS NOTE  Andrew Guerra RWE:[id number] DOB: Mar 24, 1940 DOA: 10/04/2018 PCP: Andrew Drown, MD  HPI/Recap of past 24 hours: HPI from Andrew Guerra is a 78 y.o. 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 He was asked by his PCP on Friday to go to the emergency department due to worsening kidney function.  Patient reports exertional shortness of breath, worsening leg swelling. In the ED, BNP is elevated more than 3000, troponin is elevated at 108-->93. Found to have worsening renal function.  TRH called for admission.    Today, patient noted to have worsening shortness of breath, requiring more oxygen.  Denies any chest pain, nausea/vomiting, abdominal pain, fever/chills.  Transfer to progressive unit    Assessment/Plan: Active Problems:   Essential hypertension, benign   Hyperlipidemia   Malignant neoplasm of left lung (HCC)   Acute on chronic renal failure (HCC)   Acute on chronic respiratory failure (HCC)   Acute on CKD stage IV Worsening Baseline creatinine around 2.7, on admission 4.9 Noted recent weight gain, progressive shortness of breath/lower extremity edema Creatinine has been progressively getting worse, has been followed by Andrew. Posey Guerra as an outpatient, with recent medication adjustments Renal ultrasound showed echogenic kidneys consistent with medical renal disease, no hydronephrosis, ascites, bilateral pleural effusions Nephrology consulted, not responding to high-dose Lasix, needs dialysis IR consulted, right temp HD cath placed on 10/06/2018, ready to use for  dialysis Continue IV Lasix for now Strict I's and O's, renally dose all meds, daily weights Daily BMP   Acute on chronic hypoxic respiratory failure/acute on chronic diastolic HF/bicuspid aortic valve with moderate aortic stenosis Worsening, requiring high flow oxygen Multifactorial, fluid overload/anasarca, history of lung cancer ABG showed hypoxia proBNP elevated Troponin slightly elevated, trended down, EKG with no acute ST changes Chest x-ray showed bilateral pleural effusion Echo showed EF of 55 to 60%, right ventricular pressure overload, basal/mid inferior wall and basal/mid inferolateral wall abnormality, pleural effusion Consider cardiology consult Continue supplemental O2, continuous pulse ox, duo nebs Continue IV Lasix Transfer to progressive unit  Hypertension Uncontrolled Increased home amlodipine to 5 mg daily, started p.o. hydralazine, IV Lasix IV hydralazine PRN Monitor closely  Anemia of chronic kidney disease Hemoglobin stable Daily CBC  History of NSCLC Status post right upper lobectomy and radiation Continue supplemental oxygen  GERD Continue Protonix          Malnutrition Type:      Malnutrition Characteristics:      Nutrition Interventions:       Estimated body mass index is 30.78 kg/m as calculated from the following:   Height as of 07/27/18: 5\' 10"  (1.778 m).   Weight as of this encounter: 97.3 kg.     Code Status: Full  Family Communication: None at bedside  Disposition Plan: To be determined   Consultants:  Nephrology  Procedures:  Temporal HD catheter placed on 10/06/2018  Antimicrobials:  None  DVT prophylaxis: Heparin   Objective: Vitals:   10/06/18 1332 10/06/18 1510 10/06/18 1523 10/06/18 1708  BP: (!) 174/94 (!) 175/86  (!) 190/97  Pulse:  92  (!) 102  Resp:  (!) 22    Temp:  97.9 F (36.6 C)    TempSrc:  Oral    SpO2:  93% 91%   Weight:        Intake/Output Summary (Last 24 hours) at  10/06/2018 1855 Last data filed at 10/06/2018 0000 Gross per 24 hour  Intake 240 ml  Output --  Net 240 ml   Filed Weights   10/05/18 0419 10/06/18 0500  Weight: 97.5 kg 97.3 kg    Exam:  General:  Mild distress, nonseptic, chronically ill-appearing  Cardiovascular: S1, S2 present  Respiratory:  Diminished breath sounds bilaterally, bibasilar crackles  Abdomen: Soft, nontender, nondistended, bowel sounds present  Musculoskeletal: ++bilateral pedal edema noted  Skin:  Bilateral chronic venous skin changes  Psychiatry: Normal mood   Data Reviewed: CBC: Recent Labs  Lab 10/02/18 0830 10/02/18 0839 10/04/18 1320 10/05/18 1255 10/06/18 0450 10/06/18 1536  WBC 7.2  --  8.4 8.2 7.2 8.6  NEUTROABS 5.6  --  6.3 7.8* 5.8  --   HGB 11.5* 11.7* 11.4* 11.2* 10.7* 11.6*  HCT 36.7*  --  36.9* 34.5* 33.6* 36.7*  MCV 77.3*  --  77.8* 76.3* 76.4* 77.1*  PLT 191  --  188 194 204 683   Basic Metabolic Panel: Recent Labs  Lab 10/02/18 0830 10/04/18 1320 10/04/18 1628 10/05/18 1255 10/06/18 0450  NA 142 139  --  144 143  K 3.2* 3.3*  --  3.5 3.3*  CL 102 102  --  103 106  CO2 26 22  --  24 25  GLUCOSE 121* 119*  --  96 98  BUN 92* 90*  --  91* 92*  CREATININE 4.94* 4.71*  --  5.04* 5.20*  CALCIUM 8.3* 8.5*  --  8.5* 8.5*  MG 2.2  --  2.0  --   --   PHOS 4.7*  --  4.5  --  5.4*   GFR: Estimated Creatinine Clearance: 13.5 mL/min (A) (by C-G formula based on SCr of 5.2 mg/dL (H)). Liver Function Tests: Recent Labs  Lab 10/02/18 0830 10/05/18 1255 10/06/18 0450  AST  --  25  --   ALT  --  25  --   ALKPHOS  --  89  --   BILITOT  --  0.7  --   PROT  --  5.6*  --   ALBUMIN 2.2* 2.2* 2.0*   No results for input(s): LIPASE, AMYLASE in the last 168 hours. No results for input(s): AMMONIA in the last 168 hours. Coagulation Profile: Recent Labs  Lab 10/04/18 1320  INR 1.1   Cardiac Enzymes: No results for input(s): CKTOTAL, CKMB, CKMBINDEX, TROPONINI in the last  168 hours. BNP (last 3 results) No results for input(s): PROBNP in the last 8760 hours. HbA1C: No results for input(s): HGBA1C in the last 72 hours. CBG: Recent Labs  Lab 10/06/18 0757  GLUCAP 94   Lipid Profile: Recent Labs    10/04/18 1628  CHOL 195  HDL 63  LDLCALC 115*  TRIG 84  CHOLHDL 3.1   Thyroid Function Tests: Recent Labs    10/04/18 1822  TSH 4.227   Anemia Panel: No results for input(s): VITAMINB12, FOLATE, FERRITIN, TIBC, IRON, RETICCTPCT in the last 72 hours. Urine analysis:    Component Value Date/Time   COLORURINE YELLOW 10/04/2018 1425   APPEARANCEUR HAZY (A) 10/04/2018 1425   LABSPEC 1.015 10/04/2018 1425   PHURINE 6.0 10/04/2018 1425   GLUCOSEU 50 (A) 10/04/2018 1425   HGBUR SMALL (A) 10/04/2018 1425   BILIRUBINUR  NEGATIVE 10/04/2018 [place] 10/04/2018 1425   PROTEINUR >=300 (A) 10/04/2018 1425   NITRITE NEGATIVE 10/04/2018 1425   LEUKOCYTESUR MODERATE (A) 10/04/2018 1425   Sepsis Labs: @LABRCNTIP (procalcitonin:4,lacticidven:4)  ) Recent Results (from the past 240 hour(s))  SARS CORONAVIRUS 2 (TAT 6-24 HRS) Nasopharyngeal Nasopharyngeal Swab     Status: None   Collection Time: 10/04/18  4:01 PM   Specimen: Nasopharyngeal Swab  Result Value Ref Range Status   SARS Coronavirus 2 NEGATIVE NEGATIVE Final    Comment: (NOTE) SARS-CoV-2 target nucleic acids are NOT DETECTED. The SARS-CoV-2 RNA is generally detectable in upper and lower respiratory specimens during the acute phase of infection. Negative results do not preclude SARS-CoV-2 infection, do not rule out co-infections with other pathogens, and should not be used as the sole basis for treatment or other patient management decisions. Negative results must be combined with clinical observations, patient history, and epidemiological information. The expected result is Negative. Fact Sheet for Patients: SugarRoll.be Fact Sheet for  Healthcare Providers: https://www.woods-mathews.com/ This test is not yet approved or cleared by the [place] FDA and  has been authorized for detection and/or diagnosis of SARS-CoV-2 by FDA under an Emergency Use Authorization (EUA). This EUA will remain  in effect (meaning this test can be used) for the duration of the COVID-19 declaration under Section 56 4(b)(1) of the Act, 21 U.S.C. section 360bbb-3(b)(1), unless the authorization is terminated or revoked sooner. Performed at [facility], [place] [address], [place], [place] [address]       Studies: Ir Cyndy Freeze Guide Cv Line Right  Result Date: 10/06/2018 INDICATION: 78 year old male with acute need for hemodialysis. He presents for placement of a temporary hemodialysis catheter. EXAM: IR ULTRASOUND GUIDANCE VASC ACCESS RIGHT; IR RIGHT FLUORO GUIDE CV LINE MEDICATIONS: None ANESTHESIA/SEDATION: None FLUOROSCOPY TIME:  Fluoroscopy Time: 0 minutes 6 seconds (1 mGy). COMPLICATIONS: None immediate. PROCEDURE: Informed written consent was obtained from the patient after a thorough discussion of the procedural risks, benefits and alternatives. All questions were addressed. Maximal Sterile Barrier Technique was utilized including caps, mask, sterile gowns, sterile gloves, sterile drape, hand hygiene and skin antiseptic. A timeout was performed prior to the initiation of the procedure. The right internal jugular vein was interrogated with ultrasound and found to be widely patent. An image was obtained and stored for the medical record. Local anesthesia was attained by infiltration with 1% lidocaine. A small dermatotomy was made. Under real-time sonographic guidance, the vessel was punctured with a 21 gauge micropuncture needle. Using standard technique, the initial micro needle was exchanged over a 0.018 micro wire for a transitional 4 [place] micro sheath. The micro sheath was then exchanged over a 0.035 wire for a fascial dilator.  The soft tissue tract was dilated. Next, a 16 cm non tunneled temporary hemodialysis catheter was advanced over the wire and positioned with the tip at the cavoatrial junction. The catheter flushes and aspirates with ease. An image was obtained and stored for the medical record. The catheter was flushed with heparinized saline and capped before being secured to the skin with 0 Prolene suture. A sterile bandage was applied. IMPRESSION: Successful placement of a right IJ approach non tunneled hemodialysis catheter. The catheter tip is at the cavoatrial junction and is ready for immediate use. Electronically Signed   By: Jacqulynn Cadet M.D.   On: 10/06/2018 17:11   Ir US Guide Vasc Access Right  Result Date: 10/06/2018 INDICATION: 78 year old male with acute need for hemodialysis.  He presents for placement of a temporary hemodialysis catheter. EXAM: IR ULTRASOUND GUIDANCE VASC ACCESS RIGHT; IR RIGHT FLUORO GUIDE CV LINE MEDICATIONS: None ANESTHESIA/SEDATION: None FLUOROSCOPY TIME:  Fluoroscopy Time: 0 minutes 6 seconds (1 mGy). COMPLICATIONS: None immediate. PROCEDURE: Informed written consent was obtained from the patient after a thorough discussion of the procedural risks, benefits and alternatives. All questions were addressed. Maximal Sterile Barrier Technique was utilized including caps, mask, sterile gowns, sterile gloves, sterile drape, hand hygiene and skin antiseptic. A timeout was performed prior to the initiation of the procedure. The right internal jugular vein was interrogated with ultrasound and found to be widely patent. An image was obtained and stored for the medical record. Local anesthesia was attained by infiltration with 1% lidocaine. A small dermatotomy was made. Under real-time sonographic guidance, the vessel was punctured with a 21 gauge micropuncture needle. Using standard technique, the initial micro needle was exchanged over a 0.018 micro wire for a transitional 4 [place] micro sheath.  The micro sheath was then exchanged over a 0.035 wire for a fascial dilator. The soft tissue tract was dilated. Next, a 16 cm non tunneled temporary hemodialysis catheter was advanced over the wire and positioned with the tip at the cavoatrial junction. The catheter flushes and aspirates with ease. An image was obtained and stored for the medical record. The catheter was flushed with heparinized saline and capped before being secured to the skin with 0 Prolene suture. A sterile bandage was applied. IMPRESSION: Successful placement of a right IJ approach non tunneled hemodialysis catheter. The catheter tip is at the cavoatrial junction and is ready for immediate use. Electronically Signed   By: Jacqulynn Cadet M.D.   On: 10/06/2018 17:11    Scheduled Meds:  amLODipine  5 mg Oral Daily   aspirin EC  81 mg Oral Daily   [START ON 10/07/2018] Chlorhexidine Gluconate Cloth  6 each Topical Q0600   cholecalciferol  1,000 Units Oral Daily   heparin       heparin injection (subcutaneous)  5,000 Units Subcutaneous Q8H   hydrALAZINE  25 mg Oral Q8H   influenza vaccine adjuvanted  0.5 mL Intramuscular Tomorrow-1000   ipratropium-albuterol  3 mL Nebulization TID   lidocaine       pantoprazole  40 mg Oral BID    Continuous Infusions:  sodium chloride     sodium chloride     furosemide 160 mg (10/06/18 1519)     LOS: 0 days     Alma Friendly, MD [facility]  If 7PM-7AM, please contact night-coverage [email or web address] 10/06/2018, 6:55 PM

## 2018-10-06 NOTE — Procedures (Signed)
Interventional Radiology Procedure Note  Procedure: Right temp HD cath placement.  16 cm, tip in cavoatrial jxn and ready for use.   Complications: None  Estimated Blood Loss: None  Recommendations: - Routine line care  Signed,  Criselda Peaches, MD

## 2018-10-06 NOTE — Progress Notes (Signed)
MD text paged regarding patient's elevated blood pressure. Hydralazine prn is not due yet. Asked regarding other interventions.

## 2018-10-06 NOTE — Progress Notes (Signed)
Patient is fully alert and oriented sitting in the bed socializing with his wife.  He denies SOB or chest pain.  His main concern is the "increased swelling" and how his kidneys are functioning.   Lung sounds clear, decreased bases Per RN increased O2 needs today.  He is currently on 7L [place] O2 sats 92-93% BP 172/100  SR 100 RR 18-22   Taught patient how to use IS.  IS about 750.  Encouraged him to use IS 10x each hour.  RN to call when assistance needed.

## 2018-10-07 LAB — BASIC METABOLIC PANEL
Anion gap: 15 (ref 5–15)
BUN: 70 mg/dL — ABNORMAL HIGH (ref 8–23)
CO2: 24 mmol/L (ref 22–32)
Calcium: 8.2 mg/dL — ABNORMAL LOW (ref 8.9–10.3)
Chloride: 102 mmol/L (ref 98–111)
Creatinine, Ser: 4.19 mg/dL — ABNORMAL HIGH (ref 0.61–1.24)
GFR calc Af Amer: 15 mL/min — ABNORMAL LOW (ref >60)
GFR calc non Af Amer: 13 mL/min — ABNORMAL LOW (ref >60)
Glucose, Bld: 90 mg/dL (ref 70–99)
Potassium: 3.5 mmol/L (ref 3.5–5.1)
Sodium: 141 mmol/L (ref 135–145)

## 2018-10-07 LAB — CBC WITH DIFFERENTIAL/PLATELET
Abs Immature Granulocytes: 0.04 10*3/uL (ref 0.00–0.07)
Basophils Absolute: 0 10*3/uL (ref 0.0–0.1)
Basophils Relative: 0 %
Eosinophils Absolute: 0.1 10*3/uL (ref 0.0–0.5)
Eosinophils Relative: 1 %
HCT: 34.3 % — ABNORMAL LOW (ref 39.0–52.0)
Hemoglobin: 11.2 g/dL — ABNORMAL LOW (ref 13.0–17.0)
Immature Granulocytes: 1 %
Lymphocytes Relative: 7 %
Lymphs Abs: 0.6 10*3/uL — ABNORMAL LOW (ref 0.7–4.0)
MCH: 24.9 pg — ABNORMAL LOW (ref 26.0–34.0)
MCHC: 32.7 g/dL (ref 30.0–36.0)
MCV: 76.4 fL — ABNORMAL LOW (ref 80.0–100.0)
Monocytes Absolute: 0.9 10*3/uL (ref 0.1–1.0)
Monocytes Relative: 10 %
Neutro Abs: 7.3 10*3/uL (ref 1.7–7.7)
Neutrophils Relative %: 81 %
Platelets: 192 10*3/uL (ref 150–400)
RBC: 4.49 MIL/uL (ref 4.22–5.81)
RDW: 22.3 % — ABNORMAL HIGH (ref 11.5–15.5)
WBC: 8.9 10*3/uL (ref 4.0–10.5)
nRBC: 0.2 % (ref 0.0–0.2)

## 2018-10-07 LAB — GLUCOSE, CAPILLARY: Glucose-Capillary: 94 mg/dL (ref 70–99)

## 2018-10-07 LAB — HEPATITIS B SURFACE ANTIGEN: Hepatitis B Surface Ag: NEGATIVE

## 2018-10-07 NOTE — Progress Notes (Signed)
PROGRESS NOTE  Andrew STRAUGHTER JQB:[id number] DOB: 1940-03-24 DOA: 10/04/2018 PCP: [name], MD  HPI/Recap of past 24 hours: HPI from Dr Doristine Bosworth [name] is a 78 y.o. 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 He was asked by his PCP on Friday to go to the emergency department due to worsening kidney function.  Patient reports exertional shortness of breath, worsening leg swelling. In the ED, BNP is elevated more than 3000, troponin is elevated at 108-->93. Found to have worsening renal function.  TRH called for admission.  10/07/2018: Patient seen alongside patient's wife.  Patient underwent hemodialysis yesterday.  Nephrology team is managing care.  Edema and shortness of breath have improved significantly.  The plan is for repeat hemodialysis today.  Nephrology team is also contemplating treating PLA2R positive membranous nephritis.  Patient will be discharged once cleared for discharge by the nephrology team.   Assessment/Plan: Active Problems:   Essential hypertension, benign   Hyperlipidemia   Malignant neoplasm of left lung (HCC)   Acute on chronic renal failure (HCC)   Acute on chronic respiratory failure (HCC)   Acute on CKD stage IV Worsening Baseline creatinine around 2.7, on admission 4.9 Noted recent weight gain, progressive shortness of breath/lower extremity edema Creatinine has been progressively getting worse, has been followed by Dr. Posey Pronto as an outpatient, with recent medication adjustments Renal ultrasound showed echogenic kidneys consistent with medical renal disease,  no hydronephrosis, ascites, bilateral pleural effusions Nephrology consulted, not responding to high-dose Lasix, needs dialysis IR consulted, right temp HD cath placed on 10/06/2018, ready to use for dialysis Continue IV Lasix for now Strict I's and O's, renally dose all meds, daily weights Daily BMP 10/07/2018: Patient underwent hemodialysis yesterday.  Kindly see above.   Acute on chronic hypoxic respiratory failure/acute on chronic diastolic HF/bicuspid aortic valve with moderate aortic stenosis Worsening, requiring high flow oxygen Multifactorial, fluid overload/anasarca, history of lung cancer ABG showed hypoxia proBNP elevated Troponin slightly elevated, trended down, EKG with no acute ST changes Chest x-ray showed bilateral pleural effusion Echo showed EF of 55 to 60%, right ventricular pressure overload, basal/mid inferior wall and basal/mid inferolateral wall abnormality, pleural effusion Consider cardiology consult Continue supplemental O2, continuous pulse ox, duo nebs Continue IV Lasix Transfer to progressive unit 10/07/2018: Respiratory symptoms have resolved.  Respiratory symptoms are likely secondary to pulmonary edema that improved with hemodialysis.  Hypertension Uncontrolled Increased home amlodipine to 5 mg daily, started p.o. hydralazine, IV Lasix IV hydralazine PRN Monitor closely 10/07/2018: Uncontrolled.  Continue to optimize.  Will likely improve with hemodialysis.  Anemia of chronic kidney disease Hemoglobin stable  History of NSCLC Status post right upper lobectomy and radiation Continue supplemental oxygen  GERD Continue Protonix   Estimated body mass index is 30.81 kg/m as calculated from the following:   Height as of this encounter: 5\' 10"  (1.778 m).   Weight as of this encounter: 97.4 kg.     Code Status: Full  Family Communication: Patient's wife  Disposition Plan: Home eventually   Consultants:  Nephrology  Procedures:  Temporal HD  catheter placed on 10/06/2018  Antimicrobials:  None  DVT prophylaxis: Heparin   Objective:  Vitals:   10/07/18 0420 10/07/18 0817 10/07/18 0824 10/07/18 1000  BP: (!) 152/88   (!) 180/89  Pulse: 88     Resp: 17     Temp: 98.2 F (36.8 C) 98 F (36.7 C)  98 F (36.7 C)  TempSrc: Oral Oral  Oral  SpO2: 96%  94%   Weight: 97.4 kg     Height: 5\' 10"  (1.778 m)       Intake/Output Summary (Last 24 hours) at 10/07/2018 1008 Last data filed at 10/07/2018 0818 Gross per 24 hour  Intake 120 ml  Output 1650 ml  Net -1530 ml   Filed Weights   10/06/18 2325 10/07/18 0225 10/07/18 0420  Weight: 96 kg 95 kg 97.4 kg    Exam:  General:  Patient is awake and alert.  Patient is not in any distress.  Cardiovascular: S1S2    Respiratory:  Clear to auscultation.  Abdomen: Soft, nontender, nondistended, bowel sounds present  Musculoskeletal: Pedal edema has improved significantly. Neuro:  Awake and alert.  Patient moves all extremities.    Data Reviewed: CBC: Recent Labs  Lab 10/02/18 0830  10/04/18 1320 10/05/18 1255 10/06/18 0450 10/06/18 1536 10/07/18 0309  WBC 7.2  --  8.4 8.2 7.2 8.6 8.9  NEUTROABS 5.6  --  6.3 7.8* 5.8  --  7.3  HGB 11.5*   < > 11.4* 11.2* 10.7* 11.6* 11.2*  HCT 36.7*  --  36.9* 34.5* 33.6* 36.7* 34.3*  MCV 77.3*  --  77.8* 76.3* 76.4* 77.1* 76.4*  PLT 191  --  188 194 204 239 192   < > = values in this interval not displayed.   Basic Metabolic Panel: Recent Labs  Lab 10/02/18 0830 10/04/18 1320 10/04/18 1628 10/05/18 1255 10/06/18 0450 10/07/18 0309  NA 142 139  --  144 143 141  K 3.2* 3.3*  --  3.5 3.3* 3.5  CL 102 102  --  103 106 102  CO2 26 22  --  24 25 24   GLUCOSE 121* 119*  --  96 98 90  BUN 92* 90*  --  91* 92* 70*  CREATININE 4.94* 4.71*  --  5.04* 5.20* 4.19*  CALCIUM 8.3* 8.5*  --  8.5* 8.5* 8.2*  MG 2.2  --  2.0  --   --   --   PHOS 4.7*  --  4.5  --  5.4*  --    GFR: Estimated Creatinine Clearance: 16.7 mL/min (A)  (by C-G formula based on SCr of 4.19 mg/dL (H)). Liver Function Tests: Recent Labs  Lab 10/02/18 0830 10/05/18 1255 10/06/18 0450  AST  --  25  --   ALT  --  25  --   ALKPHOS  --  89  --   BILITOT  --  0.7  --   PROT  --  5.6*  --   ALBUMIN 2.2* 2.2* 2.0*   No results for input(s): LIPASE, AMYLASE in the last 168 hours. No results for input(s): AMMONIA in the last 168 hours. Coagulation Profile: Recent Labs  Lab 10/04/18 1320  INR 1.1   Cardiac Enzymes: No results for input(s): CKTOTAL, CKMB, CKMBINDEX, TROPONINI in the last 168 hours. BNP (last 3 results) No results for input(s): PROBNP in the last 8760 hours. HbA1C: No results for input(s): HGBA1C in the last 72 hours. CBG: Recent Labs  Lab 10/06/18 0757 10/07/18 0753  GLUCAP 94 94   Lipid Profile: Recent Labs    10/04/18 1628  CHOL 195  HDL 63  LDLCALC 115*  TRIG 84  CHOLHDL 3.1   Thyroid Function Tests: Recent Labs    10/04/18 1822  TSH 4.227   Anemia Panel: No results for input(s): VITAMINB12, FOLATE, FERRITIN, TIBC, IRON, RETICCTPCT in the last 72 hours. Urine analysis:    Component Value Date/Time   COLORURINE YELLOW 10/04/2018 1425   APPEARANCEUR HAZY (A) 10/04/2018 1425   LABSPEC 1.015 10/04/2018 1425   PHURINE 6.0 10/04/2018 1425   GLUCOSEU 50 (A) 10/04/2018 1425   HGBUR SMALL (A) 10/04/2018 1425   BILIRUBINUR NEGATIVE 10/04/2018 1425   KETONESUR NEGATIVE 10/04/2018 1425   PROTEINUR >=300 (A) 10/04/2018 1425   NITRITE NEGATIVE 10/04/2018 1425   LEUKOCYTESUR MODERATE (A) 10/04/2018 1425   Sepsis Labs: @LABRCNTIP (procalcitonin:4,lacticidven:4)  ) Recent Results (from the past 240 hour(s))  SARS CORONAVIRUS 2 (TAT 6-24 HRS) Nasopharyngeal Nasopharyngeal Swab     Status: None   Collection Time: 10/04/18  4:01 PM   Specimen: Nasopharyngeal Swab  Result Value Ref Range Status   SARS Coronavirus 2 NEGATIVE NEGATIVE Final    Comment: (NOTE) SARS-CoV-2 target nucleic acids are NOT  DETECTED. The SARS-CoV-2 RNA is generally detectable in upper and lower respiratory specimens during the acute phase of infection. Negative results do not preclude SARS-CoV-2 infection, do not rule out co-infections with other pathogens, and should not be used as the sole basis for treatment or other patient management decisions. Negative results must be combined with clinical observations, patient history, and epidemiological information. The expected result is Negative. Fact Sheet for Patients: SugarRoll.be Fact Sheet for Healthcare Providers: https://www.woods-mathews.com/ This test is not yet approved or cleared by the [place] FDA and  has been authorized for detection and/or diagnosis of SARS-CoV-2 by FDA under an Emergency Use Authorization (EUA). This EUA will remain  in effect (meaning this test can be used) for the duration of the COVID-19 declaration under Section 56 4(b)(1) of the Act, 21 U.S.C. section 360bbb-3(b)(1), unless the authorization is terminated or revoked sooner. Performed at [facility], [place] [address], [place], [place] [address]   MRSA PCR Screening     Status: None   Collection Time: 10/06/18  8:45 PM   Specimen: Nasopharyngeal  Result Value Ref Range Status   MRSA by PCR NEGATIVE NEGATIVE Final    Comment:        The GeneXpert MRSA Assay (FDA approved for NASAL specimens only), is one component of a comprehensive MRSA colonization surveillance program. It is not intended to diagnose MRSA infection nor to guide or monitor treatment for MRSA infections. Performed at [facility], [place] [address] [place], [place] [address]       Studies: Ir Cyndy Freeze Guide Cv Line Right  Result Date: 10/06/2018 INDICATION: 78 year old male with acute need for hemodialysis. He presents for placement of a temporary hemodialysis catheter. EXAM: IR ULTRASOUND GUIDANCE VASC ACCESS RIGHT; IR RIGHT FLUORO GUIDE CV  LINE MEDICATIONS: None ANESTHESIA/SEDATION: None FLUOROSCOPY TIME:  Fluoroscopy Time: 0 minutes 6 seconds (1 mGy). COMPLICATIONS: None immediate. PROCEDURE: Informed written consent was obtained from the patient after a thorough discussion of the procedural risks, benefits and alternatives. All questions were addressed. Maximal Sterile Barrier Technique was utilized including caps, mask, sterile gowns, sterile gloves, sterile drape, hand hygiene and skin antiseptic. A timeout was performed prior to the initiation of the procedure. The right internal jugular vein was interrogated with ultrasound and found to be widely patent. An image was obtained and stored for  the medical record. Local anesthesia was attained by infiltration with 1% lidocaine. A small dermatotomy was made. Under real-time sonographic guidance, the vessel was punctured with a 21 gauge micropuncture needle. Using standard technique, the initial micro needle was exchanged over a 0.018 micro wire for a transitional 4 [place] micro sheath. The micro sheath was then exchanged over a 0.035 wire for a fascial dilator. The soft tissue tract was dilated. Next, a 16 cm non tunneled temporary hemodialysis catheter was advanced over the wire and positioned with the tip at the cavoatrial junction. The catheter flushes and aspirates with ease. An image was obtained and stored for the medical record. The catheter was flushed with heparinized saline and capped before being secured to the skin with 0 Prolene suture. A sterile bandage was applied. IMPRESSION: Successful placement of a right IJ approach non tunneled hemodialysis catheter. The catheter tip is at the cavoatrial junction and is ready for immediate use. Electronically Signed   By: Jacqulynn Cadet M.D.   On: 10/06/2018 17:11   Ir US Guide Vasc Access Right  Result Date: 10/06/2018 INDICATION: 78 year old male with acute need for hemodialysis. He presents for placement of a temporary hemodialysis  catheter. EXAM: IR ULTRASOUND GUIDANCE VASC ACCESS RIGHT; IR RIGHT FLUORO GUIDE CV LINE MEDICATIONS: None ANESTHESIA/SEDATION: None FLUOROSCOPY TIME:  Fluoroscopy Time: 0 minutes 6 seconds (1 mGy). COMPLICATIONS: None immediate. PROCEDURE: Informed written consent was obtained from the patient after a thorough discussion of the procedural risks, benefits and alternatives. All questions were addressed. Maximal Sterile Barrier Technique was utilized including caps, mask, sterile gowns, sterile gloves, sterile drape, hand hygiene and skin antiseptic. A timeout was performed prior to the initiation of the procedure. The right internal jugular vein was interrogated with ultrasound and found to be widely patent. An image was obtained and stored for the medical record. Local anesthesia was attained by infiltration with 1% lidocaine. A small dermatotomy was made. Under real-time sonographic guidance, the vessel was punctured with a 21 gauge micropuncture needle. Using standard technique, the initial micro needle was exchanged over a 0.018 micro wire for a transitional 4 [place] micro sheath. The micro sheath was then exchanged over a 0.035 wire for a fascial dilator. The soft tissue tract was dilated. Next, a 16 cm non tunneled temporary hemodialysis catheter was advanced over the wire and positioned with the tip at the cavoatrial junction. The catheter flushes and aspirates with ease. An image was obtained and stored for the medical record. The catheter was flushed with heparinized saline and capped before being secured to the skin with 0 Prolene suture. A sterile bandage was applied. IMPRESSION: Successful placement of a right IJ approach non tunneled hemodialysis catheter. The catheter tip is at the cavoatrial junction and is ready for immediate use. Electronically Signed   By: Jacqulynn Cadet M.D.   On: 10/06/2018 17:11    Scheduled Meds:  amLODipine  5 mg Oral Daily   aspirin EC  81 mg Oral Daily    Chlorhexidine Gluconate Cloth  6 each Topical Q0600   cholecalciferol  1,000 Units Oral Daily   heparin injection (subcutaneous)  5,000 Units Subcutaneous Q8H   hydrALAZINE  25 mg Oral Q8H   influenza vaccine adjuvanted  0.5 mL Intramuscular Tomorrow-1000   ipratropium-albuterol  3 mL Nebulization TID   pantoprazole  40 mg Oral BID    Continuous Infusions:  sodium chloride     sodium chloride     furosemide 160 mg (10/07/18 0634)  LOS: 1 day     Bonnell Public, MD [facility]  If 7PM-7AM, please contact night-coverage [email or web address] 10/07/2018, 10:08 AM

## 2018-10-07 NOTE — Progress Notes (Signed)
I called Dr. Hollie Salk, who said she would call dialysis and get Andrew Guerra in to dialysis today.

## 2018-10-07 NOTE — Progress Notes (Signed)
[facility] Progress Note    Assessment/ Plan:   78 year old male with progressive renal failure, PLA2-Rpositive membranous nephropathy, anasarca.  Despite very high-dose diuretics as an outpatient, his renal function is worse and his volume status is not improved.  Potentially he is 40 pounds heavier than when we weighed him in July.  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   1. Progressive renal failure, AoCKD 4-- with evidence of volume overload and failing high-dose diuretics of Laisx 160 IV TID.  Needs dialysis.  NPO, IR c/s for nontunneled HD cath, then HD after.  Pt and wife in agreement.  HD #2 today  2. PLA 2R positive membranous nephropathy; nephrotic syndrome-- hasn't been treated to date, may consider ritux before d/c  3. Acute on chronic Chronic diastolic heart failure  4. Bicuspid aortic valve with moderate aortic stenosis  5. Progressive physical decline  6. History of non-small cell lung cancer  7. Hypertension, currently uncontrolled  Subjective:    Feeling better, breathing better, still requiring O2.  Will need HD again today    Objective:   BP (!) 180/89 (BP Location: Left Arm)   Pulse 88   Temp 98 F (36.7 C) (Oral)   Resp 17   Ht 5\' 10"  (1.778 m)   Wt 97.4 kg   SpO2 94%   BMI 30.81 kg/m   Intake/Output Summary (Last 24 hours) at 10/07/2018 1146 Last data filed at 10/07/2018 0818 Gross per 24 hour  Intake 120 ml  Output 1650 ml  Net -1530 ml   Weight change: -1.3 kg  Physical Exam: Gen: older gentleman, sitting upright in bed, speaking in 2-3 word sentences CVS:RRR Resp: crackles throughout Abd: some abd fullness Ext:3+ anasarca  Imaging: Ir Fluoro Guide Cv Line Right  Result Date: 10/06/2018 INDICATION: 78 year old male with acute need for hemodialysis. He presents for placement of a temporary hemodialysis catheter. EXAM: IR ULTRASOUND GUIDANCE VASC ACCESS RIGHT; IR RIGHT FLUORO GUIDE CV  LINE MEDICATIONS: None ANESTHESIA/SEDATION: None FLUOROSCOPY TIME:  Fluoroscopy Time: 0 minutes 6 seconds (1 mGy). COMPLICATIONS: None immediate. PROCEDURE: Informed written consent was obtained from the patient after a thorough discussion of the procedural risks, benefits and alternatives. All questions were addressed. Maximal Sterile Barrier Technique was utilized including caps, mask, sterile gowns, sterile gloves, sterile drape, hand hygiene and skin antiseptic. A timeout was performed prior to the initiation of the procedure. The right internal jugular vein was interrogated with ultrasound and found to be widely patent. An image was obtained and stored for the medical record. Local anesthesia was attained by infiltration with 1% lidocaine. A small dermatotomy was made. Under real-time sonographic guidance, the vessel was punctured with a 21 gauge micropuncture needle. Using standard technique, the initial micro needle was exchanged over a 0.018 micro wire for a transitional 4 [place] micro sheath. The micro sheath was then exchanged over a 0.035 wire for a fascial dilator. The soft tissue tract was dilated. Next, a 16 cm non tunneled temporary hemodialysis catheter was advanced over the wire and positioned with the tip at the cavoatrial junction. The catheter flushes and aspirates with ease. An image was obtained and stored for the medical record. The catheter was flushed with heparinized saline and capped before being secured to the skin with 0 Prolene suture. A sterile bandage was applied. IMPRESSION: Successful placement of a right IJ approach non tunneled hemodialysis catheter. The catheter tip is at the cavoatrial junction and is ready for  immediate use. Electronically Signed   By: Jacqulynn Cadet M.D.   On: 10/06/2018 17:11   Ir US Guide Vasc Access Right  Result Date: 10/06/2018 INDICATION: 78 year old male with acute need for hemodialysis. He presents for placement of a temporary hemodialysis  catheter. EXAM: IR ULTRASOUND GUIDANCE VASC ACCESS RIGHT; IR RIGHT FLUORO GUIDE CV LINE MEDICATIONS: None ANESTHESIA/SEDATION: None FLUOROSCOPY TIME:  Fluoroscopy Time: 0 minutes 6 seconds (1 mGy). COMPLICATIONS: None immediate. PROCEDURE: Informed written consent was obtained from the patient after a thorough discussion of the procedural risks, benefits and alternatives. All questions were addressed. Maximal Sterile Barrier Technique was utilized including caps, mask, sterile gowns, sterile gloves, sterile drape, hand hygiene and skin antiseptic. A timeout was performed prior to the initiation of the procedure. The right internal jugular vein was interrogated with ultrasound and found to be widely patent. An image was obtained and stored for the medical record. Local anesthesia was attained by infiltration with 1% lidocaine. A small dermatotomy was made. Under real-time sonographic guidance, the vessel was punctured with a 21 gauge micropuncture needle. Using standard technique, the initial micro needle was exchanged over a 0.018 micro wire for a transitional 4 [place] micro sheath. The micro sheath was then exchanged over a 0.035 wire for a fascial dilator. The soft tissue tract was dilated. Next, a 16 cm non tunneled temporary hemodialysis catheter was advanced over the wire and positioned with the tip at the cavoatrial junction. The catheter flushes and aspirates with ease. An image was obtained and stored for the medical record. The catheter was flushed with heparinized saline and capped before being secured to the skin with 0 Prolene suture. A sterile bandage was applied. IMPRESSION: Successful placement of a right IJ approach non tunneled hemodialysis catheter. The catheter tip is at the cavoatrial junction and is ready for immediate use. Electronically Signed   By: Jacqulynn Cadet M.D.   On: 10/06/2018 17:11    Labs: BMET Recent Labs  Lab 10/02/18 0830 10/04/18 1320 10/04/18 1628 10/05/18 1255  10/06/18 0450 10/07/18 0309  NA 142 139  --  144 143 141  K 3.2* 3.3*  --  3.5 3.3* 3.5  CL 102 102  --  103 106 102  CO2 26 22  --  24 25 24   GLUCOSE 121* 119*  --  96 98 90  BUN 92* 90*  --  91* 92* 70*  CREATININE 4.94* 4.71*  --  5.04* 5.20* 4.19*  CALCIUM 8.3* 8.5*  --  8.5* 8.5* 8.2*  PHOS 4.7*  --  4.5  --  5.4*  --    CBC Recent Labs  Lab 10/04/18 1320 10/05/18 1255 10/06/18 0450 10/06/18 1536 10/07/18 0309  WBC 8.4 8.2 7.2 8.6 8.9  NEUTROABS 6.3 7.8* 5.8  --  7.3  HGB 11.4* 11.2* 10.7* 11.6* 11.2*  HCT 36.9* 34.5* 33.6* 36.7* 34.3*  MCV 77.8* 76.3* 76.4* 77.1* 76.4*  PLT 188 194 204 239 192    Medications:    . amLODipine  5 mg Oral Daily  . aspirin EC  81 mg Oral Daily  . Chlorhexidine Gluconate Cloth  6 each Topical Q0600  . cholecalciferol  1,000 Units Oral Daily  . heparin injection (subcutaneous)  5,000 Units Subcutaneous Q8H  . hydrALAZINE  25 mg Oral Q8H  . influenza vaccine adjuvanted  0.5 mL Intramuscular Tomorrow-1000  . ipratropium-albuterol  3 mL Nebulization TID  . pantoprazole  40 mg Oral BID      Madelon Lips, MD  10/07/2018, 11:46 AM

## 2018-10-07 NOTE — Progress Notes (Addendum)
Nephrologist told dialysis nurse that Andrew Guerra will not have dialysis today because they are prioritizing pt care.   Dr. Marthenia Rolling was notified. Pt was unable to tolerate laying down so I could clean him.  Abdomen swollen tight.

## 2018-10-07 NOTE — Progress Notes (Signed)
Pharmacist Heart Failure Core Measure Documentation  Assessment: BENNET KUJAWA has an EF documented as 61 to 60% on 9/17 by Dr. Doristine Bosworth.  Rationale: Heart failure patients with left ventricular systolic dysfunction (LVSD) and an EF < 40% should be prescribed an angiotensin converting enzyme inhibitor (ACEI) or angiotensin receptor blocker (ARB) at discharge unless a contraindication is documented in the medical record.  This patient is not currently on an ACEI or ARB for HF.  This note is being placed in the record in order to provide documentation that a contraindication to the use of these agents is present for this encounter.  ACE Inhibitor or Angiotensin Receptor Blocker is contraindicated (specify all that apply)  []   ACEI allergy AND ARB allergy []   Angioedema []   Moderate or severe aortic stenosis []   Hyperkalemia []   Hypotension []   Renal artery stenosis [x]   Worsening renal function, preexisting renal disease or dysfunction  Agnes Lawrence, PharmD PGY1 Pharmacy Resident

## 2018-10-07 NOTE — Progress Notes (Signed)
Text paged md: pt requests PT and asking if md wants second covid swab collected.

## 2018-10-08 DIAGNOSIS — I5033 Acute on chronic diastolic (congestive) heart failure: Secondary | ICD-10-CM

## 2018-10-08 DIAGNOSIS — I350 Nonrheumatic aortic (valve) stenosis: Secondary | ICD-10-CM

## 2018-10-08 DIAGNOSIS — I160 Hypertensive urgency: Secondary | ICD-10-CM

## 2018-10-08 LAB — GLUCOSE, CAPILLARY: Glucose-Capillary: 89 mg/dL (ref 70–99)

## 2018-10-08 MED ORDER — CARVEDILOL 3.125 MG PO TABS
3.1250 mg | ORAL_TABLET | Freq: Two times a day (BID) | ORAL | Status: DC
  Administered 2018-10-08 – 2018-10-13 (×9): 3.125 mg via ORAL
  Filled 2018-10-08 (×8): qty 1

## 2018-10-08 NOTE — Progress Notes (Signed)
[facility] Progress Note    Assessment/ Plan:   78 year old male with progressive renal failure, PLA2-Rpositive membranous nephropathy, anasarca.  Despite very high-dose diuretics as an outpatient, his renal function is worse and his volume status is not improved.  Potentially he is 40 pounds heavier than when we weighed him in July.  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   1. Progressive renal failure, AoCKD 4-- with evidence of volume overload and failing high-dose diuretics of Laisx 160 IV TID.  Needs dialysis.  HD #3 tomorrow 9/21. Continue Lasix for now between HD sessions  2. PLA 2R positive membranous nephropathy; nephrotic syndrome-- hasn't been treated to date, may consider ritux before d/c  3. Acute on chronic Chronic diastolic heart failure  4. Bicuspid aortic valve with moderate aortic stenosis  5. Progressive physical decline  6. History of non-small cell lung cancer  7. Hypertension, currently uncontrolled  Subjective:    HD yesterday, breathing better but still a long way to go.  Will do HD again tomororw.   Objective:   BP (!) 166/86 (BP Location: Left Wrist)   Pulse 86   Temp (!) 97.4 F (36.3 C) (Oral)   Resp 19   Ht 5\' 10"  (1.778 m)   Wt 93.8 kg   SpO2 96%   BMI 29.67 kg/m   Intake/Output Summary (Last 24 hours) at 10/08/2018 1355 Last data filed at 10/08/2018 0900 Gross per 24 hour  Intake 480 ml  Output 1540 ml  Net -1060 ml   Weight change: -1.9 kg  Physical Exam: Gen: older gentleman, sitting upright in bed, speaking in 2-3 word sentences CVS:RRR Resp: crackles throughout Abd: some abd fullness Ext:3+ anasarca  Imaging: Ir Fluoro Guide Cv Line Right  Result Date: 10/06/2018 INDICATION: 78 year old male with acute need for hemodialysis. He presents for placement of a temporary hemodialysis catheter. EXAM: IR ULTRASOUND GUIDANCE VASC ACCESS RIGHT; IR RIGHT FLUORO GUIDE CV LINE  MEDICATIONS: None ANESTHESIA/SEDATION: None FLUOROSCOPY TIME:  Fluoroscopy Time: 0 minutes 6 seconds (1 mGy). COMPLICATIONS: None immediate. PROCEDURE: Informed written consent was obtained from the patient after a thorough discussion of the procedural risks, benefits and alternatives. All questions were addressed. Maximal Sterile Barrier Technique was utilized including caps, mask, sterile gowns, sterile gloves, sterile drape, hand hygiene and skin antiseptic. A timeout was performed prior to the initiation of the procedure. The right internal jugular vein was interrogated with ultrasound and found to be widely patent. An image was obtained and stored for the medical record. Local anesthesia was attained by infiltration with 1% lidocaine. A small dermatotomy was made. Under real-time sonographic guidance, the vessel was punctured with a 21 gauge micropuncture needle. Using standard technique, the initial micro needle was exchanged over a 0.018 micro wire for a transitional 4 [place] micro sheath. The micro sheath was then exchanged over a 0.035 wire for a fascial dilator. The soft tissue tract was dilated. Next, a 16 cm non tunneled temporary hemodialysis catheter was advanced over the wire and positioned with the tip at the cavoatrial junction. The catheter flushes and aspirates with ease. An image was obtained and stored for the medical record. The catheter was flushed with heparinized saline and capped before being secured to the skin with 0 Prolene suture. A sterile bandage was applied. IMPRESSION: Successful placement of a right IJ approach non tunneled hemodialysis catheter. The catheter tip is at the cavoatrial junction and is ready for immediate use. Electronically Signed  By: Jacqulynn Cadet M.D.   On: 10/06/2018 17:11   Ir US Guide Vasc Access Right  Result Date: 10/06/2018 INDICATION: 78 year old male with acute need for hemodialysis. He presents for placement of a temporary hemodialysis catheter.  EXAM: IR ULTRASOUND GUIDANCE VASC ACCESS RIGHT; IR RIGHT FLUORO GUIDE CV LINE MEDICATIONS: None ANESTHESIA/SEDATION: None FLUOROSCOPY TIME:  Fluoroscopy Time: 0 minutes 6 seconds (1 mGy). COMPLICATIONS: None immediate. PROCEDURE: Informed written consent was obtained from the patient after a thorough discussion of the procedural risks, benefits and alternatives. All questions were addressed. Maximal Sterile Barrier Technique was utilized including caps, mask, sterile gowns, sterile gloves, sterile drape, hand hygiene and skin antiseptic. A timeout was performed prior to the initiation of the procedure. The right internal jugular vein was interrogated with ultrasound and found to be widely patent. An image was obtained and stored for the medical record. Local anesthesia was attained by infiltration with 1% lidocaine. A small dermatotomy was made. Under real-time sonographic guidance, the vessel was punctured with a 21 gauge micropuncture needle. Using standard technique, the initial micro needle was exchanged over a 0.018 micro wire for a transitional 4 [place] micro sheath. The micro sheath was then exchanged over a 0.035 wire for a fascial dilator. The soft tissue tract was dilated. Next, a 16 cm non tunneled temporary hemodialysis catheter was advanced over the wire and positioned with the tip at the cavoatrial junction. The catheter flushes and aspirates with ease. An image was obtained and stored for the medical record. The catheter was flushed with heparinized saline and capped before being secured to the skin with 0 Prolene suture. A sterile bandage was applied. IMPRESSION: Successful placement of a right IJ approach non tunneled hemodialysis catheter. The catheter tip is at the cavoatrial junction and is ready for immediate use. Electronically Signed   By: Jacqulynn Cadet M.D.   On: 10/06/2018 17:11    Labs: [facility] Recent Labs  Lab 10/02/18 0830 10/04/18 1320 10/04/18 1628 10/05/18 1255  10/06/18 0450 10/07/18 0309  NA 142 139  --  144 143 141  K 3.2* 3.3*  --  3.5 3.3* 3.5  CL 102 102  --  103 106 102  CO2 26 22  --  24 25 24   GLUCOSE 121* 119*  --  96 98 90  BUN 92* 90*  --  91* 92* 70*  CREATININE 4.94* 4.71*  --  5.04* 5.20* 4.19*  CALCIUM 8.3* 8.5*  --  8.5* 8.5* 8.2*  PHOS 4.7*  --  4.5  --  5.4*  --    CBC Recent Labs  Lab 10/04/18 1320 10/05/18 1255 10/06/18 0450 10/06/18 1536 10/07/18 0309  WBC 8.4 8.2 7.2 8.6 8.9  NEUTROABS 6.3 7.8* 5.8  --  7.3  HGB 11.4* 11.2* 10.7* 11.6* 11.2*  HCT 36.9* 34.5* 33.6* 36.7* 34.3*  MCV 77.8* 76.3* 76.4* 77.1* 76.4*  PLT 188 194 204 239 192    Medications:    . amLODipine  5 mg Oral Daily  . aspirin EC  81 mg Oral Daily  . Chlorhexidine Gluconate Cloth  6 each Topical Q0600  . cholecalciferol  1,000 Units Oral Daily  . heparin injection (subcutaneous)  5,000 Units Subcutaneous Q8H  . hydrALAZINE  25 mg Oral Q8H  . influenza vaccine adjuvanted  0.5 mL Intramuscular Tomorrow-1000  . ipratropium-albuterol  3 mL Nebulization TID  . pantoprazole  40 mg Oral BID      Madelon Lips, MD 10/08/2018, 1:55 PM

## 2018-10-08 NOTE — Evaluation (Signed)
Physical Therapy Evaluation Patient Details Name: Andrew Guerra MRN: [id number] DOB: [birth year] Today's Date: 10/08/2018   History of Present Illness  78 y.o. 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    Clinical Impression  Pt admitted with above diagnosis. PTA pt I/mod I mobility and ADLs, living at home with his wife. He utilizes 2L O2 at baseline. On eval, pt required min guard assist bed mobility, transfers and ambulation 75 feet with RW. Pt on 8L O2 during session, 93% at rest and 89-91% during mobility. Pt is very motivated to regain his independence.  Pt currently with functional limitations due to the deficits listed below (see PT Problem List). Pt will benefit from skilled PT to increase their independence and safety with mobility to allow discharge to the venue listed below.       Follow Up Recommendations Home health PT;Supervision for mobility/OOB    Equipment Recommendations  None recommended by PT    Recommendations for Other Services       Precautions / Restrictions Precautions Precautions: Fall;Other (comment) Precaution Comments: watch sats      Mobility  Bed Mobility Overal bed mobility: Needs Assistance Bed Mobility: Supine to Sit     Supine to sit: Min guard;HOB elevated     General bed mobility comments: +rail, increased time and effort  Transfers Overall transfer level: Needs assistance Equipment used: Rolling walker (2 wheeled) Transfers: Sit to/from Stand Sit to Stand: Min guard         General transfer comment: increased time and effort, min guard for  safety/lines  Ambulation/Gait Ambulation/Gait assistance: Min guard Gait Distance (Feet): 75 Feet Assistive device: Rolling walker (2 wheeled) Gait Pattern/deviations: Step-through pattern;Decreased stride length Gait velocity: decreased   General Gait Details: Pt ambulated on 8L O2 with desat to 87%. Pt 93% on 8L at rest. Min guard assist for safety and line management.  Stairs            Wheelchair Mobility    Modified Rankin (Stroke Patients Only)       Balance Overall balance assessment: Needs assistance Sitting-balance support: No upper extremity supported;Feet supported Sitting balance-Leahy Scale: Good     Standing balance support: During functional activity Standing balance-Leahy Scale: Poor Standing balance comment: reliant on BUE support                             Pertinent Vitals/Pain Pain Assessment: No/denies pain    Home Living Family/patient expects to be discharged to:: Private residence Living Arrangements: Spouse/significant other Available Help at Discharge: Family;Available 24 hours/day Type of Home: House Home Access: Level entry     Home Layout: Two level;Able to live on main level with bedroom/bathroom Home Equipment: Walker - 4 wheels;Other (comment) Additional Comments: home O2 (2L)    Prior Function Level of Independence: Independent with assistive device(s)         Comments: Ambulates with/without rollator     Hand Dominance   Dominant Hand: Left    Extremity/Trunk Assessment   Upper Extremity Assessment Upper Extremity Assessment: Overall WFL for tasks assessed    Lower Extremity Assessment Lower Extremity Assessment: Overall WFL for  tasks assessed    Cervical / Trunk Assessment Cervical / Trunk Assessment: Kyphotic  Communication   Communication: No difficulties  Cognition Arousal/Alertness: Awake/alert Behavior During Therapy: WFL for tasks assessed/performed Overall Cognitive Status: Within  Functional Limits for tasks assessed                                        General Comments General comments (skin integrity, edema, etc.): SpO2 93% on 8L at rest prior to mobility. Desat to 89% on 8L during ambulation. Quick return to 93% upon sitting in recliner.    Exercises     Assessment/Plan    PT Assessment Patient needs continued PT services  PT Problem List Decreased mobility;Cardiopulmonary status limiting activity;Decreased activity tolerance;Decreased balance       PT Treatment Interventions Therapeutic activities;Gait training;Therapeutic exercise;Patient/family education;Balance training;Functional mobility training    PT Goals (Current goals can be found in the Care Plan section)  Acute Rehab PT Goals Patient Stated Goal: home PT Goal Formulation: With patient Time For Goal Achievement: 10/22/18 Potential to Achieve Goals: Good    Frequency Min 3X/week   Barriers to discharge        Co-evaluation               AM-PAC PT "6 Clicks" Mobility  Outcome Measure Help needed turning from your back to your side while in a flat bed without using bedrails?: None Help needed moving from lying on your back to sitting on the side of a flat bed without using bedrails?: A Little Help needed moving to and from a bed to a chair (including a wheelchair)?: A Little Help needed standing up from a chair using your arms (e.g., wheelchair or bedside chair)?: A Little Help needed to walk in hospital room?: A Little Help needed climbing 3-5 steps with a railing? : A Lot 6 Click Score: 18    End of Session Equipment Utilized During Treatment: Oxygen;Gait belt Activity Tolerance: Patient tolerated treatment well Patient left: in chair;with call bell/phone within reach;with nursing/sitter in room Nurse Communication: Mobility status PT Visit Diagnosis: Other abnormalities of gait and mobility (R26.89)    Time: 0930-1005 PT Time Calculation (min) (ACUTE  ONLY): 35 min   Charges:   PT Evaluation $PT Eval Moderate Complexity: 1 Mod PT Treatments $Gait Training: 8-22 mins        Lorrin Goodell, PT  Office # [phone] Pager [phone]   Lorriane Shire 10/08/2018, 11:00 AM

## 2018-10-08 NOTE — Progress Notes (Signed)
PROGRESS NOTE  Andrew Guerra FVC:[id number] DOB: [birth year] DOA: 10/04/2018 PCP: [name], MD  HPI/Recap of past 24 hours: HPI from Dr Doristine Bosworth Andrew Guerra is a 78 y.o. 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 He was asked by his PCP on Friday to go to the emergency department due to worsening kidney function.  Patient reports exertional shortness of breath, worsening leg swelling. In the ED, BNP is elevated more than 3000, troponin is elevated at 108-->93. Found to have worsening renal function.  TRH called for admission.  10/07/2018: Patient seen alongside patient's wife.  Patient underwent hemodialysis yesterday.  Nephrology team is managing care.  Edema and shortness of breath have improved significantly.  The plan is for repeat hemodialysis today.  Nephrology team is also contemplating treating PLA2R positive membranous glomerulonephritis.  Patient will be discharged once cleared for discharge by the nephrology team.  10/08/2018: Patient underwent second hemodialysis last evening.  Edema persists.  History of valvular heart disease noted.  Input from nephrology team is appreciated.  Kindly see prior documentation.  Patient will likely benefit from cardiology follow-up on discharge for valvular heart disease (moderate aortic stenosis/regurgitation), depending on goal of care.  Assessment/Plan: Active Problems:   Essential hypertension, benign   Hyperlipidemia   Malignant neoplasm of left lung (HCC)   Acute on chronic renal failure (HCC)   Acute on chronic respiratory failure (HCC)   Acute  on CKD stage IV Worsening Baseline creatinine around 2.7, on admission 4.9 Noted recent weight gain, progressive shortness of breath/lower extremity edema Creatinine has been progressively getting worse, has been followed by Dr. Posey Pronto as an outpatient, with recent medication adjustments Renal ultrasound showed echogenic kidneys consistent with medical renal disease, no hydronephrosis, ascites, bilateral pleural effusions Nephrology consulted, not responding to high-dose Lasix, needs dialysis IR consulted, right temp HD cath placed on 10/06/2018, ready to use for dialysis Continue IV Lasix for now Strict I's and O's, renally dose all meds, daily weights Daily BMP 10/08/2018: Patient underwent second hemodialysis yesterday.  Hemodialysis is planned for tomorrow.  Nephrology is managing.   Acute on chronic hypoxic respiratory failure/acute on chronic diastolic HF/moderate aortic stenosis: Worsening, requiring high flow oxygen Multifactorial, fluid overload/anasarca, history of lung cancer ABG showed hypoxia proBNP elevated Troponin slightly elevated, trended down, EKG with no acute ST changes Chest x-ray showed bilateral pleural effusion Echo showed EF of 55 to 60%, right ventricular pressure overload, basal/mid inferior wall and basal/mid inferolateral wall abnormality, pleural effusion Consider cardiology consult Continue supplemental O2, continuous pulse ox, duo nebs Continue IV Lasix Transfer to progressive unit 10/07/2018: Respiratory symptoms have resolved.  Respiratory symptoms are likely secondary to pulmonary edema that improved with hemodialysis. 10/08/2018: Symptoms improving with hemodialysis.  Patient may benefit from cardiology follow-up on discharge for valvular heart disease (aortic valve).  Will add Coreg.  Hypertension Uncontrolled Increased home amlodipine to 5 mg daily, started p.o. hydralazine, IV Lasix IV hydralazine PRN Monitor closely 10/07/2018: Uncontrolled.   Continue to optimize.  Will likely improve with hemodialysis. 10/08/2018: Slowly improving with hemodialysis.  Will add Coreg.  There is room to increase Norvasc to 10 mg p.o. once daily.  Anemia  of chronic kidney disease Hemoglobin stable  History of NSCLC Status post right upper lobectomy and radiation Continue supplemental oxygen  GERD Continue Protonix   Estimated body mass index is 29.67 kg/m as calculated from the following:   Height as of this encounter: 5\' 10"  (1.778 m).   Weight as of this encounter: 93.8 kg.     Code Status: Full  Family Communication: Patient's wife  Disposition Plan: Home eventually   Consultants:  Nephrology  Procedures:  Temporal HD catheter placed on 10/06/2018  Antimicrobials:  None  DVT prophylaxis: Heparin   Objective: Vitals:   10/08/18 0558 10/08/18 0819 10/08/18 0846 10/08/18 1405  BP:  (!) 166/86  (!) 171/90  Pulse:  86  87  Resp:  19  20  Temp:  (!) 97.4 F (36.3 C)  (!) 97.5 F (36.4 C)  TempSrc:  Oral  Oral  SpO2:   96% 95%  Weight: 93.8 kg     Height:        Intake/Output Summary (Last 24 hours) at 10/08/2018 1446 Last data filed at 10/08/2018 1200 Gross per 24 hour  Intake 720 ml  Output 1540 ml  Net -820 ml   Filed Weights   10/07/18 1750 10/07/18 1959 10/08/18 0558  Weight: 94.1 kg 93.2 kg 93.8 kg    Exam:  General:  Patient is awake and alert.  Patient is not in any distress.  Cardiovascular: S1S2    Respiratory:  Clear to auscultation.  Abdomen: Soft, nontender, nondistended, bowel sounds present  Musculoskeletal: Pedal edema has improved significantly. Neuro:  Awake and alert.  Patient moves all extremities.    Data Reviewed: CBC: Recent Labs  Lab 10/02/18 0830  10/04/18 1320 10/05/18 1255 10/06/18 0450 10/06/18 1536 10/07/18 0309  WBC 7.2  --  8.4 8.2 7.2 8.6 8.9  NEUTROABS 5.6  --  6.3 7.8* 5.8  --  7.3  HGB 11.5*   < > 11.4* 11.2* 10.7* 11.6* 11.2*  HCT 36.7*  --  36.9* 34.5*  33.6* 36.7* 34.3*  MCV 77.3*  --  77.8* 76.3* 76.4* 77.1* 76.4*  PLT 191  --  188 194 204 239 192   < > = values in this interval not displayed.   Basic Metabolic Panel: Recent Labs  Lab 10/02/18 0830 10/04/18 1320 10/04/18 1628 10/05/18 1255 10/06/18 0450 10/07/18 0309  NA 142 139  --  144 143 141  K 3.2* 3.3*  --  3.5 3.3* 3.5  CL 102 102  --  103 106 102  CO2 26 22  --  24 25 24   GLUCOSE 121* 119*  --  96 98 90  BUN 92* 90*  --  91* 92* 70*  CREATININE 4.94* 4.71*  --  5.04* 5.20* 4.19*  CALCIUM 8.3* 8.5*  --  8.5* 8.5* 8.2*  MG 2.2  --  2.0  --   --   --   PHOS 4.7*  --  4.5  --  5.4*  --    GFR: Estimated Creatinine Clearance: 16.4 mL/min (A) (by C-G formula based on SCr of 4.19 mg/dL (H)). Liver Function Tests: Recent Labs  Lab 10/02/18 0830 10/05/18 1255 10/06/18 0450  AST  --  25  --   ALT  --  25  --   ALKPHOS  --  89  --   BILITOT  --  0.7  --   PROT  --  5.6*  --   ALBUMIN 2.2* 2.2* 2.0*   No results for  input(s): LIPASE, AMYLASE in the last 168 hours. No results for input(s): AMMONIA in the last 168 hours. Coagulation Profile: Recent Labs  Lab 10/04/18 1320  INR 1.1   Cardiac Enzymes: No results for input(s): CKTOTAL, CKMB, CKMBINDEX, TROPONINI in the last 168 hours. BNP (last 3 results) No results for input(s): PROBNP in the last 8760 hours. HbA1C: No results for input(s): HGBA1C in the last 72 hours. CBG: Recent Labs  Lab 10/06/18 0757 10/07/18 0753 10/08/18 0756  GLUCAP 94 94 89   Lipid Profile: No results for input(s): CHOL, HDL, LDLCALC, TRIG, CHOLHDL, LDLDIRECT in the last 72 hours. Thyroid Function Tests: No results for input(s): TSH, T4TOTAL, FREET4, T3FREE, THYROIDAB in the last 72 hours. Anemia Panel: No results for input(s): VITAMINB12, FOLATE, FERRITIN, TIBC, IRON, RETICCTPCT in the last 72 hours. Urine analysis:    Component Value Date/Time   COLORURINE YELLOW 10/04/2018 1425   APPEARANCEUR HAZY (A) 10/04/2018 1425    LABSPEC 1.015 10/04/2018 1425   PHURINE 6.0 10/04/2018 1425   GLUCOSEU 50 (A) 10/04/2018 1425   HGBUR SMALL (A) 10/04/2018 1425   BILIRUBINUR NEGATIVE 10/04/2018 1425   KETONESUR NEGATIVE 10/04/2018 1425   PROTEINUR >=300 (A) 10/04/2018 1425   NITRITE NEGATIVE 10/04/2018 1425   LEUKOCYTESUR MODERATE (A) 10/04/2018 1425   Sepsis Labs: @LABRCNTIP (procalcitonin:4,lacticidven:4)  ) Recent Results (from the past 240 hour(s))  SARS CORONAVIRUS 2 (TAT 6-24 HRS) Nasopharyngeal Nasopharyngeal Swab     Status: None   Collection Time: 10/04/18  4:01 PM   Specimen: Nasopharyngeal Swab  Result Value Ref Range Status   SARS Coronavirus 2 NEGATIVE NEGATIVE Final    Comment: (NOTE) SARS-CoV-2 target nucleic acids are NOT DETECTED. The SARS-CoV-2 RNA is generally detectable in upper and lower respiratory specimens during the acute phase of infection. Negative results do not preclude SARS-CoV-2 infection, do not rule out co-infections with other pathogens, and should not be used as the sole basis for treatment or other patient management decisions. Negative results must be combined with clinical observations, patient history, and epidemiological information. The expected result is Negative. Fact Sheet for Patients: SugarRoll.be Fact Sheet for Healthcare Providers: https://www.woods-mathews.com/ This test is not yet approved or cleared by the [place] FDA and  has been authorized for detection and/or diagnosis of SARS-CoV-2 by FDA under an Emergency Use Authorization (EUA). This EUA will remain  in effect (meaning this test can be used) for the duration of the COVID-19 declaration under Section 56 4(b)(1) of the Act, 21 U.S.C. section 360bbb-3(b)(1), unless the authorization is terminated or revoked sooner. Performed at [facility], [place] [address], [place], [place] [address]   MRSA PCR Screening     Status: None   Collection Time:  10/06/18  8:45 PM   Specimen: Nasopharyngeal  Result Value Ref Range Status   MRSA by PCR NEGATIVE NEGATIVE Final    Comment:        The GeneXpert MRSA Assay (FDA approved for NASAL specimens only), is one component of a comprehensive MRSA colonization surveillance program. It is not intended to diagnose MRSA infection nor to guide or monitor treatment for MRSA infections. Performed at [facility], [place] [address], [place], [place] [address]       Studies: No results found.  Scheduled Meds: . amLODipine  5 mg Oral Daily  . aspirin EC  81 mg Oral Daily  . Chlorhexidine Gluconate Cloth  6 each Topical Q0600  . cholecalciferol  1,000 Units Oral Daily  . heparin  injection (subcutaneous)  5,000 Units Subcutaneous Q8H  . hydrALAZINE  25 mg Oral Q8H  . influenza vaccine adjuvanted  0.5 mL Intramuscular Tomorrow-1000  . ipratropium-albuterol  3 mL Nebulization TID  . pantoprazole  40 mg Oral BID    Continuous Infusions: . sodium chloride    . sodium chloride    . furosemide 160 mg (10/08/18 1408)     LOS: 2 days     Bonnell Public, MD [facility]  If 7PM-7AM, please contact night-coverage [email or web address] 10/08/2018, 2:46 PM

## 2018-10-09 ENCOUNTER — Encounter (HOSPITAL_COMMUNITY): Payer: Medicare Other

## 2018-10-09 LAB — CBC WITH DIFFERENTIAL/PLATELET
Abs Immature Granulocytes: 0.2 10*3/uL — ABNORMAL HIGH (ref 0.00–0.07)
Basophils Absolute: 0.1 10*3/uL (ref 0.0–0.1)
Basophils Relative: 1 %
Eosinophils Absolute: 0.2 10*3/uL (ref 0.0–0.5)
Eosinophils Relative: 3 %
HCT: 34.6 % — ABNORMAL LOW (ref 39.0–52.0)
Hemoglobin: 11 g/dL — ABNORMAL LOW (ref 13.0–17.0)
Lymphocytes Relative: 7 %
Lymphs Abs: 0.6 10*3/uL — ABNORMAL LOW (ref 0.7–4.0)
MCH: 24.8 pg — ABNORMAL LOW (ref 26.0–34.0)
MCHC: 31.8 g/dL (ref 30.0–36.0)
MCV: 78.1 fL — ABNORMAL LOW (ref 80.0–100.0)
Monocytes Absolute: 0.2 10*3/uL (ref 0.1–1.0)
Monocytes Relative: 3 %
Myelocytes: 3 %
Neutro Abs: 6.8 10*3/uL (ref 1.7–7.7)
Neutrophils Relative %: 83 %
Platelets: 195 10*3/uL (ref 150–400)
RBC: 4.43 MIL/uL (ref 4.22–5.81)
RDW: 22.1 % — ABNORMAL HIGH (ref 11.5–15.5)
WBC: 8.2 10*3/uL (ref 4.0–10.5)
nRBC: 0 % (ref 0.0–0.2)
nRBC: 0 /100 WBC

## 2018-10-09 LAB — BASIC METABOLIC PANEL WITH GFR
Anion gap: 12 (ref 5–15)
BUN: 67 mg/dL — ABNORMAL HIGH (ref 8–23)
CO2: 26 mmol/L (ref 22–32)
Calcium: 8.1 mg/dL — ABNORMAL LOW (ref 8.9–10.3)
Chloride: 104 mmol/L (ref 98–111)
Creatinine, Ser: 4.38 mg/dL — ABNORMAL HIGH (ref 0.61–1.24)
GFR calc Af Amer: 14 mL/min — ABNORMAL LOW
GFR calc non Af Amer: 12 mL/min — ABNORMAL LOW
Glucose, Bld: 121 mg/dL — ABNORMAL HIGH (ref 70–99)
Potassium: 3.3 mmol/L — ABNORMAL LOW (ref 3.5–5.1)
Sodium: 142 mmol/L (ref 135–145)

## 2018-10-09 LAB — HEPATITIS B SURFACE ANTIBODY,QUALITATIVE: Hep B S Ab: NONREACTIVE

## 2018-10-09 LAB — HEPATITIS B CORE ANTIBODY, TOTAL: Hep B Core Total Ab: NEGATIVE

## 2018-10-09 LAB — GLUCOSE, CAPILLARY: Glucose-Capillary: 72 mg/dL (ref 70–99)

## 2018-10-09 MED ORDER — POTASSIUM CHLORIDE CRYS ER 20 MEQ PO TBCR
20.0000 meq | EXTENDED_RELEASE_TABLET | Freq: Once | ORAL | Status: AC
  Administered 2018-10-09: 20 meq via ORAL
  Filled 2018-10-09: qty 1

## 2018-10-09 MED ORDER — SODIUM CHLORIDE 0.9 % IV SOLN
INTRAVENOUS | Status: DC | PRN
  Administered 2018-10-09: 10:00:00 via INTRAVENOUS
  Administered 2018-10-12: 07:00:00 5 mL via INTRAVENOUS
  Administered 2018-10-13: 06:00:00 via INTRAVENOUS

## 2018-10-09 MED ORDER — HEPARIN SODIUM (PORCINE) 1000 UNIT/ML IJ SOLN
INTRAMUSCULAR | Status: AC
  Administered 2018-10-09: 2600 [IU]
  Filled 2018-10-09: qty 3

## 2018-10-09 MED ORDER — SODIUM CHLORIDE 0.9 % IV SOLN
1000.0000 mg | Freq: Every day | INTRAVENOUS | Status: AC
  Administered 2018-10-09 – 2018-10-11 (×3): 1000 mg via INTRAVENOUS
  Filled 2018-10-09 (×4): qty 8

## 2018-10-09 NOTE — Progress Notes (Signed)
PROGRESS NOTE    Andrew Guerra  GHW:[id number] DOB: 11/27/1940 DOA: 10/04/2018 PCP: [name], MD   Brief Narrative:  Andrew Guerra a 78 y.o.malewith medical history significant ofhypertension, hyperlipidemia, bicuspid aortic valve with moderateaortic stenosis,NSCLCstatus post resection and radiation, PUD, chronic hypoxic respiratory failure on 2 L of oxygen via nasal cannulae at home,chronic kidney disease, chronic diastolic congestive heart failure with preserved ejection fractionpresents to emergency department with complaint of worsening leg swelling and SOB. He was asked by his PCP on Friday to go to the emergency department due to worsening kidney function. Patient reports exertional shortness of breath, worsening leg swelling. In the ED, BNP is elevated more than 3000, troponin is elevated at 108-->93. Found to have worsening renal function.  Nephrology consulted.  Started on dialysis.  Suspected to have  PLA-2R positive membranous nephropathy, nephrotic syndrome.   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 Posey Pronto as an outpatient, with recent medication adjustments Renal ultrasound showed echogenic kidneys consistent with medical renal disease, no hydronephrosis, ascites, bilateral pleural effusions Nephrology consulted, not responding to high-dose Lasix.Underwent right temp HD cath placed on 10/06/2018.  Patient underwent third  hemodialysis session today. Supspected to have  PLA-2R positive membranous nephropathy, nephrotic syndrome.Nephrology considering rituxibmab    Acute on chronic hypoxic respiratory failure/acute on chronic diastolic HF/moderate aortic stenosis: Worsening, requiring high flow oxygen Multifactorial, fluid overload/anasarca, history of lung cancer ABG showed hypoxia proBNP elevated Troponin slightly elevated, trended down, EKG with no acute ST changes Chest x-ray showed bilateral pleural effusion Echo showed EF of 55 to 60%, right ventricular pressure overload, basal/mid inferior wall and basal/mid inferolateral wall abnormality, pleural effusion Also on  IV Lasix Respiratory symptoms are likely secondary to pulmonary edema that improved with hemodialysis.   Patient will benefit from cardiology follow-up on discharge for valvular heart disease (aortic valve).  Added Coreg.  Hypertension Continue current regimen  Anemia of chronic kidney disease Hemoglobin stable  History of NSCLC Status post right upper lobectomy and radiation Continue supplemental oxygen as needed. He is on 2 L of oxygen via nasal cannula at home  GERD Continue Protonix         DVT prophylaxis: Heparin [place] Code Status: Full Family Communication: None present at the bedside Disposition Plan: Undetermined at this point.  Nephrology clearance needed for discharge   Consultants: Nephrology  Procedures: Dialysis  Antimicrobials:  Anti-infectives (From admission, onward)   None      Subjective:  Patient seen and examined the bedside this morning her dialysis suite.  Looks comfortable, hemodynamically stable.  Denies any chest pain or shortness of breath.  Still on 5 to 6 L of oxygen per minute.   Objective: Vitals:   10/09/18 1010 10/09/18 1110 10/09/18 1124 10/09/18 1130  BP: (!) 158/90 (!) 129/104 (!) 167/89 (!) 168/95  Pulse: 95 72 91 88  Resp: 19 (!) 26 (!) 21   Temp: 97.6 F (36.4 C) 97.6 F (36.4 C)    TempSrc: Oral Oral    SpO2: 94% 100%    Weight:  92.8 kg    Height:  Intake/Output Summary (Last 24 hours) at  10/09/2018 1228 Last data filed at 10/09/2018 1000 Gross per 24 hour  Intake 666 ml  Output 1150 ml  Net -484 ml   Filed Weights   10/08/18 0558 10/09/18 0452 10/09/18 1110  Weight: 93.8 kg 95.3 kg 92.8 kg    Examination:  General exam:Not in distress,average built HEENT:PERRL,Oral mucosa moist, Ear/Nose normal on gross exam, temporary dialysis catheter on the right neck Respiratory system: Bilateral decreased air entry in the bases, no wheezes or crackles auscultated Cardiovascular system: S1 & S2 heard, RRR. No JVD, murmurs, rubs, gallops or clicks. No pedal edema. Gastrointestinal system: Abdomen is nondistended, soft and nontender. No organomegaly or masses felt. Normal bowel sounds heard. Central nervous system: Alert and oriented. No focal neurological deficits. Extremities: No edema, no clubbing ,no cyanosis, distal peripheral pulses palpable. Skin: No rashes, lesions or ulcers,no icterus ,no pallor    Data Reviewed: I have personally reviewed following labs and imaging studies  CBC: Recent Labs  Lab 10/04/18 1320 10/05/18 1255 10/06/18 0450 10/06/18 1536 10/07/18 0309 10/09/18 1049  WBC 8.4 8.2 7.2 8.6 8.9 8.2  NEUTROABS 6.3 7.8* 5.8  --  7.3 PENDING  HGB 11.4* 11.2* 10.7* 11.6* 11.2* 11.0*  HCT 36.9* 34.5* 33.6* 36.7* 34.3* 34.6*  MCV 77.8* 76.3* 76.4* 77.1* 76.4* 78.1*  PLT 188 194 204 239 192 144   Basic Metabolic Panel: Recent Labs  Lab 10/04/18 1320 10/04/18 1628 10/05/18 1255 10/06/18 0450 10/07/18 0309 10/09/18 1049  NA 139  --  144 143 141 142  K 3.3*  --  3.5 3.3* 3.5 3.3*  CL 102  --  103 106 102 104  CO2 22  --  24 25 24 26   GLUCOSE 119*  --  96 98 90 121*  BUN 90*  --  91* 92* 70* 67*  CREATININE 4.71*  --  5.04* 5.20* 4.19* 4.38*  CALCIUM 8.5*  --  8.5* 8.5* 8.2* 8.1*  MG  --  2.0  --   --   --   --   PHOS  --  4.5  --  5.4*  --   --    GFR: Estimated Creatinine Clearance: 15.6 mL/min (A) (by C-G formula based on SCr of 4.38 mg/dL  (H)). Liver Function Tests: Recent Labs  Lab 10/05/18 1255 10/06/18 0450  AST 25  --   ALT 25  --   ALKPHOS 89  --   BILITOT 0.7  --   PROT 5.6*  --   ALBUMIN 2.2* 2.0*   No results for input(s): LIPASE, AMYLASE in the last 168 hours. No results for input(s): AMMONIA in the last 168 hours. Coagulation Profile: Recent Labs  Lab 10/04/18 1320  INR 1.1   Cardiac Enzymes: No results for input(s): CKTOTAL, CKMB, CKMBINDEX, TROPONINI in the last 168 hours. BNP (last 3 results) No results for input(s): PROBNP in the last 8760 hours. HbA1C: No results for input(s): HGBA1C in the last 72 hours. CBG: Recent Labs  Lab 10/06/18 0757 10/07/18 0753 10/08/18 0756 10/09/18 0758  GLUCAP 94 94 89 72   Lipid Profile: No results for input(s): CHOL, HDL, LDLCALC, TRIG, CHOLHDL, LDLDIRECT in the last 72 hours. Thyroid Function Tests: No results for input(s): TSH, T4TOTAL, FREET4, T3FREE, THYROIDAB in the last 72 hours. Anemia Panel: No results for input(s): VITAMINB12, FOLATE, FERRITIN, TIBC, IRON, RETICCTPCT in the last 72 hours. Sepsis Labs: No results for input(s): PROCALCITON, LATICACIDVEN in the last 168 hours.  Recent Results (  from the past 240 hour(s))  SARS CORONAVIRUS 2 (TAT 6-24 HRS) Nasopharyngeal Nasopharyngeal Swab     Status: None   Collection Time: 10/04/18  4:01 PM   Specimen: Nasopharyngeal Swab  Result Value Ref Range Status   SARS Coronavirus 2 NEGATIVE NEGATIVE Final    Comment: (NOTE) SARS-CoV-2 target nucleic acids are NOT DETECTED. The SARS-CoV-2 RNA is generally detectable in upper and lower respiratory specimens during the acute phase of infection. Negative results do not preclude SARS-CoV-2 infection, do not rule out co-infections with other pathogens, and should not be used as the sole basis for treatment or other patient management decisions. Negative results must be combined with clinical observations, patient history, and epidemiological information.  The expected result is Negative. Fact Sheet for Patients: SugarRoll.be Fact Sheet for Healthcare Providers: https://www.woods-mathews.com/ This test is not yet approved or cleared by the [place] FDA and  has been authorized for detection and/or diagnosis of SARS-CoV-2 by FDA under an Emergency Use Authorization (EUA). This EUA will remain  in effect (meaning this test can be used) for the duration of the COVID-19 declaration under Section 56 4(b)(1) of the Act, 21 U.S.C. section 360bbb-3(b)(1), unless the authorization is terminated or revoked sooner. Performed at [facility], [place] [address], [place], [place] [address]   MRSA PCR Screening     Status: None   Collection Time: 10/06/18  8:45 PM   Specimen: Nasopharyngeal  Result Value Ref Range Status   MRSA by PCR NEGATIVE NEGATIVE Final    Comment:        The GeneXpert MRSA Assay (FDA approved for NASAL specimens only), is one component of a comprehensive MRSA colonization surveillance program. It is not intended to diagnose MRSA infection nor to guide or monitor treatment for MRSA infections. Performed at [facility], [place] [address], [place], [place] [address]          Radiology Studies: No results found.      Scheduled Meds: . amLODipine  5 mg Oral Daily  . aspirin EC  81 mg Oral Daily  . carvedilol  3.125 mg Oral BID WC  . Chlorhexidine Gluconate Cloth  6 each Topical Q0600  . cholecalciferol  1,000 Units Oral Daily  . heparin injection (subcutaneous)  5,000 Units Subcutaneous Q8H  . hydrALAZINE  25 mg Oral Q8H  . influenza vaccine adjuvanted  0.5 mL Intramuscular Tomorrow-1000  . ipratropium-albuterol  3 mL Nebulization TID  . pantoprazole  40 mg Oral BID  . potassium chloride  20 mEq Oral Once   Continuous Infusions: . sodium chloride    . sodium chloride    . sodium chloride 10 mL/hr at 10/09/18 1027  . furosemide 160 mg (10/09/18 0620)      LOS: 3 days    Time spent: 35 mins.More than 50% of that time was spent in counseling and/or coordination of care.      Shelly Coss, MD [facility] Pager [phone]  If 7PM-7AM, please contact night-coverage [email or web address] Password [facility] 10/09/2018, 12:28 PM

## 2018-10-09 NOTE — Progress Notes (Signed)
PT Cancellation Note  Patient Details Name: Andrew Guerra MRN: [id number] DOB: [birth year]   Cancelled Treatment:    Reason Eval/Treat Not Completed: Patient at procedure or test/unavailable(pt in HD)   Shary [facility] 10/09/2018, 12:17 PM [place] Pager [phone] Office [phone]

## 2018-10-09 NOTE — Progress Notes (Signed)
Morning meds held until after dialysis

## 2018-10-09 NOTE — Progress Notes (Addendum)
[facility] Progress Note    Assessment/ Plan:   78 year old male with progressive renal failure, PLA2-Rpositive membranous nephropathy, anasarca. Despite very high-dose diuretics as an outpatient, his renal function is worse and his volume status is not improved. Potentially he is 40 pounds heavier than when we weighed him in July. 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   1. Progressive renal failure, AoCKD 4-- with evidence of volume overload and failing high-dose diuretics of Laisx 160 IV TID.  Needs dialysis.  HD #3 today 9/21. Continue Lasix for now between HD sessions; he is voiding (1600 ml/ past 24hr)  2. PLA 2Rpositive membranous nephropathy;nephrotic syndrome-- hasn't been treated to date, may consider ritux before d/c. He has advanced features on his biopsy and I will have to d/w spouse when she arrives today 1st before offering treatment. What may be reasonable is to give a few doses of Rituxan and watch closely for response. If no response then he is headed for permanent HD/ ESRD.  Seen on HD 9/21; I also spoke with his spouse Benjamine Mola and they want to start Cytoxan for the membranous nephropathy. Also d/w Dr. Posey Pronto who follows the pt and he prefers oral CTX  Will pulse with Solumedrol 1gm IV qdaily x3 doses followed by prednisone 0.5mg /kg for 28 days to start today with the pulse for mths 1,3,5 and then ctx oral mths 2,4,6. F/u with Dr. Posey Pronto in 2 weeks after d/c.  3. Acute on chronic Chronic diastolic heart failure  4. Bicuspid aortic valve with moderate aortic stenosis  5. Progressive physical decline  6. History of non-small cell lung cancer  7. Hypertension, currently uncontrolled  Subjective:   Breathing better and able to sit in chair and tolerate PT (up and down the hallway).   Objective:   BP (!) 137/93   Pulse 80   Temp 97.7 F (36.5 C)   Resp 20   Ht 5\' 10"  (1.778 m)   Wt 95.3 kg   SpO2 96%    BMI 30.13 kg/m   Intake/Output Summary (Last 24 hours) at 10/09/2018 0715 Last data filed at 10/09/2018 0453 Gross per 24 hour  Intake 720 ml  Output 1600 ml  Net -880 ml   Weight change: 1.155 kg  Physical Exam: Gen: older gentleman, sitting upright in bed, speaking in 2-3 word sentences CVS:RRR Resp: crackles throughout Abd: some abd fullness Ext:3+ anasarca  Imaging: No results found.  Labs: BMET Recent Labs  Lab 10/02/18 0830 10/04/18 1320 10/04/18 1628 10/05/18 1255 10/06/18 0450 10/07/18 0309  NA 142 139  --  144 143 141  K 3.2* 3.3*  --  3.5 3.3* 3.5  CL 102 102  --  103 106 102  CO2 26 22  --  24 25 24   GLUCOSE 121* 119*  --  96 98 90  BUN 92* 90*  --  91* 92* 70*  CREATININE 4.94* 4.71*  --  5.04* 5.20* 4.19*  CALCIUM 8.3* 8.5*  --  8.5* 8.5* 8.2*  PHOS 4.7*  --  4.5  --  5.4*  --    CBC Recent Labs  Lab 10/04/18 1320 10/05/18 1255 10/06/18 0450 10/06/18 1536 10/07/18 0309  WBC 8.4 8.2 7.2 8.6 8.9  NEUTROABS 6.3 7.8* 5.8  --  7.3  HGB 11.4* 11.2* 10.7* 11.6* 11.2*  HCT 36.9* 34.5* 33.6* 36.7* 34.3*  MCV 77.8* 76.3* 76.4* 77.1* 76.4*  PLT 188 194 204 239 192  Medications:    . amLODipine  5 mg Oral Daily  . aspirin EC  81 mg Oral Daily  . carvedilol  3.125 mg Oral BID WC  . Chlorhexidine Gluconate Cloth  6 each Topical Q0600  . cholecalciferol  1,000 Units Oral Daily  . heparin injection (subcutaneous)  5,000 Units Subcutaneous Q8H  . hydrALAZINE  25 mg Oral Q8H  . influenza vaccine adjuvanted  0.5 mL Intramuscular Tomorrow-1000  . ipratropium-albuterol  3 mL Nebulization TID  . pantoprazole  40 mg Oral BID      Otelia Santee, MD 10/09/2018, 7:15 AM

## 2018-10-10 LAB — BASIC METABOLIC PANEL
Anion gap: 10 (ref 5–15)
BUN: 54 mg/dL — ABNORMAL HIGH (ref 8–23)
CO2: 26 mmol/L (ref 22–32)
Calcium: 8.2 mg/dL — ABNORMAL LOW (ref 8.9–10.3)
Chloride: 101 mmol/L (ref 98–111)
Creatinine, Ser: 3.78 mg/dL — ABNORMAL HIGH (ref 0.61–1.24)
GFR calc Af Amer: 17 mL/min — ABNORMAL LOW (ref >60)
GFR calc non Af Amer: 14 mL/min — ABNORMAL LOW (ref >60)
Glucose, Bld: 142 mg/dL — ABNORMAL HIGH (ref 70–99)
Potassium: 4.3 mmol/L (ref 3.5–5.1)
Sodium: 137 mmol/L (ref 135–145)

## 2018-10-10 LAB — GLUCOSE, CAPILLARY: Glucose-Capillary: 129 mg/dL — ABNORMAL HIGH (ref 70–99)

## 2018-10-10 MED ORDER — IPRATROPIUM-ALBUTEROL 0.5-2.5 (3) MG/3ML IN SOLN
3.0000 mL | Freq: Two times a day (BID) | RESPIRATORY_TRACT | Status: DC
  Administered 2018-10-10 – 2018-10-11 (×2): 3 mL via RESPIRATORY_TRACT
  Filled 2018-10-10 (×2): qty 3

## 2018-10-10 MED ORDER — POTASSIUM CHLORIDE CRYS ER 20 MEQ PO TBCR
40.0000 meq | EXTENDED_RELEASE_TABLET | Freq: Once | ORAL | Status: AC
  Administered 2018-10-10: 13:00:00 40 meq via ORAL
  Filled 2018-10-10: qty 2

## 2018-10-10 NOTE — Progress Notes (Signed)
PROGRESS NOTE    Andrew Guerra  WJX:[id number] DOB: July 12, 1940 DOA: 10/04/2018 PCP: [name], MD   Brief Narrative:  Andrew Guerra a 78 y.o.malewith medical history significant ofhypertension, hyperlipidemia, bicuspid aortic valve with moderateaortic stenosis,NSCLCstatus post resection and radiation, PUD, chronic hypoxic respiratory failure on 2 L of oxygen via nasal cannulae at home,chronic kidney disease, chronic diastolic congestive heart failure with preserved ejection fractionpresents to emergency department with complaint of worsening leg swelling and SOB. He was asked by his PCP on Friday to go to the emergency department due to worsening kidney function. Patient reported exertional shortness of breath, worsening leg swelling. In the ED, BNP is elevated more than 3000, troponin is elevated at 108-->93. Found to have worsening renal function.  Nephrology consulted.  Started on dialysis.  Suspected to have  PLA-2R positive membranous nephropathy, nephrotic syndrome.Started on solumedrol.   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 Posey Pronto as an outpatient, with recent medication adjustments. Renal ultrasound showed echogenic kidneys consistent with medical renal disease, no hydronephrosis, ascites, bilateral pleural effusions. Nephrology consulted, not responding to high-dose Lasix.Underwent right temp HD cath placed on 10/06/2018. Patient underwent third  hemodialysis session on 10/09/18. Supspected to have  PLA-2R positive membranous nephropathy, nephrotic  syndrome.Nephrology started on solumedrol.Also considering rituxan.   Acute on chronic hypoxic respiratory failure/acute on chronic diastolic HF/moderate aortic stenosis: Multifactorial, fluid overload/anasarca, history of lung cancer ABG showed hypoxia proBNP elevated Troponin slightly elevated, trended down, EKG with no acute ST changes Chest x-ray showed bilateral pleural effusion Echo showed EF of 55 to 60%, right ventricular pressure overload, basal/mid inferior wall and basal/mid inferolateral wall abnormality, pleural effusion Currently  On  IV Lasix Respiratory symptoms are likely secondary to pulmonary edema that improved with hemodialysis.  Patient will benefit from cardiology follow-up on discharge for valvular heart disease (aortic valve).  Today he is on 5 L of oxygen per minute.  On baseline he is on 2 L of oxygen for chronic respiratory insufficiency from CHF  Hypertension Continue current regimen  Anemia of chronic kidney disease Hemoglobin stable  History of NSCLC Status post right upper lobectomy and radiation  GERD Continue Protonix         DVT prophylaxis: Heparin [place] Code Status: Full Family Communication: None present at the bedside Disposition Plan: Undetermined at this point.  Nephrology clearance needed for discharge   Consultants: Nephrology  Procedures: Dialysis  Antimicrobials:  Anti-infectives (From admission, onward)   None      Subjective:  Patient seen and examined at bedside this morning.  Hemodynamically stable.  Comfortable.  He was eating breakfast on the bed.  Denies any shortness of breath or chest pain.  On 5 L of oxygen per minute.  He states he feels better today.   Objective: Vitals:   10/10/18 0435 10/10/18 0619 10/10/18 0755 10/10/18 0824  BP:  (!) 145/78 (!) 153/82   Pulse:   87   Resp:  18 (!) 22   Temp: 98.1 F (36.7 C) 98.4 F (36.9 C) 97.9 F (36.6 C)   TempSrc: Oral Oral Oral  SpO2:  96% 96% 96%   Weight: 92.4 kg     Height:        Intake/Output Summary (Last 24 hours) at 10/10/2018 1138 Last data filed at 10/10/2018 6546 Gross per 24 hour  Intake 452 ml  Output 2900 ml  Net -2448 ml   Filed Weights   10/09/18 1110 10/09/18 1454 10/10/18 0435  Weight: 92.8 kg 90.6 kg 92.4 kg    Examination:  General exam: Appears calm and comfortable ,Not in distress,obese HEENT:PERRL,Oral mucosa moist, Ear/Nose normal on gross exam, temporary dialysis catheter on the right neck Respiratory system: Bilateral mild decreased air entry on the bases, otherwise mostly clear Cardiovascular system: S1 & S2 heard, RRR. No JVD, murmurs, rubs, gallops or clicks. Gastrointestinal system: Abdomen is nondistended, soft and nontender. No organomegaly or masses felt. Normal bowel sounds heard. Central nervous system: Alert and oriented. No focal neurological deficits. Extremities: Trace bilateral lower extremity edema, no clubbing ,no cyanosis, chronic skin changes with dryness and segmentation Skin: No rashes, lesions or ulcers,no icterus ,no pallor Psychiatry: Judgement and insight appear normal. Mood & affect appropriate.     Data Reviewed: I have personally reviewed following labs and imaging studies  CBC: Recent Labs  Lab 10/04/18 1320 10/05/18 1255 10/06/18 0450 10/06/18 1536 10/07/18 0309 10/09/18 1049  WBC 8.4 8.2 7.2 8.6 8.9 8.2  NEUTROABS 6.3 7.8* 5.8  --  7.3 6.8  HGB 11.4* 11.2* 10.7* 11.6* 11.2* 11.0*  HCT 36.9* 34.5* 33.6* 36.7* 34.3* 34.6*  MCV 77.8* 76.3* 76.4* 77.1* 76.4* 78.1*  PLT 188 194 204 239 192 503   Basic Metabolic Panel: Recent Labs  Lab 10/04/18 1320 10/04/18 1628 10/05/18 1255 10/06/18 0450 10/07/18 0309 10/09/18 1049  NA 139  --  144 143 141 142  K 3.3*  --  3.5 3.3* 3.5 3.3*  CL 102  --  103 106 102 104  CO2 22  --  24 25 24 26   GLUCOSE 119*  --  96 98 90 121*  BUN 90*  --  91* 92* 70* 67*  CREATININE 4.71*  --  5.04* 5.20* 4.19* 4.38*  CALCIUM  8.5*  --  8.5* 8.5* 8.2* 8.1*  MG  --  2.0  --   --   --   --   PHOS  --  4.5  --  5.4*  --   --    GFR: Estimated Creatinine Clearance: 15.6 mL/min (A) (by C-G formula based on SCr of 4.38 mg/dL (H)). Liver Function Tests: Recent Labs  Lab 10/05/18 1255 10/06/18 0450  AST 25  --   ALT 25  --   ALKPHOS 89  --   BILITOT 0.7  --   PROT 5.6*  --   ALBUMIN 2.2* 2.0*   No results for input(s): LIPASE, AMYLASE in the last 168 hours. No results for input(s): AMMONIA in the last 168 hours. Coagulation Profile: Recent Labs  Lab 10/04/18 1320  INR 1.1   Cardiac Enzymes: No results for input(s): CKTOTAL, CKMB, CKMBINDEX, TROPONINI in the last 168 hours. BNP (last 3 results) No results for input(s): PROBNP in the last 8760 hours. HbA1C: No results for input(s): HGBA1C in the last 72 hours. CBG: Recent Labs  Lab 10/06/18 0757 10/07/18 0753 10/08/18 0756 10/09/18 0758 10/10/18 0753  GLUCAP 94 94 89 72 129*   Lipid Profile: No results for input(s): CHOL, HDL, LDLCALC, TRIG, CHOLHDL, LDLDIRECT in the last 72 hours. Thyroid Function Tests: No results for input(s): TSH, T4TOTAL,  FREET4, T3FREE, THYROIDAB in the last 72 hours. Anemia Panel: No results for input(s): VITAMINB12, FOLATE, FERRITIN, TIBC, IRON, RETICCTPCT in the last 72 hours. Sepsis Labs: No results for input(s): PROCALCITON, LATICACIDVEN in the last 168 hours.  Recent Results (from the past 240 hour(s))  SARS CORONAVIRUS 2 (TAT 6-24 HRS) Nasopharyngeal Nasopharyngeal Swab     Status: None   Collection Time: 10/04/18  4:01 PM   Specimen: Nasopharyngeal Swab  Result Value Ref Range Status   SARS Coronavirus 2 NEGATIVE NEGATIVE Final    Comment: (NOTE) SARS-CoV-2 target nucleic acids are NOT DETECTED. The SARS-CoV-2 RNA is generally detectable in upper and lower respiratory specimens during the acute phase of infection. Negative results do not preclude SARS-CoV-2 infection, do not rule out co-infections with  other pathogens, and should not be used as the sole basis for treatment or other patient management decisions. Negative results must be combined with clinical observations, patient history, and epidemiological information. The expected result is Negative. Fact Sheet for Patients: SugarRoll.be Fact Sheet for Healthcare Providers: https://www.woods-mathews.com/ This test is not yet approved or cleared by the [place] FDA and  has been authorized for detection and/or diagnosis of SARS-CoV-2 by FDA under an Emergency Use Authorization (EUA). This EUA will remain  in effect (meaning this test can be used) for the duration of the COVID-19 declaration under Section 56 4(b)(1) of the Act, 21 U.S.C. section 360bbb-3(b)(1), unless the authorization is terminated or revoked sooner. Performed at [facility], [place] [address] [place], [place] [address]   MRSA PCR Screening     Status: None   Collection Time: 10/06/18  8:45 PM   Specimen: Nasopharyngeal  Result Value Ref Range Status   MRSA by PCR NEGATIVE NEGATIVE Final    Comment:        The GeneXpert MRSA Assay (FDA approved for NASAL specimens only), is one component of a comprehensive MRSA colonization surveillance program. It is not intended to diagnose MRSA infection nor to guide or monitor treatment for MRSA infections. Performed at [facility], [place] [address] [place], [place] [address]          Radiology Studies: No results found.      Scheduled Meds: . amLODipine  5 mg Oral Daily  . aspirin EC  81 mg Oral Daily  . carvedilol  3.125 mg Oral BID WC  . Chlorhexidine Gluconate Cloth  6 each Topical Q0600  . cholecalciferol  1,000 Units Oral Daily  . heparin injection (subcutaneous)  5,000 Units Subcutaneous Q8H  . hydrALAZINE  25 mg Oral Q8H  . influenza vaccine adjuvanted  0.5 mL Intramuscular Tomorrow-1000  . ipratropium-albuterol  3 mL Nebulization BID   . pantoprazole  40 mg Oral BID  . potassium chloride  40 mEq Oral Once   Continuous Infusions: . sodium chloride    . sodium chloride    . sodium chloride 10 mL/hr at 10/09/18 1027  . furosemide 160 mg (10/10/18 0627)  . methylPREDNISolone (SOLU-MEDROL) injection 1,000 mg (10/10/18 0950)     LOS: 4 days    Time spent: 35 mins.More than 50% of that time was spent in counseling and/or coordination of care.      Shelly Coss, MD [facility] Pager [phone]  If 7PM-7AM, please contact night-coverage [email or web address] Password [facility] 10/10/2018, 11:38 AM

## 2018-10-10 NOTE — Plan of Care (Signed)
  Problem: Clinical Measurements: Goal: Respiratory complications will improve Outcome: Progressing   

## 2018-10-10 NOTE — Progress Notes (Signed)
[facility] Progress Note    Assessment/ Plan:   78 year old male with progressive renal failure, PLA2-Rpositive membranous nephropathy, anasarca. Despite very high-dose diuretics as an outpatient, his renal function is worse and his volume status is not improved. Potentially he is 40 pounds heavier than when we weighed him in July. 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   HD 9/21  1. Progressive renal failure, AoCKD 4-- with evidence of volume overload and failing high-dose diuretics of Lasix 160 IV TID. Needs dialysis. HD #3 today 9/21. Continue Lasix for now between HD sessions; he is voiding (1600 ml/ past 24hr)  2. PLA 2Rpositive membranous nephropathy;nephrotic syndrome-- hasn't been treated to date, may consider ritux before d/c. He has advanced features on his biopsy and I  d/w spouse Benjamine Mola -> they want treatment.   Started with modified Ponticelli protocol  with Solumedrol 1gm IV qdaily x3 doses (started 9/21)  followed by prednisone 0.5mg /kg for 28 days for mths 1,3,5 and then ctx oral mths 2,4,6. F/u with Dr. Posey Pronto in 2 weeks after d/c.   WIll check labs tomorrow and determine if he needs HD; if he still needs HD in the AM then we will set up for outpt HD.  3. Acute on chronic Chronic diastolic heart failure  4. Bicuspid aortic valve with moderate aortic stenosis  5. Progressive physical decline  6. History of non-small cell lung cancer  7. Hypertension, currently uncontrolled  Subjective:   Breathing better and able to sit in chair and tolerate PT (up and down the hallway).   Objective:   BP (!) 153/82   Pulse 87   Temp 97.9 F (36.6 C) (Oral)   Resp (!) 22   Ht 5\' 10"  (1.778 m)   Wt 92.4 kg   SpO2 96%   BMI 29.23 kg/m   Intake/Output Summary (Last 24 hours) at 10/10/2018 1653 Last data filed at 10/10/2018 1543 Gross per 24 hour  Intake 470.04 ml  Output 900 ml  Net -429.96 ml   Weight  change: -2.455 kg  Physical Exam: Gen: older gentleman, sitting upright in bed, speaking in 2-3 word sentences CVS:RRR Resp: crackles throughout Abd: some abd fullness Ext:3+ anasarca  Imaging: No results found.  Labs: BMET Recent Labs  Lab 10/04/18 1320 10/04/18 1628 10/05/18 1255 10/06/18 0450 10/07/18 0309 10/09/18 1049  NA 139  --  144 143 141 142  K 3.3*  --  3.5 3.3* 3.5 3.3*  CL 102  --  103 106 102 104  CO2 22  --  24 25 24 26   GLUCOSE 119*  --  96 98 90 121*  BUN 90*  --  91* 92* 70* 67*  CREATININE 4.71*  --  5.04* 5.20* 4.19* 4.38*  CALCIUM 8.5*  --  8.5* 8.5* 8.2* 8.1*  PHOS  --  4.5  --  5.4*  --   --    CBC Recent Labs  Lab 10/05/18 1255 10/06/18 0450 10/06/18 1536 10/07/18 0309 10/09/18 1049  WBC 8.2 7.2 8.6 8.9 8.2  NEUTROABS 7.8* 5.8  --  7.3 6.8  HGB 11.2* 10.7* 11.6* 11.2* 11.0*  HCT 34.5* 33.6* 36.7* 34.3* 34.6*  MCV 76.3* 76.4* 77.1* 76.4* 78.1*  PLT 194 204 239 192 195    Medications:    . amLODipine  5 mg Oral Daily  . aspirin EC  81 mg Oral Daily  . carvedilol  3.125 mg Oral BID WC  . Chlorhexidine Gluconate  Cloth  6 each Topical [id number]  . cholecalciferol  1,000 Units Oral Daily  . heparin injection (subcutaneous)  5,000 Units Subcutaneous Q8H  . hydrALAZINE  25 mg Oral Q8H  . influenza vaccine adjuvanted  0.5 mL Intramuscular Tomorrow-1000  . ipratropium-albuterol  3 mL Nebulization BID  . pantoprazole  40 mg Oral BID      Otelia Santee, MD 10/10/2018, 4:53 PM

## 2018-10-10 NOTE — Progress Notes (Signed)
Physical Therapy Treatment Patient Details Name: Andrew Guerra MRN: [id number] DOB: [birth year] Today's Date: 10/10/2018    History of Present Illness 78 y.o. 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    PT Comments    Patient seen for mobility progression. Pt is making progress toward PT goals and tolerated session well. Pt requires 6L O2 via [place] while ambulating and on 5L at rest in room upon arrival. Continue to progress as tolerated. Current plan remains appropriate.     Follow Up Recommendations  Home health PT;Supervision for mobility/OOB     Equipment Recommendations  None recommended by PT    Recommendations for Other Services       Precautions / Restrictions Precautions Precautions: Fall;Other (comment) Precaution Comments: watch sats Restrictions Weight Bearing Restrictions: No    Mobility  Bed Mobility               General bed mobility comments: Pt OOB in chair upon arrival  Transfers Overall transfer level: Needs assistance Equipment used: Rolling walker (2 wheeled) Transfers: Sit to/from Stand Sit to Stand: Supervision         General transfer comment: supervision for safety  Ambulation/Gait Ambulation/Gait assistance: Min guard Gait Distance (Feet): 150 Feet Assistive device: Rolling walker (2 wheeled) Gait Pattern/deviations: Step-through pattern;Decreased stride length Gait velocity: decreased   General Gait Details: min guard for safety; steady gait with RW; pt on 6L O2 via [place]    Stairs             Wheelchair Mobility    Modified Rankin (Stroke  Patients Only)       Balance Overall balance assessment: Needs assistance Sitting-balance support: No upper extremity supported;Feet supported Sitting balance-Leahy Scale: Good     Standing balance support: During functional activity Standing balance-Leahy Scale: Poor Standing balance comment: reliant on BUE support                            Cognition Arousal/Alertness: Awake/alert Behavior During Therapy: WFL for tasks assessed/performed Overall Cognitive Status: Within Functional Limits for tasks assessed                                        Exercises      General Comments        Pertinent Vitals/Pain Pain Assessment: No/denies pain    Home Living                      Prior Function            PT Goals (current goals can now be found in the care plan section) Acute Rehab PT Goals Patient Stated Goal: home Progress towards PT goals: Progressing toward goals    Frequency    Min 3X/week      PT Plan Current plan remains appropriate    Co-evaluation              AM-PAC PT "6 Clicks" Mobility   Outcome Measure  Help needed turning from your back to your side while in  a flat bed without using bedrails?: None Help needed moving from lying on your back to sitting on the side of a flat bed without using bedrails?: A Little Help needed moving to and from a bed to a chair (including a wheelchair)?: A Little Help needed standing up from a chair using your arms (e.g., wheelchair or bedside chair)?: A Little Help needed to walk in hospital room?: A Little Help needed climbing 3-5 steps with a railing? : A Lot 6 Click Score: 18    End of Session Equipment Utilized During Treatment: Oxygen;Gait belt Activity Tolerance: Patient tolerated treatment well Patient left: in chair;with call bell/phone within reach;with family/visitor present Nurse Communication: Mobility status PT Visit Diagnosis: Other abnormalities of  gait and mobility (R26.89)     Time: 5053-9767 PT Time Calculation (min) (ACUTE ONLY): 30 min  Charges:  $Gait Training: 23-37 mins                     Earney Navy, PTA Acute Rehabilitation Services Pager: [phone] Office: [phone]     Darliss Cheney 10/10/2018, 5:11 PM

## 2018-10-11 ENCOUNTER — Inpatient Hospital Stay (HOSPITAL_COMMUNITY): Payer: Medicare Other

## 2018-10-11 ENCOUNTER — Encounter (HOSPITAL_COMMUNITY): Payer: Self-pay | Admitting: Interventional Radiology

## 2018-10-11 HISTORY — PX: IR FLUORO GUIDE CV LINE RIGHT: IMG2283

## 2018-10-11 LAB — CBC
HCT: 32.5 % — ABNORMAL LOW (ref 39.0–52.0)
Hemoglobin: 10.5 g/dL — ABNORMAL LOW (ref 13.0–17.0)
MCH: 24.7 pg — ABNORMAL LOW (ref 26.0–34.0)
MCHC: 32.3 g/dL (ref 30.0–36.0)
MCV: 76.5 fL — ABNORMAL LOW (ref 80.0–100.0)
Platelets: 179 10*3/uL (ref 150–400)
RBC: 4.25 MIL/uL (ref 4.22–5.81)
RDW: 21.6 % — ABNORMAL HIGH (ref 11.5–15.5)
WBC: 9.5 10*3/uL (ref 4.0–10.5)
nRBC: 0.3 % — ABNORMAL HIGH (ref 0.0–0.2)

## 2018-10-11 LAB — BASIC METABOLIC PANEL
Anion gap: 11 (ref 5–15)
BUN: 64 mg/dL — ABNORMAL HIGH (ref 8–23)
CO2: 21 mmol/L — ABNORMAL LOW (ref 22–32)
Calcium: 8.1 mg/dL — ABNORMAL LOW (ref 8.9–10.3)
Chloride: 105 mmol/L (ref 98–111)
Creatinine, Ser: 4.18 mg/dL — ABNORMAL HIGH (ref 0.61–1.24)
GFR calc Af Amer: 15 mL/min — ABNORMAL LOW (ref >60)
GFR calc non Af Amer: 13 mL/min — ABNORMAL LOW (ref >60)
Glucose, Bld: 127 mg/dL — ABNORMAL HIGH (ref 70–99)
Potassium: 4.8 mmol/L (ref 3.5–5.1)
Sodium: 137 mmol/L (ref 135–145)

## 2018-10-11 LAB — GLUCOSE, CAPILLARY: Glucose-Capillary: 126 mg/dL — ABNORMAL HIGH (ref 70–99)

## 2018-10-11 LAB — PROTIME-INR
INR: 1.1 (ref 0.8–1.2)
Prothrombin Time: 14.2 seconds (ref 11.4–15.2)

## 2018-10-11 MED ORDER — MIDAZOLAM HCL 2 MG/2ML IJ SOLN
INTRAMUSCULAR | Status: AC
  Filled 2018-10-11: qty 4

## 2018-10-11 MED ORDER — MIDAZOLAM HCL 2 MG/2ML IJ SOLN
INTRAMUSCULAR | Status: AC | PRN
  Administered 2018-10-11: 1 mg via INTRAVENOUS

## 2018-10-11 MED ORDER — HEPARIN SODIUM (PORCINE) 5000 UNIT/ML IJ SOLN
5000.0000 [IU] | Freq: Three times a day (TID) | INTRAMUSCULAR | Status: DC
  Administered 2018-10-11 – 2018-10-13 (×5): 5000 [IU] via SUBCUTANEOUS
  Filled 2018-10-11 (×5): qty 1

## 2018-10-11 MED ORDER — CEFAZOLIN SODIUM-DEXTROSE 2-4 GM/100ML-% IV SOLN
2.0000 g | Freq: Once | INTRAVENOUS | Status: AC
  Administered 2018-10-11: 16:00:00 2 g via INTRAVENOUS

## 2018-10-11 MED ORDER — FENTANYL CITRATE (PF) 100 MCG/2ML IJ SOLN
INTRAMUSCULAR | Status: AC
  Filled 2018-10-11: qty 4

## 2018-10-11 MED ORDER — HEPARIN SODIUM (PORCINE) 1000 UNIT/ML IJ SOLN
INTRAMUSCULAR | Status: AC
  Filled 2018-10-11: qty 1

## 2018-10-11 MED ORDER — LIDOCAINE HCL 1 % IJ SOLN
INTRAMUSCULAR | Status: AC
  Filled 2018-10-11: qty 20

## 2018-10-11 MED ORDER — FENTANYL CITRATE (PF) 100 MCG/2ML IJ SOLN
INTRAMUSCULAR | Status: AC | PRN
  Administered 2018-10-11: 50 ug via INTRAVENOUS

## 2018-10-11 MED ORDER — RENA-VITE PO TABS
1.0000 | ORAL_TABLET | Freq: Every day | ORAL | Status: DC
  Administered 2018-10-12: 22:00:00 1 via ORAL
  Filled 2018-10-11: qty 1

## 2018-10-11 MED ORDER — LIDOCAINE HCL 1 % IJ SOLN
INTRAMUSCULAR | Status: AC | PRN
  Administered 2018-10-11: 10 mL

## 2018-10-11 MED ORDER — CEFAZOLIN SODIUM-DEXTROSE 1-4 GM/50ML-% IV SOLN
1.0000 g | Freq: Once | INTRAVENOUS | Status: DC
  Filled 2018-10-11: qty 50

## 2018-10-11 MED ORDER — CEFAZOLIN SODIUM-DEXTROSE 2-4 GM/100ML-% IV SOLN
INTRAVENOUS | Status: AC
  Filled 2018-10-11: qty 100

## 2018-10-11 MED ORDER — PRO-STAT SUGAR FREE PO LIQD
30.0000 mL | Freq: Two times a day (BID) | ORAL | Status: DC
  Administered 2018-10-12 – 2018-10-13 (×3): 30 mL via ORAL
  Filled 2018-10-11 (×3): qty 30

## 2018-10-11 MED ORDER — CHLORHEXIDINE GLUCONATE 4 % EX LIQD
CUTANEOUS | Status: AC
  Filled 2018-10-11: qty 15

## 2018-10-11 NOTE — Progress Notes (Signed)
[facility] Progress Note    Assessment/ Plan:   78 year old male with progressive renal failure, PLA2-Rpositive membranous nephropathy, anasarca. Despite very high-dose diuretics as an outpatient, his renal function is worse and his volume status is not improved. Potentially he is 40 pounds heavier than when we weighed him in July. 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   HD 9/21  1. Progressive renal failure, AoCKD 4-- with evidence of volume overload and failing high-dose diuretics of Lasix 160 IV TID. Needs dialysis. HD #3 today9/21.   Continue Lasix for now between HD sessions; he is voiding (1000 ml/ past 24hr)  Plan on HD tomorrow  CLIP started for outpt dialysis for AKI on CKD 24hr urine for cr cl starting today as well  Requested for VIR to convert to TC.  2. PLA 2Rpositive membranous nephropathy;nephrotic syndrome-- hasn't been treated to date, may consider ritux before d/c. He has advanced features on his biopsy and I  d/w spouse Benjamine Mola -> they want treatment.   Started with modified Ponticelli protocol  with Solumedrol 1gm IV qdaily x3 doses (started 9/21)  followed by prednisone 0.5mg /kg for 28 days for mths 1,3,5 and then ctx oral mths 2,4,6. F/u with Dr. Posey Pronto in 2 weeks after d/c.   Last day of solumedrol pulse today; will start prednisone 60mg  daily tomorrow.    3. Acute on chronic Chronic diastolic heart failure  4. Bicuspid aortic valve with moderate aortic stenosis  5. Progressive physical decline  6. History of non-small cell lung cancer  7. Hypertension, currently uncontrolled  Subjective:   Breathing betterand able to sit in chair and tolerate PT (up and down the hallway).   Objective:   BP 138/81 (BP Location: Right Arm)   Pulse 69   Temp 97.6 F (36.4 C) (Oral)   Resp (!) 23   Ht 5\' 10"  (1.778 m)   Wt 93 kg   SpO2 97%   BMI 29.42 kg/m   Intake/Output Summary (Last  24 hours) at 10/11/2018 0017 Last data filed at 10/11/2018 0200 Gross per 24 hour  Intake 324.04 ml  Output -  Net 324.04 ml   Weight change: 0.2 kg  Physical Exam: Gen: older gentleman, sitting upright in bed CVS:RRR Resp: CTA  Abd: some abd fullness Ext:2-3+ anasarca  Imaging: No results found.  Labs: BMET Recent Labs  Lab 10/04/18 1320 10/04/18 1628 10/05/18 1255 10/06/18 0450 10/07/18 0309 10/09/18 1049 10/10/18 1710 10/11/18 0320  NA 139  --  144 143 141 142 137 137  K 3.3*  --  3.5 3.3* 3.5 3.3* 4.3 4.8  CL 102  --  103 106 102 104 101 105  CO2 22  --  24 25 24 26 26  21*  GLUCOSE 119*  --  96 98 90 121* 142* 127*  BUN 90*  --  91* 92* 70* 67* 54* 64*  CREATININE 4.71*  --  5.04* 5.20* 4.19* 4.38* 3.78* 4.18*  CALCIUM 8.5*  --  8.5* 8.5* 8.2* 8.1* 8.2* 8.1*  PHOS  --  4.5  --  5.4*  --   --   --   --    CBC Recent Labs  Lab 10/05/18 1255 10/06/18 0450 10/06/18 1536 10/07/18 0309 10/09/18 1049  WBC 8.2 7.2 8.6 8.9 8.2  NEUTROABS 7.8* 5.8  --  7.3 6.8  HGB 11.2* 10.7* 11.6* 11.2* 11.0*  HCT 34.5* 33.6* 36.7* 34.3* 34.6*  MCV 76.3* 76.4* 77.1* 76.4* 78.1*  PLT 194 204 239 192 195    Medications:    . amLODipine  5 mg Oral Daily  . aspirin EC  81 mg Oral Daily  . carvedilol  3.125 mg Oral BID WC  . Chlorhexidine Gluconate Cloth  6 each Topical Q0600  . cholecalciferol  1,000 Units Oral Daily  . heparin injection (subcutaneous)  5,000 Units Subcutaneous Q8H  . hydrALAZINE  25 mg Oral Q8H  . influenza vaccine adjuvanted  0.5 mL Intramuscular Tomorrow-1000  . ipratropium-albuterol  3 mL Nebulization BID  . pantoprazole  40 mg Oral BID      Otelia Santee, MD 10/11/2018, 6:48 AM

## 2018-10-11 NOTE — Progress Notes (Signed)
PROGRESS NOTE    Andrew [facility]erra  XFG:[id number] DOB: 1940-02-15 DOA: 10/04/2018 PCP: [name], MD   Brief Narrative:  Andrew Guerra a 78 y.o.malewith medical history significant ofhypertension, hyperlipidemia, bicuspid aortic valve with moderateaortic stenosis,NSCLCstatus post resection and radiation, PUD, chronic hypoxic respiratory failure on 2 L of oxygen via nasal cannulae at home,chronic kidney disease, chronic diastolic congestive heart failure with preserved ejection fractionpresents to emergency department with complaint of worsening leg swelling and SOB. He was asked by his PCP on Friday to go to the emergency department due to worsening kidney function. Patient reported exertional shortness of breath, worsening leg swelling. In the ED, BNP was elevated more than 3000, troponin is elevated at 108-->93. Found to have worsening renal function.  Nephrology consulted.  Started on dialysis.  Suspected to have  PLA-2R positive membranous nephropathy, nephrotic syndrome.Started on solumedrol.Nephrology planning to initiate outpatient dialysis.   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 Posey Pronto as an outpatient. Renal ultrasound showed echogenic kidneys consistent with medical renal disease, no hydronephrosis, ascites, bilateral pleural effusions. Nephrology consulted, not responding to high-dose Lasix.Underwent right neck jgtemp HD cath placed on 10/06/2018. Patient underwent third  hemodialysis session on 10/09/18. Supspected to have  PLA-2R positive membranous nephropathy, nephrotic syndrome.Nephrology started on  solumedrol.Plan to continue steroid in tapering dose. Plan for HD tomorrow.Plan is to put tunneled catheter.   Acute on chronic hypoxic respiratory failure/acute on chronic diastolic HF/moderate aortic stenosis: Multifactorial, fluid overload/anasarca, history of lung cancer ABG showed hypoxia proBNP elevated Troponin slightly elevated, trended down, EKG with no acute ST changes Chest x-ray showed bilateral pleural effusion Echo showed EF of 55 to 60%, right ventricular pressure overload, basal/mid inferior wall and basal/mid inferolateral wall abnormality, pleural effusion Currently  On  IV Lasix Respiratory symptoms are likely secondary to pulmonary edema that improved with hemodialysis.  Patient will benefit from cardiology follow-up on discharge for valvular heart disease (aortic valve).  Today he is on 4 L of oxygen per minute.  On baseline he is on 2 L of oxygen for chronic respiratory insufficiency from CHF  Hypertension Continue current regimen  Anemia of chronic kidney disease Hemoglobin stable  History of NSCLC Status post right upper lobectomy and radiation  GERD Continue Protonix         DVT prophylaxis: Heparin [place] Code Status: Full Family Communication: None present at the bedside Disposition Plan: Undetermined at this point.  Nephrology clearance needed for discharge   Consultants: Nephrology  Procedures: Dialysis  Antimicrobials:  Anti-infectives (From admission, onward)   None      Subjective:  Patient seen and examined the bedside this morning.  Hemodynamically stable.  Remains comfortable.  Currently on 4 L of oxygen per minute.  Denies any shortness of breath or chest pain.  Objective: Vitals:   10/11/18 0025 10/11/18 0527 10/11/18 0529 10/11/18 0804  BP:   138/81   Pulse:      Resp:   (!) 23   Temp: 97.6 F (36.4 C) 97.7 F (36.5 C) 97.6 F (36.4 C)   TempSrc:  Oral Oral   SpO2:    97%  Weight:   93 kg   Height:         Intake/Output Summary (Last 24 hours) at 10/11/2018 1131 Last data filed at 10/11/2018 0200 Gross per 24 hour  Intake 324.04 ml  Output -  Net 324.04 ml   Filed Weights   10/09/18 1454 10/10/18 0435 10/11/18 0529  Weight: 90.6 kg 92.4 kg 93 kg    Examination:   General exam: Appears calm and comfortable ,Not in distress,obese HEENT:PERRL,Oral mucosa moist, Ear/Nose normal on gross exam, temporary dialysis catheter on the right neck Respiratory system: Decreased air entry but no wheezes or crackles. Cardiovascular system: S1 & S2 heard, RRR. No JVD, murmurs, rubs, gallops or clicks. Gastrointestinal system: Abdomen is nondistended, soft and nontender. No organomegaly or masses felt. Normal bowel sounds heard. Central nervous system: Alert and oriented. No focal neurological deficits. Extremities:  Trace bilateral lower extremity edema, no clubbing ,no cyanosis, chronic skin changes with dryness and segmentation Skin: No rashes, lesions or ulcers,no icterus ,no pallor MSK: Normal muscle bulk,tone ,power Psychiatry: Judgement and insight appear normal. Mood & affect appropriate.     Data Reviewed: I have personally reviewed following labs and imaging studies  CBC: Recent Labs  Lab 10/04/18 1320 10/05/18 1255 10/06/18 0450 10/06/18 1536 10/07/18 0309 10/09/18 1049 10/11/18 0935  WBC 8.4 8.2 7.2 8.6 8.9 8.2 9.5  NEUTROABS 6.3 7.8* 5.8  --  7.3 6.8  --   HGB 11.4* 11.2* 10.7* 11.6* 11.2* 11.0* 10.5*  HCT 36.9* 34.5* 33.6* 36.7* 34.3* 34.6* 32.5*  MCV 77.8* 76.3* 76.4* 77.1* 76.4* 78.1* 76.5*  PLT 188 194 204 239 192 195 956   Basic Metabolic Panel: Recent Labs  Lab 10/04/18 1628  10/06/18 0450 10/07/18 0309 10/09/18 1049 10/10/18 1710 10/11/18 0320  NA  --    < > 143 141 142 137 137  K  --    < > 3.3* 3.5 3.3* 4.3 4.8  CL  --    < > 106 102 104 101 105  CO2  --    < > 25 24 26 26  21*  GLUCOSE  --    < > 98 90 121* 142* 127*  BUN  --    < > 92* 70* 67* 54* 64*   CREATININE  --    < > 5.20* 4.19* 4.38* 3.78* 4.18*  CALCIUM  --    < > 8.5* 8.2* 8.1* 8.2* 8.1*  MG 2.0  --   --   --   --   --   --   PHOS 4.5  --  5.4*  --   --   --   --    < > = values in this interval not displayed.   GFR: Estimated Creatinine Clearance: 16.4 mL/min (A) (by C-G formula based on SCr of 4.18 mg/dL (H)). Liver Function Tests: Recent Labs  Lab 10/05/18 1255 10/06/18 0450  AST 25  --   ALT 25  --   ALKPHOS 89  --   BILITOT 0.7  --   PROT 5.6*  --   ALBUMIN 2.2* 2.0*   No results for input(s): LIPASE, AMYLASE in the last 168 hours. No results for input(s): AMMONIA in the last 168 hours. Coagulation Profile: Recent Labs  Lab 10/04/18 1320 10/11/18 0935  INR 1.1 1.1   Cardiac Enzymes: No results for input(s): CKTOTAL, CKMB, CKMBINDEX, TROPONINI in the last 168 hours. BNP (last 3 results) No results for input(s): PROBNP in the last 8760 hours. HbA1C:  No results for input(s): HGBA1C in the last 72 hours. CBG: Recent Labs  Lab 10/07/18 0753 10/08/18 0756 10/09/18 0758 10/10/18 0753 10/11/18 0804  GLUCAP 94 89 72 129* 126*   Lipid Profile: No results for input(s): CHOL, HDL, LDLCALC, TRIG, CHOLHDL, LDLDIRECT in the last 72 hours. Thyroid Function Tests: No results for input(s): TSH, T4TOTAL, FREET4, T3FREE, THYROIDAB in the last 72 hours. Anemia Panel: No results for input(s): VITAMINB12, FOLATE, FERRITIN, TIBC, IRON, RETICCTPCT in the last 72 hours. Sepsis Labs: No results for input(s): PROCALCITON, LATICACIDVEN in the last 168 hours.  Recent Results (from the past 240 hour(s))  SARS CORONAVIRUS 2 (TAT 6-24 HRS) Nasopharyngeal Nasopharyngeal Swab     Status: None   Collection Time: 10/04/18  4:01 PM   Specimen: Nasopharyngeal Swab  Result Value Ref Range Status   SARS Coronavirus 2 NEGATIVE NEGATIVE Final    Comment: (NOTE) SARS-CoV-2 target nucleic acids are NOT DETECTED. The SARS-CoV-2 RNA is generally detectable in upper and lower  respiratory specimens during the acute phase of infection. Negative results do not preclude SARS-CoV-2 infection, do not rule out co-infections with other pathogens, and should not be used as the sole basis for treatment or other patient management decisions. Negative results must be combined with clinical observations, patient history, and epidemiological information. The expected result is Negative. Fact Sheet for Patients: SugarRoll.be Fact Sheet for Healthcare Providers: https://www.woods-mathews.com/ This test is not yet approved or cleared by the [place] FDA and  has been authorized for detection and/or diagnosis of SARS-CoV-2 by FDA under an Emergency Use Authorization (EUA). This EUA will remain  in effect (meaning this test can be used) for the duration of the COVID-19 declaration under Section 56 4(b)(1) of the Act, 21 U.S.C. section 360bbb-3(b)(1), unless the authorization is terminated or revoked sooner. Performed at [facility], [place] [address], [place], [place] [address]   MRSA PCR Screening     Status: None   Collection Time: 10/06/18  8:45 PM   Specimen: Nasopharyngeal  Result Value Ref Range Status   MRSA by PCR NEGATIVE NEGATIVE Final    Comment:        The GeneXpert MRSA Assay (FDA approved for NASAL specimens only), is one component of a comprehensive MRSA colonization surveillance program. It is not intended to diagnose MRSA infection nor to guide or monitor treatment for MRSA infections. Performed at [facility], [place] [address], [place], [place] [address]          Radiology Studies: No results found.      Scheduled Meds: . amLODipine  5 mg Oral Daily  . aspirin EC  81 mg Oral Daily  . carvedilol  3.125 mg Oral BID WC  . Chlorhexidine Gluconate Cloth  6 each Topical Q0600  . cholecalciferol  1,000 Units Oral Daily  . heparin injection (subcutaneous)  5,000 Units Subcutaneous Q8H   . hydrALAZINE  25 mg Oral Q8H  . influenza vaccine adjuvanted  0.5 mL Intramuscular Tomorrow-1000  . pantoprazole  40 mg Oral BID   Continuous Infusions: . sodium chloride    . sodium chloride    . sodium chloride 10 mL/hr at 10/09/18 1027  . furosemide 160 mg (10/11/18 0600)     LOS: 5 days    Time spent: 35 mins.More than 50% of that time was spent in counseling and/or coordination of care.      Shelly Coss, MD [facility] Pager [phone]  If 7PM-7AM, please contact night-coverage [email or web address]  Password [email or web address] 10/11/2018, 11:31 AM

## 2018-10-11 NOTE — Progress Notes (Signed)
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  GOAL:   Patient will meet greater than or equal to 90% of their needs  MONITOR:   PO intake, Supplement acceptance, Labs, Weight trends, Skin, I & O's  REASON FOR ASSESSMENT:   Malnutrition Screening Tool    ASSESSMENT:   Andrew Guerra is a 78 y.o. 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  He was asked by his PCP on Friday to go to the emergency department due to worsening kidney function.  Patient reports that he came today because of exertional shortness of breath, worsening leg swelling.  Denies chest pain, palpitation, fever, chills, cough, congestion, nausea, vomiting, abdominal pain, hematemesis, melena, lightheadedness, dizziness, orthopnea, PND, sleep or appetite changes.  He is compliant with his medications and denies smoking, alcohol, illicit drug use.  Pt admitted with exertional shortness of breath and leg swelling.   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  Plan to HD cath placement with IR. CSW has been consulted for CLIP process.    Attempted to speak with pt x 2 today. Pt sleeping soundly in room at first visit. Pt down for procedure at second visit. Due to pt being on contact precautions, RD did not enter pt room in an effort to preserve PPE.   Pt with fair appetite; noted meal completion 50-75%.   Reviewed wt hx, which reveals wt stability. However, pt with moderate edema and fluid overload. Suspect edema may be masking further weight loss and fat and muscle depletions.   Labs reviewed: CBGS: 126- 129.   Diet Order:   Diet Order            Diet NPO time specified Except for: Sips with Meds  Diet effective now              EDUCATION NEEDS:   No education needs have been identified at this time  Skin:  Skin Assessment: Reviewed RN Assessment  Last BM:  10/09/18  Height:   Ht Readings from Last 1 Encounters:  10/07/18 5\' 10"  (1.778 m)    Weight:   Wt Readings from Last 1 Encounters:  10/11/18 93 kg    Ideal Body Weight:  75.5 kg  BMI:  Body mass index is 29.42 kg/m.  Estimated Nutritional Needs:   Kcal:  2050-2250  Protein:  100-115 grams  Fluid:  1000 ml + UOP    Jasmane Brockway A. Jimmye Norman, RD, LDN, [place] Registered Dietitian II Certified Diabetes Care and Education Specialist Pager: [phone] After hours Pager: [phone]

## 2018-10-11 NOTE — Progress Notes (Signed)
Chief Complaint: Patient was seen in consultation today for renal failure  Referring Physician(s): Dr. Brigitte Pulse  Supervising Physician: Sandi Mariscal  Patient Status: [facility] - In-pt  History of Present Illness: Andrew Guerra is a 78 y.o. male with past medical history of HTN, HLD, NSCLC s/p resection and radiation with respiratory failure on 2L , CKD who presented to [facility] ED with leg swelling and shortness of breath.  Patient underwent temp catheter placement 9/18 now with need for tunneled dialysis catheter placement.   Patient has been NPO.  Heparin  Held this AM.   Past Medical History:  Diagnosis Date   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 02/08/2017   moderate by echo 10/2017 with mean AVG 37mmHg and dimensionless index 0.31 consistent with moderate AS.   Cataract    right eye - surgery to remove   Elevated PSA 11/29/2016   Patient is followed by [facility].  Most recent PSA near 11.  He has had atypia on biopsy.  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[place])    no problems per patient    Past Surgical History:  Procedure Laterality Date   BIOPSY  11/11/2017   Procedure: BIOPSY;  Surgeon: Lavena Bullion, DO;  Location: [place] ENDOSCOPY;  Service: Gastroenterology;;   BIOPSY  01/10/2018   Procedure: BIOPSY;  Surgeon: [name], MD;  Location: [place];  Service: Gastroenterology;;   BIOPSY  06/22/2018   Procedure: BIOPSY;  Surgeon: Yetta Flock, MD;  Location: [facility] ENDOSCOPY;  Service: Gastroenterology;;   cataract eye surgery Right    COLONOSCOPY  11/2009   hx polyps/[place]   COLONOSCOPY N/A 06/22/2018   Procedure: COLONOSCOPY;  Surgeon:  Yetta Flock, MD;  Location: [place];  Service: Gastroenterology;  Laterality: N/A;   ENTEROSCOPY N/A 06/22/2018   Procedure: PUSH ENTEROSCOPY;  Surgeon: Yetta Flock, MD;  Location: [facility] ENDOSCOPY;  Service: Gastroenterology;  Laterality: N/A;   ESOPHAGOGASTRODUODENOSCOPY N/A 11/11/2017   Procedure: ESOPHAGOGASTRODUODENOSCOPY (EGD);  Surgeon: Lavena Bullion, DO;  Location: [facility] ENDOSCOPY;  Service: Gastroenterology;  Laterality: N/A;   ESOPHAGOGASTRODUODENOSCOPY (EGD) WITH PROPOFOL N/A 01/10/2018   Procedure: ESOPHAGOGASTRODUODENOSCOPY (EGD) WITH PROPOFOL;  Surgeon: [name], MD;  Location: [place];  Service: Gastroenterology;  Laterality: N/A;   HOT HEMOSTASIS N/A 06/22/2018   Procedure: HOT HEMOSTASIS (ARGON PLASMA COAGULATION/BICAP);  Surgeon: Yetta Flock, MD;  Location: [facility] ENDOSCOPY;  Service: Gastroenterology;  Laterality: N/A;   IR FLUORO GUIDE CV LINE RIGHT  01/16/2018   IR FLUORO GUIDE CV LINE RIGHT  01/31/2018   IR FLUORO GUIDE CV LINE RIGHT  10/06/2018   IR US GUIDE VASC ACCESS RIGHT  01/16/2018   IR US GUIDE VASC ACCESS RIGHT  01/31/2018   IR US GUIDE VASC ACCESS RIGHT  10/06/2018   POLYPECTOMY  06/22/2018   Procedure: POLYPECTOMY;  Surgeon: Yetta Flock, MD;  Location: [facility] ENDOSCOPY;  Service: Gastroenterology;;   PROSTATE BIOPSY     right eye surgery     to lower eye pressure   RIGHT HEART CATH N/A 01/19/2018   Procedure: RIGHT HEART CATH;  Surgeon: Nelva Bush, MD;  Location: [place] CV LAB;  Service: Cardiovascular;  Laterality: N/A;   TONSILLECTOMY     VIDEO ASSISTED THORACOSCOPY (VATS)/ LOBECTOMY Right 12/26/2015   wisdom tteeth ext      Allergies: Ambien [zolpidem tartrate] and Atorvastatin  Medications: Prior to Admission medications   Medication Sig Start Date End Date Taking? Authorizing Provider  amLODipine (NORVASC) 2.5 MG tablet TAKE 1 TABLET BY MOUTH EVERY DAY Patient taking differently: Take  2.5 mg by mouth daily.  09/26/18  Yes Luking, Elayne Snare, MD  Brinzolamide-Brimonidine (SIMBRINZA) 1-0.2 % SUSP Place 2-3 drops into both eyes 3 (three) times daily.    Yes [provider]  cholecalciferol (VITAMIN D3) 25 MCG (1000 UT) tablet Take 1,000 Units by mouth daily.   Yes [provider]  Multiple Vitamin (MULTIVITAMIN) tablet Take 1 tablet by mouth daily. With vitamin D   Yes [provider]  Netarsudil-Latanoprost (ROCKLATAN) 0.02-0.005 % SOLN Place 1 drop into both eyes at bedtime.    Yes Marylynn Pearson, MD  Nutritional Supplements (FEEDING SUPPLEMENT, NEPRO CARB STEADY,) LIQD Take 237 mLs by mouth 2 (two) times daily.   Yes [provider]  pantoprazole (PROTONIX) 40 MG tablet TAKE 1 TABLET BY MOUTH TWICE A DAY Patient taking differently: Take 40 mg by mouth 2 (two) times daily.  09/24/18  Yes [name], MD  torsemide (DEMADEX) 100 MG tablet Take 1 tablet (100 mg total) by mouth daily. Patient taking differently: Take 100 mg by mouth 2 (two) times daily.  07/12/18  Yes Luking, Elayne Snare, MD  XIIDRA 5 % SOLN Place 1 drop into both eyes 2 (two) times daily as needed (Dry eye).  03/27/18  Yes [provider]     Family History  Problem Relation Age of Onset   Diabetes Mother    Cerebral aneurysm Mother    Lung cancer Father    Diabetes Sister    Colon cancer Neg Hx    Colon polyps Neg Hx    Esophageal cancer Neg Hx    Rectal cancer Neg Hx    Stomach cancer Neg Hx     Social History   Socioeconomic History   Marital status: Married    Spouse name: Not on file   Number of children: 2   Years of education: Not on file   Highest education level: Not on file  Occupational History   Occupation: retired  Scientist, product/process development strain: Not on file   Food insecurity    Worry: Not on file    Inability: Not on file   Transportation needs    Medical: Not on file    Non-medical: Not on file  Tobacco Use    Smoking status: Former Smoker    Packs/day: 1.00    Types: Cigarettes    Quit date: 01/19/2011    Years since quitting: 7.7   Smokeless tobacco: Never Used  Substance and Sexual Activity   Alcohol use: Yes    Alcohol/week: 1.0 standard drinks    Types: 1 Shots of liquor per week    Comment: rarely   Drug use: No   Sexual activity: Not on file  Lifestyle   Physical activity    Days per week: Not on file    Minutes per session: Not on file   Stress: Not on file  Relationships   Social connections    Talks on phone: Not on file    Gets together: Not on file    Attends religious service: Not on file  Active member of club or organization: Not on file    Attends meetings of clubs or organizations: Not on file    Relationship status: Not on file  Other Topics Concern   Not on file  Social History Narrative   Not on file     Review of Systems: A 12 point ROS discussed and pertinent positives are indicated in the HPI above.  All other systems are negative.  Review of Systems  Constitutional: Negative for fatigue and fever.  Respiratory: Negative for cough and shortness of breath.   Cardiovascular: Negative for chest pain.  Gastrointestinal: Negative for abdominal pain.  Genitourinary: Negative for dysuria.  Musculoskeletal: Negative for back pain.  Psychiatric/Behavioral: Negative for behavioral problems and confusion.    Vital Signs: BP (!) 145/74 (BP Location: Right Arm)    Pulse 79    Temp 97.6 F (36.4 C) (Oral)    Resp 20    Ht 5\' 10"  (1.778 m)    Wt 205 lb 0.4 oz (93 kg)    SpO2 93%    BMI 29.42 kg/m   Physical Exam Vitals signs and nursing note reviewed.  Neck:     Musculoskeletal: Normal range of motion.     Comments: Temp cath in place on R Cardiovascular:     Rate and Rhythm: Normal rate and regular rhythm.  Pulmonary:     Effort: Pulmonary effort is normal.     Breath sounds: Normal breath sounds. No decreased breath sounds.  Skin:    General:  Skin is warm and dry.  Neurological:     General: No focal deficit present.     Mental Status: He is alert.  Psychiatric:        Mood and Affect: Mood normal.        Behavior: Behavior normal.      MD Evaluation Airway: WNL Heart: WNL Abdomen: WNL Chest/ Lungs: WNL ASA  Classification: 3 Mallampati/Airway Score: One   Imaging: Dg Chest 2 View  Result Date: 10/04/2018 CLINICAL DATA:  [age]-year-old male with shortness of breath. EXAM: CHEST - 2 VIEW COMPARISON:  Chest radiograph dated 02/03/2018 FINDINGS: Interval removal of the right-sided dialysis catheter. Small bilateral pleural effusions with bibasilar atelectasis or infiltrate. No pneumothorax. Stable cardiac silhouette. No acute osseous pathology. IMPRESSION: Small bilateral pleural effusions with bibasilar atelectasis or infiltrate. Electronically Signed   By: Anner Crete M.D.   On: 10/04/2018 13:15   US Renal  Result Date: 10/04/2018 CLINICAL DATA:  Acute on chronic renal failure EXAM: RENAL / URINARY TRACT ULTRASOUND COMPLETE COMPARISON:  01/13/2018, CT 01/09/2018 FINDINGS: Right Kidney: Renal measurements: 9.9 x 4.6 x 6.6 cm = volume: 156 mL. Increased cortical echogenicity. No hydronephrosis. Multiple, fewer than 10 cysts within the right kidney. Upper pole cyst measures 1.8 by 1.6 x 1.5 cm. Midpole cyst measures 0.7 x 1 by 0.6 cm. Lower pole septated cyst measures 1.4 by 1.7 x 1.3 cm. Left Kidney: Renal measurements: 11.2 x 6.4 x 5.4 cm = volume: 203 mL. Increased cortical echogenicity without hydronephrosis. Multiple, fewer than 10 cysts within each kidney. Upper pole cyst measures 1.8 x 1.9 by 1.4 cm. Midpole cyst measures 2.4 x 1.5 by 1.9 cm. Lower pole cyst measures 1.6 by 1.3 x 1.1 cm. Bladder: Appears normal for degree of bladder distention. Enlarged prostate measuring 5.3 x 3.9 x 4.5 cm. Small amount of free fluid in the right upper quadrant. Incidental note made of bilateral pleural effusions. IMPRESSION: 1.  Echogenic kidneys consistent with  medical renal disease. No hydronephrosis. Bilateral renal cysts, fewer than 10 within each kidney. 2. Small amount of right upper quadrant ascites 3. Bilateral pleural effusions 4. Prostatomegaly Electronically Signed   By: Donavan Foil M.D.   On: 10/04/2018 18:01   Ir Fluoro Guide Cv Line Right  Result Date: 10/06/2018 INDICATION: 78 year old male with acute need for hemodialysis. He presents for placement of a temporary hemodialysis catheter. EXAM: IR ULTRASOUND GUIDANCE VASC ACCESS RIGHT; IR RIGHT FLUORO GUIDE CV LINE MEDICATIONS: None ANESTHESIA/SEDATION: None FLUOROSCOPY TIME:  Fluoroscopy Time: 0 minutes 6 seconds (1 mGy). COMPLICATIONS: None immediate. PROCEDURE: Informed written consent was obtained from the patient after a thorough discussion of the procedural risks, benefits and alternatives. All questions were addressed. Maximal Sterile Barrier Technique was utilized including caps, mask, sterile gowns, sterile gloves, sterile drape, hand hygiene and skin antiseptic. A timeout was performed prior to the initiation of the procedure. The right internal jugular vein was interrogated with ultrasound and found to be widely patent. An image was obtained and stored for the medical record. Local anesthesia was attained by infiltration with 1% lidocaine. A small dermatotomy was made. Under real-time sonographic guidance, the vessel was punctured with a 21 gauge micropuncture needle. Using standard technique, the initial micro needle was exchanged over a 0.018 micro wire for a transitional 4 [place] micro sheath. The micro sheath was then exchanged over a 0.035 wire for a fascial dilator. The soft tissue tract was dilated. Next, a 16 cm non tunneled temporary hemodialysis catheter was advanced over the wire and positioned with the tip at the cavoatrial junction. The catheter flushes and aspirates with ease. An image was obtained and stored for the medical record. The catheter  was flushed with heparinized saline and capped before being secured to the skin with 0 Prolene suture. A sterile bandage was applied. IMPRESSION: Successful placement of a right IJ approach non tunneled hemodialysis catheter. The catheter tip is at the cavoatrial junction and is ready for immediate use. Electronically Signed   By: Jacqulynn Cadet M.D.   On: 10/06/2018 17:11   Ir US Guide Vasc Access Right  Result Date: 10/06/2018 INDICATION: 78 year old male with acute need for hemodialysis. He presents for placement of a temporary hemodialysis catheter. EXAM: IR ULTRASOUND GUIDANCE VASC ACCESS RIGHT; IR RIGHT FLUORO GUIDE CV LINE MEDICATIONS: None ANESTHESIA/SEDATION: None FLUOROSCOPY TIME:  Fluoroscopy Time: 0 minutes 6 seconds (1 mGy). COMPLICATIONS: None immediate. PROCEDURE: Informed written consent was obtained from the patient after a thorough discussion of the procedural risks, benefits and alternatives. All questions were addressed. Maximal Sterile Barrier Technique was utilized including caps, mask, sterile gowns, sterile gloves, sterile drape, hand hygiene and skin antiseptic. A timeout was performed prior to the initiation of the procedure. The right internal jugular vein was interrogated with ultrasound and found to be widely patent. An image was obtained and stored for the medical record. Local anesthesia was attained by infiltration with 1% lidocaine. A small dermatotomy was made. Under real-time sonographic guidance, the vessel was punctured with a 21 gauge micropuncture needle. Using standard technique, the initial micro needle was exchanged over a 0.018 micro wire for a transitional 4 [place] micro sheath. The micro sheath was then exchanged over a 0.035 wire for a fascial dilator. The soft tissue tract was dilated. Next, a 16 cm non tunneled temporary hemodialysis catheter was advanced over the wire and positioned with the tip at the cavoatrial junction. The catheter flushes and aspirates  with ease. An image was obtained  and stored for the medical record. The catheter was flushed with heparinized saline and capped before being secured to the skin with 0 Prolene suture. A sterile bandage was applied. IMPRESSION: Successful placement of a right IJ approach non tunneled hemodialysis catheter. The catheter tip is at the cavoatrial junction and is ready for immediate use. Electronically Signed   By: Jacqulynn Cadet M.D.   On: 10/06/2018 17:11    Labs:  CBC: Recent Labs    10/06/18 1536 10/07/18 0309 10/09/18 1049 10/11/18 0935  WBC 8.6 8.9 8.2 9.5  HGB 11.6* 11.2* 11.0* 10.5*  HCT 36.7* 34.3* 34.6* 32.5*  PLT 239 192 195 179    COAGS: Recent Labs    01/25/18 0346 05/15/18 1026 10/04/18 1320 10/11/18 0935  INR 1.23 1.1 1.1 1.1    BMP: Recent Labs    10/07/18 0309 10/09/18 1049 10/10/18 1710 10/11/18 0320  NA 141 142 137 137  K 3.5 3.3* 4.3 4.8  CL 102 104 101 105  CO2 24 26 26  21*  GLUCOSE 90 121* 142* 127*  BUN 70* 67* 54* 64*  CALCIUM 8.2* 8.1* 8.2* 8.1*  CREATININE 4.19* 4.38* 3.78* 4.18*  GFRNONAA 13* 12* 14* 13*  GFRAA 15* 14* 17* 15*    LIVER FUNCTION TESTS: Recent Labs    01/16/18 0456  01/21/18 0509  05/01/18 1010  08/21/18 0848 10/02/18 0830 10/05/18 1255 10/06/18 0450  BILITOT 0.4  --  <0.1*  --  0.4  --   --   --  0.7  --   AST 26  --  24  --  25  --   --   --  25  --   ALT 36  --  30  --  17  --   --   --  25  --   ALKPHOS 78  --  74  --  88  --   --   --  89  --   PROT 4.7*  --  4.6*  --  5.6*  --   --   --  5.6*  --   ALBUMIN 1.9*   < > 1.6*   < > 2.1*   < > 2.3* 2.2* 2.2* 2.0*   < > = values in this interval not displayed.    TUMOR MARKERS: No results for input(s): AFPTM, CEA, CA199, CHROMGRNA in the last 8760 hours.  Assessment and Plan: Renal failure S/p temp cath placement 9/18 Now request made for tunneled dialysis catheter placement.  He has been NPO.  Heparin held after 5AM dose.  Proceed with placement today.    Risks and benefits discussed with the patient including, but not limited to bleeding, infection, vascular injury, pneumothorax which may require chest tube placement, air embolism or even death  All of the patient's questions were answered, patient is agreeable to proceed. Consent signed and in chart.  Thank you for this interesting consult.  I greatly enjoyed meeting ABDULKAREEM BADOLATO and look forward to participating in their care.  A copy of this report was sent to the requesting provider on this date.  Electronically Signed: Docia Barrier, PA 10/11/2018, 3:58 PM   I spent a total of 20 Minutes    in face to face in clinical consultation, greater than 50% of which was counseling/coordinating care for renal failure.

## 2018-10-11 NOTE — Progress Notes (Signed)
Renal Navigator received notification from Dr. Lin/Nephrologist to complete OP HD referral for AKI. Renal Navigator met with patient to complete assessment and completed referral to [facility]missions. Renal Navigator requests seat schedule at [facility] based on patient's preference.  Renal Navigator will continue to follow closely until schedule is obtained.  Alphonzo Cruise, [place] Renal Navigator [phone]

## 2018-10-11 NOTE — Procedures (Signed)
Pre-procedure Diagnosis: ESRD Post-procedure Diagnosis: Same  Successful fluoro guided conversion of non-tunneled to tunneled HD catheter with tips terminating within the superior aspect of the right atrium.    Complications: None Immediate EBL: Minimal   The catheter is ready for immediate use.   Ronny Bacon, MD Pager #: [phone]

## 2018-10-11 NOTE — Care Management Important Message (Signed)
Important Message  Patient Details  Name: Andrew Guerra MRN: [id number] Date of Birth: [birth year]   Medicare Important Message Given:  Yes     Memory Argue 10/11/2018, 2:56 PM

## 2018-10-12 LAB — BASIC METABOLIC PANEL
Anion gap: 11 (ref 5–15)
BUN: 79 mg/dL — ABNORMAL HIGH (ref 8–23)
CO2: 25 mmol/L (ref 22–32)
Calcium: 8.2 mg/dL — ABNORMAL LOW (ref 8.9–10.3)
Chloride: 101 mmol/L (ref 98–111)
Creatinine, Ser: 4.65 mg/dL — ABNORMAL HIGH (ref 0.61–1.24)
GFR calc Af Amer: 13 mL/min — ABNORMAL LOW (ref >60)
GFR calc non Af Amer: 11 mL/min — ABNORMAL LOW (ref >60)
Glucose, Bld: 148 mg/dL — ABNORMAL HIGH (ref 70–99)
Potassium: 4.4 mmol/L (ref 3.5–5.1)
Sodium: 137 mmol/L (ref 135–145)

## 2018-10-12 LAB — CREATININE, URINE, 24 HOUR
Collection Interval-UCRE24: 24 hours
Creatinine, 24H Ur: 580 mg/d — ABNORMAL LOW (ref 800–2000)
Creatinine, Urine: 116.04 mg/dL
Urine Total Volume-UCRE24: 500 mL

## 2018-10-12 LAB — GLUCOSE, CAPILLARY: Glucose-Capillary: 107 mg/dL — ABNORMAL HIGH (ref 70–99)

## 2018-10-12 MED ORDER — PREDNISONE 50 MG PO TABS
60.0000 mg | ORAL_TABLET | Freq: Every day | ORAL | Status: DC
  Administered 2018-10-12 – 2018-10-13 (×2): 60 mg via ORAL
  Filled 2018-10-12 (×2): qty 1

## 2018-10-12 NOTE — Progress Notes (Signed)
PROGRESS NOTE    Andrew Guerra  XHB:[id number] DOB: October 28, 1940 DOA: 10/04/2018 PCP: [name], MD   Brief Narrative:  Andrew Guerra a 78 y.o.malewith medical history significant ofhypertension, hyperlipidemia, bicuspid aortic valve with moderateaortic stenosis,NSCLCstatus post resection and radiation, PUD, chronic hypoxic respiratory failure on 2 L of oxygen via nasal cannulae at home,chronic kidney disease, chronic diastolic congestive heart failure with preserved ejection fractionpresents to emergency department with complaint of worsening leg swelling and SOB. He was asked by his PCP on Friday to go to the emergency department due to worsening kidney function. Patient reported exertional shortness of breath, worsening leg swelling. In the ED, BNP was elevated more than 3000, troponin is elevated at 108-->93. Found to have worsening renal function.  Nephrology consulted.  Started on dialysis.  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 Posey Pronto as an outpatient. Renal ultrasound showed echogenic kidneys consistent with medical renal disease, no hydronephrosis, ascites, bilateral pleural effusions. Nephrology consulted, not responding to high-dose Lasix.Underwent right neck jg temp HD cath placed on 10/06/2018. Patient underwent third  hemodialysis session on 10/09/18. Supspected to have  PLA-2R positive membranous nephropathy, nephrotic  syndrome.Nephrology started on solumedrol which has been changed to prednisone.Plan to continue steroid in tapering dose. Plan for HD tomorrow.Underwent tunneled catheter placment on 10/13/18.   Acute on chronic hypoxic respiratory failure/acute on chronic diastolic HF/moderate aortic stenosis: Multifactorial, fluid overload/anasarca, history of lung cancer ABG showed hypoxia proBNP elevated Troponin slightly elevated, trended down, EKG with no acute ST changes Chest x-ray showed bilateral pleural effusion Echo showed EF of 55 to 60%, right ventricular pressure overload, basal/mid inferior wall and basal/mid inferolateral wall abnormality, pleural effusion Currently  On  IV Lasix Respiratory symptoms are likely secondary to pulmonary edema that improved with hemodialysis.  Patient will benefit from cardiology follow-up on discharge for valvular heart disease (aortic valve).  Today he is on 4 L of oxygen per minute.  On baseline he is on 2 L of oxygen for chronic respiratory insufficiency from CHF.Anticipate improvement in the oxygen requirement with dialysis.  Hypertension Continue current regimen  Anemia of chronic kidney disease Hemoglobin stable  History of NSCLC Status post right upper lobectomy and radiation  GERD Continue Protonix  Nutrition Problem: Increased nutrient needs Etiology: chronic illness(ESRD on HD)      DVT prophylaxis: Heparin [place] Code Status: Full Family Communication: None present at the bedside Disposition Plan: Home as per nephrology.Outpatient dialysis place/schedule on process.  Consultants: Nephrology  Procedures: Dialysis  Antimicrobials:  Anti-infectives (From admission, onward)   Start     Dose/Rate Route Frequency Ordered Stop   10/11/18 1615  ceFAZolin (ANCEF) IVPB 2g/100 mL premix     2 g 200 mL/hr over 30 Minutes Intravenous  Once 10/11/18 1604 10/11/18 1652   10/11/18 1600  ceFAZolin (ANCEF) IVPB 1 g/50 mL premix  Status:   Discontinued     1 g 100 mL/hr over 30 Minutes Intravenous  Once 10/11/18 1549 10/11/18 1602  Subjective:  Patient seen and examined the bedside this morning.  Hemodynamically stable.  Denies any complaints.  Comfortable.  Objective: Vitals:   10/12/18 0002 10/12/18 0403 10/12/18 0415 10/12/18 0756  BP:  (!) 146/90  (!) 157/80  Pulse:  78  75  Resp:  19  11  Temp: 97.6 F (36.4 C)  97.6 F (36.4 C) (!) 97.5 F (36.4 C)  TempSrc: Oral   Oral  SpO2:  91%  90%  Weight:      Height:        Intake/Output Summary (Last 24 hours) at 10/12/2018 1128 Last data filed at 10/12/2018 0235 Gross per 24 hour  Intake 875.45 ml  Output 55 ml  Net 820.45 ml   Filed Weights   10/09/18 1454 10/10/18 0435 10/11/18 0529  Weight: 90.6 kg 92.4 kg 93 kg    Examination:  General exam: Appears calm and comfortable ,Not in distress,average built HEENT:PERRL,Oral mucosa moist, Ear/Nose normal on gross exam Respiratory system: Bilateral decreased air entry on the bases Cardiovascular system: S1 & S2 heard, RRR. No JVD, murmurs, rubs, gallops or clicks.  Tunneled dialysis catheter on the right chest gastrointestinal system: Abdomen is nondistended, soft and nontender. No organomegaly or masses felt. Normal bowel sounds heard. Central nervous system: Alert and oriented. No focal neurological deficits. Extremities: No edema, no clubbing ,no cyanosis, distal peripheral pulses palpable. Skin: Trace bilateral lower extremity edema, no clubbing ,no cyanosis, chronic skin changes with dryness and segmentation    Data Reviewed: I have personally reviewed following labs and imaging studies  CBC: Recent Labs  Lab 10/05/18 1255 10/06/18 0450 10/06/18 1536 10/07/18 0309 10/09/18 1049 10/11/18 0935  WBC 8.2 7.2 8.6 8.9 8.2 9.5  NEUTROABS 7.8* 5.8  --  7.3 6.8  --   HGB 11.2* 10.7* 11.6* 11.2* 11.0* 10.5*  HCT 34.5* 33.6* 36.7* 34.3* 34.6* 32.5*  MCV 76.3* 76.4* 77.1* 76.4* 78.1* 76.5*  PLT  194 204 239 192 195 188   Basic Metabolic Panel: Recent Labs  Lab 10/06/18 0450 10/07/18 0309 10/09/18 1049 10/10/18 1710 10/11/18 0320 10/12/18 0226  NA 143 141 142 137 137 137  K 3.3* 3.5 3.3* 4.3 4.8 4.4  CL 106 102 104 101 105 101  CO2 25 24 26 26  21* 25  GLUCOSE 98 90 121* 142* 127* 148*  BUN 92* 70* 67* 54* 64* 79*  CREATININE 5.20* 4.19* 4.38* 3.78* 4.18* 4.65*  CALCIUM 8.5* 8.2* 8.1* 8.2* 8.1* 8.2*  PHOS 5.4*  --   --   --   --   --    GFR: Estimated Creatinine Clearance: 14.8 mL/min (A) (by C-G formula based on SCr of 4.65 mg/dL (H)). Liver Function Tests: Recent Labs  Lab 10/05/18 1255 10/06/18 0450  AST 25  --   ALT 25  --   ALKPHOS 89  --   BILITOT 0.7  --   PROT 5.6*  --   ALBUMIN 2.2* 2.0*   No results for input(s): LIPASE, AMYLASE in the last 168 hours. No results for input(s): AMMONIA in the last 168 hours. Coagulation Profile: Recent Labs  Lab 10/11/18 0935  INR 1.1   Cardiac Enzymes: No results for input(s): CKTOTAL, CKMB, CKMBINDEX, TROPONINI in the last 168 hours. BNP (last 3 results) No results for input(s): PROBNP in the last 8760 hours. HbA1C: No results for input(s): HGBA1C in the last 72 hours. CBG: Recent Labs  Lab 10/08/18 0756 10/09/18 0758 10/10/18 0753 10/11/18 0804 10/12/18 0753  GLUCAP 89  72 129* 126* 107*   Lipid Profile: No results for input(s): CHOL, HDL, LDLCALC, TRIG, CHOLHDL, LDLDIRECT in the last 72 hours. Thyroid Function Tests: No results for input(s): TSH, T4TOTAL, FREET4, T3FREE, THYROIDAB in the last 72 hours. Anemia Panel: No results for input(s): VITAMINB12, FOLATE, FERRITIN, TIBC, IRON, RETICCTPCT in the last 72 hours. Sepsis Labs: No results for input(s): PROCALCITON, LATICACIDVEN in the last 168 hours.  Recent Results (from the past 240 hour(s))  SARS CORONAVIRUS 2 (TAT 6-24 HRS) Nasopharyngeal Nasopharyngeal Swab     Status: None   Collection Time: 10/04/18  4:01 PM   Specimen: Nasopharyngeal Swab   Result Value Ref Range Status   SARS Coronavirus 2 NEGATIVE NEGATIVE Final    Comment: (NOTE) SARS-CoV-2 target nucleic acids are NOT DETECTED. The SARS-CoV-2 RNA is generally detectable in upper and lower respiratory specimens during the acute phase of infection. Negative results do not preclude SARS-CoV-2 infection, do not rule out co-infections with other pathogens, and should not be used as the sole basis for treatment or other patient management decisions. Negative results must be combined with clinical observations, patient history, and epidemiological information. The expected result is Negative. Fact Sheet for Patients: SugarRoll.be Fact Sheet for Healthcare Providers: https://www.woods-mathews.com/ This test is not yet approved or cleared by the [place] FDA and  has been authorized for detection and/or diagnosis of SARS-CoV-2 by FDA under an Emergency Use Authorization (EUA). This EUA will remain  in effect (meaning this test can be used) for the duration of the COVID-19 declaration under Section 56 4(b)(1) of the Act, 21 U.S.C. section 360bbb-3(b)(1), unless the authorization is terminated or revoked sooner. Performed at [facility], [place] [address] [place], [place] [address]   MRSA PCR Screening     Status: None   Collection Time: 10/06/18  8:45 PM   Specimen: Nasopharyngeal  Result Value Ref Range Status   MRSA by PCR NEGATIVE NEGATIVE Final    Comment:        The GeneXpert MRSA Assay (FDA approved for NASAL specimens only), is one component of a comprehensive MRSA colonization surveillance program. It is not intended to diagnose MRSA infection nor to guide or monitor treatment for MRSA infections. Performed at [facility], [place] [address], [place], [place] [address]          Radiology Studies: Ir Fluoro Guide Cv Line Right  Result Date: 10/11/2018 INDICATION: End-stage renal disease. Patient  underwent placement of a temporary hemodialysis catheter on 10/06/2018 for the initiation of dialysis and returns today for fluoroscopic guided conversion the temporary dialysis to a tunneled/permanent dialysis catheter for durable intravenous access. EXAM: FLUOROSCOPIC GUIDED CONVERSION OF NON TUNNELED TO TUNNELED CENTRAL VENOUS HEMODIALYSIS CATHETER COMPARISON:  Image guided placement of a non tunneled temporary dialysis catheter-10/06/2018 MEDICATIONS: None ANESTHESIA/SEDATION: Moderate (conscious) sedation was employed during this procedure. A total of Versed 1 mg and Fentanyl 50 mcg was administered intravenously. Moderate Sedation Time: 15 minutes. The patient's level of consciousness and vital signs were monitored continuously by radiology nursing throughout the procedure under my direct supervision. FLUOROSCOPY TIME:  42 seconds (6 mGy) COMPLICATIONS: None immediate. PROCEDURE: Informed written consent was obtained from the patient after a discussion of the risks, benefits, and alternatives to treatment. Questions regarding the procedure were encouraged and answered. The skin and external portion of the existing hemodialysis catheter was prepped with chlorhexidine in a sterile fashion, and a sterile drape was applied covering the operative field. Maximum barrier sterile technique with  sterile gowns and gloves were used for the procedure. A timeout was performed prior to the initiation of the procedure. A lumen of the none tunneled temporary dialysis catheter was cannulated with a stiff Glidewire in utilized for measurement purposes. Next, the Glidewire was advanced to the level of the IVC. A 19 cm tip to cuff palindrome dialysis catheter was tunneled from a site along the anterior chest to the venotomy site. Under intermittent fluoroscopic guidance, the temporary dialysis catheter was exchanged for a peel-away sheath. The tunneled dialysis catheter was inserted into the peel-away sheath with tips open  terminating within in the superior aspect the right atrium. Final catheter positioning was confirmed and documented with a spot fluoroscopic image. The catheter aspirates and flushes normally. The catheter was flushed with appropriate volume heparin dwells. The catheter exit site was secured with a 0-Prolene retention sutures. The venotomy site was apposed with derma bond Steri-Strips. Both lumens were heparinized. A dressing was placed. The patient tolerated the procedure well without immediate post procedural complication. IMPRESSION: Successful fluoroscopic guided conversion of a temporary to a permanent 19 cm tip to cuff tunneled hemodialysis catheter with tips terminating within the right atrium. The catheter is ready for immediate use. Electronically Signed   By: Sandi Mariscal M.D.   On: 10/11/2018 16:32        Scheduled Meds:  amLODipine  5 mg Oral Daily   aspirin EC  81 mg Oral Daily   carvedilol  3.125 mg Oral BID WC   Chlorhexidine Gluconate Cloth  6 each Topical Q0600   cholecalciferol  1,000 Units Oral Daily   feeding supplement (PRO-STAT SUGAR FREE 64)  30 mL Oral BID   heparin injection (subcutaneous)  5,000 Units Subcutaneous Q8H   hydrALAZINE  25 mg Oral Q8H   influenza vaccine adjuvanted  0.5 mL Intramuscular Tomorrow-1000   multivitamin  1 tablet Oral QHS   pantoprazole  40 mg Oral BID   predniSONE  60 mg Oral Q breakfast   Continuous Infusions:  sodium chloride     sodium chloride     sodium chloride 5 mL (10/12/18 0657)   furosemide 160 mg (10/12/18 0700)     LOS: 6 days    Time spent: 35 mins.More than 50% of that time was spent in counseling and/or coordination of care.      Shelly Coss, MD [facility] Pager [phone]  If 7PM-7AM, please contact night-coverage [email or web address] Password [facility] 10/12/2018, 11:28 AM

## 2018-10-12 NOTE — Progress Notes (Signed)
[facility] Progress Note    Assessment/ Plan:   78 year old male with progressive renal failure, PLA2-Rpositive membranous nephropathy, anasarca. Despite very high-dose diuretics as an outpatient, his renal function is worse and his volume status is not improved. Potentially he is 40 pounds heavier than when we weighed him in July. 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  HD 9/21  1. Progressive renal failure, AoCKD 4-- with evidence of volume overload and failing high-dose diuretics of Lasix 160 IV TID. Needs dialysis. HD #3 today9/21.   Continue Lasix for now between HD sessions; he is voiding (1000 ml/ past 24hr)   CLIP started for outpt dialysis for AKI on CKD 24hr urine for cr cl should be completed today.  Requested for VIR to convert to TC.  Should be able to be d/c after TC placement; he has a seat at [facility] for dialysis TTS 2nd shift 1st treatment on Sat.  2. PLA 2Rpositive membranous nephropathy;nephrotic syndrome-- hasn't been treated to date, may consider ritux before d/c. He has advanced features on his biopsy and I d/w spouse Benjamine Mola -> they wanttreatment.  Started with modified Ponticelli protocolwith Solumedrol 1gm IV qdaily x3 doses (started 9/21)followed by prednisone 0.5mg /kg for 28 days for mths 1,3,5 and then ctx oral mths 2,4,6. F/u with Dr. Posey Pronto in 2 weeks after d/c.   Last day of solumedrol pulse today; will start prednisone 60mg  daily tomorrow. Needs to be d/c on Prednisone 60mg  daily   3. Acute on chronic Chronic diastolic heart failure  4. Bicuspid aortic valve with moderate aortic stenosis  5. Progressive physical decline  6. History of non-small cell lung cancer  7. Hypertension, currently uncontrolled  Subjective:   Breathing betterand able to sit in chair and tolerate PT (up and down the hallway).   Objective:   BP (!) 145/73   Pulse 76   Temp 97.6 F (36.4  C)   Resp 12   Ht 5\' 10"  (1.778 m)   Wt 93 kg   SpO2 93%   BMI 29.42 kg/m   Intake/Output Summary (Last 24 hours) at 10/12/2018 0656 Last data filed at 10/12/2018 0235 Gross per 24 hour  Intake 875.45 ml  Output 55 ml  Net 820.45 ml   Weight change:   Physical Exam: Gen: older gentleman, sitting upright in bed CVS:RRR Resp: CTA  Abd: some abd fullness Ext:2-3+ anasarca  Imaging: Ir Fluoro Guide Cv Line Right  Result Date: 10/11/2018 INDICATION: End-stage renal disease. Patient underwent placement of a temporary hemodialysis catheter on 10/06/2018 for the initiation of dialysis and returns today for fluoroscopic guided conversion the temporary dialysis to a tunneled/permanent dialysis catheter for durable intravenous access. EXAM: FLUOROSCOPIC GUIDED CONVERSION OF NON TUNNELED TO TUNNELED CENTRAL VENOUS HEMODIALYSIS CATHETER COMPARISON:  Image guided placement of a non tunneled temporary dialysis catheter-10/06/2018 MEDICATIONS: None ANESTHESIA/SEDATION: Moderate (conscious) sedation was employed during this procedure. A total of Versed 1 mg and Fentanyl 50 mcg was administered intravenously. Moderate Sedation Time: 15 minutes. The patient's level of consciousness and vital signs were monitored continuously by radiology nursing throughout the procedure under my direct supervision. FLUOROSCOPY TIME:  42 seconds (6 mGy) COMPLICATIONS: None immediate. PROCEDURE: Informed written consent was obtained from the patient after a discussion of the risks, benefits, and alternatives to treatment. Questions regarding the procedure were encouraged and answered. The skin and external portion of the existing hemodialysis catheter was prepped with chlorhexidine in a sterile fashion, and a sterile drape  was applied covering the operative field. Maximum barrier sterile technique with sterile gowns and gloves were used for the procedure. A timeout was performed prior to the initiation of the procedure. A lumen  of the none tunneled temporary dialysis catheter was cannulated with a stiff Glidewire in utilized for measurement purposes. Next, the Glidewire was advanced to the level of the IVC. A 19 cm tip to cuff palindrome dialysis catheter was tunneled from a site along the anterior chest to the venotomy site. Under intermittent fluoroscopic guidance, the temporary dialysis catheter was exchanged for a peel-away sheath. The tunneled dialysis catheter was inserted into the peel-away sheath with tips open terminating within in the superior aspect the right atrium. Final catheter positioning was confirmed and documented with a spot fluoroscopic image. The catheter aspirates and flushes normally. The catheter was flushed with appropriate volume heparin dwells. The catheter exit site was secured with a 0-Prolene retention sutures. The venotomy site was apposed with derma bond Steri-Strips. Both lumens were heparinized. A dressing was placed. The patient tolerated the procedure well without immediate post procedural complication. IMPRESSION: Successful fluoroscopic guided conversion of a temporary to a permanent 19 cm tip to cuff tunneled hemodialysis catheter with tips terminating within the right atrium. The catheter is ready for immediate use. Electronically Signed   By: Sandi Mariscal M.D.   On: 10/11/2018 16:32    Labs: BMET Recent Labs  Lab 10/05/18 1255 10/06/18 0450 10/07/18 0309 10/09/18 1049 10/10/18 1710 10/11/18 0320 10/12/18 0226  NA 144 143 141 142 137 137 137  K 3.5 3.3* 3.5 3.3* 4.3 4.8 4.4  CL 103 106 102 104 101 105 101  CO2 24 25 24 26 26  21* 25  GLUCOSE 96 98 90 121* 142* 127* 148*  BUN 91* 92* 70* 67* 54* 64* 79*  CREATININE 5.04* 5.20* 4.19* 4.38* 3.78* 4.18* 4.65*  CALCIUM 8.5* 8.5* 8.2* 8.1* 8.2* 8.1* 8.2*  PHOS  --  5.4*  --   --   --   --   --    CBC Recent Labs  Lab 10/05/18 1255 10/06/18 0450 10/06/18 1536 10/07/18 0309 10/09/18 1049 10/11/18 0935  WBC 8.2 7.2 8.6 8.9 8.2  9.5  NEUTROABS 7.8* 5.8  --  7.3 6.8  --   HGB 11.2* 10.7* 11.6* 11.2* 11.0* 10.5*  HCT 34.5* 33.6* 36.7* 34.3* 34.6* 32.5*  MCV 76.3* 76.4* 77.1* 76.4* 78.1* 76.5*  PLT 194 204 239 192 195 179    Medications:    . amLODipine  5 mg Oral Daily  . aspirin EC  81 mg Oral Daily  . carvedilol  3.125 mg Oral BID WC  . Chlorhexidine Gluconate Cloth  6 each Topical Q0600  . cholecalciferol  1,000 Units Oral Daily  . feeding supplement (PRO-STAT SUGAR FREE 64)  30 mL Oral BID  . heparin injection (subcutaneous)  5,000 Units Subcutaneous Q8H  . hydrALAZINE  25 mg Oral Q8H  . influenza vaccine adjuvanted  0.5 mL Intramuscular Tomorrow-1000  . multivitamin  1 tablet Oral QHS  . pantoprazole  40 mg Oral BID      Otelia Santee, MD 10/12/2018, 6:56 AM

## 2018-10-12 NOTE — Progress Notes (Signed)
Patient has been accepted for OP HD treatment at [facility] on a TTS schedule with a seat time of 1:00pm. He needs to arrive to his appointments at 12:40pm. If he is medically stable for discharge tomorrow, he needs to report to the clinic by 3pm to sign intake paperwork in order to start in the clinic on Saturday, 10/14/18. If he will start in the clinic on Tuesday, 10/17/18, he will need to arrive at 12:00pm to complete paperwork prior to his first treatment. Renal Navigator notified Dr. Lin/Nephrologist and met with patient and wife to inform.   Andrew Guerra, [place] Renal Navigator [phone]

## 2018-10-12 NOTE — Progress Notes (Signed)
Physical Therapy Treatment Patient Details Name: Andrew Guerra MRN: [id number] DOB: 1940-08-17 Today's Date: 10/12/2018    History of Present Illness 78 y.o. 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    PT Comments    Pt agreeable to walking with therapy while waiting for his breakfast to arrive. Pt is min guard for with bed mobility, supervision for transfers and min guard for ambulation with RW. Pt ambulated on 6L via [place] and SaO2 88-90%O2. D/c plans remain appropriate at this time. PT will continue to follow acutely.    Follow Up Recommendations  Home health PT;Supervision for mobility/OOB     Equipment Recommendations  None recommended by PT       Precautions / Restrictions Precautions Precautions: Fall;Other (comment) Precaution Comments: watch sats Restrictions Weight Bearing Restrictions: No    Mobility  Bed Mobility Overal bed mobility: Needs Assistance Bed Mobility: Supine to Sit     Supine to sit: Min guard;HOB elevated     General bed mobility comments: min guard for safety, increased time to come to EoB  Transfers Overall transfer level: Needs assistance Equipment used: Rolling walker (2 wheeled) Transfers: Sit to/from Stand Sit to Stand: Supervision         General transfer comment: supervision for safety  Ambulation/Gait Ambulation/Gait assistance: Min guard Gait Distance (Feet): 225 Feet Assistive device: Rolling walker (2 wheeled) Gait Pattern/deviations: Step-through pattern;Decreased stride length Gait velocity: decreased Gait velocity interpretation: <1.31 ft/sec,  indicative of household ambulator General Gait Details: min guard for safety; steady gait with RW       Balance Overall balance assessment: Needs assistance Sitting-balance support: No upper extremity supported;Feet supported Sitting balance-Leahy Scale: Good     Standing balance support: During functional activity Standing balance-Leahy Scale: Poor Standing balance comment: reliant on BUE support                            Cognition Arousal/Alertness: Awake/alert Behavior During Therapy: WFL for tasks assessed/performed Overall Cognitive Status: Within Functional Limits for tasks assessed                                           General Comments General comments (skin integrity, edema, etc.): Pt on 6L O2 via [place] SaO2 88-90%O2 with ambulation, max pulse with ambulation 125 bpm      Pertinent Vitals/Pain Pain Assessment: No/denies pain           PT Goals (current goals can now be found in the care plan section) Acute Rehab PT Goals Patient Stated Goal: home PT Goal Formulation: With patient Time For Goal Achievement: 10/22/18 Potential to Achieve Goals: Good Progress towards PT goals: Progressing toward goals    Frequency    Min 3X/week      PT Plan Current plan remains appropriate       AM-PAC PT "6 Clicks" Mobility   Outcome Measure  Help needed turning from your back to your side while in a flat bed without using bedrails?: None Help needed moving from lying on your back to  sitting on the side of a flat bed without using bedrails?: A Little Help needed moving to and from a bed to a chair (including a wheelchair)?: A Little Help needed standing up from a chair using your arms (e.g., wheelchair or bedside chair)?: A Little Help needed to walk in hospital room?: A Little Help needed climbing 3-5 steps with a railing? : A Lot 6 Click Score: 18    End of Session Equipment Utilized During Treatment: Oxygen;Gait belt Activity  Tolerance: Patient tolerated treatment well Patient left: in chair;with nursing/sitter in room;Other (comment)(NT to give bath in front of sink) Nurse Communication: Mobility status PT Visit Diagnosis: Other abnormalities of gait and mobility (R26.89)     Time: 4591-3685 PT Time Calculation (min) (ACUTE ONLY): 24 min  Charges:  $Gait Training: 23-37 mins                     Shi Blankenship B. Migdalia Dk PT, DPT Acute Rehabilitation Services Pager [phone] Office [phone]    [place] 10/12/2018, 4:13 PM

## 2018-10-12 NOTE — Progress Notes (Signed)
[facility] Progress Note    Assessment/ Plan:   78 year old male with progressive renal failure, PLA2-Rpositive membranous nephropathy, anasarca. Despite very high-dose diuretics as an outpatient, his renal function is worse and his volume status is not improved. Potentially he is 40 pounds heavier than when we weighed him in July. 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  HD 9/21  1. Progressive renal failure, AoCKD 4-- with evidence of volume overload and failing high-dose diuretics of Lasix 160 IV TID. Needs dialysis. HD #3 today9/21.   Continue Lasix for now between HD sessions; he is voiding (1000 ml/ past 24hr)  Plan on HD tomorrow  CLIP started for outpt dialysis for AKI on CKD 24hr urine for cr cl starting today as well  Requested for VIR to convert to TC.  2. PLA 2Rpositive membranous nephropathy;nephrotic syndrome-- hasn't been treated to date, may consider ritux before d/c. He has advanced features on his biopsy and I d/w spouse Benjamine Mola -> they wanttreatment.  Started with modified Ponticelli protocolwith Solumedrol 1gm IV qdaily x3 doses (started 9/21)followed by prednisone 0.5mg /kg for 28 days for mths 1,3,5 and then ctx oral mths 2,4,6. F/u with Dr. Posey Pronto in 2 weeks after d/c.   Last day of solumedrol pulse 9/23; will start prednisone 60mg  daily 9/24. Needs to be d/c with prednisone 60mg  daily.  Will plan on HD Fri AM.  Still collecting 24hr cr cl.  3. Acute on chronic Chronic diastolic heart failure  4. Bicuspid aortic valve with moderate aortic stenosis  5. Progressive physical decline  6. History of non-small cell lung cancer  7. Hypertension, currently uncontrolled  Subjective:   Breathing betterand able to sit in chair and tolerate PT (up and down the hallway).  No events overnight.   Objective:   BP (!) 145/73   Pulse 76   Temp 97.6 F (36.4 C)   Resp 12   Ht  5\' 10"  (1.778 m)   Wt 93 kg   SpO2 93%   BMI 29.42 kg/m   Intake/Output Summary (Last 24 hours) at 10/12/2018 8841 Last data filed at 10/12/2018 0235 Gross per 24 hour  Intake 875.45 ml  Output 55 ml  Net 820.45 ml   Weight change:   Physical Exam: Gen: older gentleman, sitting upright in bed CVS:RRR Resp: CTA  Abd: some abd fullness Ext:2-3+ anasarca GU: condom cath Access: RIJ TC  Imaging: Ir Fluoro Guide Cv Line Right  Result Date: 10/11/2018 INDICATION: End-stage renal disease. Patient underwent placement of a temporary hemodialysis catheter on 10/06/2018 for the initiation of dialysis and returns today for fluoroscopic guided conversion the temporary dialysis to a tunneled/permanent dialysis catheter for durable intravenous access. EXAM: FLUOROSCOPIC GUIDED CONVERSION OF NON TUNNELED TO TUNNELED CENTRAL VENOUS HEMODIALYSIS CATHETER COMPARISON:  Image guided placement of a non tunneled temporary dialysis catheter-10/06/2018 MEDICATIONS: None ANESTHESIA/SEDATION: Moderate (conscious) sedation was employed during this procedure. A total of Versed 1 mg and Fentanyl 50 mcg was administered intravenously. Moderate Sedation Time: 15 minutes. The patient's level of consciousness and vital signs were monitored continuously by radiology nursing throughout the procedure under my direct supervision. FLUOROSCOPY TIME:  42 seconds (6 mGy) COMPLICATIONS: None immediate. PROCEDURE: Informed written consent was obtained from the patient after a discussion of the risks, benefits, and alternatives to treatment. Questions regarding the procedure were encouraged and answered. The skin and external portion of the existing hemodialysis catheter was prepped with chlorhexidine in a sterile fashion, and a sterile  drape was applied covering the operative field. Maximum barrier sterile technique with sterile gowns and gloves were used for the procedure. A timeout was performed prior to the initiation of the  procedure. A lumen of the none tunneled temporary dialysis catheter was cannulated with a stiff Glidewire in utilized for measurement purposes. Next, the Glidewire was advanced to the level of the IVC. A 19 cm tip to cuff palindrome dialysis catheter was tunneled from a site along the anterior chest to the venotomy site. Under intermittent fluoroscopic guidance, the temporary dialysis catheter was exchanged for a peel-away sheath. The tunneled dialysis catheter was inserted into the peel-away sheath with tips open terminating within in the superior aspect the right atrium. Final catheter positioning was confirmed and documented with a spot fluoroscopic image. The catheter aspirates and flushes normally. The catheter was flushed with appropriate volume heparin dwells. The catheter exit site was secured with a 0-Prolene retention sutures. The venotomy site was apposed with derma bond Steri-Strips. Both lumens were heparinized. A dressing was placed. The patient tolerated the procedure well without immediate post procedural complication. IMPRESSION: Successful fluoroscopic guided conversion of a temporary to a permanent 19 cm tip to cuff tunneled hemodialysis catheter with tips terminating within the right atrium. The catheter is ready for immediate use. Electronically Signed   By: Sandi Mariscal M.D.   On: 10/11/2018 16:32    Labs: BMET Recent Labs  Lab 10/05/18 1255 10/06/18 0450 10/07/18 0309 10/09/18 1049 10/10/18 1710 10/11/18 0320 10/12/18 0226  NA 144 143 141 142 137 137 137  K 3.5 3.3* 3.5 3.3* 4.3 4.8 4.4  CL 103 106 102 104 101 105 101  CO2 24 25 24 26 26  21* 25  GLUCOSE 96 98 90 121* 142* 127* 148*  BUN 91* 92* 70* 67* 54* 64* 79*  CREATININE 5.04* 5.20* 4.19* 4.38* 3.78* 4.18* 4.65*  CALCIUM 8.5* 8.5* 8.2* 8.1* 8.2* 8.1* 8.2*  PHOS  --  5.4*  --   --   --   --   --    CBC Recent Labs  Lab 10/05/18 1255 10/06/18 0450 10/06/18 1536 10/07/18 0309 10/09/18 1049 10/11/18 0935  WBC  8.2 7.2 8.6 8.9 8.2 9.5  NEUTROABS 7.8* 5.8  --  7.3 6.8  --   HGB 11.2* 10.7* 11.6* 11.2* 11.0* 10.5*  HCT 34.5* 33.6* 36.7* 34.3* 34.6* 32.5*  MCV 76.3* 76.4* 77.1* 76.4* 78.1* 76.5*  PLT 194 204 239 192 195 179    Medications:    . amLODipine  5 mg Oral Daily  . aspirin EC  81 mg Oral Daily  . carvedilol  3.125 mg Oral BID WC  . Chlorhexidine Gluconate Cloth  6 each Topical Q0600  . cholecalciferol  1,000 Units Oral Daily  . feeding supplement (PRO-STAT SUGAR FREE 64)  30 mL Oral BID  . heparin injection (subcutaneous)  5,000 Units Subcutaneous Q8H  . hydrALAZINE  25 mg Oral Q8H  . influenza vaccine adjuvanted  0.5 mL Intramuscular Tomorrow-1000  . multivitamin  1 tablet Oral QHS  . pantoprazole  40 mg Oral BID      Otelia Santee, MD 10/12/2018, 7:02 AM

## 2018-10-13 LAB — RENAL FUNCTION PANEL
Albumin: 2.3 g/dL — ABNORMAL LOW (ref 3.5–5.0)
Anion gap: 13 (ref 5–15)
BUN: 93 mg/dL — ABNORMAL HIGH (ref 8–23)
CO2: 25 mmol/L (ref 22–32)
Calcium: 7.8 mg/dL — ABNORMAL LOW (ref 8.9–10.3)
Chloride: 100 mmol/L (ref 98–111)
Creatinine, Ser: 4.82 mg/dL — ABNORMAL HIGH (ref 0.61–1.24)
GFR calc Af Amer: 12 mL/min — ABNORMAL LOW (ref >60)
GFR calc non Af Amer: 11 mL/min — ABNORMAL LOW (ref >60)
Glucose, Bld: 95 mg/dL (ref 70–99)
Phosphorus: 7.2 mg/dL — ABNORMAL HIGH (ref 2.5–4.6)
Potassium: 4.2 mmol/L (ref 3.5–5.1)
Sodium: 138 mmol/L (ref 135–145)

## 2018-10-13 LAB — CBC
HCT: 31.4 % — ABNORMAL LOW (ref 39.0–52.0)
Hemoglobin: 10.1 g/dL — ABNORMAL LOW (ref 13.0–17.0)
MCH: 24.9 pg — ABNORMAL LOW (ref 26.0–34.0)
MCHC: 32.2 g/dL (ref 30.0–36.0)
MCV: 77.3 fL — ABNORMAL LOW (ref 80.0–100.0)
Platelets: 191 10*3/uL (ref 150–400)
RBC: 4.06 MIL/uL — ABNORMAL LOW (ref 4.22–5.81)
RDW: 21 % — ABNORMAL HIGH (ref 11.5–15.5)
WBC: 10.3 10*3/uL (ref 4.0–10.5)
nRBC: 0.3 % — ABNORMAL HIGH (ref 0.0–0.2)

## 2018-10-13 MED ORDER — FUROSEMIDE 80 MG PO TABS: 80.0000 mg | ORAL_TABLET | Freq: Every day | ORAL | 0 refills | Status: DC

## 2018-10-13 MED ORDER — AMLODIPINE BESYLATE 5 MG PO TABS: 5.0000 mg | ORAL_TABLET | Freq: Every day | ORAL | 0 refills | Status: DC

## 2018-10-13 MED ORDER — CARVEDILOL 3.125 MG PO TABS: 3.1250 mg | ORAL_TABLET | Freq: Two times a day (BID) | ORAL | 0 refills | Status: DC

## 2018-10-13 MED ORDER — HYDRALAZINE HCL 25 MG PO TABS: 25.0000 mg | ORAL_TABLET | Freq: Three times a day (TID) | ORAL | 0 refills | Status: DC

## 2018-10-13 MED ORDER — HEPARIN SODIUM (PORCINE) 1000 UNIT/ML IJ SOLN
INTRAMUSCULAR | Status: AC
  Filled 2018-10-13: qty 4

## 2018-10-13 MED ORDER — PREDNISONE 20 MG PO TABS: 60.0000 mg | ORAL_TABLET | Freq: Every day | ORAL | 0 refills | Status: DC

## 2018-10-13 MED ORDER — ASPIRIN 81 MG PO TBEC: 81.0000 mg | DELAYED_RELEASE_TABLET | Freq: Every day | ORAL | 0 refills | Status: DC

## 2018-10-13 NOTE — Progress Notes (Signed)
Requested Lasix from pharmacy. Report given to [facility] in HD at [place] and informed to hold off on IV Lasix (not yet sent from pharmacy) since going to HD.   Patient left unit for HD at approx [phone]

## 2018-10-13 NOTE — TOC Transition Note (Addendum)
Transition of Care [facility]) - CM/SW Discharge Note   Patient Details  Name: Andrew Guerra MRN: [id number] Date of Birth: 07/14/1940  [facility] of [facility]) CM/SW Contact:  Sharin Mons, RN Phone Number: 10/13/2018, 1:01 PM   Clinical Narrative:    Admitted with SOB, progressive kidney failure.Hx of HTN, hyperlipidemia, bicuspid aortic valve with moderateaortic stenosis,NSCLC/resection and radiation, PUD, chronic hypoxic respiratory failure on 2 L of oxygen via nasal cannulae at home,chronic kidney disease, chronic diastolic congestive heart failure. From home with wife.  Pt will transition to home today. HHPT order noted. Choice offered and accepted per pt. Referral made with [facility] and accepted. Pt will follow-up OP HD, [facility] TTS.     [facility] to provide pt with portable oxygen tank for transportation to home,will deliver to bedside prior to d/c.    Wife providing transportation home.  Andrew Guerra (Spouse)     [phone]       PCP: Sallee Lange  Final level of care: home with home health services Barriers to Discharge: No Barriers Identified   Patient Goals and CMS Choice Patient states their goals for this hospitalization and ongoing recovery are:: to get better CMS Medicare.gov Compare Post Acute Care list provided to:: Patient Choice offered to / list presented to : Patient, Spouse  Discharge Plan and Services In-house Referral: NA Discharge Planning Services: CM Consult Post Acute care choice : home health services         DME Arranged: N/A DME Agency: NA       HH Arranged: PT HH Agency: [place] Date [place]: 10/13/18 Time HH Agency Contacted: 1300 Representative spoke with at [place]: [place] ([place]) Interventions     Readmission Risk Interventions No flowsheet data found.

## 2018-10-13 NOTE — Discharge Summary (Signed)
Physician Discharge Summary  Andrew Guerra:[id number] DOB: December 15, 1940 DOA: 10/04/2018  PCP: [name], MD  Admit date: 10/04/2018 Discharge date: 10/13/2018  Admitted From: Home Disposition:  Home  Discharge Condition:Stable CODE STATUS:FULL Diet recommendation: Heart Healthy  Brief/Interim Summary: Andrew Guerra a 78 y.o.malewith medical history significant ofhypertension, hyperlipidemia, bicuspid aortic valve with moderateaortic stenosis,NSCLCstatus post resection and radiation, PUD, chronic hypoxic respiratory failure on 2 L of oxygen via nasal cannulae at home,chronic kidney disease, chronic diastolic congestive heart failure with preserved ejection fractionpresents to emergency department with complaint of worsening leg swelling and SOB. He was asked by his PCP to go to the emergency department due to worsening kidney function. Patient reported exertional shortness of breath, worsening leg swelling. In the ED, BNP was elevated more than 3000, troponin is elevated at 108-->93. Found to have worsening renal function.  Nephrology consulted.  Started on dialysis.  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  Hemodynamically stable for discharge today to home.  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 Posey Pronto as an outpatient. Renal ultrasound showed echogenic kidneys consistent with medical renal disease, no hydronephrosis, ascites, bilateral pleural effusions. Nephrology consulted, not responding to high-dose Lasix.Underwent right neck jg temp HD cath placed on 10/06/2018. Supspected to have  PLA-2R positive membranous nephropathy, nephrotic  syndrome.Nephrology started on solumedrol which has been changed to prednisone.Plan to continue steroid. Underwent tunneled catheter placment on 10/13/18. Outpatient dialysis set up.  Acute on chronic hypoxic respiratory failure/acute on chronic diastolic HF/moderate aortic stenosis: Multifactorial, fluid overload/anasarca, history of lung cancer ABG showed hypoxia proBNP elevated Troponin slightly elevated, trended down, EKG with no acute ST changes Chest x-ray showed bilateral pleural effusion Echo showed EF of 55 to 60%, right ventricular pressure overload, basal/mid inferior wall and basal/mid inferolateral wall abnormality, pleural effusion Continue oral lasix Respiratory symptoms are likely secondary to pulmonary edema that improved with hemodialysis.  Patient will benefit from cardiology follow-up on discharge for valvular heart disease (aortic valve). I have forwarded his information. Today he is on maintaining saturation on his baseline oxygen requirement, 2 L/min.  Hypertension Continue current regimen  Anemia of chronic kidney disease Hemoglobin stable  History of NSCLC Status post right upper lobectomy and radiation  GERD Continue Protonix   Discharge Diagnoses:  Active Problems:   Essential hypertension, benign   Hyperlipidemia   Malignant neoplasm of left lung (HCC)   Acute on chronic renal failure (HCC)   Acute on chronic respiratory failure [facility])    Discharge Instructions  Discharge Instructions    Diet - low sodium heart healthy   Complete by: As directed    Discharge instructions   Complete by: As directed    1)Take prescribed medications as instructed. 2)Follow up at outpatient dialysis center tomorrow. 3)Follow up with a PCP in a week.Follow up with cardiology as an outpatient.   Increase activity slowly   Complete by: As directed      Allergies as of 10/13/2018      Reactions   Ambien [zolpidem Tartrate] Other (See Comments)   Has  hallucinations and Homicidal Ideations per Wife    Atorvastatin    Achy and tired      Medication List    STOP taking these medications   torsemide  100 MG tablet Commonly known as: DEMADEX     TAKE these medications   amLODipine 5 MG tablet Commonly known as: NORVASC Take 1 tablet (5 mg total) by mouth daily. What changed:   medication strength  how much to take   aspirin 81 MG EC tablet Take 1 tablet (81 mg total) by mouth daily.   carvedilol 3.125 MG tablet Commonly known as: COREG Take 1 tablet (3.125 mg total) by mouth 2 (two) times daily with a meal.   cholecalciferol 25 MCG (1000 UT) tablet Commonly known as: VITAMIN D3 Take 1,000 Units by mouth daily.   feeding supplement (NEPRO CARB STEADY) Liqd Take 237 mLs by mouth 2 (two) times daily.   furosemide 80 MG tablet Commonly known as: Lasix Take 1 tablet (80 mg total) by mouth daily.   hydrALAZINE 25 MG tablet Commonly known as: APRESOLINE Take 1 tablet (25 mg total) by mouth every 8 (eight) hours.   multivitamin tablet Take 1 tablet by mouth daily. With vitamin D   pantoprazole 40 MG tablet Commonly known as: PROTONIX TAKE 1 TABLET BY MOUTH TWICE A DAY   predniSONE 20 MG tablet Commonly known as: DELTASONE Take 3 tablets (60 mg total) by mouth daily with breakfast.   Rocklatan 0.02-0.005 % Soln Generic drug: Netarsudil-Latanoprost Place 1 drop into both eyes at bedtime.   Simbrinza 1-0.2 % Susp Generic drug: Brinzolamide-Brimonidine Place 2-3 drops into both eyes 3 (three) times daily.   Xiidra 5 % Soln Generic drug: Lifitegrast Place 1 drop into both eyes 2 (two) times daily as needed (Dry eye).      Follow-up Information    Luking, Elayne Snare, MD. Schedule an appointment as soon as possible for a visit in 1 week(s).   Specialty: Family Medicine Contact information: [place] [address] [phone]          Allergies  Allergen Reactions  . Ambien  [Zolpidem Tartrate] Other (See Comments)    Has hallucinations and Homicidal Ideations per Wife   . Atorvastatin     Achy and tired    Consultations:  Nephrology   Procedures/Studies: Dg Chest 2 View  Result Date: 10/04/2018 CLINICAL DATA:  [age]-year-old male with shortness of breath. EXAM: CHEST - 2 VIEW COMPARISON:  Chest radiograph dated 02/03/2018 FINDINGS: Interval removal of the right-sided dialysis catheter. Small bilateral pleural effusions with bibasilar atelectasis or infiltrate. No pneumothorax. Stable cardiac silhouette. No acute osseous pathology. IMPRESSION: Small bilateral pleural effusions with bibasilar atelectasis or infiltrate. Electronically Signed   By: Anner Crete M.D.   On: 10/04/2018 13:15   US Renal  Result Date: 10/04/2018 CLINICAL DATA:  Acute on chronic renal failure EXAM: RENAL / URINARY TRACT ULTRASOUND COMPLETE COMPARISON:  01/13/2018, CT 01/09/2018 FINDINGS: Right Kidney: Renal measurements: 9.9 x 4.6 x 6.6 cm = volume: 156 mL. Increased cortical echogenicity. No hydronephrosis. Multiple, fewer than 10 cysts within the right kidney. Upper pole cyst measures 1.8 by 1.6 x 1.5 cm. Midpole cyst measures 0.7 x 1 by 0.6 cm. Lower pole septated cyst measures 1.4 by 1.7 x 1.3 cm. Left Kidney: Renal measurements: 11.2 x 6.4 x 5.4 cm = volume: 203 mL. Increased cortical echogenicity without hydronephrosis. Multiple, fewer than 10 cysts within each kidney. Upper pole cyst measures 1.8 x 1.9 by 1.4 cm. Midpole cyst measures 2.4 x 1.5 by 1.9 cm. Lower pole cyst measures 1.6 by 1.3 x 1.1 cm. Bladder: Appears normal for degree of bladder distention. Enlarged prostate  measuring 5.3 x 3.9 x 4.5 cm. Small amount of free fluid in the right upper quadrant. Incidental note made of bilateral pleural effusions. IMPRESSION: 1. Echogenic kidneys consistent with medical renal disease. No hydronephrosis. Bilateral renal cysts, fewer than 10 within each kidney. 2. Small amount of right  upper quadrant ascites 3. Bilateral pleural effusions 4. Prostatomegaly Electronically Signed   By: Donavan Foil M.D.   On: 10/04/2018 18:01   Ir Fluoro Guide Cv Line Right  Result Date: 10/11/2018 INDICATION: End-stage renal disease. Patient underwent placement of a temporary hemodialysis catheter on 10/06/2018 for the initiation of dialysis and returns today for fluoroscopic guided conversion the temporary dialysis to a tunneled/permanent dialysis catheter for durable intravenous access. EXAM: FLUOROSCOPIC GUIDED CONVERSION OF NON TUNNELED TO TUNNELED CENTRAL VENOUS HEMODIALYSIS CATHETER COMPARISON:  Image guided placement of a non tunneled temporary dialysis catheter-10/06/2018 MEDICATIONS: None ANESTHESIA/SEDATION: Moderate (conscious) sedation was employed during this procedure. A total of Versed 1 mg and Fentanyl 50 mcg was administered intravenously. Moderate Sedation Time: 15 minutes. The patient's level of consciousness and vital signs were monitored continuously by radiology nursing throughout the procedure under my direct supervision. FLUOROSCOPY TIME:  42 seconds (6 mGy) COMPLICATIONS: None immediate. PROCEDURE: Informed written consent was obtained from the patient after a discussion of the risks, benefits, and alternatives to treatment. Questions regarding the procedure were encouraged and answered. The skin and external portion of the existing hemodialysis catheter was prepped with chlorhexidine in a sterile fashion, and a sterile drape was applied covering the operative field. Maximum barrier sterile technique with sterile gowns and gloves were used for the procedure. A timeout was performed prior to the initiation of the procedure. A lumen of the none tunneled temporary dialysis catheter was cannulated with a stiff Glidewire in utilized for measurement purposes. Next, the Glidewire was advanced to the level of the IVC. A 19 cm tip to cuff palindrome dialysis catheter was tunneled from a site  along the anterior chest to the venotomy site. Under intermittent fluoroscopic guidance, the temporary dialysis catheter was exchanged for a peel-away sheath. The tunneled dialysis catheter was inserted into the peel-away sheath with tips open terminating within in the superior aspect the right atrium. Final catheter positioning was confirmed and documented with a spot fluoroscopic image. The catheter aspirates and flushes normally. The catheter was flushed with appropriate volume heparin dwells. The catheter exit site was secured with a 0-Prolene retention sutures. The venotomy site was apposed with derma bond Steri-Strips. Both lumens were heparinized. A dressing was placed. The patient tolerated the procedure well without immediate post procedural complication. IMPRESSION: Successful fluoroscopic guided conversion of a temporary to a permanent 19 cm tip to cuff tunneled hemodialysis catheter with tips terminating within the right atrium. The catheter is ready for immediate use. Electronically Signed   By: Sandi Mariscal M.D.   On: 10/11/2018 16:32   Ir Fluoro Guide Cv Line Right  Result Date: 10/06/2018 INDICATION: 78 year old male with acute need for hemodialysis. He presents for placement of a temporary hemodialysis catheter. EXAM: IR ULTRASOUND GUIDANCE VASC ACCESS RIGHT; IR RIGHT FLUORO GUIDE CV LINE MEDICATIONS: None ANESTHESIA/SEDATION: None FLUOROSCOPY TIME:  Fluoroscopy Time: 0 minutes 6 seconds (1 mGy). COMPLICATIONS: None immediate. PROCEDURE: Informed written consent was obtained from the patient after a thorough discussion of the procedural risks, benefits and alternatives. All questions were addressed. Maximal Sterile Barrier Technique was utilized including caps, mask, sterile gowns, sterile gloves, sterile drape, hand hygiene and skin antiseptic. A timeout was performed  prior to the initiation of the procedure. The right internal jugular vein was interrogated with ultrasound and found to be widely  patent. An image was obtained and stored for the medical record. Local anesthesia was attained by infiltration with 1% lidocaine. A small dermatotomy was made. Under real-time sonographic guidance, the vessel was punctured with a 21 gauge micropuncture needle. Using standard technique, the initial micro needle was exchanged over a 0.018 micro wire for a transitional 4 [place] micro sheath. The micro sheath was then exchanged over a 0.035 wire for a fascial dilator. The soft tissue tract was dilated. Next, a 16 cm non tunneled temporary hemodialysis catheter was advanced over the wire and positioned with the tip at the cavoatrial junction. The catheter flushes and aspirates with ease. An image was obtained and stored for the medical record. The catheter was flushed with heparinized saline and capped before being secured to the skin with 0 Prolene suture. A sterile bandage was applied. IMPRESSION: Successful placement of a right IJ approach non tunneled hemodialysis catheter. The catheter tip is at the cavoatrial junction and is ready for immediate use. Electronically Signed   By: Jacqulynn Cadet M.D.   On: 10/06/2018 17:11   Ir US Guide Vasc Access Right  Result Date: 10/06/2018 INDICATION: 78 year old male with acute need for hemodialysis. He presents for placement of a temporary hemodialysis catheter. EXAM: IR ULTRASOUND GUIDANCE VASC ACCESS RIGHT; IR RIGHT FLUORO GUIDE CV LINE MEDICATIONS: None ANESTHESIA/SEDATION: None FLUOROSCOPY TIME:  Fluoroscopy Time: 0 minutes 6 seconds (1 mGy). COMPLICATIONS: None immediate. PROCEDURE: Informed written consent was obtained from the patient after a thorough discussion of the procedural risks, benefits and alternatives. All questions were addressed. Maximal Sterile Barrier Technique was utilized including caps, mask, sterile gowns, sterile gloves, sterile drape, hand hygiene and skin antiseptic. A timeout was performed prior to the initiation of the procedure. The right  internal jugular vein was interrogated with ultrasound and found to be widely patent. An image was obtained and stored for the medical record. Local anesthesia was attained by infiltration with 1% lidocaine. A small dermatotomy was made. Under real-time sonographic guidance, the vessel was punctured with a 21 gauge micropuncture needle. Using standard technique, the initial micro needle was exchanged over a 0.018 micro wire for a transitional 4 [place] micro sheath. The micro sheath was then exchanged over a 0.035 wire for a fascial dilator. The soft tissue tract was dilated. Next, a 16 cm non tunneled temporary hemodialysis catheter was advanced over the wire and positioned with the tip at the cavoatrial junction. The catheter flushes and aspirates with ease. An image was obtained and stored for the medical record. The catheter was flushed with heparinized saline and capped before being secured to the skin with 0 Prolene suture. A sterile bandage was applied. IMPRESSION: Successful placement of a right IJ approach non tunneled hemodialysis catheter. The catheter tip is at the cavoatrial junction and is ready for immediate use. Electronically Signed   By: Jacqulynn Cadet M.D.   On: 10/06/2018 17:11       Subjective:  Patient seen and examined the bedside this morning in the dialysis suite.  Hemodynamically stable for discharge.  Discharge Exam: Vitals:   10/13/18 1030 10/13/18 1053  BP: (!) 159/83 (!) 162/86  Pulse: 70 74  Resp: 17 17  Temp:  97.7 F (36.5 C)  SpO2: 95% 95%   Vitals:   10/13/18 0930 10/13/18 1000 10/13/18 1030 10/13/18 1053  BP: (!) 153/79 (!) 164/81 [name])  159/83 (!) 162/86  Pulse: 76 72 70 74  Resp: 20 20 17 17   Temp:    97.7 F (36.5 C)  TempSrc:    Oral  SpO2: 94% 94% 95% 95%  Weight:    91.3 kg  Height:        General: Pt is alert, awake, not in acute distress Cardiovascular: RRR, S1/S2 +, no rubs, no gallops Respiratory: CTA bilaterally, no wheezing, no  rhonchi Abdominal: Soft, NT, ND, bowel sounds + Extremities: no edema, no cyanosis    The results of significant diagnostics from this hospitalization (including imaging, microbiology, ancillary and laboratory) are listed below for reference.     Microbiology: Recent Results (from the past 240 hour(s))  SARS CORONAVIRUS 2 (TAT 6-24 HRS) Nasopharyngeal Nasopharyngeal Swab     Status: None   Collection Time: 10/04/18  4:01 PM   Specimen: Nasopharyngeal Swab  Result Value Ref Range Status   SARS Coronavirus 2 NEGATIVE NEGATIVE Final    Comment: (NOTE) SARS-CoV-2 target nucleic acids are NOT DETECTED. The SARS-CoV-2 RNA is generally detectable in upper and lower respiratory specimens during the acute phase of infection. Negative results do not preclude SARS-CoV-2 infection, do not rule out co-infections with other pathogens, and should not be used as the sole basis for treatment or other patient management decisions. Negative results must be combined with clinical observations, patient history, and epidemiological information. The expected result is Negative. Fact Sheet for Patients: SugarRoll.be Fact Sheet for Healthcare Providers: https://www.woods-mathews.com/ This test is not yet approved or cleared by the [place] FDA and  has been authorized for detection and/or diagnosis of SARS-CoV-2 by FDA under an Emergency Use Authorization (EUA). This EUA will remain  in effect (meaning this test can be used) for the duration of the COVID-19 declaration under Section 56 4(b)(1) of the Act, 21 U.S.C. section 360bbb-3(b)(1), unless the authorization is terminated or revoked sooner. Performed at [facility], [place] [address], [place], [place] [address]   MRSA PCR Screening     Status: None   Collection Time: 10/06/18  8:45 PM   Specimen: Nasopharyngeal  Result Value Ref Range Status   MRSA by PCR NEGATIVE NEGATIVE Final    Comment:         The GeneXpert MRSA Assay (FDA approved for NASAL specimens only), is one component of a comprehensive MRSA colonization surveillance program. It is not intended to diagnose MRSA infection nor to guide or monitor treatment for MRSA infections. Performed at [facility], [place] [address], [place], [place] [address]      Labs: BNP (last 3 results) Recent Labs    11/09/17 1703 01/09/18 1205 10/04/18 1320  BNP 272.1* 598.0* 7,408.1*   Basic Metabolic Panel: Recent Labs  Lab 10/09/18 1049 10/10/18 1710 10/11/18 0320 10/12/18 0226 10/13/18 0840  NA 142 137 137 137 138  K 3.3* 4.3 4.8 4.4 4.2  CL 104 101 105 101 100  CO2 26 26 21* 25 25  GLUCOSE 121* 142* 127* 148* 95  BUN 67* 54* 64* 79* 93*  CREATININE 4.38* 3.78* 4.18* 4.65* 4.82*  CALCIUM 8.1* 8.2* 8.1* 8.2* 7.8*  PHOS  --   --   --   --  7.2*   Liver Function Tests: Recent Labs  Lab 10/13/18 0840  ALBUMIN 2.3*   No results for input(s): LIPASE, AMYLASE in the last 168 hours. No results for input(s): AMMONIA in the last 168 hours. CBC: Recent Labs  Lab 10/06/18 1536 10/07/18  0309 10/09/18 1049 10/11/18 0935 10/13/18 0840  WBC 8.6 8.9 8.2 9.5 10.3  NEUTROABS  --  7.3 6.8  --   --   HGB 11.6* 11.2* 11.0* 10.5* 10.1*  HCT 36.7* 34.3* 34.6* 32.5* 31.4*  MCV 77.1* 76.4* 78.1* 76.5* 77.3*  PLT 239 192 195 179 191   Cardiac Enzymes: No results for input(s): CKTOTAL, CKMB, CKMBINDEX, TROPONINI in the last 168 hours. BNP: Invalid input(s): POCBNP CBG: Recent Labs  Lab 10/08/18 0756 10/09/18 0758 10/10/18 0753 10/11/18 0804 10/12/18 0753  GLUCAP 89 72 129* 126* 107*   D-Dimer No results for input(s): DDIMER in the last 72 hours. Hgb A1c No results for input(s): HGBA1C in the last 72 hours. Lipid Profile No results for input(s): CHOL, HDL, LDLCALC, TRIG, CHOLHDL, LDLDIRECT in the last 72 hours. Thyroid function studies No results for input(s): TSH, T4TOTAL, T3FREE, THYROIDAB in the last  72 hours.  Invalid input(s): FREET3 Anemia work up No results for input(s): VITAMINB12, FOLATE, FERRITIN, TIBC, IRON, RETICCTPCT in the last 72 hours. Urinalysis    Component Value Date/Time   COLORURINE YELLOW 10/04/2018 1425   APPEARANCEUR HAZY (A) 10/04/2018 1425   LABSPEC 1.015 10/04/2018 1425   PHURINE 6.0 10/04/2018 1425   GLUCOSEU 50 (A) 10/04/2018 1425   HGBUR SMALL (A) 10/04/2018 1425   BILIRUBINUR NEGATIVE 10/04/2018 1425   KETONESUR NEGATIVE 10/04/2018 1425   PROTEINUR >=300 (A) 10/04/2018 1425   NITRITE NEGATIVE 10/04/2018 1425   LEUKOCYTESUR MODERATE (A) 10/04/2018 1425   Sepsis Labs Invalid input(s): PROCALCITONIN,  WBC,  LACTICIDVEN Microbiology Recent Results (from the past 240 hour(s))  SARS CORONAVIRUS 2 (TAT 6-24 HRS) Nasopharyngeal Nasopharyngeal Swab     Status: None   Collection Time: 10/04/18  4:01 PM   Specimen: Nasopharyngeal Swab  Result Value Ref Range Status   SARS Coronavirus 2 NEGATIVE NEGATIVE Final    Comment: (NOTE) SARS-CoV-2 target nucleic acids are NOT DETECTED. The SARS-CoV-2 RNA is generally detectable in upper and lower respiratory specimens during the acute phase of infection. Negative results do not preclude SARS-CoV-2 infection, do not rule out co-infections with other pathogens, and should not be used as the sole basis for treatment or other patient management decisions. Negative results must be combined with clinical observations, patient history, and epidemiological information. The expected result is Negative. Fact Sheet for Patients: SugarRoll.be Fact Sheet for Healthcare Providers: https://www.woods-mathews.com/ This test is not yet approved or cleared by the [place] FDA and  has been authorized for detection and/or diagnosis of SARS-CoV-2 by FDA under an Emergency Use Authorization (EUA). This EUA will remain  in effect (meaning this test can be used) for the duration of  the COVID-19 declaration under Section 56 4(b)(1) of the Act, 21 U.S.C. section 360bbb-3(b)(1), unless the authorization is terminated or revoked sooner. Performed at [facility], [place] [address], [place], [place] [address]   MRSA PCR Screening     Status: None   Collection Time: 10/06/18  8:45 PM   Specimen: Nasopharyngeal  Result Value Ref Range Status   MRSA by PCR NEGATIVE NEGATIVE Final    Comment:        The GeneXpert MRSA Assay (FDA approved for NASAL specimens only), is one component of a comprehensive MRSA colonization surveillance program. It is not intended to diagnose MRSA infection nor to guide or monitor treatment for MRSA infections. Performed at [facility], [place] [address], [place], [place] [address]     Please note: You were  cared for by a hospitalist during your hospital stay. Once you are discharged, your primary care physician will handle any further medical issues. Please note that NO REFILLS for any discharge medications will be authorized once you are discharged, as it is imperative that you return to your primary care physician (or establish a relationship with a primary care physician if you do not have one) for your post hospital discharge needs so that they can reassess your need for medications and monitor your lab values.    Time coordinating discharge: 40 minutes  SIGNED:   Shelly Coss, MD  [facility] 10/13/2018, 11:24 AM Pager [phone]  If 7PM-7AM, please contact night-coverage [email or web address] Password [email or web address]

## 2018-10-13 NOTE — Progress Notes (Signed)
[facility] Progress Note    Assessment/ Plan:   78 year old male with progressive renal failure, PLA2-Rpositive membranous nephropathy, anasarca. Despite very high-dose diuretics as an outpatient, his renal function is worse and his volume status is not improved. Potentially he is 40 pounds heavier than when we weighed him in July. 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  HD 9/21  1. Progressive renal failure, AoCKD 4-- with evidence of volume overload and failing high-dose diuretics of Lasix 160 IV TID. Needs dialysis. HD #3 today9/21.   Continue Lasix for now between HD sessions; he is voiding (108ml/ past 24hr)  Plan on HD today and may help with O2 requirements; was on O2 @ home but req higher now.  -> seen on HD @ 930AM 147/79  Goal 2.5 L net 3K bath RIJ TC  CLIP for [facility] 2nd shift - may start Sat if he's d/c today. O/W will plan on HD tomorrow again for UF.  24hr urine for cr cl -> likely inadequate collection.  Requested for VIR to convert to TC.  2. PLA 2Rpositive membranous nephropathy;nephrotic syndrome-- hasn't been treated to date, may consider ritux before d/c. He has advanced features on his biopsy and I d/w spouse Benjamine Mola ->they wanttreatment.  Started with modified Ponticelli protocolwith Solumedrol 1gm IV qdaily x3 doses (started 9/21)followed by prednisone 0.5mg /kg for 28 days for mths 1,3,5 and then ctx oral mths 2,4,6. F/u with Dr. Posey Pronto in 2 weeks after d/c.  Last day of solumedrol pulse 9/23; will start prednisone 60mg  daily 9/24. Needs to be d/c with prednisone 60mg  daily.  3. Acute on chronic Chronic diastolic heart failure  4. Bicuspid aortic valve with moderate aortic stenosis  5. Progressive physical decline  6. History of non-small cell lung cancer  7. Hypertension, currently uncontrolled  Subjective:   Feeling better and denies f/c/n/v.    Objective:    BP (!) 154/94   Pulse 71   Temp (!) 97.5 F (36.4 C) (Oral)   Resp 12   Ht 5\' 10"  (1.778 m)   Wt 92.5 kg   SpO2 92%   BMI 29.26 kg/m   Intake/Output Summary (Last 24 hours) at 10/13/2018 0703 Last data filed at 10/13/2018 0445 Gross per 24 hour  Intake -  Output 725 ml  Net -725 ml   Weight change:   Physical Exam: Gen: older gentleman, sitting upright in bed CVS:RRR Resp:CTA Abd: some abd fullness Ext:2-3+ anasarca GU: condom cath Access: RIJ TC  Imaging: Ir Fluoro Guide Cv Line Right  Result Date: 10/11/2018 INDICATION: End-stage renal disease. Patient underwent placement of a temporary hemodialysis catheter on 10/06/2018 for the initiation of dialysis and returns today for fluoroscopic guided conversion the temporary dialysis to a tunneled/permanent dialysis catheter for durable intravenous access. EXAM: FLUOROSCOPIC GUIDED CONVERSION OF NON TUNNELED TO TUNNELED CENTRAL VENOUS HEMODIALYSIS CATHETER COMPARISON:  Image guided placement of a non tunneled temporary dialysis catheter-10/06/2018 MEDICATIONS: None ANESTHESIA/SEDATION: Moderate (conscious) sedation was employed during this procedure. A total of Versed 1 mg and Fentanyl 50 mcg was administered intravenously. Moderate Sedation Time: 15 minutes. The patient's level of consciousness and vital signs were monitored continuously by radiology nursing throughout the procedure under my direct supervision. FLUOROSCOPY TIME:  42 seconds (6 mGy) COMPLICATIONS: None immediate. PROCEDURE: Informed written consent was obtained from the patient after a discussion of the risks, benefits, and alternatives to treatment. Questions regarding the procedure were encouraged and answered. The skin and external  portion of the existing hemodialysis catheter was prepped with chlorhexidine in a sterile fashion, and a sterile drape was applied covering the operative field. Maximum barrier sterile technique with sterile gowns and gloves were used for  the procedure. A timeout was performed prior to the initiation of the procedure. A lumen of the none tunneled temporary dialysis catheter was cannulated with a stiff Glidewire in utilized for measurement purposes. Next, the Glidewire was advanced to the level of the IVC. A 19 cm tip to cuff palindrome dialysis catheter was tunneled from a site along the anterior chest to the venotomy site. Under intermittent fluoroscopic guidance, the temporary dialysis catheter was exchanged for a peel-away sheath. The tunneled dialysis catheter was inserted into the peel-away sheath with tips open terminating within in the superior aspect the right atrium. Final catheter positioning was confirmed and documented with a spot fluoroscopic image. The catheter aspirates and flushes normally. The catheter was flushed with appropriate volume heparin dwells. The catheter exit site was secured with a 0-Prolene retention sutures. The venotomy site was apposed with derma bond Steri-Strips. Both lumens were heparinized. A dressing was placed. The patient tolerated the procedure well without immediate post procedural complication. IMPRESSION: Successful fluoroscopic guided conversion of a temporary to a permanent 19 cm tip to cuff tunneled hemodialysis catheter with tips terminating within the right atrium. The catheter is ready for immediate use. Electronically Signed   By: Sandi Mariscal M.D.   On: 10/11/2018 16:32    Labs: BMET Recent Labs  Lab 10/07/18 0309 10/09/18 1049 10/10/18 1710 10/11/18 0320 10/12/18 0226  NA 141 142 137 137 137  K 3.5 3.3* 4.3 4.8 4.4  CL 102 104 101 105 101  CO2 24 26 26  21* 25  GLUCOSE 90 121* 142* 127* 148*  BUN 70* 67* 54* 64* 79*  CREATININE 4.19* 4.38* 3.78* 4.18* 4.65*  CALCIUM 8.2* 8.1* 8.2* 8.1* 8.2*   CBC Recent Labs  Lab 10/06/18 1536 10/07/18 0309 10/09/18 1049 10/11/18 0935  WBC 8.6 8.9 8.2 9.5  NEUTROABS  --  7.3 6.8  --   HGB 11.6* 11.2* 11.0* 10.5*  HCT 36.7* 34.3* 34.6*  32.5*  MCV 77.1* 76.4* 78.1* 76.5*  PLT 239 192 195 179    Medications:    . amLODipine  5 mg Oral Daily  . aspirin EC  81 mg Oral Daily  . carvedilol  3.125 mg Oral BID WC  . Chlorhexidine Gluconate Cloth  6 each Topical Q0600  . cholecalciferol  1,000 Units Oral Daily  . feeding supplement (PRO-STAT SUGAR FREE 64)  30 mL Oral BID  . heparin injection (subcutaneous)  5,000 Units Subcutaneous Q8H  . hydrALAZINE  25 mg Oral Q8H  . influenza vaccine adjuvanted  0.5 mL Intramuscular Tomorrow-1000  . multivitamin  1 tablet Oral QHS  . pantoprazole  40 mg Oral BID  . predniSONE  60 mg Oral Q breakfast      Otelia Santee, MD 10/13/2018, 7:03 AM

## 2018-10-13 NOTE — Progress Notes (Signed)
Patient returned from dialysis via stretcher.  A & O x4.  Denies pain.

## 2018-10-13 NOTE — Progress Notes (Signed)
Patient discharged home per MD order; discharge instructions reviewed with patient and wife; IV DIC'd; skin warm and dry, Dialysis catheter in place in right upper chest; Oxygen in room for discharge; Patient to discharge home with wife; wife to transport; Patient states 3:00pm appointment with outpatient dialysis;  Eager to leave.

## 2018-10-15 ENCOUNTER — Encounter: Payer: Self-pay | Admitting: Family Medicine

## 2018-10-16 ENCOUNTER — Telehealth: Payer: Self-pay | Admitting: Family Medicine

## 2018-10-16 NOTE — Telephone Encounter (Signed)
Nurse's-patient recently discharged from the hospital. Please call patient, let them know that we are aware that they were discharged from the hospital. Please schedule them to follow-up with [name] within the next 7 days. Advised the patient to bring all of their medications with him to the visit. Please inquire if they are having any acute issues currently and documented accordingly.  This patient was discharged on Friday from a stay in the hospital due to renal failure, edema, congestive heart failure This patient would benefit from a follow-up office visit this week.  Please connect with family.  Please discussed with Clair Gulling or his wife Benjamine Mola whether they would want to do a in person follow-up visit this week or do a virtual visit and go ahead and schedule accordingly

## 2018-10-16 NOTE — Telephone Encounter (Signed)
Discussed with pt. Pt verbalized understanding and wants to do virtual visit. Transferred to front to schedule within the next 7 days and pt told me he was not having any acute issues at this time.

## 2018-10-18 ENCOUNTER — Other Ambulatory Visit: Payer: Self-pay | Admitting: Family Medicine

## 2018-10-24 ENCOUNTER — Ambulatory Visit (INDEPENDENT_AMBULATORY_CARE_PROVIDER_SITE_OTHER): Payer: Medicare Other | Admitting: Family Medicine

## 2018-10-24 DIAGNOSIS — I10 Essential (primary) hypertension: Secondary | ICD-10-CM | POA: Diagnosis not present

## 2018-10-24 NOTE — Progress Notes (Addendum)
   Subjective:    Patient ID: Andrew Guerra, male    DOB: [birth year], 78 y.o.   MRN: [id number] Very nice patient Recently in the hospital Was sent for renal failure and fluid retention he states this is getting better They are doing dialysis 3 times a week and he is tolerating it well They are also essentially managing all of his medications and lab work. HPIHospitalization follow up.  Virtual Visit via Video Note  I connected with Andrew Guerra on 12/17/18 at  9:30 AM EDT by a video enabled telemedicine application and verified that I am speaking with the correct person using two identifiers.  Location: Patient: home Provider: office   I discussed the limitations of evaluation and management by telemedicine and the availability of in person appointments. The patient expressed understanding and agreed to proceed.  History of Present Illness:    Observations/Objective:   Assessment and Plan:   Follow Up Instructions:    I discussed the assessment and treatment plan with the patient. The patient was provided an opportunity to ask questions and all were answered. The patient agreed with the plan and demonstrated an understanding of the instructions.   The patient was advised to call back or seek an in-person evaluation if the symptoms worsen or if the condition fails to improve as anticipated.  I provided 17 minutes of non-face-to-face time during this encounter.   Andrew Lange, MD   Pt states no concerns today.  The patient does state that he would like to get a small oxygen container to wear when he is out and about currently using 2 L per nasal cannula but the big tubes are hard to carry around he would like to see if he could get 1 of the smaller units.  He states he will get the home health agency name that supplies his oxygen and he will send that to [name]  This patient also has a history of lung cancer needs a follow-up scan of the chest but is unable to get to [facility] because of age and lack of transportation so therefore we will look into getting this completed somewhere around at [facility] and then going from there.   Review of Systems  Constitutional: Negative for activity change, fatigue and fever.  HENT: Negative for congestion and rhinorrhea.   Respiratory: Negative for cough and shortness of breath.   Cardiovascular: Negative for chest pain and leg swelling.  Gastrointestinal: Negative for abdominal pain, diarrhea and nausea.  Genitourinary: Negative for dysuria and hematuria.  Neurological: Negative for weakness and headaches.  Psychiatric/Behavioral: Negative for agitation and behavioral problems.       Objective:   Physical Exam  Today's visit was via telephone Physical exam was not possible for this visit  25 minutes was spent with the patient.  This statement verifies that 25 minutes was indeed spent with the patient.  More than 50% of this visit-total duration of the visit-was spent in counseling and coordination of care. The issues that the patient came in for today as reflected in the diagnosis (s) please refer to documentation for further details.      Assessment & Plan:  Renal failure on dialysis 3 times a week continue this COPD on oxygen we will look into getting a small oxygen container History of lung cancer patient needs a follow-up CT scan of the chest he is unable to get to [place] we will work on getting this done locally.

## 2018-11-02 DIAGNOSIS — I5033 Acute on chronic diastolic (congestive) heart failure: Secondary | ICD-10-CM | POA: Diagnosis not present

## 2018-11-02 DIAGNOSIS — I132 Hypertensive heart and chronic kidney disease with heart failure and with stage 5 chronic kidney disease, or end stage renal disease: Secondary | ICD-10-CM

## 2018-11-02 DIAGNOSIS — J9620 Acute and chronic respiratory failure, unspecified whether with hypoxia or hypercapnia: Secondary | ICD-10-CM

## 2018-11-03 ENCOUNTER — Other Ambulatory Visit: Payer: Self-pay

## 2018-11-03 ENCOUNTER — Encounter (HOSPITAL_COMMUNITY): Payer: Self-pay | Admitting: Emergency Medicine

## 2018-11-03 ENCOUNTER — Emergency Department (HOSPITAL_COMMUNITY)
Admission: EM | Admit: 2018-11-03 | Discharge: 2018-11-03 | Discharge status: Against Medical Advice | Payer: Medicare Other | Attending: Emergency Medicine | Admitting: Emergency Medicine

## 2018-11-03 DIAGNOSIS — Z87891 Personal history of nicotine dependence: Secondary | ICD-10-CM | POA: Insufficient documentation

## 2018-11-03 DIAGNOSIS — Z992 Dependence on renal dialysis: Secondary | ICD-10-CM | POA: Diagnosis not present

## 2018-11-03 DIAGNOSIS — Z85118 Personal history of other malignant neoplasm of bronchus and lung: Secondary | ICD-10-CM | POA: Diagnosis not present

## 2018-11-03 DIAGNOSIS — I5032 Chronic diastolic (congestive) heart failure: Secondary | ICD-10-CM | POA: Diagnosis not present

## 2018-11-03 DIAGNOSIS — I132 Hypertensive heart and chronic kidney disease with heart failure and with stage 5 chronic kidney disease, or end stage renal disease: Secondary | ICD-10-CM | POA: Insufficient documentation

## 2018-11-03 DIAGNOSIS — Z5320 Procedure and treatment not carried out because of patient's decision for unspecified reasons: Secondary | ICD-10-CM | POA: Diagnosis not present

## 2018-11-03 DIAGNOSIS — N186 End stage renal disease: Secondary | ICD-10-CM | POA: Insufficient documentation

## 2018-11-03 DIAGNOSIS — K921 Melena: Secondary | ICD-10-CM | POA: Diagnosis present

## 2018-11-03 DIAGNOSIS — D62 Acute posthemorrhagic anemia: Secondary | ICD-10-CM | POA: Diagnosis not present

## 2018-11-03 DIAGNOSIS — Z79899 Other long term (current) drug therapy: Secondary | ICD-10-CM | POA: Diagnosis not present

## 2018-11-03 DIAGNOSIS — K922 Gastrointestinal hemorrhage, unspecified: Secondary | ICD-10-CM

## 2018-11-03 DIAGNOSIS — Z7982 Long term (current) use of aspirin: Secondary | ICD-10-CM | POA: Diagnosis not present

## 2018-11-03 LAB — CBC
HCT: 18.6 % — ABNORMAL LOW (ref 39.0–52.0)
Hemoglobin: 5.9 g/dL — CL (ref 13.0–17.0)
MCH: 26.6 pg (ref 26.0–34.0)
MCHC: 31.7 g/dL (ref 30.0–36.0)
MCV: 83.8 fL (ref 80.0–100.0)
Platelets: 247 10*3/uL (ref 150–400)
RBC: 2.22 MIL/uL — ABNORMAL LOW (ref 4.22–5.81)
RDW: 22.9 % — ABNORMAL HIGH (ref 11.5–15.5)
WBC: 11.9 10*3/uL — ABNORMAL HIGH (ref 4.0–10.5)
nRBC: 0.7 % — ABNORMAL HIGH (ref 0.0–0.2)

## 2018-11-03 LAB — COMPREHENSIVE METABOLIC PANEL
ALT: 22 U/L (ref 0–44)
AST: 22 U/L (ref 15–41)
Albumin: 2.4 g/dL — ABNORMAL LOW (ref 3.5–5.0)
Alkaline Phosphatase: 70 U/L (ref 38–126)
Anion gap: 12 (ref 5–15)
BUN: 58 mg/dL — ABNORMAL HIGH (ref 8–23)
CO2: 30 mmol/L (ref 22–32)
Calcium: 7.8 mg/dL — ABNORMAL LOW (ref 8.9–10.3)
Chloride: 97 mmol/L — ABNORMAL LOW (ref 98–111)
Creatinine, Ser: 3.97 mg/dL — ABNORMAL HIGH (ref 0.61–1.24)
GFR calc Af Amer: 15 mL/min — ABNORMAL LOW (ref >60)
GFR calc non Af Amer: 13 mL/min — ABNORMAL LOW (ref >60)
Glucose, Bld: 104 mg/dL — ABNORMAL HIGH (ref 70–99)
Potassium: 4.1 mmol/L (ref 3.5–5.1)
Sodium: 139 mmol/L (ref 135–145)
Total Bilirubin: 0.2 mg/dL — ABNORMAL LOW (ref 0.3–1.2)
Total Protein: 4.9 g/dL — ABNORMAL LOW (ref 6.5–8.1)

## 2018-11-03 LAB — PREPARE RBC (CROSSMATCH)

## 2018-11-03 LAB — POC OCCULT BLOOD, ED: Fecal Occult Bld: POSITIVE — AB

## 2018-11-03 MED ORDER — SODIUM CHLORIDE 0.9% IV SOLUTION: Freq: Once | INTRAVENOUS | Status: DC

## 2018-11-03 NOTE — ED Notes (Signed)
Dr Roslynn Amble spoke with pt about risks of leaving AMA. Pt stated he was aware of risks. AMA form signed.

## 2018-11-03 NOTE — Discharge Instructions (Addendum)
We recommended admission to the hospital given your very low blood counts.  As discussed this can be a life-threatening condition.  Should you change your mind at any point, please return to the ER to be reassessed and likely admitted.  If at any point you develop shortness of breath, passing out, worsening blood in stools, please return to ER.  Please call both your nephrologist as well as your cardiologist to get close follow-up appointments.  Please go to your dialysis sessions as previously scheduled and notify them of your low blood counts.  These will need to be monitored closely.

## 2018-11-03 NOTE — ED Provider Notes (Addendum)
[facility] EMERGENCY DEPARTMENT Provider Note   CSN: [id number] Arrival date & time: 11/03/18  6712     History   Chief Complaint Chief Complaint  Patient presents with  . Abnormal Lab  . Blood In Stools    HPI Andrew Guerra is a 78 y.o. male.  Presents emerge department with chief complaint of abnormal hemoglobin.  Patient received call and notify him that his hemoglobin was low and he did go to the ER.  Patient reports over the past week he has noted some dark stools, no diarrhea.  No bright red blood.  Has not had any associated lightheadedness, syncope, shortness of breath.  Went to dialysis yesterday and had full session.  States he has had prior history of low hemoglobin and has undergone EPO, iron, blood transfusions, GI consultation, endoscopy, colonoscopy.  Reviewed enteroscopy report over the summer, multiple AVMs s/p ablation.     HPI  Past Medical History:  Diagnosis Date  . 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  [name] Cataract    right eye - surgery to remove  . Elevated PSA 11/29/2016   [facility].  Most recent PSA near 11.  He has had atypia on biopsy.  November 2008  . Essential hypertension, benign 06/08/2012  . Full dentures   . Glaucoma   . Heart murmur    since childhood, never has caused any problems  . History of rheumatic fever 11/04/2015  . Hyperlipidemia 04/25/2013  . Hypertension   . Malignant neoplasm of left lung ([place]) 02/08/2017  . RBBB 07/12/2017  . Sickle cell trait ([place])    no problems per patient    Patient Active Problem List   Diagnosis Date Noted  . Acute on chronic respiratory failure ([place]) 10/06/2018  . Acute on chronic renal failure ([place]) 10/04/2018  . AKI (acute kidney injury) ([place])   . Acute on chronic congestive heart failure ([place])   . Heme positive stool   .  Benign neoplasm of colon   . Pulmonary hypertension, unspecified ([place]) 05/30/2018  . Chronic diastolic heart failure ([place]) 05/30/2018  . Acute renal failure ([place])   . PAF (paroxysmal atrial fibrillation) ([place])   . Pressure injury of skin 01/17/2018  . Intestinal metaplasia of gastric mucosa   . Iron deficiency anemia   . Anasarca   . Malnutrition of moderate degree 01/10/2018  . GI bleed 01/09/2018  . Acute respiratory failure with hypoxia ([place]) 11/12/2017  . Gastritis and gastroduodenitis   . Acute gastric ulcer without hemorrhage or perforation   . Duodenal ulcer   . Paraesophageal hernia   . Upper GI bleeding 11/09/2017  . CKD (chronic kidney disease), stage III 11/09/2017  . Anemia 11/09/2017  . Melena   . RBBB 07/12/2017  . Pulmonary nodule 02/17/2017  . 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 Malignant neoplasm of left lung ([place]) 02/08/2017  . Elevated PSA 11/29/2016  . Aortic valve stenosis, moderate 12/26/2015  . BPH (benign prostatic hyperplasia) 12/18/2015  . Hypertension 12/18/2015  . 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 Heart murmur 11/04/2015  . History of active rheumatic fever 11/04/2015  . Solitary pulmonary nodule 08/31/2013  . 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 Thyroid nodule 08/31/2013  . Hyperlipidemia 04/25/2013  . Essential hypertension, benign 06/08/2012  Past Surgical History:  Procedure Laterality Date  . BIOPSY  11/11/2017   Procedure: BIOPSY;  Surgeon: Lavena Bullion, DO;  Location: [facility] ENDOSCOPY;  Service: Gastroenterology;;  . BIOPSY  01/10/2018   Procedure: BIOPSY;  Surgeon: [name], MD;  Location: [facility];  Service: Gastroenterology;;  . BIOPSY  06/22/2018   Procedure: BIOPSY;  Surgeon: Yetta Flock, MD;  Location: [facility] ENDOSCOPY;  Service: Gastroenterology;;  . cataract eye surgery Right   . COLONOSCOPY  11/2009   hx polyps/[place]  . COLONOSCOPY N/A  06/22/2018   Procedure: COLONOSCOPY;  Surgeon: Yetta Flock, MD;  Location: [facility];  Service: Gastroenterology;  Laterality: N/A;  . ENTEROSCOPY N/A 06/22/2018   Procedure: PUSH ENTEROSCOPY;  Surgeon: Yetta Flock, MD;  Location: [facility] ENDOSCOPY;  Service: Gastroenterology;  Laterality: N/A;  . ESOPHAGOGASTRODUODENOSCOPY N/A 11/11/2017   Procedure: ESOPHAGOGASTRODUODENOSCOPY (EGD);  Surgeon: Lavena Bullion, DO;  Location: [facility] ENDOSCOPY;  Service: Gastroenterology;  Laterality: N/A;  . ESOPHAGOGASTRODUODENOSCOPY (EGD) WITH PROPOFOL N/A 01/10/2018   Procedure: ESOPHAGOGASTRODUODENOSCOPY (EGD) WITH PROPOFOL;  Surgeon: [name], MD;  Location: [facility];  Service: Gastroenterology;  Laterality: N/A;  . HOT HEMOSTASIS N/A 06/22/2018   Procedure: HOT HEMOSTASIS (ARGON PLASMA COAGULATION/BICAP);  Surgeon: Yetta Flock, MD;  Location: [facility] ENDOSCOPY;  Service: Gastroenterology;  Laterality: N/A;  . IR FLUORO GUIDE CV LINE RIGHT  01/16/2018  . IR FLUORO GUIDE CV LINE RIGHT  01/31/2018  . IR FLUORO GUIDE CV LINE RIGHT  10/06/2018  . IR FLUORO GUIDE CV LINE RIGHT  10/11/2018  . IR US GUIDE VASC ACCESS RIGHT  01/16/2018  . IR US GUIDE VASC ACCESS RIGHT  01/31/2018  . IR US GUIDE VASC ACCESS RIGHT  10/06/2018  . POLYPECTOMY  06/22/2018   Procedure: POLYPECTOMY;  Surgeon: Yetta Flock, MD;  Location: [facility];  Service: Gastroenterology;;  . PROSTATE BIOPSY    . right eye surgery     to lower eye pressure  . RIGHT HEART CATH N/A 01/19/2018   Procedure: RIGHT HEART CATH;  Surgeon: Nelva Bush, MD;  Location: [facility] CV LAB;  Service: Cardiovascular;  Laterality: N/A;  . TONSILLECTOMY    . VIDEO ASSISTED THORACOSCOPY (VATS)/ LOBECTOMY Right 12/26/2015  . wisdom tteeth ext          Home Medications    Prior to Admission medications   Medication Sig Start Date End Date Taking? Authorizing Provider  amLODipine (NORVASC) 5 MG tablet Take 1 tablet (5 mg  total) by mouth daily. 10/13/18   Shelly Coss, MD  aspirin EC 81 MG EC tablet Take 1 tablet (81 mg total) by mouth daily. 10/13/18   Shelly Coss, MD  Brinzolamide-Brimonidine [facility]) 1-0.2 % SUSP Place 2-3 drops into both eyes 3 (three) times daily.     [provider]  carvedilol (COREG) 3.125 MG tablet Take 1 tablet (3.125 mg total) by mouth 2 (two) times daily with a meal. 10/13/18   Shelly Coss, MD  furosemide (LASIX) 80 MG tablet Take 1 tablet (80 mg total) by mouth daily. Patient not taking: Reported on 10/24/2018 10/13/18 11/12/18  Shelly Coss, MD  hydrALAZINE (APRESOLINE) 25 MG tablet Take 1 tablet (25 mg total) by mouth every 8 (eight) hours. 10/13/18   Shelly Coss, MD  Netarsudil-Latanoprost (ROCKLATAN) 0.02-0.005 % SOLN Place 1 drop into both eyes at bedtime.     Marylynn Pearson, MD  Nutritional Supplements (FEEDING SUPPLEMENT, NEPRO CARB STEADY,) LIQD Take 237 mLs by mouth 2 (two) times daily.  [provider]  pantoprazole (PROTONIX) 40 MG tablet TAKE 1 TABLET BY MOUTH TWICE A DAY Patient taking differently: Take 40 mg by mouth 2 (two) times daily.  09/24/18   [name], MD  predniSONE (DELTASONE) 20 MG tablet Take 3 tablets (60 mg total) by mouth daily with breakfast. 10/13/18 11/12/18  Shelly Coss, MD  XIIDRA 5 % SOLN Place 1 drop into both eyes 2 (two) times daily as needed (Dry eye).  03/27/18   [provider]    Family History Family History  Problem Relation Age of Onset  . Diabetes Mother   . Cerebral aneurysm Mother   . Lung cancer Father   . Diabetes Sister   . Colon cancer Neg Hx   . Colon polyps Neg Hx   . Esophageal cancer Neg Hx   . Rectal cancer Neg Hx   . Stomach cancer Neg Hx     Social History Social History   Tobacco Use  . Smoking status: Former Smoker    Packs/day: 1.00    Types: Cigarettes    Quit date: 01/19/2011    Years since quitting: 7.7  . Smokeless tobacco: Never Used  Substance Use  Topics  . Alcohol use: Yes    Alcohol/week: 1.0 standard drinks    Types: 1 Shots of liquor per week    Comment: rarely  . Drug use: No     Allergies   Ambien [zolpidem tartrate] and Atorvastatin   Review of Systems Review of Systems  Constitutional: Negative for chills and fever.  HENT: Negative for ear pain and sore throat.   Eyes: Negative for pain and visual disturbance.  Respiratory: Negative for cough and shortness of breath.   Cardiovascular: Negative for chest pain and palpitations.  Gastrointestinal: Positive for blood in stool. Negative for abdominal pain and vomiting.  Genitourinary: Negative for dysuria and hematuria.  Musculoskeletal: Negative for arthralgias and back pain.  Skin: Negative for color change and rash.  Neurological: Negative for seizures and syncope.  All other systems reviewed and are negative.    Physical Exam Updated Vital Signs BP (!) 167/82   Pulse 73   Temp 98.8 F (37.1 C) (Oral)   Resp 18   SpO2 97%   Physical Exam Vitals signs and nursing note reviewed.  Constitutional:      Appearance: He is well-developed.  HENT:     Head: Normocephalic and atraumatic.  Eyes:     Conjunctiva/sclera: Conjunctivae normal.  Neck:     Musculoskeletal: Neck supple.  Cardiovascular:     Rate and Rhythm: Normal rate and regular rhythm.     Heart sounds: No murmur.  Pulmonary:     Effort: Pulmonary effort is normal. No respiratory distress.     Breath sounds: Normal breath sounds.  Abdominal:     Palpations: Abdomen is soft.     Tenderness: There is no abdominal tenderness.  Genitourinary:    Comments: Small skin tag around anus, no hemorrhoids noted, no fissures, stool is brown but Hemoccult positive Skin:    General: Skin is warm and dry.  Neurological:     Mental Status: He is alert.    Benjamine Mola RN at bedside to chaperone rectal exam  ED Treatments / Results  Labs (all labs ordered are listed, but only abnormal results are  displayed) Labs Reviewed  COMPREHENSIVE METABOLIC PANEL - Abnormal; Notable for the following components:      Result Value   Chloride 97 (*)    Glucose, Bld  104 (*)    BUN 58 (*)    Creatinine, Ser 3.97 (*)    Calcium 7.8 (*)    Total Protein 4.9 (*)    Albumin 2.4 (*)    Total Bilirubin 0.2 (*)    GFR calc non Af Amer 13 (*)    GFR calc Af Amer 15 (*)    All other components within normal limits  CBC - Abnormal; Notable for the following components:   WBC 11.9 (*)    RBC 2.22 (*)    Hemoglobin 5.9 (*)    HCT 18.6 (*)    RDW 22.9 (*)    nRBC 0.7 (*)    All other components within normal limits  POC OCCULT BLOOD, ED - Abnormal; Notable for the following components:   Fecal Occult Bld POSITIVE (*)    All other components within normal limits  TYPE AND SCREEN  PREPARE RBC (CROSSMATCH)    EKG None  Radiology No results found.  Procedures .Critical Care Performed by: Lucrezia Starch, MD Authorized by: Lucrezia Starch, MD   Critical care provider statement:    Critical care time (minutes):  45   Critical care was necessary to treat or prevent imminent or life-threatening deterioration of the following conditions: GI bleed.   Critical care was time spent personally by me on the following activities:  Discussions with consultants, evaluation of patient's response to treatment, examination of patient, ordering and performing treatments and interventions, ordering and review of laboratory studies, ordering and review of radiographic studies, pulse oximetry, re-evaluation of patient's condition, obtaining history from patient or surrogate and review of old charts   (including critical care time)  Medications Ordered in ED Medications  0.9 %  sodium chloride infusion (Manually program via Guardrails IV Fluids) (has no administration in time range)     Initial Impression / Assessment and Plan / ED Course  I have reviewed the triage vital signs and the nursing notes.   Pertinent labs & imaging results that were available during my care of the patient were reviewed by me and considered in my medical decision making (see chart for details).  Clinical Course as of Nov 03 1530  Fri Nov 03, 2018  1125 Reviewed chart, labs, awaiting patient to be roomed   [RD]  1125 Hemoglobin(!!): 5.9 [RD]  1125 BP(!): 169/68 [RD]  1125 Vitals stable from triage  Pulse Rate: 82 [RD]  1540 Discussed with Dr. Justin Mend, if patient unwilling to be admitted, then agrees with 1 unit blood transfusion and close outpatient follow-up with GI and nephrology, can defer EPO injection to clinic   [RD]  1237 Fecal Occult Blood, POC(!): POSITIVE [RD]  1300 Discussed with Dr. Janett Billow with [facility] - if patient still refuses admission, she will help arrange close outpatient GI f/u, likely repeat enteroscopy   [RD]  1428 Recheck patient, and blood transfusion in process, patient remains asymptomatic   [RD]  26 Recheck patient, blood transfusion almost complete, again patient refusing despite my strong recommendation for admission, will have patient sign out AMA, believe he understands my concerns and risks of discharge; wife is at bedside and also in agreement with patient   [RD]    Clinical Course User Index [RD] Lucrezia Starch, MD       78 year old male presents with low hemoglobin, dark stools.  Has prior history of GI bleed, anemia.  Suspect most likely situation today.  Stools were brown but Hemoccult positive.  Discussed case with both  nephrology and gastroenterology.  Strongly recommended admission to patient however adamant about pursuing outpatient therapy at this time.  Was willing to stay for 1 unit of blood transfusion in ER but nothing further.  Both GI and nephrology will have close outpatient follow-up.  Patient already on PPI, no medication changes recommended by either service in the interim.  Likely will need repeat scope.  Emphasized patient return precautions and  recommended to return at any point should he change his mind.  Will have patient sign AMA paperwork.  Final Clinical Impressions(s) / ED Diagnoses   Final diagnoses:  Gastrointestinal hemorrhage, unspecified gastrointestinal hemorrhage type  Acute blood loss anemia  ESRD (end stage renal disease) on dialysis [facility])    ED Discharge Orders    None       Lucrezia Starch, MD 11/03/18 1514    Lucrezia Starch, MD 11/03/18 1532    Lucrezia Starch, MD 11/03/18 1549

## 2018-11-03 NOTE — ED Notes (Signed)
Rate change in blood product to 220ml/hr

## 2018-11-03 NOTE — Progress Notes (Signed)
[facility] Renal Quick Note:  Mr. Isbell was directed to the ED with outpatient Hgb 5.6, which had been trending down. Here, Hgb 5.9 but + FOBT.  Per call from ED - pt is refusing admission, plan is for 1U PRBCs and short f/u with GI as outpatient.  I called his usual HD unit [facility]) and they will call him later and reschedule his dialysis for tomorrow.   Pls call if anything changes with this plan or if the patient is admitted.  Veneta Penton, PA-C [employer] Pager [phone]

## 2018-11-03 NOTE — ED Triage Notes (Addendum)
Pt reports he was called from [facility] today telling him his hemoglobin was low and to come to the ED. Pt also reports he saw dark blood in his stools 2-3 days ago, reports "several" BMs that had dark blood in them.Denies lightheadedness or weakness. Went to dialysis yesterday.

## 2018-11-04 LAB — BPAM RBC
Blood Product Expiration Date: 202011122359
ISSUE DATE / TIME: 202010161307
Unit Type and Rh: 7300

## 2018-11-04 LAB — TYPE AND SCREEN
ABO/RH(D): B POS
Antibody Screen: NEGATIVE
Unit division: 0

## 2018-11-08 ENCOUNTER — Encounter: Payer: Self-pay | Admitting: Family Medicine

## 2018-11-09 ENCOUNTER — Ambulatory Visit (INDEPENDENT_AMBULATORY_CARE_PROVIDER_SITE_OTHER): Payer: Medicare Other | Admitting: Gastroenterology

## 2018-11-09 ENCOUNTER — Encounter: Payer: Self-pay | Admitting: Gastroenterology

## 2018-11-09 ENCOUNTER — Other Ambulatory Visit (INDEPENDENT_AMBULATORY_CARE_PROVIDER_SITE_OTHER): Payer: Medicare Other

## 2018-11-09 ENCOUNTER — Other Ambulatory Visit: Payer: Self-pay

## 2018-11-09 VITALS — BP 170/74 | HR 80 | Temp 98.7°F | Ht 68.0 in | Wt 193.1 lb

## 2018-11-09 DIAGNOSIS — D649 Anemia, unspecified: Secondary | ICD-10-CM

## 2018-11-09 DIAGNOSIS — R195 Other fecal abnormalities: Secondary | ICD-10-CM

## 2018-11-09 DIAGNOSIS — K922 Gastrointestinal hemorrhage, unspecified: Secondary | ICD-10-CM

## 2018-11-09 DIAGNOSIS — K5520 Angiodysplasia of colon without hemorrhage: Secondary | ICD-10-CM | POA: Diagnosis not present

## 2018-11-09 LAB — CBC
HCT: 20.1 % — CL (ref 39.0–52.0)
Hemoglobin: 6.5 g/dL — CL (ref 13.0–17.0)
MCHC: 32.7 g/dL (ref 30.0–36.0)
MCV: 84.3 fl (ref 78.0–100.0)
Platelets: 193 10*3/uL (ref 150.0–400.0)
RBC: 2.38 Mil/uL — ABNORMAL LOW (ref 4.22–5.81)
RDW: 22.6 % — ABNORMAL HIGH (ref 11.5–15.5)
WBC: 9.4 10*3/uL (ref 4.0–10.5)

## 2018-11-09 NOTE — Progress Notes (Signed)
11/09/2018 Andrew Guerra [id number] 23-Jun-1940   HISTORY OF PRESENT ILLNESS:  This is an 78 year old male who is a patient of Dr. Payton Emerald.  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  Had 7 duodenal and proximal jejunal AVMs treated with APC on June 22, 2018.  Was doing well and was stable when last seen by Dr. Tarri Glenn via virtual visit on 07/27/2018.  Now with drop in hemoglobin again to 5.9 g  (was 10.1 grams just 2 weeks prior) and Hemoccult positive stool and reports of melena.  Was in the ER last week, transfused 1 unit of packed red blood cells, and discharged since patient refused hospitalization.  Reports continued melena today.  Feels fine.   Past Medical History:  Diagnosis Date  . 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  [name] Cataract    right eye - surgery to remove  . Elevated PSA 11/29/2016   [facility].  Most recent PSA near 11.  He has had atypia on biopsy.  November 2008  . Essential hypertension, benign 06/08/2012  . Full dentures   . GI bleed   . Glaucoma   . Heart murmur    since childhood, never has caused any problems  . History of rheumatic fever 11/04/2015  . Hyperlipidemia 04/25/2013  . Hypertension   . Malignant neoplasm of left lung ([place]) 02/08/2017  . RBBB 07/12/2017  . Sickle cell trait ([place])    no problems per patient   Past Surgical History:  Procedure Laterality Date  . BIOPSY  11/11/2017   Procedure: BIOPSY;  Surgeon: Lavena Bullion, DO;  Location: [facility] ENDOSCOPY;  Service: Gastroenterology;;  . BIOPSY  01/10/2018   Procedure: BIOPSY;  Surgeon: [name], MD;  Location: [place];  Service: Gastroenterology;;  . BIOPSY  06/22/2018   Procedure: BIOPSY;  Surgeon: Yetta Flock, MD;  Location: [facility] ENDOSCOPY;  Service: Gastroenterology;;  . cataract eye surgery  Right   . COLONOSCOPY  11/2009   hx polyps/[place]  . COLONOSCOPY N/A 06/22/2018   Procedure: COLONOSCOPY;  Surgeon: Yetta Flock, MD;  Location: [place];  Service: Gastroenterology;  Laterality: N/A;  . ENTEROSCOPY N/A 06/22/2018   Procedure: PUSH ENTEROSCOPY;  Surgeon: Yetta Flock, MD;  Location: [facility] ENDOSCOPY;  Service: Gastroenterology;  Laterality: N/A;  . ESOPHAGOGASTRODUODENOSCOPY N/A 11/11/2017   Procedure: ESOPHAGOGASTRODUODENOSCOPY (EGD);  Surgeon: Lavena Bullion, DO;  Location: [facility] ENDOSCOPY;  Service: Gastroenterology;  Laterality: N/A;  . ESOPHAGOGASTRODUODENOSCOPY (EGD) WITH PROPOFOL N/A 01/10/2018   Procedure: ESOPHAGOGASTRODUODENOSCOPY (EGD) WITH PROPOFOL;  Surgeon: [name], MD;  Location: [place];  Service: Gastroenterology;  Laterality: N/A;  . HOT HEMOSTASIS N/A 06/22/2018   Procedure: HOT HEMOSTASIS (ARGON PLASMA COAGULATION/BICAP);  Surgeon: Yetta Flock, MD;  Location: [facility] ENDOSCOPY;  Service: Gastroenterology;  Laterality: N/A;  . IR FLUORO GUIDE CV LINE RIGHT  01/16/2018  . IR FLUORO GUIDE CV LINE RIGHT  01/31/2018  . IR FLUORO GUIDE CV LINE RIGHT  10/06/2018  . IR FLUORO GUIDE CV LINE RIGHT  10/11/2018  . IR US GUIDE VASC ACCESS RIGHT  01/16/2018  . IR US GUIDE VASC ACCESS RIGHT  01/31/2018  . IR US GUIDE VASC ACCESS RIGHT  10/06/2018  . POLYPECTOMY  06/22/2018   Procedure: POLYPECTOMY;  Surgeon: Yetta Flock, MD;  Location: [place] ENDOSCOPY;  Service: Gastroenterology;;  . PROSTATE BIOPSY    . right eye surgery     to lower eye pressure  . RIGHT HEART CATH N/A 01/19/2018   Procedure: RIGHT HEART CATH;  Surgeon: Nelva Bush, MD;  Location: [place] CV LAB;  Service: Cardiovascular;  Laterality: N/A;  . TONSILLECTOMY    . VIDEO ASSISTED THORACOSCOPY (VATS)/ LOBECTOMY Right 12/26/2015  . wisdom tteeth ext      reports that he quit smoking about 7 years ago. His smoking use included cigarettes. He smoked 1.00 pack per day.  He has never used smokeless tobacco. He reports current alcohol use of about 1.0 standard drinks of alcohol per week. He reports that he does not use drugs. family history includes Cerebral aneurysm in his mother; Diabetes in his mother and sister; Lung cancer in his father. Allergies  Allergen Reactions  . Ambien [Zolpidem Tartrate] Other (See Comments)    Has hallucinations and Homicidal Ideations per Wife   . Lipitor [Atorvastatin]     Achy and tired      Outpatient Encounter Medications as of 11/09/2018  Medication Sig  . amLODipine (NORVASC) 5 MG tablet Take 1 tablet (5 mg total) by mouth daily.  [name] aspirin EC 81 MG EC tablet Take 1 tablet (81 mg total) by mouth daily.  . Brinzolamide-Brimonidine (SIMBRINZA) 1-0.2 % SUSP Place 2-3 drops into both eyes 3 (three) times daily.   . carvedilol (COREG) 3.125 MG tablet Take 1 tablet (3.125 mg total) by mouth 2 (two) times daily with a meal.  . hydrALAZINE (APRESOLINE) 25 MG tablet Take 1 tablet (25 mg total) by mouth every 8 (eight) hours.  . Netarsudil-Latanoprost (ROCKLATAN) 0.02-0.005 % SOLN Place 1 drop into both eyes at bedtime.   . Nutritional Supplements (FEEDING SUPPLEMENT, NEPRO CARB STEADY,) LIQD Take 237 mLs by mouth 2 (two) times daily.  . pantoprazole (PROTONIX) 40 MG tablet TAKE 1 TABLET BY MOUTH TWICE A DAY (Patient taking differently: Take 40 mg by mouth 2 (two) times daily. )  . predniSONE (DELTASONE) 20 MG tablet Take 3 tablets (60 mg total) by mouth daily with breakfast.  . XIIDRA 5 % SOLN Place 1 drop into both eyes 2 (two) times daily as needed (Dry eye).   Lorin Picket 1 GM 210 MG(Fe) tablet as directed.  . [DISCONTINUED] furosemide (LASIX) 80 MG tablet Take 1 tablet (80 mg total) by mouth daily. (Patient not taking: Reported on 10/24/2018)   No facility-administered encounter medications on file as of 11/09/2018.      REVIEW OF SYSTEMS  : All other systems reviewed and negative except where noted in the History of  Present Illness.   PHYSICAL EXAM: BP (!) 170/74 (BP Location: Left Arm, Patient Position: Sitting, Cuff Size: Normal)   Pulse 80   Temp 98.7 F (37.1 C)   Ht 5\' 8"  (1.727 m)   Wt 193 lb 2 oz (87.6 kg)   BMI 29.36 kg/m  General: Well developed black male in no acute distress Head: Normocephalic and atraumatic Eyes:  Sclerae anicteric, conjunctiva pink. Ears: Normal auditory acuity Lungs: Clear throughout to auscultation; no increased WOB. Heart: Regular rate and rhythm; systolic ejection murmur noted. Abdomen: Soft, non-distended.  BS present.  Non-tender. Rectal:  Heme positive in the ED. Musculoskeletal: Symmetrical with no gross deformities  Skin: No lesions on visible extremities Extremities: No edema  Neurological: Alert oriented x 4, grossly non-focal Psychological:  Alert and cooperative. 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  Now with drop in hemoglobin again to 5.9 g and Hemoccult positive stool and reports of melena.  Was in the ER last week, transfused 1 unit of packed red blood cells, and discharged since patient refused hospitalization.  Reports continued melena today.  I will recheck his CBC today and he needs a repeat enteroscopy sometime in the next 1 to 2 weeks.  May need eventual wireless capsule endoscopy as well as he may have deeper small bowel AVMs as well. *ESRD:  On HD MWF so needs procedure Tues or Thurs. *O2 dependent *Moderate aortic stenosis due to bicuspid aortic valve  **The risks, benefits, and alternatives to enteroscopy were discussed with the patient and he consents to proceed.   CC:  [name], MD

## 2018-11-09 NOTE — Patient Instructions (Signed)
We will call you to schedule your enteroscopy.

## 2018-11-10 ENCOUNTER — Emergency Department (HOSPITAL_COMMUNITY): Payer: Medicare Other

## 2018-11-10 ENCOUNTER — Inpatient Hospital Stay (HOSPITAL_COMMUNITY)
Admission: EM | Admit: 2018-11-10 | Discharge: 2018-11-12 | DRG: 377 | Disposition: A | Discharge status: Home / Self Care | Payer: Medicare Other | Attending: Internal Medicine | Admitting: Internal Medicine

## 2018-11-10 ENCOUNTER — Encounter (HOSPITAL_COMMUNITY): Payer: Self-pay | Admitting: Internal Medicine

## 2018-11-10 DIAGNOSIS — N2581 Secondary hyperparathyroidism of renal origin: Secondary | ICD-10-CM | POA: Diagnosis present

## 2018-11-10 DIAGNOSIS — Z9981 Dependence on supplemental oxygen: Secondary | ICD-10-CM

## 2018-11-10 DIAGNOSIS — E785 Hyperlipidemia, unspecified: Secondary | ICD-10-CM | POA: Diagnosis present

## 2018-11-10 DIAGNOSIS — N186 End stage renal disease: Secondary | ICD-10-CM | POA: Diagnosis present

## 2018-11-10 DIAGNOSIS — C649 Malignant neoplasm of unspecified kidney, except renal pelvis: Secondary | ICD-10-CM | POA: Diagnosis present

## 2018-11-10 DIAGNOSIS — K31811 Angiodysplasia of stomach and duodenum with bleeding: Secondary | ICD-10-CM | POA: Diagnosis not present

## 2018-11-10 DIAGNOSIS — I480 Paroxysmal atrial fibrillation: Secondary | ICD-10-CM | POA: Diagnosis not present

## 2018-11-10 DIAGNOSIS — C3411 Malignant neoplasm of upper lobe, right bronchus or lung: Secondary | ICD-10-CM | POA: Diagnosis present

## 2018-11-10 DIAGNOSIS — I350 Nonrheumatic aortic (valve) stenosis: Secondary | ICD-10-CM | POA: Diagnosis present

## 2018-11-10 DIAGNOSIS — Z801 Family history of malignant neoplasm of trachea, bronchus and lung: Secondary | ICD-10-CM

## 2018-11-10 DIAGNOSIS — D62 Acute posthemorrhagic anemia: Secondary | ICD-10-CM | POA: Diagnosis not present

## 2018-11-10 DIAGNOSIS — D573 Sickle-cell trait: Secondary | ICD-10-CM | POA: Diagnosis present

## 2018-11-10 DIAGNOSIS — Z7982 Long term (current) use of aspirin: Secondary | ICD-10-CM

## 2018-11-10 DIAGNOSIS — I120 Hypertensive chronic kidney disease with stage 5 chronic kidney disease or end stage renal disease: Secondary | ICD-10-CM | POA: Diagnosis present

## 2018-11-10 DIAGNOSIS — Z923 Personal history of irradiation: Secondary | ICD-10-CM

## 2018-11-10 DIAGNOSIS — D72829 Elevated white blood cell count, unspecified: Secondary | ICD-10-CM | POA: Diagnosis not present

## 2018-11-10 DIAGNOSIS — Z85118 Personal history of other malignant neoplasm of bronchus and lung: Secondary | ICD-10-CM

## 2018-11-10 DIAGNOSIS — Z87891 Personal history of nicotine dependence: Secondary | ICD-10-CM

## 2018-11-10 DIAGNOSIS — Z20828 Contact with and (suspected) exposure to other viral communicable diseases: Secondary | ICD-10-CM | POA: Diagnosis present

## 2018-11-10 DIAGNOSIS — K449 Diaphragmatic hernia without obstruction or gangrene: Secondary | ICD-10-CM | POA: Diagnosis present

## 2018-11-10 DIAGNOSIS — Z833 Family history of diabetes mellitus: Secondary | ICD-10-CM

## 2018-11-10 DIAGNOSIS — Z79899 Other long term (current) drug therapy: Secondary | ICD-10-CM

## 2018-11-10 DIAGNOSIS — K922 Gastrointestinal hemorrhage, unspecified: Secondary | ICD-10-CM | POA: Diagnosis not present

## 2018-11-10 DIAGNOSIS — J449 Chronic obstructive pulmonary disease, unspecified: Secondary | ICD-10-CM | POA: Diagnosis present

## 2018-11-10 DIAGNOSIS — K921 Melena: Secondary | ICD-10-CM | POA: Diagnosis not present

## 2018-11-10 DIAGNOSIS — I10 Essential (primary) hypertension: Secondary | ICD-10-CM | POA: Diagnosis present

## 2018-11-10 DIAGNOSIS — Z7952 Long term (current) use of systemic steroids: Secondary | ICD-10-CM

## 2018-11-10 DIAGNOSIS — D649 Anemia, unspecified: Secondary | ICD-10-CM

## 2018-11-10 DIAGNOSIS — Z992 Dependence on renal dialysis: Secondary | ICD-10-CM

## 2018-11-10 DIAGNOSIS — E8889 Other specified metabolic disorders: Secondary | ICD-10-CM | POA: Diagnosis present

## 2018-11-10 LAB — COMPREHENSIVE METABOLIC PANEL
ALT: 19 U/L (ref 0–44)
AST: 19 U/L (ref 15–41)
Albumin: 2.4 g/dL — ABNORMAL LOW (ref 3.5–5.0)
Alkaline Phosphatase: 69 U/L (ref 38–126)
Anion gap: 13 (ref 5–15)
BUN: 59 mg/dL — ABNORMAL HIGH (ref 8–23)
CO2: 29 mmol/L (ref 22–32)
Calcium: 7.7 mg/dL — ABNORMAL LOW (ref 8.9–10.3)
Chloride: 96 mmol/L — ABNORMAL LOW (ref 98–111)
Creatinine, Ser: 3.94 mg/dL — ABNORMAL HIGH (ref 0.61–1.24)
GFR calc Af Amer: 16 mL/min — ABNORMAL LOW (ref >60)
GFR calc non Af Amer: 13 mL/min — ABNORMAL LOW (ref >60)
Glucose, Bld: 152 mg/dL — ABNORMAL HIGH (ref 70–99)
Potassium: 4 mmol/L (ref 3.5–5.1)
Sodium: 138 mmol/L (ref 135–145)
Total Bilirubin: <0.1 mg/dL — ABNORMAL LOW (ref 0.3–1.2)
Total Protein: 4.8 g/dL — ABNORMAL LOW (ref 6.5–8.1)

## 2018-11-10 LAB — CBC
HCT: 23 % — ABNORMAL LOW (ref 39.0–52.0)
Hemoglobin: 6.9 g/dL — CL (ref 13.0–17.0)
MCH: 26.5 pg (ref 26.0–34.0)
MCHC: 30 g/dL (ref 30.0–36.0)
MCV: 88.5 fL (ref 80.0–100.0)
Platelets: 210 10*3/uL (ref 150–400)
RBC: 2.6 MIL/uL — ABNORMAL LOW (ref 4.22–5.81)
RDW: 21.2 % — ABNORMAL HIGH (ref 11.5–15.5)
WBC: 11.5 10*3/uL — ABNORMAL HIGH (ref 4.0–10.5)
nRBC: 0 % (ref 0.0–0.2)

## 2018-11-10 LAB — CBG MONITORING, ED: Glucose-Capillary: 112 mg/dL — ABNORMAL HIGH (ref 70–99)

## 2018-11-10 LAB — PREPARE RBC (CROSSMATCH)

## 2018-11-10 MED ORDER — ONDANSETRON HCL 4 MG/2ML IJ SOLN: 4.0000 mg | Freq: Four times a day (QID) | INTRAMUSCULAR | Status: DC | PRN

## 2018-11-10 MED ORDER — ACETAMINOPHEN 650 MG RE SUPP: 650.0000 mg | Freq: Four times a day (QID) | RECTAL | Status: DC | PRN

## 2018-11-10 MED ORDER — ACETAMINOPHEN 325 MG PO TABS: 650.0000 mg | ORAL_TABLET | Freq: Four times a day (QID) | ORAL | Status: DC | PRN

## 2018-11-10 MED ORDER — ONDANSETRON HCL 4 MG PO TABS: 4.0000 mg | ORAL_TABLET | Freq: Four times a day (QID) | ORAL | Status: DC | PRN

## 2018-11-10 MED ORDER — NETARSUDIL-LATANOPROST 0.02-0.005 % OP SOLN: 1.0000 [drp] | Freq: Every day | OPHTHALMIC | Status: DC

## 2018-11-10 MED ORDER — AMLODIPINE BESYLATE 5 MG PO TABS
5.0000 mg | ORAL_TABLET | Freq: Every day | ORAL | Status: DC
  Administered 2018-11-11 – 2018-11-12 (×2): 5 mg via ORAL
  Filled 2018-11-10 (×2): qty 1

## 2018-11-10 MED ORDER — PANTOPRAZOLE SODIUM 40 MG IV SOLR: 40.0000 mg | Freq: Two times a day (BID) | INTRAVENOUS | Status: DC

## 2018-11-10 MED ORDER — RENA-VITE PO TABS
1.0000 | ORAL_TABLET | Freq: Every day | ORAL | Status: DC
  Administered 2018-11-11: 1 via ORAL
  Filled 2018-11-10 (×2): qty 1

## 2018-11-10 MED ORDER — CARVEDILOL 3.125 MG PO TABS
3.1250 mg | ORAL_TABLET | Freq: Two times a day (BID) | ORAL | Status: DC
  Administered 2018-11-11 – 2018-11-12 (×3): 3.125 mg via ORAL
  Filled 2018-11-10 (×5): qty 1

## 2018-11-10 MED ORDER — NEPRO/CARBSTEADY PO LIQD
237.0000 mL | Freq: Two times a day (BID) | ORAL | Status: DC
  Filled 2018-11-10: qty 237

## 2018-11-10 MED ORDER — SODIUM CHLORIDE 0.9 % IV SOLN
10.0000 mL/h | Freq: Once | INTRAVENOUS | Status: AC
  Administered 2018-11-12: 08:00:00 via INTRAVENOUS

## 2018-11-10 MED ORDER — PREDNISONE 50 MG PO TABS
60.0000 mg | ORAL_TABLET | Freq: Every day | ORAL | Status: DC
  Administered 2018-11-11: 60 mg via ORAL
  Filled 2018-11-10: qty 3

## 2018-11-10 MED ORDER — HYDRALAZINE HCL 25 MG PO TABS
25.0000 mg | ORAL_TABLET | Freq: Three times a day (TID) | ORAL | Status: DC
  Administered 2018-11-10 – 2018-11-12 (×5): 25 mg via ORAL
  Filled 2018-11-10 (×5): qty 1

## 2018-11-10 NOTE — H&P (Addendum)
History and Physical    Andrew Guerra NUU:[id number] DOB: January 20, 1940 DOA: 11/10/2018  PCP: [name], MD  Patient coming from: Home.  Chief Complaint: Low hemoglobin.  HPI: Andrew Guerra is a 78 y.o. 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  Patient states he has no abdominal pain or any diarrhea vomiting.  Does not take any blood thinners.  Has been compliant with dialysis.  ED Course: In the ER repeat labs show hemoglobin of 6.9 platelets 210 creatinine 3.9 potassium 4 albumin 2.4.  Patient was given 1 unit of packed red blood cell transfusion started on Protonix and admitted for GI bleed and probably need for further work-up for his recurrent GI bleed.  COVID-19 test was negative.  Review of Systems: As per HPI, rest all negative.   Past Medical History:  Diagnosis Date  . 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  [name] Cataract    right eye - surgery to remove  . Elevated PSA 11/29/2016   Patient is followed by [facility].  Most recent PSA near 11.  He has had atypia on biopsy.  November 2008  . Essential hypertension, benign 06/08/2012  . Full dentures   . GI bleed   . Glaucoma   . Heart murmur    since childhood, never has caused any problems  . History of rheumatic fever 11/04/2015  . Hyperlipidemia 04/25/2013  . Hypertension   .  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 RBBB 07/12/2017  . Sickle cell trait ([place])    no problems per patient    Past Surgical History:  Procedure Laterality Date  . BIOPSY  11/11/2017   Procedure: BIOPSY;  Surgeon: Lavena Bullion, DO;  Location: [place] [facility]COPY;  Service: Gastroenterology;;  . BIOPSY  01/10/2018   Procedure: BIOPSY;  Surgeon: [name], MD;  Location: [place];  Service: Gastroenterology;;  . BIOPSY  06/22/2018   Procedure: BIOPSY;  Surgeon: Yetta Flock, MD;  Location: [facility] ENDOSCOPY;  Service: Gastroenterology;;  . cataract eye surgery Right   . COLONOSCOPY  11/2009   hx polyps/[place]  . COLONOSCOPY N/A 06/22/2018   Procedure: COLONOSCOPY;  Surgeon: Yetta Flock, MD;  Location: [place];  Service: Gastroenterology;  Laterality: N/A;  . ENTEROSCOPY N/A 06/22/2018   Procedure: PUSH ENTEROSCOPY;  Surgeon: Yetta Flock, MD;  Location: [facility] ENDOSCOPY;  Service: Gastroenterology;  Laterality: N/A;  . ESOPHAGOGASTRODUODENOSCOPY N/A 11/11/2017   Procedure: ESOPHAGOGASTRODUODENOSCOPY (EGD);  Surgeon: Lavena Bullion, DO;  Location: [facility] ENDOSCOPY;  Service: Gastroenterology;  Laterality: N/A;  . ESOPHAGOGASTRODUODENOSCOPY (EGD) WITH PROPOFOL N/A 01/10/2018   Procedure: ESOPHAGOGASTRODUODENOSCOPY (EGD) WITH PROPOFOL;  Surgeon: [name], MD;  Location: [place];  Service: Gastroenterology;  Laterality: N/A;  . HOT HEMOSTASIS N/A 06/22/2018   Procedure: HOT HEMOSTASIS (ARGON PLASMA COAGULATION/BICAP);  Surgeon: Yetta Flock,  MD;  Location: [place];  Service: Gastroenterology;  Laterality: N/A;  . IR FLUORO GUIDE CV LINE RIGHT  01/16/2018  . IR FLUORO GUIDE CV LINE RIGHT  01/31/2018  . IR FLUORO GUIDE CV LINE RIGHT  10/06/2018  . IR FLUORO GUIDE CV LINE RIGHT  10/11/2018  . IR US GUIDE VASC ACCESS RIGHT  01/16/2018  . IR US GUIDE VASC ACCESS RIGHT  01/31/2018  . IR US GUIDE VASC ACCESS RIGHT  10/06/2018  .  POLYPECTOMY  06/22/2018   Procedure: POLYPECTOMY;  Surgeon: Yetta Flock, MD;  Location: [place];  Service: Gastroenterology;;  . PROSTATE BIOPSY    . right eye surgery     to lower eye pressure  . RIGHT HEART CATH N/A 01/19/2018   Procedure: RIGHT HEART CATH;  Surgeon: Nelva Bush, MD;  Location: [place] CV [facility];  Service: Cardiovascular;  Laterality: N/A;  . TONSILLECTOMY    . VIDEO ASSISTED THORACOSCOPY (VATS)/ LOBECTOMY Right 12/26/2015  . wisdom tteeth ext       reports that he quit smoking about 7 years ago. His smoking use included cigarettes. He smoked 1.00 pack per day. He has never used smokeless tobacco. He reports current alcohol use of about 1.0 standard drinks of alcohol per week. He reports that he does not use drugs.  Allergies  Allergen Reactions  . Ambien [Zolpidem Tartrate] Other (See Comments)    Has hallucinations and Homicidal Ideations per Wife   . Lipitor [Atorvastatin] Other (See Comments)    Achy and tired    Family History  Problem Relation Age of Onset  . Diabetes Mother   . Cerebral aneurysm Mother   . Lung cancer Father   . Diabetes Sister   . Colon cancer Neg Hx   . Colon polyps Neg Hx   . Esophageal cancer Neg Hx   . Rectal cancer Neg Hx   . Stomach cancer Neg Hx     Prior to Admission medications   Medication Sig Start Date End Date Taking? Authorizing Provider  amLODipine (NORVASC) 5 MG tablet Take 1 tablet (5 mg total) by mouth daily. 10/13/18  Yes Shelly Coss, MD  aspirin EC 81 MG EC tablet Take 1 tablet (81 mg total) by mouth daily. 10/13/18  Yes Shelly Coss, MD  Brinzolamide-Brimonidine [facility]) 1-0.2 % SUSP Place 2-3 drops into both eyes 3 (three) times daily.    Yes [provider]  carvedilol (COREG) 3.125 MG tablet Take 1 tablet (3.125 mg total) by mouth 2 (two) times daily with a meal. 10/13/18  Yes Adhikari, Amrit, MD  hydrALAZINE (APRESOLINE) 25 MG tablet Take 1 tablet (25 mg total) by mouth every  8 (eight) hours. 10/13/18  Yes Shelly Coss, MD  Lifitegrast [facility]) 5 % SOLN Place 1 drop into both eyes 2 (two) times daily as needed (dry eyes).   Yes [provider]  multivitamin (RENA-VIT) TABS tablet Take 1 tablet by mouth daily.   Yes [provider]  Netarsudil-Latanoprost (ROCKLATAN) 0.02-0.005 % SOLN Place 1 drop into both eyes at bedtime.    Yes Marylynn Pearson, MD  Nutritional Supplements (FEEDING SUPPLEMENT, NEPRO CARB STEADY,) LIQD Take 237 mLs by mouth 2 (two) times daily.   Yes [provider]  pantoprazole (PROTONIX) 40 MG tablet TAKE 1 TABLET BY MOUTH TWICE A DAY Patient taking differently: Take 40 mg by mouth 2 (two) times daily.  09/24/18  Yes [name], MD  predniSONE (DELTASONE) 20 MG tablet Take 3 tablets (60 mg total)  by mouth daily with breakfast. 10/13/18 11/12/18 Yes Adhikari, Tamsen Meek, MD  ferric citrate (AURYXIA) 1 GM 210 MG(Fe) tablet Take 420 mg by mouth 3 (three) times daily with meals.    [provider]    Physical Exam: Constitutional: Moderately built and nourished. Vitals:   11/10/18 1900 11/10/18 1930 11/10/18 2012 11/10/18 2031  BP: (!) 175/94 (!) 177/95 (!) 188/98 (!) 218/94  Pulse: 81 85 79 79  Resp: 17 16 18 13   Temp:   (!) 97.5 F (36.4 C) (!) 97.4 F (36.3 C)  TempSrc:   Oral Oral  SpO2: 97% 98% 97% 98%   Eyes: Anicteric no pallor. ENMT: No discharge from the ears eyes nose and mouth. Neck: No mass felt.  No neck rigidity. Respiratory: No rhonchi or crepitations. Cardiovascular: S1-S2 heard. Abdomen: Soft nontender bowel sounds present. Musculoskeletal: No edema. Skin: No rash. Neurologic: Alert awake oriented to time place and person.  Moves all extremities. Psychiatric: Appears normal.  Normal affect.   Labs on Admission: I have personally reviewed following labs and imaging studies  CBC: Recent Labs  Lab 11/09/18 1516 11/10/18 1211  WBC 9.4 11.5*  HGB 6.5 Repeated and verified X2.* 6.9*   HCT 20.1 Repeated and verified X2.* 23.0*  MCV 84.3 88.5  PLT 193.0 628   Basic Metabolic Panel: Recent Labs  Lab 11/10/18 1211  NA 138  K 4.0  CL 96*  CO2 29  GLUCOSE 152*  BUN 59*  CREATININE 3.94*  CALCIUM 7.7*   GFR: Estimated Creatinine Clearance: 16.1 mL/min (A) (by C-G formula based on SCr of 3.94 mg/dL (H)). Liver Function Tests: Recent Labs  Lab 11/10/18 1211  AST 19  ALT 19  ALKPHOS 69  BILITOT <0.1*  PROT 4.8*  ALBUMIN 2.4*   No results for input(s): LIPASE, AMYLASE in the last 168 hours. No results for input(s): AMMONIA in the last 168 hours. Coagulation Profile: No results for input(s): INR, PROTIME in the last 168 hours. Cardiac Enzymes: No results for input(s): CKTOTAL, CKMB, CKMBINDEX, TROPONINI in the last 168 hours. BNP (last 3 results) No results for input(s): PROBNP in the last 8760 hours. HbA1C: No results for input(s): HGBA1C in the last 72 hours. CBG: No results for input(s): GLUCAP in the last 168 hours. Lipid Profile: No results for input(s): CHOL, HDL, LDLCALC, TRIG, CHOLHDL, LDLDIRECT in the last 72 hours. Thyroid Function Tests: No results for input(s): TSH, T4TOTAL, FREET4, T3FREE, THYROIDAB in the last 72 hours. Anemia Panel: No results for input(s): VITAMINB12, FOLATE, FERRITIN, TIBC, IRON, RETICCTPCT in the last 72 hours. Urine analysis:    Component Value Date/Time   COLORURINE YELLOW 10/04/2018 1425   APPEARANCEUR HAZY (A) 10/04/2018 1425   LABSPEC 1.015 10/04/2018 1425   PHURINE 6.0 10/04/2018 1425   GLUCOSEU 50 (A) 10/04/2018 1425   HGBUR SMALL (A) 10/04/2018 1425   BILIRUBINUR NEGATIVE 10/04/2018 1425   KETONESUR NEGATIVE 10/04/2018 1425   PROTEINUR >=300 (A) 10/04/2018 1425   NITRITE NEGATIVE 10/04/2018 1425   LEUKOCYTESUR MODERATE (A) 10/04/2018 1425   Sepsis Labs: @LABRCNTIP (procalcitonin:4,lacticidven:4) )No results found for this or any previous visit (from the past 240 hour(s)).   Radiological Exams on  Admission: Dg Chest Port 1 View  Result Date: 11/10/2018 CLINICAL DATA:  Dialysis EXAM: PORTABLE CHEST 1 VIEW COMPARISON:  10/04/2018 FINDINGS: Interval placement of a large-bore right neck multi lumen vascular catheter. Unchanged postoperative findings of the right lung with bibasilar atelectasis, consolidation, or scarring and a right pleural effusion. Cardiomegaly. IMPRESSION: 1.  Interval placement of a large-bore right neck multi lumen vascular catheter. 2. Unchanged postoperative findings of the right lung with bibasilar atelectasis, consolidation, or scarring and a right pleural effusion. Electronically Signed   By: Eddie Candle M.D.   On: 11/10/2018 19:51    EKG: Independently reviewed.  Normal sinus rhythm RBBB.  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 Acute GI bleed with history of recurrent GI bleed from AVMs -we will keep patient on Protonix infusion and has received 1 unit of PRBC.  Follow CBC.  Keep n.p.o. in anticipation of procedure please reconsult [facility] in a.m.  Given that patient has aortic stenosis with angiodysplasia not sure if patient has Heyde's syndrome. 2. 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 3. Paroxysmal atrial fibrillation not on anticoagulation.  Presently in sinus rhythm. 4. Acute blood loss anemia follow CBC after transfusion. 5. Hypertension on hydralazine Coreg and amlodipine.   DVT prophylaxis: SCDs. Code Status: Full code. Family Communication: Discussed with patient. Disposition Plan: Home. Consults called: ER physician discussed with gastroenterology on call for [facility] and nephrology. Admission status: Observation.   Rise Patience MD [facility] Pager [phone].  If 7PM-7AM, please contact night-coverage [email or web address] Password [facility]  11/10/2018, 9:13 PM

## 2018-11-10 NOTE — Progress Notes (Signed)
Reviewed. I agree with documentation including the assessment and plan.  Nehemie Casserly L. Tarri Glenn, MD, MPH

## 2018-11-10 NOTE — ED Notes (Signed)
Patient on 2L O2

## 2018-11-10 NOTE — ED Provider Notes (Addendum)
[facility] EMERGENCY DEPARTMENT Provider Note   CSN: [id number] Arrival date & time: 11/10/18  1125     History   Chief Complaint Chief Complaint  Patient presents with   Abnormal Lab    HPI Andrew Guerra is a 78 y.o. male.     Andrew Guerra is a 78 y.o. 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  Patient was called by his GI doctor after lab work from office visit yesterday returned with hemoglobin of 6.5.  Patient was initially seen in the ED on 10/16 for evaluation of black stools and received 1 unit blood transfusion but refused admission at this time.  Followed up outpatient with PA Janett Billow Zager with low our GI yesterday due to ongoing black stools.  Patient reports that he has had some fatigue but no syncopal symptoms.  No chest pain or shortness of breath.  He denies any focal abdominal pain.  No vomiting or hematemesis.  He has not had any bright red blood in his stools.  Reports that he missed dialysis today due to coming to the emergency department, had complete dialysis on Wednesday without complication.  He is not on any blood thinners.  No other aggravating or alleviating factors.     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   Cataract    right eye - surgery to remove   Elevated PSA 11/29/2016   [facility] is followed [facility].  Most recent PSA near 11.  He has had atypia on biopsy.  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[place])    no problems per patient    Patient Active Problem List   Diagnosis Date Noted   AVM (arteriovenous malformation) of small bowel, acquired 11/09/2018   Acute on chronic respiratory failure ([place]) 10/06/2018   Acute on chronic renal failure ([place]) 10/04/2018   AKI (acute kidney injury) ([place])    Acute on chronic congestive heart failure ([place])    Guaiac + stool    Benign neoplasm of colon    Pulmonary hypertension, unspecified ([place]) 05/30/2018   Chronic diastolic heart failure ([place]) 05/30/2018   Acute renal failure (HCC)    PAF (paroxysmal atrial fibrillation) ([place])    Pressure injury of skin 01/17/2018   Intestinal metaplasia of gastric mucosa    Iron deficiency anemia    Anasarca    Malnutrition of moderate degree 01/10/2018   GI bleed 01/09/2018   Acute respiratory failure with hypoxia ([place]) 11/12/2017   Gastritis and gastroduodenitis    Acute gastric ulcer without hemorrhage or perforation    Duodenal ulcer    Paraesophageal hernia    Upper GI bleeding 11/09/2017   CKD (chronic kidney disease), stage III 11/09/2017   Normocytic anemia 11/09/2017   Melena    RBBB 07/12/2017   Pulmonary nodule 02/17/2017  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 02/08/2017   Malignant neoplasm of left lung ([place]) 02/08/2017   Elevated PSA 11/29/2016   Aortic valve stenosis, moderate 12/26/2015   BPH (benign prostatic hyperplasia) 12/18/2015   Hypertension 12/18/2015   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[place]) 12/18/2015   Heart murmur 11/04/2015   History of active rheumatic fever 11/04/2015   Solitary pulmonary nodule 08/31/2013   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   Thyroid nodule 08/31/2013   Hyperlipidemia 04/25/2013   Essential hypertension, benign 06/08/2012    Past Surgical  History:  Procedure Laterality Date   BIOPSY  11/11/2017   Procedure: BIOPSY;  Surgeon: Lavena Bullion, DO;  Location: [facility];  Service: Gastroenterology;;   BIOPSY  01/10/2018   Procedure: BIOPSY;  Surgeon: [name], MD;  Location: [place];  Service: Gastroenterology;;   BIOPSY  06/22/2018   Procedure: BIOPSY;  Surgeon: Yetta Flock, MD;  Location: [facility] ENDOSCOPY;  Service: Gastroenterology;;   cataract eye surgery Right    COLONOSCOPY  11/2009   hx polyps/[place]   COLONOSCOPY N/A 06/22/2018   Procedure: COLONOSCOPY;  Surgeon: Yetta Flock, MD;  Location: [place];  Service: Gastroenterology;  Laterality: N/A;   ENTEROSCOPY N/A 06/22/2018   Procedure: PUSH ENTEROSCOPY;  Surgeon: Yetta Flock, MD;  Location: [facility] ENDOSCOPY;  Service: Gastroenterology;  Laterality: N/A;   ESOPHAGOGASTRODUODENOSCOPY N/A 11/11/2017   Procedure: ESOPHAGOGASTRODUODENOSCOPY (EGD);  Surgeon: Lavena Bullion, DO;  Location[facility] ENDOSCOPY;  Service: Gastroenterology;  Laterality: N/A;   ESOPHAGOGASTRODUODENOSCOPY (EGD) WITH PROPOFOL N/A 01/10/2018   Procedure: ESOPHAGOGASTRODUODENOSCOPY (EGD) WITH PROPOFOL;  Surgeon: [name], MD;  Location: [place];  Service: Gastroenterology;  Laterality: N/A;   HOT HEMOSTASIS N/A 06/22/2018   Procedure: HOT HEMOSTASIS (ARGON PLASMA COAGULATION/BICAP);  Surgeon: Yetta Flock, MD;  Location: [facility] ENDOSCOPY;  Service: Gastroenterology;  Laterality: N/A;   IR FLUORO GUIDE CV LINE RIGHT  01/16/2018   IR FLUORO GUIDE CV LINE RIGHT  01/31/2018   IR FLUORO GUIDE CV LINE RIGHT  10/06/2018   IR FLUORO GUIDE CV LINE RIGHT  10/11/2018   IR US GUIDE VASC ACCESS RIGHT  01/16/2018   IR US GUIDE VASC ACCESS RIGHT  01/31/2018   IR US GUIDE VASC ACCESS RIGHT  10/06/2018   POLYPECTOMY  06/22/2018   Procedure: POLYPECTOMY;  Surgeon: Yetta Flock, MD;  Location: [place];  Service: Gastroenterology;;   PROSTATE  BIOPSY     right eye surgery     to lower eye pressure   RIGHT HEART CATH N/A 01/19/2018   Procedure: RIGHT HEART CATH;  Surgeon: Nelva Bush, MD;  Location: [place] CV [facility];  Service: Cardiovascular;  Laterality: N/A;   TONSILLECTOMY     VIDEO ASSISTED THORACOSCOPY (VATS)/ LOBECTOMY Right 12/26/2015   wisdom tteeth ext          Home Medications    Prior to Admission medications   Medication Sig Start Date End Date Taking? Authorizing Provider  amLODipine (NORVASC) 5 MG tablet Take 1 tablet (5 mg total) by mouth daily. 10/13/18   Shelly Coss, MD  aspirin EC 81 MG EC tablet Take 1 tablet (81 mg total) by mouth daily. 10/13/18   Shelly Coss, MD  AURYXIA 1 GM 210 MG(Fe) tablet as directed. 11/06/18   [provider]  Brinzolamide-Brimonidine (SIMBRINZA) 1-0.2 % SUSP Place 2-3 drops into both eyes 3 (three) times daily.     [provider]  carvedilol (COREG) 3.125 MG tablet Take  1 tablet (3.125 mg total) by mouth 2 (two) times daily with a meal. 10/13/18   Shelly Coss, MD  hydrALAZINE (APRESOLINE) 25 MG tablet Take 1 tablet (25 mg total) by mouth every 8 (eight) hours. 10/13/18   Shelly Coss, MD  Netarsudil-Latanoprost (ROCKLATAN) 0.02-0.005 % SOLN Place 1 drop into both eyes at bedtime.     Marylynn Pearson, MD  Nutritional Supplements (FEEDING SUPPLEMENT, NEPRO CARB STEADY,) LIQD Take 237 mLs by mouth 2 (two) times daily.    [provider]  pantoprazole (PROTONIX) 40 MG tablet TAKE 1 TABLET BY MOUTH TWICE A DAY Patient taking differently: Take 40 mg by mouth 2 (two) times daily.  09/24/18   [name], MD  predniSONE (DELTASONE) 20 MG tablet Take 3 tablets (60 mg total) by mouth daily with breakfast. 10/13/18 11/12/18  Shelly Coss, MD  XIIDRA 5 % SOLN Place 1 drop into both eyes 2 (two) times daily as needed (Dry eye).  03/27/18   [provider]    Family History Family History  Problem Relation Age of Onset   Diabetes  Mother    Cerebral aneurysm Mother    Lung cancer Father    Diabetes Sister    Colon cancer Neg Hx    Colon polyps Neg Hx    Esophageal cancer Neg Hx    Rectal cancer Neg Hx    Stomach cancer Neg Hx     Social History Social History   Tobacco Use   Smoking status: Former Smoker    Packs/day: 1.00    Types: Cigarettes    Quit date: 01/19/2011    Years since quitting: 7.8   Smokeless tobacco: Never Used  Substance Use Topics   Alcohol use: Yes    Alcohol/week: 1.0 standard drinks    Types: 1 Shots of liquor per week    Comment: rarely   Drug use: No     Allergies   Ambien [zolpidem tartrate] and Lipitor [atorvastatin]   Review of Systems Review of Systems  Constitutional: Positive for fatigue. Negative for chills and fever.  HENT: Negative.   Respiratory: Negative for cough and shortness of breath.   Cardiovascular: Negative for chest pain.  Gastrointestinal: Positive for blood in stool. Negative for abdominal pain, diarrhea, nausea and vomiting.  Genitourinary: Negative for dysuria and frequency.  Musculoskeletal: Negative for arthralgias and myalgias.  Skin: Negative for color change and rash.  Neurological: Negative for dizziness, syncope and light-headedness.     Physical Exam Updated Vital Signs BP (!) 179/84    Pulse 78    Temp 99 F (37.2 C) (Oral)    Resp 16    SpO2 99%   Physical Exam Vitals signs and nursing note reviewed.  Constitutional:      General: He is not in acute distress.    Appearance: Normal appearance. He is well-developed and normal weight. He is not ill-appearing or diaphoretic.     Comments: Alert, sitting comfortably in bed, no acute distress  HENT:     Head: Normocephalic and atraumatic.  Eyes:     General:        Right eye: No discharge.        Left eye: No discharge.     Pupils: Pupils are equal, round, and reactive to light.     Comments: Conjunctivae slightly pale  Neck:     Musculoskeletal: Neck supple.    Cardiovascular:     Rate and Rhythm: Normal rate and regular rhythm.  Heart sounds: Normal heart sounds. No murmur. No friction rub. No gallop.   Pulmonary:     Effort: Pulmonary effort is normal. No respiratory distress.     Breath sounds: Normal breath sounds. No wheezing or rales.     Comments: Respirations equal and unlabored, patient able to speak in full sentences, lungs clear to auscultation bilaterally Abdominal:     General: Bowel sounds are normal. There is no distension.     Palpations: Abdomen is soft. There is no mass.     Tenderness: There is no abdominal tenderness. There is no guarding.     Comments: Abdomen soft, nondistended, nontender to palpation in all quadrants without guarding or peritoneal signs  Musculoskeletal:        General: No deformity.  Skin:    General: Skin is warm and dry.     Capillary Refill: Capillary refill takes less than 2 seconds.  Neurological:     Mental Status: He is alert.     Coordination: Coordination normal.     Comments: Speech is clear, able to follow commands Moves extremities without ataxia, coordination intact  Psychiatric:        Mood and Affect: Mood normal.        Behavior: Behavior normal.      ED Treatments / Results  Labs (all labs ordered are listed, but only abnormal results are displayed) Labs Reviewed  COMPREHENSIVE METABOLIC PANEL - Abnormal; Notable for the following components:      Result Value   Chloride 96 (*)    Glucose, Bld 152 (*)    BUN 59 (*)    Creatinine, Ser 3.94 (*)    Calcium 7.7 (*)    Total Protein 4.8 (*)    Albumin 2.4 (*)    Total Bilirubin <0.1 (*)    GFR calc non Af Amer 13 (*)    GFR calc Af Amer 16 (*)    All other components within normal limits  CBC - Abnormal; Notable for the following components:   WBC 11.5 (*)    RBC 2.60 (*)    Hemoglobin 6.9 (*)    HCT 23.0 (*)    RDW 21.2 (*)    All other components within normal limits  SARS CORONAVIRUS 2 (TAT 6-24 HRS)  POC  OCCULT BLOOD, ED  TYPE AND SCREEN  PREPARE RBC (CROSSMATCH)    EKG EKG Interpretation  Date/Time:  Friday November 10 2018 18:48:56 EDT Ventricular Rate:  84 PR Interval:    QRS Duration: 161 QT Interval:  430 QTC Calculation: 509 R Axis:   -37 Text Interpretation:  Sinus rhythm Probable left atrial enlargement Right bundle branch block No significant change since last tracing Confirmed by Blanchie Dessert [phone]) on 11/10/2018 6:59:34 PM   Radiology No results found.  Procedures .Critical Care Performed by: Jacqlyn Larsen, PA-C Authorized by: Jacqlyn Larsen, PA-C   Critical care provider statement:    Critical care time (minutes):  45   Critical care time was exclusive of:  Separately billable procedures and treating other patients   Critical care was necessary to treat or prevent imminent or life-threatening deterioration of the following conditions:  Circulatory failure   Critical care was time spent personally by me on the following activities:  Blood draw for specimens, development of treatment plan with patient or surrogate, discussions with consultants, evaluation of patient's response to treatment, examination of patient, obtaining history from patient or surrogate, review of old charts, re-evaluation of patient's condition, ordering  and review of radiographic studies, ordering and review of laboratory studies and ordering and performing treatments and interventions   I assumed direction of critical care for this patient from another provider in my specialty: no     (including critical care time)  Medications Ordered in ED Medications  0.9 %  sodium chloride infusion (has no administration in time range)     Initial Impression / Assessment and Plan / ED Course  I have reviewed the triage vital signs and the nursing notes.  Pertinent labs & imaging results that were available during my care of the patient were reviewed by me and considered in my medical decision  making (see chart for details).  78 year old male with history of recurrent GI bleeding presents for evaluation of low hemoglobin, sent by GI doctor after lab work from appointment yesterday returned with hemoglobin of 6.5.  Patient had ED visit for the same on 10/16, but declined admission at this time.  Continues to have black stools.  Reports fatigue but no syncope.  No chest pain or shortness of breath.  No abdominal pain or hematemesis.  Patient is on hemodialysis, missed dialysis today, but had complete dialysis treatment on Wednesday.  Reports some increased fluid buildup on his lower extremities but otherwise asymptomatic.  Hemoglobin on arrival of 6.9, slight leukocytosis of 11.5.  Creatinine at baseline, normal potassium.  Type and screen pending.  Given that patient had heme positive stools on the 16th is continued to have the same melena do not feel that repeating this will be beneficial.  Patient does not have any abdominal pain do not think that imaging is indicated.  Patient is agreeable to admission for treatment of his anemia and GI bleeding at this time.  Will order 1 unit blood transfusion and consult GI and nephrology.  Patient will be admitted to the medicine service.  Case discussed with Dr. Ardis Hughs with [facility] who will see patient in consult.  Case discussed with nephrology who will add patient to list for inpatient dialysis.  Patient discussed with Dr. Hal Hope with [facility] who will see and admit the patient.  Final Clinical Impressions(s) / ED Diagnoses   Final diagnoses:  Symptomatic anemia  Gastrointestinal hemorrhage, unspecified gastrointestinal hemorrhage type  ESRD on hemodialysis [facility])    ED Discharge Orders    None       Janet Berlin 11/11/18 0000    Blanchie Dessert, MD 11/11/18 1524    Jacqlyn Larsen, PA-C 11/24/18 2135    Blanchie Dessert, MD 11/25/18 1545

## 2018-11-10 NOTE — ED Triage Notes (Addendum)
Pt has a re current issue of hemoglobin of 6.5 - pt was also seen on 10/16 for black stool and received blood transfusion. Pt was called from his pcp to come back to ED due to low blood count.  Due for dialysis today unable to make it due to coming here.

## 2018-11-11 DIAGNOSIS — N186 End stage renal disease: Secondary | ICD-10-CM | POA: Diagnosis present

## 2018-11-11 DIAGNOSIS — E8889 Other specified metabolic disorders: Secondary | ICD-10-CM | POA: Diagnosis present

## 2018-11-11 DIAGNOSIS — Z801 Family history of malignant neoplasm of trachea, bronchus and lung: Secondary | ICD-10-CM | POA: Diagnosis not present

## 2018-11-11 DIAGNOSIS — N2581 Secondary hyperparathyroidism of renal origin: Secondary | ICD-10-CM | POA: Diagnosis present

## 2018-11-11 DIAGNOSIS — Z87891 Personal history of nicotine dependence: Secondary | ICD-10-CM | POA: Diagnosis not present

## 2018-11-11 DIAGNOSIS — Z20828 Contact with and (suspected) exposure to other viral communicable diseases: Secondary | ICD-10-CM | POA: Diagnosis present

## 2018-11-11 DIAGNOSIS — C649 Malignant neoplasm of unspecified kidney, except renal pelvis: Secondary | ICD-10-CM | POA: Diagnosis present

## 2018-11-11 DIAGNOSIS — E785 Hyperlipidemia, unspecified: Secondary | ICD-10-CM | POA: Diagnosis present

## 2018-11-11 DIAGNOSIS — D62 Acute posthemorrhagic anemia: Secondary | ICD-10-CM | POA: Diagnosis present

## 2018-11-11 DIAGNOSIS — K922 Gastrointestinal hemorrhage, unspecified: Secondary | ICD-10-CM

## 2018-11-11 DIAGNOSIS — I480 Paroxysmal atrial fibrillation: Secondary | ICD-10-CM | POA: Diagnosis present

## 2018-11-11 DIAGNOSIS — I350 Nonrheumatic aortic (valve) stenosis: Secondary | ICD-10-CM | POA: Diagnosis present

## 2018-11-11 DIAGNOSIS — Z85118 Personal history of other malignant neoplasm of bronchus and lung: Secondary | ICD-10-CM | POA: Diagnosis not present

## 2018-11-11 DIAGNOSIS — Z79899 Other long term (current) drug therapy: Secondary | ICD-10-CM | POA: Diagnosis not present

## 2018-11-11 DIAGNOSIS — D573 Sickle-cell trait: Secondary | ICD-10-CM | POA: Diagnosis present

## 2018-11-11 DIAGNOSIS — Z833 Family history of diabetes mellitus: Secondary | ICD-10-CM | POA: Diagnosis not present

## 2018-11-11 DIAGNOSIS — Z992 Dependence on renal dialysis: Secondary | ICD-10-CM | POA: Diagnosis not present

## 2018-11-11 DIAGNOSIS — D72829 Elevated white blood cell count, unspecified: Secondary | ICD-10-CM | POA: Diagnosis not present

## 2018-11-11 DIAGNOSIS — Z9981 Dependence on supplemental oxygen: Secondary | ICD-10-CM | POA: Diagnosis not present

## 2018-11-11 DIAGNOSIS — K449 Diaphragmatic hernia without obstruction or gangrene: Secondary | ICD-10-CM | POA: Diagnosis present

## 2018-11-11 DIAGNOSIS — I120 Hypertensive chronic kidney disease with stage 5 chronic kidney disease or end stage renal disease: Secondary | ICD-10-CM | POA: Diagnosis present

## 2018-11-11 DIAGNOSIS — Z7952 Long term (current) use of systemic steroids: Secondary | ICD-10-CM | POA: Diagnosis not present

## 2018-11-11 DIAGNOSIS — K921 Melena: Secondary | ICD-10-CM | POA: Diagnosis present

## 2018-11-11 DIAGNOSIS — J449 Chronic obstructive pulmonary disease, unspecified: Secondary | ICD-10-CM | POA: Diagnosis present

## 2018-11-11 DIAGNOSIS — Z7982 Long term (current) use of aspirin: Secondary | ICD-10-CM | POA: Diagnosis not present

## 2018-11-11 DIAGNOSIS — K31811 Angiodysplasia of stomach and duodenum with bleeding: Secondary | ICD-10-CM | POA: Diagnosis present

## 2018-11-11 LAB — BASIC METABOLIC PANEL
Anion gap: 12 (ref 5–15)
Anion gap: 15 (ref 5–15)
BUN: 63 mg/dL — ABNORMAL HIGH (ref 8–23)
BUN: 64 mg/dL — ABNORMAL HIGH (ref 8–23)
CO2: 30 mmol/L (ref 22–32)
CO2: 26 mmol/L (ref 22–32)
Calcium: 7.7 mg/dL — ABNORMAL LOW (ref 8.9–10.3)
Calcium: 7.7 mg/dL — ABNORMAL LOW (ref 8.9–10.3)
Chloride: 97 mmol/L — ABNORMAL LOW (ref 98–111)
Chloride: 98 mmol/L (ref 98–111)
Creatinine, Ser: 3.87 mg/dL — ABNORMAL HIGH (ref 0.61–1.24)
Creatinine, Ser: 3.94 mg/dL — ABNORMAL HIGH (ref 0.61–1.24)
GFR calc Af Amer: 16 mL/min — ABNORMAL LOW (ref >60)
GFR calc Af Amer: 16 mL/min — ABNORMAL LOW (ref >60)
GFR calc non Af Amer: 14 mL/min — ABNORMAL LOW (ref >60)
GFR calc non Af Amer: 13 mL/min — ABNORMAL LOW (ref >60)
Glucose, Bld: 80 mg/dL (ref 70–99)
Glucose, Bld: 75 mg/dL (ref 70–99)
Potassium: 4.1 mmol/L (ref 3.5–5.1)
Potassium: 4.2 mmol/L (ref 3.5–5.1)
Sodium: 139 mmol/L (ref 135–145)
Sodium: 139 mmol/L (ref 135–145)

## 2018-11-11 LAB — CBC
HCT: 25.4 % — ABNORMAL LOW (ref 39.0–52.0)
HCT: 26.2 % — ABNORMAL LOW (ref 39.0–52.0)
Hemoglobin: 8.1 g/dL — ABNORMAL LOW (ref 13.0–17.0)
Hemoglobin: 8.2 g/dL — ABNORMAL LOW (ref 13.0–17.0)
MCH: 27.2 pg (ref 26.0–34.0)
MCH: 27.5 pg (ref 26.0–34.0)
MCHC: 31.9 g/dL (ref 30.0–36.0)
MCHC: 31.3 g/dL (ref 30.0–36.0)
MCV: 85.2 fL (ref 80.0–100.0)
MCV: 87.9 fL (ref 80.0–100.0)
Platelets: 199 10*3/uL (ref 150–400)
Platelets: 189 10*3/uL (ref 150–400)
RBC: 2.98 MIL/uL — ABNORMAL LOW (ref 4.22–5.81)
RBC: 2.98 MIL/uL — ABNORMAL LOW (ref 4.22–5.81)
RDW: 19.1 % — ABNORMAL HIGH (ref 11.5–15.5)
RDW: 19.6 % — ABNORMAL HIGH (ref 11.5–15.5)
WBC: 11.4 10*3/uL — ABNORMAL HIGH (ref 4.0–10.5)
WBC: 11.1 10*3/uL — ABNORMAL HIGH (ref 4.0–10.5)
nRBC: 0 % (ref 0.0–0.2)
nRBC: 0 % (ref 0.0–0.2)

## 2018-11-11 LAB — GLUCOSE, CAPILLARY
Glucose-Capillary: 81 mg/dL (ref 70–99)
Glucose-Capillary: 89 mg/dL (ref 70–99)

## 2018-11-11 LAB — SARS CORONAVIRUS 2 (TAT 6-24 HRS): SARS Coronavirus 2: NEGATIVE

## 2018-11-11 MED ORDER — SODIUM CHLORIDE 0.9 % IV SOLN: 125.0000 mg | INTRAVENOUS | Status: DC

## 2018-11-11 MED ORDER — DOXERCALCIFEROL 4 MCG/2ML IV SOLN: 2.0000 ug | INTRAVENOUS | Status: DC

## 2018-11-11 MED ORDER — SODIUM CHLORIDE 0.9 % IV SOLN: INTRAVENOUS | Status: DC

## 2018-11-11 MED ORDER — HEPARIN SODIUM (PORCINE) 1000 UNIT/ML IJ SOLN
INTRAMUSCULAR | Status: AC
  Filled 2018-11-11: qty 4

## 2018-11-11 MED ORDER — DOXERCALCIFEROL 4 MCG/2ML IV SOLN
INTRAVENOUS | Status: AC
  Filled 2018-11-11: qty 2

## 2018-11-11 MED ORDER — DOXERCALCIFEROL 4 MCG/2ML IV SOLN
2.0000 ug | Freq: Once | INTRAVENOUS | Status: DC
  Filled 2018-11-11: qty 2

## 2018-11-11 MED ORDER — PANTOPRAZOLE SODIUM 40 MG IV SOLR: 40.0000 mg | Freq: Two times a day (BID) | INTRAVENOUS | Status: DC

## 2018-11-11 MED ORDER — DARBEPOETIN ALFA 100 MCG/0.5ML IJ SOSY: 100.0000 ug | PREFILLED_SYRINGE | INTRAMUSCULAR | Status: DC

## 2018-11-11 MED ORDER — SODIUM CHLORIDE 0.9 % IV SOLN
8.0000 mg/h | INTRAVENOUS | Status: DC
  Administered 2018-11-11 – 2018-11-12 (×3): 8 mg/h via INTRAVENOUS
  Filled 2018-11-11 (×6): qty 80

## 2018-11-11 MED ORDER — CHLORHEXIDINE GLUCONATE CLOTH 2 % EX PADS: 6.0000 | MEDICATED_PAD | Freq: Every day | CUTANEOUS | Status: DC

## 2018-11-11 MED ORDER — PEG-KCL-NACL-NASULF-NA ASC-C 100 G PO SOLR
0.5000 | Freq: Once | ORAL | Status: AC
  Administered 2018-11-11: 100 g via ORAL
  Filled 2018-11-11: qty 1

## 2018-11-11 NOTE — Consult Note (Signed)
[facility] Renal Consultation Note    Indication for Consultation:  Management of ESRD/hemodialysis; anemia, hypertension/volume and secondary hyperparathyroidism PCP:  HPI: Andrew Guerra is a 78 y.o. 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 HTN, hx RBBB, PAF, dHF, prior UGIB, renal tumor, prior bleeding AVMs who recently started HD in late September 2020, with interval admission 10/21 for transfusion for hgb of 5.9.  Follow up appt with GI showed persistent low hgb and he was advised to come to ED for evaluation where Hgb was showed to be 6.9 and platelets 210K. HE was transfused another 1 unit PRBC and admitted for further work of of GIB.  Hgb is now 8.1 He has been seen by GI who believes ABLA is due to recurrent small bowel AVMs. His stools are normally black due to the Fe based binder that he takes but this has intensified lately in relation to the GIB.  He has chronic LE edema which has improved some since on dialysis.  He wears chronic O2 and inspite of low hgb, DOE or SOB is still at baseline.  We are consulted to provide dialysis as he missed his treatment yesterday.  Past Medical History:  Diagnosis Date  . 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  .Marland KitchenCataract    right eye - surgery to remove  . Elevated PSA 11/29/2016   Patient is followed [facility].  Most recent PSA near 11.  He has had atypia on biopsy.  November 2008  . Essential hypertension, benign 06/08/2012  . Full dentures   . GI bleed   . Glaucoma   . Heart murmur    since childhood, never has caused any problems  . History of rheumatic fever 11/04/2015  . Hyperlipidemia 04/25/2013  . Hypertension   . Mal[place]08[place]  . RBBB 07/12/2017  . Sickle cell trait ([place]     no problems per patient   Past Surgical History:  Procedure Laterality Date  . BIOPSY  11/11/2017   Procedure: BIOPSY;  Surgeon: CLavena Bullion DO;  [facility];  Service: Gastroenterology;;  . BIOPSY  01/10/2018   Procedure: BIOPSY;  Surgeon: BThornton Park MD;  [place]  Service: Gastroenterology;;  . BIOPSY  06/22/2018   Procedure: BIOPSY;  Surgeon: AYetta Flock MD;  [facility];  Service: Gastroenterology;;  . cataract eye surgery Right   . COLONOSCOPY  11/2009   hx polyps/[place]  . COLONOSCOPY N/A 06/22/2018   Procedure: COLONOSCOPY;  Surgeon: AYetta Flock MD;  [place]  Service: Gastroenterology;  Laterality: N/A;  . ENTEROSCOPY N/A 06/22/2018   Procedure: PUSH ENTEROSCOPY;  Surgeon: AYetta Flock MD;  [facility];  Service: Gastroenterology;  Laterality: N/A;  . ESOPHAGOGASTRODUODENOSCOPY N/A 11/11/2017   Procedure: ESOPHAGOGASTRODUODENOSCOPY (EGD);  Surgeon: CLavena Bullion DO;  [facility]  Service: Gastroenterology;  Laterality: N/A;  . ESOPHAGOGASTRODUODENOSCOPY (EGD) WITH PROPOFOL N/A 01/10/2018   Procedure: ESOPHAGOGASTRODUODENOSCOPY (EGD) WITH PROPOFOL;  Surgeon: BThornton Park MD;  [place]  Service: Gastroenterology;  Laterality: N/A;  . HOT HEMOSTASIS N/A 06/22/2018   Procedure: HOT HEMOSTASIS (ARGON PLASMA COAGULATION/BICAP);  Surgeon: AYetta Flock MD;  [facility]  Service: Gastroenterology;  Laterality: N/A;  . IR FLUORO GUIDE CV LINE RIGHT  01/16/2018  . IR FLUORO GUIDE CV LINE RIGHT  01/31/2018  . IR FLUORO GUIDE CV LINE RIGHT  10/06/2018  . IR FLUORO GUIDE CV LINE RIGHT  10/11/2018  . IR US GUIDE VASC ACCESS RIGHT  01/16/2018  . IR US GUIDE VASC ACCESS RIGHT  01/31/2018  . IR US GUIDE VASC ACCESS RIGHT  10/06/2018  . POLYPECTOMY  06/22/2018   Procedure: POLYPECTOMY;  Surgeon: Yetta Flock, MD;  Location: [place];  Service: Gastroenterology;;  . PROSTATE BIOPSY    . right eye surgery     to lower eye pressure  . RIGHT HEART CATH N/A 01/19/2018   Procedure: RIGHT HEART CATH;  Surgeon: Nelva Bush, MD;  Location: [place] CV [facility];  Service: Cardiovascular;  Laterality: N/A;  . TONSILLECTOMY    . VIDEO ASSISTED THORACOSCOPY (VATS)/ LOBECTOMY Right 12/26/2015  . wisdom tteeth ext     Family History  Problem Relation Age of Onset  . Diabetes Mother   . Cerebral aneurysm Mother   . Lung cancer Father   . Diabetes Sister   . Colon cancer Neg Hx   . Colon polyps Neg Hx   . Esophageal cancer Neg Hx   . Rectal cancer Neg Hx   . Stomach cancer Neg Hx    Social History:  reports that he quit smoking about 7 years ago. His smoking use included cigarettes. He smoked 1.00 pack per day. He has never used smokeless tobacco. He reports current alcohol use of about 1.0 standard drinks of alcohol per week. He reports that he does not use drugs. Allergies  Allergen Reactions  . Ambien [Zolpidem Tartrate] Other (See Comments)    Has hallucinations and Homicidal Ideations per Wife   . Lipitor [Atorvastatin] Other (See Comments)    Achy and tired   Prior to Admission medications   Medication Sig Start Date End Date Taking? Authorizing Provider  amLODipine (NORVASC) 5 MG tablet Take 1 tablet (5 mg total) by mouth daily. 10/13/18  Yes Shelly Coss, MD  aspirin EC 81 MG EC tablet Take 1 tablet (81 mg total) by mouth daily. 10/13/18  Yes Shelly Coss, MD  Brinzolamide-Brimonidine [facility]) 1-0.2 % SUSP Place 2-3 drops into both eyes 3 (three) times daily.    Yes [provider]  carvedilol (COREG) 3.125 MG tablet Take 1 tablet (3.125 mg total) by mouth 2 (two) times daily with a meal. 10/13/18  Yes Adhikari, Amrit, MD  hydrALAZINE (APRESOLINE) 25 MG tablet Take 1 tablet (25 mg total) by mouth every 8 (eight) hours. 10/13/18  Yes Shelly Coss, MD  Lifitegrast [facility]ar) 5 % SOLN Place 1  drop into both eyes 2 (two) times daily as needed (dry eyes).   Yes [provider]  multivitamin (RENA-VIT) TABS tablet Take 1 tablet by mouth daily.   Yes [provider]  Netarsudil-Latanoprost (ROCKLATAN) 0.02-0.005 % SOLN Place 1 drop into both eyes at bedtime.    Yes Marylynn Pearson, MD  Nutritional Supplements (FEEDING SUPPLEMENT, NEPRO CARB STEADY,) LIQD Take 237 mLs by mouth 2 (two) times daily.   Yes [provider]  pantoprazole (PROTONIX) 40 MG tablet TAKE 1 TABLET BY MOUTH TWICE A DAY Patient taking differently: Take 40 mg by mouth 2 (two) times daily.  09/24/18  Yes [name], MD  predniSONE (DELTASONE) 20 MG tablet Take 3 tablets (60 mg total) by mouth daily with breakfast. 10/13/18 11/12/18 Yes Adhikari, Tamsen Meek, MD  ferric citrate (AURYXIA) 1 GM 210 MG(Fe) tablet Take 420  mg by mouth 3 (three) times daily with meals.    [provider]   Current Facility-Administered Medications  Medication Dose Route Frequency Provider Last Rate Last Dose  . 0.9 %  sodium chloride infusion  10 mL/hr Intravenous Once Rise Patience, MD   Stopped at 11/10/18 2233  . acetaminophen (TYLENOL) tablet 650 mg  650 mg Oral Q6H PRN Rise Patience, MD       Or  . acetaminophen (TYLENOL) suppository 650 mg  650 mg Rectal Q6H PRN Rise Patience, MD      . amLODipine (NORVASC) tablet 5 mg  5 mg Oral Daily Rise Patience, MD   5 mg at 11/11/18 9379  . carvedilol (COREG) tablet 3.125 mg  3.125 mg Oral BID WC Rise Patience, MD   3.125 mg at 11/11/18 0725  . feeding supplement (NEPRO CARB STEADY) liquid 237 mL  237 mL Oral BID Rise Patience, MD   Stopped at 11/11/18 0932  . hydrALAZINE (APRESOLINE) tablet 25 mg  25 mg Oral Q8H Rise Patience, MD   25 mg at 11/11/18 0538  . multivitamin (RENA-VIT) tablet 1 tablet  1 tablet Oral QHS Rise Patience, MD      . Netarsudil-Latanoprost 0.02-0.005 % SOLN 1 drop  1 drop Both Eyes QHS  Rise Patience, MD   Stopped at 11/10/18 2234  . ondansetron (ZOFRAN) tablet 4 mg  4 mg Oral Q6H PRN Rise Patience, MD       Or  . ondansetron [facility]) injection 4 mg  4 mg Intravenous Q6H PRN Rise Patience, MD      . pantoprazole (PROTONIX) 80 mg in sodium chloride 0.9 % 250 mL (0.32 mg/mL) infusion  8 mg/hr Intravenous Continuous Rise Patience, MD 25 mL/hr at 11/11/18 0551 8 mg/hr at 11/11/18 0551  . [START ON 11/14/2018] pantoprazole (PROTONIX) injection 40 mg  40 mg Intravenous Q12H Rise Patience, MD      . peg 3350 powder (MOVIPREP) kit 100 g  0.5 kit Oral Once [place] Erp, [place]      . predniSONE (DELTASONE) tablet 60 mg  60 mg Oral Q breakfast Rise Patience, MD   60 mg at 11/11/18 0240   Current Outpatient Medications  Medication Sig Dispense Refill  . amLODipine (NORVASC) 5 MG tablet Take 1 tablet (5 mg total) by mouth daily. 30 tablet 0  . aspirin EC 81 MG EC tablet Take 1 tablet (81 mg total) by mouth daily. 30 tablet 0  . Brinzolamide-Brimonidine (SIMBRINZA) 1-0.2 % SUSP Place 2-3 drops into both eyes 3 (three) times daily.     . carvedilol (COREG) 3.125 MG tablet Take 1 tablet (3.125 mg total) by mouth 2 (two) times daily with a meal. 60 tablet 0  . hydrALAZINE (APRESOLINE) 25 MG tablet Take 1 tablet (25 mg total) by mouth every 8 (eight) hours. 90 tablet 0  . Lifitegrast (XIIDRA) 5 % SOLN Place 1 drop into both eyes 2 (two) times daily as needed (dry eyes).    . multivitamin (RENA-VIT) TABS tablet Take 1 tablet by mouth daily.    . Netarsudil-Latanoprost (ROCKLATAN) 0.02-0.005 % SOLN Place 1 drop into both eyes at bedtime.     . Nutritional Supplements (FEEDING SUPPLEMENT, NEPRO CARB STEADY,) LIQD Take 237 mLs by mouth 2 (two) times daily.    . pantoprazole (PROTONIX) 40 MG tablet TAKE 1 TABLET BY MOUTH TWICE A DAY (Patient taking differently: Take 40 mg  by mouth 2 (two) times daily. ) 180 tablet 1  . predniSONE (DELTASONE) 20 MG  tablet Take 3 tablets (60 mg total) by mouth daily with breakfast. 90 tablet 0  . ferric citrate (AURYXIA) 1 GM 210 MG(Fe) tablet Take 420 mg by mouth 3 (three) times daily with meals.     Labs: Basic Metabolic Panel: Recent Labs  Lab 11/10/18 1211 11/11/18 0228 11/11/18 0541  NA 138 139 139  K 4.0 4.1 4.2  CL 96* 97* 98  CO2 '29 30 26  ' GLUCOSE 152* 80 75  BUN 59* 63* 64*  CREATININE 3.94* 3.87* 3.94*  CALCIUM 7.7* 7.7* 7.7*   Liver Function Tests: Recent Labs  Lab 11/10/18 1211  AST 19  ALT 19  ALKPHOS 69  BILITOT <0.1*  PROT 4.8*  ALBUMIN 2.4*   CBC: Recent Labs  Lab 11/09/18 1516 11/10/18 1211 11/11/18 0228 11/11/18 0541  WBC 9.4 11.5* 11.4* 11.1*  HGB 6.5 Repeated and verified X2.* 6.9* 8.1* 8.2*  HCT 20.1 Repeated and verified X2.* 23.0* 25.4* 26.2*  MCV 84.3 88.5 85.2 87.9  PLT 193.0 210 199 189   Cardiac Enzymes: No results for input(s): CKTOTAL, CKMB, CKMBINDEX, TROPONINI in the last 168 hours. CBG: Recent Labs  Lab 11/10/18 2223  GLUCAP 112*   Iron Studies: No results for input(s): IRON, TIBC, TRANSFERRIN, FERRITIN in the last 72 hours. Studies/Results: Dg Chest Port 1 View  Result Date: 11/10/2018 CLINICAL DATA:  Dialysis EXAM: PORTABLE CHEST 1 VIEW COMPARISON:  10/04/2018 FINDINGS: Interval placement of a large-bore right neck multi lumen vascular catheter. Unchanged postoperative findings of the right lung with bibasilar atelectasis, consolidation, or scarring and a right pleural effusion. Cardiomegaly. IMPRESSION: 1. Interval placement of a large-bore right neck multi lumen vascular catheter. 2. Unchanged postoperative findings of the right lung with bibasilar atelectasis, consolidation, or scarring and a right pleural effusion. Electronically Signed   By: Eddie Candle M.D.   On: 11/10/2018 19:51    ROS: As per HPI otherwise negative.  Physical Exam: Vitals:   11/11/18 0725 11/11/18 0800 11/11/18 0900 11/11/18 0921  BP: (!) 176/84 (!) 168/72  (!) 169/87 (!) 169/87  Pulse: 83     Resp:      Temp:      TempSrc:      SpO2:      Weight:      Height:         General: elderly male sitting on side of bed wearing O2 Head: NCAT sclera not icteric MMM Neck: Supple.  Lungs: CTA bilaterally without wheezes, rales, or rhonchi. Breathing is unlabored. Heart: RRR 3/6 murmur Abdomen: soft NT + BS Lower extremities: 2 + pitting edema right with some chronic skin changes Neuro: A & O  X 3. Moves all extremities spontaneously. Psych:  Responds to questions appropriately with a normal affect. Dialysis Access:right IJ TDC   Dialysis Orders: MWF NW 4.25 hours 400/800 EDW 88 2K 2 Ca TDC right IJ TDC - no heparin Mircera 150 q 2 weeks next dose - last had Mircera 75 10/17  - venofer 100x 8 started 10/12 Hectorol 2   Assessment/Plan: 1.  Recurrent GIB likely due to bleeding small bowel AVMs - GI following - no heparin HD 2.  ESRD -  MWF - HD today - 3 K bath - due to cardiac hx- back on schedule Monday 3.  Hypertension/volume  - BP high - tends high at outpatient unit - gets to edw -BP generally comes down during  HD on coreg 3.125 bid and hydral 25 tid and amlodipine 5  - needs more volume down - maybe can back off on meds to help with UF- already got 5 of amlodipine today. 4.  Anemia  - hgb 8.2 s/p one unit PRBC - continue ESA - dose Mircera recently increased from 75 > 150 - due for redose 10.30 - continue IV Fe 5.  Metabolic bone disease -  Continue VDRA;  resume Aurixya 1 ac when off CL 6.  Nutrition - CL for now pending GI work up  Myriam Jacobson, PA-C [employer] [phone] 11/11/2018, 9:57 AM

## 2018-11-11 NOTE — ED Notes (Signed)
Report called to dialysis, transporting up to [facility] 2

## 2018-11-11 NOTE — H&P (View-Only) (Signed)
Consultation  Referring Provider: Dr. Eliseo Squires     Primary Care Physician:  [name], MD Primary Gastroenterologist:  Dr. Tarri Glenn       Reason for Consultation: Recurrent blood loss anemia             HPI:   Andrew Guerra is a 78 y.o. male with a past medical history as listed below including moderate aortic stenosis, ESRD on dialysis, oxygen dependent, and multiple others listed below, who presented to the ER 11/10/2018 after finding by our outpatient clinic of a hemoglobin of 6.5.    11/09/2018 patient seen in her outpatient clinic by [name], [place].  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  He had 7 duodenal and proximal jejunal AVMs treated with APC on 06/22/2018, was doing well when seen by Dr. Tarri Glenn via virtual visit on 07/27/2018 but at that time had a drop in hemoglobin again to 5.92 weeks ago and was given 1 unit of PRBCs in the ER and told to follow-up with [name] in clinic.  At that time he had reports of melena which was Hemoccult positive.    Today, the patient tells me that he feels well, in fact he has never really felt bad with any of this going on.  He has continued with melena at home and is aware of plans for enteroscopy.  He just "wants to get it done so I go home".  Explains that he is somewhat frustrated with his diagnosis but understands that he may have to have more than one procedure and recurrent monitoring of hgb.    Denies fever, chills, weight loss or abdominal pain.  ED Course: In the ER repeat labs show hemoglobin of 6.9 platelets 210 creatinine 3.9 potassium 4 albumin 2.4.  Patient was given 1 unit of packed red blood cell transfusion started on Protonix. COVID-19 test was negative.  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   Cataract    right eye -  surgery to remove   Elevated PSA 11/29/2016   Patient is followed by [facility].  Most recent PSA near 11.  He has had atypia on biopsy.  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[place]) 02/08/2017   RBBB 07/12/2017   Sickle cell trait ([place])    no problems per patient    Past Surgical History:  Procedure Laterality Date   BIOPSY  11/11/2017   Procedure: BIOPSY;  Surgeon: Lavena Bullion, DO;  Location: [facility] ENDOSCOPY;  Service: Gastroenterology;;   BIOPSY  01/10/2018   Procedure: BIOPSY;  Surgeon: [name], MD;  Location: [place];  Service: Gastroenterology;;   BIOPSY  06/22/2018   Procedure: BIOPSY;  Surgeon: Yetta Flock, MD;  [facility] ENDOSCOPY;  Service: Gastroenterology;;   cataract eye surgery Right    COLONOSCOPY  11/2009   hx polyps/[place]   COLONOSCOPY N/A 06/22/2018   Procedure: COLONOSCOPY;  Surgeon: Yetta Flock, MD;  Location: [place];  Service: Gastroenterology;  Laterality:  N/A;   ENTEROSCOPY N/A 06/22/2018   Procedure: PUSH ENTEROSCOPY;  Surgeon: Yetta Flock, MD;  Location[facility] ENDOSCOPY;  Service: Gastroenterology;  Laterality: N/A;   ESOPHAGOGASTRODUODENOSCOPY N/A 11/11/2017   Procedure: ESOPHAGOGASTRODUODENOSCOPY (EGD);  Surgeon: Lavena Bullion, DO;  Location: [facility] ENDOSCOPY;  Service: Gastroenterology;  Laterality: N/A;   ESOPHAGOGASTRODUODENOSCOPY (EGD) WITH PROPOFOL N/A 01/10/2018   Procedure: ESOPHAGOGASTRODUODENOSCOPY (EGD) WITH PROPOFOL;  Surgeon: [name], MD;  Location: [place];  Service: Gastroenterology;  Laterality: N/A;   HOT HEMOSTASIS N/A 06/22/2018   Procedure: HOT HEMOSTASIS (ARGON PLASMA COAGULATION/BICAP);  Surgeon: Yetta Flock, MD;  Location: [facility] ENDOSCOPY;  Service: Gastroenterology;  Laterality: N/A;   IR FLUORO GUIDE CV LINE RIGHT  01/16/2018   IR FLUORO GUIDE CV LINE RIGHT  01/31/2018   IR FLUORO GUIDE CV LINE RIGHT  10/06/2018   IR FLUORO GUIDE CV LINE RIGHT  10/11/2018   IR US GUIDE VASC ACCESS RIGHT  01/16/2018   IR US GUIDE VASC ACCESS RIGHT  01/31/2018   IR US GUIDE VASC ACCESS RIGHT  10/06/2018   POLYPECTOMY  06/22/2018   Procedure: POLYPECTOMY;  Surgeon: Yetta Flock, MD;  Location: [place];  Service: Gastroenterology;;   PROSTATE BIOPSY     right eye surgery     to lower eye pressure   RIGHT HEART CATH N/A 01/19/2018   Procedure: RIGHT HEART CATH;  Surgeon: Nelva Bush, MD;  Location: [place] CV LAB;  Service: Cardiovascular;  Laterality: N/A;   TONSILLECTOMY     VIDEO ASSISTED THORACOSCOPY (VATS)/ LOBECTOMY Right 12/26/2015   wisdom tteeth ext      Family History  Problem Relation Age of Onset   Diabetes Mother    Cerebral aneurysm Mother    Lung cancer Father    Diabetes Sister    Colon cancer Neg Hx    Colon polyps Neg Hx    Esophageal cancer Neg Hx    Rectal cancer Neg Hx    Stomach cancer Neg Hx     Social History   Tobacco Use   Smoking status: Former Smoker    Packs/day: 1.00    Types: Cigarettes    Quit date: 01/19/2011    Years since quitting: 7.8   Smokeless tobacco: Never Used  Substance Use Topics   Alcohol use: Yes    Alcohol/week: 1.0 standard drinks    Types: 1 Shots of liquor per week    Comment: rarely   Drug use: No    Prior to Admission medications   Medication Sig Start Date End Date Taking? Authorizing Provider  amLODipine (NORVASC) 5 MG tablet Take 1 tablet (5 mg total) by mouth daily. 10/13/18  Yes Shelly Coss, MD  aspirin EC 81 MG EC tablet Take 1 tablet (81 mg total) by mouth daily. 10/13/18  Yes Shelly Coss, MD  Brinzolamide-Brimonidine [facility]) 1-0.2 % SUSP Place 2-3 drops into both eyes 3  (three) times daily.    Yes [provider]  carvedilol (COREG) 3.125 MG tablet Take 1 tablet (3.125 mg total) by mouth 2 (two) times daily with a meal. 10/13/18  Yes Adhikari, Amrit, MD  hydrALAZINE (APRESOLINE) 25 MG tablet Take 1 tablet (25 mg total) by mouth every 8 (eight) hours. 10/13/18  Yes Shelly Coss, MD  Lifitegrast [facility]) 5 % SOLN Place 1 drop into both eyes 2 (two) times daily as needed (dry eyes).   Yes [provider]  multivitamin (RENA-VIT) TABS tablet Take 1 tablet by mouth daily.   Yes  [provider]  Netarsudil-Latanoprost (ROCKLATAN) 0.02-0.005 % SOLN Place 1 drop into both eyes at bedtime.    Yes Marylynn Pearson, MD  Nutritional Supplements (FEEDING SUPPLEMENT, NEPRO CARB STEADY,) LIQD Take 237 mLs by mouth 2 (two) times daily.   Yes [provider]  pantoprazole (PROTONIX) 40 MG tablet TAKE 1 TABLET BY MOUTH TWICE A DAY Patient taking differently: Take 40 mg by mouth 2 (two) times daily.  09/24/18  Yes [name], MD  predniSONE (DELTASONE) 20 MG tablet Take 3 tablets (60 mg total) by mouth daily with breakfast. 10/13/18 11/12/18 Yes Shelly Coss, MD  ferric citrate (AURYXIA) 1 GM 210 MG(Fe) tablet Take 420 mg by mouth 3 (three) times daily with meals.    [provider]    Current Facility-Administered Medications  Medication Dose Route Frequency Provider Last Rate Last Dose   0.9 %  sodium chloride infusion  10 mL/hr Intravenous Once Rise Patience, MD   Stopped at 11/10/18 2233   acetaminophen (TYLENOL) tablet 650 mg  650 mg Oral Q6H PRN Rise Patience, MD       Or   acetaminophen (TYLENOL) suppository 650 mg  650 mg Rectal Q6H PRN Rise Patience, MD       amLODipine (NORVASC) tablet 5 mg  5 mg Oral Daily Rise Patience, MD       carvedilol (COREG) tablet 3.125 mg  3.125 mg Oral BID WC Rise Patience, MD   3.125 mg at 11/11/18 0725   feeding supplement (NEPRO CARB STEADY) liquid  237 mL  237 mL Oral BID Rise Patience, MD       hydrALAZINE (APRESOLINE) tablet 25 mg  25 mg Oral Q8H Rise Patience, MD   25 mg at 11/11/18 2706   multivitamin (RENA-VIT) tablet 1 tablet  1 tablet Oral QHS Rise Patience, MD       Netarsudil-Latanoprost 0.02-0.005 % SOLN 1 drop  1 drop Both Eyes QHS Rise Patience, MD   Stopped at 11/10/18 2234   ondansetron (ZOFRAN) tablet 4 mg  4 mg Oral Q6H PRN Rise Patience, MD       Or   ondansetron [facility]) injection 4 mg  4 mg Intravenous Q6H PRN Rise Patience, MD       pantoprazole (PROTONIX) 80 mg in sodium chloride 0.9 % 250 mL (0.32 mg/mL) infusion  8 mg/hr Intravenous Continuous Rise Patience, MD 25 mL/hr at 11/11/18 0551 8 mg/hr at 11/11/18 0551   [START ON 11/14/2018] pantoprazole (PROTONIX) injection 40 mg  40 mg Intravenous Q12H Rise Patience, MD       predniSONE (DELTASONE) tablet 60 mg  60 mg Oral Q breakfast Rise Patience, MD   60 mg at 11/11/18 2376   Current Outpatient Medications  Medication Sig Dispense Refill   amLODipine (NORVASC) 5 MG tablet Take 1 tablet (5 mg total) by mouth daily. 30 tablet 0   aspirin EC 81 MG EC tablet Take 1 tablet (81 mg total) by mouth daily. 30 tablet 0   Brinzolamide-Brimonidine (SIMBRINZA) 1-0.2 % SUSP Place 2-3 drops into both eyes 3 (three) times daily.      carvedilol (COREG) 3.125 MG tablet Take 1 tablet (3.125 mg total) by mouth 2 (two) times daily with a meal. 60 tablet 0   hydrALAZINE (APRESOLINE) 25 MG tablet Take 1 tablet (25 mg total) by mouth every 8 (eight) hours. 90 tablet 0   Lifitegrast (XIIDRA) 5 % SOLN Place  1 drop into both eyes 2 (two) times daily as needed (dry eyes).     multivitamin (RENA-VIT) TABS tablet Take 1 tablet by mouth daily.     Netarsudil-Latanoprost (ROCKLATAN) 0.02-0.005 % SOLN Place 1 drop into both eyes at bedtime.      Nutritional Supplements (FEEDING SUPPLEMENT, NEPRO CARB STEADY,) LIQD Take  237 mLs by mouth 2 (two) times daily.     pantoprazole (PROTONIX) 40 MG tablet TAKE 1 TABLET BY MOUTH TWICE A DAY (Patient taking differently: Take 40 mg by mouth 2 (two) times daily. ) 180 tablet 1   predniSONE (DELTASONE) 20 MG tablet Take 3 tablets (60 mg total) by mouth daily with breakfast. 90 tablet 0   ferric citrate (AURYXIA) 1 GM 210 MG(Fe) tablet Take 420 mg by mouth 3 (three) times daily with meals.      Allergies as of 11/10/2018 - Review Complete 11/10/2018  Allergen Reaction Noted   Ambien [zolpidem tartrate] Other (See Comments) 01/31/2018   Lipitor [atorvastatin] Other (See Comments) 07/31/2017     Review of Systems:    Constitutional: No weight loss, fever or chills Skin: No rash Cardiovascular: No chest pain Respiratory: No SOB  Gastrointestinal: See HPI and otherwise negative Genitourinary: No dysuria  Neurological: No headache, dizziness or syncope Musculoskeletal: No new muscle or joint pain Hematologic: No bruising Psychiatric: No history of depression or anxiety    Physical Exam:  Vital signs in last 24 hours: Temp:  [97.4 F (36.3 C)-99 F (37.2 C)] 97.9 F (36.6 C) (10/24 0536) Pulse Rate:  [53-85] 83 (10/24 0725) Resp:  [13-22] 21 (10/24 0724) BP: (150-218)/(66-98) 176/84 (10/24 0725) SpO2:  [92 %-100 %] 100 % (10/24 0724) Weight:  [87.6 kg] 87.6 kg (10/24 0536)   General:   Pleasant AA male appears to be in NAD, Well developed, Well nourished, alert and cooperative Head:  Normocephalic and atraumatic. Eyes:   PEERL, EOMI. No icterus. Conjunctiva pink. Ears:  Normal auditory acuity. Neck:  Supple Throat: Oral cavity and pharynx without inflammation, swelling or lesion.  Lungs: Respirations even and unlabored. Lungs clear to auscultation bilaterally.   No wheezes, crackles, or rhonchi.  Heart: Normal S1, S2. No MRG. Regular rate and rhythm. No peripheral edema, cyanosis or pallor.  Abdomen:  Soft, nondistended, nontender. No rebound or  guarding. Normal bowel sounds. No appreciable masses or hepatomegaly. Rectal:  Not performed.  Msk:  Symmetrical without gross deformities. Peripheral pulses intact.  Extremities:  Without edema, no deformity or joint abnormality. Normal ROM, normal sensation. Neurologic:  Alert and  oriented x4;  grossly normal neurologically.   Skin:   Dry and intact without significant lesions or rashes. Psychiatric: Demonstrates good judgement and reason without abnormal affect or behaviors.   LAB RESULTS: Recent Labs    11/10/18 1211 11/11/18 0228 11/11/18 0541  WBC 11.5* 11.4* 11.1*  HGB 6.9* 8.1* 8.2*  HCT 23.0* 25.4* 26.2*  PLT 210 199 189   BMET Recent Labs    11/10/18 1211 11/11/18 0228 11/11/18 0541  NA 138 139 139  K 4.0 4.1 4.2  CL 96* 97* 98  CO2 29 30 26   GLUCOSE 152* 80 75  BUN 59* 63* 64*  CREATININE 3.94* 3.87* 3.94*  CALCIUM 7.7* 7.7* 7.7*   LFT Recent Labs    11/10/18 1211  PROT 4.8*  ALBUMIN 2.4*  AST 19  ALT 19  ALKPHOS 69  BILITOT <0.1*   STUDIES: Dg Chest Port 1 View  Result Date: 11/10/2018 CLINICAL DATA:  Dialysis EXAM: PORTABLE CHEST 1 VIEW COMPARISON:  10/04/2018 FINDINGS: Interval placement of a large-bore right neck multi lumen vascular catheter. Unchanged postoperative findings of the right lung with bibasilar atelectasis, consolidation, or scarring and a right pleural effusion. Cardiomegaly. IMPRESSION: 1. Interval placement of a large-bore right neck multi lumen vascular catheter. 2. Unchanged postoperative findings of the right lung with bibasilar atelectasis, consolidation, or scarring and a right pleural effusion. Electronically Signed   By: Eddie Candle M.D.   On: 11/10/2018 19:51    Impression / Plan:   Impression: 1.  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  ESRD on dialysis 3.  O2 dependent 4.  Moderate aortic stenosis due to bicuspid aortic valve  Plan: 1.  We will plan for enteroscopy tomorrow with Dr. Ardis Hughs.  Did discuss risks, benefits, limitations and alternatives and the patient agrees to proceed.  Patient may have capsule endoscopy placed at the same time pending findings. 2.  Ordered half of a movi prep to be given this evening 3.  Patient can be on a clear liquid diet today and n.p.o. after midnight 4.  Appreciate nephrology's assistance with dialysis 5.  Please await any further recommendations from Dr. Ardis Hughs later today.  Thank you for your kind consultation, we will continue to follow.  Lavone Nian Lemmon  11/11/2018, 8:59 AM  ________________________________________________________________________  Velora Heckler GI MD note:  I personally examined the patient, reviewed the data and agree with the assessment and plan described above.  I am planning repeat enteroscopy tomorrow and will probably place SB enteroscopy pill camera as well to evaluate the more distal small bowel.   Owens Loffler, MD [facility] Gastroenterology Pager [phone]

## 2018-11-11 NOTE — Consult Note (Addendum)
Consultation  Referring Provider: Dr. Eliseo Squires     Primary Care Physician:  [name], MD Primary Gastroenterologist:  Dr. Tarri Glenn       Reason for Consultation: Recurrent blood loss anemia             HPI:   Andrew Guerra is a 78 y.o. male with a past medical history as listed below including moderate aortic stenosis, ESRD on dialysis, oxygen dependent, and multiple others listed below, who presented to the ER 11/10/2018 after finding by our outpatient clinic of a hemoglobin of 6.5.    11/09/2018 patient seen in her outpatient clinic by [name], [place].  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  He had 7 duodenal and proximal jejunal AVMs treated with APC on 06/22/2018, was doing well when seen by Dr. Tarri Glenn via virtual visit on 07/27/2018 but at that time had a drop in hemoglobin again to 5.92 weeks ago and was given 1 unit of PRBCs in the ER and told to follow-up with [name] in clinic.  At that time he had reports of melena which was Hemoccult positive.    Today, the patient tells me that he feels well, in fact he has never really felt bad with any of this going on.  He has continued with melena at home and is aware of plans for enteroscopy.  He just "wants to get it done so I go home".  Explains that he is somewhat frustrated with his diagnosis but understands that he may have to have more than one procedure and recurrent monitoring of hgb.    Denies fever, chills, weight loss or abdominal pain.  ED Course: In the ER repeat labs show hemoglobin of 6.9 platelets 210 creatinine 3.9 potassium 4 albumin 2.4.  Patient was given 1 unit of packed red blood cell transfusion started on Protonix. COVID-19 test was negative.  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   Cataract    right eye -  surgery to remove   Elevated PSA 11/29/2016   Patient is [facility].  Most recent PSA near 11.  He has had atypia on biopsy.  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[place]) 02/08/2017   RBBB 07/12/2017   Sickle cell trait ([place])    no problems per patient    Past Surgical History:  Procedure Laterality Date   BIOPSY  11/11/2017   Procedure: BIOPSY;  Surgeon: Lavena Bullion, DO;  Location: [place] ENDOSCOPY;  Service: Gastroenterology;;   BIOPSY  01/10/2018   Procedure: BIOPSY;  Surgeon: [name], MD;  Location: [place];  Service: Gastroenterology;;   BIOPSY  06/22/2018   Procedure: BIOPSY;  Surgeon: Yetta Flock, MD;  Location: [facility] ENDOSCOPY;  Service: Gastroenterology;;   cataract eye surgery Right    COLONOSCOPY  11/2009   hx polyps/[place]   COLONOSCOPY N/A 06/22/2018   Procedure: COLONOSCOPY;  Surgeon: Yetta Flock, MD;  Location: [place];  Service: Gastroenterology;  Laterality:  N/A;   ENTEROSCOPY N/A 06/22/2018   Procedure: PUSH ENTEROSCOPY;  Surgeon: Yetta Flock, MD;  Location: [facility] ENDOSCOPY;  Service: Gastroenterology;  Laterality: N/A;   ESOPHAGOGASTRODUODENOSCOPY N/A 11/11/2017   Procedure: ESOPHAGOGASTRODUODENOSCOPY (EGD);  Surgeon: Lavena Bullion, DO;  Location[facility]COPY;  Service: Gastroenterology;  Laterality: N/A;   ESOPHAGOGASTRODUODENOSCOPY (EGD) WITH PROPOFOL N/A 01/10/2018   Procedure: ESOPHAGOGASTRODUODENOSCOPY (EGD) WITH PROPOFOL;  Surgeon: [name], MD;  Location: [place];  Service: Gastroenterology;  Laterality: N/A;   HOT HEMOSTASIS N/A 06/22/2018   Procedure: HOT HEMOSTASIS (ARGON PLASMA COAGULATION/BICAP);  Surgeon: Yetta Flock, MD;  Location: [facility] ENDOSCOPY;  Service: Gastroenterology;  Laterality: N/A;   IR FLUORO GUIDE CV LINE RIGHT  01/16/2018   IR FLUORO GUIDE CV LINE RIGHT  01/31/2018   IR FLUORO GUIDE CV LINE RIGHT  10/06/2018   IR FLUORO GUIDE CV LINE RIGHT  10/11/2018   IR US GUIDE VASC ACCESS RIGHT  01/16/2018   IR US GUIDE VASC ACCESS RIGHT  01/31/2018   IR US GUIDE VASC ACCESS RIGHT  10/06/2018   POLYPECTOMY  06/22/2018   Procedure: POLYPECTOMY;  Surgeon: Yetta Flock, MD;  Location: [place];  Service: Gastroenterology;;   PROSTATE BIOPSY     right eye surgery     to lower eye pressure   RIGHT HEART CATH N/A 01/19/2018   Procedure: RIGHT HEART CATH;  Surgeon: Nelva Bush, MD;  Location: [place] CV LAB;  Service: Cardiovascular;  Laterality: N/A;   TONSILLECTOMY     VIDEO ASSISTED THORACOSCOPY (VATS)/ LOBECTOMY Right 12/26/2015   wisdom tteeth ext      Family History  Problem Relation Age of Onset   Diabetes Mother    Cerebral aneurysm Mother    Lung cancer Father    Diabetes Sister    Colon cancer Neg Hx    Colon polyps Neg Hx    Esophageal cancer Neg Hx    Rectal cancer Neg Hx    Stomach cancer Neg Hx     Social History   Tobacco Use   Smoking status: Former Smoker    Packs/day: 1.00    Types: Cigarettes    Quit date: 01/19/2011    Years since quitting: 7.8   Smokeless tobacco: Never Used  Substance Use Topics   Alcohol use: Yes    Alcohol/week: 1.0 standard drinks    Types: 1 Shots of liquor per week    Comment: rarely   Drug use: No    Prior to Admission medications   Medication Sig Start Date End Date Taking? Authorizing Provider  amLODipine (NORVASC) 5 MG tablet Take 1 tablet (5 mg total) by mouth daily. 10/13/18  Yes Shelly Coss, MD  aspirin EC 81 MG EC tablet Take 1 tablet (81 mg total) by mouth daily. 10/13/18  Yes Shelly Coss, MD  Brinzolamide-Brimonidine [facility]) 1-0.2 % SUSP Place 2-3 drops into both eyes 3  (three) times daily.    Yes [provider]  carvedilol (COREG) 3.125 MG tablet Take 1 tablet (3.125 mg total) by mouth 2 (two) times daily with a meal. 10/13/18  Yes Adhikari, Amrit, MD  hydrALAZINE (APRESOLINE) 25 MG tablet Take 1 tablet (25 mg total) by mouth every 8 (eight) hours. 10/13/18  Yes Shelly Coss, MD  Lifitegrast [facility]ar) 5 % SOLN Place 1 drop into both eyes 2 (two) times daily as needed (dry eyes).   Yes [provider]  multivitamin (RENA-VIT) TABS tablet Take 1 tablet by mouth daily.   Yes  [provider]  Netarsudil-Latanoprost (ROCKLATAN) 0.02-0.005 % SOLN Place 1 drop into both eyes at bedtime.    Yes Marylynn Pearson, MD  Nutritional Supplements (FEEDING SUPPLEMENT, NEPRO CARB STEADY,) LIQD Take 237 mLs by mouth 2 (two) times daily.   Yes [provider]  pantoprazole (PROTONIX) 40 MG tablet TAKE 1 TABLET BY MOUTH TWICE A DAY Patient taking differently: Take 40 mg by mouth 2 (two) times daily.  09/24/18  Yes [name], MD  predniSONE (DELTASONE) 20 MG tablet Take 3 tablets (60 mg total) by mouth daily with breakfast. 10/13/18 11/12/18 Yes Shelly Coss, MD  ferric citrate (AURYXIA) 1 GM 210 MG(Fe) tablet Take 420 mg by mouth 3 (three) times daily with meals.    [provider]    Current Facility-Administered Medications  Medication Dose Route Frequency Provider Last Rate Last Dose   0.9 %  sodium chloride infusion  10 mL/hr Intravenous Once Rise Patience, MD   Stopped at 11/10/18 2233   acetaminophen (TYLENOL) tablet 650 mg  650 mg Oral Q6H PRN Rise Patience, MD       Or   acetaminophen (TYLENOL) suppository 650 mg  650 mg Rectal Q6H PRN Rise Patience, MD       amLODipine (NORVASC) tablet 5 mg  5 mg Oral Daily Rise Patience, MD       carvedilol (COREG) tablet 3.125 mg  3.125 mg Oral BID WC Rise Patience, MD   3.125 mg at 11/11/18 0725   feeding supplement (NEPRO CARB STEADY) liquid  237 mL  237 mL Oral BID Rise Patience, MD       hydrALAZINE (APRESOLINE) tablet 25 mg  25 mg Oral Q8H Rise Patience, MD   25 mg at 11/11/18 6269   multivitamin (RENA-VIT) tablet 1 tablet  1 tablet Oral QHS Rise Patience, MD       Netarsudil-Latanoprost 0.02-0.005 % SOLN 1 drop  1 drop Both Eyes QHS Rise Patience, MD   Stopped at 11/10/18 2234   ondansetron (ZOFRAN) tablet 4 mg  4 mg Oral Q6H PRN Rise Patience, MD       Or   ondansetron [facility]) injection 4 mg  4 mg Intravenous Q6H PRN Rise Patience, MD       pantoprazole (PROTONIX) 80 mg in sodium chloride 0.9 % 250 mL (0.32 mg/mL) infusion  8 mg/hr Intravenous Continuous Rise Patience, MD 25 mL/hr at 11/11/18 0551 8 mg/hr at 11/11/18 0551   [START ON 11/14/2018] pantoprazole (PROTONIX) injection 40 mg  40 mg Intravenous Q12H Rise Patience, MD       predniSONE (DELTASONE) tablet 60 mg  60 mg Oral Q breakfast Rise Patience, MD   60 mg at 11/11/18 4854   Current Outpatient Medications  Medication Sig Dispense Refill   amLODipine (NORVASC) 5 MG tablet Take 1 tablet (5 mg total) by mouth daily. 30 tablet 0   aspirin EC 81 MG EC tablet Take 1 tablet (81 mg total) by mouth daily. 30 tablet 0   Brinzolamide-Brimonidine (SIMBRINZA) 1-0.2 % SUSP Place 2-3 drops into both eyes 3 (three) times daily.      carvedilol (COREG) 3.125 MG tablet Take 1 tablet (3.125 mg total) by mouth 2 (two) times daily with a meal. 60 tablet 0   hydrALAZINE (APRESOLINE) 25 MG tablet Take 1 tablet (25 mg total) by mouth every 8 (eight) hours. 90 tablet 0   Lifitegrast (XIIDRA) 5 % SOLN Place  1 drop into both eyes 2 (two) times daily as needed (dry eyes).     multivitamin (RENA-VIT) TABS tablet Take 1 tablet by mouth daily.     Netarsudil-Latanoprost (ROCKLATAN) 0.02-0.005 % SOLN Place 1 drop into both eyes at bedtime.      Nutritional Supplements (FEEDING SUPPLEMENT, NEPRO CARB STEADY,) LIQD Take  237 mLs by mouth 2 (two) times daily.     pantoprazole (PROTONIX) 40 MG tablet TAKE 1 TABLET BY MOUTH TWICE A DAY (Patient taking differently: Take 40 mg by mouth 2 (two) times daily. ) 180 tablet 1   predniSONE (DELTASONE) 20 MG tablet Take 3 tablets (60 mg total) by mouth daily with breakfast. 90 tablet 0   ferric citrate (AURYXIA) 1 GM 210 MG(Fe) tablet Take 420 mg by mouth 3 (three) times daily with meals.      Allergies as of 11/10/2018 - Review Complete 11/10/2018  Allergen Reaction Noted   Ambien [zolpidem tartrate] Other (See Comments) 01/31/2018   Lipitor [atorvastatin] Other (See Comments) 07/31/2017     Review of Systems:    Constitutional: No weight loss, fever or chills Skin: No rash Cardiovascular: No chest pain Respiratory: No SOB  Gastrointestinal: See HPI and otherwise negative Genitourinary: No dysuria  Neurological: No headache, dizziness or syncope Musculoskeletal: No new muscle or joint pain Hematologic: No bruising Psychiatric: No history of depression or anxiety    Physical Exam:  Vital signs in last 24 hours: Temp:  [97.4 F (36.3 C)-99 F (37.2 C)] 97.9 F (36.6 C) (10/24 0536) Pulse Rate:  [53-85] 83 (10/24 0725) Resp:  [13-22] 21 (10/24 0724) BP: (150-218)/(66-98) 176/84 (10/24 0725) SpO2:  [92 %-100 %] 100 % (10/24 0724) Weight:  [87.6 kg] 87.6 kg (10/24 0536)   General:   Pleasant AA male appears to be in NAD, Well developed, Well nourished, alert and cooperative Head:  Normocephalic and atraumatic. Eyes:   PEERL, EOMI. No icterus. Conjunctiva pink. Ears:  Normal auditory acuity. Neck:  Supple Throat: Oral cavity and pharynx without inflammation, swelling or lesion.  Lungs: Respirations even and unlabored. Lungs clear to auscultation bilaterally.   No wheezes, crackles, or rhonchi.  Heart: Normal S1, S2. No MRG. Regular rate and rhythm. No peripheral edema, cyanosis or pallor.  Abdomen:  Soft, nondistended, nontender. No rebound or  guarding. Normal bowel sounds. No appreciable masses or hepatomegaly. Rectal:  Not performed.  Msk:  Symmetrical without gross deformities. Peripheral pulses intact.  Extremities:  Without edema, no deformity or joint abnormality. Normal ROM, normal sensation. Neurologic:  Alert and  oriented x4;  grossly normal neurologically.   Skin:   Dry and intact without significant lesions or rashes. Psychiatric: Demonstrates good judgement and reason without abnormal affect or behaviors.   LAB RESULTS: Recent Labs    11/10/18 1211 11/11/18 0228 11/11/18 0541  WBC 11.5* 11.4* 11.1*  HGB 6.9* 8.1* 8.2*  HCT 23.0* 25.4* 26.2*  PLT 210 199 189   BMET Recent Labs    11/10/18 1211 11/11/18 0228 11/11/18 0541  NA 138 139 139  K 4.0 4.1 4.2  CL 96* 97* 98  CO2 29 30 26   GLUCOSE 152* 80 75  BUN 59* 63* 64*  CREATININE 3.94* 3.87* 3.94*  CALCIUM 7.7* 7.7* 7.7*   LFT Recent Labs    11/10/18 1211  PROT 4.8*  ALBUMIN 2.4*  AST 19  ALT 19  ALKPHOS 69  BILITOT <0.1*   STUDIES: Dg Chest Port 1 View  Result Date: 11/10/2018 CLINICAL DATA:  Dialysis EXAM: PORTABLE CHEST 1 VIEW COMPARISON:  10/04/2018 FINDINGS: Interval placement of a large-bore right neck multi lumen vascular catheter. Unchanged postoperative findings of the right lung with bibasilar atelectasis, consolidation, or scarring and a right pleural effusion. Cardiomegaly. IMPRESSION: 1. Interval placement of a large-bore right neck multi lumen vascular catheter. 2. Unchanged postoperative findings of the right lung with bibasilar atelectasis, consolidation, or scarring and a right pleural effusion. Electronically Signed   By: Eddie Candle M.D.   On: 11/10/2018 19:51    Impression / Plan:   Impression: 1.  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  ESRD on dialysis 3.  O2 dependent 4.  Moderate aortic stenosis due to bicuspid aortic valve  Plan: 1.  We will plan for enteroscopy tomorrow with Dr. Ardis Hughs.  Did discuss risks, benefits, limitations and alternatives and the patient agrees to proceed.  Patient may have capsule endoscopy placed at the same time pending findings. 2.  Ordered half of a movi prep to be given this evening 3.  Patient can be on a clear liquid diet today and n.p.o. after midnight 4.  Appreciate nephrology's assistance with dialysis 5.  Please await any further recommendations from Dr. Ardis Hughs later today.  Thank you for your kind consultation, we will continue to follow.  Lavone Nian Lemmon  11/11/2018, 8:59 AM  ________________________________________________________________________  Velora Heckler GI MD note:  I personally examined the patient, reviewed the data and agree with the assessment and plan described above.  I am planning repeat enteroscopy tomorrow and will probably place SB enteroscopy pill camera as well to evaluate the more distal small bowel.   Owens Loffler, MD [facility] Gastroenterology Pager [phone]

## 2018-11-11 NOTE — Progress Notes (Signed)
Progress Note    Andrew Guerra  GYB:[id number] DOB: 06-Dec-1940  DOA: 11/10/2018 PCP: [name], MD    Brief Narrative:     Medical records reviewed and are as summarized below:  Andrew Guerra is an 78 y.o. 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  Patient states he has no abdominal pain or any diarrhea vomiting.  Does not take any blood thinners.  Has been compliant with dialysis.  Assessment/Plan:   Principal Problem:   Acute GI bleeding Active Problems:   Essential hypertension, benign   Renal malignant tumor (HCC)   GI bleed   PAF (paroxysmal atrial fibrillation) ([place])   Hypertension   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HCC)   Acute blood loss anemia    Acute GI bleed with history of recurrent GI bleed from AVMs  -has received 1 unit of PRBC -Follow CBC.   -GI consult- plan for EGD in AM  ESRD on hemodialysis presently on prednisone  -nephrology for HD  Paroxysmal atrial fibrillation - not on anticoagulation due to GI bleeds -Presently in sinus rhythm.  Acute blood loss anemia -s/p PRBC -trend h/h  Hypertension  -on hydralazine, Coreg and amlodipine.   Family Communication/Anticipated D/C date and plan/Code Status   DVT prophylaxis: scd Code Status: Full Code.  Family Communication:  Disposition Plan: scope in the AM   Medical Consultants:    GI  renal   Anti-Infectives:    None  Subjective:    "how long do I have to be here"  Objective:    Vitals:   11/11/18 1200 11/11/18 1240 11/11/18 1246 11/11/18 1300  BP: (!) 150/91 (!) 159/84 (!) 158/83 (!) 153/77  Pulse: 79 83 85 81  Resp: _0 Temp:      TempSrc:  Oral    SpO2: 97% 96%    Weight:      Height:        Intake/Output Summary (Last 24 hours) at 11/11/2018 1407 Last data filed at 11/10/2018 2325 Gross per 24 hour  Intake 315 ml  Output -  Net 315 ml   Filed Weights   11/11/18 0536  Weight: 87.6 kg    Exam: In bed, NAD rrr No increased work of breathing A+OX3   Data Reviewed:   I have personally reviewed following labs and imaging studies:  Labs: Labs show the following:   Basic Metabolic Panel: Recent Labs  Lab 11/10/18 1211 11/11/18 0228 11/11/18 0541  NA 138 139 139  K 4.0 4.1 4.2  CL 96* 97* 98  CO2 _1 GLUCOSE 152* 80 75  BUN 59* 63* 64*  CREATININE 3.94* 3.87* 3.94*  CALCIUM 7.7* 7.7* 7.7*   GFR Estimated Creatinine Clearance: 16.1 mL/min (A) (by C-G formula based on SCr of 3.94 mg/dL (H)). Liver Function Tests: Recent Labs  Lab 11/10/18 1211  AST 19  ALT 19  ALKPHOS 69  BILITOT <0.1*  PROT 4.8*  ALBUMIN 2.4*   No results for input(s): LIPASE, AMYLASE in the last 168 hours. No results for input(s): AMMONIA in the last 168 hours. Coagulation profile No results for input(s): INR, PROTIME in the last 168 hours.  CBC: Recent Labs  Lab 11/09/18 1516 11/10/18 1211 11/11/18 0228 11/11/18 0541  WBC 9.4 11.5* 11.4* 11.1*  HGB 6.5 Repeated and verified X2.* 6.9* 8.1* 8.2*  HCT 20.1 Repeated and verified X2.* 23.0* 25.4* 26.2*  MCV 84.3 88.5 85.2 87.9  PLT 193.0 210 199 189   Cardiac Enzymes: No results for input(s): CKTOTAL, CKMB, CKMBINDEX, TROPONINI in the last 168 hours. BNP (last 3 results) No results for input(s): PROBNP in the last 8760 hours. CBG: Recent Labs  Lab 11/10/18 2223  GLUCAP 112*   D-Dimer: No results for input(s): DDIMER in the  last 72 hours. Hgb A1c: No results for input(s): HGBA1C in the last 72 hours. Lipid Profile: No results for input(s): CHOL, HDL, LDLCALC, TRIG, CHOLHDL, LDLDIRECT in the last 72 hours. Thyroid function studies: No results for input(s): TSH, T4TOTAL, T3FREE, THYROIDAB in the last 72 hours.  Invalid input(s): FREET3 Anemia work up: No results for input(s): VITAMINB12, FOLATE, FERRITIN, TIBC, IRON, RETICCTPCT in the last 72 hours. Sepsis Labs: Recent Labs  Lab 11/09/18 1516 11/10/18 1211 11/11/18 0228 11/11/18 0541  WBC 9.4 11.5* 11.4* 11.1*    Microbiology Recent Results (from the past 240 hour(s))  SARS CORONAVIRUS 2 (TAT 6-24 HRS) Nasopharyngeal Nasopharyngeal Swab     Status: None   Collection Time: 11/10/18  9:33 PM   Specimen: Nasopharyngeal Swab  Result Value Ref Range Status   SARS Coronavirus 2 NEGATIVE NEGATIVE Final    Comment: (NOTE) SARS-CoV-2 target nucleic acids are NOT DETECTED. The SARS-CoV-2 RNA is generally detectable in upper and lower respiratory specimens during the acute phase of infection. Negative results do not preclude SARS-CoV-2 infection, do not rule out co-infections with other pathogens, and should not be used as the sole basis for treatment or other patient management decisions. Negative results must be combined with clinical observations, patient history, and epidemiological information. The expected result is Negative. Fact Sheet for Patients: SugarRoll.be Fact Sheet for Healthcare Providers: https://www.woods-mathews.com/ This test is not yet approved or cleared by the [place] FDA and  has been authorized for detection and/or diagnosis of SARS-CoV-2 by FDA under an Emergency Use Authorization (EUA). This EUA will remain  in effect (meaning this test can be used) for the duration of the COVID-19 declaration under Section 56 4(b)(1) of the Act, 21 U.S.C. section 360bbb-3(b)(1), unless the  authorization is terminated or revoked sooner. Performed at [facility], [facility] [address] [place], [place] [address]     Procedures and diagnostic studies:  Dg Chest Port 1 View  Result Date: 11/10/2018 CLINICAL DATA:  Dialysis EXAM: PORTABLE CHEST 1 VIEW COMPARISON:  10/04/2018 FINDINGS: Interval placement of a large-bore right neck multi lumen vascular catheter. Unchanged postoperative findings of the right lung with bibasilar atelectasis, consolidation, or scarring and a right pleural effusion. Cardiomegaly. IMPRESSION: 1. Interval placement of a large-bore right neck multi lumen vascular catheter. 2. Unchanged postoperative findings of the right lung with bibasilar atelectasis, consolidation, or scarring and a right pleural effusion. Electronically Signed   By: Eddie Candle M.D.   On: 11/10/2018 19:51    Medications:   . amLODipine  5 mg Oral Daily  . carvedilol  3.125 mg Oral BID WC  . Chlorhexidine Gluconate  Cloth  6 each Topical [id number]  . [START ON 11/17/2018] darbepoetin (ARANESP) injection - DIALYSIS  100 mcg Intravenous Q Fri-HD  . [START ON 11/13/2018] doxercalciferol  2 mcg Intravenous Q M,W,F-HD  . doxercalciferol  2 mcg Intravenous Once in dialysis  . feeding supplement (NEPRO CARB STEADY)  237 mL Oral BID  . hydrALAZINE  25 mg Oral Q8H  . multivitamin  1 tablet Oral QHS  . Netarsudil-Latanoprost  1 drop Both Eyes QHS  . [START ON 11/14/2018] pantoprazole  40 mg Intravenous Q12H  . peg 3350 powder  0.5 kit Oral Once  . predniSONE  60 mg Oral Q breakfast   Continuous Infusions: . sodium chloride Stopped (11/10/18 2233)  . [START ON 11/13/2018] ferric gluconate (FERRLECIT/NULECIT) IV    . pantoprozole (PROTONIX) infusion 8 mg/hr (11/11/18 0551)     LOS: 0 days   Geradine Girt  [facility]   How to contact the [facility] Attending or Consulting provider [place] or covering provider during after hours [place], for this patient?  1. Check the care team in [facility]  and look for a) attending/consulting TR[facility] provider listed and b) the [facility] team listed 2. Log into [email or web address] and use [place]'s universal password to access. If you do not have the password, please contact the hospital operator. 3. Locate the [facility] provider you are looking for under [facility] and page to a number that you can be directly reached. 4. If you still have difficulty reaching the provider, please page the [facility] (Director on Call) for the Hospitalists listed on amion for assistance.  11/11/2018, 2:07 PM

## 2018-11-11 NOTE — Progress Notes (Signed)
NEW ADMISSION NOTE New Admission Note:   Arrival Method: stretcher from HD Mental Orientation: alert and oriented x4  Telemetry: NA Assessment: Completed Skin: dry and intact  IV: RAC Pain: 0 Safety Measures: Safety Fall Prevention Plan has been given, discussed and signed Admission: Completed [facility] [place] Orientation: Patient has been orientated to the room, unit and staff.   Orders have been reviewed and implemented. Will continue to monitor the patient. Call light has been placed within reach and bed alarm has been activated.   Baldo Ash, RN

## 2018-11-12 ENCOUNTER — Encounter (HOSPITAL_COMMUNITY)
Admission: EM | Disposition: A | Discharge status: Home / Self Care | Payer: Self-pay | Source: Home / Self Care | Attending: Internal Medicine

## 2018-11-12 ENCOUNTER — Inpatient Hospital Stay (HOSPITAL_COMMUNITY): Payer: Medicare Other | Admitting: Anesthesiology

## 2018-11-12 ENCOUNTER — Encounter (HOSPITAL_COMMUNITY): Payer: Self-pay | Admitting: Anesthesiology

## 2018-11-12 ENCOUNTER — Encounter: Payer: Self-pay | Admitting: Gastroenterology

## 2018-11-12 DIAGNOSIS — K922 Gastrointestinal hemorrhage, unspecified: Secondary | ICD-10-CM

## 2018-11-12 DIAGNOSIS — K31811 Angiodysplasia of stomach and duodenum with bleeding: Secondary | ICD-10-CM

## 2018-11-12 HISTORY — PX: ENTEROSCOPY: SHX5533

## 2018-11-12 HISTORY — PX: GIVENS CAPSULE STUDY: SHX5432

## 2018-11-12 HISTORY — PX: HOT HEMOSTASIS: SHX5433

## 2018-11-12 HISTORY — PX: HEMOSTASIS CLIP PLACEMENT: SHX6857

## 2018-11-12 LAB — CBC
HCT: 25.8 % — ABNORMAL LOW (ref 39.0–52.0)
Hemoglobin: 8.2 g/dL — ABNORMAL LOW (ref 13.0–17.0)
MCH: 27.2 pg (ref 26.0–34.0)
MCHC: 31.8 g/dL (ref 30.0–36.0)
MCV: 85.4 fL (ref 80.0–100.0)
Platelets: 167 10*3/uL (ref 150–400)
RBC: 3.02 MIL/uL — ABNORMAL LOW (ref 4.22–5.81)
RDW: 18.9 % — ABNORMAL HIGH (ref 11.5–15.5)
WBC: 16.6 10*3/uL — ABNORMAL HIGH (ref 4.0–10.5)
nRBC: 0 % (ref 0.0–0.2)

## 2018-11-12 LAB — BASIC METABOLIC PANEL
Anion gap: 13 (ref 5–15)
BUN: 35 mg/dL — ABNORMAL HIGH (ref 8–23)
CO2: 28 mmol/L (ref 22–32)
Calcium: 7.9 mg/dL — ABNORMAL LOW (ref 8.9–10.3)
Chloride: 98 mmol/L (ref 98–111)
Creatinine, Ser: 2.89 mg/dL — ABNORMAL HIGH (ref 0.61–1.24)
GFR calc Af Amer: 23 mL/min — ABNORMAL LOW (ref >60)
GFR calc non Af Amer: 20 mL/min — ABNORMAL LOW (ref >60)
Glucose, Bld: 82 mg/dL (ref 70–99)
Potassium: 4.1 mmol/L (ref 3.5–5.1)
Sodium: 139 mmol/L (ref 135–145)

## 2018-11-12 LAB — GLUCOSE, CAPILLARY
Glucose-Capillary: 84 mg/dL (ref 70–99)
Glucose-Capillary: 62 mg/dL — ABNORMAL LOW (ref 70–99)
Glucose-Capillary: 88 mg/dL (ref 70–99)

## 2018-11-12 SURGERY — ENTEROSCOPY
Anesthesia: Monitor Anesthesia Care

## 2018-11-12 MED ORDER — ALBUMIN HUMAN 5 % IV SOLN
INTRAVENOUS | Status: DC | PRN
  Administered 2018-11-12: 08:00:00 via INTRAVENOUS

## 2018-11-12 MED ORDER — GLUCAGON HCL RDNA (DIAGNOSTIC) 1 MG IJ SOLR
INTRAMUSCULAR | Status: AC
  Filled 2018-11-12: qty 1

## 2018-11-12 MED ORDER — LIDOCAINE 2% (20 MG/ML) 5 ML SYRINGE
INTRAMUSCULAR | Status: DC | PRN
  Administered 2018-11-12: 40 mg via INTRAVENOUS

## 2018-11-12 MED ORDER — PHENYLEPHRINE 40 MCG/ML (10ML) SYRINGE FOR IV PUSH (FOR BLOOD PRESSURE SUPPORT)
PREFILLED_SYRINGE | INTRAVENOUS | Status: DC | PRN
  Administered 2018-11-12: 80 ug via INTRAVENOUS
  Administered 2018-11-12: 120 ug via INTRAVENOUS
  Administered 2018-11-12 (×2): 80 ug via INTRAVENOUS
  Administered 2018-11-12 (×2): 120 ug via INTRAVENOUS
  Administered 2018-11-12: 40 ug via INTRAVENOUS
  Administered 2018-11-12: 120 ug via INTRAVENOUS

## 2018-11-12 MED ORDER — GLUCAGON HCL RDNA (DIAGNOSTIC) 1 MG IJ SOLR
INTRAMUSCULAR | Status: DC | PRN
  Administered 2018-11-12: .5 mg via INTRAVENOUS

## 2018-11-12 MED ORDER — PROPOFOL 500 MG/50ML IV EMUL
INTRAVENOUS | Status: DC | PRN
  Administered 2018-11-12: 50 ug/kg/min via INTRAVENOUS

## 2018-11-12 MED ORDER — PROPOFOL 10 MG/ML IV BOLUS
INTRAVENOUS | Status: DC | PRN
  Administered 2018-11-12 (×3): 20 mg via INTRAVENOUS
  Administered 2018-11-12: 25 mg via INTRAVENOUS

## 2018-11-12 NOTE — Anesthesia Preprocedure Evaluation (Signed)
Anesthesia Evaluation   Patient awake    Reviewed: Allergy & Precautions, NPO status , Patient's Chart, lab work & pertinent test results, reviewed documented beta blocker date and time   History of Anesthesia Complications Negative for: history of anesthetic complications  Airway Mallampati: II  TM Distance: >3 FB Neck ROM: Full    Dental  (+) Edentulous Upper, Edentulous Lower   Pulmonary neg shortness of breath, neg recent URI, former smoker,    breath sounds clear to auscultation       Cardiovascular hypertension, Pt. on medications and Pt. on home beta blockers +CHF  + dysrhythmias + Valvular Problems/Murmurs  Rhythm:Regular   1. Left ventricular ejection fraction, by visual estimation, is 55 to 60%. The left ventricle has normal function. Normal left ventricular size. Left ventricular septal wall thickness was mildly increased. Mildly increased left ventricular posterior  wall thickness. There is mildly increased left ventricular hypertrophy.  2. Basal and mid inferior wall and basal and mid inferolateral wall are abnormal.  3. Right ventricular pressure overload.  4. Global right ventricle has normal systolic function.The right ventricular size is normal. No increase in right ventricular wall thickness.  5. Left atrial size was severely dilated.  6. Right atrial size was severely dilated.  7. Large pleural effusion in the left lateral region.  8. Mild mitral annular calcification.  9. The mitral valve is normal in structure. No evidence of mitral valve regurgitation. No evidence of mitral stenosis. 10. The tricuspid valve is normal in structure. Tricuspid valve regurgitation is mild. 11. The aortic valve The aortic valve is normal in structure. Aortic valve regurgitation is mild to moderate by color flow Doppler. Moderate aortic valve stenosis. 12. The pulmonic valve was normal in structure. Pulmonic valve regurgitation is mild  by color flow Doppler. 13. Moderately elevated pulmonary artery systolic pressure. 14. The inferior vena cava is dilated in size with <50% respiratory variability, suggesting right atrial pressure of 15 mmHg   Neuro/Psych negative neurological ROS  negative psych ROS   GI/Hepatic hiatal hernia, PUD, ? GI bleed   Endo/Other  negative endocrine ROS  Renal/GU ESRF and DialysisRenal diseaseHD Sat     Musculoskeletal   Abdominal   Peds  Hematology  (+) Blood dyscrasia, anemia ,   Anesthesia Other Findings   Reproductive/Obstetrics                             Anesthesia Physical Anesthesia Plan  ASA: III  Anesthesia Plan: MAC   Post-op Pain Management:    Induction: Intravenous  PONV Risk Score and Plan: 1 and Treatment may vary due to age or medical condition and Propofol infusion  Airway Management Planned: Nasal Cannula  Additional Equipment: None  Intra-op Plan:   Post-operative Plan:   Informed Consent: I have reviewed the patients History and Physical, chart, labs and discussed the procedure including the risks, benefits and alternatives for the proposed anesthesia with the patient or authorized representative who has indicated his/her understanding and acceptance.     Dental advisory given  Plan Discussed with: CRNA and Surgeon  Anesthesia Plan Comments:         Anesthesia Quick Evaluation

## 2018-11-12 NOTE — Discharge Summary (Signed)
Physician Discharge Summary  SONU KRUCKENBERG LSL:[id number] DOB: August 24, 1940 DOA: 11/10/2018  PCP: [name], MD  Admit date: 11/10/2018 Discharge date: 11/12/2018  Admitted From: home Discharge disposition: home   Recommendations for Outpatient Follow-Up:   1. Capsule endoscopy placed 10/25 2. Holding ASA until GI follow up-- defer restart to Dr. Tarri Glenn 3. Cbc with in 1 week   Discharge Diagnosis:   Principal Problem:   Acute GI bleeding Active Problems:   Essential hypertension, benign   Renal malignant tumor (HCC)   GI bleed   PAF (paroxysmal atrial fibrillation) ([place])   Hypertension   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[place])   Acute blood loss anemia    Discharge Condition: Improved.  Diet recommendation: Low sodium, heart healthy  Wound care: None.  Code status: Full.   History of Present Illness:   Andrew Guerra is a 78 y.o. 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  Patient states he has no abdominal pain or any diarrhea vomiting.  Does not take any blood thinners.  Has been compliant with dialysis.   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 This was treated with APC and then a single endoclip was applied. There  was hematin downstream from this lesion but no  other AVMs were noted (to approximately the mid  jejunum).    -  Medium to large hiatal hernia without Cameron's erosions. Small Bowel Capsule Camera  [facility] will followup with capsule reading   ESRD -regular HD sessions  Paroxysmal atrial fibrillation - not on anticoagulation due to GI bleeds -Presently in sinus rhythm.  Acute blood loss anemia -s/p PRBC -stable h/h  Hypertension  -on hydralazine, Coreg and amlodipine.  Medical Consultants:    GI   Discharge Exam:   Vitals:   11/12/18 0843 11/12/18 0922  BP: 135/64 (!) 152/65  Pulse:  83  Resp: (!) 26 18  Temp:  97.6 F (36.4 C)  SpO2:  100%   Vitals:   11/12/18 0829 11/12/18 0833 11/12/18 0843 11/12/18 0922  BP: 122/63 (!) 119/56 135/64 (!) 152/65  Pulse: 80   83  Resp: 16 (!) 22 (!) 26 18  Temp: 97.7 F (36.5 C)   97.6 F (36.4 C)  TempSrc: Temporal   Oral  SpO2: 96% 96%  100%  Weight:      Height:        General exam: Appears calm and comfortable.   The results of significant diagnostics from this hospitalization (including imaging, microbiology, ancillary and laboratory) are listed below for reference.     Procedures and Diagnostic Studies:   Dg Chest Port 1 View  Result Date: 11/10/2018 CLINICAL DATA:  Dialysis EXAM: PORTABLE CHEST 1 VIEW COMPARISON:  10/04/2018 FINDINGS: Interval placement of a large-bore right neck multi lumen vascular catheter. Unchanged postoperative findings of  the right lung with bibasilar atelectasis, consolidation, or scarring and a right pleural effusion. Cardiomegaly. IMPRESSION: 1. Interval placement of a large-bore right neck multi lumen vascular catheter. 2. Unchanged postoperative findings of the right lung with bibasilar atelectasis, consolidation, or scarring and a right pleural effusion. Electronically Signed   By: Eddie Candle M.D.   On: 11/10/2018 19:51     Labs:   Basic Metabolic Panel: Recent Labs  Lab 11/10/18 1211 11/11/18 0228 11/11/18 0541 11/12/18 0459  NA 138 139 139 139  K 4.0 4.1 4.2 4.1  CL 96* 97*  98 98  CO2 29 30 26 28   GLUCOSE 152* 80 75 82  BUN 59* 63* 64* 35*  CREATININE 3.94* 3.87* 3.94* 2.89*  CALCIUM 7.7* 7.7* 7.7* 7.9*   GFR Estimated Creatinine Clearance: 21.9 mL/min (A) (by C-G formula based on SCr of 2.89 mg/dL (H)). Liver Function Tests: Recent Labs  Lab 11/10/18 1211  AST 19  ALT 19  ALKPHOS 69  BILITOT <0.1*  PROT 4.8*  ALBUMIN 2.4*   No results for input(s): LIPASE, AMYLASE in the last 168 hours. No results for input(s): AMMONIA in the last 168 hours. Coagulation profile No results for input(s): INR, PROTIME in the last 168 hours.  CBC: Recent Labs  Lab 11/09/18 1516 11/10/18 1211 11/11/18 0228 11/11/18 0541 11/12/18 0459  WBC 9.4 11.5* 11.4* 11.1* 16.6*  HGB 6.5 Repeated and verified X2.* 6.9* 8.1* 8.2* 8.2*  HCT 20.1 Repeated and verified X2.* 23.0* 25.4* 26.2* 25.8*  MCV 84.3 88.5 85.2 87.9 85.4  PLT 193.0 210 199 189 167   Cardiac Enzymes: No results for input(s): CKTOTAL, CKMB, CKMBINDEX, TROPONINI in the last 168 hours. BNP: Invalid input(s): POCBNP CBG: Recent Labs  Lab 11/10/18 2223 11/11/18 1758 11/11/18 2351 11/12/18 0529 11/12/18 1138  GLUCAP 112* 89 81 84 62*   D-Dimer No results for input(s): DDIMER in the last 72 hours. Hgb A1c No results for input(s): HGBA1C in the last 72 hours. Lipid Profile No results for input(s): CHOL, HDL, LDLCALC, TRIG, CHOLHDL, LDLDIRECT in the last 72 hours. Thyroid function studies No results for input(s): TSH, T4TOTAL, T3FREE, THYROIDAB in the last 72 hours.  Invalid input(s): FREET3 Anemia work up No results for input(s): VITAMINB12, FOLATE, FERRITIN, TIBC, IRON, RETICCTPCT in the last 72 hours. Microbiology Recent Results (from the past 240 hour(s))  SARS CORONAVIRUS 2 (TAT 6-24 HRS) Nasopharyngeal Nasopharyngeal Swab     Status: None   Collection Time: 11/10/18  9:33 PM   Specimen: Nasopharyngeal Swab  Result Value Ref Range Status   SARS Coronavirus 2 NEGATIVE NEGATIVE Final     Comment: (NOTE) SARS-CoV-2 target nucleic acids are NOT DETECTED. The SARS-CoV-2 RNA is generally detectable in upper and lower respiratory specimens during the acute phase of infection. Negative results do not preclude SARS-CoV-2 infection, do not rule out co-infections with other pathogens, and should not be used as the sole basis for treatment or other patient management decisions. Negative results must be combined with clinical observations, patient history, and epidemiological information. The expected result is Negative. Fact Sheet for Patients: SugarRoll.be Fact Sheet for Healthcare Providers: https://www.woods-mathews.com/ This test is not yet approved or cleared by the [place] FDA and  has been authorized for detection and/or diagnosis of SARS-CoV-2 by FDA under an Emergency Use Authorization (EUA). This EUA will remain  in effect (meaning this test can be used) for the duration of the COVID-19 declaration under Section 56 4(b)(1) of the Act, 21 U.S.C. section  360bbb-3(b)(1), unless the authorization is terminated or revoked sooner. Performed at [facility], [place] [address], [place], [place] [address]      Discharge Instructions:   Discharge Instructions    Diet - low sodium heart healthy   Complete by: As directed    Increase activity slowly   Complete by: As directed      Allergies as of 11/12/2018      Reactions   Ambien [zolpidem Tartrate] Other (See Comments)   Has hallucinations and Homicidal Ideations per Wife    Lipitor [atorvastatin] Other (See Comments)   Achy and tired      Medication List    STOP taking these medications   aspirin 81 MG EC tablet   predniSONE 20 MG tablet Commonly known as: DELTASONE     TAKE these medications   amLODipine 5 MG tablet Commonly known as: NORVASC Take 1 tablet (5 mg total) by mouth daily.   Auryxia 1 GM 210 MG(Fe) tablet Generic drug: ferric citrate  Take 420 mg by mouth 3 (three) times daily with meals.   carvedilol 3.125 MG tablet Commonly known as: COREG Take 1 tablet (3.125 mg total) by mouth 2 (two) times daily with a meal.   feeding supplement (NEPRO CARB STEADY) Liqd Take 237 mLs by mouth 2 (two) times daily.   hydrALAZINE 25 MG tablet Commonly known as: APRESOLINE Take 1 tablet (25 mg total) by mouth every 8 (eight) hours.   multivitamin Tabs tablet Take 1 tablet by mouth daily.   pantoprazole 40 MG tablet Commonly known as: PROTONIX TAKE 1 TABLET BY MOUTH TWICE A DAY   Rocklatan 0.02-0.005 % Soln Generic drug: Netarsudil-Latanoprost Place 1 drop into both eyes at bedtime.   Simbrinza 1-0.2 % Susp Generic drug: Brinzolamide-Brimonidine Place 2-3 drops into both eyes 3 (three) times daily.   Xiidra 5 % Soln Generic drug: Lifitegrast Place 1 drop into both eyes 2 (two) times daily as needed (dry eyes).      Follow-up Information    [name], MD Follow up in 1 week(s).   Specialty: Family Medicine Contact information: [place] [address] [phone]        Sueanne Margarita, MD .   Specialty: Cardiology Contact information: [address] [place] [address] [phone]        [name], MD. Schedule an appointment as soon as possible for a visit.   Specialty: Gastroenterology Contact information: [place]  [address] [phone]            Time coordinating discharge: 35 min  Signed:  Geradine Girt DO  [facility] 11/12/2018, 3:24 PM

## 2018-11-12 NOTE — Anesthesia Postprocedure Evaluation (Signed)
Anesthesia Post Note  Patient: Andrew Guerra  Procedure(s) Performed: ENTEROSCOPY (N/A ) HOT HEMOSTASIS (ARGON PLASMA COAGULATION/BICAP) (N/A ) HEMOSTASIS CLIP PLACEMENT     Patient location during evaluation: Endoscopy Anesthesia Type: MAC Level of consciousness: awake and alert Pain management: pain level controlled Vital Signs Assessment: post-procedure vital signs reviewed and stable Respiratory status: spontaneous breathing, nonlabored ventilation, respiratory function stable and patient connected to nasal cannula oxygen Cardiovascular status: stable and blood pressure returned to baseline Postop Assessment: no apparent nausea or vomiting Anesthetic complications: no    Last Vitals:  Vitals:   11/12/18 0833 11/12/18 0843  BP: (!) 119/56 135/64  Pulse:    Resp: (!) 22 (!) 26  Temp:    SpO2: 96%     Last Pain:  Vitals:   11/12/18 0829  TempSrc: Temporal  PainSc:                  Treacy Holcomb

## 2018-11-12 NOTE — Anesthesia Procedure Notes (Signed)
Procedure Name: MAC Date/Time: 11/12/2018 8:04 AM Performed by: Renato Shin, CRNA Pre-anesthesia Checklist: Patient identified, Emergency Drugs available, Suction available and Patient being monitored Patient Re-evaluated:Patient Re-evaluated prior to induction Oxygen Delivery Method: Nasal cannula Preoxygenation: Pre-oxygenation with 100% oxygen Induction Type: IV induction Placement Confirmation: positive ETCO2 and breath sounds checked- equal and bilateral Dental Injury: Teeth and Oropharynx as per pre-operative assessment

## 2018-11-12 NOTE — Transfer of Care (Signed)
Immediate Anesthesia Transfer of Care Note  Patient: Andrew Guerra  Procedure(s) Performed: ENTEROSCOPY (N/A ) HOT HEMOSTASIS (ARGON PLASMA COAGULATION/BICAP) (N/A ) HEMOSTASIS CLIP PLACEMENT  Patient Location: PACU and Endoscopy Unit  Anesthesia Type:MAC  Level of Consciousness: awake, alert  and patient cooperative  Airway & Oxygen Therapy: Patient Spontanous Breathing and Patient connected to nasal cannula oxygen  Post-op Assessment: Report given to RN and Post -op Vital signs reviewed and stable  Post vital signs: Reviewed and stable  Last Vitals:  Vitals Value Taken Time  BP 119/56 11/12/18 0833  Temp 36.5 C 11/12/18 0829  Pulse 80 11/12/18 0829  Resp 18 11/12/18 0834  SpO2 96 % 11/12/18 0829  Vitals shown include unvalidated device data.  Last Pain:  Vitals:   11/12/18 0829  TempSrc: Temporal  PainSc:          Complications: No apparent anesthesia complications

## 2018-11-12 NOTE — Op Note (Signed)
[facility] Patient Name: Andrew Guerra Procedure Date : 11/12/2018 MRN: [id number] Attending MD: Milus Banister , MD Date of Birth: March 02, 1940 CSN: [id number] Age: 78 Admit Type: Inpatient Procedure:                Small bowel enteroscopy Indications:              Chronic IDA, chronic dark stools, known SB AVMs s/p                            enteroscopy 06/2018 with APC treatment to 7 UGI AVMs Providers:                Milus Banister, MD, Glori Bickers, RN, Laverda Sorenson, Technician, Luane School, CRNA Referring MD:              Medicines:                Monitored Anesthesia Care Complications:            No immediate complications. Estimated blood loss:                            None. Estimated Blood Loss:     Estimated blood loss: none. Procedure:                Pre-Anesthesia Assessment:                           - Prior to the procedure, a History and Physical                            was performed, and patient medications and                            allergies were reviewed. The patient's tolerance of                            previous anesthesia was also reviewed. The risks                            and benefits of the procedure and the sedation                            options and risks were discussed with the patient.                            All questions were answered, and informed consent                            was obtained. Prior Anticoagulants: The patient has                            taken no previous anticoagulant or antiplatelet  agents. ASA Grade Assessment: III - A patient with                            severe systemic disease. After reviewing the risks                            and benefits, the patient was deemed in                            satisfactory condition to undergo the procedure.                           After obtaining informed consent, the endoscope was                  passed under direct vision. Throughout the                            procedure, the patient's blood pressure, pulse, and                            oxygen saturations were monitored continuously. The                            [id number]-[id number] ([id number]) Olympus pediatric colonoscope                            was introduced through the mouth and advanced to                            the proximal jejunum. The small bowel enteroscopy                            was accomplished without difficulty. The patient                            tolerated the procedure well. Scope In: Scope Out: Findings:      Medium to large hiatal hernia without obvious Cameron's erosions.      Single slightly umbilicated AVM just distal to the dudoenal bulb with       slow oozing of blood. This was clearly distinct from the major and minor       papillas and it was treated with APC. It didn't seem like the APC was       adequate given the slight umbilication and so I placed a single endo       clip at the site as well.      The remaining examination, well into the jejunum was normal except for       hematin appearing fluid (possibly from the more proximal oozing AVM) Impression:               - Slowly oozing AVM just distal to the duodenal                            bulb, slightly umbilicated. This was treated with  APC and then a single endoclip was applied. There                            was hematin downstream from this lesion but no                            other AVMs were noted (to approximately the mid                            jejunum).                           - Medium to large hiatal hernia without Cameron's                            erosions.                           - The examination was otherwise normal. Recommendation:           - We are having him swallow a Small Bowel Capsule                            Camera in recovery. He is very eager to go home                             today and so as soon as the protocol time for SBCE                            is completed (later this afternoon) he is safe for                            d/c as far as I am concered.                           - [facility] will followup with capsule reading                            (Dr. Tarri Glenn is his primary GI) with advice to                            follow afterwards.                           - Please call or page with any further questions or                            concerns. Procedure Code(s):        --- Professional ---                           [phone], Small intestinal endoscopy, enteroscopy  beyond second portion of duodenum, not including                            ileum; diagnostic, including collection of                            specimen(s) by brushing or washing, when performed                            (separate procedure) Diagnosis Code(s):        --- Professional ---                           K31.811, Angiodysplasia of stomach and duodenum                            with bleeding                           K92.1, Melena (includes Hematochezia) CPT copyright 2019 American Medical Association. All rights reserved. The codes documented in this report are preliminary and upon coder review may  be revised to meet current compliance requirements. Milus Banister, MD 11/12/2018 8:33:48 AM This report has been signed electronically. Number of Addenda: 0

## 2018-11-12 NOTE — Interval H&P Note (Signed)
History and Physical Interval Note:  11/12/2018 7:28 AM  Andrew Guerra  has presented today for surgery, with the diagnosis of IDA.  The various methods of treatment have been discussed with the patient and family. After consideration of risks, benefits and other options for treatment, the patient has consented to  Procedure(s): ENTEROSCOPY (N/A) as a surgical intervention.  The patient's history has been reviewed, patient examined, no change in status, stable for surgery.  I have reviewed the patient's chart and labs.  Questions were answered to the patient's satisfaction.     Milus Banister

## 2018-11-12 NOTE — Progress Notes (Signed)
[facility] Progress Note   Dialysis Orders: MWF NW 4.25 hours 400/800 EDW 88 2K 2 Ca TDC right IJ TDC - no heparin Mircera 150 q 2 weeks next dose - last had Mircera 75 10/17  - venofer 100x 8 started 10/12 Hectorol 2   Assessment/Plan: 1. 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 ESRD -  MWF - HD Saturday due to missed Friday - net UF 3 L - he was not weighed by dialysis staff- am weight 87 below edw - anticipate d/c today but HD orders written in case something changes. 3. Hypertension/volume  - BP high - tends high at outpatient unit - gets to edw -BP generally comes down during HD on coreg 3.125 bid and hydral 25 tid and amlodipine 5  - needs more volume down - maybe can back off on meds to help with UF- continue to titrate further at outpatient HD unit - 4. Anemia  - hgb 8.2 stable  s/p one unit PRBC - continue ESA - dose Mircera recently increased from 75 > 150 - due for redose 10.30 - continue IV Fe 5. Metabolic bone disease -  Continue VDRA;  resume Aurixya 1 ac when off CL 6. Nutrition - CL for now pending GI work up 7. Leukocytosis - WBC ^ during hospitalization afebrile-follow - mild erythema in LLE 8. Disp - anticipate d/c today and follow up with home HD unit tomorrow  Myriam Jacobson, PA-C [place] [phone] 11/12/2018,9:23 AM  LOS: 1 day   Subjective:   Hopes to go home at the end of the day.  Feels like LLE is getting better.  Had previous weeping from leg - none at present.   Objective Vitals:   11/12/18 0829 11/12/18 0833 11/12/18 0843 11/12/18 0922  BP: 122/63 (!) 119/56 135/64 (!) 152/65  Pulse: 80   83  Resp: 16 (!) 22 (!) 26 18  Temp: 97.7 F (36.5 C)   97.6 F (36.4 C)  TempSrc: Temporal   Oral  SpO2: 96% 96%  100%  Weight:      Height:       Physical Exam General: breathing easily  Sitting on side of bed Heart: RRR 3/6  murmur Lungs: no rales  Abdomen: obese soft Extremities 2 + right 3 + left more swollen than right - mild upper shin erythema - nontender Dialysis Access:  Right IJ [facility]   Additional Objective Labs: Basic Metabolic Panel: Recent Labs  Lab 11/11/18 0228 11/11/18 0541 11/12/18 0459  NA 139 139 139  K 4.1 4.2 4.1  CL 97* 98 98  CO2 30 26 28   GLUCOSE 80 75 82  BUN 63* 64* 35*  CREATININE 3.87* 3.94* 2.89*  CALCIUM 7.7* 7.7* 7.9*   Liver Function Tests: Recent Labs  Lab 11/10/18 1211  AST 19  ALT 19  ALKPHOS 69  BILITOT <0.1*  PROT 4.8*  ALBUMIN 2.4*   No results for input(s): LIPASE, AMYLASE in the last 168 hours. CBC: Recent Labs  Lab 11/09/18 1516 11/10/18 1211 11/11/18 0228 11/11/18 0541 11/12/18 0459  WBC 9.4 11.5* 11.4* 11.1* 16.6*  HGB 6.5 Repeated and verified X2.* 6.9* 8.1* 8.2* 8.2*  HCT 20.1 Repeated and verified X2.* 23.0* 25.4* 26.2* 25.8*  MCV 84.3 88.5 85.2 87.9 85.4  PLT 193.0 210 199 189 167  CBG: Recent Labs  Lab 11/10/18 2223 11/11/18 1758 11/11/18 2351  11/12/18 0529  GLUCAP 112* 89 81 84   Iron Studies: No results for input(s): IRON, TIBC, TRANSFERRIN, FERRITIN in the last 72 hours. Lab Results  Component Value Date   INR 1.1 10/11/2018   INR 1.1 10/04/2018   INR 1.1 05/15/2018   Studies/Results: Dg Chest Port 1 View  Result Date: 11/10/2018 CLINICAL DATA:  Dialysis EXAM: PORTABLE CHEST 1 VIEW COMPARISON:  10/04/2018 FINDINGS: Interval placement of a large-bore right neck multi lumen vascular catheter. Unchanged postoperative findings of the right lung with bibasilar atelectasis, consolidation, or scarring and a right pleural effusion. Cardiomegaly. IMPRESSION: 1. Interval placement of a large-bore right neck multi lumen vascular catheter. 2. Unchanged postoperative findings of the right lung with bibasilar atelectasis, consolidation, or scarring and a right pleural effusion. Electronically Signed   By: Eddie Candle M.D.   On:  11/10/2018 19:51   Medications: . [START ON 11/13/2018] ferric gluconate (FERRLECIT/NULECIT) IV    . pantoprozole (PROTONIX) infusion 8 mg/hr (11/12/18 0526)   . amLODipine  5 mg Oral Daily  . carvedilol  3.125 mg Oral BID WC  . Chlorhexidine Gluconate Cloth  6 each Topical Q0600  . [START ON 11/17/2018] darbepoetin (ARANESP) injection - DIALYSIS  100 mcg Intravenous Q Fri-HD  . [START ON 11/13/2018] doxercalciferol  2 mcg Intravenous Q M,W,F-HD  . doxercalciferol  2 mcg Intravenous Once in dialysis  . feeding supplement (NEPRO CARB STEADY)  237 mL Oral BID  . hydrALAZINE  25 mg Oral Q8H  . multivitamin  1 tablet Oral QHS  . Netarsudil-Latanoprost  1 drop Both Eyes QHS  . [START ON 11/14/2018] pantoprazole  40 mg Intravenous Q12H  . predniSONE  60 mg Oral Q breakfast

## 2018-11-12 NOTE — Plan of Care (Signed)
  Problem: Activity: Goal: Risk for activity intolerance will decrease Outcome: Progressing   

## 2018-11-12 NOTE — Progress Notes (Signed)
Discharge home via wheelchair.

## 2018-11-13 ENCOUNTER — Telehealth: Payer: Self-pay | Admitting: Family Medicine

## 2018-11-13 ENCOUNTER — Encounter (HOSPITAL_COMMUNITY): Payer: Self-pay | Admitting: Gastroenterology

## 2018-11-13 NOTE — Telephone Encounter (Signed)
Very good thank you ?

## 2018-11-13 NOTE — Telephone Encounter (Signed)
Spoke with wife who stated he has follow up blood work and appt with GI doctor 11/20/2018

## 2018-11-13 NOTE — Telephone Encounter (Signed)
Nurses Patient was just in the hospital with GI bleed He will be doing follow-up with gastroenterology The patient should do some follow-up lab work.  Find out from the patient if they have arranged for him to do lab work?  If not set patient up to get labs through [place] If he does lab work on Thursday we should have results on Friday If possible Thursday patient should get metabolic 7, CBC Reason GI bleed, iron deficient anemia, renal insufficiency Also patient can do a follow-up virtual visit Wednesday or Thursday

## 2018-11-14 ENCOUNTER — Other Ambulatory Visit: Payer: Self-pay | Admitting: Emergency Medicine

## 2018-11-14 DIAGNOSIS — D649 Anemia, unspecified: Secondary | ICD-10-CM

## 2018-11-14 LAB — BPAM RBC
Blood Product Expiration Date: 202011172359
Blood Product Expiration Date: 202011172359
Blood Product Expiration Date: 202011172359
ISSUE DATE / TIME: 202010232002
Unit Type and Rh: 7300
Unit Type and Rh: 7300
Unit Type and Rh: 7300

## 2018-11-14 LAB — TYPE AND SCREEN
ABO/RH(D): B POS
Antibody Screen: NEGATIVE
Unit division: 0
Unit division: 0
Unit division: 0

## 2018-11-17 ENCOUNTER — Emergency Department (HOSPITAL_COMMUNITY)
Admission: EM | Admit: 2018-11-17 | Discharge: 2018-11-18 | Disposition: A | Discharge status: Home / Self Care | Payer: Medicare Other | Attending: Emergency Medicine | Admitting: Emergency Medicine

## 2018-11-17 DIAGNOSIS — N186 End stage renal disease: Secondary | ICD-10-CM | POA: Diagnosis not present

## 2018-11-17 DIAGNOSIS — I132 Hypertensive heart and chronic kidney disease with heart failure and with stage 5 chronic kidney disease, or end stage renal disease: Secondary | ICD-10-CM | POA: Diagnosis not present

## 2018-11-17 DIAGNOSIS — I4891 Unspecified atrial fibrillation: Secondary | ICD-10-CM | POA: Insufficient documentation

## 2018-11-17 DIAGNOSIS — Z85118 Personal history of other malignant neoplasm of bronchus and lung: Secondary | ICD-10-CM | POA: Diagnosis not present

## 2018-11-17 DIAGNOSIS — K921 Melena: Secondary | ICD-10-CM | POA: Diagnosis not present

## 2018-11-17 DIAGNOSIS — I5032 Chronic diastolic (congestive) heart failure: Secondary | ICD-10-CM | POA: Diagnosis not present

## 2018-11-17 DIAGNOSIS — Z87891 Personal history of nicotine dependence: Secondary | ICD-10-CM | POA: Insufficient documentation

## 2018-11-17 DIAGNOSIS — D500 Iron deficiency anemia secondary to blood loss (chronic): Secondary | ICD-10-CM | POA: Insufficient documentation

## 2018-11-17 DIAGNOSIS — D649 Anemia, unspecified: Secondary | ICD-10-CM | POA: Diagnosis present

## 2018-11-17 DIAGNOSIS — Z79899 Other long term (current) drug therapy: Secondary | ICD-10-CM | POA: Insufficient documentation

## 2018-11-17 DIAGNOSIS — Z992 Dependence on renal dialysis: Secondary | ICD-10-CM | POA: Insufficient documentation

## 2018-11-17 LAB — CBC WITH DIFFERENTIAL/PLATELET
Abs Immature Granulocytes: 0.09 10*3/uL — ABNORMAL HIGH (ref 0.00–0.07)
Basophils Absolute: 0 10*3/uL (ref 0.0–0.1)
Basophils Relative: 0 %
Eosinophils Absolute: 0.1 10*3/uL (ref 0.0–0.5)
Eosinophils Relative: 1 %
HCT: 24.2 % — ABNORMAL LOW (ref 39.0–52.0)
Hemoglobin: 7.4 g/dL — ABNORMAL LOW (ref 13.0–17.0)
Immature Granulocytes: 1 %
Lymphocytes Relative: 9 %
Lymphs Abs: 0.7 10*3/uL (ref 0.7–4.0)
MCH: 26.9 pg (ref 26.0–34.0)
MCHC: 30.6 g/dL (ref 30.0–36.0)
MCV: 88 fL (ref 80.0–100.0)
Monocytes Absolute: 0.4 10*3/uL (ref 0.1–1.0)
Monocytes Relative: 5 %
Neutro Abs: 6.7 10*3/uL (ref 1.7–7.7)
Neutrophils Relative %: 84 %
Platelets: 147 10*3/uL — ABNORMAL LOW (ref 150–400)
RBC: 2.75 MIL/uL — ABNORMAL LOW (ref 4.22–5.81)
RDW: 19.9 % — ABNORMAL HIGH (ref 11.5–15.5)
WBC: 7.9 10*3/uL (ref 4.0–10.5)
nRBC: 0 % (ref 0.0–0.2)

## 2018-11-17 LAB — COMPREHENSIVE METABOLIC PANEL
ALT: 22 U/L (ref 0–44)
AST: 27 U/L (ref 15–41)
Albumin: 2.2 g/dL — ABNORMAL LOW (ref 3.5–5.0)
Alkaline Phosphatase: 80 U/L (ref 38–126)
Anion gap: 14 (ref 5–15)
BUN: 18 mg/dL (ref 8–23)
CO2: 29 mmol/L (ref 22–32)
Calcium: 7.1 mg/dL — ABNORMAL LOW (ref 8.9–10.3)
Chloride: 96 mmol/L — ABNORMAL LOW (ref 98–111)
Creatinine, Ser: 2.24 mg/dL — ABNORMAL HIGH (ref 0.61–1.24)
GFR calc Af Amer: 31 mL/min — ABNORMAL LOW (ref >60)
GFR calc non Af Amer: 27 mL/min — ABNORMAL LOW (ref >60)
Glucose, Bld: 99 mg/dL (ref 70–99)
Potassium: 2.8 mmol/L — ABNORMAL LOW (ref 3.5–5.1)
Sodium: 139 mmol/L (ref 135–145)
Total Bilirubin: 0.5 mg/dL (ref 0.3–1.2)
Total Protein: 5.1 g/dL — ABNORMAL LOW (ref 6.5–8.1)

## 2018-11-17 LAB — LACTIC ACID, PLASMA: Lactic Acid, Venous: 1.9 mmol/L (ref 0.5–1.9)

## 2018-11-17 LAB — TYPE AND SCREEN
ABO/RH(D): B POS
Antibody Screen: NEGATIVE

## 2018-11-17 MED ORDER — SODIUM CHLORIDE 0.9% FLUSH: 3.0000 mL | Freq: Once | INTRAVENOUS | Status: DC

## 2018-11-17 NOTE — ED Triage Notes (Signed)
Pt to ED via POV, Reports was at his scheduled apt at [facility] and was told his blood was low, Pt comes with lab result hgb 6.6, Pt was just in the hospital last weekend.

## 2018-11-18 LAB — CBC
HCT: 24.8 % — ABNORMAL LOW (ref 39.0–52.0)
Hemoglobin: 7.6 g/dL — ABNORMAL LOW (ref 13.0–17.0)
MCH: 26.9 pg (ref 26.0–34.0)
MCHC: 30.6 g/dL (ref 30.0–36.0)
MCV: 87.6 fL (ref 80.0–100.0)
Platelets: 154 K/uL (ref 150–400)
RBC: 2.83 MIL/uL — ABNORMAL LOW (ref 4.22–5.81)
RDW: 19.5 % — ABNORMAL HIGH (ref 11.5–15.5)
WBC: 9.1 K/uL (ref 4.0–10.5)
nRBC: 0 % (ref 0.0–0.2)

## 2018-11-18 NOTE — ED Provider Notes (Signed)
[facility] Provider Note  CSN: [id number] Arrival date & time: 11/17/18 1728  Chief Complaint(s) Abnormal Lab (hgb 6.6)  HPI Andrew Guerra is a 78 y.o. male with extensive past medical history listed below including ESRD on dialysis Monday Wednesday Friday, chronic GI bleed from AVM who was admitted last week for symptomatic anemia requiring transfusion is here from HD to be evalutated for anemia. He had test notable for Hb 6.6. he denies CP, SOB, fatigue. Admits to continued melenic stools which have been ongoing for weeks.  HPI  Past Medical History Past Medical History:  Diagnosis Date  . 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  [name] Cataract    right eye - surgery to remove  . Elevated PSA 11/29/2016   Patient is followed by [facility].  Most recent PSA near 11.  He has had atypia on biopsy.  November 2008  . Essential hypertension, benign 06/08/2012  . Full dentures   . GI bleed   . Glaucoma   . Heart murmur    since childhood, never has caused any problems  . History of rheumatic fever 11/04/2015  . Hyperlipidemia 04/25/2013  . Hypertension   . Malignant neoplasm of left lung ([place]) 02/08/2017  . RBBB 07/12/2017  . Sickle cell trait ([place])    no problems per patient   Patient Active Problem List   Diagnosis Date Noted  . Acute GI bleeding 11/10/2018  . Acute blood loss anemia 11/10/2018  . AVM (arteriovenous malformation) of small bowel, acquired 11/09/2018  . Acute on chronic respiratory failure ([place]) 10/06/2018  . Acute on chronic renal failure ([place]) 10/04/2018  . AKI (acute kidney injury) ([place])   . Acute on chronic congestive heart failure ([place])   . Guaiac + stool   . Benign neoplasm of colon   . Pulmonary hypertension, unspecified ([place]) 05/30/2018  . Chronic diastolic heart failure ([place]) 05/30/2018  .  Acute renal failure ([place])   . PAF (paroxysmal atrial fibrillation) ([place])   . Pressure injury of skin 01/17/2018  . Intestinal metaplasia of gastric mucosa   . Iron deficiency anemia   . Anasarca   . Malnutrition of moderate degree 01/10/2018  . GI bleed 01/09/2018  . Acute respiratory failure with hypoxia ([place]) 11/12/2017  . Gastritis and gastroduodenitis   . Acute gastric ulcer without hemorrhage or perforation   . Duodenal ulcer   . Paraesophageal hernia   . Upper GI bleeding 11/09/2017  . CKD (chronic kidney disease), stage III 11/09/2017  . Normocytic anemia 11/09/2017  . Melena   . RBBB 07/12/2017  . Pulmonary nodule 02/17/2017  . 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 Malignant neoplasm of left lung ([place]) 02/08/2017  . Elevated PSA 11/29/2016  . Aortic valve stenosis, moderate 12/26/2015  . BPH (benign prostatic hyperplasia) 12/18/2015  . Hypertension 12/18/2015  . 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 Heart murmur 11/04/2015  . History of active rheumatic fever 11/04/2015  . Solitary pulmonary nodule 08/31/2013  . 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 Thyroid nodule 08/31/2013  . Hyperlipidemia 04/25/2013  . Essential hypertension, benign 06/08/2012   Home Medication(s) Prior to Admission medications   Medication Sig Start Date End Date Taking? Authorizing Provider  amLODipine (NORVASC) 5 MG tablet Take 1 tablet (5 mg total) by mouth daily. 10/13/18  Yes Shelly Coss, MD  Brinzolamide-Brimonidine [facility]) 1-0.2 % SUSP Place 2-3 drops into both eyes 3 (three) times daily.    Yes [provider]  carvedilol (COREG) 3.125 MG tablet Take 1 tablet (3.125 mg total) by mouth 2 (two) times daily with a meal. 10/13/18  Yes Adhikari, Tamsen Meek, MD  ferric citrate (AURYXIA) 1 GM 210 MG(Fe) tablet Take 420 mg by mouth 3 (three) times daily with meals.   Yes [provider]  hydrALAZINE (APRESOLINE) 25 MG  tablet Take 1 tablet (25 mg total) by mouth every 8 (eight) hours. 10/13/18  Yes Shelly Coss, MD  Lifitegrast [facility]) 5 % SOLN Place 1 drop into both eyes 2 (two) times daily as needed (dry eyes).   Yes [provider]  multivitamin (RENA-VIT) TABS tablet Take 1 tablet by mouth daily.   Yes [provider]  Netarsudil-Latanoprost (ROCKLATAN) 0.02-0.005 % SOLN Place 1 drop into both eyes at bedtime.    Yes Marylynn Pearson, MD  Nutritional Supplements (FEEDING SUPPLEMENT, NEPRO CARB STEADY,) LIQD Take 237 mLs by mouth 2 (two) times daily.   Yes [provider]  pantoprazole (PROTONIX) 40 MG tablet TAKE 1 TABLET BY MOUTH TWICE A DAY Patient taking differently: Take 40 mg by mouth 2 (two) times daily.  09/24/18  Yes [name], MD                                                                                                                                    Past Surgical History Past Surgical History:  Procedure Laterality Date  . BIOPSY  11/11/2017   Procedure: BIOPSY;  Surgeon: Lavena Bullion, DO;  Location: [facility] ENDOSCOPY;  Service: Gastroenterology;;  . BIOPSY  01/10/2018   Procedure: BIOPSY;  Surgeon: [name], MD;  Location: [facility];  Service: Gastroenterology;;  . BIOPSY  06/22/2018   Procedure: BIOPSY;  Surgeon: Yetta Flock, MD;  Location: [facility] ENDOSCOPY;  Service: Gastroenterology;;  . cataract eye surgery Right   . COLONOSCOPY  11/2009   hx polyps/[place]  . COLONOSCOPY N/A 06/22/2018   Procedure: COLONOSCOPY;  Surgeon: Yetta Flock, MD;  Location: [facility];  Service: Gastroenterology;  Laterality: N/A;  . ENTEROSCOPY N/A 06/22/2018   Procedure: PUSH ENTEROSCOPY;  Surgeon: Yetta Flock, MD;  Location: [facility] ENDOSCOPY;  Service: Gastroenterology;  Laterality: N/A;  . ENTEROSCOPY N/A 11/12/2018   Procedure: ENTEROSCOPY;  Surgeon: Milus Banister, MD;  Location[facility] ENDOSCOPY;  Service: Endoscopy;  Laterality: N/A;  .  ESOPHAGOGASTRODUODENOSCOPY N/A 11/11/2017   Procedure: ESOPHAGOGASTRODUODENOSCOPY (EGD);  Surgeon: Lavena Bullion, DO;  Location: [facility] ENDOSCOPY;  Service: Gastroenterology;  Laterality: N/A;  . ESOPHAGOGASTRODUODENOSCOPY (EGD) WITH PROPOFOL N/A 01/10/2018   Procedure: ESOPHAGOGASTRODUODENOSCOPY (EGD) WITH PROPOFOL;  Surgeon: [name], MD;  Location: [facility];  Service: Gastroenterology;  Laterality: N/A;  . GIVENS CAPSULE STUDY N/A 11/12/2018   Procedure: GIVENS CAPSULE STUDY;  Surgeon: Milus Banister, MD;  Location: [facility] ENDOSCOPY;  Service: Endoscopy;  Laterality: N/A;  . HEMOSTASIS CLIP PLACEMENT  11/12/2018   Procedure: HEMOSTASIS CLIP PLACEMENT;  Surgeon: Milus Banister, MD;  Location: [facility];  Service: Endoscopy;;  . HOT HEMOSTASIS N/A 06/22/2018   Procedure: HOT HEMOSTASIS (ARGON PLASMA COAGULATION/BICAP);  Surgeon: Yetta Flock, MD;  Location: [facility] ENDOSCOPY;  Service: Gastroenterology;  Laterality: N/A;  . HOT HEMOSTASIS N/A 11/12/2018   Procedure: HOT HEMOSTASIS (ARGON PLASMA COAGULATION/BICAP);  Surgeon: Milus Banister, MD;  Location[facility] ENDOSCOPY;  Service: Endoscopy;  Laterality: N/A;  . IR FLUORO GUIDE CV LINE RIGHT  01/16/2018  . IR FLUORO GUIDE CV LINE RIGHT  01/31/2018  . IR FLUORO GUIDE CV LINE RIGHT  10/06/2018  . IR FLUORO GUIDE CV LINE RIGHT  10/11/2018  . IR US GUIDE VASC ACCESS RIGHT  01/16/2018  . IR US GUIDE VASC ACCESS RIGHT  01/31/2018  . IR US GUIDE VASC ACCESS RIGHT  10/06/2018  . POLYPECTOMY  06/22/2018   Procedure: POLYPECTOMY;  Surgeon: Yetta Flock, MD;  Location: [facility];  Service: Gastroenterology;;  . PROSTATE BIOPSY    . right eye surgery     to lower eye pressure  . RIGHT HEART CATH N/A 01/19/2018   Procedure: RIGHT HEART CATH;  Surgeon: Nelva Bush, MD;  Location: [facility] CV LAB;  Service: Cardiovascular;  Laterality: N/A;  . TONSILLECTOMY    . VIDEO ASSISTED THORACOSCOPY (VATS)/ LOBECTOMY Right 12/26/2015   . wisdom tteeth ext     Family History Family History  Problem Relation Age of Onset  . Diabetes Mother   . Cerebral aneurysm Mother   . Lung cancer Father   . Diabetes Sister   . Colon cancer Neg Hx   . Colon polyps Neg Hx   . Esophageal cancer Neg Hx   . Rectal cancer Neg Hx   . Stomach cancer Neg Hx     Social History Social History   Tobacco Use  . Smoking status: Former Smoker    Packs/day: 1.00    Types: Cigarettes    Quit date: 01/19/2011    Years since quitting: 7.8  . Smokeless tobacco: Never Used  Substance Use Topics  . Alcohol use: Yes    Alcohol/week: 1.0 standard drinks    Types: 1 Shots of liquor per week    Comment: rarely  . Drug use: No   Allergies Ambien [zolpidem tartrate] and Lipitor [atorvastatin]  Review of Systems Review of Systems All other systems are reviewed and are negative for acute change except as noted in the HPI  Physical Exam Vital Signs  I have reviewed the triage vital signs BP (!) 154/74 (BP Location: Right Arm)   Pulse 86   Temp 99 F (37.2 C)   Resp (!) 26   Ht 5\' 10"  (1.778 m)   Wt 83.9 kg   SpO2 97%   BMI 26.54 kg/m   Physical Exam Vitals signs reviewed.  Constitutional:      General: He is not in acute distress.    Appearance: He is well-developed. He is not diaphoretic.  HENT:     Head: Normocephalic and atraumatic.     Nose: Nose normal.  Eyes:     General: No scleral icterus.       Right eye: No discharge.        Left eye: No discharge.     Conjunctiva/sclera: Conjunctivae normal.     Pupils: Pupils are equal, round, and reactive to light.  Neck:     Musculoskeletal: Normal range of motion and neck supple.  Cardiovascular:     Rate and Rhythm: Normal rate and regular rhythm.     Heart sounds: Murmur present. Systolic murmur present. No friction rub. No gallop.   Pulmonary:     Effort: Pulmonary effort is normal. No respiratory distress.     Breath sounds: Normal breath sounds. No stridor. No rales.   Abdominal:     General: There is no distension.     Palpations: Abdomen is soft.     Tenderness: There is no abdominal tenderness.  Musculoskeletal:        General: No tenderness.  Skin:    General: Skin is warm and dry.     Findings: No erythema or rash.  Neurological:     Mental Status: He is alert and oriented to person, place, and time.     ED Results and Treatments Labs (all labs ordered are listed, but only abnormal results are displayed) Labs Reviewed  COMPREHENSIVE METABOLIC PANEL - Abnormal; Notable for the following components:      Result Value   Potassium 2.8 (*)    Chloride 96 (*)    Creatinine, Ser 2.24 (*)    Calcium 7.1 (*)    Total Protein 5.1 (*)    Albumin 2.2 (*)    GFR calc non Af Amer 27 (*)    GFR calc Af Amer 31 (*)    All other components within normal limits  CBC WITH DIFFERENTIAL/PLATELET - Abnormal; Notable for the following components:   RBC 2.75 (*)    Hemoglobin 7.4 (*)    HCT 24.2 (*)    RDW 19.9 (*)    Platelets 147 (*)    Abs Immature Granulocytes 0.09 (*)    All other components within normal limits  CBC - Abnormal; Notable for the following components:   RBC 2.83 (*)    Hemoglobin 7.6 (*)    HCT 24.8 (*)    RDW 19.5 (*)    All other components within normal limits  LACTIC ACID, PLASMA  LACTIC ACID, PLASMA  URINALYSIS, ROUTINE W REFLEX MICROSCOPIC  TYPE AND SCREEN                                                                                                                         EKG  EKG Interpretation  Date/Time:  Friday November 17 2018 18:11:49 EDT Ventricular Rate:  91 PR Interval:  200 QRS Duration: 136 QT Interval:  400 QTC Calculation: 492 R Axis:   -2 Text Interpretation: duplicate, discard Confirmed by Delora Fuel ([id number]) on 11/18/2018 12:19:51 AM      Radiology No results found.  Pertinent labs & imaging results that were available during my care of the patient were reviewed by me and considered in my  medical decision making (see chart for details).  Medications Ordered in ED Medications  sodium chloride flush (NS) 0.9 % injection 3 mL (has no administration  in time range)                                                                                                                                    Procedures Procedures  (including critical care time)  Medical Decision Making / ED Course I have reviewed the nursing notes for this encounter and the patient's prior records (if available in EHR or on provided paperwork).   Andrew Guerra was evaluated in Emergency Department on 11/18/2018 for the symptoms described in the history of present illness. He was evaluated in the context of the global COVID-19 pandemic, which necessitated consideration that the patient might be at risk for infection with the SARS-CoV-2 virus that causes COVID-19. Institutional protocols and algorithms that pertain to the evaluation of patients at risk for COVID-19 are in a state of rapid change based on information released by regulatory bodies including the CDC and federal and state organizations. These policies and algorithms were followed during the patient's care in the ED.  Asymptomatic anemia. Hb here 7.4. Repeated to ensure accuracy - Hb 7.6.  No need for emergent transfusion.   The patient appears reasonably screened and/or stabilized for discharge and I doubt any other medical condition or other [facility] requiring further screening, evaluation, or treatment in the ED at this time prior to discharge.  The patient is safe for discharge with strict return precautions.       Final Clinical Impression(s) / ED Diagnoses Final diagnoses:  Iron deficiency anemia due to chronic blood loss    The patient appears reasonably screened and/or stabilized for discharge and I doubt any other medical condition or other [facility] requiring further screening, evaluation, or treatment in the ED at this time prior to  discharge.  Disposition: Discharge  Condition: Good  I have discussed the results, Dx and Tx plan with the patient who expressed understanding and agree(s) with the plan. Discharge instructions discussed at great length. The patient was given strict return precautions who verbalized understanding of the instructions. No further questions at time of discharge.    ED Discharge Orders    None      Follow Up: [name], MD [place] [address] [phone]  Schedule an appointment as soon as possible for a visit        This chart was dictated using voice recognition software.  Despite best efforts to proofread,  errors can occur which can change the documentation meaning.   Fatima Blank, MD 11/18/18 [phone]

## 2018-11-18 NOTE — ED Notes (Signed)
Patient verbalized understanding of discharge instructions and denies any further needs or questions at this time. VS stable. Patient ambulatory with steady gait. Assisted to car via wheelchair with O2 tank (patient brought portable tank for ride home).

## 2018-11-18 NOTE — ED Notes (Signed)
Patient states he makes little urine a day and may not be able to provide urine specimen

## 2018-11-21 ENCOUNTER — Encounter (HOSPITAL_COMMUNITY): Payer: Self-pay | Admitting: Emergency Medicine

## 2018-11-21 ENCOUNTER — Emergency Department (HOSPITAL_COMMUNITY): Payer: Medicare Other

## 2018-11-21 ENCOUNTER — Emergency Department (HOSPITAL_COMMUNITY)
Admission: EM | Admit: 2018-11-21 | Discharge: 2018-11-21 | Disposition: A | Discharge status: Home / Self Care | Payer: Medicare Other | Attending: Emergency Medicine | Admitting: Emergency Medicine

## 2018-11-21 ENCOUNTER — Ambulatory Visit (HOSPITAL_COMMUNITY): Admit: 2018-11-21 | Payer: Medicare Other | Admitting: Gastroenterology

## 2018-11-21 ENCOUNTER — Other Ambulatory Visit (INDEPENDENT_AMBULATORY_CARE_PROVIDER_SITE_OTHER): Payer: Medicare Other

## 2018-11-21 ENCOUNTER — Encounter (HOSPITAL_COMMUNITY): Payer: Self-pay

## 2018-11-21 ENCOUNTER — Telehealth: Payer: Self-pay | Admitting: Emergency Medicine

## 2018-11-21 ENCOUNTER — Other Ambulatory Visit: Payer: Self-pay

## 2018-11-21 DIAGNOSIS — D649 Anemia, unspecified: Secondary | ICD-10-CM

## 2018-11-21 DIAGNOSIS — Z79899 Other long term (current) drug therapy: Secondary | ICD-10-CM | POA: Diagnosis not present

## 2018-11-21 DIAGNOSIS — Z87891 Personal history of nicotine dependence: Secondary | ICD-10-CM | POA: Insufficient documentation

## 2018-11-21 DIAGNOSIS — I130 Hypertensive heart and chronic kidney disease with heart failure and stage 1 through stage 4 chronic kidney disease, or unspecified chronic kidney disease: Secondary | ICD-10-CM | POA: Insufficient documentation

## 2018-11-21 DIAGNOSIS — N183 Chronic kidney disease, stage 3 unspecified: Secondary | ICD-10-CM | POA: Insufficient documentation

## 2018-11-21 DIAGNOSIS — D631 Anemia in chronic kidney disease: Secondary | ICD-10-CM | POA: Diagnosis present

## 2018-11-21 DIAGNOSIS — I5032 Chronic diastolic (congestive) heart failure: Secondary | ICD-10-CM | POA: Diagnosis not present

## 2018-11-21 LAB — CBC WITH DIFFERENTIAL/PLATELET
Abs Immature Granulocytes: 0.51 10*3/uL — ABNORMAL HIGH (ref 0.00–0.07)
Basophils Absolute: 0 10*3/uL (ref 0.0–0.1)
Basophils Relative: 0 %
Eosinophils Absolute: 0.1 10*3/uL (ref 0.0–0.5)
Eosinophils Relative: 0 %
HCT: 24.7 % — ABNORMAL LOW (ref 39.0–52.0)
Hemoglobin: 7.4 g/dL — ABNORMAL LOW (ref 13.0–17.0)
Immature Granulocytes: 4 %
Lymphocytes Relative: 11 %
Lymphs Abs: 1.4 10*3/uL (ref 0.7–4.0)
MCH: 26.1 pg (ref 26.0–34.0)
MCHC: 30 g/dL (ref 30.0–36.0)
MCV: 87.3 fL (ref 80.0–100.0)
Monocytes Absolute: 0.8 10*3/uL (ref 0.1–1.0)
Monocytes Relative: 6 %
Neutro Abs: 10.3 10*3/uL — ABNORMAL HIGH (ref 1.7–7.7)
Neutrophils Relative %: 79 %
Platelets: 284 10*3/uL (ref 150–400)
RBC: 2.83 MIL/uL — ABNORMAL LOW (ref 4.22–5.81)
RDW: 19.6 % — ABNORMAL HIGH (ref 11.5–15.5)
WBC: 13.1 10*3/uL — ABNORMAL HIGH (ref 4.0–10.5)
nRBC: 0.5 % — ABNORMAL HIGH (ref 0.0–0.2)

## 2018-11-21 LAB — CBC
HCT: 21.6 % — CL (ref 39.0–52.0)
Hemoglobin: 6.7 g/dL — CL (ref 13.0–17.0)
MCHC: 31.3 g/dL (ref 30.0–36.0)
MCV: 82.4 fl (ref 78.0–100.0)
Platelets: 273 10*3/uL (ref 150.0–400.0)
RBC: 2.62 Mil/uL — ABNORMAL LOW (ref 4.22–5.81)
RDW: 20 % — ABNORMAL HIGH (ref 11.5–15.5)
WBC: 11.7 10*3/uL — ABNORMAL HIGH (ref 4.0–10.5)

## 2018-11-21 LAB — COMPREHENSIVE METABOLIC PANEL
ALT: 18 U/L (ref 0–44)
AST: 22 U/L (ref 15–41)
Albumin: 2.5 g/dL — ABNORMAL LOW (ref 3.5–5.0)
Alkaline Phosphatase: 85 U/L (ref 38–126)
Anion gap: 12 (ref 5–15)
BUN: 40 mg/dL — ABNORMAL HIGH (ref 8–23)
CO2: 30 mmol/L (ref 22–32)
Calcium: 7.5 mg/dL — ABNORMAL LOW (ref 8.9–10.3)
Chloride: 97 mmol/L — ABNORMAL LOW (ref 98–111)
Creatinine, Ser: 4.15 mg/dL — ABNORMAL HIGH (ref 0.61–1.24)
GFR calc Af Amer: 15 mL/min — ABNORMAL LOW (ref >60)
GFR calc non Af Amer: 13 mL/min — ABNORMAL LOW (ref >60)
Glucose, Bld: 91 mg/dL (ref 70–99)
Potassium: 3.4 mmol/L — ABNORMAL LOW (ref 3.5–5.1)
Sodium: 139 mmol/L (ref 135–145)
Total Bilirubin: 0.6 mg/dL (ref 0.3–1.2)
Total Protein: 5.5 g/dL — ABNORMAL LOW (ref 6.5–8.1)

## 2018-11-21 LAB — PROTIME-INR
INR: 1 (ref 0.8–1.2)
Prothrombin Time: 13.4 seconds (ref 11.4–15.2)

## 2018-11-21 LAB — PREPARE RBC (CROSSMATCH)

## 2018-11-21 SURGERY — ENTEROSCOPY
Anesthesia: Monitor Anesthesia Care

## 2018-11-21 MED ORDER — SODIUM CHLORIDE 0.9 % IV SOLN: 10.0000 mL/h | Freq: Once | INTRAVENOUS | Status: DC

## 2018-11-21 NOTE — ED Notes (Signed)
PT request to sit on side of bed  °

## 2018-11-21 NOTE — Telephone Encounter (Signed)
Received critical low from lab with hgb of 6.7. Per Dr Tarri Glenn he will need to be admitted and transfused. Patient was very unhappy stating he was just in the hospital and his hgb was 7 and they told him to wait another week. He states he was there all night only to be sent home and he doesn't want to go through that again. I tried to reassure him and urged him to go to the ER. He states his wife is not home and he will have to wait for her to get back before he can go to the ER. Dr. Tarri Glenn aware.

## 2018-11-21 NOTE — ED Provider Notes (Addendum)
[place] DEPT Provider Note   CSN: [id number] Arrival date & time: 11/21/18  1345     History   Chief Complaint Chief Complaint  Patient presents with  . Abnormal Lab    hgb 6.7    HPI Andrew Guerra is a 78 y.o. male.     Patient has a history of anemia and small bowel hemorrhage.  Dr. Tarri Glenn gastroenterologist is care for him and decided to send him to the ED for blood transfusion because his hemoglobin was 6.7.  The history is provided by the patient. No language interpreter was used.  Weakness Severity:  Moderate Onset quality:  Gradual Timing:  Constant Progression:  Waxing and waning Chronicity:  Recurrent Context: not alcohol use   Relieved by:  Nothing Worsened by:  Nothing Ineffective treatments:  None tried Associated symptoms: no abdominal pain, no chest pain, no cough, no diarrhea, no frequency, no headaches and no seizures     Past Medical History:  Diagnosis Date  . 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  [name] Cataract    right eye - surgery to remove  . Elevated PSA 11/29/2016   Patient is followed by [facility].  Most recent PSA near 11.  He has had atypia on biopsy.  November 2008  . Essential hypertension, benign 06/08/2012  . Full dentures   . GI bleed   . Glaucoma   . Heart murmur    since childhood, never has caused any problems  . History of rheumatic fever 11/04/2015  . Hyperlipidemia 04/25/2013  . Hypertension   . Malignant neoplasm of left lung ([place]) 02/08/2017  . RBBB 07/12/2017  . Sickle cell trait ([place])    no problems per patient    Patient Active Problem List   Diagnosis Date Noted  . Acute GI bleeding 11/10/2018  . Acute blood loss anemia 11/10/2018  . AVM (arteriovenous malformation) of small bowel, acquired 11/09/2018  . Acute on chronic respiratory failure ([place])  10/06/2018  . Acute on chronic renal failure ([place]) 10/04/2018  . AKI (acute kidney injury) ([place])   . Acute on chronic congestive heart failure ([place])   . Guaiac + stool   . Benign neoplasm of colon   . Pulmonary hypertension, unspecified ([place]) 05/30/2018  . Chronic diastolic heart failure ([place]) 05/30/2018  . Acute renal failure ([place])   . PAF (paroxysmal atrial fibrillation) ([place])   . Pressure injury of skin 01/17/2018  . Intestinal metaplasia of gastric mucosa   . Iron deficiency anemia   . Anasarca   . Malnutrition of moderate degree 01/10/2018  . GI bleed 01/09/2018  . Acute respiratory failure with hypoxia ([place]) 11/12/2017  . Gastritis and gastroduodenitis   . Acute gastric ulcer without hemorrhage or perforation   . Duodenal ulcer   . Paraesophageal hernia   . Upper GI bleeding 11/09/2017  . CKD (chronic kidney disease), stage III 11/09/2017  . Normocytic anemia 11/09/2017  . Melena   . RBBB 07/12/2017  . Pulmonary nodule 02/17/2017  . 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 Malignant neoplasm of left lung ([place]) 02/08/2017  . Elevated PSA 11/29/2016  . Aortic valve stenosis, moderate 12/26/2015  . BPH (benign prostatic hyperplasia) 12/18/2015  . Hypertension 12/18/2015  . 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 Heart murmur 11/04/2015  . History of  active rheumatic fever 11/04/2015  . Solitary pulmonary nodule 08/31/2013  . 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 Thyroid nodule 08/31/2013  . Hyperlipidemia 04/25/2013  . Essential hypertension, benign 06/08/2012    Past Surgical History:  Procedure Laterality Date  . BIOPSY  11/11/2017   Procedure: BIOPSY;  Surgeon: Lavena Bullion, DO;  Location: [place] ENDOSCOPY;  Service: Gastroenterology;;  . BIOPSY  01/10/2018   Procedure: BIOPSY;  Surgeon: [name], MD;  Location: [place];  Service: Gastroenterology;;  . BIOPSY  06/22/2018   Procedure: BIOPSY;   Surgeon: Yetta Flock, MD;  Location: [facility] ENDOSCOPY;  Service: Gastroenterology;;  . cataract eye surgery Right   . COLONOSCOPY  11/2009   hx polyps/[place]  . COLONOSCOPY N/A 06/22/2018   Procedure: COLONOSCOPY;  Surgeon: Yetta Flock, MD;  Location: [place];  Service: Gastroenterology;  Laterality: N/A;  . ENTEROSCOPY N/A 06/22/2018   Procedure: PUSH ENTEROSCOPY;  Surgeon: Yetta Flock, MD;  Location: [facility] ENDOSCOPY;  Service: Gastroenterology;  Laterality: N/A;  . ENTEROSCOPY N/A 11/12/2018   Procedure: ENTEROSCOPY;  Surgeon: Milus Banister, MD;  Location: [facility] ENDOSCOPY;  Service: Endoscopy;  Laterality: N/A;  . ESOPHAGOGASTRODUODENOSCOPY N/A 11/11/2017   Procedure: ESOPHAGOGASTRODUODENOSCOPY (EGD);  Surgeon: Lavena Bullion, DO;  Location: [facility] ENDOSCOPY;  Service: Gastroenterology;  Laterality: N/A;  . ESOPHAGOGASTRODUODENOSCOPY (EGD) WITH PROPOFOL N/A 01/10/2018   Procedure: ESOPHAGOGASTRODUODENOSCOPY (EGD) WITH PROPOFOL;  Surgeon: [name], MD;  Location: [place];  Service: Gastroenterology;  Laterality: N/A;  . GIVENS CAPSULE STUDY N/A 11/12/2018   Procedure: GIVENS CAPSULE STUDY;  Surgeon: Milus Banister, MD;  Location: [facility] ENDOSCOPY;  Service: Endoscopy;  Laterality: N/A;  . HEMOSTASIS CLIP PLACEMENT  11/12/2018   Procedure: HEMOSTASIS CLIP PLACEMENT;  Surgeon: Milus Banister, MD;  Location: [place];  Service: Endoscopy;;  . HOT HEMOSTASIS N/A 06/22/2018   Procedure: HOT HEMOSTASIS (ARGON PLASMA COAGULATION/BICAP);  Surgeon: Yetta Flock, MD;  Location: [facility] ENDOSCOPY;  Service: Gastroenterology;  Laterality: N/A;  . HOT HEMOSTASIS N/A 11/12/2018   Procedure: HOT HEMOSTASIS (ARGON PLASMA COAGULATION/BICAP);  Surgeon: Milus Banister, MD;  Location: [facility] ENDOSCOPY;  Service: Endoscopy;  Laterality: N/A;  . IR FLUORO GUIDE CV LINE RIGHT  01/16/2018  . IR FLUORO GUIDE CV LINE RIGHT  01/31/2018  . IR FLUORO GUIDE CV LINE RIGHT  10/06/2018  .  IR FLUORO GUIDE CV LINE RIGHT  10/11/2018  . IR US GUIDE VASC ACCESS RIGHT  01/16/2018  . IR US GUIDE VASC ACCESS RIGHT  01/31/2018  . IR US GUIDE VASC ACCESS RIGHT  10/06/2018  . POLYPECTOMY  06/22/2018   Procedure: POLYPECTOMY;  Surgeon: Yetta Flock, MD;  Location: [place];  Service: Gastroenterology;;  . PROSTATE BIOPSY    . right eye surgery     to lower eye pressure  . RIGHT HEART CATH N/A 01/19/2018   Procedure: RIGHT HEART CATH;  Surgeon: Nelva Bush, MD;  Location: [place] CV LAB;  Service: Cardiovascular;  Laterality: N/A;  . TONSILLECTOMY    . VIDEO ASSISTED THORACOSCOPY (VATS)/ LOBECTOMY Right 12/26/2015  . wisdom tteeth ext          Home Medications    Prior to Admission medications   Medication Sig Start Date End Date Taking? Authorizing Provider  amLODipine (NORVASC) 5 MG tablet Take 1 tablet (5 mg total) by mouth daily. 10/13/18  Yes Shelly Coss, MD  Brinzolamide-Brimonidine [facility]) 1-0.2 % SUSP Place 2-3 drops into both eyes 3 (three) times daily.  Yes [provider]  carvedilol (COREG) 3.125 MG tablet Take 1 tablet (3.125 mg total) by mouth 2 (two) times daily with a meal. 10/13/18  Yes Adhikari, Tamsen Meek, MD  ferric citrate (AURYXIA) 1 GM 210 MG(Fe) tablet Take 210 mg by mouth 3 (three) times daily with meals.    Yes [provider]  hydrALAZINE (APRESOLINE) 25 MG tablet Take 1 tablet (25 mg total) by mouth every 8 (eight) hours. 10/13/18  Yes Shelly Coss, MD  Lifitegrast [facility]) 5 % SOLN Place 1 drop into both eyes 2 (two) times daily as needed (dry eyes).   Yes [provider]  multivitamin (RENA-VIT) TABS tablet Take 1 tablet by mouth daily.   Yes [provider]  Netarsudil-Latanoprost (ROCKLATAN) 0.02-0.005 % SOLN Place 1 drop into both eyes at bedtime.    Yes Marylynn Pearson, MD  Nutritional Supplements (FEEDING SUPPLEMENT, NEPRO CARB STEADY,) LIQD Take 237 mLs by mouth 2 (two) times daily.   Yes  [provider]  pantoprazole (PROTONIX) 40 MG tablet TAKE 1 TABLET BY MOUTH TWICE A DAY Patient taking differently: Take 40 mg by mouth 2 (two) times daily.  09/24/18  Yes [name], MD    Family History Family History  Problem Relation Age of Onset  . Diabetes Mother   . Cerebral aneurysm Mother   . Lung cancer Father   . Diabetes Sister   . Colon cancer Neg Hx   . Colon polyps Neg Hx   . Esophageal cancer Neg Hx   . Rectal cancer Neg Hx   . Stomach cancer Neg Hx     Social History Social History   Tobacco Use  . Smoking status: Former Smoker    Packs/day: 1.00    Types: Cigarettes    Quit date: 01/19/2011    Years since quitting: 7.8  . Smokeless tobacco: Never Used  Substance Use Topics  . Alcohol use: Yes    Alcohol/week: 1.0 standard drinks    Types: 1 Shots of liquor per week    Comment: rarely  . Drug use: No     Allergies   Ambien [zolpidem tartrate] and Lipitor [atorvastatin]   Review of Systems Review of Systems  Constitutional: Negative for appetite change and fatigue.  HENT: Negative for congestion, ear discharge and sinus pressure.   Eyes: Negative for discharge.  Respiratory: Negative for cough.   Cardiovascular: Negative for chest pain.  Gastrointestinal: Negative for abdominal pain and diarrhea.  Genitourinary: Negative for frequency and hematuria.  Musculoskeletal: Negative for back pain.  Skin: Negative for rash.  Neurological: Positive for weakness. Negative for seizures and headaches.  Psychiatric/Behavioral: Negative for hallucinations.     Physical Exam Updated Vital Signs BP (!) 156/82 (BP Location: Left Arm)   Pulse 86   Temp 98.3 F (36.8 C) (Oral)   Resp 18   SpO2 98%   Physical Exam Vitals signs reviewed.  Constitutional:      Appearance: He is well-developed.  HENT:     Head: Normocephalic.     Nose: Nose normal.  Eyes:     General: No scleral icterus.    Conjunctiva/sclera: Conjunctivae normal.   Neck:     Musculoskeletal: Neck supple.     Thyroid: No thyromegaly.  Cardiovascular:     Rate and Rhythm: Normal rate and regular rhythm.     Heart sounds: No murmur. No friction rub. No gallop.   Pulmonary:     Breath sounds: No stridor. No wheezing or rales.  Chest:  Chest wall: No tenderness.  Abdominal:     General: There is no distension.     Tenderness: There is no abdominal tenderness. There is no rebound.  Musculoskeletal: Normal range of motion.  Lymphadenopathy:     Cervical: No cervical adenopathy.  Skin:    Findings: No erythema or rash.  Neurological:     Mental Status: He is alert and oriented to person, place, and time.     Motor: No abnormal muscle tone.     Coordination: Coordination normal.  Psychiatric:        Behavior: Behavior normal.      ED Treatments / Results  Labs (all labs ordered are listed, but only abnormal results are displayed) Labs Reviewed  CBC WITH DIFFERENTIAL/PLATELET - Abnormal; Notable for the following components:      Result Value   WBC 13.1 (*)    RBC 2.83 (*)    Hemoglobin 7.4 (*)    HCT 24.7 (*)    RDW 19.6 (*)    nRBC 0.5 (*)    Neutro Abs 10.3 (*)    Abs Immature Granulocytes 0.51 (*)    All other components within normal limits  COMPREHENSIVE METABOLIC PANEL - Abnormal; Notable for the following components:   Potassium 3.4 (*)    Chloride 97 (*)    BUN 40 (*)    Creatinine, Ser 4.15 (*)    Calcium 7.5 (*)    Total Protein 5.5 (*)    Albumin 2.5 (*)    GFR calc non Af Amer 13 (*)    GFR calc Af Amer 15 (*)    All other components within normal limits  PROTIME-INR  POC OCCULT BLOOD, ED  TYPE AND SCREEN  ABO/RH  PREPARE RBC (CROSSMATCH)    EKG None  Radiology Dg Chest Portable 1 View  Result Date: 11/21/2018 CLINICAL DATA:  Shortness of breath, anemia, hypertension, lung cancer EXAM: PORTABLE CHEST 1 VIEW COMPARISON:  Portable exam 1549 hours compared to 11/10/2018 FINDINGS: RIGHT jugular dual-lumen  central venous catheter with tip projecting over cavoatrial junction. Normal heart size mediastinal contours. Atherosclerotic calcification aorta. New LEFT basilar infiltrate. Chronic RIGHT basilar and infrahilar opacity. Small bibasilar pleural effusions. No pneumothorax. IMPRESSION: Small bibasilar effusions with chronic RIGHT basilar changes and new LEFT lower lobe infiltrate. Electronically Signed   By: Lavonia Dana M.D.   On: 11/21/2018 16:00    Procedures Procedures (including critical care time)  Medications Ordered in ED Medications  0.9 %  sodium chloride infusion (has no administration in time range)     Initial Impression / Assessment and Plan / ED Course  I have reviewed the triage vital signs and the nursing notes.  Pertinent labs & imaging results that were available during my care of the patient were reviewed by me and considered in my medical decision making (see chart for details). CRITICAL CARE Performed by: Milton Ferguson Total critical care time:90minutes Critical care time was exclusive of separately billable procedures and treating other patients. Critical care was necessary to treat or prevent imminent or life-threatening deterioration. Critical care was time spent personally by me on the following activities: development of treatment plan with patient and/or surrogate as well as nursing, discussions with consultants, evaluation of patient's response to treatment, examination of patient, obtaining history from patient or surrogate, ordering and performing treatments and interventions, ordering and review of laboratory studies, ordering and review of radiographic studies, pulse oximetry and re-evaluation of patient's condition.      Patient's hemoglobin  here was 7.4.  I called the gastroenterologist and they decided that the patient should get 1 unit of blood and be discharged home.  Final Clinical Impressions(s) / ED Diagnoses   Final diagnoses:  None    ED  Discharge Orders    None       Milton Ferguson, MD 11/21/18 2148    Milton Ferguson, MD 12/03/18 1158

## 2018-11-21 NOTE — ED Notes (Signed)
Provided patient and patients spouse a warm blanket.

## 2018-11-21 NOTE — Discharge Instructions (Signed)
Follow up with your gastroenterologist as planned.

## 2018-11-21 NOTE — ED Triage Notes (Signed)
Pt was called today and told need to go to ED for low Hgb 6.7. pt has hx having blood transfusions. On Continuous 2L O2

## 2018-11-22 LAB — TYPE AND SCREEN
ABO/RH(D): B POS
Antibody Screen: NEGATIVE
Unit division: 0

## 2018-11-22 LAB — BPAM RBC
Blood Product Expiration Date: 202011292359
ISSUE DATE / TIME: 202011032016
Unit Type and Rh: 7300

## 2018-11-22 LAB — ABO/RH: ABO/RH(D): B POS

## 2018-11-23 ENCOUNTER — Ambulatory Visit: Payer: Medicare Other | Admitting: Gastroenterology

## 2018-11-23 ENCOUNTER — Other Ambulatory Visit (INDEPENDENT_AMBULATORY_CARE_PROVIDER_SITE_OTHER): Payer: Medicare Other

## 2018-11-23 ENCOUNTER — Encounter: Payer: Self-pay | Admitting: Gastroenterology

## 2018-11-23 VITALS — BP 130/70 | HR 87 | Temp 98.6°F | Ht 70.0 in | Wt 183.0 lb

## 2018-11-23 DIAGNOSIS — K922 Gastrointestinal hemorrhage, unspecified: Secondary | ICD-10-CM | POA: Diagnosis not present

## 2018-11-23 DIAGNOSIS — D62 Acute posthemorrhagic anemia: Secondary | ICD-10-CM

## 2018-11-23 DIAGNOSIS — K5520 Angiodysplasia of colon without hemorrhage: Secondary | ICD-10-CM

## 2018-11-23 LAB — CBC
HCT: 24.6 % — ABNORMAL LOW (ref 39.0–52.0)
Hemoglobin: 8.1 g/dL — ABNORMAL LOW (ref 13.0–17.0)
MCHC: 32.9 g/dL (ref 30.0–36.0)
MCV: 82.8 fl (ref 78.0–100.0)
Platelets: 314 10*3/uL (ref 150.0–400.0)
RBC: 2.97 Mil/uL — ABNORMAL LOW (ref 4.22–5.81)
RDW: 19.1 % — ABNORMAL HIGH (ref 11.5–15.5)
WBC: 9.8 10*3/uL (ref 4.0–10.5)

## 2018-11-23 NOTE — Patient Instructions (Signed)
If you are age 80 or older, your body mass index should be between 23-30. Your Body mass index is 26.26 kg/m. If this is out of the aforementioned range listed, please consider follow up with your Primary Care Provider.  If you are age 31 or younger, your body mass index should be between 19-25. Your Body mass index is 26.26 kg/m. If this is out of the aformentioned range listed, please consider follow up with your Primary Care Provider.   Your provider has requested that you go to the basement level for lab work before leaving today. Press "B" on the elevator. The lab is located at the first door on the left as you exit the elevator.  We will contact you regarding referral to [facility].  Thank you for choosing me and [facility].   [name], PA-C

## 2018-11-23 NOTE — Progress Notes (Signed)
11/23/2018 Andrew Guerra [id number] 02-16-1940   HISTORY OF PRESENT ILLNESS:  This is an 78 year old male who is a patient of Dr. Payton Emerald.  Has ESRD on HD, moderate AS due to bicuspid valve, COPD and history of lung cancer on O2.  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  Had 7 duodenal and proximal jejunal AVMs treated with APC on June 22, 2018.  Dropped his hemoglobin again in October so was sent to the hospital for transfusion and underwent small bowel enteroscopy with Dr. Ardis Hughs on October 25 that showed the following:  - Slowly oozing AVM just distal to the duodenal bulb, slightly umbilicated. This was treated with APC and then a single endoclip was applied. There was hematin downstream from this lesion but no other AVMs were noted (to approximately the mid jejunum). - Medium to large hiatal hernia without Cameron's erosions. - The examination was otherwise normal.  Capsule endoscopy was then also placed that day and showed 3 small nonbleeding AVMs throughout the small bowel.  Since his hospital discharge on October 25 at which time his hemoglobin was 8.2 g hemoglobin has continued to decrease down to 7.4 g and then he had a reading on November 3 of 6.7 g.  Once again he was sent to the emergency department and was transfused with 1 unit of packed red blood cells and then discharged home.  He is unhappy that he keeps getting sent to the emergency department for blood transfusions.  His stools remain black, but he also remains on ferric citrate 210 mg TID.   Past Medical History:  Diagnosis Date  . Anemia   . 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  [name] Cataract    right eye - surgery to remove  . Elevated PSA 11/29/2016   Patient is followed by [facility].  Most recent PSA near 11.  He has had atypia on biopsy.  November 2008  .  Essential hypertension, benign 06/08/2012  . Full dentures   . GI bleed   . Glaucoma   . Heart murmur    since childhood, never has caused any problems  . History of rheumatic fever 11/04/2015  . Hyperlipidemia 04/25/2013  . Hypertension   . Malignant neoplasm of left lung ([place]) 02/08/2017  . RBBB 07/12/2017  . Sickle cell trait ([place])    no problems per patient   Past Surgical History:  Procedure Laterality Date  . BIOPSY  11/11/2017   Procedure: BIOPSY;  Surgeon: Lavena Bullion, DO;  Location: [facility] ENDOSCOPY;  Service: Gastroenterology;;  . BIOPSY  01/10/2018   Procedure: BIOPSY;  Surgeon: [name], MD;  Location: [place];  Service: Gastroenterology;;  . BIOPSY  06/22/2018   Procedure: BIOPSY;  Surgeon: Yetta Flock, MD;  Location: [facility] ENDOSCOPY;  Service: Gastroenterology;;  . cataract eye surgery Right   . COLONOSCOPY  11/2009   hx polyps/[place]  . COLONOSCOPY N/A 06/22/2018   Procedure: COLONOSCOPY;  Surgeon: Yetta Flock, MD;  Location: [place];  Service: Gastroenterology;  Laterality: N/A;  . ENTEROSCOPY N/A 06/22/2018   Procedure: PUSH ENTEROSCOPY;  Surgeon: Yetta Flock, MD;  Location: [facility] ENDOSCOPY;  Service: Gastroenterology;  Laterality: N/A;  . ENTEROSCOPY N/A 11/12/2018   Procedure: ENTEROSCOPY;  Surgeon: Milus Banister, MD;  Location: [facility] ENDOSCOPY;  Service: Endoscopy;  Laterality: N/A;  .  ESOPHAGOGASTRODUODENOSCOPY N/A 11/11/2017   Procedure: ESOPHAGOGASTRODUODENOSCOPY (EGD);  Surgeon: Lavena Bullion, DO;  Location: [facility] ENDOSCOPY;  Service: Gastroenterology;  Laterality: N/A;  . ESOPHAGOGASTRODUODENOSCOPY (EGD) WITH PROPOFOL N/A 01/10/2018   Procedure: ESOPHAGOGASTRODUODENOSCOPY (EGD) WITH PROPOFOL;  Surgeon: [name], MD;  Location: [place];  Service: Gastroenterology;  Laterality: N/A;  . GIVENS CAPSULE STUDY N/A 11/12/2018   Procedure: GIVENS CAPSULE STUDY;  Surgeon: Milus Banister, MD;  Location: [facility] ENDOSCOPY;   Service: Endoscopy;  Laterality: N/A;  . HEMOSTASIS CLIP PLACEMENT  11/12/2018   Procedure: HEMOSTASIS CLIP PLACEMENT;  Surgeon: Milus Banister, MD;  Location: [place];  Service: Endoscopy;;  . HOT HEMOSTASIS N/A 06/22/2018   Procedure: HOT HEMOSTASIS (ARGON PLASMA COAGULATION/BICAP);  Surgeon: Yetta Flock, MD;  Location: [facility] ENDOSCOPY;  Service: Gastroenterology;  Laterality: N/A;  . HOT HEMOSTASIS N/A 11/12/2018   Procedure: HOT HEMOSTASIS (ARGON PLASMA COAGULATION/BICAP);  Surgeon: Milus Banister, MD;  Location: [facility] ENDOSCOPY;  Service: Endoscopy;  Laterality: N/A;  . IR FLUORO GUIDE CV LINE RIGHT  01/16/2018  . IR FLUORO GUIDE CV LINE RIGHT  01/31/2018  . IR FLUORO GUIDE CV LINE RIGHT  10/06/2018  . IR FLUORO GUIDE CV LINE RIGHT  10/11/2018  . IR US GUIDE VASC ACCESS RIGHT  01/16/2018  . IR US GUIDE VASC ACCESS RIGHT  01/31/2018  . IR US GUIDE VASC ACCESS RIGHT  10/06/2018  . POLYPECTOMY  06/22/2018   Procedure: POLYPECTOMY;  Surgeon: Yetta Flock, MD;  Location: [place];  Service: Gastroenterology;;  . PROSTATE BIOPSY    . right eye surgery     to lower eye pressure  . RIGHT HEART CATH N/A 01/19/2018   Procedure: RIGHT HEART CATH;  Surgeon: Nelva Bush, MD;  Location: [place] CV LAB;  Service: Cardiovascular;  Laterality: N/A;  . TONSILLECTOMY    . VIDEO ASSISTED THORACOSCOPY (VATS)/ LOBECTOMY Right 12/26/2015  . wisdom tteeth ext      reports that he quit smoking about 7 years ago. His smoking use included cigarettes. He smoked 1.00 pack per day. He has never used smokeless tobacco. He reports current alcohol use of about 1.0 standard drinks of alcohol per week. He reports that he does not use drugs. family history includes Cerebral aneurysm in his mother; Diabetes in his mother and sister; Lung cancer in his father. Allergies  Allergen Reactions  . Ambien [Zolpidem Tartrate] Other (See Comments)    Has hallucinations and Homicidal Ideations per Wife    . Lipitor [Atorvastatin] Other (See Comments)    Achy and tired      Outpatient Encounter Medications as of 11/23/2018  Medication Sig  . amLODipine (NORVASC) 5 MG tablet Take 1 tablet (5 mg total) by mouth daily.  . Brinzolamide-Brimonidine (SIMBRINZA) 1-0.2 % SUSP Place 2-3 drops into both eyes 3 (three) times daily.   . carvedilol (COREG) 3.125 MG tablet Take 1 tablet (3.125 mg total) by mouth 2 (two) times daily with a meal.  . ferric citrate (AURYXIA) 1 GM 210 MG(Fe) tablet Take 210 mg by mouth 3 (three) times daily with meals.   . hydrALAZINE (APRESOLINE) 25 MG tablet Take 1 tablet (25 mg total) by mouth every 8 (eight) hours.  [name] Lifitegrast (XIIDRA) 5 % SOLN Place 1 drop into both eyes 2 (two) times daily as needed (dry eyes).  . multivitamin (RENA-VIT) TABS tablet Take 1 tablet by mouth daily.  . Netarsudil-Latanoprost (ROCKLATAN) 0.02-0.005 % SOLN Place 1 drop into both eyes at bedtime.   [name]  Nutritional Supplements (FEEDING SUPPLEMENT, NEPRO CARB STEADY,) LIQD Take 237 mLs by mouth 2 (two) times daily.  . pantoprazole (PROTONIX) 40 MG tablet TAKE 1 TABLET BY MOUTH TWICE A DAY (Patient taking differently: Take 40 mg by mouth 2 (two) times daily. )   No facility-administered encounter medications on file as of 11/23/2018.      REVIEW OF SYSTEMS  : All other systems reviewed and negative except where noted in the History of Present Illness.   PHYSICAL EXAM: BP 130/70   Pulse 87   Temp 98.6 F (37 C)   Ht 5\' 10"  (1.778 m)   Wt 183 lb (83 kg)   BMI 26.26 kg/m  General: Well developed black male in no acute distress Head: Normocephalic and atraumatic Eyes:  Sclerae anicteric, conjunctiva pink. Ears: Normal auditory acuity  Lungs: Clear throughout to auscultation; no increased WOB. Heart: Regular rate and rhythm; SEM noted at right sternal border. Abdomen: Soft, non-distended.  BS present.  Non-tender. Musculoskeletal: Symmetrical with no gross deformities  Skin: No lesions  on visible extremities Extremities: No edema  Neurological: Alert oriented x 4, grossly non-focal Psychological:  Alert and cooperative. 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  Then had 1 slowly oozing AVM in the distal duodenal bulb that was treated with APC and Endo Clip on November 12, 2018.  Small bowel capsule endoscopy revealed 3 nonbleeding AVMs throughout the small bowel.  He continues to have issues with dropping hemoglobin and was just transfused with another unit of packed red blood cells 3 days ago. *ESRD:  On HD MWF *O2 dependent *Moderate aortic stenosis due to bicuspid aortic valve  **We will repeat CBC today.  Depending on the level, we will see if we need to get him set up for outpatient transfusion in the near future.  Will discuss with Dr. Tarri Glenn regarding referral to [facility] for double-balloon enteroscopy.  He certainly would be at high risk for this procedure due to his other comorbidities.  Question who to refer to if we make the decision to send him there.  I am not sure what the other options are other than to very closely monitor his hemoglobin and transfuse him as an outpatient.  He is adamant that he is NOT going back to the ED.  If he continues to bleed then 1 unit of packed red blood cells is not buying him much time between transfusions.  Question the use of octreotide.  CC:  [name], MD

## 2018-11-30 ENCOUNTER — Telehealth: Payer: Self-pay | Admitting: Gastroenterology

## 2018-11-30 ENCOUNTER — Encounter (HOSPITAL_COMMUNITY): Payer: Medicare Other

## 2018-11-30 NOTE — Telephone Encounter (Signed)
Patient's wife is calling- patient was scheduled for a blood transfusion today and she said that they got a phone that it was cancelled. She is asking why it was cancelled and what there next steps are.

## 2018-11-30 NOTE — Progress Notes (Signed)
Reviewed and agree with management plans. Recommend a trial of octreotide LAR (LAR Sandostatin) 20 mg monthly intramuscularly. Will work to get this approved by his insurance.   Eltha Tingley L. Tarri Glenn, MD, MPH

## 2018-11-30 NOTE — Telephone Encounter (Signed)
I spoke with the pt and he is asking why his blood transfusion was cancelled.  I do not see where we ever scheduled a transfusion for today so I can not answer the question of who cancelled the appt.

## 2018-11-30 NOTE — Telephone Encounter (Signed)
I advised the pt to call his PCP and ask if that office set up the transfusion.

## 2018-12-01 ENCOUNTER — Other Ambulatory Visit: Payer: Self-pay | Admitting: *Deleted

## 2018-12-01 ENCOUNTER — Telehealth: Payer: Self-pay | Admitting: Family Medicine

## 2018-12-01 ENCOUNTER — Telehealth: Payer: Self-pay | Admitting: *Deleted

## 2018-12-01 DIAGNOSIS — D62 Acute posthemorrhagic anemia: Secondary | ICD-10-CM

## 2018-12-01 NOTE — Telephone Encounter (Signed)
-----   Message from [name], MD sent at 11/30/2018  4:56 PM EST -----   ----- Message ----- From: Loralie Champagne, PA-C Sent: 11/23/2018   1:31 PM EST To: [name], MD

## 2018-12-01 NOTE — Telephone Encounter (Signed)
Tarsha from dialysis center called in regarding a shot that patient receives to stop internal bleeding . Please advise   Call back number [phone] Ask to speak with any nurse

## 2018-12-01 NOTE — Telephone Encounter (Signed)
Reviewed and agree with managementplans. Recommend a trial of octreotide LAR (LAR Sandostatin) 20 mg monthly intramuscularly. Will work to get this approved by his insurance.   Kimberly L. Beavers, MD, MPH   ----------------- Orders for admission placed for Sandostatin 20 mg monthly.  Patient scheduled for appointment on 11/16 at 10 am.

## 2018-12-01 NOTE — Telephone Encounter (Signed)
Not our patient. Called Tarsha back and informed. We think she may have been trying to reach GI. Phone number given.

## 2018-12-04 ENCOUNTER — Ambulatory Visit (HOSPITAL_COMMUNITY)
Admission: RE | Admit: 2018-12-04 | Discharge: 2018-12-04 | Disposition: A | Discharge status: Home / Self Care | Payer: Medicare Other | Source: Ambulatory Visit | Attending: Internal Medicine | Admitting: Internal Medicine

## 2018-12-04 ENCOUNTER — Other Ambulatory Visit: Payer: Self-pay

## 2018-12-04 DIAGNOSIS — D62 Acute posthemorrhagic anemia: Secondary | ICD-10-CM | POA: Insufficient documentation

## 2018-12-04 MED ORDER — OCTREOTIDE ACETATE 20 MG IM KIT
20.0000 mg | PACK | INTRAMUSCULAR | Status: DC
  Administered 2018-12-04: 20 mg via INTRAMUSCULAR
  Filled 2018-12-04: qty 1

## 2018-12-04 NOTE — Discharge Instructions (Signed)
Octreotide injection solution °What is this medicine? °OCTREOTIDE (ok TREE oh tide) is used to reduce blood levels of growth hormone in patients with a condition called acromegaly. This medicine also reduces flushing and watery diarrhea caused by certain types of cancer. °This medicine may be used for other purposes; ask your health care provider or pharmacist if you have questions. °COMMON BRAND NAME(S): Bynfezia, Sandostatin, Sandostatin LAR °What should I tell my health care provider before I take this medicine? °They need to know if you have any of these conditions: °· diabetes °· gallbladder disease °· kidney disease °· liver disease °· an unusual or allergic reaction to octreotide, other medicines, foods, dyes, or preservatives °· pregnant or trying to get pregnant °· breast-feeding °How should I use this medicine? °This medicine is for injection under the skin or into a vein (only in emergency situations). It is usually given by a health care professional in a hospital or clinic setting. °If you get this medicine at home, you will be taught how to prepare and give this medicine. Allow the injection solution to come to room temperature before use. Do not warm it artificially. Use exactly as directed. Take your medicine at regular intervals. Do not take your medicine more often than directed. °It is important that you put your used needles and syringes in a special sharps container. Do not put them in a trash can. If you do not have a sharps container, call your pharmacist or healthcare provider to get one. °Talk to your pediatrician regarding the use of this medicine in children. Special care may be needed. °Overdosage: If you think you have taken too much of this medicine contact a poison control center or emergency room at once. °NOTE: This medicine is only for you. Do not share this medicine with others. °What if I miss a dose? °If you miss a dose, take it as soon as you can. If it is almost time for your  next dose, take only that dose. Do not take double or extra doses. °What may interact with this medicine? °Do not take this medicine with any of the following medications: °· cisapride °· droperidol °· general anesthetics °· grepafloxacin °· perphenazine °· thioridazine °This medicine may also interact with the following medications: °· bromocriptine °· cyclosporine °· diuretics °· medicines for blood pressure, heart disease, irregular heart beat °· medicines for diabetes, including insulin °· quinidine °This list may not describe all possible interactions. Give your health care provider a list of all the medicines, herbs, non-prescription drugs, or dietary supplements you use. Also tell them if you smoke, drink alcohol, or use illegal drugs. Some items may interact with your medicine. °What should I watch for while using this medicine? °Visit your doctor or health care professional for regular checks on your progress. °To help reduce irritation at the injection site, use a different site for each injection and make sure the solution is at room temperature before use. °This medicine may cause decreases in blood sugar. Signs of low blood sugar include chills, cool, pale skin or cold sweats, drowsiness, extreme hunger, fast heartbeat, headache, nausea, nervousness or anxiety, shakiness, trembling, unsteadiness, tiredness, or weakness. Contact your doctor or health care professional right away if you experience any of these symptoms. °This medicine may increase blood sugar. Ask your healthcare provider if changes in diet or medicines are needed if you have diabetes. °This medicine may cause a decrease in vitamin B12. You should make sure that you get enough vitamin B12   while you are taking this medicine. Discuss the foods you eat and the vitamins you take with your health care professional. °What side effects may I notice from receiving this medicine? °Side effects that you should report to your doctor or health care  professional as soon as possible: °· allergic reactions like skin rash, itching or hives, swelling of the face, lips, or tongue °· decreases in blood sugar °· changes in heart rate °· severe stomach pain °·  °signs and symptoms of high blood sugar such as being more thirsty or hungry or having to urinate more than normal. You may also feel very tired or have blurry vision. °Side effects that usually do not require medical attention (report to your doctor or health care professional if they continue or are bothersome): °· diarrhea or constipation °· gas or stomach pain °· nausea, vomiting °· pain, redness, swelling and irritation at site where injected °This list may not describe all possible side effects. Call your doctor for medical advice about side effects. You may report side effects to FDA at 1-800-FDA-1088. °Where should I keep my medicine? °Keep out of the reach of children. °Store in a refrigerator between 2 and 8 degrees C (36 and 46 degrees F). Protect from light. Allow to come to room temperature naturally. Do not use artificial heat. If protected from light, the injection may be stored at room temperature between 20 and 30 degrees C (70 and 86 degrees F) for 14 days. After the initial use, throw away any unused portion of a multiple dose vial after 14 days. Throw away unused portions of the ampules after use. °NOTE: This sheet is a summary. It may not cover all possible information. If you have questions about this medicine, talk to your doctor, pharmacist, or health care provider. °© 2020 [employer] (2017-10-13 08:07:09) ° °

## 2018-12-04 NOTE — Progress Notes (Signed)
Patient was given IM octreotide in the left gluteal muscle by Jena Gauss DNP. Tolerated well, discharge instructions given, verbalized understanding. Patient alert, oriented and ambulatory at the time of discharge.

## 2018-12-08 ENCOUNTER — Encounter: Payer: Self-pay | Admitting: Family Medicine

## 2019-01-05 ENCOUNTER — Other Ambulatory Visit: Payer: Self-pay

## 2019-01-05 ENCOUNTER — Ambulatory Visit (HOSPITAL_COMMUNITY)
Admission: RE | Admit: 2019-01-05 | Discharge: 2019-01-05 | Disposition: A | Discharge status: Home / Self Care | Payer: Medicare Other | Source: Ambulatory Visit | Attending: Internal Medicine | Admitting: Internal Medicine

## 2019-01-05 DIAGNOSIS — K31819 Angiodysplasia of stomach and duodenum without bleeding: Secondary | ICD-10-CM | POA: Insufficient documentation

## 2019-01-05 DIAGNOSIS — D500 Iron deficiency anemia secondary to blood loss (chronic): Secondary | ICD-10-CM | POA: Diagnosis present

## 2019-01-05 MED ORDER — OCTREOTIDE ACETATE 20 MG IM KIT
20.0000 mg | PACK | Freq: Once | INTRAMUSCULAR | Status: AC
  Administered 2019-01-05: 10:00:00 20 mg via INTRAMUSCULAR
  Filled 2019-01-05: qty 1

## 2019-01-05 NOTE — Progress Notes (Signed)
PATIENT CARE CENTER NOTE  Diagnosis: Acute blood loss anemia (D62)   Provider: [name], MD   Procedure: IM Sandostatin (Octreotide)    Note: Patient received IM Sandostatin injection in the left hip. Tolerated well. Discharge instructions given. Patient alert, oriented and ambulatory at discharge.

## 2019-01-05 NOTE — Discharge Instructions (Signed)
Octreotide injection solution °What is this medicine? °OCTREOTIDE (ok TREE oh tide) is used to reduce blood levels of growth hormone in patients with a condition called acromegaly. This medicine also reduces flushing and watery diarrhea caused by certain types of cancer. °This medicine may be used for other purposes; ask your health care provider or pharmacist if you have questions. °COMMON BRAND NAME(S): Bynfezia, Sandostatin, Sandostatin LAR °What should I tell my health care provider before I take this medicine? °They need to know if you have any of these conditions: °· diabetes °· gallbladder disease °· kidney disease °· liver disease °· an unusual or allergic reaction to octreotide, other medicines, foods, dyes, or preservatives °· pregnant or trying to get pregnant °· breast-feeding °How should I use this medicine? °This medicine is for injection under the skin or into a vein (only in emergency situations). It is usually given by a health care professional in a hospital or clinic setting. °If you get this medicine at home, you will be taught how to prepare and give this medicine. Allow the injection solution to come to room temperature before use. Do not warm it artificially. Use exactly as directed. Take your medicine at regular intervals. Do not take your medicine more often than directed. °It is important that you put your used needles and syringes in a special sharps container. Do not put them in a trash can. If you do not have a sharps container, call your pharmacist or healthcare provider to get one. °Talk to your pediatrician regarding the use of this medicine in children. Special care may be needed. °Overdosage: If you think you have taken too much of this medicine contact a poison control center or emergency room at once. °NOTE: This medicine is only for you. Do not share this medicine with others. °What if I miss a dose? °If you miss a dose, take it as soon as you can. If it is almost time for your  next dose, take only that dose. Do not take double or extra doses. °What may interact with this medicine? °Do not take this medicine with any of the following medications: °· cisapride °· droperidol °· general anesthetics °· grepafloxacin °· perphenazine °· thioridazine °This medicine may also interact with the following medications: °· bromocriptine °· cyclosporine °· diuretics °· medicines for blood pressure, heart disease, irregular heart beat °· medicines for diabetes, including insulin °· quinidine °This list may not describe all possible interactions. Give your health care provider a list of all the medicines, herbs, non-prescription drugs, or dietary supplements you use. Also tell them if you smoke, drink alcohol, or use illegal drugs. Some items may interact with your medicine. °What should I watch for while using this medicine? °Visit your doctor or health care professional for regular checks on your progress. °To help reduce irritation at the injection site, use a different site for each injection and make sure the solution is at room temperature before use. °This medicine may cause decreases in blood sugar. Signs of low blood sugar include chills, cool, pale skin or cold sweats, drowsiness, extreme hunger, fast heartbeat, headache, nausea, nervousness or anxiety, shakiness, trembling, unsteadiness, tiredness, or weakness. Contact your doctor or health care professional right away if you experience any of these symptoms. °This medicine may increase blood sugar. Ask your healthcare provider if changes in diet or medicines are needed if you have diabetes. °This medicine may cause a decrease in vitamin B12. You should make sure that you get enough vitamin B12   while you are taking this medicine. Discuss the foods you eat and the vitamins you take with your health care professional. °What side effects may I notice from receiving this medicine? °Side effects that you should report to your doctor or health care  professional as soon as possible: °· allergic reactions like skin rash, itching or hives, swelling of the face, lips, or tongue °· decreases in blood sugar °· changes in heart rate °· severe stomach pain °·  °signs and symptoms of high blood sugar such as being more thirsty or hungry or having to urinate more than normal. You may also feel very tired or have blurry vision. °Side effects that usually do not require medical attention (report to your doctor or health care professional if they continue or are bothersome): °· diarrhea or constipation °· gas or stomach pain °· nausea, vomiting °· pain, redness, swelling and irritation at site where injected °This list may not describe all possible side effects. Call your doctor for medical advice about side effects. You may report side effects to FDA at 1-800-FDA-1088. °Where should I keep my medicine? °Keep out of the reach of children. °Store in a refrigerator between 2 and 8 degrees C (36 and 46 degrees F). Protect from light. Allow to come to room temperature naturally. Do not use artificial heat. If protected from light, the injection may be stored at room temperature between 20 and 30 degrees C (70 and 86 degrees F) for 14 days. After the initial use, throw away any unused portion of a multiple dose vial after 14 days. Throw away unused portions of the ampules after use. °NOTE: This sheet is a summary. It may not cover all possible information. If you have questions about this medicine, talk to your doctor, pharmacist, or health care provider. °© 2020 [employer] (2017-10-13 08:07:09) ° °

## 2019-01-08 ENCOUNTER — Other Ambulatory Visit: Payer: Self-pay

## 2019-01-08 DIAGNOSIS — N183 Chronic kidney disease, stage 3 unspecified: Secondary | ICD-10-CM

## 2019-01-09 ENCOUNTER — Ambulatory Visit (INDEPENDENT_AMBULATORY_CARE_PROVIDER_SITE_OTHER): Payer: Medicare Other | Admitting: Vascular Surgery

## 2019-01-09 ENCOUNTER — Encounter: Payer: Self-pay | Admitting: Vascular Surgery

## 2019-01-09 ENCOUNTER — Other Ambulatory Visit: Payer: Self-pay | Admitting: *Deleted

## 2019-01-09 ENCOUNTER — Telehealth: Payer: Self-pay | Admitting: *Deleted

## 2019-01-09 ENCOUNTER — Other Ambulatory Visit: Payer: Self-pay

## 2019-01-09 ENCOUNTER — Encounter: Payer: Self-pay | Admitting: *Deleted

## 2019-01-09 ENCOUNTER — Ambulatory Visit (HOSPITAL_COMMUNITY)
Admission: RE | Admit: 2019-01-09 | Discharge: 2019-01-09 | Disposition: A | Discharge status: Home / Self Care | Payer: Medicare Other | Source: Ambulatory Visit | Attending: Vascular Surgery | Admitting: Vascular Surgery

## 2019-01-09 ENCOUNTER — Ambulatory Visit (INDEPENDENT_AMBULATORY_CARE_PROVIDER_SITE_OTHER)
Admission: RE | Admit: 2019-01-09 | Discharge: 2019-01-09 | Disposition: A | Discharge status: Home / Self Care | Payer: Medicare Other | Source: Ambulatory Visit | Attending: Vascular Surgery | Admitting: Vascular Surgery

## 2019-01-09 VITALS — BP 152/76 | HR 74 | Temp 97.7°F | Resp 20 | Ht 70.0 in | Wt 183.0 lb

## 2019-01-09 DIAGNOSIS — N186 End stage renal disease: Secondary | ICD-10-CM | POA: Diagnosis not present

## 2019-01-09 DIAGNOSIS — N183 Chronic kidney disease, stage 3 unspecified: Secondary | ICD-10-CM

## 2019-01-09 DIAGNOSIS — Z992 Dependence on renal dialysis: Secondary | ICD-10-CM | POA: Diagnosis not present

## 2019-01-09 NOTE — Progress Notes (Signed)
Reviewed pre-op instructions with wife(see letter). As well as instruction letter faxed to [facility].Verbalized understanding.

## 2019-01-09 NOTE — Telephone Encounter (Signed)
Confirmed with Lauren at [facility] fax received and they will review with patient.

## 2019-01-09 NOTE — Progress Notes (Signed)
[facility] [place]  Patient name: Andrew Guerra MRN: [id number] DOB: [birth year] Sex: male  REASON FOR CONSULT: Discuss access for hemodialysis  HPI: Andrew Guerra is a 78 y.o. male, who is here today for discussion of access for hemodialysis.  He currently is undergoing dialysis on Monday Wednesday Friday via right IJ tunnel catheter.  He has never had arm access.  He is left-handed.  He does not have a pacemaker.  Past Medical History:  Diagnosis Date  . Anemia   . 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  [name] Cataract    right eye - surgery to remove  . Chronic kidney disease   . Elevated PSA 11/29/2016   [facility].  Most recent PSA near 11.  He has had atypia on biopsy.  November 2008  . Essential hypertension, benign 06/08/2012  . Full dentures   . GI bleed   . Glaucoma   . Heart murmur    since childhood, never has caused any problems  . History of rheumatic fever 11/04/2015  . Hyperlipidemia 04/25/2013  . Hypertension   . Malignant neoplasm of left lung ([facility]) 02/08/2017  . RBBB 07/12/2017  . Sickle cell trait (HCC)    no problems per patient    Family History  Problem Relation Age of Onset  . Diabetes Mother   . Cerebral aneurysm Mother   . Lung cancer Father   . Diabetes Sister   . Colon cancer Neg Hx   . Colon polyps Neg Hx   . Esophageal cancer Neg Hx   . Rectal cancer Neg Hx   . Stomach cancer Neg Hx     SOCIAL HISTORY: Social History   Socioeconomic History  . Marital status: Married    Spouse name: Benjamine Mola  . Number of children: 2  . Years of education: Not on file  . Highest education level: Not on file  Occupational History  . Occupation: retired  Tobacco Use  . Smoking status: Former Smoker    Packs/day: 1.00    Types: Cigarettes    Quit  date: 01/19/2011    Years since quitting: 7.9  . Smokeless tobacco: Never Used  Substance and Sexual Activity  . Alcohol use: Yes    Alcohol/week: 1.0 standard drinks    Types: 1 Shots of liquor per week    Comment: rarely  . Drug use: No  . Sexual activity: Not on file  Other Topics Concern  . Not on file  Social History Narrative  . Not on file   Social Determinants of Health   Financial Resource Strain:   . Difficulty of Paying Living Expenses: Not on file  Food Insecurity:   . Worried About Charity fundraiser in the Last Year: Not on file  . Ran Out of Food in the Last Year: Not on file  Transportation Needs:   . Lack of Transportation (Medical): Not on file  . Lack of Transportation (Non-Medical): Not on file  Physical Activity:   . Days of Exercise per Week: Not on file  . Minutes of Exercise per Session: Not on file  Stress:   . Feeling of Stress : Not on file  Social Connections:   . Frequency of Communication with Friends and Family: Not on file  . Frequency of Social Gatherings with Friends and Family:  Not on file  . Attends Religious Services: Not on file  . Active Member of Clubs or Organizations: Not on file  . Attends Archivist Meetings: Not on file  . Marital Status: Not on file  Intimate Partner Violence:   . Fear of Current or Ex-Partner: Not on file  . Emotionally Abused: Not on file  . Physically Abused: Not on file  . Sexually Abused: Not on file    Allergies  Allergen Reactions  . Ambien [Zolpidem Tartrate] Other (See Comments)    Has hallucinations and Homicidal Ideations per Wife   . Lipitor [Atorvastatin] Other (See Comments)    Achy and tired    Current Outpatient Medications  Medication Sig Dispense Refill  . amLODipine (NORVASC) 5 MG tablet Take 1 tablet (5 mg total) by mouth daily. 30 tablet 0  . Brinzolamide-Brimonidine (SIMBRINZA) 1-0.2 % SUSP Place 2-3 drops into both eyes 3 (three) times daily.     . carvedilol (COREG)  3.125 MG tablet Take 1 tablet (3.125 mg total) by mouth 2 (two) times daily with a meal. 60 tablet 0  . ferric citrate (AURYXIA) 1 GM 210 MG(Fe) tablet Take 210 mg by mouth 3 (three) times daily with meals.     . hydrALAZINE (APRESOLINE) 25 MG tablet Take 1 tablet (25 mg total) by mouth every 8 (eight) hours. 90 tablet 0  . Lifitegrast (XIIDRA) 5 % SOLN Place 1 drop into both eyes 2 (two) times daily as needed (dry eyes).    . multivitamin (RENA-VIT) TABS tablet Take 1 tablet by mouth daily.    . Netarsudil-Latanoprost (ROCKLATAN) 0.02-0.005 % SOLN Place 1 drop into both eyes at bedtime.     . Nutritional Supplements (FEEDING SUPPLEMENT, NEPRO CARB STEADY,) LIQD Take 237 mLs by mouth 2 (two) times daily.    . Octreotide Acetate (SANDOSTATIN IJ)     . pantoprazole (PROTONIX) 40 MG tablet TAKE 1 TABLET BY MOUTH TWICE A DAY (Patient taking differently: Take 40 mg by mouth 2 (two) times daily. ) 180 tablet 1  . sevelamer carbonate (RENVELA) 800 MG tablet TAKE 1 TABLET BY MOUTH THREE TIMES A DAY WITH MEALS AND 1 TABLET TWICE A DAY WITH SNACKS     No current facility-administered medications for this visit.    REVIEW OF SYSTEMS:  [X]  denotes positive finding, [ ]  denotes negative finding Cardiac  Comments:  Chest pain or chest pressure:    Shortness of breath upon exertion:    Short of breath when lying flat:    Irregular heart rhythm: x       Vascular    Pain in calf, thigh, or hip brought on by ambulation:    Pain in feet at night that wakes you up from your sleep:     Blood clot in your veins:    Leg swelling:         Pulmonary    Oxygen at home:    Productive cough:     Wheezing:         Neurologic    Sudden weakness in arms or legs:     Sudden numbness in arms or legs:     Sudden onset of difficulty speaking or slurred speech:    Temporary loss of vision in one eye:     Problems with dizziness:         Gastrointestinal    Blood in stool:     Vomited blood:  Genitourinary    Burning when urinating:     Blood in urine:        Psychiatric    Major depression:         Hematologic    Bleeding problems:    Problems with blood clotting too easily:        Skin    Rashes or ulcers:        Constitutional    Fever or chills:      PHYSICAL EXAM: Vitals:   01/09/19 1044  BP: (!) 152/76  Pulse: 74  Resp: 20  Temp: 97.7 F (36.5 C)  SpO2: 100%  Weight: 183 lb (83 kg)  Height: 5\' 10"  (1.778 m)    GENERAL: The patient is a well-nourished male, in no acute distress. The vital signs are documented above. CARDIOVASCULAR: 2+ radial pulses bilaterally.  Very small surface veins bilaterally.  He is thin and the right saphenous vein is visible from his wrist all the way to his antecubital space.  It does appear that his main outflow is into the basilic vein PULMONARY: There is good air exchange  ABDOMEN: Soft and non-tender  MUSCULOSKELETAL: There are no major deformities or cyanosis. NEUROLOGIC: No focal weakness or paresthesias are detected. SKIN: There are no ulcers or rashes noted. PSYCHIATRIC: The patient has a normal affect.  DATA:  Upper extremity arterial and venous studies were reviewed today.  This showed normal arterial flow to the radial and ulnar arteries bilaterally.  He does have small cephalic and basilic veins bilaterally  MEDICAL ISSUES: Had a long discussion with the patient regarding options for long-term hemodialysis.  I discussed AV fistula and AV graft.  He is left handed so would recommend a right arm access.  We will like to schedule this on a nondialysis day.  Explained that we would make a determination of AV fistula versus graft at the time of surgery depending on his veins.  His vein map certainly does not look very promising for fistula.   Rosetta Posner, MD FACS [facility] Tel [phone] Pager [phone]

## 2019-01-09 NOTE — H&P (View-Only) (Signed)
[facility] of [place]  Patient name: Andrew Guerra MRN: [id number] DOB: 1940-11-11 Sex: male  REASON FOR CONSULT: Discuss access for hemodialysis  HPI: Andrew Guerra is a 78 y.o. male, who is here today for discussion of access for hemodialysis.  He currently is undergoing dialysis on Monday Wednesday Friday via right IJ tunnel catheter.  He has never had arm access.  He is left-handed.  He does not have a pacemaker.  Past Medical History:  Diagnosis Date  . Anemia   . 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  [name] Cataract    right eye - surgery to remove  . Chronic kidney disease   . Elevated PSA 11/29/2016   [facility].  Most recent PSA near 11.  He has had atypia on biopsy.  November 2008  . Essential hypertension, benign 06/08/2012  . Full dentures   . GI bleed   . Glaucoma   . Heart murmur    since childhood, never has caused any problems  . History of rheumatic fever 11/04/2015  . Hyperlipidemia 04/25/2013  . Hypertension   . Malignant neoplasm of left lung ([facility]) 02/08/2017  . RBBB 07/12/2017  . Sickle cell trait (HCC)    no problems per patient    Family History  Problem Relation Age of Onset  . Diabetes Mother   . Cerebral aneurysm Mother   . Lung cancer Father   . Diabetes Sister   . Colon cancer Neg Hx   . Colon polyps Neg Hx   . Esophageal cancer Neg Hx   . Rectal cancer Neg Hx   . Stomach cancer Neg Hx     SOCIAL HISTORY: Social History   Socioeconomic History  . Marital status: Married    Spouse name: Benjamine Mola  . Number of children: 2  . Years of education: Not on file  . Highest education level: Not on file  Occupational History  . Occupation: retired  Tobacco Use  . Smoking status: Former Smoker    Packs/day: 1.00    Types: Cigarettes    Quit  date: 01/19/2011    Years since quitting: 7.9  . Smokeless tobacco: Never Used  Substance and Sexual Activity  . Alcohol use: Yes    Alcohol/week: 1.0 standard drinks    Types: 1 Shots of liquor per week    Comment: rarely  . Drug use: No  . Sexual activity: Not on file  Other Topics Concern  . Not on file  Social History Narrative  . Not on file   Social Determinants of Health   Financial Resource Strain:   . Difficulty of Paying Living Expenses: Not on file  Food Insecurity:   . Worried About Charity fundraiser in the Last Year: Not on file  . Ran Out of Food in the Last Year: Not on file  Transportation Needs:   . Lack of Transportation (Medical): Not on file  . Lack of Transportation (Non-Medical): Not on file  Physical Activity:   . Days of Exercise per Week: Not on file  . Minutes of Exercise per Session: Not on file  Stress:   . Feeling of Stress : Not on file  Social Connections:   . Frequency of Communication with Friends and Family: Not on file  . Frequency of Social Gatherings with Friends and Family:  Not on file  . Attends Religious Services: Not on file  . Active Member of Clubs or Organizations: Not on file  . Attends Archivist Meetings: Not on file  . Marital Status: Not on file  Intimate Partner Violence:   . Fear of Current or Ex-Partner: Not on file  . Emotionally Abused: Not on file  . Physically Abused: Not on file  . Sexually Abused: Not on file    Allergies  Allergen Reactions  . Ambien [Zolpidem Tartrate] Other (See Comments)    Has hallucinations and Homicidal Ideations per Wife   . Lipitor [Atorvastatin] Other (See Comments)    Achy and tired    Current Outpatient Medications  Medication Sig Dispense Refill  . amLODipine (NORVASC) 5 MG tablet Take 1 tablet (5 mg total) by mouth daily. 30 tablet 0  . Brinzolamide-Brimonidine (SIMBRINZA) 1-0.2 % SUSP Place 2-3 drops into both eyes 3 (three) times daily.     . carvedilol (COREG)  3.125 MG tablet Take 1 tablet (3.125 mg total) by mouth 2 (two) times daily with a meal. 60 tablet 0  . ferric citrate (AURYXIA) 1 GM 210 MG(Fe) tablet Take 210 mg by mouth 3 (three) times daily with meals.     . hydrALAZINE (APRESOLINE) 25 MG tablet Take 1 tablet (25 mg total) by mouth every 8 (eight) hours. 90 tablet 0  . Lifitegrast (XIIDRA) 5 % SOLN Place 1 drop into both eyes 2 (two) times daily as needed (dry eyes).    . multivitamin (RENA-VIT) TABS tablet Take 1 tablet by mouth daily.    . Netarsudil-Latanoprost (ROCKLATAN) 0.02-0.005 % SOLN Place 1 drop into both eyes at bedtime.     . Nutritional Supplements (FEEDING SUPPLEMENT, NEPRO CARB STEADY,) LIQD Take 237 mLs by mouth 2 (two) times daily.    . Octreotide Acetate (SANDOSTATIN IJ)     . pantoprazole (PROTONIX) 40 MG tablet TAKE 1 TABLET BY MOUTH TWICE A DAY (Patient taking differently: Take 40 mg by mouth 2 (two) times daily. ) 180 tablet 1  . sevelamer carbonate (RENVELA) 800 MG tablet TAKE 1 TABLET BY MOUTH THREE TIMES A DAY WITH MEALS AND 1 TABLET TWICE A DAY WITH SNACKS     No current facility-administered medications for this visit.    REVIEW OF SYSTEMS:  [X]  denotes positive finding, [ ]  denotes negative finding Cardiac  Comments:  Chest pain or chest pressure:    Shortness of breath upon exertion:    Short of breath when lying flat:    Irregular heart rhythm: x       Vascular    Pain in calf, thigh, or hip brought on by ambulation:    Pain in feet at night that wakes you up from your sleep:     Blood clot in your veins:    Leg swelling:         Pulmonary    Oxygen at home:    Productive cough:     Wheezing:         Neurologic    Sudden weakness in arms or legs:     Sudden numbness in arms or legs:     Sudden onset of difficulty speaking or slurred speech:    Temporary loss of vision in one eye:     Problems with dizziness:         Gastrointestinal    Blood in stool:     Vomited blood:  Genitourinary    Burning when urinating:     Blood in urine:        Psychiatric    Major depression:         Hematologic    Bleeding problems:    Problems with blood clotting too easily:        Skin    Rashes or ulcers:        Constitutional    Fever or chills:      PHYSICAL EXAM: Vitals:   01/09/19 1044  BP: (!) 152/76  Pulse: 74  Resp: 20  Temp: 97.7 F (36.5 C)  SpO2: 100%  Weight: 183 lb (83 kg)  Height: 5\' 10"  (1.778 m)    GENERAL: The patient is a well-nourished male, in no acute distress. The vital signs are documented above. CARDIOVASCULAR: 2+ radial pulses bilaterally.  Very small surface veins bilaterally.  He is thin and the right saphenous vein is visible from his wrist all the way to his antecubital space.  It does appear that his main outflow is into the basilic vein PULMONARY: There is good air exchange  ABDOMEN: Soft and non-tender  MUSCULOSKELETAL: There are no major deformities or cyanosis. NEUROLOGIC: No focal weakness or paresthesias are detected. SKIN: There are no ulcers or rashes noted. PSYCHIATRIC: The patient has a normal affect.  DATA:  Upper extremity arterial and venous studies were reviewed today.  This showed normal arterial flow to the radial and ulnar arteries bilaterally.  He does have small cephalic and basilic veins bilaterally  MEDICAL ISSUES: Had a long discussion with the patient regarding options for long-term hemodialysis.  I discussed AV fistula and AV graft.  He is left handed so would recommend a right arm access.  We will like to schedule this on a nondialysis day.  Explained that we would make a determination of AV fistula versus graft at the time of surgery depending on his veins.  His vein map certainly does not look very promising for fistula.   Rosetta Posner, MD FACS [facility] Tel [phone] Pager [phone]

## 2019-01-16 ENCOUNTER — Telehealth: Payer: Self-pay | Admitting: *Deleted

## 2019-01-16 NOTE — Telephone Encounter (Signed)
-----   Message from Loralie Champagne, PA-C sent at 01/16/2019  1:39 PM EST ----- Hello Dr. Tarri Glenn.  I received records on this patient from his visit at [facility] with Dr. 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  I will send Dr. Sharee Pimple note to be scanned, but he says that he is at increased risk for double-balloon enteroscopy given his comorbidities.  He does not recommend double-balloon enteroscopy at this time given those comorbidities and the fact that he has improved since starting Sandostatin.  He recommends continuing supportive care with Sandostatin and if his anemia progresses he would start by repeating push enteroscopy as it he thinks it would be lower risk than double-balloon enteroscopy and potentially equally as fruitful.  Thank you,  Suzie Portela, just FYI as well and if you could save this message to his chart, please.  Thank you!!

## 2019-01-16 NOTE — Telephone Encounter (Signed)
Message below saved to patient's chart at [facility] request.

## 2019-01-20 DIAGNOSIS — N186 End stage renal disease: Secondary | ICD-10-CM | POA: Diagnosis not present

## 2019-01-20 DIAGNOSIS — Z992 Dependence on renal dialysis: Secondary | ICD-10-CM | POA: Diagnosis not present

## 2019-01-20 DIAGNOSIS — N2581 Secondary hyperparathyroidism of renal origin: Secondary | ICD-10-CM | POA: Diagnosis not present

## 2019-01-22 ENCOUNTER — Other Ambulatory Visit (HOSPITAL_COMMUNITY)
Admission: RE | Admit: 2019-01-22 | Discharge: 2019-01-22 | Disposition: A | Discharge status: Home / Self Care | Payer: Medicare PPO | Source: Ambulatory Visit | Attending: Vascular Surgery | Admitting: Vascular Surgery

## 2019-01-22 DIAGNOSIS — Z992 Dependence on renal dialysis: Secondary | ICD-10-CM | POA: Diagnosis not present

## 2019-01-22 DIAGNOSIS — Z01812 Encounter for preprocedural laboratory examination: Secondary | ICD-10-CM | POA: Diagnosis not present

## 2019-01-22 DIAGNOSIS — N2581 Secondary hyperparathyroidism of renal origin: Secondary | ICD-10-CM | POA: Diagnosis not present

## 2019-01-22 DIAGNOSIS — Z20822 Contact with and (suspected) exposure to covid-19: Secondary | ICD-10-CM | POA: Insufficient documentation

## 2019-01-22 DIAGNOSIS — N186 End stage renal disease: Secondary | ICD-10-CM | POA: Diagnosis not present

## 2019-01-23 ENCOUNTER — Encounter (HOSPITAL_COMMUNITY): Payer: Self-pay | Admitting: Vascular Surgery

## 2019-01-23 LAB — NOVEL CORONAVIRUS, NAA (HOSP ORDER, SEND-OUT TO REF LAB; TAT 18-24 HRS): SARS-CoV-2, NAA: NOT DETECTED

## 2019-01-23 MED ORDER — SODIUM CHLORIDE 0.9 % IV SOLN: 100.0000 mL | INTRAVENOUS | Status: DC | PRN

## 2019-01-23 MED ORDER — HEPARIN SODIUM (PORCINE) 1000 UNIT/ML DIALYSIS
1000.0000 [IU] | INTRAMUSCULAR | Status: DC | PRN
  Filled 2019-01-23: qty 1

## 2019-01-23 MED ORDER — PENTAFLUOROPROP-TETRAFLUOROETH EX AERO
1.0000 "application " | INHALATION_SPRAY | CUTANEOUS | Status: DC | PRN
  Filled 2019-01-23: qty 116

## 2019-01-23 MED ORDER — LIDOCAINE-PRILOCAINE 2.5-2.5 % EX CREA
1.0000 "application " | TOPICAL_CREAM | CUTANEOUS | Status: DC | PRN
  Filled 2019-01-23: qty 5

## 2019-01-23 MED ORDER — ALTEPLASE 2 MG IJ SOLR
2.0000 mg | Freq: Once | INTRAMUSCULAR | Status: DC | PRN
  Filled 2019-01-23: qty 2

## 2019-01-23 MED ORDER — LIDOCAINE HCL (PF) 1 % IJ SOLN
5.0000 mL | INTRAMUSCULAR | Status: DC | PRN
  Filled 2019-01-23: qty 5

## 2019-01-23 NOTE — Progress Notes (Signed)
Mr Andrew Guerra denies chest pain or shortness of breath. Mr Andrew Guerra was tested for Covid 01/21/2018 - he has been in quarantine with his wife since that time.Mr Andrew Guerra will go to Dialysis Wednesday and wear face mask while there.  Andrew Guerra has a history of aortic valve reguration and stenosis and PAF. I asked Anestology PA-C to  Review chart.

## 2019-01-24 DIAGNOSIS — N186 End stage renal disease: Secondary | ICD-10-CM | POA: Diagnosis not present

## 2019-01-24 DIAGNOSIS — Z992 Dependence on renal dialysis: Secondary | ICD-10-CM | POA: Diagnosis not present

## 2019-01-24 DIAGNOSIS — N2581 Secondary hyperparathyroidism of renal origin: Secondary | ICD-10-CM | POA: Diagnosis not present

## 2019-01-24 NOTE — Progress Notes (Signed)
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 He was started on hemodialysis in late September and needs permanent HD access.  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   Cardiologist is Dr. Radford Pax last evaluation 05/2018. Last echo 09/2018 during nephrotic syndrome/progression to ESRD admission showed normal LVEF, basal and mid inferior/inferolateral hypokinesis, normal RV systolic function, RVSP 65.6, large left pleural effsuion. Hemodialysis was initiated. On-going out-patient cardiology follow-up advised. He denied chest pain and SOB at PAT RN phone interview and is undergoing hemodialysis 3x/weekly.  Presurgical COVID-19 test on 01/22/19 was negative. He is a same day work-up, so further evaluation by his anesthesia team on the day of  surgery.   VS: Ht 5\' 11"  (1.803 m)   Wt 81.6 kg   BMI 25.10 kg/m    PROVIDERS: [name], MD is PCP - Fransico Him, MD is cardiologist. First established in 07/12/17 for AS, HTN. Last evaluation 05/30/18. Yearly monitoring of AS. - Camnitz, Will, MD is EP cardiologist. He felt afib with RVR episode was related to acute GI bleed. Not on anticoagulation due to GI bleed. No afib on 05/2018 event monitor.  [name], MD is GI. Also referred to [facility] and saw Cristi Loron, MD on 01/02/19. By notes, "He recommends continuing supportive care with Sandostatin and if his anemia progresses he would start by repeating push enteroscopy as it he thinks it would be lower risk than double-balloon enteroscopy and potentially equally as fruitful." - Elmarie Shiley, MD is nephrolgoist   LABS: He is for labs on arrival. H/H 8.1/24.6 on 11/23/18. A1c 5.0% 01/15/18.   PFTs 10/24/17: FVC 2.45 (64%), post 2.35 (61%). FEV1 1.63 (57%), post 1.72 (61%). DLCO unc 8.00 (23%).   IMAGES: 1V PCXR 11/21/18: FINDINGS: - RIGHT jugular dual-lumen central venous catheter with tip projecting over cavoatrial junction. - Normal heart size mediastinal contours. - Atherosclerotic calcification aorta. - New LEFT basilar infiltrate. - Chronic RIGHT basilar and infrahilar opacity. - Small bibasilar pleural effusions. - No pneumothorax. IMPRESSION: Small bibasilar effusions with chronic RIGHT basilar changes and new LEFT lower lobe infiltrate.   EKG: 11/17/18: Normal sinus rhythm Possible Left atrial enlargement Right bundle branch block T wave abnormality, consider inferolateral ischemia Abnormal ECG When compared with ECG of 11/10/2018, No significant change was found Confirmed by Delora Fuel ([id number]) on 11/18/2018 12:20:21 AM -Artifact and baseline wanderer noted, appears stable when compared to prior tracing. but  Noted to have RBBB, LAFB, bifascicular block, inferior infarct (age undetermined) on  03/28/18 tracing from [facility]   CV: Echo 10/05/18 (in setting of nephrotic syndrome, progression to ESRD; ongoing out-patient cardiology follow-up advised): IMPRESSIONS  1. Left ventricular ejection fraction, by visual estimation, is 55 to 60%. The left ventricle has normal function. Normal left ventricular size. Left ventricular septal wall thickness was mildly increased. Mildly increased left ventricular posterior  wall thickness. There is mildly increased left ventricular hypertrophy.  2. Basal and mid inferior wall and basal and mid inferolateral wall are abnormal.  3. Right ventricular pressure overload.  4. Global right ventricle has normal systolic function.The right ventricular size is normal. No increase in right ventricular wall thickness.  5. Left atrial size was severely dilated.  6. Right atrial size was severely dilated.  7. Large pleural effusion in the left lateral region.  8. Mild mitral annular calcification.  9. The mitral valve is normal in structure. No evidence of mitral valve regurgitation. No evidence of mitral stenosis. 10. The tricuspid valve is normal in structure. Tricuspid valve regurgitation is mild. 11. The aortic valve The aortic valve is normal in structure. Aortic valve regurgitation is mild to moderate by color flow Doppler. Moderate aortic valve stenosis. AV Area (Vmax):    0.79 cm AV Area (Vmean):   0.80 cm     3.06 cm2 AV Area (VTI):     0.84 cm AV Vmax:           374.50 cm/s AV Vmean:          241.250 cm/s 77 cm/s AV VTI:            0.719 m      3.15 cm2 AV Peak Grad:      56.1 mmHg AV Mean Grad:      28.5 mmHg    3 mmHg 12. The pulmonic valve was normal in structure. Pulmonic valve regurgitation is mild by color flow Doppler. 13. Moderately elevated pulmonary artery systolic pressure. 14. The inferior vena cava is dilated in size with <50% respiratory variability, suggesting right atrial pressure of 15 mmHg. (Comparison 01/13/18: LVEF  60-65%, no regional wall motion abnormalities, grade 1 diastolic dysfunction, leftward ventricular septal shift in systole suggestive of RV pressure overload, mild-moderate AS with mean gradient 22 mmHg, RVSP 52 mmHg, small pericardial effusion)   ZioXT long term monitor 05/31/18-06/14/18: Max 171 bpm 12:42am, 05/20 Min 66 bpm 06:32am, 05/25 Avg 90 bpm Rare PACs and PVCs 2 VT episodes, fasted 4 beats 171 BPM, longest 8 beats at 106 bpm 1 SVT episode lasting 8 beats Predominantly sinus rhythm Patient triggered events associated with sinus rhythm and sinus tachycardia rates 90-105   RHC 01/19/18: Conclusions: 1. Normal left heart filling pressure. 2. Mildly elevated right heart filling pressure. 3. 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 4. Normal cardiac output/index. Recommendations: 1. Medical therapy.   Past Medical History:  Diagnosis Date  . Anemia   . 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  [name] Cataract    right eye - surgery to remove  . CHF (congestive heart failure) ([place])   . Elevated PSA 11/29/2016   Patient is followed by [facility].  Most recent PSA near 11.  He has had atypia on biopsy.  November 2008  . Essential hypertension, benign 06/08/2012  . Full dentures   . GERD (gastroesophageal reflux disease)   .  GI bleed   . Glaucoma   . Heart murmur    since childhood, never has caused any problems  . History of rheumatic fever 11/04/2015  . Hyperlipidemia 04/25/2013  . Hypertension   . Malignant neoplasm of left lung ([place]) 02/08/2017  . RBBB 07/12/2017  . Sickle cell trait ([place])    no problems per patient    Past Surgical History:  Procedure Laterality Date  . BIOPSY  11/11/2017   Procedure: BIOPSY;  Surgeon: Lavena Bullion, DO;  Location: [place] ENDOSCOPY;  Service: Gastroenterology;;  . BIOPSY  01/10/2018   Procedure: BIOPSY;   Surgeon: [name], MD;  Location: [place];  Service: Gastroenterology;;  . BIOPSY  06/22/2018   Procedure: BIOPSY;  Surgeon: Yetta Flock, MD;  Location: [facility] ENDOSCOPY;  Service: Gastroenterology;;  . CATARACT EXTRACTION Right   . cataract eye surgery Right   . COLONOSCOPY  11/2009   hx polyps/[place]  . COLONOSCOPY N/A 06/22/2018   Procedure: COLONOSCOPY;  Surgeon: Yetta Flock, MD;  Location: [place];  Service: Gastroenterology;  Laterality: N/A;  . ENTEROSCOPY N/A 06/22/2018   Procedure: PUSH ENTEROSCOPY;  Surgeon: Yetta Flock, MD;  Location: [facility] ENDOSCOPY;  Service: Gastroenterology;  Laterality: N/A;  . ENTEROSCOPY N/A 11/12/2018   Procedure: ENTEROSCOPY;  Surgeon: Milus Banister, MD;  Location: [facility] ENDOSCOPY;  Service: Endoscopy;  Laterality: N/A;  . ESOPHAGOGASTRODUODENOSCOPY N/A 11/11/2017   Procedure: ESOPHAGOGASTRODUODENOSCOPY (EGD);  Surgeon: Lavena Bullion, DO;  Location: [facility] ENDOSCOPY;  Service: Gastroenterology;  Laterality: N/A;  . ESOPHAGOGASTRODUODENOSCOPY (EGD) WITH PROPOFOL N/A 01/10/2018   Procedure: ESOPHAGOGASTRODUODENOSCOPY (EGD) WITH PROPOFOL;  Surgeon: [name], MD;  Location: [place];  Service: Gastroenterology;  Laterality: N/A;  . GIVENS CAPSULE STUDY N/A 11/12/2018   Procedure: GIVENS CAPSULE STUDY;  Surgeon: Milus Banister, MD;  Location: [facility] ENDOSCOPY;  Service: Endoscopy;  Laterality: N/A;  . HEMOSTASIS CLIP PLACEMENT  11/12/2018   Procedure: HEMOSTASIS CLIP PLACEMENT;  Surgeon: Milus Banister, MD;  Location: [place];  Service: Endoscopy;;  . HOT HEMOSTASIS N/A 06/22/2018   Procedure: HOT HEMOSTASIS (ARGON PLASMA COAGULATION/BICAP);  Surgeon: Yetta Flock, MD;  Location: [facility] ENDOSCOPY;  Service: Gastroenterology;  Laterality: N/A;  . HOT HEMOSTASIS N/A 11/12/2018   Procedure: HOT HEMOSTASIS (ARGON PLASMA COAGULATION/BICAP);  Surgeon: Milus Banister, MD;  Location: [facility] ENDOSCOPY;  Service:  Endoscopy;  Laterality: N/A;  . IR FLUORO GUIDE CV LINE RIGHT  01/16/2018  . IR FLUORO GUIDE CV LINE RIGHT  01/31/2018  . IR FLUORO GUIDE CV LINE RIGHT  10/06/2018  . IR FLUORO GUIDE CV LINE RIGHT  10/11/2018  . IR US GUIDE VASC ACCESS RIGHT  01/16/2018  . IR US GUIDE VASC ACCESS RIGHT  01/31/2018  . IR US GUIDE VASC ACCESS RIGHT  10/06/2018  . POLYPECTOMY  06/22/2018   Procedure: POLYPECTOMY;  Surgeon: Yetta Flock, MD;  Location: [place];  Service: Gastroenterology;;  . PROSTATE BIOPSY    . right eye surgery     to lower eye pressure  . RIGHT HEART CATH N/A 01/19/2018   Procedure: RIGHT HEART CATH;  Surgeon: Nelva Bush, MD;  Location: [place] CV LAB;  Service: Cardiovascular;  Laterality: N/A;  . TONSILLECTOMY    . VIDEO ASSISTED THORACOSCOPY (VATS)/ LOBECTOMY Right 12/26/2015  . wisdom tteeth ext      MEDICATIONS: . 0.9 %  sodium chloride infusion  . 0.9 %  sodium chloride infusion  . alteplase (CATHFLO ACTIVASE) injection 2 mg  . heparin  injection 1,000 Units  . lidocaine (PF) (XYLOCAINE) 1 % injection 5 mL  . lidocaine-prilocaine (EMLA) cream 1 application  . pentafluoroprop-tetrafluoroeth (GEBAUERS) aerosol 1 application   . amLODipine (NORVASC) 5 MG tablet  . Brinzolamide-Brimonidine (SIMBRINZA) 1-0.2 % SUSP  . carvedilol (COREG) 3.125 MG tablet  . ferric citrate (AURYXIA) 1 GM 210 MG(Fe) tablet  . hydrALAZINE (APRESOLINE) 25 MG tablet  . Lifitegrast (XIIDRA) 5 % SOLN  . multivitamin (RENA-VIT) TABS tablet  . Netarsudil-Latanoprost (ROCKLATAN) 0.02-0.005 % SOLN  . Nutritional Supplements (FEEDING SUPPLEMENT, NEPRO CARB STEADY,) LIQD  . Octreotide Acetate (SANDOSTATIN IJ)  . pantoprazole (PROTONIX) 40 MG tablet  . sevelamer carbonate (RENVELA) 800 MG tablet    Myra Gianotti, PA-C Surgical Short Stay/Anesthesiology [facility] Phone [phone] [facility] Phone [phone] 01/24/2019 11:59 AM

## 2019-01-24 NOTE — Anesthesia Preprocedure Evaluation (Addendum)
Anesthesia Evaluation  Patient identified by MRN, date of birth, ID band Patient awake    Reviewed: Allergy & Precautions, NPO status , Patient's Chart, lab work & pertinent test results  Airway Mallampati: II  TM Distance: >3 FB Neck ROM: Full    Dental  (+) Edentulous Upper, Edentulous Lower   Pulmonary former smoker,     + decreased breath sounds      Cardiovascular hypertension,  Rhythm:Regular Rate:Normal     Neuro/Psych    GI/Hepatic   Endo/Other    Renal/GU      Musculoskeletal   Abdominal   Peds  Hematology   Anesthesia Other Findings   Reproductive/Obstetrics                             Anesthesia Physical Anesthesia Plan  ASA: III  Anesthesia Plan: MAC   Post-op Pain Management:    Induction: Intravenous  PONV Risk Score and Plan:   Airway Management Planned: Natural Airway and Nasal Cannula  Additional Equipment:   Intra-op Plan:   Post-operative Plan:   Informed Consent: I have reviewed the patients History and Physical, chart, labs and discussed the procedure including the risks, benefits and alternatives for the proposed anesthesia with the patient or authorized representative who has indicated his/her understanding and acceptance.       Plan Discussed with: CRNA and Anesthesiologist  Anesthesia Plan Comments: (PAT note written 01/24/2019 by Myra Gianotti, PA-C. )       Anesthesia Quick Evaluation

## 2019-01-25 ENCOUNTER — Encounter (HOSPITAL_COMMUNITY)
Admission: RE | Disposition: A | Discharge status: Home / Self Care | Payer: Self-pay | Source: Ambulatory Visit | Attending: Vascular Surgery

## 2019-01-25 ENCOUNTER — Encounter (HOSPITAL_COMMUNITY): Payer: Self-pay | Admitting: Vascular Surgery

## 2019-01-25 ENCOUNTER — Ambulatory Visit (HOSPITAL_COMMUNITY): Payer: Medicare PPO | Admitting: Vascular Surgery

## 2019-01-25 ENCOUNTER — Ambulatory Visit (HOSPITAL_COMMUNITY)
Admission: RE | Admit: 2019-01-25 | Discharge: 2019-01-25 | Disposition: A | Discharge status: Home / Self Care | Payer: Medicare PPO | Source: Ambulatory Visit | Attending: Vascular Surgery | Admitting: Vascular Surgery

## 2019-01-25 ENCOUNTER — Other Ambulatory Visit: Payer: Self-pay

## 2019-01-25 DIAGNOSIS — I132 Hypertensive heart and chronic kidney disease with heart failure and with stage 5 chronic kidney disease, or end stage renal disease: Secondary | ICD-10-CM | POA: Diagnosis not present

## 2019-01-25 DIAGNOSIS — E785 Hyperlipidemia, unspecified: Secondary | ICD-10-CM | POA: Insufficient documentation

## 2019-01-25 DIAGNOSIS — Z801 Family history of malignant neoplasm of trachea, bronchus and lung: Secondary | ICD-10-CM | POA: Diagnosis not present

## 2019-01-25 DIAGNOSIS — I082 Rheumatic disorders of both aortic and tricuspid valves: Secondary | ICD-10-CM | POA: Insufficient documentation

## 2019-01-25 DIAGNOSIS — Z833 Family history of diabetes mellitus: Secondary | ICD-10-CM | POA: Insufficient documentation

## 2019-01-25 DIAGNOSIS — Z87891 Personal history of nicotine dependence: Secondary | ICD-10-CM | POA: Diagnosis not present

## 2019-01-25 DIAGNOSIS — Z8249 Family history of ischemic heart disease and other diseases of the circulatory system: Secondary | ICD-10-CM | POA: Diagnosis not present

## 2019-01-25 DIAGNOSIS — I471 Supraventricular tachycardia: Secondary | ICD-10-CM | POA: Diagnosis not present

## 2019-01-25 DIAGNOSIS — D573 Sickle-cell trait: Secondary | ICD-10-CM | POA: Insufficient documentation

## 2019-01-25 DIAGNOSIS — E1122 Type 2 diabetes mellitus with diabetic chronic kidney disease: Secondary | ICD-10-CM | POA: Insufficient documentation

## 2019-01-25 DIAGNOSIS — Z888 Allergy status to other drugs, medicaments and biological substances status: Secondary | ICD-10-CM | POA: Insufficient documentation

## 2019-01-25 DIAGNOSIS — Z79899 Other long term (current) drug therapy: Secondary | ICD-10-CM | POA: Insufficient documentation

## 2019-01-25 DIAGNOSIS — Z992 Dependence on renal dialysis: Secondary | ICD-10-CM | POA: Diagnosis not present

## 2019-01-25 DIAGNOSIS — J90 Pleural effusion, not elsewhere classified: Secondary | ICD-10-CM | POA: Diagnosis not present

## 2019-01-25 DIAGNOSIS — N186 End stage renal disease: Secondary | ICD-10-CM | POA: Diagnosis not present

## 2019-01-25 DIAGNOSIS — H409 Unspecified glaucoma: Secondary | ICD-10-CM | POA: Diagnosis not present

## 2019-01-25 DIAGNOSIS — I493 Ventricular premature depolarization: Secondary | ICD-10-CM | POA: Insufficient documentation

## 2019-01-25 DIAGNOSIS — Z85118 Personal history of other malignant neoplasm of bronchus and lung: Secondary | ICD-10-CM | POA: Insufficient documentation

## 2019-01-25 DIAGNOSIS — R011 Cardiac murmur, unspecified: Secondary | ICD-10-CM | POA: Diagnosis not present

## 2019-01-25 DIAGNOSIS — N185 Chronic kidney disease, stage 5: Secondary | ICD-10-CM | POA: Diagnosis not present

## 2019-01-25 DIAGNOSIS — I509 Heart failure, unspecified: Secondary | ICD-10-CM | POA: Insufficient documentation

## 2019-01-25 DIAGNOSIS — I4510 Unspecified right bundle-branch block: Secondary | ICD-10-CM | POA: Insufficient documentation

## 2019-01-25 DIAGNOSIS — K219 Gastro-esophageal reflux disease without esophagitis: Secondary | ICD-10-CM | POA: Diagnosis not present

## 2019-01-25 DIAGNOSIS — I272 Pulmonary hypertension, unspecified: Secondary | ICD-10-CM | POA: Diagnosis not present

## 2019-01-25 DIAGNOSIS — I5033 Acute on chronic diastolic (congestive) heart failure: Secondary | ICD-10-CM | POA: Diagnosis not present

## 2019-01-25 HISTORY — DX: Gastro-esophageal reflux disease without esophagitis: K21.9

## 2019-01-25 HISTORY — PX: AV FISTULA PLACEMENT: SHX1204

## 2019-01-25 HISTORY — DX: Heart failure, unspecified: I50.9

## 2019-01-25 LAB — POCT I-STAT, CHEM 8
BUN: 32 mg/dL — ABNORMAL HIGH (ref 8–23)
Calcium, Ion: 1.08 mmol/L — ABNORMAL LOW (ref 1.15–1.40)
Chloride: 96 mmol/L — ABNORMAL LOW (ref 98–111)
Creatinine, Ser: 4.8 mg/dL — ABNORMAL HIGH (ref 0.61–1.24)
Glucose, Bld: 91 mg/dL (ref 70–99)
HCT: 38 % — ABNORMAL LOW (ref 39.0–52.0)
Hemoglobin: 12.9 g/dL — ABNORMAL LOW (ref 13.0–17.0)
Potassium: 4.1 mmol/L (ref 3.5–5.1)
Sodium: 138 mmol/L (ref 135–145)
TCO2: 33 mmol/L — ABNORMAL HIGH (ref 22–32)

## 2019-01-25 SURGERY — ARTERIOVENOUS (AV) FISTULA CREATION
Anesthesia: Monitor Anesthesia Care | Site: Arm Lower | Laterality: Right

## 2019-01-25 MED ORDER — OXYCODONE HCL 5 MG/5ML PO SOLN: 5.0000 mg | Freq: Once | ORAL | Status: DC | PRN

## 2019-01-25 MED ORDER — FENTANYL CITRATE (PF) 100 MCG/2ML IJ SOLN
INTRAMUSCULAR | Status: DC | PRN
  Administered 2019-01-25 (×2): 25 ug via INTRAVENOUS

## 2019-01-25 MED ORDER — SODIUM CHLORIDE 0.9 % IV SOLN
INTRAVENOUS | Status: AC
  Filled 2019-01-25: qty 1.2

## 2019-01-25 MED ORDER — SODIUM CHLORIDE 0.9 % IV SOLN: INTRAVENOUS | Status: DC | PRN

## 2019-01-25 MED ORDER — FENTANYL CITRATE (PF) 250 MCG/5ML IJ SOLN
INTRAMUSCULAR | Status: AC
  Filled 2019-01-25: qty 5

## 2019-01-25 MED ORDER — CHLORHEXIDINE GLUCONATE 4 % EX LIQD: 60.0000 mL | Freq: Once | CUTANEOUS | Status: DC

## 2019-01-25 MED ORDER — LIDOCAINE HCL (CARDIAC) PF 100 MG/5ML IV SOSY
PREFILLED_SYRINGE | INTRAVENOUS | Status: DC | PRN
  Administered 2019-01-25: 20 mg via INTRATRACHEAL

## 2019-01-25 MED ORDER — PROPOFOL 10 MG/ML IV BOLUS
INTRAVENOUS | Status: DC | PRN
  Administered 2019-01-25 (×2): 10 mg via INTRAVENOUS

## 2019-01-25 MED ORDER — FENTANYL CITRATE (PF) 100 MCG/2ML IJ SOLN: 25.0000 ug | INTRAMUSCULAR | Status: DC | PRN

## 2019-01-25 MED ORDER — LIDOCAINE-EPINEPHRINE 0.5 %-1:200000 IJ SOLN
INTRAMUSCULAR | Status: AC
  Filled 2019-01-25: qty 1

## 2019-01-25 MED ORDER — SODIUM CHLORIDE 0.9 % IV SOLN: INTRAVENOUS | Status: DC

## 2019-01-25 MED ORDER — SODIUM CHLORIDE 0.9 % IV SOLN
INTRAVENOUS | Status: DC | PRN
  Administered 2019-01-25: 500 mL

## 2019-01-25 MED ORDER — PHENYLEPHRINE HCL-NACL 10-0.9 MG/250ML-% IV SOLN
INTRAVENOUS | Status: DC | PRN
  Administered 2019-01-25: 15 ug/min via INTRAVENOUS

## 2019-01-25 MED ORDER — OXYCODONE HCL 5 MG PO TABS: 5.0000 mg | ORAL_TABLET | Freq: Once | ORAL | Status: DC | PRN

## 2019-01-25 MED ORDER — PROPOFOL 10 MG/ML IV BOLUS
INTRAVENOUS | Status: AC
  Filled 2019-01-25: qty 20

## 2019-01-25 MED ORDER — ONDANSETRON HCL 4 MG/2ML IJ SOLN: 4.0000 mg | Freq: Once | INTRAMUSCULAR | Status: DC | PRN

## 2019-01-25 MED ORDER — PROPOFOL 500 MG/50ML IV EMUL
INTRAVENOUS | Status: DC | PRN
  Administered 2019-01-25: 50 ug/kg/min via INTRAVENOUS

## 2019-01-25 MED ORDER — LIDOCAINE-EPINEPHRINE 0.5 %-1:200000 IJ SOLN
INTRAMUSCULAR | Status: DC | PRN
  Administered 2019-01-25: 6 mL

## 2019-01-25 MED ORDER — CEFAZOLIN SODIUM-DEXTROSE 2-4 GM/100ML-% IV SOLN
INTRAVENOUS | Status: AC
  Filled 2019-01-25: qty 100

## 2019-01-25 MED ORDER — OXYCODONE-ACETAMINOPHEN 5-325 MG PO TABS: 1.0000 | ORAL_TABLET | Freq: Four times a day (QID) | ORAL | 0 refills | Status: DC | PRN

## 2019-01-25 MED ORDER — CEFAZOLIN SODIUM-DEXTROSE 2-4 GM/100ML-% IV SOLN
2.0000 g | INTRAVENOUS | Status: AC
  Administered 2019-01-25: 2 g via INTRAVENOUS

## 2019-01-25 MED ORDER — 0.9 % SODIUM CHLORIDE (POUR BTL) OPTIME
TOPICAL | Status: DC | PRN
  Administered 2019-01-25: 1000 mL

## 2019-01-25 SURGICAL SUPPLY — 28 items
ADH SKN CLS APL DERMABOND .7 (GAUZE/BANDAGES/DRESSINGS) ×1
ARMBAND PINK RESTRICT EXTREMIT (MISCELLANEOUS) ×6 IMPLANT
CANISTER SUCT 3000ML PPV (MISCELLANEOUS) ×3 IMPLANT
CANNULA VESSEL 3MM 2 BLNT TIP (CANNULA) ×3 IMPLANT
CLIP LIGATING EXTRA MED SLVR (CLIP) ×3 IMPLANT
CLIP LIGATING EXTRA SM BLUE (MISCELLANEOUS) ×3 IMPLANT
COVER PROBE W GEL 5X96 (DRAPES) ×1 IMPLANT
COVER WAND RF STERILE (DRAPES) ×1 IMPLANT
DECANTER SPIKE VIAL GLASS SM (MISCELLANEOUS) ×3 IMPLANT
DERMABOND ADVANCED (GAUZE/BANDAGES/DRESSINGS) ×2
DERMABOND ADVANCED .7 DNX12 (GAUZE/BANDAGES/DRESSINGS) ×1 IMPLANT
ELECT REM PT RETURN 9FT ADLT (ELECTROSURGICAL) ×3
ELECTRODE REM PT RTRN 9FT ADLT (ELECTROSURGICAL) ×1 IMPLANT
GLOVE SS BIOGEL STRL SZ 7.5 (GLOVE) ×1 IMPLANT
GLOVE SUPERSENSE BIOGEL SZ 7.5 (GLOVE) ×2
GOWN STRL REUS W/ TWL LRG LVL3 (GOWN DISPOSABLE) ×3 IMPLANT
GOWN STRL REUS W/TWL LRG LVL3 (GOWN DISPOSABLE) ×9
KIT BASIN OR (CUSTOM PROCEDURE TRAY) ×3 IMPLANT
KIT TURNOVER KIT B (KITS) ×3 IMPLANT
NS IRRIG 1000ML POUR BTL (IV SOLUTION) ×3 IMPLANT
PACK CV ACCESS (CUSTOM PROCEDURE TRAY) ×3 IMPLANT
PAD ARMBOARD 7.5X6 YLW CONV (MISCELLANEOUS) ×6 IMPLANT
SUT PROLENE 6 0 CC (SUTURE) ×3 IMPLANT
SUT VIC AB 3-0 SH 27 (SUTURE) ×3
SUT VIC AB 3-0 SH 27X BRD (SUTURE) ×1 IMPLANT
TOWEL GREEN STERILE (TOWEL DISPOSABLE) ×3 IMPLANT
UNDERPAD 30X30 (UNDERPADS AND DIAPERS) ×3 IMPLANT
WATER STERILE IRR 1000ML POUR (IV SOLUTION) ×3 IMPLANT

## 2019-01-25 NOTE — Transfer of Care (Signed)
Immediate Anesthesia Transfer of Care Note  Patient: GARVIS DOWNUM  Procedure(s) Performed: ARTERIOVENOUS (AV) FISTULA CREATION RIGHT ARM (Right Arm Lower)  Patient Location: PACU  Anesthesia Type:MAC  Level of Consciousness: awake, alert  and oriented  Airway & Oxygen Therapy: Patient Spontanous Breathing and Patient connected to nasal cannula oxygen  Post-op Assessment: Report given to RN, Post -op Vital signs reviewed and stable and Patient moving all extremities X 4  Post vital signs: Reviewed and stable  Last Vitals:  Vitals Value Taken Time  BP 127/63 01/25/19 1039  Temp    Pulse 74 01/25/19 1040  Resp 19 01/25/19 1040  SpO2 100 % 01/25/19 1040  Vitals shown include unvalidated device data.  Last Pain:  Vitals:   01/25/19 0719  PainSc: 0-No pain      Patients Stated Pain Goal: 3 (11/94/17 4081)  Complications: No apparent anesthesia complications

## 2019-01-25 NOTE — Op Note (Signed)
    OPERATIVE REPORT  DATE OF SURGERY: 01/25/2019  PATIENT: Andrew Guerra, 79 y.o. male MRN: [id number]  DOB: Nov 03, 1940  PRE-OPERATIVE DIAGNOSIS: End-stage renal disease  POST-OPERATIVE DIAGNOSIS:  Same  PROCEDURE: Right radiocephalic AV fistula creation  SURGEON:  Curt Jews, M.D.  PHYSICIAN ASSISTANT: Laurence Slate, PA-C  ANESTHESIA: Local with sedation  EBL: per anesthesia record  Total I/O In: 539.4 [I.V.:439.4; IV Piggyback:100] Out: 5 [Blood:5]  BLOOD ADMINISTERED: none  DRAINS: none  SPECIMEN: none  COUNTS CORRECT:  YES  PATIENT DISPOSITION:  PACU - hemodynamically stable  PROCEDURE DETAILS: Patient was taken operating placed supine position where the area of the right arm prepped draped in sterile fashion.  Using local anesthesia incision was made between the level of the radial artery and the cephalic vein at the wrist.  The cephalic vein was of good caliber.  Tributary branches were ligated and divided.  The vein was ligated distally and mobilized to the level of the radial artery.  The radial artery was occluded proximally distally was opened with 11 blade and sent longstanding with Potts scissors.  The vein was cut to the appropriate length and was spatulated and sewn end-to-side to the artery with a running 6-0 Prolene suture.  A 2 dilator passed through the anastomosis prior to completion.  Anastomosis completed and clamps removed with excellent thrill noted.  The wounds irrigated with saline.  Hemostasis electrocautery.  Wounds were closed with 3-0 Vicryl in the subcutaneous subcuticular tissue.  Sterile dressing was applied and the patient was transferred to the recovery room stable condition   Rosetta Posner, M.D., [facility] 01/25/2019 10:40 AM

## 2019-01-25 NOTE — Discharge Instructions (Signed)
° °  [facility] ° °Discharge Instructions ° °AV Fistula or Graft Surgery for Dialysis Access ° °Please refer to the following instructions for your post-procedure care. Your surgeon or physician assistant will discuss any changes with you. ° °Activity ° °You may drive the day following your surgery, if you are comfortable and no longer taking prescription pain medication. Resume full activity as the soreness in your incision resolves. ° °Bathing/Showering ° °You may shower after you go home. Keep your incision dry for 48 hours. Do not soak in a bathtub, hot tub, or swim until the incision heals completely. You may not shower if you have a hemodialysis catheter. ° °Incision Care ° °Clean your incision with mild soap and water after 48 hours. Pat the area dry with a clean towel. You do not need a bandage unless otherwise instructed. Do not apply any ointments or creams to your incision. You may have skin glue on your incision. Do not peel it off. It will come off on its own in about one week. Your arm may swell a bit after surgery. To reduce swelling use pillows to elevate your arm so it is above your heart. Your doctor will tell you if you need to lightly wrap your arm with an ACE bandage. ° °Diet ° °Resume your normal diet. There are not special food restrictions following this procedure. In order to heal from your surgery, it is CRITICAL to get adequate nutrition. Your body requires vitamins, minerals, and protein. Vegetables are the best source of vitamins and minerals. Vegetables also provide the perfect balance of protein. Processed food has little nutritional value, so try to avoid this. ° °Medications ° °Resume taking all of your medications. If your incision is causing pain, you may take over-the counter pain relievers such as acetaminophen (Tylenol). If you were prescribed a stronger pain medication, please be aware these medications can cause nausea and constipation. Prevent  nausea by taking the medication with a snack or meal. Avoid constipation by drinking plenty of fluids and eating foods with high amount of fiber, such as fruits, vegetables, and grains. Do not take Tylenol if you are taking prescription pain medications. ° ° ° ° °Follow up °Your surgeon may want to see you in the office following your access surgery. If so, this will be arranged at the time of your surgery. ° °Please call us immediately for any of the following conditions: ° °Increased pain, redness, drainage (pus) from your incision site °Fever of 101 degrees or higher °Severe or worsening pain at your incision site °Hand pain or numbness. ° °Reduce your risk of vascular disease: ° °Stop smoking. If you would like help, call [facility] at 1-800-QUIT-NOW (1-800-784-8669) or [facility] at 336-586-4000 ° °Manage your cholesterol °Maintain a desired weight °Control your diabetes °Keep your blood pressure down ° °Dialysis ° °It will take several weeks to several months for your new dialysis access to be ready for use. Your surgeon will determine when it is OK to use it. Your nephrologist will continue to direct your dialysis. You can continue to use your Permcath until your new access is ready for use. ° °If you have any questions, please call the office at [phone]. ° °

## 2019-01-25 NOTE — Anesthesia Postprocedure Evaluation (Signed)
Anesthesia Post Note  Patient: Andrew Guerra  Procedure(s) Performed: ARTERIOVENOUS (AV) FISTULA CREATION RIGHT ARM (Right Arm Lower)     Patient location during evaluation: PACU Anesthesia Type: MAC Level of consciousness: awake and alert Pain management: pain level controlled Vital Signs Assessment: post-procedure vital signs reviewed and stable Respiratory status: spontaneous breathing, nonlabored ventilation, respiratory function stable and patient connected to nasal cannula oxygen Cardiovascular status: stable and blood pressure returned to baseline Postop Assessment: no apparent nausea or vomiting Anesthetic complications: no    Last Vitals:  Vitals:   01/25/19 1105 01/25/19 1110  BP: (!) 130/58   Pulse: 72   Resp: 15   Temp: 36.6 C   SpO2: 99% 99%    Last Pain:  Vitals:   01/25/19 1110  PainSc: 0-No pain                 Thena Devora COKER

## 2019-01-25 NOTE — Anesthesia Procedure Notes (Signed)
Procedure Name: MAC Date/Time: 01/25/2019 9:30 AM Performed by: Neldon Newport, CRNA Pre-anesthesia Checklist: Timeout performed, Patient being monitored, Suction available, Emergency Drugs available and Patient identified Patient Re-evaluated:Patient Re-evaluated prior to induction Oxygen Delivery Method: Simple face mask Placement Confirmation: positive ETCO2

## 2019-01-25 NOTE — Interval H&P Note (Signed)
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  The various methods of treatment have been discussed with the patient and family. After consideration of risks, benefits and other options for treatment, the patient has consented to  Procedure(s): ARTERIOVENOUS (AV) FISTULA CREATION VERSUS INSERTION OF ARTERIOVENOUS GRAFT RIGHT ARM (Right) as a surgical intervention.  The patient's history has been reviewed, patient examined, no change in status, stable for surgery.  I have reviewed the patient's chart and labs.  Questions were answered to the patient's satisfaction.     Curt Jews

## 2019-01-26 DIAGNOSIS — Z992 Dependence on renal dialysis: Secondary | ICD-10-CM | POA: Diagnosis not present

## 2019-01-26 DIAGNOSIS — N186 End stage renal disease: Secondary | ICD-10-CM | POA: Diagnosis not present

## 2019-01-26 DIAGNOSIS — N2581 Secondary hyperparathyroidism of renal origin: Secondary | ICD-10-CM | POA: Diagnosis not present

## 2019-01-29 DIAGNOSIS — N2581 Secondary hyperparathyroidism of renal origin: Secondary | ICD-10-CM | POA: Diagnosis not present

## 2019-01-29 DIAGNOSIS — N186 End stage renal disease: Secondary | ICD-10-CM | POA: Diagnosis not present

## 2019-01-29 DIAGNOSIS — Z992 Dependence on renal dialysis: Secondary | ICD-10-CM | POA: Diagnosis not present

## 2019-01-31 ENCOUNTER — Other Ambulatory Visit: Payer: Self-pay | Admitting: *Deleted

## 2019-01-31 DIAGNOSIS — N2581 Secondary hyperparathyroidism of renal origin: Secondary | ICD-10-CM | POA: Diagnosis not present

## 2019-01-31 DIAGNOSIS — N186 End stage renal disease: Secondary | ICD-10-CM | POA: Diagnosis not present

## 2019-01-31 DIAGNOSIS — D62 Acute posthemorrhagic anemia: Secondary | ICD-10-CM

## 2019-01-31 DIAGNOSIS — Z992 Dependence on renal dialysis: Secondary | ICD-10-CM | POA: Diagnosis not present

## 2019-01-31 NOTE — Telephone Encounter (Signed)
Janett Billow Dr Tarri Glenn has already answered and Francetta Found is taking care of it.  Thank you

## 2019-02-02 DIAGNOSIS — Z992 Dependence on renal dialysis: Secondary | ICD-10-CM | POA: Diagnosis not present

## 2019-02-02 DIAGNOSIS — N186 End stage renal disease: Secondary | ICD-10-CM | POA: Diagnosis not present

## 2019-02-02 DIAGNOSIS — N2581 Secondary hyperparathyroidism of renal origin: Secondary | ICD-10-CM | POA: Diagnosis not present

## 2019-02-05 ENCOUNTER — Telehealth: Payer: Self-pay | Admitting: *Deleted

## 2019-02-05 DIAGNOSIS — N186 End stage renal disease: Secondary | ICD-10-CM | POA: Diagnosis not present

## 2019-02-05 DIAGNOSIS — N2581 Secondary hyperparathyroidism of renal origin: Secondary | ICD-10-CM | POA: Diagnosis not present

## 2019-02-05 DIAGNOSIS — Z992 Dependence on renal dialysis: Secondary | ICD-10-CM | POA: Diagnosis not present

## 2019-02-05 NOTE — Telephone Encounter (Signed)
Sandostatin injection scheduled on 02/09/19 at 8 am.   Patient notified via [place].

## 2019-02-07 DIAGNOSIS — N2581 Secondary hyperparathyroidism of renal origin: Secondary | ICD-10-CM | POA: Diagnosis not present

## 2019-02-07 DIAGNOSIS — Z992 Dependence on renal dialysis: Secondary | ICD-10-CM | POA: Diagnosis not present

## 2019-02-07 DIAGNOSIS — N186 End stage renal disease: Secondary | ICD-10-CM | POA: Diagnosis not present

## 2019-02-09 ENCOUNTER — Ambulatory Visit (HOSPITAL_COMMUNITY)
Admission: RE | Admit: 2019-02-09 | Discharge: 2019-02-09 | Disposition: A | Discharge status: Home / Self Care | Payer: Medicare PPO | Source: Ambulatory Visit | Attending: Internal Medicine | Admitting: Internal Medicine

## 2019-02-09 ENCOUNTER — Other Ambulatory Visit: Payer: Self-pay

## 2019-02-09 DIAGNOSIS — Z992 Dependence on renal dialysis: Secondary | ICD-10-CM | POA: Diagnosis not present

## 2019-02-09 DIAGNOSIS — D62 Acute posthemorrhagic anemia: Secondary | ICD-10-CM | POA: Diagnosis not present

## 2019-02-09 DIAGNOSIS — N186 End stage renal disease: Secondary | ICD-10-CM | POA: Diagnosis not present

## 2019-02-09 DIAGNOSIS — N2581 Secondary hyperparathyroidism of renal origin: Secondary | ICD-10-CM | POA: Diagnosis not present

## 2019-02-09 MED ORDER — OCTREOTIDE ACETATE 20 MG IM KIT
20.0000 mg | PACK | INTRAMUSCULAR | Status: DC
  Administered 2019-02-09: 20 mg via INTRAMUSCULAR
  Filled 2019-02-09: qty 1

## 2019-02-09 NOTE — Progress Notes (Signed)
PATIENT CARE CENTER NOTE  Diagnosis: Acute blood loss anemia   Provider: [name] MD   Procedure: IM Sandostatin (Octreotide)   Note: Patient received IM Sandostatin injection in the left hip. Tolerated well. Discharge instructions given. Patient alert, oriented and ambulatory on discharge.

## 2019-02-09 NOTE — Discharge Instructions (Signed)
Octreotide injection solution What is this medicine? OCTREOTIDE (ok TREE oh tide) is used to reduce blood levels of growth hormone in patients with a condition called acromegaly. This medicine also reduces flushing and watery diarrhea caused by certain types of cancer. This medicine may be used for other purposes; ask your health care provider or pharmacist if you have questions. COMMON BRAND NAME(S): Bynfezia, Sandostatin What should I tell my health care provider before I take this medicine? They need to know if you have any of these conditions:  diabetes  gallbladder disease  kidney disease  liver disease  thyroid disease  an unusual or allergic reaction to octreotide, other medicines, foods, dyes, or preservatives  pregnant or trying to get pregnant  breast-feeding How should I use this medicine? This medicine is for injection under the skin or into a vein (only in emergency situations). It is usually given by a health care professional in a hospital or clinic setting. If you get this medicine at home, you will be taught how to prepare and give this medicine. Allow the injection solution to come to room temperature before use. Do not warm it artificially. Use exactly as directed. Take your medicine at regular intervals. Do not take your medicine more often than directed. It is important that you put your used needles and syringes in a special sharps container. Do not put them in a trash can. If you do not have a sharps container, call your pharmacist or healthcare provider to get one. Talk to your pediatrician regarding the use of this medicine in children. Special care may be needed. Overdosage: If you think you have taken too much of this medicine contact a poison control center or emergency room at once. NOTE: This medicine is only for you. Do not share this medicine with others. What if I miss a dose? If you miss a dose, take it as soon as you can. If it is almost time for your  next dose, take only that dose. Do not take double or extra doses. What may interact with this medicine?  bromocriptine  certain medicines for blood pressure, heart disease, irregular heartbeat  cyclosporine  diuretics  medicines for diabetes, including insulin  quinidine This list may not describe all possible interactions. Give your health care provider a list of all the medicines, herbs, non-prescription drugs, or dietary supplements you use. Also tell them if you smoke, drink alcohol, or use illegal drugs. Some items may interact with your medicine. What should I watch for while using this medicine? Visit your doctor or health care professional for regular checks on your progress. To help reduce irritation at the injection site, use a different site for each injection and make sure the solution is at room temperature before use. This medicine may cause decreases in blood sugar. Signs of low blood sugar include chills, cool, pale skin or cold sweats, drowsiness, extreme hunger, fast heartbeat, headache, nausea, nervousness or anxiety, shakiness, trembling, unsteadiness, tiredness, or weakness. Contact your doctor or health care professional right away if you experience any of these symptoms. This medicine may increase blood sugar. Ask your healthcare provider if changes in diet or medicines are needed if you have diabetes. This medicine may cause a decrease in vitamin B12. You should make sure that you get enough vitamin B12 while you are taking this medicine. Discuss the foods you eat and the vitamins you take with your health care professional. What side effects may I notice from receiving this medicine? Side   effects that you should report to your doctor or health care professional as soon as possible:  allergic reactions like skin rash, itching or hives, swelling of the face, lips, or tongue  fast, slow, or irregular heartbeat  right upper belly pain  severe stomach pain  signs  and symptoms of high blood sugar such as being more thirsty or hungry or having to urinate more than normal. You may also feel very tired or have blurry vision.  signs and symptoms of low blood sugar such as feeling anxious; confusion; dizziness; increased hunger; unusually weak or tired; increased sweating; shakiness; cold, clammy skin; irritable; headache; blurred vision; fast heartbeat; loss of consciousness  unusually weak or tired Side effects that usually do not require medical attention (report to your doctor or health care professional if they continue or are bothersome):  diarrhea  dizziness  gas  headache  nausea, vomiting  pain, redness, or irritation at site where injected  upset stomach This list may not describe all possible side effects. Call your doctor for medical advice about side effects. You may report side effects to FDA at 1-800-FDA-1088. Where should I keep my medicine? Keep out of the reach of children. Store in a refrigerator between 2 and 8 degrees C (36 and 46 degrees F). Protect from light. Allow to come to room temperature naturally. Do not use artificial heat. If protected from light, the injection may be stored at room temperature between 20 and 30 degrees C (70 and 86 degrees F) for 14 days. After the initial use, throw away any unused portion of a multiple dose vial after 14 days. Throw away unused portions of the ampules after use. NOTE: This sheet is a summary. It may not cover all possible information. If you have questions about this medicine, talk to your doctor, pharmacist, or health care provider.  2020 Elsevier/Gold Standard (2018-08-03 13:33:09)  

## 2019-02-12 DIAGNOSIS — Z992 Dependence on renal dialysis: Secondary | ICD-10-CM | POA: Diagnosis not present

## 2019-02-12 DIAGNOSIS — N2581 Secondary hyperparathyroidism of renal origin: Secondary | ICD-10-CM | POA: Diagnosis not present

## 2019-02-12 DIAGNOSIS — N186 End stage renal disease: Secondary | ICD-10-CM | POA: Diagnosis not present

## 2019-02-14 DIAGNOSIS — N186 End stage renal disease: Secondary | ICD-10-CM | POA: Diagnosis not present

## 2019-02-14 DIAGNOSIS — Z992 Dependence on renal dialysis: Secondary | ICD-10-CM | POA: Diagnosis not present

## 2019-02-14 DIAGNOSIS — N2581 Secondary hyperparathyroidism of renal origin: Secondary | ICD-10-CM | POA: Diagnosis not present

## 2019-02-15 DIAGNOSIS — C3492 Malignant neoplasm of unspecified part of left bronchus or lung: Secondary | ICD-10-CM | POA: Diagnosis not present

## 2019-02-15 DIAGNOSIS — R0600 Dyspnea, unspecified: Secondary | ICD-10-CM | POA: Diagnosis not present

## 2019-02-15 DIAGNOSIS — I5033 Acute on chronic diastolic (congestive) heart failure: Secondary | ICD-10-CM | POA: Diagnosis not present

## 2019-02-16 DIAGNOSIS — N2581 Secondary hyperparathyroidism of renal origin: Secondary | ICD-10-CM | POA: Diagnosis not present

## 2019-02-16 DIAGNOSIS — Z992 Dependence on renal dialysis: Secondary | ICD-10-CM | POA: Diagnosis not present

## 2019-02-16 DIAGNOSIS — N186 End stage renal disease: Secondary | ICD-10-CM | POA: Diagnosis not present

## 2019-02-18 DIAGNOSIS — N032 Chronic nephritic syndrome with diffuse membranous glomerulonephritis: Secondary | ICD-10-CM | POA: Diagnosis not present

## 2019-02-18 DIAGNOSIS — Z992 Dependence on renal dialysis: Secondary | ICD-10-CM | POA: Diagnosis not present

## 2019-02-18 DIAGNOSIS — N186 End stage renal disease: Secondary | ICD-10-CM | POA: Diagnosis not present

## 2019-02-19 DIAGNOSIS — N2581 Secondary hyperparathyroidism of renal origin: Secondary | ICD-10-CM | POA: Diagnosis not present

## 2019-02-19 DIAGNOSIS — Z992 Dependence on renal dialysis: Secondary | ICD-10-CM | POA: Diagnosis not present

## 2019-02-19 DIAGNOSIS — N186 End stage renal disease: Secondary | ICD-10-CM | POA: Diagnosis not present

## 2019-02-21 DIAGNOSIS — N186 End stage renal disease: Secondary | ICD-10-CM | POA: Diagnosis not present

## 2019-02-21 DIAGNOSIS — Z992 Dependence on renal dialysis: Secondary | ICD-10-CM | POA: Diagnosis not present

## 2019-02-21 DIAGNOSIS — N2581 Secondary hyperparathyroidism of renal origin: Secondary | ICD-10-CM | POA: Diagnosis not present

## 2019-02-23 DIAGNOSIS — N186 End stage renal disease: Secondary | ICD-10-CM | POA: Diagnosis not present

## 2019-02-23 DIAGNOSIS — N2581 Secondary hyperparathyroidism of renal origin: Secondary | ICD-10-CM | POA: Diagnosis not present

## 2019-02-23 DIAGNOSIS — Z992 Dependence on renal dialysis: Secondary | ICD-10-CM | POA: Diagnosis not present

## 2019-02-26 DIAGNOSIS — N2581 Secondary hyperparathyroidism of renal origin: Secondary | ICD-10-CM | POA: Diagnosis not present

## 2019-02-26 DIAGNOSIS — N186 End stage renal disease: Secondary | ICD-10-CM | POA: Diagnosis not present

## 2019-02-26 DIAGNOSIS — Z992 Dependence on renal dialysis: Secondary | ICD-10-CM | POA: Diagnosis not present

## 2019-02-27 ENCOUNTER — Other Ambulatory Visit: Payer: Self-pay

## 2019-02-27 ENCOUNTER — Ambulatory Visit (INDEPENDENT_AMBULATORY_CARE_PROVIDER_SITE_OTHER): Payer: Self-pay | Admitting: Physician Assistant

## 2019-02-27 ENCOUNTER — Ambulatory Visit (HOSPITAL_COMMUNITY)
Admission: RE | Admit: 2019-02-27 | Discharge: 2019-02-27 | Disposition: A | Discharge status: Home / Self Care | Payer: Medicare PPO | Source: Ambulatory Visit | Attending: Vascular Surgery | Admitting: Vascular Surgery

## 2019-02-27 VITALS — BP 152/78 | HR 76 | Temp 98.7°F | Ht 70.0 in | Wt 161.0 lb

## 2019-02-27 DIAGNOSIS — N186 End stage renal disease: Secondary | ICD-10-CM

## 2019-02-27 DIAGNOSIS — Z992 Dependence on renal dialysis: Secondary | ICD-10-CM

## 2019-02-27 NOTE — Progress Notes (Signed)
POST OPERATIVE OFFICE NOTE    CC:  F/u for surgery; 4+ weeks post-op  HPI:  This is a 79 y.o. male who is s/p right radiocephalic arteriovenous fistula creation January 25, 2019 by Dr. Donnetta Hutching.  He denies hand pain, numbness or tingling.  He states they are having some difficulty with his catheter, however he has been able to play treatments.  He currently dialyzes via right IJ tunneled dialysis catheter HD clinic/days: [place] /MWF  Allergies  Allergen Reactions  . Ambien [Zolpidem Tartrate] Other (See Comments)    Has hallucinations and Homicidal Ideations per Wife   . Lipitor [Atorvastatin] Other (See Comments)    Achy and tired    Current Outpatient Medications  Medication Sig Dispense Refill  . amLODipine (NORVASC) 5 MG tablet Take 1 tablet (5 mg total) by mouth daily. (Patient taking differently: Take 5 mg by mouth at bedtime. ) 30 tablet 0  . Brinzolamide-Brimonidine (SIMBRINZA) 1-0.2 % SUSP Place 2-3 drops into both eyes 3 (three) times daily as needed (eye pressure).     . carvedilol (COREG) 3.125 MG tablet Take 1 tablet (3.125 mg total) by mouth 2 (two) times daily with a meal. 60 tablet 0  . ferric citrate (AURYXIA) 1 GM 210 MG(Fe) tablet Take 210 mg by mouth 5 (five) times daily. Take 1 tablet (210 mg) by mouth with meals and snacks    . hydrALAZINE (APRESOLINE) 25 MG tablet Take 1 tablet (25 mg total) by mouth every 8 (eight) hours. 90 tablet 0  . Lifitegrast (XIIDRA) 5 % SOLN Place 1 drop into both eyes 2 (two) times daily as needed (dry eyes).    . multivitamin (RENA-VIT) TABS tablet Take 1 tablet by mouth daily.    . Netarsudil-Latanoprost (ROCKLATAN) 0.02-0.005 % SOLN Place 1 drop into both eyes at bedtime.     . Nutritional Supplements (FEEDING SUPPLEMENT, NEPRO CARB STEADY,) LIQD Take 237 mLs by mouth 3 (three) times daily.     . Octreotide Acetate (SANDOSTATIN IJ) Inject 1 Dose as directed every 30 (thirty) days.     [name] oxyCODONE-acetaminophen  (PERCOCET/ROXICET) 5-325 MG tablet Take 1 tablet by mouth every 6 (six) hours as needed. 8 tablet 0  . pantoprazole (PROTONIX) 40 MG tablet TAKE 1 TABLET BY MOUTH TWICE A DAY (Patient taking differently: Take 40 mg by mouth 2 (two) times daily. ) 180 tablet 1  . sevelamer carbonate (RENVELA) 800 MG tablet Take 800 mg by mouth 5 (five) times daily. Take 1 tablet (800 mg) by mouth with meals and snacks     No current facility-administered medications for this visit.     ROS:  See HPI  Physical Exam:  Vitals:   02/27/19 1400  Weight: 161 lb (73 kg)  Height: 5\' 10"  (1.778 m)   Incision: Healing nicely without signs of infection Extremities: Good bruit and thrill in fistula.  It is palpable along the forearm.  5/5 right grip strength Neuro: Alert and oriented x4 OUTFLOW VEINPSV (cm/s)Diameter (cm)Depth (cm)  Describe    +------------+----------+-------------+----------+----------------+  AC Fossa    110    0.39     0.18  competing branch  +------------+----------+-------------+----------+----------------+  Prox Forearm  182    0.55     0.26            +------------+----------+-------------+----------+----------------+  Mid Forearm   138    0.50     0.19  competing branch  +------------+----------+-------------+----------+----------------+  Dist Forearm  346    0.62  0.33              Assessment/Plan:  This is a 79 y.o. male who is s/p: Right radiocephalic AV fistula creation.  He is 4+ weeks out.  His fistula is developing nicely.  May access fistula for HD treatments April 19, 2019   -Follow-up as needed   Barbie Banner, PA-C [facility] [phone]  Clinic MD:  Carlis Abbott

## 2019-02-28 DIAGNOSIS — N2581 Secondary hyperparathyroidism of renal origin: Secondary | ICD-10-CM | POA: Diagnosis not present

## 2019-02-28 DIAGNOSIS — N186 End stage renal disease: Secondary | ICD-10-CM | POA: Diagnosis not present

## 2019-02-28 DIAGNOSIS — Z992 Dependence on renal dialysis: Secondary | ICD-10-CM | POA: Diagnosis not present

## 2019-03-01 ENCOUNTER — Encounter: Payer: Self-pay | Admitting: Gastroenterology

## 2019-03-01 ENCOUNTER — Ambulatory Visit: Payer: Medicare PPO | Admitting: Gastroenterology

## 2019-03-01 ENCOUNTER — Other Ambulatory Visit: Payer: Self-pay

## 2019-03-01 VITALS — BP 118/62 | HR 80 | Temp 97.2°F | Ht 70.0 in | Wt 163.0 lb

## 2019-03-01 DIAGNOSIS — D62 Acute posthemorrhagic anemia: Secondary | ICD-10-CM | POA: Diagnosis not present

## 2019-03-01 DIAGNOSIS — K5520 Angiodysplasia of colon without hemorrhage: Secondary | ICD-10-CM

## 2019-03-01 NOTE — Progress Notes (Signed)
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    - 7 duodenal and proximal jejunal AVMs treated with APC 06/22/18    - ooxing AVM distal to the duodenal bulb treated with APC 11/12/18    - capsule endoscopy 11/12/18 showed 3 small non-bleeding AVMs in the small bowel    - started Sandostatin LAR for recurrent blood loss anemia 11/2018 Iron deficiency anemia History of PUD: Gastric and duodenal ulcers - initially diagnosed as source of bleeding 10/2017  - H pylori + 10/2017 - treated with bismuth, metronidazole, doxycycline, PPI - subsequent gastric biopsies 01/10/18 negative for H pylori     - no PUD seen on EGD 06/22/18 Intestinal metaplasia - intestinal metaplasia on biopsies 10/10 and after H pylori irradication - patient not at high risk for gastric cancer, no family history of stomach cancer - no surveillance indicated based on AGA guidelines LA class A reflux esophagitis on EGD 01/10/18 Duodenal nodule seen on EGD 10/2017 ([place])    - Biopsy 10/2017 showed benign mucosa    - No duodenal or pancreatic abnormalities identified on MRI/MRCP planned    - EUS with Dr. 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  No recent labs to monitor iron deficiency.   PLAN: Continue monthly Sandostatin injection Monthly hemoglobin to monitor response to therapy Iron and ferritin in 2-3 months,  consider IV iron if needed Follow-up in 3 months, earlier as needed  Please see the "Patient Instructions" section for addition details about the plan.  I spent 45 minutes of time, including in depth chart review, independent review of results as outlined above, communicating results with the patient directly, face-to-face time with the patient and his wife, coordinating care, ordering studies and medications as appropriate, and documentation.   HPI: Andrew Guerra is a 79 y.o. male with ESRD on HD, moderate AS due to bicuspid valve, COPD and history of lung cancer on O2.  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 Had 7 duodenal and proximal jejunal AVMs treated with APC on June 22, 2018.  Dropped his hemoglobin again in October so was sent to the hospital for transfusion and underwent small bowel enteroscopy with Dr. Ardis Hughs 11/12/18 that showed ooxing AVM distal to the duodenal bulb and medium sized hiatal hernia with no Cameron's erosions. Capsule endoscopy 11/12/18 showed 3 small non-bleeding AVMs in the small bowel.  He had recurrent anemia and melena and was started on Sandostatin.  He returns in scheduled follow-up. His wife accompanies him to this appointment and provides support in the history.  Continues on dialysis M/W/F. Has his long-acting octreotide monthly.  Hemoglobin up to 12.9 01/25/19 from 8.1 11/23/18. Feels better. Stronger. He "hestates to say great." Can walk up a flight of stairs.He continues to lose weight. Stools are brown. No melena. Occasionally needs to [name] a stool softener. Has BM 1-2 times a week.   Baseline weight was 190 pounds. Has lost almost 30 pounds over  the last year.   Labs 08/21/18: iron 31, ferritin 157 Labs 10/02/18: iron 23, ferritin 160 Labs 11/21/18: normal liver enzymes Labs 01/25/19: BUN 32, creatinine 4.8    Past Medical History:  Diagnosis Date  . Anemia   . 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 02/08/2017   moderate by  echo 10/2017 with mean AVG 63mmHg and dimensionless index 0.31 consistent with moderate AS.  [name] Cataract    right eye - surgery to remove  . CHF (congestive heart failure) ([place])   . Elevated PSA 11/29/2016   Patient is followed by [facility].  Most recent PSA near 11.  He has had atypia on biopsy.  November 2008  . Essential hypertension, benign 06/08/2012  . Full dentures   . GERD (gastroesophageal reflux disease)   . GI bleed   . Glaucoma   . Heart murmur    since childhood, never has caused any problems  . History of rheumatic fever 11/04/2015  . Hyperlipidemia 04/25/2013  . Hypertension   . Malignant neoplasm of left lung ([place]) 02/08/2017  . RBBB 07/12/2017  . Sickle cell trait ([place])    no problems per patient    Past Surgical History:  Procedure Laterality Date  . AV FISTULA PLACEMENT Right 01/25/2019   Procedure: ARTERIOVENOUS (AV) FISTULA CREATION RIGHT ARM;  Surgeon: Rosetta Posner, MD;  Location: [facility];  Service: Vascular;  Laterality: Right;  . BIOPSY  11/11/2017   Procedure: BIOPSY;  Surgeon: Lavena Bullion, DO;  Location: [facility] ENDOSCOPY;  Service: Gastroenterology;;  . BIOPSY  01/10/2018   Procedure: BIOPSY;  Surgeon: [name], MD;  Location: [facility];  Service: Gastroenterology;;  . BIOPSY  06/22/2018   Procedure: BIOPSY;  Surgeon: Yetta Flock, MD;  Location: [facility] ENDOSCOPY;  Service: Gastroenterology;;  . CATARACT EXTRACTION Right   . cataract eye surgery Right   . COLONOSCOPY  11/2009   hx polyps/[place]  . COLONOSCOPY N/A 06/22/2018   Procedure: COLONOSCOPY;  Surgeon: Yetta Flock, MD;  Location: [facility];  Service: Gastroenterology;  Laterality: N/A;  . ENTEROSCOPY N/A 06/22/2018   Procedure: PUSH ENTEROSCOPY;  Surgeon: Yetta Flock, MD;  Location[facility] ENDOSCOPY;  Service: Gastroenterology;  Laterality: N/A;  . ENTEROSCOPY N/A 11/12/2018   Procedure: ENTEROSCOPY;  Surgeon: Milus Banister, MD;  Location: [facility] ENDOSCOPY;  Service:  Endoscopy;  Laterality: N/A;  . ESOPHAGOGASTRODUODENOSCOPY N/A 11/11/2017   Procedure: ESOPHAGOGASTRODUODENOSCOPY (EGD);  Surgeon: Lavena Bullion, DO;  Location: [facility] ENDOSCOPY;  Service: Gastroenterology;  Laterality: N/A;  . ESOPHAGOGASTRODUODENOSCOPY (EGD) WITH PROPOFOL N/A 01/10/2018   Procedure: ESOPHAGOGASTRODUODENOSCOPY (EGD) WITH PROPOFOL;  Surgeon: [name], MD;  Location: [facility];  Service: Gastroenterology;  Laterality: N/A;  . GIVENS CAPSULE STUDY N/A 11/12/2018   Procedure: GIVENS CAPSULE STUDY;  Surgeon: Milus Banister, MD;  Location[facility] ENDOSCOPY;  Service: Endoscopy;  Laterality: N/A;  . HEMOSTASIS CLIP PLACEMENT  11/12/2018   Procedure: HEMOSTASIS CLIP PLACEMENT;  Surgeon: Milus Banister, MD;  Location: [place];  Service: Endoscopy;;  . HOT HEMOSTASIS N/A 06/22/2018   Procedure: HOT HEMOSTASIS (ARGON PLASMA COAGULATION/BICAP);  Surgeon: Yetta Flock, MD;  Location: [facility] ENDOSCOPY;  Service: Gastroenterology;  Laterality: N/A;  . HOT HEMOSTASIS N/A 11/12/2018   Procedure: HOT HEMOSTASIS (ARGON PLASMA COAGULATION/BICAP);  Surgeon: Milus Banister, MD;  Location: [facility] ENDOSCOPY;  Service: Endoscopy;  Laterality: N/A;  . IR FLUORO GUIDE CV LINE RIGHT  01/16/2018  . IR FLUORO GUIDE CV LINE RIGHT  01/31/2018  .  IR FLUORO GUIDE CV LINE RIGHT  10/06/2018  . IR FLUORO GUIDE CV LINE RIGHT  10/11/2018  . IR US GUIDE VASC ACCESS RIGHT  01/16/2018  . IR US GUIDE VASC ACCESS RIGHT  01/31/2018  . IR US GUIDE VASC ACCESS RIGHT  10/06/2018  . POLYPECTOMY  06/22/2018   Procedure: POLYPECTOMY;  Surgeon: Yetta Flock, MD;  Location: [facility];  Service: Gastroenterology;;  . PROSTATE BIOPSY    . right eye surgery     to lower eye pressure  . RIGHT HEART CATH N/A 01/19/2018   Procedure: RIGHT HEART CATH;  Surgeon: Nelva Bush, MD;  Location: [place] CV LAB;  Service: Cardiovascular;  Laterality: N/A;  . TONSILLECTOMY    . VIDEO ASSISTED THORACOSCOPY  (VATS)/ LOBECTOMY Right 12/26/2015  . wisdom tteeth ext      Current Outpatient Medications  Medication Sig Dispense Refill  . amLODipine (NORVASC) 5 MG tablet Take 1 tablet (5 mg total) by mouth daily. (Patient taking differently: Take 5 mg by mouth at bedtime. ) 30 tablet 0  . Brinzolamide-Brimonidine (SIMBRINZA) 1-0.2 % SUSP Place 2-3 drops into both eyes 3 (three) times daily as needed (eye pressure).     . carvedilol (COREG) 3.125 MG tablet Take 1 tablet (3.125 mg total) by mouth 2 (two) times daily with a meal. 60 tablet 0  . ferric citrate (AURYXIA) 1 GM 210 MG(Fe) tablet Take 210 mg by mouth 5 (five) times daily. Take 1 tablet (210 mg) by mouth with meals and snacks    . hydrALAZINE (APRESOLINE) 25 MG tablet Take 1 tablet (25 mg total) by mouth every 8 (eight) hours. 90 tablet 0  . Lifitegrast (XIIDRA) 5 % SOLN Place 1 drop into both eyes 2 (two) times daily as needed (dry eyes).    . multivitamin (RENA-VIT) TABS tablet Take 1 tablet by mouth daily.    . Netarsudil-Latanoprost (ROCKLATAN) 0.02-0.005 % SOLN Place 1 drop into both eyes at bedtime.     . Nutritional Supplements (FEEDING SUPPLEMENT, NEPRO CARB STEADY,) LIQD Take 237 mLs by mouth 3 (three) times daily.     . Octreotide Acetate (SANDOSTATIN IJ) Inject 1 Dose as directed every 30 (thirty) days.     [name] oxyCODONE-acetaminophen (PERCOCET/ROXICET) 5-325 MG tablet Take 1 tablet by mouth every 6 (six) hours as needed. 8 tablet 0  . pantoprazole (PROTONIX) 40 MG tablet TAKE 1 TABLET BY MOUTH TWICE A DAY (Patient taking differently: Take 40 mg by mouth 2 (two) times daily. ) 180 tablet 1  . sevelamer carbonate (RENVELA) 800 MG tablet Take 800 mg by mouth 5 (five) times daily. Take 1 tablet (800 mg) by mouth with meals and snacks     No current facility-administered medications for this visit.    Allergies as of 03/01/2019 - Review Complete 03/01/2019  Allergen Reaction Noted  . Ambien [zolpidem tartrate] Other (See Comments)  01/31/2018  . Lipitor [atorvastatin] Other (See Comments) 07/31/2017    Family History  Problem Relation Age of Onset  . Diabetes Mother   . Cerebral aneurysm Mother   . Lung cancer Father   . Diabetes Sister   . Colon cancer Neg Hx   . Colon polyps Neg Hx   . Esophageal cancer Neg Hx   . Rectal cancer Neg Hx   . Stomach cancer Neg Hx     Social History   Socioeconomic History  . Marital status: Married    Spouse name: Benjamine Mola  . Number of children: 2  .  Years of education: Not on file  . Highest education level: Not on file  Occupational History  . Occupation: retired  Tobacco Use  . Smoking status: Former Smoker    Packs/day: 1.00    Types: Cigarettes    Quit date: 01/19/2011    Years since quitting: 8.1  . Smokeless tobacco: Never Used  Substance and Sexual Activity  . Alcohol use: Not Currently    Alcohol/week: 1.0 standard drinks    Types: 1 Shots of liquor per week    Comment: rarely  . Drug use: No  . Sexual activity: Not on file  Other Topics Concern  . Not on file  Social History Narrative  . Not on file   Social Determinants of Health   Financial Resource Strain:   . Difficulty of Paying Living Expenses: Not on file  Food Insecurity:   . Worried About Charity fundraiser in the Last Year: Not on file  . Ran Out of Food in the Last Year: Not on file  Transportation Needs:   . Lack of Transportation (Medical): Not on file  . Lack of Transportation (Non-Medical): Not on file  Physical Activity:   . Days of Exercise per Week: Not on file  . Minutes of Exercise per Session: Not on file  Stress:   . Feeling of Stress : Not on file  Social Connections:   . Frequency of Communication with Friends and Family: Not on file  . Frequency of Social Gatherings with Friends and Family: Not on file  . Attends Religious Services: Not on file  . Active Member of Clubs or Organizations: Not on file  . Attends Archivist Meetings: Not on file  .  Marital Status: Not on file  Intimate Partner Violence:   . Fear of Current or Ex-Partner: Not on file  . Emotionally Abused: Not on file  . Physically Abused: Not on file  . Sexually Abused: Not on file    Review of Systems: 12 system ROS is negative except as noted above.   Physical Exam: General:   Alert,  well-nourished, pleasant and cooperative in NAD Head:  Normocephalic and atraumatic. Eyes:  Sclera clear, no icterus.   Conjunctiva pink. Ears:  Normal auditory acuity. Nose:  No deformity, discharge,  or lesions. Mouth:  No deformity or lesions.   Neck:  Supple; no masses or thyromegaly. Lungs:  Clear throughout to auscultation.   No wheezes. Heart:  Regular rate and rhythm; no murmurs. Abdomen:  Soft,nontender, nondistended, normal bowel sounds, no rebound or guarding. No hepatosplenomegaly.   Rectal:  Deferred  Msk:  Symmetrical. No boney deformities LAD: No inguinal or umbilical LAD Extremities:  No clubbing or edema. Neurologic:  Alert and  oriented x4;  grossly nonfocal Skin:  Intact without significant lesions or rashes. Psych:  Alert and cooperative. Normal mood and affect.   Michaelanthony Kempton L. Tarri Glenn, MD, MPH 03/01/2019, 10:41 AM

## 2019-03-01 NOTE — Patient Instructions (Signed)
I value your feedback and thank you for entrusting [name] with your care. If you get a [facility] patient survey, I would appreciate you taking the time to let us know about your experience today. Thank you!   Due to recent changes in healthcare laws, you may see the results of your imaging and laboratory studies on MyChart before your provider has had a chance to review them.  We understand that in some cases there may be results that are confusing or concerning to you. Not all laboratory results come back in the same time frame and the provider may be waiting for multiple results in order to interpret others.  Please give [name] 48 hours in order for your provider to thoroughly review all the results before contacting the office for clarification of your results.

## 2019-03-02 DIAGNOSIS — N186 End stage renal disease: Secondary | ICD-10-CM | POA: Diagnosis not present

## 2019-03-02 DIAGNOSIS — N2581 Secondary hyperparathyroidism of renal origin: Secondary | ICD-10-CM | POA: Diagnosis not present

## 2019-03-02 DIAGNOSIS — Z992 Dependence on renal dialysis: Secondary | ICD-10-CM | POA: Diagnosis not present

## 2019-03-05 DIAGNOSIS — Z992 Dependence on renal dialysis: Secondary | ICD-10-CM | POA: Diagnosis not present

## 2019-03-05 DIAGNOSIS — N186 End stage renal disease: Secondary | ICD-10-CM | POA: Diagnosis not present

## 2019-03-05 DIAGNOSIS — N2581 Secondary hyperparathyroidism of renal origin: Secondary | ICD-10-CM | POA: Diagnosis not present

## 2019-03-07 DIAGNOSIS — N186 End stage renal disease: Secondary | ICD-10-CM | POA: Diagnosis not present

## 2019-03-07 DIAGNOSIS — Z992 Dependence on renal dialysis: Secondary | ICD-10-CM | POA: Diagnosis not present

## 2019-03-07 DIAGNOSIS — N2581 Secondary hyperparathyroidism of renal origin: Secondary | ICD-10-CM | POA: Diagnosis not present

## 2019-03-09 DIAGNOSIS — N186 End stage renal disease: Secondary | ICD-10-CM | POA: Diagnosis not present

## 2019-03-09 DIAGNOSIS — Z992 Dependence on renal dialysis: Secondary | ICD-10-CM | POA: Diagnosis not present

## 2019-03-09 DIAGNOSIS — N2581 Secondary hyperparathyroidism of renal origin: Secondary | ICD-10-CM | POA: Diagnosis not present

## 2019-03-12 ENCOUNTER — Ambulatory Visit (HOSPITAL_COMMUNITY)
Admission: RE | Admit: 2019-03-12 | Discharge: 2019-03-12 | Disposition: A | Discharge status: Home / Self Care | Payer: Medicare PPO | Source: Ambulatory Visit | Attending: Internal Medicine | Admitting: Internal Medicine

## 2019-03-12 ENCOUNTER — Other Ambulatory Visit: Payer: Self-pay

## 2019-03-12 DIAGNOSIS — Z992 Dependence on renal dialysis: Secondary | ICD-10-CM | POA: Diagnosis not present

## 2019-03-12 DIAGNOSIS — N2581 Secondary hyperparathyroidism of renal origin: Secondary | ICD-10-CM | POA: Diagnosis not present

## 2019-03-12 DIAGNOSIS — N186 End stage renal disease: Secondary | ICD-10-CM | POA: Diagnosis not present

## 2019-03-12 DIAGNOSIS — D62 Acute posthemorrhagic anemia: Secondary | ICD-10-CM | POA: Diagnosis not present

## 2019-03-12 MED ORDER — OCTREOTIDE ACETATE 20 MG IM KIT
20.0000 mg | PACK | Freq: Once | INTRAMUSCULAR | Status: AC
  Administered 2019-03-12: 11:00:00 20 mg via INTRAMUSCULAR
  Filled 2019-03-12 (×2): qty 1

## 2019-03-12 NOTE — Progress Notes (Signed)
PATIENT CARE CENTER NOTE  Diagnosis: Acute Blood loss anemia   Provider: [name] MD   Procedure: IM Sandostatin   Note: Patient received IM Sandostatin injection in the left hip. Tolerated well. Discharge instructions given. Patient alert, oriented and ambulatory on discharge.

## 2019-03-12 NOTE — Discharge Instructions (Signed)
Octreotide injection solution What is this medicine? OCTREOTIDE (ok TREE oh tide) is used to reduce blood levels of growth hormone in patients with a condition called acromegaly. This medicine also reduces flushing and watery diarrhea caused by certain types of cancer. This medicine may be used for other purposes; ask your health care provider or pharmacist if you have questions. COMMON BRAND NAME(S): Bynfezia, Sandostatin What should I tell my health care provider before I take this medicine? They need to know if you have any of these conditions:  diabetes  gallbladder disease  kidney disease  liver disease  thyroid disease  an unusual or allergic reaction to octreotide, other medicines, foods, dyes, or preservatives  pregnant or trying to get pregnant  breast-feeding How should I use this medicine? This medicine is for injection under the skin or into a vein (only in emergency situations). It is usually given by a health care professional in a hospital or clinic setting. If you get this medicine at home, you will be taught how to prepare and give this medicine. Allow the injection solution to come to room temperature before use. Do not warm it artificially. Use exactly as directed. Take your medicine at regular intervals. Do not take your medicine more often than directed. It is important that you put your used needles and syringes in a special sharps container. Do not put them in a trash can. If you do not have a sharps container, call your pharmacist or healthcare provider to get one. Talk to your pediatrician regarding the use of this medicine in children. Special care may be needed. Overdosage: If you think you have taken too much of this medicine contact a poison control center or emergency room at once. NOTE: This medicine is only for you. Do not share this medicine with others. What if I miss a dose? If you miss a dose, take it as soon as you can. If it is almost time for your  next dose, take only that dose. Do not take double or extra doses. What may interact with this medicine?  bromocriptine  certain medicines for blood pressure, heart disease, irregular heartbeat  cyclosporine  diuretics  medicines for diabetes, including insulin  quinidine This list may not describe all possible interactions. Give your health care provider a list of all the medicines, herbs, non-prescription drugs, or dietary supplements you use. Also tell them if you smoke, drink alcohol, or use illegal drugs. Some items may interact with your medicine. What should I watch for while using this medicine? Visit your doctor or health care professional for regular checks on your progress. To help reduce irritation at the injection site, use a different site for each injection and make sure the solution is at room temperature before use. This medicine may cause decreases in blood sugar. Signs of low blood sugar include chills, cool, pale skin or cold sweats, drowsiness, extreme hunger, fast heartbeat, headache, nausea, nervousness or anxiety, shakiness, trembling, unsteadiness, tiredness, or weakness. Contact your doctor or health care professional right away if you experience any of these symptoms. This medicine may increase blood sugar. Ask your healthcare provider if changes in diet or medicines are needed if you have diabetes. This medicine may cause a decrease in vitamin B12. You should make sure that you get enough vitamin B12 while you are taking this medicine. Discuss the foods you eat and the vitamins you take with your health care professional. What side effects may I notice from receiving this medicine? Side   effects that you should report to your doctor or health care professional as soon as possible:  allergic reactions like skin rash, itching or hives, swelling of the face, lips, or tongue  fast, slow, or irregular heartbeat  right upper belly pain  severe stomach pain  signs  and symptoms of high blood sugar such as being more thirsty or hungry or having to urinate more than normal. You may also feel very tired or have blurry vision.  signs and symptoms of low blood sugar such as feeling anxious; confusion; dizziness; increased hunger; unusually weak or tired; increased sweating; shakiness; cold, clammy skin; irritable; headache; blurred vision; fast heartbeat; loss of consciousness  unusually weak or tired Side effects that usually do not require medical attention (report to your doctor or health care professional if they continue or are bothersome):  diarrhea  dizziness  gas  headache  nausea, vomiting  pain, redness, or irritation at site where injected  upset stomach This list may not describe all possible side effects. Call your doctor for medical advice about side effects. You may report side effects to FDA at 1-800-FDA-1088. Where should I keep my medicine? Keep out of the reach of children. Store in a refrigerator between 2 and 8 degrees C (36 and 46 degrees F). Protect from light. Allow to come to room temperature naturally. Do not use artificial heat. If protected from light, the injection may be stored at room temperature between 20 and 30 degrees C (70 and 86 degrees F) for 14 days. After the initial use, throw away any unused portion of a multiple dose vial after 14 days. Throw away unused portions of the ampules after use. NOTE: This sheet is a summary. It may not cover all possible information. If you have questions about this medicine, talk to your doctor, pharmacist, or health care provider.  2020 Elsevier/Gold Standard (2018-08-03 13:33:09)  

## 2019-03-14 ENCOUNTER — Other Ambulatory Visit (HOSPITAL_COMMUNITY): Payer: Self-pay | Admitting: Nephrology

## 2019-03-14 DIAGNOSIS — N186 End stage renal disease: Secondary | ICD-10-CM | POA: Diagnosis not present

## 2019-03-14 DIAGNOSIS — N2581 Secondary hyperparathyroidism of renal origin: Secondary | ICD-10-CM | POA: Diagnosis not present

## 2019-03-14 DIAGNOSIS — Z992 Dependence on renal dialysis: Secondary | ICD-10-CM | POA: Diagnosis not present

## 2019-03-15 ENCOUNTER — Ambulatory Visit (HOSPITAL_COMMUNITY)
Admission: RE | Admit: 2019-03-15 | Discharge: 2019-03-15 | Disposition: A | Discharge status: Home / Self Care | Payer: Medicare PPO | Source: Ambulatory Visit | Attending: Nephrology | Admitting: Nephrology

## 2019-03-15 ENCOUNTER — Other Ambulatory Visit: Payer: Self-pay

## 2019-03-15 DIAGNOSIS — N186 End stage renal disease: Secondary | ICD-10-CM | POA: Diagnosis not present

## 2019-03-15 DIAGNOSIS — T8249XA Other complication of vascular dialysis catheter, initial encounter: Secondary | ICD-10-CM | POA: Diagnosis not present

## 2019-03-15 DIAGNOSIS — Z4901 Encounter for fitting and adjustment of extracorporeal dialysis catheter: Secondary | ICD-10-CM | POA: Insufficient documentation

## 2019-03-15 DIAGNOSIS — T8243XA Leakage of vascular dialysis catheter, initial encounter: Secondary | ICD-10-CM | POA: Diagnosis not present

## 2019-03-15 HISTORY — PX: IR FLUORO GUIDE CV LINE LEFT: IMG2282

## 2019-03-15 MED ORDER — CHLORHEXIDINE GLUCONATE 4 % EX LIQD
CUTANEOUS | Status: AC
  Filled 2019-03-15: qty 15

## 2019-03-15 MED ORDER — CEFAZOLIN SODIUM-DEXTROSE 2-4 GM/100ML-% IV SOLN
INTRAVENOUS | Status: AC
  Administered 2019-03-15: 2 g via INTRAVENOUS
  Filled 2019-03-15: qty 100

## 2019-03-15 MED ORDER — CEFAZOLIN SODIUM-DEXTROSE 2-4 GM/100ML-% IV SOLN: 2.0000 g | Freq: Once | INTRAVENOUS | Status: AC

## 2019-03-15 MED ORDER — LIDOCAINE HCL 1 % IJ SOLN
INTRAMUSCULAR | Status: DC | PRN
  Administered 2019-03-15: 10 mL

## 2019-03-15 MED ORDER — HEPARIN SODIUM (PORCINE) 1000 UNIT/ML IJ SOLN
INTRAMUSCULAR | Status: AC
  Administered 2019-03-15: 3.2 mL via ARTERIOVENOUS_FISTULA
  Filled 2019-03-15: qty 1

## 2019-03-15 MED ORDER — LIDOCAINE HCL 1 % IJ SOLN
INTRAMUSCULAR | Status: AC
  Filled 2019-03-15: qty 20

## 2019-03-15 NOTE — Procedures (Signed)
Interventional Radiology Procedure Note  Procedure: Replacement of a right IJ approach tunneled HD catheter.  New 19cm tip to cuff.  Tip is positioned at the superior cavoatrial junction and catheter is ready for immediate use.  Complications: None Recommendations:  - Ok to use - Do not submerge - Routine line care   Signed,  Dulcy Fanny. Earleen Newport, DO

## 2019-03-15 NOTE — Progress Notes (Signed)
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  Patient presents with malfunctioning tunneled R HD catheter.  He had a R forearm fistula created 01/25/19 which appears well healed with palpable thrill.  Per patient, this will be not be used until 4/1.  His tunneled catheter is leaking during dialysis and request made for line exchange.   Risks and benefits discussed with the patient including, but not limited to bleeding, infection, vascular injury, pneumothorax which may require chest tube placement, air embolism or even death  All of the patient's questions were answered, patient is agreeable to proceed. Consent signed and in chart.  ______________________________________________________________________   History of Present Illness: Andrew Guerra is a 79 y.o. male with history of ESRD on HD via R tunneled HD catheter.  He reports leaking, bleeding from catheter during dialysis.  He presents today for tunneled HD catheter exchange  Allergies and medications reviewed.   Review of Systems: A 12 point ROS discussed and pertinent positives are indicated in the HPI above.  All other systems are negative.  Review of Systems  Constitutional: Negative for fatigue and fever.  Respiratory: Negative for cough and shortness of breath.   Cardiovascular: Negative for chest pain.  Gastrointestinal: Negative for abdominal pain and nausea.  Genitourinary: Negative for dysuria.  Musculoskeletal: Negative for back pain.  Neurological: Negative for weakness.  Psychiatric/Behavioral: Negative for behavioral problems and confusion.    Vital Signs: There were no vitals taken for this visit.  Physical Exam Vitals and nursing note reviewed.  Constitutional:      General: He is not in acute distress.    Appearance: He is not ill-appearing.  HENT:     Mouth/Throat:     Mouth: Mucous membranes are moist.     Pharynx: Oropharynx is clear.    Neck:     Comments: Tunneled R dialysis catheter. Cardiovascular:     Rate and Rhythm: Normal rate and regular rhythm.  Pulmonary:     Effort: Pulmonary effort is normal. No respiratory distress.     Breath sounds: Normal breath sounds.  Abdominal:     General: Abdomen is flat.     Palpations: Abdomen is soft.  Musculoskeletal:     Cervical back: Normal range of motion and neck supple.  Skin:    General: Skin is warm and dry.  Neurological:     General: No focal deficit present.     Mental Status: He is alert and oriented to person, place, and time. Mental status is at baseline.  Psychiatric:        Mood and Affect: Mood normal.        Behavior: Behavior normal.        Thought Content: Thought content normal.        Judgment: Judgment normal.      Imaging reviewed.   Labs:  COAGS: Recent Labs    05/15/18 1026 10/04/18 1320 10/11/18 0935 11/21/18 1450  INR 1.1 1.1 1.1 1.0    BMP: Recent Labs    11/11/18 0541 11/11/18 0541 11/12/18 0459 11/17/18 1758 11/21/18 1450 01/25/19 0728  NA 139   < > 139 139 139 138  K 4.2   < > 4.1 2.8* 3.4* 4.1  CL 98   < > 98 96* 97* 96*  CO2 26  --  28 29 30   --   GLUCOSE 75   < > 82 99 91 91  BUN  64*   < > 35* 18 40* 32*  CALCIUM 7.7*  --  7.9* 7.1* 7.5*  --   CREATININE 3.94*   < > 2.89* 2.24* 4.15* 4.80*  GFRNONAA 13*  --  20* 27* 13*  --   GFRAA 16*  --  23* 31* 15*  --    < > = values in this interval not displayed.       Electronically Signed: Docia Barrier, PA 03/15/2019, 1:41 PM   I spent a total of 15 minutes in face to face in clinical consultation, greater than 50% of which was counseling/coordinating care for venous access.

## 2019-03-16 DIAGNOSIS — N186 End stage renal disease: Secondary | ICD-10-CM | POA: Diagnosis not present

## 2019-03-16 DIAGNOSIS — N2581 Secondary hyperparathyroidism of renal origin: Secondary | ICD-10-CM | POA: Diagnosis not present

## 2019-03-16 DIAGNOSIS — Z992 Dependence on renal dialysis: Secondary | ICD-10-CM | POA: Diagnosis not present

## 2019-03-18 DIAGNOSIS — Z992 Dependence on renal dialysis: Secondary | ICD-10-CM | POA: Diagnosis not present

## 2019-03-18 DIAGNOSIS — R0600 Dyspnea, unspecified: Secondary | ICD-10-CM | POA: Diagnosis not present

## 2019-03-18 DIAGNOSIS — I5033 Acute on chronic diastolic (congestive) heart failure: Secondary | ICD-10-CM | POA: Diagnosis not present

## 2019-03-18 DIAGNOSIS — N186 End stage renal disease: Secondary | ICD-10-CM | POA: Diagnosis not present

## 2019-03-18 DIAGNOSIS — N032 Chronic nephritic syndrome with diffuse membranous glomerulonephritis: Secondary | ICD-10-CM | POA: Diagnosis not present

## 2019-03-18 DIAGNOSIS — C3492 Malignant neoplasm of unspecified part of left bronchus or lung: Secondary | ICD-10-CM | POA: Diagnosis not present

## 2019-03-19 ENCOUNTER — Ambulatory Visit: Payer: Medicare PPO | Attending: Internal Medicine

## 2019-03-19 DIAGNOSIS — N186 End stage renal disease: Secondary | ICD-10-CM | POA: Diagnosis not present

## 2019-03-19 DIAGNOSIS — Z23 Encounter for immunization: Secondary | ICD-10-CM | POA: Insufficient documentation

## 2019-03-19 DIAGNOSIS — Z992 Dependence on renal dialysis: Secondary | ICD-10-CM | POA: Diagnosis not present

## 2019-03-19 DIAGNOSIS — N2581 Secondary hyperparathyroidism of renal origin: Secondary | ICD-10-CM | POA: Diagnosis not present

## 2019-03-19 NOTE — Progress Notes (Signed)
   Covid-19 Vaccination Clinic  Name:  Andrew Guerra    MRN: [id number] DOB: 08-17-1940  03/19/2019  [name] was observed post Covid-19 immunization for 15 minutes without incidence. He was provided with Vaccine Information Sheet and instruction to access the V-Safe system.   [name] was instructed to call 911 with any severe reactions post vaccine: [name] Difficulty breathing  . Swelling of your face and throat  . A fast heartbeat  . A bad rash all over your body  . Dizziness and weakness    Immunizations Administered    Name Date Dose VIS Date Route   Pfizer COVID-19 Vaccine 03/19/2019  9:50 AM 0.3 mL 12/29/2018 Intramuscular   Manufacturer: [place]   Lot: [id number]   [place]: [id number]

## 2019-03-21 DIAGNOSIS — Z992 Dependence on renal dialysis: Secondary | ICD-10-CM | POA: Diagnosis not present

## 2019-03-21 DIAGNOSIS — N186 End stage renal disease: Secondary | ICD-10-CM | POA: Diagnosis not present

## 2019-03-21 DIAGNOSIS — N2581 Secondary hyperparathyroidism of renal origin: Secondary | ICD-10-CM | POA: Diagnosis not present

## 2019-03-23 DIAGNOSIS — N2581 Secondary hyperparathyroidism of renal origin: Secondary | ICD-10-CM | POA: Diagnosis not present

## 2019-03-23 DIAGNOSIS — Z992 Dependence on renal dialysis: Secondary | ICD-10-CM | POA: Diagnosis not present

## 2019-03-23 DIAGNOSIS — N186 End stage renal disease: Secondary | ICD-10-CM | POA: Diagnosis not present

## 2019-03-26 DIAGNOSIS — N186 End stage renal disease: Secondary | ICD-10-CM | POA: Diagnosis not present

## 2019-03-26 DIAGNOSIS — Z992 Dependence on renal dialysis: Secondary | ICD-10-CM | POA: Diagnosis not present

## 2019-03-26 DIAGNOSIS — N2581 Secondary hyperparathyroidism of renal origin: Secondary | ICD-10-CM | POA: Diagnosis not present

## 2019-03-28 DIAGNOSIS — Z992 Dependence on renal dialysis: Secondary | ICD-10-CM | POA: Diagnosis not present

## 2019-03-28 DIAGNOSIS — N186 End stage renal disease: Secondary | ICD-10-CM | POA: Diagnosis not present

## 2019-03-28 DIAGNOSIS — N2581 Secondary hyperparathyroidism of renal origin: Secondary | ICD-10-CM | POA: Diagnosis not present

## 2019-03-30 DIAGNOSIS — Z992 Dependence on renal dialysis: Secondary | ICD-10-CM | POA: Diagnosis not present

## 2019-03-30 DIAGNOSIS — N186 End stage renal disease: Secondary | ICD-10-CM | POA: Diagnosis not present

## 2019-03-30 DIAGNOSIS — N2581 Secondary hyperparathyroidism of renal origin: Secondary | ICD-10-CM | POA: Diagnosis not present

## 2019-04-02 DIAGNOSIS — Z992 Dependence on renal dialysis: Secondary | ICD-10-CM | POA: Diagnosis not present

## 2019-04-02 DIAGNOSIS — N2581 Secondary hyperparathyroidism of renal origin: Secondary | ICD-10-CM | POA: Diagnosis not present

## 2019-04-02 DIAGNOSIS — N186 End stage renal disease: Secondary | ICD-10-CM | POA: Diagnosis not present

## 2019-04-04 DIAGNOSIS — N2581 Secondary hyperparathyroidism of renal origin: Secondary | ICD-10-CM | POA: Diagnosis not present

## 2019-04-04 DIAGNOSIS — N186 End stage renal disease: Secondary | ICD-10-CM | POA: Diagnosis not present

## 2019-04-04 DIAGNOSIS — Z992 Dependence on renal dialysis: Secondary | ICD-10-CM | POA: Diagnosis not present

## 2019-04-06 DIAGNOSIS — Z992 Dependence on renal dialysis: Secondary | ICD-10-CM | POA: Diagnosis not present

## 2019-04-06 DIAGNOSIS — N186 End stage renal disease: Secondary | ICD-10-CM | POA: Diagnosis not present

## 2019-04-06 DIAGNOSIS — N2581 Secondary hyperparathyroidism of renal origin: Secondary | ICD-10-CM | POA: Diagnosis not present

## 2019-04-09 DIAGNOSIS — N2581 Secondary hyperparathyroidism of renal origin: Secondary | ICD-10-CM | POA: Diagnosis not present

## 2019-04-09 DIAGNOSIS — N186 End stage renal disease: Secondary | ICD-10-CM | POA: Diagnosis not present

## 2019-04-09 DIAGNOSIS — Z992 Dependence on renal dialysis: Secondary | ICD-10-CM | POA: Diagnosis not present

## 2019-04-10 ENCOUNTER — Other Ambulatory Visit: Payer: Self-pay

## 2019-04-10 ENCOUNTER — Ambulatory Visit (HOSPITAL_COMMUNITY)
Admission: RE | Admit: 2019-04-10 | Discharge: 2019-04-10 | Disposition: A | Discharge status: Home / Self Care | Payer: Medicare PPO | Source: Ambulatory Visit | Attending: Internal Medicine | Admitting: Internal Medicine

## 2019-04-10 DIAGNOSIS — D62 Acute posthemorrhagic anemia: Secondary | ICD-10-CM | POA: Insufficient documentation

## 2019-04-10 MED ORDER — OCTREOTIDE ACETATE 20 MG IM KIT
20.0000 mg | PACK | Freq: Once | INTRAMUSCULAR | Status: AC
  Administered 2019-04-10: 20 mg via INTRAMUSCULAR
  Filled 2019-04-10: qty 1

## 2019-04-10 NOTE — Discharge Instructions (Signed)
Octreotide injection solution What is this medicine? OCTREOTIDE (ok TREE oh tide) is used to reduce blood levels of growth hormone in patients with a condition called acromegaly. This medicine also reduces flushing and watery diarrhea caused by certain types of cancer. This medicine may be used for other purposes; ask your health care provider or pharmacist if you have questions. COMMON BRAND NAME(S): Bynfezia, Sandostatin What should I tell my health care provider before I take this medicine? They need to know if you have any of these conditions:  diabetes  gallbladder disease  kidney disease  liver disease  thyroid disease  an unusual or allergic reaction to octreotide, other medicines, foods, dyes, or preservatives  pregnant or trying to get pregnant  breast-feeding How should I use this medicine? This medicine is for injection under the skin or into a vein (only in emergency situations). It is usually given by a health care professional in a hospital or clinic setting. If you get this medicine at home, you will be taught how to prepare and give this medicine. Allow the injection solution to come to room temperature before use. Do not warm it artificially. Use exactly as directed. Take your medicine at regular intervals. Do not take your medicine more often than directed. It is important that you put your used needles and syringes in a special sharps container. Do not put them in a trash can. If you do not have a sharps container, call your pharmacist or healthcare provider to get one. Talk to your pediatrician regarding the use of this medicine in children. Special care may be needed. Overdosage: If you think you have taken too much of this medicine contact a poison control center or emergency room at once. NOTE: This medicine is only for you. Do not share this medicine with others. What if I miss a dose? If you miss a dose, take it as soon as you can. If it is almost time for your  next dose, take only that dose. Do not take double or extra doses. What may interact with this medicine?  bromocriptine  certain medicines for blood pressure, heart disease, irregular heartbeat  cyclosporine  diuretics  medicines for diabetes, including insulin  quinidine This list may not describe all possible interactions. Give your health care provider a list of all the medicines, herbs, non-prescription drugs, or dietary supplements you use. Also tell them if you smoke, drink alcohol, or use illegal drugs. Some items may interact with your medicine. What should I watch for while using this medicine? Visit your doctor or health care professional for regular checks on your progress. To help reduce irritation at the injection site, use a different site for each injection and make sure the solution is at room temperature before use. This medicine may cause decreases in blood sugar. Signs of low blood sugar include chills, cool, pale skin or cold sweats, drowsiness, extreme hunger, fast heartbeat, headache, nausea, nervousness or anxiety, shakiness, trembling, unsteadiness, tiredness, or weakness. Contact your doctor or health care professional right away if you experience any of these symptoms. This medicine may increase blood sugar. Ask your healthcare provider if changes in diet or medicines are needed if you have diabetes. This medicine may cause a decrease in vitamin B12. You should make sure that you get enough vitamin B12 while you are taking this medicine. Discuss the foods you eat and the vitamins you take with your health care professional. What side effects may I notice from receiving this medicine? Side   effects that you should report to your doctor or health care professional as soon as possible:  allergic reactions like skin rash, itching or hives, swelling of the face, lips, or tongue  fast, slow, or irregular heartbeat  right upper belly pain  severe stomach pain  signs  and symptoms of high blood sugar such as being more thirsty or hungry or having to urinate more than normal. You may also feel very tired or have blurry vision.  signs and symptoms of low blood sugar such as feeling anxious; confusion; dizziness; increased hunger; unusually weak or tired; increased sweating; shakiness; cold, clammy skin; irritable; headache; blurred vision; fast heartbeat; loss of consciousness  unusually weak or tired Side effects that usually do not require medical attention (report to your doctor or health care professional if they continue or are bothersome):  diarrhea  dizziness  gas  headache  nausea, vomiting  pain, redness, or irritation at site where injected  upset stomach This list may not describe all possible side effects. Call your doctor for medical advice about side effects. You may report side effects to FDA at 1-800-FDA-1088. Where should I keep my medicine? Keep out of the reach of children. Store in a refrigerator between 2 and 8 degrees C (36 and 46 degrees F). Protect from light. Allow to come to room temperature naturally. Do not use artificial heat. If protected from light, the injection may be stored at room temperature between 20 and 30 degrees C (70 and 86 degrees F) for 14 days. After the initial use, throw away any unused portion of a multiple dose vial after 14 days. Throw away unused portions of the ampules after use. NOTE: This sheet is a summary. It may not cover all possible information. If you have questions about this medicine, talk to your doctor, pharmacist, or health care provider.  2020 Elsevier/Gold Standard (2018-08-03 13:33:09)  

## 2019-04-10 NOTE — Progress Notes (Signed)
PATIENT CARE CENTER NOTE  Diagnosis: Acute blood loss anemia (D62)   Provider: [name], MD   Procedure: IM Sandostatin (Octreotide)    Note: Patient received IM Sandostatin injection in the left hip. Tolerated well. Discharge instructions given. Patient alert, oriented and ambulatory at discharge.

## 2019-04-11 ENCOUNTER — Ambulatory Visit: Payer: Medicare PPO | Attending: Internal Medicine

## 2019-04-11 DIAGNOSIS — Z23 Encounter for immunization: Secondary | ICD-10-CM

## 2019-04-11 DIAGNOSIS — N186 End stage renal disease: Secondary | ICD-10-CM | POA: Diagnosis not present

## 2019-04-11 DIAGNOSIS — N2581 Secondary hyperparathyroidism of renal origin: Secondary | ICD-10-CM | POA: Diagnosis not present

## 2019-04-11 DIAGNOSIS — Z992 Dependence on renal dialysis: Secondary | ICD-10-CM | POA: Diagnosis not present

## 2019-04-11 NOTE — Progress Notes (Signed)
   Covid-19 Vaccination Clinic  Name:  MARKY BURESH    MRN: [id number] DOB: Nov 03, 1940  04/11/2019  Mr. Ende was observed post Covid-19 immunization for 15 minutes without incident. He was provided with Vaccine Information Sheet and instruction to access the V-Safe system.   Mr. Yusuf was instructed to call 911 with any severe reactions post vaccine: [name] Difficulty breathing  . Swelling of face and throat  . A fast heartbeat  . A bad rash all over body  . Dizziness and weakness   Immunizations Administered    Name Date Dose VIS Date Route   Pfizer COVID-19 Vaccine 04/11/2019  8:51 AM 0.3 mL 12/29/2018 Intramuscular   Manufacturer: [place]   Lot: [id number]   [place]: [id number]

## 2019-04-13 DIAGNOSIS — N2581 Secondary hyperparathyroidism of renal origin: Secondary | ICD-10-CM | POA: Diagnosis not present

## 2019-04-13 DIAGNOSIS — Z992 Dependence on renal dialysis: Secondary | ICD-10-CM | POA: Diagnosis not present

## 2019-04-13 DIAGNOSIS — N186 End stage renal disease: Secondary | ICD-10-CM | POA: Diagnosis not present

## 2019-04-15 DIAGNOSIS — I5033 Acute on chronic diastolic (congestive) heart failure: Secondary | ICD-10-CM | POA: Diagnosis not present

## 2019-04-15 DIAGNOSIS — C3492 Malignant neoplasm of unspecified part of left bronchus or lung: Secondary | ICD-10-CM | POA: Diagnosis not present

## 2019-04-15 DIAGNOSIS — R0600 Dyspnea, unspecified: Secondary | ICD-10-CM | POA: Diagnosis not present

## 2019-04-16 DIAGNOSIS — N2581 Secondary hyperparathyroidism of renal origin: Secondary | ICD-10-CM | POA: Diagnosis not present

## 2019-04-16 DIAGNOSIS — Z992 Dependence on renal dialysis: Secondary | ICD-10-CM | POA: Diagnosis not present

## 2019-04-16 DIAGNOSIS — N186 End stage renal disease: Secondary | ICD-10-CM | POA: Diagnosis not present

## 2019-04-18 DIAGNOSIS — N032 Chronic nephritic syndrome with diffuse membranous glomerulonephritis: Secondary | ICD-10-CM | POA: Diagnosis not present

## 2019-04-18 DIAGNOSIS — Z992 Dependence on renal dialysis: Secondary | ICD-10-CM | POA: Diagnosis not present

## 2019-04-18 DIAGNOSIS — N2581 Secondary hyperparathyroidism of renal origin: Secondary | ICD-10-CM | POA: Diagnosis not present

## 2019-04-18 DIAGNOSIS — N186 End stage renal disease: Secondary | ICD-10-CM | POA: Diagnosis not present

## 2019-04-20 DIAGNOSIS — N186 End stage renal disease: Secondary | ICD-10-CM | POA: Diagnosis not present

## 2019-04-20 DIAGNOSIS — N2581 Secondary hyperparathyroidism of renal origin: Secondary | ICD-10-CM | POA: Diagnosis not present

## 2019-04-20 DIAGNOSIS — Z992 Dependence on renal dialysis: Secondary | ICD-10-CM | POA: Diagnosis not present

## 2019-04-23 DIAGNOSIS — N2581 Secondary hyperparathyroidism of renal origin: Secondary | ICD-10-CM | POA: Diagnosis not present

## 2019-04-23 DIAGNOSIS — Z992 Dependence on renal dialysis: Secondary | ICD-10-CM | POA: Diagnosis not present

## 2019-04-23 DIAGNOSIS — N186 End stage renal disease: Secondary | ICD-10-CM | POA: Diagnosis not present

## 2019-04-25 DIAGNOSIS — Z992 Dependence on renal dialysis: Secondary | ICD-10-CM | POA: Diagnosis not present

## 2019-04-25 DIAGNOSIS — N2581 Secondary hyperparathyroidism of renal origin: Secondary | ICD-10-CM | POA: Diagnosis not present

## 2019-04-25 DIAGNOSIS — N186 End stage renal disease: Secondary | ICD-10-CM | POA: Diagnosis not present

## 2019-04-27 DIAGNOSIS — N2581 Secondary hyperparathyroidism of renal origin: Secondary | ICD-10-CM | POA: Diagnosis not present

## 2019-04-27 DIAGNOSIS — N186 End stage renal disease: Secondary | ICD-10-CM | POA: Diagnosis not present

## 2019-04-27 DIAGNOSIS — Z992 Dependence on renal dialysis: Secondary | ICD-10-CM | POA: Diagnosis not present

## 2019-04-30 DIAGNOSIS — Z992 Dependence on renal dialysis: Secondary | ICD-10-CM | POA: Diagnosis not present

## 2019-04-30 DIAGNOSIS — N186 End stage renal disease: Secondary | ICD-10-CM | POA: Diagnosis not present

## 2019-04-30 DIAGNOSIS — N2581 Secondary hyperparathyroidism of renal origin: Secondary | ICD-10-CM | POA: Diagnosis not present

## 2019-05-02 DIAGNOSIS — N186 End stage renal disease: Secondary | ICD-10-CM | POA: Diagnosis not present

## 2019-05-02 DIAGNOSIS — N2581 Secondary hyperparathyroidism of renal origin: Secondary | ICD-10-CM | POA: Diagnosis not present

## 2019-05-02 DIAGNOSIS — Z992 Dependence on renal dialysis: Secondary | ICD-10-CM | POA: Diagnosis not present

## 2019-05-04 DIAGNOSIS — N186 End stage renal disease: Secondary | ICD-10-CM | POA: Diagnosis not present

## 2019-05-04 DIAGNOSIS — N2581 Secondary hyperparathyroidism of renal origin: Secondary | ICD-10-CM | POA: Diagnosis not present

## 2019-05-04 DIAGNOSIS — Z992 Dependence on renal dialysis: Secondary | ICD-10-CM | POA: Diagnosis not present

## 2019-05-05 ENCOUNTER — Other Ambulatory Visit: Payer: Self-pay | Admitting: Gastroenterology

## 2019-05-07 ENCOUNTER — Other Ambulatory Visit (HOSPITAL_COMMUNITY): Payer: Self-pay | Admitting: Nephrology

## 2019-05-07 DIAGNOSIS — N186 End stage renal disease: Secondary | ICD-10-CM

## 2019-05-07 DIAGNOSIS — Z992 Dependence on renal dialysis: Secondary | ICD-10-CM | POA: Diagnosis not present

## 2019-05-07 DIAGNOSIS — N2581 Secondary hyperparathyroidism of renal origin: Secondary | ICD-10-CM | POA: Diagnosis not present

## 2019-05-09 DIAGNOSIS — N2581 Secondary hyperparathyroidism of renal origin: Secondary | ICD-10-CM | POA: Diagnosis not present

## 2019-05-09 DIAGNOSIS — N186 End stage renal disease: Secondary | ICD-10-CM | POA: Diagnosis not present

## 2019-05-09 DIAGNOSIS — Z992 Dependence on renal dialysis: Secondary | ICD-10-CM | POA: Diagnosis not present

## 2019-05-10 ENCOUNTER — Ambulatory Visit (HOSPITAL_COMMUNITY)
Admission: RE | Admit: 2019-05-10 | Discharge: 2019-05-10 | Disposition: A | Discharge status: Home / Self Care | Payer: Medicare PPO | Source: Ambulatory Visit | Attending: Nephrology | Admitting: Nephrology

## 2019-05-10 ENCOUNTER — Other Ambulatory Visit: Payer: Self-pay

## 2019-05-10 ENCOUNTER — Ambulatory Visit (HOSPITAL_COMMUNITY): Admission: RE | Admit: 2019-05-10 | Payer: Medicare PPO | Source: Ambulatory Visit

## 2019-05-10 ENCOUNTER — Ambulatory Visit (HOSPITAL_COMMUNITY)
Admission: RE | Admit: 2019-05-10 | Discharge: 2019-05-10 | Disposition: A | Discharge status: Home / Self Care | Payer: Medicare PPO | Source: Ambulatory Visit | Attending: Internal Medicine | Admitting: Internal Medicine

## 2019-05-10 ENCOUNTER — Encounter (HOSPITAL_COMMUNITY): Payer: Self-pay

## 2019-05-10 DIAGNOSIS — D62 Acute posthemorrhagic anemia: Secondary | ICD-10-CM | POA: Diagnosis not present

## 2019-05-10 DIAGNOSIS — N186 End stage renal disease: Secondary | ICD-10-CM

## 2019-05-10 DIAGNOSIS — Z4901 Encounter for fitting and adjustment of extracorporeal dialysis catheter: Secondary | ICD-10-CM | POA: Insufficient documentation

## 2019-05-10 HISTORY — PX: IR REMOVAL TUN CV CATH W/O FL: IMG2289

## 2019-05-10 MED ORDER — LIDOCAINE HCL (PF) 1 % IJ SOLN
INTRAMUSCULAR | Status: DC | PRN
  Administered 2019-05-10: 5 mL

## 2019-05-10 MED ORDER — CHLORHEXIDINE GLUCONATE 4 % EX LIQD
CUTANEOUS | Status: AC
  Filled 2019-05-10: qty 15

## 2019-05-10 MED ORDER — LIDOCAINE HCL 1 % IJ SOLN
INTRAMUSCULAR | Status: AC
  Filled 2019-05-10: qty 20

## 2019-05-10 MED ORDER — OCTREOTIDE ACETATE 20 MG IM KIT
20.0000 mg | PACK | Freq: Once | INTRAMUSCULAR | Status: AC
  Administered 2019-05-10: 20 mg via INTRAMUSCULAR
  Filled 2019-05-10: qty 1

## 2019-05-10 NOTE — Discharge Instructions (Signed)
Octreotide injection solution What is this medicine? OCTREOTIDE (ok TREE oh tide) is used to reduce blood levels of growth hormone in patients with a condition called acromegaly. This medicine also reduces flushing and watery diarrhea caused by certain types of cancer. This medicine may be used for other purposes; ask your health care provider or pharmacist if you have questions. COMMON BRAND NAME(S): Bynfezia, Sandostatin What should I tell my health care provider before I take this medicine? They need to know if you have any of these conditions:  diabetes  gallbladder disease  kidney disease  liver disease  thyroid disease  an unusual or allergic reaction to octreotide, other medicines, foods, dyes, or preservatives  pregnant or trying to get pregnant  breast-feeding How should I use this medicine? This medicine is for injection under the skin or into a vein (only in emergency situations). It is usually given by a health care professional in a hospital or clinic setting. If you get this medicine at home, you will be taught how to prepare and give this medicine. Allow the injection solution to come to room temperature before use. Do not warm it artificially. Use exactly as directed. Take your medicine at regular intervals. Do not take your medicine more often than directed. It is important that you put your used needles and syringes in a special sharps container. Do not put them in a trash can. If you do not have a sharps container, call your pharmacist or healthcare provider to get one. Talk to your pediatrician regarding the use of this medicine in children. Special care may be needed. Overdosage: If you think you have taken too much of this medicine contact a poison control center or emergency room at once. NOTE: This medicine is only for you. Do not share this medicine with others. What if I miss a dose? If you miss a dose, take it as soon as you can. If it is almost time for your  next dose, take only that dose. Do not take double or extra doses. What may interact with this medicine?  bromocriptine  certain medicines for blood pressure, heart disease, irregular heartbeat  cyclosporine  diuretics  medicines for diabetes, including insulin  quinidine This list may not describe all possible interactions. Give your health care provider a list of all the medicines, herbs, non-prescription drugs, or dietary supplements you use. Also tell them if you smoke, drink alcohol, or use illegal drugs. Some items may interact with your medicine. What should I watch for while using this medicine? Visit your doctor or health care professional for regular checks on your progress. To help reduce irritation at the injection site, use a different site for each injection and make sure the solution is at room temperature before use. This medicine may cause decreases in blood sugar. Signs of low blood sugar include chills, cool, pale skin or cold sweats, drowsiness, extreme hunger, fast heartbeat, headache, nausea, nervousness or anxiety, shakiness, trembling, unsteadiness, tiredness, or weakness. Contact your doctor or health care professional right away if you experience any of these symptoms. This medicine may increase blood sugar. Ask your healthcare provider if changes in diet or medicines are needed if you have diabetes. This medicine may cause a decrease in vitamin B12. You should make sure that you get enough vitamin B12 while you are taking this medicine. Discuss the foods you eat and the vitamins you take with your health care professional. What side effects may I notice from receiving this medicine? Side   effects that you should report to your doctor or health care professional as soon as possible:  allergic reactions like skin rash, itching or hives, swelling of the face, lips, or tongue  fast, slow, or irregular heartbeat  right upper belly pain  severe stomach pain  signs  and symptoms of high blood sugar such as being more thirsty or hungry or having to urinate more than normal. You may also feel very tired or have blurry vision.  signs and symptoms of low blood sugar such as feeling anxious; confusion; dizziness; increased hunger; unusually weak or tired; increased sweating; shakiness; cold, clammy skin; irritable; headache; blurred vision; fast heartbeat; loss of consciousness  unusually weak or tired Side effects that usually do not require medical attention (report to your doctor or health care professional if they continue or are bothersome):  diarrhea  dizziness  gas  headache  nausea, vomiting  pain, redness, or irritation at site where injected  upset stomach This list may not describe all possible side effects. Call your doctor for medical advice about side effects. You may report side effects to FDA at 1-800-FDA-1088. Where should I keep my medicine? Keep out of the reach of children. Store in a refrigerator between 2 and 8 degrees C (36 and 46 degrees F). Protect from light. Allow to come to room temperature naturally. Do not use artificial heat. If protected from light, the injection may be stored at room temperature between 20 and 30 degrees C (70 and 86 degrees F) for 14 days. After the initial use, throw away any unused portion of a multiple dose vial after 14 days. Throw away unused portions of the ampules after use. NOTE: This sheet is a summary. It may not cover all possible information. If you have questions about this medicine, talk to your doctor, pharmacist, or health care provider.  2020 Elsevier/Gold Standard (2018-08-03 13:33:09)  

## 2019-05-10 NOTE — Progress Notes (Signed)
PATIENT CARE CENTER NOTE  Diagnosis:Acute blood loss anemia (D62)   Provider:Beavers, Joelene Millin, MD   Procedure:IM Sandostatin (Octreotide)   Note:Patient received IM Sandostatin injection in the left hip. Tolerated well. Discharge instructions given. Patient alert, oriented and ambulatory at discharge.

## 2019-05-11 DIAGNOSIS — N2581 Secondary hyperparathyroidism of renal origin: Secondary | ICD-10-CM | POA: Diagnosis not present

## 2019-05-11 DIAGNOSIS — Z992 Dependence on renal dialysis: Secondary | ICD-10-CM | POA: Diagnosis not present

## 2019-05-11 DIAGNOSIS — N186 End stage renal disease: Secondary | ICD-10-CM | POA: Diagnosis not present

## 2019-05-14 DIAGNOSIS — N2581 Secondary hyperparathyroidism of renal origin: Secondary | ICD-10-CM | POA: Diagnosis not present

## 2019-05-14 DIAGNOSIS — N186 End stage renal disease: Secondary | ICD-10-CM | POA: Diagnosis not present

## 2019-05-14 DIAGNOSIS — Z992 Dependence on renal dialysis: Secondary | ICD-10-CM | POA: Diagnosis not present

## 2019-05-16 DIAGNOSIS — N186 End stage renal disease: Secondary | ICD-10-CM | POA: Diagnosis not present

## 2019-05-16 DIAGNOSIS — C3492 Malignant neoplasm of unspecified part of left bronchus or lung: Secondary | ICD-10-CM | POA: Diagnosis not present

## 2019-05-16 DIAGNOSIS — N2581 Secondary hyperparathyroidism of renal origin: Secondary | ICD-10-CM | POA: Diagnosis not present

## 2019-05-16 DIAGNOSIS — Z992 Dependence on renal dialysis: Secondary | ICD-10-CM | POA: Diagnosis not present

## 2019-05-16 DIAGNOSIS — R0600 Dyspnea, unspecified: Secondary | ICD-10-CM | POA: Diagnosis not present

## 2019-05-16 DIAGNOSIS — I5033 Acute on chronic diastolic (congestive) heart failure: Secondary | ICD-10-CM | POA: Diagnosis not present

## 2019-05-18 DIAGNOSIS — N2581 Secondary hyperparathyroidism of renal origin: Secondary | ICD-10-CM | POA: Diagnosis not present

## 2019-05-18 DIAGNOSIS — N186 End stage renal disease: Secondary | ICD-10-CM | POA: Diagnosis not present

## 2019-05-18 DIAGNOSIS — Z992 Dependence on renal dialysis: Secondary | ICD-10-CM | POA: Diagnosis not present

## 2019-05-18 DIAGNOSIS — N032 Chronic nephritic syndrome with diffuse membranous glomerulonephritis: Secondary | ICD-10-CM | POA: Diagnosis not present

## 2019-05-21 DIAGNOSIS — N186 End stage renal disease: Secondary | ICD-10-CM | POA: Diagnosis not present

## 2019-05-21 DIAGNOSIS — N2581 Secondary hyperparathyroidism of renal origin: Secondary | ICD-10-CM | POA: Diagnosis not present

## 2019-05-21 DIAGNOSIS — Z992 Dependence on renal dialysis: Secondary | ICD-10-CM | POA: Diagnosis not present

## 2019-05-23 DIAGNOSIS — N186 End stage renal disease: Secondary | ICD-10-CM | POA: Diagnosis not present

## 2019-05-23 DIAGNOSIS — Z992 Dependence on renal dialysis: Secondary | ICD-10-CM | POA: Diagnosis not present

## 2019-05-23 DIAGNOSIS — N2581 Secondary hyperparathyroidism of renal origin: Secondary | ICD-10-CM | POA: Diagnosis not present

## 2019-05-25 DIAGNOSIS — N186 End stage renal disease: Secondary | ICD-10-CM | POA: Diagnosis not present

## 2019-05-25 DIAGNOSIS — N2581 Secondary hyperparathyroidism of renal origin: Secondary | ICD-10-CM | POA: Diagnosis not present

## 2019-05-25 DIAGNOSIS — Z992 Dependence on renal dialysis: Secondary | ICD-10-CM | POA: Diagnosis not present

## 2019-05-28 DIAGNOSIS — N2581 Secondary hyperparathyroidism of renal origin: Secondary | ICD-10-CM | POA: Diagnosis not present

## 2019-05-28 DIAGNOSIS — N186 End stage renal disease: Secondary | ICD-10-CM | POA: Diagnosis not present

## 2019-05-28 DIAGNOSIS — Z992 Dependence on renal dialysis: Secondary | ICD-10-CM | POA: Diagnosis not present

## 2019-05-30 DIAGNOSIS — N2581 Secondary hyperparathyroidism of renal origin: Secondary | ICD-10-CM | POA: Diagnosis not present

## 2019-05-30 DIAGNOSIS — Z992 Dependence on renal dialysis: Secondary | ICD-10-CM | POA: Diagnosis not present

## 2019-05-30 DIAGNOSIS — N186 End stage renal disease: Secondary | ICD-10-CM | POA: Diagnosis not present

## 2019-06-01 DIAGNOSIS — Z992 Dependence on renal dialysis: Secondary | ICD-10-CM | POA: Diagnosis not present

## 2019-06-01 DIAGNOSIS — N186 End stage renal disease: Secondary | ICD-10-CM | POA: Diagnosis not present

## 2019-06-01 DIAGNOSIS — N2581 Secondary hyperparathyroidism of renal origin: Secondary | ICD-10-CM | POA: Diagnosis not present

## 2019-06-04 DIAGNOSIS — N2581 Secondary hyperparathyroidism of renal origin: Secondary | ICD-10-CM | POA: Diagnosis not present

## 2019-06-04 DIAGNOSIS — Z992 Dependence on renal dialysis: Secondary | ICD-10-CM | POA: Diagnosis not present

## 2019-06-04 DIAGNOSIS — N186 End stage renal disease: Secondary | ICD-10-CM | POA: Diagnosis not present

## 2019-06-05 ENCOUNTER — Ambulatory Visit (HOSPITAL_COMMUNITY)
Admission: RE | Admit: 2019-06-05 | Discharge: 2019-06-05 | Disposition: A | Discharge status: Home / Self Care | Payer: Medicare PPO | Source: Ambulatory Visit | Attending: Internal Medicine | Admitting: Internal Medicine

## 2019-06-05 ENCOUNTER — Other Ambulatory Visit: Payer: Self-pay

## 2019-06-05 DIAGNOSIS — D62 Acute posthemorrhagic anemia: Secondary | ICD-10-CM | POA: Diagnosis not present

## 2019-06-05 MED ORDER — OCTREOTIDE ACETATE 20 MG IM KIT
20.0000 mg | PACK | Freq: Once | INTRAMUSCULAR | Status: AC
  Administered 2019-06-05: 20 mg via INTRAMUSCULAR
  Filled 2019-06-05: qty 1

## 2019-06-05 NOTE — Progress Notes (Signed)
Patient received IM Sandostatin (Ocreotide) in the left hip as ordered by [name], MD. Tolerated well. Discharge instructions given. Patient alert, oriented and ambulatory at discharge.

## 2019-06-05 NOTE — Discharge Instructions (Signed)
Octreotide injection solution What is this medicine? OCTREOTIDE (ok TREE oh tide) is used to reduce blood levels of growth hormone in patients with a condition called acromegaly. This medicine also reduces flushing and watery diarrhea caused by certain types of cancer. This medicine may be used for other purposes; ask your health care provider or pharmacist if you have questions. COMMON BRAND NAME(S): Bynfezia, Sandostatin What should I tell my health care provider before I take this medicine? They need to know if you have any of these conditions:  diabetes  gallbladder disease  kidney disease  liver disease  thyroid disease  an unusual or allergic reaction to octreotide, other medicines, foods, dyes, or preservatives  pregnant or trying to get pregnant  breast-feeding How should I use this medicine? This medicine is for injection under the skin or into a vein (only in emergency situations). It is usually given by a health care professional in a hospital or clinic setting. If you get this medicine at home, you will be taught how to prepare and give this medicine. Allow the injection solution to come to room temperature before use. Do not warm it artificially. Use exactly as directed. Take your medicine at regular intervals. Do not take your medicine more often than directed. It is important that you put your used needles and syringes in a special sharps container. Do not put them in a trash can. If you do not have a sharps container, call your pharmacist or healthcare provider to get one. Talk to your pediatrician regarding the use of this medicine in children. Special care may be needed. Overdosage: If you think you have taken too much of this medicine contact a poison control center or emergency room at once. NOTE: This medicine is only for you. Do not share this medicine with others. What if I miss a dose? If you miss a dose, take it as soon as you can. If it is almost time for your  next dose, take only that dose. Do not take double or extra doses. What may interact with this medicine?  bromocriptine  certain medicines for blood pressure, heart disease, irregular heartbeat  cyclosporine  diuretics  medicines for diabetes, including insulin  quinidine This list may not describe all possible interactions. Give your health care provider a list of all the medicines, herbs, non-prescription drugs, or dietary supplements you use. Also tell them if you smoke, drink alcohol, or use illegal drugs. Some items may interact with your medicine. What should I watch for while using this medicine? Visit your doctor or health care professional for regular checks on your progress. To help reduce irritation at the injection site, use a different site for each injection and make sure the solution is at room temperature before use. This medicine may cause decreases in blood sugar. Signs of low blood sugar include chills, cool, pale skin or cold sweats, drowsiness, extreme hunger, fast heartbeat, headache, nausea, nervousness or anxiety, shakiness, trembling, unsteadiness, tiredness, or weakness. Contact your doctor or health care professional right away if you experience any of these symptoms. This medicine may increase blood sugar. Ask your healthcare provider if changes in diet or medicines are needed if you have diabetes. This medicine may cause a decrease in vitamin B12. You should make sure that you get enough vitamin B12 while you are taking this medicine. Discuss the foods you eat and the vitamins you take with your health care professional. What side effects may I notice from receiving this medicine? Side   effects that you should report to your doctor or health care professional as soon as possible:  allergic reactions like skin rash, itching or hives, swelling of the face, lips, or tongue  fast, slow, or irregular heartbeat  right upper belly pain  severe stomach pain  signs  and symptoms of high blood sugar such as being more thirsty or hungry or having to urinate more than normal. You may also feel very tired or have blurry vision.  signs and symptoms of low blood sugar such as feeling anxious; confusion; dizziness; increased hunger; unusually weak or tired; increased sweating; shakiness; cold, clammy skin; irritable; headache; blurred vision; fast heartbeat; loss of consciousness  unusually weak or tired Side effects that usually do not require medical attention (report to your doctor or health care professional if they continue or are bothersome):  diarrhea  dizziness  gas  headache  nausea, vomiting  pain, redness, or irritation at site where injected  upset stomach This list may not describe all possible side effects. Call your doctor for medical advice about side effects. You may report side effects to FDA at 1-800-FDA-1088. Where should I keep my medicine? Keep out of the reach of children. Store in a refrigerator between 2 and 8 degrees C (36 and 46 degrees F). Protect from light. Allow to come to room temperature naturally. Do not use artificial heat. If protected from light, the injection may be stored at room temperature between 20 and 30 degrees C (70 and 86 degrees F) for 14 days. After the initial use, throw away any unused portion of a multiple dose vial after 14 days. Throw away unused portions of the ampules after use. NOTE: This sheet is a summary. It may not cover all possible information. If you have questions about this medicine, talk to your doctor, pharmacist, or health care provider.  2020 Elsevier/Gold Standard (2018-08-03 13:33:09)  

## 2019-06-06 DIAGNOSIS — N186 End stage renal disease: Secondary | ICD-10-CM | POA: Diagnosis not present

## 2019-06-06 DIAGNOSIS — Z992 Dependence on renal dialysis: Secondary | ICD-10-CM | POA: Diagnosis not present

## 2019-06-06 DIAGNOSIS — N2581 Secondary hyperparathyroidism of renal origin: Secondary | ICD-10-CM | POA: Diagnosis not present

## 2019-06-08 DIAGNOSIS — Z992 Dependence on renal dialysis: Secondary | ICD-10-CM | POA: Diagnosis not present

## 2019-06-08 DIAGNOSIS — N186 End stage renal disease: Secondary | ICD-10-CM | POA: Diagnosis not present

## 2019-06-08 DIAGNOSIS — N2581 Secondary hyperparathyroidism of renal origin: Secondary | ICD-10-CM | POA: Diagnosis not present

## 2019-06-11 DIAGNOSIS — N186 End stage renal disease: Secondary | ICD-10-CM | POA: Diagnosis not present

## 2019-06-11 DIAGNOSIS — Z992 Dependence on renal dialysis: Secondary | ICD-10-CM | POA: Diagnosis not present

## 2019-06-11 DIAGNOSIS — N2581 Secondary hyperparathyroidism of renal origin: Secondary | ICD-10-CM | POA: Diagnosis not present

## 2019-06-13 DIAGNOSIS — N186 End stage renal disease: Secondary | ICD-10-CM | POA: Diagnosis not present

## 2019-06-13 DIAGNOSIS — Z992 Dependence on renal dialysis: Secondary | ICD-10-CM | POA: Diagnosis not present

## 2019-06-13 DIAGNOSIS — N2581 Secondary hyperparathyroidism of renal origin: Secondary | ICD-10-CM | POA: Diagnosis not present

## 2019-06-15 DIAGNOSIS — R0600 Dyspnea, unspecified: Secondary | ICD-10-CM | POA: Diagnosis not present

## 2019-06-15 DIAGNOSIS — Z992 Dependence on renal dialysis: Secondary | ICD-10-CM | POA: Diagnosis not present

## 2019-06-15 DIAGNOSIS — N2581 Secondary hyperparathyroidism of renal origin: Secondary | ICD-10-CM | POA: Diagnosis not present

## 2019-06-15 DIAGNOSIS — N186 End stage renal disease: Secondary | ICD-10-CM | POA: Diagnosis not present

## 2019-06-15 DIAGNOSIS — I5033 Acute on chronic diastolic (congestive) heart failure: Secondary | ICD-10-CM | POA: Diagnosis not present

## 2019-06-15 DIAGNOSIS — C3492 Malignant neoplasm of unspecified part of left bronchus or lung: Secondary | ICD-10-CM | POA: Diagnosis not present

## 2019-06-18 DIAGNOSIS — N032 Chronic nephritic syndrome with diffuse membranous glomerulonephritis: Secondary | ICD-10-CM | POA: Diagnosis not present

## 2019-06-18 DIAGNOSIS — N186 End stage renal disease: Secondary | ICD-10-CM | POA: Diagnosis not present

## 2019-06-18 DIAGNOSIS — N2581 Secondary hyperparathyroidism of renal origin: Secondary | ICD-10-CM | POA: Diagnosis not present

## 2019-06-18 DIAGNOSIS — Z992 Dependence on renal dialysis: Secondary | ICD-10-CM | POA: Diagnosis not present

## 2019-06-20 DIAGNOSIS — N186 End stage renal disease: Secondary | ICD-10-CM | POA: Diagnosis not present

## 2019-06-20 DIAGNOSIS — N2581 Secondary hyperparathyroidism of renal origin: Secondary | ICD-10-CM | POA: Diagnosis not present

## 2019-06-20 DIAGNOSIS — Z992 Dependence on renal dialysis: Secondary | ICD-10-CM | POA: Diagnosis not present

## 2019-06-22 DIAGNOSIS — N2581 Secondary hyperparathyroidism of renal origin: Secondary | ICD-10-CM | POA: Diagnosis not present

## 2019-06-22 DIAGNOSIS — N186 End stage renal disease: Secondary | ICD-10-CM | POA: Diagnosis not present

## 2019-06-22 DIAGNOSIS — Z992 Dependence on renal dialysis: Secondary | ICD-10-CM | POA: Diagnosis not present

## 2019-06-25 DIAGNOSIS — Z992 Dependence on renal dialysis: Secondary | ICD-10-CM | POA: Diagnosis not present

## 2019-06-25 DIAGNOSIS — N2581 Secondary hyperparathyroidism of renal origin: Secondary | ICD-10-CM | POA: Diagnosis not present

## 2019-06-25 DIAGNOSIS — N186 End stage renal disease: Secondary | ICD-10-CM | POA: Diagnosis not present

## 2019-06-27 DIAGNOSIS — N186 End stage renal disease: Secondary | ICD-10-CM | POA: Diagnosis not present

## 2019-06-27 DIAGNOSIS — N2581 Secondary hyperparathyroidism of renal origin: Secondary | ICD-10-CM | POA: Diagnosis not present

## 2019-06-27 DIAGNOSIS — Z992 Dependence on renal dialysis: Secondary | ICD-10-CM | POA: Diagnosis not present

## 2019-06-29 DIAGNOSIS — Z992 Dependence on renal dialysis: Secondary | ICD-10-CM | POA: Diagnosis not present

## 2019-06-29 DIAGNOSIS — N2581 Secondary hyperparathyroidism of renal origin: Secondary | ICD-10-CM | POA: Diagnosis not present

## 2019-06-29 DIAGNOSIS — N186 End stage renal disease: Secondary | ICD-10-CM | POA: Diagnosis not present

## 2019-07-02 DIAGNOSIS — N2581 Secondary hyperparathyroidism of renal origin: Secondary | ICD-10-CM | POA: Diagnosis not present

## 2019-07-02 DIAGNOSIS — N186 End stage renal disease: Secondary | ICD-10-CM | POA: Diagnosis not present

## 2019-07-02 DIAGNOSIS — Z992 Dependence on renal dialysis: Secondary | ICD-10-CM | POA: Diagnosis not present

## 2019-07-04 DIAGNOSIS — N2581 Secondary hyperparathyroidism of renal origin: Secondary | ICD-10-CM | POA: Diagnosis not present

## 2019-07-04 DIAGNOSIS — Z992 Dependence on renal dialysis: Secondary | ICD-10-CM | POA: Diagnosis not present

## 2019-07-04 DIAGNOSIS — N186 End stage renal disease: Secondary | ICD-10-CM | POA: Diagnosis not present

## 2019-07-05 ENCOUNTER — Ambulatory Visit (HOSPITAL_COMMUNITY)
Admission: RE | Admit: 2019-07-05 | Discharge: 2019-07-05 | Disposition: A | Discharge status: Home / Self Care | Payer: Medicare PPO | Source: Ambulatory Visit | Attending: Internal Medicine | Admitting: Internal Medicine

## 2019-07-05 ENCOUNTER — Other Ambulatory Visit: Payer: Self-pay

## 2019-07-05 DIAGNOSIS — Q2730 Arteriovenous malformation, site unspecified: Secondary | ICD-10-CM | POA: Diagnosis not present

## 2019-07-05 DIAGNOSIS — D500 Iron deficiency anemia secondary to blood loss (chronic): Secondary | ICD-10-CM | POA: Diagnosis not present

## 2019-07-05 MED ORDER — OCTREOTIDE ACETATE 20 MG IM KIT
20.0000 mg | PACK | Freq: Once | INTRAMUSCULAR | Status: AC
  Administered 2019-07-05: 20 mg via INTRAMUSCULAR
  Filled 2019-07-05: qty 1

## 2019-07-05 NOTE — Discharge Instructions (Signed)
Octreotide injection solution What is this medicine? OCTREOTIDE (ok TREE oh tide) is used to reduce blood levels of growth hormone in patients with a condition called acromegaly. This medicine also reduces flushing and watery diarrhea caused by certain types of cancer. This medicine may be used for other purposes; ask your health care provider or pharmacist if you have questions. COMMON BRAND NAME(S): Bynfezia, Sandostatin What should I tell my health care provider before I take this medicine? They need to know if you have any of these conditions:  diabetes  gallbladder disease  kidney disease  liver disease  thyroid disease  an unusual or allergic reaction to octreotide, other medicines, foods, dyes, or preservatives  pregnant or trying to get pregnant  breast-feeding How should I use this medicine? This medicine is for injection under the skin or into a vein (only in emergency situations). It is usually given by a health care professional in a hospital or clinic setting. If you get this medicine at home, you will be taught how to prepare and give this medicine. Allow the injection solution to come to room temperature before use. Do not warm it artificially. Use exactly as directed. Take your medicine at regular intervals. Do not take your medicine more often than directed. It is important that you put your used needles and syringes in a special sharps container. Do not put them in a trash can. If you do not have a sharps container, call your pharmacist or healthcare provider to get one. Talk to your pediatrician regarding the use of this medicine in children. Special care may be needed. Overdosage: If you think you have taken too much of this medicine contact a poison control center or emergency room at once. NOTE: This medicine is only for you. Do not share this medicine with others. What if I miss a dose? If you miss a dose, take it as soon as you can. If it is almost time for your  next dose, take only that dose. Do not take double or extra doses. What may interact with this medicine?  bromocriptine  certain medicines for blood pressure, heart disease, irregular heartbeat  cyclosporine  diuretics  medicines for diabetes, including insulin  quinidine This list may not describe all possible interactions. Give your health care provider a list of all the medicines, herbs, non-prescription drugs, or dietary supplements you use. Also tell them if you smoke, drink alcohol, or use illegal drugs. Some items may interact with your medicine. What should I watch for while using this medicine? Visit your doctor or health care professional for regular checks on your progress. To help reduce irritation at the injection site, use a different site for each injection and make sure the solution is at room temperature before use. This medicine may cause decreases in blood sugar. Signs of low blood sugar include chills, cool, pale skin or cold sweats, drowsiness, extreme hunger, fast heartbeat, headache, nausea, nervousness or anxiety, shakiness, trembling, unsteadiness, tiredness, or weakness. Contact your doctor or health care professional right away if you experience any of these symptoms. This medicine may increase blood sugar. Ask your healthcare provider if changes in diet or medicines are needed if you have diabetes. This medicine may cause a decrease in vitamin B12. You should make sure that you get enough vitamin B12 while you are taking this medicine. Discuss the foods you eat and the vitamins you take with your health care professional. What side effects may I notice from receiving this medicine? Side   effects that you should report to your doctor or health care professional as soon as possible:  allergic reactions like skin rash, itching or hives, swelling of the face, lips, or tongue  fast, slow, or irregular heartbeat  right upper belly pain  severe stomach pain  signs  and symptoms of high blood sugar such as being more thirsty or hungry or having to urinate more than normal. You may also feel very tired or have blurry vision.  signs and symptoms of low blood sugar such as feeling anxious; confusion; dizziness; increased hunger; unusually weak or tired; increased sweating; shakiness; cold, clammy skin; irritable; headache; blurred vision; fast heartbeat; loss of consciousness  unusually weak or tired Side effects that usually do not require medical attention (report to your doctor or health care professional if they continue or are bothersome):  diarrhea  dizziness  gas  headache  nausea, vomiting  pain, redness, or irritation at site where injected  upset stomach This list may not describe all possible side effects. Call your doctor for medical advice about side effects. You may report side effects to FDA at 1-800-FDA-1088. Where should I keep my medicine? Keep out of the reach of children. Store in a refrigerator between 2 and 8 degrees C (36 and 46 degrees F). Protect from light. Allow to come to room temperature naturally. Do not use artificial heat. If protected from light, the injection may be stored at room temperature between 20 and 30 degrees C (70 and 86 degrees F) for 14 days. After the initial use, throw away any unused portion of a multiple dose vial after 14 days. Throw away unused portions of the ampules after use. NOTE: This sheet is a summary. It may not cover all possible information. If you have questions about this medicine, talk to your doctor, pharmacist, or health care provider.  2020 Elsevier/Gold Standard (2018-08-03 13:33:09)  

## 2019-07-05 NOTE — Progress Notes (Signed)
PATIENT CARE CENTER NOTE  Diagnosis:Acute blood loss anemia (D62)   Provider:Beavers, Joelene Millin, MD   Procedure:IM Sandostatin (Octreotide)   Note:Patient received IM Sandostatin injection in the left hip. Tolerated well. Discharge instructions given. Patient alert, oriented and ambulatory at discharge.        Note Details

## 2019-07-06 DIAGNOSIS — N186 End stage renal disease: Secondary | ICD-10-CM | POA: Diagnosis not present

## 2019-07-06 DIAGNOSIS — N2581 Secondary hyperparathyroidism of renal origin: Secondary | ICD-10-CM | POA: Diagnosis not present

## 2019-07-06 DIAGNOSIS — Z992 Dependence on renal dialysis: Secondary | ICD-10-CM | POA: Diagnosis not present

## 2019-07-09 DIAGNOSIS — Z992 Dependence on renal dialysis: Secondary | ICD-10-CM | POA: Diagnosis not present

## 2019-07-09 DIAGNOSIS — N186 End stage renal disease: Secondary | ICD-10-CM | POA: Diagnosis not present

## 2019-07-09 DIAGNOSIS — N2581 Secondary hyperparathyroidism of renal origin: Secondary | ICD-10-CM | POA: Diagnosis not present

## 2019-07-11 DIAGNOSIS — Z992 Dependence on renal dialysis: Secondary | ICD-10-CM | POA: Diagnosis not present

## 2019-07-11 DIAGNOSIS — N186 End stage renal disease: Secondary | ICD-10-CM | POA: Diagnosis not present

## 2019-07-11 DIAGNOSIS — N2581 Secondary hyperparathyroidism of renal origin: Secondary | ICD-10-CM | POA: Diagnosis not present

## 2019-07-13 DIAGNOSIS — Z992 Dependence on renal dialysis: Secondary | ICD-10-CM | POA: Diagnosis not present

## 2019-07-13 DIAGNOSIS — N186 End stage renal disease: Secondary | ICD-10-CM | POA: Diagnosis not present

## 2019-07-13 DIAGNOSIS — N2581 Secondary hyperparathyroidism of renal origin: Secondary | ICD-10-CM | POA: Diagnosis not present

## 2019-07-16 DIAGNOSIS — R0600 Dyspnea, unspecified: Secondary | ICD-10-CM | POA: Diagnosis not present

## 2019-07-16 DIAGNOSIS — N2581 Secondary hyperparathyroidism of renal origin: Secondary | ICD-10-CM | POA: Diagnosis not present

## 2019-07-16 DIAGNOSIS — I5033 Acute on chronic diastolic (congestive) heart failure: Secondary | ICD-10-CM | POA: Diagnosis not present

## 2019-07-16 DIAGNOSIS — N186 End stage renal disease: Secondary | ICD-10-CM | POA: Diagnosis not present

## 2019-07-16 DIAGNOSIS — C3492 Malignant neoplasm of unspecified part of left bronchus or lung: Secondary | ICD-10-CM | POA: Diagnosis not present

## 2019-07-16 DIAGNOSIS — Z992 Dependence on renal dialysis: Secondary | ICD-10-CM | POA: Diagnosis not present

## 2019-07-18 DIAGNOSIS — N186 End stage renal disease: Secondary | ICD-10-CM | POA: Diagnosis not present

## 2019-07-18 DIAGNOSIS — N032 Chronic nephritic syndrome with diffuse membranous glomerulonephritis: Secondary | ICD-10-CM | POA: Diagnosis not present

## 2019-07-18 DIAGNOSIS — Z992 Dependence on renal dialysis: Secondary | ICD-10-CM | POA: Diagnosis not present

## 2019-07-18 DIAGNOSIS — N2581 Secondary hyperparathyroidism of renal origin: Secondary | ICD-10-CM | POA: Diagnosis not present

## 2019-07-20 DIAGNOSIS — Z992 Dependence on renal dialysis: Secondary | ICD-10-CM | POA: Diagnosis not present

## 2019-07-20 DIAGNOSIS — N186 End stage renal disease: Secondary | ICD-10-CM | POA: Diagnosis not present

## 2019-07-20 DIAGNOSIS — N2581 Secondary hyperparathyroidism of renal origin: Secondary | ICD-10-CM | POA: Diagnosis not present

## 2019-07-23 DIAGNOSIS — Z992 Dependence on renal dialysis: Secondary | ICD-10-CM | POA: Diagnosis not present

## 2019-07-23 DIAGNOSIS — N186 End stage renal disease: Secondary | ICD-10-CM | POA: Diagnosis not present

## 2019-07-23 DIAGNOSIS — N2581 Secondary hyperparathyroidism of renal origin: Secondary | ICD-10-CM | POA: Diagnosis not present

## 2019-07-25 DIAGNOSIS — Z992 Dependence on renal dialysis: Secondary | ICD-10-CM | POA: Diagnosis not present

## 2019-07-25 DIAGNOSIS — N2581 Secondary hyperparathyroidism of renal origin: Secondary | ICD-10-CM | POA: Diagnosis not present

## 2019-07-25 DIAGNOSIS — N186 End stage renal disease: Secondary | ICD-10-CM | POA: Diagnosis not present

## 2019-07-27 DIAGNOSIS — N186 End stage renal disease: Secondary | ICD-10-CM | POA: Diagnosis not present

## 2019-07-27 DIAGNOSIS — Z992 Dependence on renal dialysis: Secondary | ICD-10-CM | POA: Diagnosis not present

## 2019-07-27 DIAGNOSIS — N2581 Secondary hyperparathyroidism of renal origin: Secondary | ICD-10-CM | POA: Diagnosis not present

## 2019-07-29 ENCOUNTER — Other Ambulatory Visit: Payer: Self-pay | Admitting: Gastroenterology

## 2019-07-30 DIAGNOSIS — Z992 Dependence on renal dialysis: Secondary | ICD-10-CM | POA: Diagnosis not present

## 2019-07-30 DIAGNOSIS — N2581 Secondary hyperparathyroidism of renal origin: Secondary | ICD-10-CM | POA: Diagnosis not present

## 2019-07-30 DIAGNOSIS — N186 End stage renal disease: Secondary | ICD-10-CM | POA: Diagnosis not present

## 2019-08-01 DIAGNOSIS — Z992 Dependence on renal dialysis: Secondary | ICD-10-CM | POA: Diagnosis not present

## 2019-08-01 DIAGNOSIS — N2581 Secondary hyperparathyroidism of renal origin: Secondary | ICD-10-CM | POA: Diagnosis not present

## 2019-08-01 DIAGNOSIS — N186 End stage renal disease: Secondary | ICD-10-CM | POA: Diagnosis not present

## 2019-08-02 ENCOUNTER — Ambulatory Visit (HOSPITAL_COMMUNITY)
Admission: RE | Admit: 2019-08-02 | Discharge: 2019-08-02 | Disposition: A | Discharge status: Home / Self Care | Payer: Medicare PPO | Source: Ambulatory Visit | Attending: Internal Medicine | Admitting: Internal Medicine

## 2019-08-02 ENCOUNTER — Other Ambulatory Visit: Payer: Self-pay

## 2019-08-02 DIAGNOSIS — D500 Iron deficiency anemia secondary to blood loss (chronic): Secondary | ICD-10-CM | POA: Insufficient documentation

## 2019-08-02 MED ORDER — OCTREOTIDE ACETATE 20 MG IM KIT
20.0000 mg | PACK | Freq: Once | INTRAMUSCULAR | Status: AC
  Administered 2019-08-02: 20 mg via INTRAMUSCULAR
  Filled 2019-08-02: qty 1

## 2019-08-02 NOTE — Progress Notes (Signed)
PATIENT CARE CENTER NOTE  Diagnosis: Acute blood loss anemia   Provider: [name], MD   Procedure: IM Sandostatin   Note: Patient received IM Sandostatin injection in the left hip.Tolerated well. Discharge instructions given. Patient alert, oriented and ambulatory on discharge.

## 2019-08-02 NOTE — Discharge Instructions (Signed)
Octreotide injection solution What is this medicine? OCTREOTIDE (ok TREE oh tide) is used to reduce blood levels of growth hormone in patients with a condition called acromegaly. This medicine also reduces flushing and watery diarrhea caused by certain types of cancer. This medicine may be used for other purposes; ask your health care provider or pharmacist if you have questions. COMMON BRAND NAME(S): Bynfezia, Sandostatin What should I tell my health care provider before I take this medicine? They need to know if you have any of these conditions:  diabetes  gallbladder disease  kidney disease  liver disease  thyroid disease  an unusual or allergic reaction to octreotide, other medicines, foods, dyes, or preservatives  pregnant or trying to get pregnant  breast-feeding How should I use this medicine? This medicine is for injection under the skin or into a vein (only in emergency situations). It is usually given by a health care professional in a hospital or clinic setting. If you get this medicine at home, you will be taught how to prepare and give this medicine. Allow the injection solution to come to room temperature before use. Do not warm it artificially. Use exactly as directed. Take your medicine at regular intervals. Do not take your medicine more often than directed. It is important that you put your used needles and syringes in a special sharps container. Do not put them in a trash can. If you do not have a sharps container, call your pharmacist or healthcare provider to get one. Talk to your pediatrician regarding the use of this medicine in children. Special care may be needed. Overdosage: If you think you have taken too much of this medicine contact a poison control center or emergency room at once. NOTE: This medicine is only for you. Do not share this medicine with others. What if I miss a dose? If you miss a dose, take it as soon as you can. If it is almost time for your  next dose, take only that dose. Do not take double or extra doses. What may interact with this medicine?  bromocriptine  certain medicines for blood pressure, heart disease, irregular heartbeat  cyclosporine  diuretics  medicines for diabetes, including insulin  quinidine This list may not describe all possible interactions. Give your health care provider a list of all the medicines, herbs, non-prescription drugs, or dietary supplements you use. Also tell them if you smoke, drink alcohol, or use illegal drugs. Some items may interact with your medicine. What should I watch for while using this medicine? Visit your doctor or health care professional for regular checks on your progress. To help reduce irritation at the injection site, use a different site for each injection and make sure the solution is at room temperature before use. This medicine may cause decreases in blood sugar. Signs of low blood sugar include chills, cool, pale skin or cold sweats, drowsiness, extreme hunger, fast heartbeat, headache, nausea, nervousness or anxiety, shakiness, trembling, unsteadiness, tiredness, or weakness. Contact your doctor or health care professional right away if you experience any of these symptoms. This medicine may increase blood sugar. Ask your healthcare provider if changes in diet or medicines are needed if you have diabetes. This medicine may cause a decrease in vitamin B12. You should make sure that you get enough vitamin B12 while you are taking this medicine. Discuss the foods you eat and the vitamins you take with your health care professional. What side effects may I notice from receiving this medicine? Side   effects that you should report to your doctor or health care professional as soon as possible:  allergic reactions like skin rash, itching or hives, swelling of the face, lips, or tongue  fast, slow, or irregular heartbeat  right upper belly pain  severe stomach pain  signs  and symptoms of high blood sugar such as being more thirsty or hungry or having to urinate more than normal. You may also feel very tired or have blurry vision.  signs and symptoms of low blood sugar such as feeling anxious; confusion; dizziness; increased hunger; unusually weak or tired; increased sweating; shakiness; cold, clammy skin; irritable; headache; blurred vision; fast heartbeat; loss of consciousness  unusually weak or tired Side effects that usually do not require medical attention (report to your doctor or health care professional if they continue or are bothersome):  diarrhea  dizziness  gas  headache  nausea, vomiting  pain, redness, or irritation at site where injected  upset stomach This list may not describe all possible side effects. Call your doctor for medical advice about side effects. You may report side effects to FDA at 1-800-FDA-1088. Where should I keep my medicine? Keep out of the reach of children. Store in a refrigerator between 2 and 8 degrees C (36 and 46 degrees F). Protect from light. Allow to come to room temperature naturally. Do not use artificial heat. If protected from light, the injection may be stored at room temperature between 20 and 30 degrees C (70 and 86 degrees F) for 14 days. After the initial use, throw away any unused portion of a multiple dose vial after 14 days. Throw away unused portions of the ampules after use. NOTE: This sheet is a summary. It may not cover all possible information. If you have questions about this medicine, talk to your doctor, pharmacist, or health care provider.  2020 Elsevier/Gold Standard (2018-08-03 13:33:09)  

## 2019-08-03 DIAGNOSIS — N2581 Secondary hyperparathyroidism of renal origin: Secondary | ICD-10-CM | POA: Diagnosis not present

## 2019-08-03 DIAGNOSIS — N186 End stage renal disease: Secondary | ICD-10-CM | POA: Diagnosis not present

## 2019-08-03 DIAGNOSIS — Z992 Dependence on renal dialysis: Secondary | ICD-10-CM | POA: Diagnosis not present

## 2019-08-06 DIAGNOSIS — N2581 Secondary hyperparathyroidism of renal origin: Secondary | ICD-10-CM | POA: Diagnosis not present

## 2019-08-06 DIAGNOSIS — N186 End stage renal disease: Secondary | ICD-10-CM | POA: Diagnosis not present

## 2019-08-06 DIAGNOSIS — Z992 Dependence on renal dialysis: Secondary | ICD-10-CM | POA: Diagnosis not present

## 2019-08-08 DIAGNOSIS — N186 End stage renal disease: Secondary | ICD-10-CM | POA: Diagnosis not present

## 2019-08-08 DIAGNOSIS — N2581 Secondary hyperparathyroidism of renal origin: Secondary | ICD-10-CM | POA: Diagnosis not present

## 2019-08-08 DIAGNOSIS — Z992 Dependence on renal dialysis: Secondary | ICD-10-CM | POA: Diagnosis not present

## 2019-08-10 DIAGNOSIS — Z992 Dependence on renal dialysis: Secondary | ICD-10-CM | POA: Diagnosis not present

## 2019-08-10 DIAGNOSIS — N2581 Secondary hyperparathyroidism of renal origin: Secondary | ICD-10-CM | POA: Diagnosis not present

## 2019-08-10 DIAGNOSIS — N186 End stage renal disease: Secondary | ICD-10-CM | POA: Diagnosis not present

## 2019-08-13 DIAGNOSIS — N186 End stage renal disease: Secondary | ICD-10-CM | POA: Diagnosis not present

## 2019-08-13 DIAGNOSIS — Z992 Dependence on renal dialysis: Secondary | ICD-10-CM | POA: Diagnosis not present

## 2019-08-13 DIAGNOSIS — N2581 Secondary hyperparathyroidism of renal origin: Secondary | ICD-10-CM | POA: Diagnosis not present

## 2019-08-15 DIAGNOSIS — R0600 Dyspnea, unspecified: Secondary | ICD-10-CM | POA: Diagnosis not present

## 2019-08-15 DIAGNOSIS — C3492 Malignant neoplasm of unspecified part of left bronchus or lung: Secondary | ICD-10-CM | POA: Diagnosis not present

## 2019-08-15 DIAGNOSIS — N186 End stage renal disease: Secondary | ICD-10-CM | POA: Diagnosis not present

## 2019-08-15 DIAGNOSIS — Z992 Dependence on renal dialysis: Secondary | ICD-10-CM | POA: Diagnosis not present

## 2019-08-15 DIAGNOSIS — N2581 Secondary hyperparathyroidism of renal origin: Secondary | ICD-10-CM | POA: Diagnosis not present

## 2019-08-15 DIAGNOSIS — I5033 Acute on chronic diastolic (congestive) heart failure: Secondary | ICD-10-CM | POA: Diagnosis not present

## 2019-08-17 DIAGNOSIS — N2581 Secondary hyperparathyroidism of renal origin: Secondary | ICD-10-CM | POA: Diagnosis not present

## 2019-08-17 DIAGNOSIS — N186 End stage renal disease: Secondary | ICD-10-CM | POA: Diagnosis not present

## 2019-08-17 DIAGNOSIS — Z992 Dependence on renal dialysis: Secondary | ICD-10-CM | POA: Diagnosis not present

## 2019-08-18 DIAGNOSIS — N032 Chronic nephritic syndrome with diffuse membranous glomerulonephritis: Secondary | ICD-10-CM | POA: Diagnosis not present

## 2019-08-18 DIAGNOSIS — N186 End stage renal disease: Secondary | ICD-10-CM | POA: Diagnosis not present

## 2019-08-18 DIAGNOSIS — Z992 Dependence on renal dialysis: Secondary | ICD-10-CM | POA: Diagnosis not present

## 2019-08-20 DIAGNOSIS — Z992 Dependence on renal dialysis: Secondary | ICD-10-CM | POA: Diagnosis not present

## 2019-08-20 DIAGNOSIS — N2581 Secondary hyperparathyroidism of renal origin: Secondary | ICD-10-CM | POA: Diagnosis not present

## 2019-08-20 DIAGNOSIS — N186 End stage renal disease: Secondary | ICD-10-CM | POA: Diagnosis not present

## 2019-08-22 DIAGNOSIS — N186 End stage renal disease: Secondary | ICD-10-CM | POA: Diagnosis not present

## 2019-08-22 DIAGNOSIS — N2581 Secondary hyperparathyroidism of renal origin: Secondary | ICD-10-CM | POA: Diagnosis not present

## 2019-08-22 DIAGNOSIS — Z992 Dependence on renal dialysis: Secondary | ICD-10-CM | POA: Diagnosis not present

## 2019-08-24 DIAGNOSIS — N186 End stage renal disease: Secondary | ICD-10-CM | POA: Diagnosis not present

## 2019-08-24 DIAGNOSIS — N2581 Secondary hyperparathyroidism of renal origin: Secondary | ICD-10-CM | POA: Diagnosis not present

## 2019-08-24 DIAGNOSIS — Z992 Dependence on renal dialysis: Secondary | ICD-10-CM | POA: Diagnosis not present

## 2019-08-27 DIAGNOSIS — N2581 Secondary hyperparathyroidism of renal origin: Secondary | ICD-10-CM | POA: Diagnosis not present

## 2019-08-27 DIAGNOSIS — Z992 Dependence on renal dialysis: Secondary | ICD-10-CM | POA: Diagnosis not present

## 2019-08-27 DIAGNOSIS — N186 End stage renal disease: Secondary | ICD-10-CM | POA: Diagnosis not present

## 2019-08-29 DIAGNOSIS — N2581 Secondary hyperparathyroidism of renal origin: Secondary | ICD-10-CM | POA: Diagnosis not present

## 2019-08-29 DIAGNOSIS — N186 End stage renal disease: Secondary | ICD-10-CM | POA: Diagnosis not present

## 2019-08-29 DIAGNOSIS — Z992 Dependence on renal dialysis: Secondary | ICD-10-CM | POA: Diagnosis not present

## 2019-08-31 DIAGNOSIS — Z992 Dependence on renal dialysis: Secondary | ICD-10-CM | POA: Diagnosis not present

## 2019-08-31 DIAGNOSIS — N186 End stage renal disease: Secondary | ICD-10-CM | POA: Diagnosis not present

## 2019-08-31 DIAGNOSIS — N2581 Secondary hyperparathyroidism of renal origin: Secondary | ICD-10-CM | POA: Diagnosis not present

## 2019-09-03 DIAGNOSIS — N186 End stage renal disease: Secondary | ICD-10-CM | POA: Diagnosis not present

## 2019-09-03 DIAGNOSIS — Z992 Dependence on renal dialysis: Secondary | ICD-10-CM | POA: Diagnosis not present

## 2019-09-03 DIAGNOSIS — N2581 Secondary hyperparathyroidism of renal origin: Secondary | ICD-10-CM | POA: Diagnosis not present

## 2019-09-04 ENCOUNTER — Other Ambulatory Visit: Payer: Self-pay

## 2019-09-04 ENCOUNTER — Ambulatory Visit (HOSPITAL_COMMUNITY)
Admission: RE | Admit: 2019-09-04 | Discharge: 2019-09-04 | Disposition: A | Discharge status: Home / Self Care | Payer: Medicare PPO | Source: Ambulatory Visit | Attending: Internal Medicine | Admitting: Internal Medicine

## 2019-09-04 DIAGNOSIS — Q2730 Arteriovenous malformation, site unspecified: Secondary | ICD-10-CM | POA: Diagnosis not present

## 2019-09-04 DIAGNOSIS — D500 Iron deficiency anemia secondary to blood loss (chronic): Secondary | ICD-10-CM | POA: Insufficient documentation

## 2019-09-04 MED ORDER — OCTREOTIDE ACETATE 20 MG IM KIT
20.0000 mg | PACK | Freq: Once | INTRAMUSCULAR | Status: AC
  Administered 2019-09-04: 20 mg via INTRAMUSCULAR
  Filled 2019-09-04: qty 1

## 2019-09-04 NOTE — Discharge Instructions (Signed)
Octreotide injection solution What is this medicine? OCTREOTIDE (ok TREE oh tide) is used to reduce blood levels of growth hormone in patients with a condition called acromegaly. This medicine also reduces flushing and watery diarrhea caused by certain types of cancer. This medicine may be used for other purposes; ask your health care provider or pharmacist if you have questions. COMMON BRAND NAME(S): Bynfezia, Sandostatin What should I tell my health care provider before I take this medicine? They need to know if you have any of these conditions:  diabetes  gallbladder disease  kidney disease  liver disease  thyroid disease  an unusual or allergic reaction to octreotide, other medicines, foods, dyes, or preservatives  pregnant or trying to get pregnant  breast-feeding How should I use this medicine? This medicine is for injection under the skin or into a vein (only in emergency situations). It is usually given by a health care professional in a hospital or clinic setting. If you get this medicine at home, you will be taught how to prepare and give this medicine. Allow the injection solution to come to room temperature before use. Do not warm it artificially. Use exactly as directed. Take your medicine at regular intervals. Do not take your medicine more often than directed. It is important that you put your used needles and syringes in a special sharps container. Do not put them in a trash can. If you do not have a sharps container, call your pharmacist or healthcare provider to get one. Talk to your pediatrician regarding the use of this medicine in children. Special care may be needed. Overdosage: If you think you have taken too much of this medicine contact a poison control center or emergency room at once. NOTE: This medicine is only for you. Do not share this medicine with others. What if I miss a dose? If you miss a dose, take it as soon as you can. If it is almost time for your  next dose, take only that dose. Do not take double or extra doses. What may interact with this medicine?  bromocriptine  certain medicines for blood pressure, heart disease, irregular heartbeat  cyclosporine  diuretics  medicines for diabetes, including insulin  quinidine This list may not describe all possible interactions. Give your health care provider a list of all the medicines, herbs, non-prescription drugs, or dietary supplements you use. Also tell them if you smoke, drink alcohol, or use illegal drugs. Some items may interact with your medicine. What should I watch for while using this medicine? Visit your doctor or health care professional for regular checks on your progress. To help reduce irritation at the injection site, use a different site for each injection and make sure the solution is at room temperature before use. This medicine may cause decreases in blood sugar. Signs of low blood sugar include chills, cool, pale skin or cold sweats, drowsiness, extreme hunger, fast heartbeat, headache, nausea, nervousness or anxiety, shakiness, trembling, unsteadiness, tiredness, or weakness. Contact your doctor or health care professional right away if you experience any of these symptoms. This medicine may increase blood sugar. Ask your healthcare provider if changes in diet or medicines are needed if you have diabetes. This medicine may cause a decrease in vitamin B12. You should make sure that you get enough vitamin B12 while you are taking this medicine. Discuss the foods you eat and the vitamins you take with your health care professional. What side effects may I notice from receiving this medicine? Side   effects that you should report to your doctor or health care professional as soon as possible:  allergic reactions like skin rash, itching or hives, swelling of the face, lips, or tongue  fast, slow, or irregular heartbeat  right upper belly pain  severe stomach pain  signs  and symptoms of high blood sugar such as being more thirsty or hungry or having to urinate more than normal. You may also feel very tired or have blurry vision.  signs and symptoms of low blood sugar such as feeling anxious; confusion; dizziness; increased hunger; unusually weak or tired; increased sweating; shakiness; cold, clammy skin; irritable; headache; blurred vision; fast heartbeat; loss of consciousness  unusually weak or tired Side effects that usually do not require medical attention (report to your doctor or health care professional if they continue or are bothersome):  diarrhea  dizziness  gas  headache  nausea, vomiting  pain, redness, or irritation at site where injected  upset stomach This list may not describe all possible side effects. Call your doctor for medical advice about side effects. You may report side effects to FDA at 1-800-FDA-1088. Where should I keep my medicine? Keep out of the reach of children. Store in a refrigerator between 2 and 8 degrees C (36 and 46 degrees F). Protect from light. Allow to come to room temperature naturally. Do not use artificial heat. If protected from light, the injection may be stored at room temperature between 20 and 30 degrees C (70 and 86 degrees F) for 14 days. After the initial use, throw away any unused portion of a multiple dose vial after 14 days. Throw away unused portions of the ampules after use. NOTE: This sheet is a summary. It may not cover all possible information. If you have questions about this medicine, talk to your doctor, pharmacist, or health care provider.  2020 Elsevier/Gold Standard (2018-08-03 13:33:09)  

## 2019-09-04 NOTE — Progress Notes (Signed)
PATIENT CARE CENTER NOTE  Diagnosis:Acute blood loss anemia (D62)   Provider:Beavers, Joelene Millin, MD   Procedure:IM Sandostatin (Octreotide)   Note:Patient received IM Sandostatin injection in the left hip. Tolerated well. Discharge instructions given. Patient alert, oriented and ambulatory at discharge.

## 2019-09-05 DIAGNOSIS — N2581 Secondary hyperparathyroidism of renal origin: Secondary | ICD-10-CM | POA: Diagnosis not present

## 2019-09-05 DIAGNOSIS — Z992 Dependence on renal dialysis: Secondary | ICD-10-CM | POA: Diagnosis not present

## 2019-09-05 DIAGNOSIS — N186 End stage renal disease: Secondary | ICD-10-CM | POA: Diagnosis not present

## 2019-09-07 DIAGNOSIS — N2581 Secondary hyperparathyroidism of renal origin: Secondary | ICD-10-CM | POA: Diagnosis not present

## 2019-09-07 DIAGNOSIS — Z992 Dependence on renal dialysis: Secondary | ICD-10-CM | POA: Diagnosis not present

## 2019-09-07 DIAGNOSIS — N186 End stage renal disease: Secondary | ICD-10-CM | POA: Diagnosis not present

## 2019-09-10 DIAGNOSIS — N186 End stage renal disease: Secondary | ICD-10-CM | POA: Diagnosis not present

## 2019-09-10 DIAGNOSIS — Z992 Dependence on renal dialysis: Secondary | ICD-10-CM | POA: Diagnosis not present

## 2019-09-10 DIAGNOSIS — N2581 Secondary hyperparathyroidism of renal origin: Secondary | ICD-10-CM | POA: Diagnosis not present

## 2019-09-12 DIAGNOSIS — N2581 Secondary hyperparathyroidism of renal origin: Secondary | ICD-10-CM | POA: Diagnosis not present

## 2019-09-12 DIAGNOSIS — Z992 Dependence on renal dialysis: Secondary | ICD-10-CM | POA: Diagnosis not present

## 2019-09-12 DIAGNOSIS — N186 End stage renal disease: Secondary | ICD-10-CM | POA: Diagnosis not present

## 2019-09-14 DIAGNOSIS — N2581 Secondary hyperparathyroidism of renal origin: Secondary | ICD-10-CM | POA: Diagnosis not present

## 2019-09-14 DIAGNOSIS — N186 End stage renal disease: Secondary | ICD-10-CM | POA: Diagnosis not present

## 2019-09-14 DIAGNOSIS — Z992 Dependence on renal dialysis: Secondary | ICD-10-CM | POA: Diagnosis not present

## 2019-09-15 DIAGNOSIS — I5033 Acute on chronic diastolic (congestive) heart failure: Secondary | ICD-10-CM | POA: Diagnosis not present

## 2019-09-15 DIAGNOSIS — C3492 Malignant neoplasm of unspecified part of left bronchus or lung: Secondary | ICD-10-CM | POA: Diagnosis not present

## 2019-09-15 DIAGNOSIS — R0600 Dyspnea, unspecified: Secondary | ICD-10-CM | POA: Diagnosis not present

## 2019-09-17 DIAGNOSIS — N186 End stage renal disease: Secondary | ICD-10-CM | POA: Diagnosis not present

## 2019-09-17 DIAGNOSIS — N2581 Secondary hyperparathyroidism of renal origin: Secondary | ICD-10-CM | POA: Diagnosis not present

## 2019-09-17 DIAGNOSIS — Z992 Dependence on renal dialysis: Secondary | ICD-10-CM | POA: Diagnosis not present

## 2019-09-18 DIAGNOSIS — Z992 Dependence on renal dialysis: Secondary | ICD-10-CM | POA: Diagnosis not present

## 2019-09-18 DIAGNOSIS — N032 Chronic nephritic syndrome with diffuse membranous glomerulonephritis: Secondary | ICD-10-CM | POA: Diagnosis not present

## 2019-09-18 DIAGNOSIS — N186 End stage renal disease: Secondary | ICD-10-CM | POA: Diagnosis not present

## 2019-09-19 DIAGNOSIS — N186 End stage renal disease: Secondary | ICD-10-CM | POA: Diagnosis not present

## 2019-09-19 DIAGNOSIS — Z992 Dependence on renal dialysis: Secondary | ICD-10-CM | POA: Diagnosis not present

## 2019-09-19 DIAGNOSIS — N2581 Secondary hyperparathyroidism of renal origin: Secondary | ICD-10-CM | POA: Diagnosis not present

## 2019-09-21 DIAGNOSIS — N186 End stage renal disease: Secondary | ICD-10-CM | POA: Diagnosis not present

## 2019-09-21 DIAGNOSIS — N2581 Secondary hyperparathyroidism of renal origin: Secondary | ICD-10-CM | POA: Diagnosis not present

## 2019-09-21 DIAGNOSIS — Z992 Dependence on renal dialysis: Secondary | ICD-10-CM | POA: Diagnosis not present

## 2019-09-24 DIAGNOSIS — N2581 Secondary hyperparathyroidism of renal origin: Secondary | ICD-10-CM | POA: Diagnosis not present

## 2019-09-24 DIAGNOSIS — Z992 Dependence on renal dialysis: Secondary | ICD-10-CM | POA: Diagnosis not present

## 2019-09-24 DIAGNOSIS — N186 End stage renal disease: Secondary | ICD-10-CM | POA: Diagnosis not present

## 2019-09-26 DIAGNOSIS — N186 End stage renal disease: Secondary | ICD-10-CM | POA: Diagnosis not present

## 2019-09-26 DIAGNOSIS — N2581 Secondary hyperparathyroidism of renal origin: Secondary | ICD-10-CM | POA: Diagnosis not present

## 2019-09-26 DIAGNOSIS — Z992 Dependence on renal dialysis: Secondary | ICD-10-CM | POA: Diagnosis not present

## 2019-09-28 DIAGNOSIS — N186 End stage renal disease: Secondary | ICD-10-CM | POA: Diagnosis not present

## 2019-09-28 DIAGNOSIS — N2581 Secondary hyperparathyroidism of renal origin: Secondary | ICD-10-CM | POA: Diagnosis not present

## 2019-09-28 DIAGNOSIS — Z992 Dependence on renal dialysis: Secondary | ICD-10-CM | POA: Diagnosis not present

## 2019-10-01 DIAGNOSIS — N186 End stage renal disease: Secondary | ICD-10-CM | POA: Diagnosis not present

## 2019-10-01 DIAGNOSIS — Z992 Dependence on renal dialysis: Secondary | ICD-10-CM | POA: Diagnosis not present

## 2019-10-01 DIAGNOSIS — N2581 Secondary hyperparathyroidism of renal origin: Secondary | ICD-10-CM | POA: Diagnosis not present

## 2019-10-02 ENCOUNTER — Other Ambulatory Visit: Payer: Self-pay

## 2019-10-02 ENCOUNTER — Telehealth: Payer: Self-pay | Admitting: Gastroenterology

## 2019-10-02 ENCOUNTER — Ambulatory Visit (HOSPITAL_COMMUNITY)
Admission: RE | Admit: 2019-10-02 | Discharge: 2019-10-02 | Disposition: A | Discharge status: Home / Self Care | Payer: Medicare PPO | Source: Ambulatory Visit | Attending: Internal Medicine | Admitting: Internal Medicine

## 2019-10-02 DIAGNOSIS — D62 Acute posthemorrhagic anemia: Secondary | ICD-10-CM | POA: Insufficient documentation

## 2019-10-02 MED ORDER — OCTREOTIDE ACETATE 20 MG IM KIT
20.0000 mg | PACK | INTRAMUSCULAR | Status: DC
  Administered 2019-10-02: 20 mg via INTRAMUSCULAR
  Filled 2019-10-02: qty 1

## 2019-10-02 NOTE — Telephone Encounter (Signed)
Pt's caregiver is requesting a call back from a nurse to discuss the pt's health, did not want to disclose further info.  CB Sherman

## 2019-10-02 NOTE — Progress Notes (Signed)
PATIENT CARE CENTER NOTE  Diagnosis: Acute blood loss anemia   Provider: [name], MD   Procedure: IM Sandostatin   Note: Prior to IM injection administration, BP was 81/35; 88/38 while sitting and 106/65 while standing. Patient's MD was contacted, per provider, RN should proceed with giving the injection. Patient received IM Sandostatin injection in the left hip.Tolerated well. Discharge instructions given. Patient alert, oriented and ambulatory on discharge.

## 2019-10-02 NOTE — Telephone Encounter (Signed)
Pt at hospital for monthly sandostatin injection. BP was 81/35, took it again and got 88/38. Took BP with pt standing and reading was 106/65. RN asking if ok to give injection.   Spoke with Dr. Tarri Glenn and per Dr. Tarri Glenn it is ok to proceed with [facility]. RN aware.

## 2019-10-03 DIAGNOSIS — N2581 Secondary hyperparathyroidism of renal origin: Secondary | ICD-10-CM | POA: Diagnosis not present

## 2019-10-03 DIAGNOSIS — Z992 Dependence on renal dialysis: Secondary | ICD-10-CM | POA: Diagnosis not present

## 2019-10-03 DIAGNOSIS — N186 End stage renal disease: Secondary | ICD-10-CM | POA: Diagnosis not present

## 2019-10-05 ENCOUNTER — Encounter: Payer: Self-pay | Admitting: Family Medicine

## 2019-10-05 DIAGNOSIS — N2581 Secondary hyperparathyroidism of renal origin: Secondary | ICD-10-CM | POA: Diagnosis not present

## 2019-10-05 DIAGNOSIS — Z992 Dependence on renal dialysis: Secondary | ICD-10-CM | POA: Diagnosis not present

## 2019-10-05 DIAGNOSIS — N186 End stage renal disease: Secondary | ICD-10-CM | POA: Diagnosis not present

## 2019-10-08 DIAGNOSIS — N2581 Secondary hyperparathyroidism of renal origin: Secondary | ICD-10-CM | POA: Diagnosis not present

## 2019-10-08 DIAGNOSIS — N186 End stage renal disease: Secondary | ICD-10-CM | POA: Diagnosis not present

## 2019-10-08 DIAGNOSIS — Z992 Dependence on renal dialysis: Secondary | ICD-10-CM | POA: Diagnosis not present

## 2019-10-10 DIAGNOSIS — N186 End stage renal disease: Secondary | ICD-10-CM | POA: Diagnosis not present

## 2019-10-10 DIAGNOSIS — N2581 Secondary hyperparathyroidism of renal origin: Secondary | ICD-10-CM | POA: Diagnosis not present

## 2019-10-10 DIAGNOSIS — Z992 Dependence on renal dialysis: Secondary | ICD-10-CM | POA: Diagnosis not present

## 2019-10-12 DIAGNOSIS — Z992 Dependence on renal dialysis: Secondary | ICD-10-CM | POA: Diagnosis not present

## 2019-10-12 DIAGNOSIS — N2581 Secondary hyperparathyroidism of renal origin: Secondary | ICD-10-CM | POA: Diagnosis not present

## 2019-10-12 DIAGNOSIS — N186 End stage renal disease: Secondary | ICD-10-CM | POA: Diagnosis not present

## 2019-10-15 DIAGNOSIS — Z992 Dependence on renal dialysis: Secondary | ICD-10-CM | POA: Diagnosis not present

## 2019-10-15 DIAGNOSIS — N2581 Secondary hyperparathyroidism of renal origin: Secondary | ICD-10-CM | POA: Diagnosis not present

## 2019-10-15 DIAGNOSIS — N186 End stage renal disease: Secondary | ICD-10-CM | POA: Diagnosis not present

## 2019-10-16 DIAGNOSIS — R0600 Dyspnea, unspecified: Secondary | ICD-10-CM | POA: Diagnosis not present

## 2019-10-16 DIAGNOSIS — C3492 Malignant neoplasm of unspecified part of left bronchus or lung: Secondary | ICD-10-CM | POA: Diagnosis not present

## 2019-10-16 DIAGNOSIS — I5033 Acute on chronic diastolic (congestive) heart failure: Secondary | ICD-10-CM | POA: Diagnosis not present

## 2019-10-17 DIAGNOSIS — N2581 Secondary hyperparathyroidism of renal origin: Secondary | ICD-10-CM | POA: Diagnosis not present

## 2019-10-17 DIAGNOSIS — N186 End stage renal disease: Secondary | ICD-10-CM | POA: Diagnosis not present

## 2019-10-17 DIAGNOSIS — Z992 Dependence on renal dialysis: Secondary | ICD-10-CM | POA: Diagnosis not present

## 2019-10-18 DIAGNOSIS — N186 End stage renal disease: Secondary | ICD-10-CM | POA: Diagnosis not present

## 2019-10-18 DIAGNOSIS — N032 Chronic nephritic syndrome with diffuse membranous glomerulonephritis: Secondary | ICD-10-CM | POA: Diagnosis not present

## 2019-10-18 DIAGNOSIS — Z992 Dependence on renal dialysis: Secondary | ICD-10-CM | POA: Diagnosis not present

## 2019-10-19 DIAGNOSIS — N2581 Secondary hyperparathyroidism of renal origin: Secondary | ICD-10-CM | POA: Diagnosis not present

## 2019-10-19 DIAGNOSIS — Z992 Dependence on renal dialysis: Secondary | ICD-10-CM | POA: Diagnosis not present

## 2019-10-19 DIAGNOSIS — N186 End stage renal disease: Secondary | ICD-10-CM | POA: Diagnosis not present

## 2019-10-22 DIAGNOSIS — Z992 Dependence on renal dialysis: Secondary | ICD-10-CM | POA: Diagnosis not present

## 2019-10-22 DIAGNOSIS — N186 End stage renal disease: Secondary | ICD-10-CM | POA: Diagnosis not present

## 2019-10-22 DIAGNOSIS — N2581 Secondary hyperparathyroidism of renal origin: Secondary | ICD-10-CM | POA: Diagnosis not present

## 2019-10-24 DIAGNOSIS — N2581 Secondary hyperparathyroidism of renal origin: Secondary | ICD-10-CM | POA: Diagnosis not present

## 2019-10-24 DIAGNOSIS — Z992 Dependence on renal dialysis: Secondary | ICD-10-CM | POA: Diagnosis not present

## 2019-10-24 DIAGNOSIS — N186 End stage renal disease: Secondary | ICD-10-CM | POA: Diagnosis not present

## 2019-10-25 DIAGNOSIS — N186 End stage renal disease: Secondary | ICD-10-CM | POA: Diagnosis not present

## 2019-10-25 DIAGNOSIS — Z992 Dependence on renal dialysis: Secondary | ICD-10-CM | POA: Diagnosis not present

## 2019-10-25 DIAGNOSIS — I871 Compression of vein: Secondary | ICD-10-CM | POA: Diagnosis not present

## 2019-10-26 DIAGNOSIS — N186 End stage renal disease: Secondary | ICD-10-CM | POA: Diagnosis not present

## 2019-10-26 DIAGNOSIS — Z992 Dependence on renal dialysis: Secondary | ICD-10-CM | POA: Diagnosis not present

## 2019-10-26 DIAGNOSIS — N2581 Secondary hyperparathyroidism of renal origin: Secondary | ICD-10-CM | POA: Diagnosis not present

## 2019-10-29 ENCOUNTER — Encounter (HOSPITAL_COMMUNITY): Payer: Medicare PPO

## 2019-10-29 DIAGNOSIS — N186 End stage renal disease: Secondary | ICD-10-CM | POA: Diagnosis not present

## 2019-10-29 DIAGNOSIS — N2581 Secondary hyperparathyroidism of renal origin: Secondary | ICD-10-CM | POA: Diagnosis not present

## 2019-10-29 DIAGNOSIS — Z992 Dependence on renal dialysis: Secondary | ICD-10-CM | POA: Diagnosis not present

## 2019-10-30 ENCOUNTER — Other Ambulatory Visit: Payer: Self-pay

## 2019-10-30 ENCOUNTER — Ambulatory Visit (HOSPITAL_COMMUNITY)
Admission: RE | Admit: 2019-10-30 | Discharge: 2019-10-30 | Disposition: A | Discharge status: Home / Self Care | Payer: Medicare PPO | Source: Ambulatory Visit | Attending: Internal Medicine | Admitting: Internal Medicine

## 2019-10-30 DIAGNOSIS — D62 Acute posthemorrhagic anemia: Secondary | ICD-10-CM | POA: Insufficient documentation

## 2019-10-30 MED ORDER — OCTREOTIDE ACETATE 20 MG IM KIT
20.0000 mg | PACK | INTRAMUSCULAR | Status: DC
  Administered 2019-10-30: 20 mg via INTRAMUSCULAR
  Filled 2019-10-30: qty 1

## 2019-10-30 NOTE — Progress Notes (Signed)
PATIENT CARE CENTER NOTE  Diagnosis:Acute blood loss anemia (D62)   Provider:Beavers, Joelene Millin, MD   Procedure:IM Sandostatin (Octreotide)   Note:Patient received IM Sandostatin injection in the left hip. Tolerated well. AVS offered but patient refused. Patient alert, oriented and ambulatory at discharge.

## 2019-10-30 NOTE — Discharge Instructions (Signed)
Octreotide injection solution What is this medicine? OCTREOTIDE (ok TREE oh tide) is used to reduce blood levels of growth hormone in patients with a condition called acromegaly. This medicine also reduces flushing and watery diarrhea caused by certain types of cancer. This medicine may be used for other purposes; ask your health care provider or pharmacist if you have questions. COMMON BRAND NAME(S): Bynfezia, Sandostatin What should I tell my health care provider before I take this medicine? They need to know if you have any of these conditions:  diabetes  gallbladder disease  kidney disease  liver disease  thyroid disease  an unusual or allergic reaction to octreotide, other medicines, foods, dyes, or preservatives  pregnant or trying to get pregnant  breast-feeding How should I use this medicine? This medicine is for injection under the skin or into a vein (only in emergency situations). It is usually given by a health care professional in a hospital or clinic setting. If you get this medicine at home, you will be taught how to prepare and give this medicine. Allow the injection solution to come to room temperature before use. Do not warm it artificially. Use exactly as directed. Take your medicine at regular intervals. Do not take your medicine more often than directed. It is important that you put your used needles and syringes in a special sharps container. Do not put them in a trash can. If you do not have a sharps container, call your pharmacist or healthcare provider to get one. Talk to your pediatrician regarding the use of this medicine in children. Special care may be needed. Overdosage: If you think you have taken too much of this medicine contact a poison control center or emergency room at once. NOTE: This medicine is only for you. Do not share this medicine with others. What if I miss a dose? If you miss a dose, take it as soon as you can. If it is almost time for your  next dose, take only that dose. Do not take double or extra doses. What may interact with this medicine?  bromocriptine  certain medicines for blood pressure, heart disease, irregular heartbeat  cyclosporine  diuretics  medicines for diabetes, including insulin  quinidine This list may not describe all possible interactions. Give your health care provider a list of all the medicines, herbs, non-prescription drugs, or dietary supplements you use. Also tell them if you smoke, drink alcohol, or use illegal drugs. Some items may interact with your medicine. What should I watch for while using this medicine? Visit your doctor or health care professional for regular checks on your progress. To help reduce irritation at the injection site, use a different site for each injection and make sure the solution is at room temperature before use. This medicine may cause decreases in blood sugar. Signs of low blood sugar include chills, cool, pale skin or cold sweats, drowsiness, extreme hunger, fast heartbeat, headache, nausea, nervousness or anxiety, shakiness, trembling, unsteadiness, tiredness, or weakness. Contact your doctor or health care professional right away if you experience any of these symptoms. This medicine may increase blood sugar. Ask your healthcare provider if changes in diet or medicines are needed if you have diabetes. This medicine may cause a decrease in vitamin B12. You should make sure that you get enough vitamin B12 while you are taking this medicine. Discuss the foods you eat and the vitamins you take with your health care professional. What side effects may I notice from receiving this medicine? Side   effects that you should report to your doctor or health care professional as soon as possible:  allergic reactions like skin rash, itching or hives, swelling of the face, lips, or tongue  fast, slow, or irregular heartbeat  right upper belly pain  severe stomach pain  signs  and symptoms of high blood sugar such as being more thirsty or hungry or having to urinate more than normal. You may also feel very tired or have blurry vision.  signs and symptoms of low blood sugar such as feeling anxious; confusion; dizziness; increased hunger; unusually weak or tired; increased sweating; shakiness; cold, clammy skin; irritable; headache; blurred vision; fast heartbeat; loss of consciousness  unusually weak or tired Side effects that usually do not require medical attention (report to your doctor or health care professional if they continue or are bothersome):  diarrhea  dizziness  gas  headache  nausea, vomiting  pain, redness, or irritation at site where injected  upset stomach This list may not describe all possible side effects. Call your doctor for medical advice about side effects. You may report side effects to FDA at 1-800-FDA-1088. Where should I keep my medicine? Keep out of the reach of children. Store in a refrigerator between 2 and 8 degrees C (36 and 46 degrees F). Protect from light. Allow to come to room temperature naturally. Do not use artificial heat. If protected from light, the injection may be stored at room temperature between 20 and 30 degrees C (70 and 86 degrees F) for 14 days. After the initial use, throw away any unused portion of a multiple dose vial after 14 days. Throw away unused portions of the ampules after use. NOTE: This sheet is a summary. It may not cover all possible information. If you have questions about this medicine, talk to your doctor, pharmacist, or health care provider.  2020 Elsevier/Gold Standard (2018-08-03 13:33:09)  

## 2019-10-31 DIAGNOSIS — N186 End stage renal disease: Secondary | ICD-10-CM | POA: Diagnosis not present

## 2019-10-31 DIAGNOSIS — N2581 Secondary hyperparathyroidism of renal origin: Secondary | ICD-10-CM | POA: Diagnosis not present

## 2019-10-31 DIAGNOSIS — Z992 Dependence on renal dialysis: Secondary | ICD-10-CM | POA: Diagnosis not present

## 2019-11-02 ENCOUNTER — Other Ambulatory Visit: Payer: Self-pay | Admitting: Nephrology

## 2019-11-02 DIAGNOSIS — R319 Hematuria, unspecified: Secondary | ICD-10-CM

## 2019-11-02 DIAGNOSIS — N186 End stage renal disease: Secondary | ICD-10-CM | POA: Diagnosis not present

## 2019-11-02 DIAGNOSIS — N2581 Secondary hyperparathyroidism of renal origin: Secondary | ICD-10-CM | POA: Diagnosis not present

## 2019-11-02 DIAGNOSIS — Z992 Dependence on renal dialysis: Secondary | ICD-10-CM | POA: Diagnosis not present

## 2019-11-05 DIAGNOSIS — N2581 Secondary hyperparathyroidism of renal origin: Secondary | ICD-10-CM | POA: Diagnosis not present

## 2019-11-05 DIAGNOSIS — Z992 Dependence on renal dialysis: Secondary | ICD-10-CM | POA: Diagnosis not present

## 2019-11-05 DIAGNOSIS — N186 End stage renal disease: Secondary | ICD-10-CM | POA: Diagnosis not present

## 2019-11-07 DIAGNOSIS — N186 End stage renal disease: Secondary | ICD-10-CM | POA: Diagnosis not present

## 2019-11-07 DIAGNOSIS — Z992 Dependence on renal dialysis: Secondary | ICD-10-CM | POA: Diagnosis not present

## 2019-11-07 DIAGNOSIS — N2581 Secondary hyperparathyroidism of renal origin: Secondary | ICD-10-CM | POA: Diagnosis not present

## 2019-11-08 ENCOUNTER — Ambulatory Visit
Admission: RE | Admit: 2019-11-08 | Discharge: 2019-11-08 | Disposition: A | Discharge status: Home / Self Care | Payer: Medicare PPO | Source: Ambulatory Visit | Attending: Nephrology | Admitting: Nephrology

## 2019-11-08 DIAGNOSIS — R319 Hematuria, unspecified: Secondary | ICD-10-CM

## 2019-11-08 DIAGNOSIS — N400 Enlarged prostate without lower urinary tract symptoms: Secondary | ICD-10-CM | POA: Diagnosis not present

## 2019-11-08 DIAGNOSIS — N281 Cyst of kidney, acquired: Secondary | ICD-10-CM | POA: Diagnosis not present

## 2019-11-09 DIAGNOSIS — N2581 Secondary hyperparathyroidism of renal origin: Secondary | ICD-10-CM | POA: Diagnosis not present

## 2019-11-09 DIAGNOSIS — N186 End stage renal disease: Secondary | ICD-10-CM | POA: Diagnosis not present

## 2019-11-09 DIAGNOSIS — Z992 Dependence on renal dialysis: Secondary | ICD-10-CM | POA: Diagnosis not present

## 2019-11-12 DIAGNOSIS — N186 End stage renal disease: Secondary | ICD-10-CM | POA: Diagnosis not present

## 2019-11-12 DIAGNOSIS — Z992 Dependence on renal dialysis: Secondary | ICD-10-CM | POA: Diagnosis not present

## 2019-11-12 DIAGNOSIS — N2581 Secondary hyperparathyroidism of renal origin: Secondary | ICD-10-CM | POA: Diagnosis not present

## 2019-11-14 DIAGNOSIS — Z992 Dependence on renal dialysis: Secondary | ICD-10-CM | POA: Diagnosis not present

## 2019-11-14 DIAGNOSIS — N186 End stage renal disease: Secondary | ICD-10-CM | POA: Diagnosis not present

## 2019-11-14 DIAGNOSIS — N2581 Secondary hyperparathyroidism of renal origin: Secondary | ICD-10-CM | POA: Diagnosis not present

## 2019-11-15 ENCOUNTER — Telehealth: Payer: Self-pay | Admitting: Gastroenterology

## 2019-11-15 DIAGNOSIS — C3492 Malignant neoplasm of unspecified part of left bronchus or lung: Secondary | ICD-10-CM | POA: Diagnosis not present

## 2019-11-15 DIAGNOSIS — Z992 Dependence on renal dialysis: Secondary | ICD-10-CM | POA: Diagnosis not present

## 2019-11-15 DIAGNOSIS — N186 End stage renal disease: Secondary | ICD-10-CM | POA: Diagnosis not present

## 2019-11-15 DIAGNOSIS — I509 Heart failure, unspecified: Secondary | ICD-10-CM | POA: Diagnosis not present

## 2019-11-15 DIAGNOSIS — R0600 Dyspnea, unspecified: Secondary | ICD-10-CM | POA: Diagnosis not present

## 2019-11-15 DIAGNOSIS — Z85118 Personal history of other malignant neoplasm of bronchus and lung: Secondary | ICD-10-CM | POA: Diagnosis not present

## 2019-11-15 DIAGNOSIS — H547 Unspecified visual loss: Secondary | ICD-10-CM | POA: Diagnosis not present

## 2019-11-15 DIAGNOSIS — I5033 Acute on chronic diastolic (congestive) heart failure: Secondary | ICD-10-CM | POA: Diagnosis not present

## 2019-11-15 DIAGNOSIS — Z87891 Personal history of nicotine dependence: Secondary | ICD-10-CM | POA: Diagnosis not present

## 2019-11-15 DIAGNOSIS — I132 Hypertensive heart and chronic kidney disease with heart failure and with stage 5 chronic kidney disease, or end stage renal disease: Secondary | ICD-10-CM | POA: Diagnosis not present

## 2019-11-15 DIAGNOSIS — K219 Gastro-esophageal reflux disease without esophagitis: Secondary | ICD-10-CM | POA: Diagnosis not present

## 2019-11-15 NOTE — Telephone Encounter (Signed)
Pt is being referred by Lawson Radar PRASAD [phone] for GI Bleed, pt would like to see Dr Tarri Glenn but nothing available anytime soon.

## 2019-11-15 NOTE — Telephone Encounter (Signed)
Pt scheduled to see Tye Savoy NP 11/16/19 at 9am. Please notify pt of appt.

## 2019-11-16 ENCOUNTER — Ambulatory Visit: Payer: Medicare PPO | Admitting: Nurse Practitioner

## 2019-11-16 DIAGNOSIS — Z992 Dependence on renal dialysis: Secondary | ICD-10-CM | POA: Diagnosis not present

## 2019-11-16 DIAGNOSIS — N186 End stage renal disease: Secondary | ICD-10-CM | POA: Diagnosis not present

## 2019-11-16 DIAGNOSIS — N2581 Secondary hyperparathyroidism of renal origin: Secondary | ICD-10-CM | POA: Diagnosis not present

## 2019-11-16 NOTE — Progress Notes (Deleted)
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 COPD  Patient last seen here Feb 2021.  Interval History:     Data Reviewed:   Previous Endoscopic Evaluations / Pertinent Studies:   Past Medical History:  Diagnosis Date  . Anemia   . 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  [name] Cataract    right eye - surgery to remove  . CHF (congestive heart failure) ([place])   . Elevated PSA 11/29/2016   Patient is followed by [facility].  Most recent PSA near 11.  He has had atypia on biopsy.  November 2008  . Essential hypertension, benign 06/08/2012  . Full dentures   . GERD (gastroesophageal reflux disease)   . GI bleed   . Glaucoma   . Heart murmur    since childhood, never has caused any problems  . History of rheumatic fever 11/04/2015  . Hyperlipidemia 04/25/2013  . Hypertension   . Malignant neoplasm of left lung ([place]) 02/08/2017  . RBBB 07/12/2017  . Sickle cell trait (HCC)    no problems per patient    Current Medications, Allergies, Past Surgical History, Family History and Social History were reviewed in [employer] record.   Current Outpatient Medications  Medication Sig Dispense Refill  . amLODipine (NORVASC) 5 MG tablet Take 1 tablet (5 mg total) by mouth daily. (Patient taking  differently: Take 5 mg by mouth at bedtime. ) 30 tablet 0  . Brinzolamide-Brimonidine (SIMBRINZA) 1-0.2 % SUSP Place 2-3 drops into both eyes 3 (three) times daily as needed (eye pressure).     . carvedilol (COREG) 3.125 MG tablet Take 1 tablet (3.125 mg total) by mouth 2 (two) times daily with a meal. 60 tablet 0  . ferric citrate (AURYXIA) 1 GM 210 MG(Fe) tablet Take 210 mg by mouth 5 (five) times daily. Take 1 tablet (210 mg) by mouth with meals and snacks    . hydrALAZINE (APRESOLINE) 25 MG tablet Take 1 tablet (25 mg total) by mouth every 8 (eight) hours. 90 tablet 0  . Lifitegrast (XIIDRA) 5 % SOLN Place 1 drop into both eyes 2 (two) times daily as needed (dry eyes).    . multivitamin (RENA-VIT) TABS tablet Take 1 tablet by mouth daily.    . Netarsudil-Latanoprost (ROCKLATAN) 0.02-0.005 % SOLN Place 1 drop into both eyes at bedtime.     . Nutritional Supplements (FEEDING SUPPLEMENT, NEPRO CARB STEADY,) LIQD Take 237 mLs by mouth 3 (three) times daily.     . Octreotide Acetate (SANDOSTATIN IJ) Inject 1 Dose as directed every 30 (thirty) days.     [name] oxyCODONE-acetaminophen (PERCOCET/ROXICET) 5-325 MG tablet Take 1 tablet by mouth every 6 (six) hours as needed. 8 tablet 0  . pantoprazole (PROTONIX) 40 MG tablet TAKE 1 TABLET BY MOUTH TWICE A DAY 180 tablet 1  .  sevelamer carbonate (RENVELA) 800 MG tablet Take 800 mg by mouth 5 (five) times daily. Take 1 tablet (800 mg) by mouth with meals and snacks     No current facility-administered medications for this visit.    Review of Systems: No chest pain. No shortness of breath. No urinary complaints.   PHYSICAL EXAM :    Wt Readings from Last 3 Encounters:  03/01/19 163 lb (73.9 kg)  02/27/19 161 lb (73 kg)  01/25/19 180 lb (81.6 kg)    There were no vitals taken for this visit. Constitutional:  Pleasant ***male in no acute distress. Psychiatric: Normal mood and affect. Behavior is normal. EENT: Pupils normal.  Conjunctivae are normal.  No scleral icterus. Neck supple.  Cardiovascular: Normal rate, regular rhythm. No edema Pulmonary/chest: Effort normal and breath sounds normal. No wheezing, rales or rhonchi. Abdominal: Soft, nondistended, nontender. Bowel sounds active throughout. There are no masses palpable. No hepatomegaly. Neurological: Alert and oriented to person place and time. Skin: Skin is warm and dry. No rashes noted.  Tye Savoy, NP  11/16/2019, 8:28 AM  Cc:  Referring Provider [name], MD

## 2019-11-17 IMAGING — XA IR US GUIDE VASC ACCESS RIGHT
1 series · 1 of 1 positions shown · non-contrast
Comparison: none

INDICATION: 77-year-old male with acute renal failure in need of hemodialysis.
We had an initial plan for placement of a tunneled hemodialysis
catheter, however he has leukocytosis today as well as new onset
atrial fibrillation with uncontrolled rapid ventricular response.
Therefore, we will place a non tunneled hemodialysis catheter to
allow him to get hemodialysis quickly without the additional stress
of moderate sedation. We can then convert the non tunneled catheter
to a tunneled device once his other issues have been addressed.

[Series 300: dsa body · 1 of 1 slices shown]
[im 1/1]
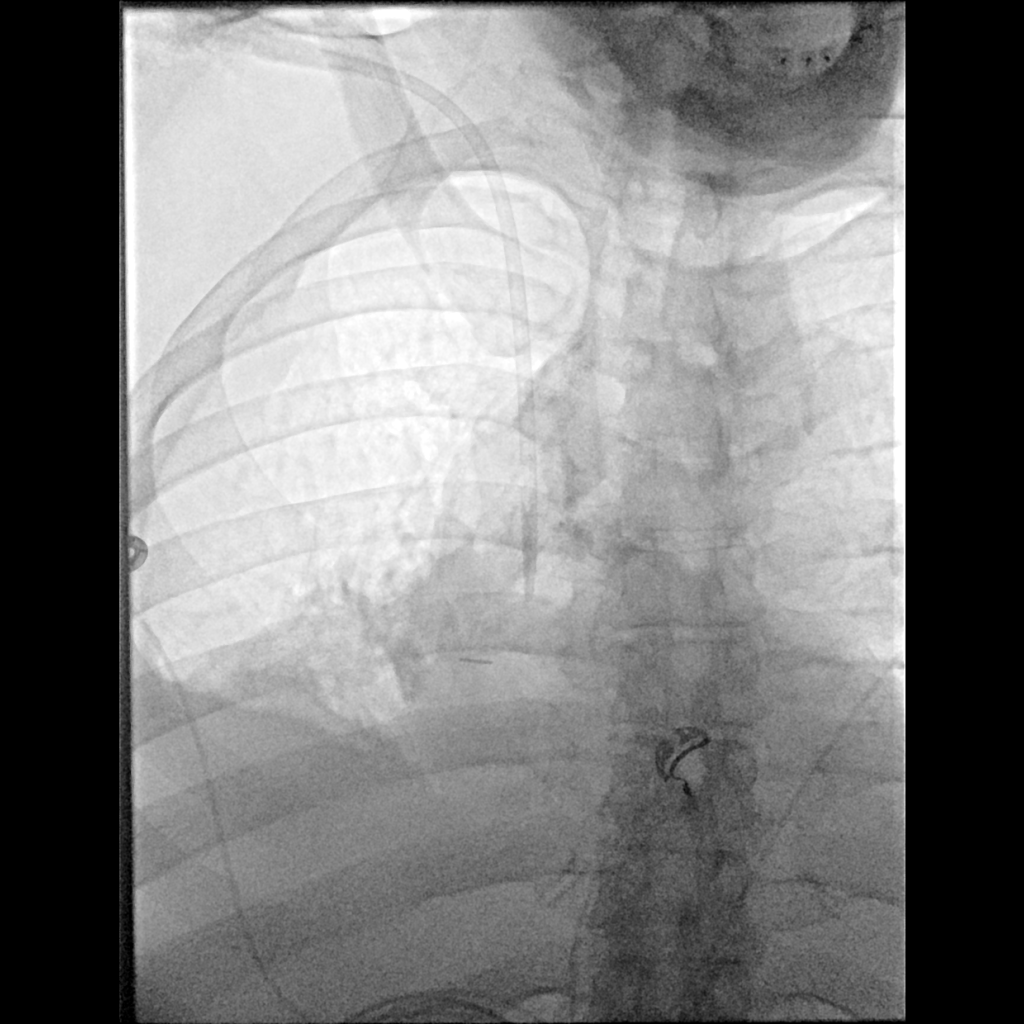

[1 of 1 positions shown; findings below may reference images not displayed]

EXAM:
IR RIGHT FLOURO GUIDE CV LINE; IR ULTRASOUND GUIDANCE VASC ACCESS
RIGHT

MEDICATIONS:
None

ANESTHESIA/SEDATION:
None

FLUOROSCOPY TIME:  Fluoroscopy Time: 0 minutes 12 seconds (1 mGy).

COMPLICATIONS:
None immediate.

PROCEDURE:
Informed written consent was obtained from the patient after a
thorough discussion of the procedural risks, benefits and
alternatives. All questions were addressed. Maximal Sterile Barrier
Technique was utilized including caps, mask, sterile gowns, sterile
gloves, sterile drape, hand hygiene and skin antiseptic. A timeout
was performed prior to the initiation of the procedure.

The right internal jugular vein was interrogated with ultrasound and
found to be widely patent. An image was obtained and stored for the
medical record. Local anesthesia was attained by infiltration with
1% lidocaine. A small dermatotomy was made. Under real-time
sonographic guidance, the vessel was punctured with an 18 gauge
needle. A 0.035 wire was advanced into the inferior vena cava. The
needle was removed. The skin tract was dilated and a 16 cm
triple-lumen hemodialysis catheter was advanced over the wire and
positioned with the catheter tip at the superior cavoatrial
junction.

The catheter was aspirated and then flushed with heparinized saline.
The catheter was then secured to the skin with 0 Prolene suture and
a sterile bandage. The patient tolerated the procedure well.
IMPRESSION: Successful placement of a non tunneled hemodialysis catheter via the
right internal jugular vein. The tip of the catheter is at the
superior cavoatrial junction and the catheter is ready for immediate
use.

## 2019-11-18 DIAGNOSIS — Z992 Dependence on renal dialysis: Secondary | ICD-10-CM | POA: Diagnosis not present

## 2019-11-18 DIAGNOSIS — N186 End stage renal disease: Secondary | ICD-10-CM | POA: Diagnosis not present

## 2019-11-18 DIAGNOSIS — N032 Chronic nephritic syndrome with diffuse membranous glomerulonephritis: Secondary | ICD-10-CM | POA: Diagnosis not present

## 2019-11-19 DIAGNOSIS — N2581 Secondary hyperparathyroidism of renal origin: Secondary | ICD-10-CM | POA: Diagnosis not present

## 2019-11-19 DIAGNOSIS — Z992 Dependence on renal dialysis: Secondary | ICD-10-CM | POA: Diagnosis not present

## 2019-11-19 DIAGNOSIS — N186 End stage renal disease: Secondary | ICD-10-CM | POA: Diagnosis not present

## 2019-11-21 DIAGNOSIS — N2581 Secondary hyperparathyroidism of renal origin: Secondary | ICD-10-CM | POA: Diagnosis not present

## 2019-11-21 DIAGNOSIS — Z992 Dependence on renal dialysis: Secondary | ICD-10-CM | POA: Diagnosis not present

## 2019-11-21 DIAGNOSIS — N186 End stage renal disease: Secondary | ICD-10-CM | POA: Diagnosis not present

## 2019-11-22 ENCOUNTER — Ambulatory Visit: Payer: Medicare PPO | Admitting: Gastroenterology

## 2019-11-22 ENCOUNTER — Encounter: Payer: Self-pay | Admitting: Gastroenterology

## 2019-11-22 VITALS — BP 120/70 | HR 80 | Ht 70.0 in | Wt 152.0 lb

## 2019-11-22 DIAGNOSIS — D500 Iron deficiency anemia secondary to blood loss (chronic): Secondary | ICD-10-CM

## 2019-11-22 DIAGNOSIS — K5520 Angiodysplasia of colon without hemorrhage: Secondary | ICD-10-CM | POA: Diagnosis not present

## 2019-11-22 NOTE — Progress Notes (Signed)
Reviewed and agree with management plans. ? ?Kaylei Frink L. Kayon Dozier, MD, MPH  ?

## 2019-11-22 NOTE — Patient Instructions (Addendum)
If you are age 81 or older, your body mass index should be between 23-30. Your Body mass index is 21.81 kg/m. If this is out of the aforementioned range listed, please consider follow up with your Primary Care Provider.  If you are age 81 or younger, your body mass index should be between 19-25. Your Body mass index is 21.81 kg/m. If this is out of the aformentioned range listed, please consider follow up with your Primary Care Provider.   We will reach out to Dr. Carolin Sicks for your recent lab work results.  Thank you for entrusting me with your care and choosing [facility].  [name], PA

## 2019-11-22 NOTE — Progress Notes (Addendum)
11/22/2019 Andrew Guerra [id number] Mar 28, 1940   HISTORY OF PRESENT ILLNESS:  This is an 79 y.o. 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 Had 7 duodenal and proximal jejunal AVMs treated with APC on June 22, 2018.Dropped his hemoglobin again in October sowas sent to the hospital for transfusion andunderwent small bowel enteroscopy with Dr. Ardis Hughs 11/12/18 that showed ooxing AVM distal to the duodenal bulb and medium sized hiatal hernia with no Cameron's erosions. Capsule endoscopy 11/12/18 showed 3 small non-bleeding AVMs in the small bowel.  He had recurrent anemia and melena and was started on Sandostatin.  He returns here today for follow-up.  He is accompanied by his wife.  He continues on dialysis Monday/Wednesday/Friday.  He is on long-acting octreotide monthly.  They check his hemoglobin weekly at dialysis.  He said that his hemoglobin last week was at 9 g and they like to keep him around 10 g.  He receives RBC stimulating agents also.  I do not have any records of labs in close to a year.  He believes that they check his iron levels regularly as well.  He says that he has not seen any evidence of blood in his stools.  He saw a couple of drops of blood in the toilet bowl on a couple of occasions when he had sensation to urinate.  He says that he feels great.  He says that he thinks that this visit was precautionary since he had not been seen since 02/2019.   Past Medical History:  Diagnosis Date  . Anemia   . 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  [name] Cataract    right eye - surgery to remove  . CHF (congestive heart failure) ([place])   . Elevated PSA 11/29/2016   Patient is followed by [facility].  Most recent PSA near 11.  He has had  atypia on biopsy.  November 2008  . Essential hypertension, benign 06/08/2012  . Full dentures   . GERD (gastroesophageal reflux disease)   . GI bleed   . Glaucoma   . Heart murmur    since childhood, never has caused any problems  . History of rheumatic fever 11/04/2015  . Hyperlipidemia 04/25/2013  . Hypertension   . Malignant neoplasm of left lung ([place]) 02/08/2017  . RBBB 07/12/2017  . Sickle cell trait ([place])    no problems per patient   Past Surgical History:  Procedure Laterality Date  . AV FISTULA PLACEMENT Right 01/25/2019   Procedure: ARTERIOVENOUS (AV) FISTULA CREATION RIGHT ARM;  Surgeon: Rosetta Posner, MD;  Location: [place];  Service: Vascular;  Laterality: Right;  . BIOPSY  11/11/2017   Procedure: BIOPSY;  Surgeon: Lavena Bullion, DO;  Location: [place] ENDOSCOPY;  Service: Gastroenterology;;  . BIOPSY  01/10/2018   Procedure: BIOPSY;  Surgeon: [name], MD;  Location: [place];  Service: Gastroenterology;;  . BIOPSY  06/22/2018   Procedure: BIOPSY;  Surgeon: Yetta Flock, MD;  Location: [facility] ENDOSCOPY;  Service: Gastroenterology;;  . CATARACT EXTRACTION Right   . cataract eye surgery Right   . COLONOSCOPY  11/2009   hx polyps/[place]  . COLONOSCOPY N/A 06/22/2018   Procedure: COLONOSCOPY;  Surgeon: Yetta Flock, MD;  Location: [place] ENDOSCOPY;  Service: Gastroenterology;  Laterality: N/A;  . ENTEROSCOPY N/A 06/22/2018   Procedure: PUSH ENTEROSCOPY;  Surgeon: Yetta Flock, MD;  Location: [facility] ENDOSCOPY;  Service: Gastroenterology;  Laterality: N/A;  . ENTEROSCOPY N/A 11/12/2018   Procedure: ENTEROSCOPY;  Surgeon: Milus Banister, MD;  Location: [facility] ENDOSCOPY;  Service: Endoscopy;  Laterality: N/A;  . ESOPHAGOGASTRODUODENOSCOPY N/A 11/11/2017   Procedure: ESOPHAGOGASTRODUODENOSCOPY (EGD);  Surgeon: Lavena Bullion, DO;  Location: [facility] ENDOSCOPY;  Service: Gastroenterology;  Laterality: N/A;  . ESOPHAGOGASTRODUODENOSCOPY (EGD) WITH PROPOFOL N/A 01/10/2018    Procedure: ESOPHAGOGASTRODUODENOSCOPY (EGD) WITH PROPOFOL;  Surgeon: [name], MD;  Location: [place];  Service: Gastroenterology;  Laterality: N/A;  . GIVENS CAPSULE STUDY N/A 11/12/2018   Procedure: GIVENS CAPSULE STUDY;  Surgeon: Milus Banister, MD;  Location: [facility] ENDOSCOPY;  Service: Endoscopy;  Laterality: N/A;  . HEMOSTASIS CLIP PLACEMENT  11/12/2018   Procedure: HEMOSTASIS CLIP PLACEMENT;  Surgeon: Milus Banister, MD;  Location: [place];  Service: Endoscopy;;  . HOT HEMOSTASIS N/A 06/22/2018   Procedure: HOT HEMOSTASIS (ARGON PLASMA COAGULATION/BICAP);  Surgeon: Yetta Flock, MD;  Location: [facility] ENDOSCOPY;  Service: Gastroenterology;  Laterality: N/A;  . HOT HEMOSTASIS N/A 11/12/2018   Procedure: HOT HEMOSTASIS (ARGON PLASMA COAGULATION/BICAP);  Surgeon: Milus Banister, MD;  Location: [facility] ENDOSCOPY;  Service: Endoscopy;  Laterality: N/A;  . IR FLUORO GUIDE CV LINE LEFT  03/15/2019  . IR FLUORO GUIDE CV LINE RIGHT  01/16/2018  . IR FLUORO GUIDE CV LINE RIGHT  01/31/2018  . IR FLUORO GUIDE CV LINE RIGHT  10/06/2018  . IR FLUORO GUIDE CV LINE RIGHT  10/11/2018  . IR REMOVAL TUN CV CATH W/O FL  05/10/2019  . IR US GUIDE VASC ACCESS RIGHT  01/16/2018  . IR US GUIDE VASC ACCESS RIGHT  01/31/2018  . IR US GUIDE VASC ACCESS RIGHT  10/06/2018  . POLYPECTOMY  06/22/2018   Procedure: POLYPECTOMY;  Surgeon: Yetta Flock, MD;  Location: [place];  Service: Gastroenterology;;  . PROSTATE BIOPSY    . right eye surgery     to lower eye pressure  . RIGHT HEART CATH N/A 01/19/2018   Procedure: RIGHT HEART CATH;  Surgeon: Nelva Bush, MD;  Location: [place] CV LAB;  Service: Cardiovascular;  Laterality: N/A;  . TONSILLECTOMY    . VIDEO ASSISTED THORACOSCOPY (VATS)/ LOBECTOMY Right 12/26/2015  . wisdom tteeth ext      reports that he quit smoking about 8 years ago. His smoking use included cigarettes. He smoked 1.00 pack per day. He has never used smokeless  tobacco. He reports previous alcohol use of about 1.0 standard drink of alcohol per week. He reports that he does not use drugs. family history includes Cerebral aneurysm in his mother; Diabetes in his mother and sister; Lung cancer in his father. Allergies  Allergen Reactions  . Ambien [Zolpidem Tartrate] Other (See Comments)    Has hallucinations and Homicidal Ideations per Wife   . Lipitor [Atorvastatin] Other (See Comments)    Achy and tired      Outpatient Encounter Medications as of 11/22/2019  Medication Sig  . amLODipine (NORVASC) 5 MG tablet Take 1 tablet (5 mg total) by mouth daily. (Patient taking differently: Take 5 mg by mouth at bedtime. )  . Brinzolamide-Brimonidine (SIMBRINZA) 1-0.2 % SUSP Place 2-3 drops into both eyes 3 (three) times daily as needed (eye pressure).   . carvedilol (COREG) 3.125 MG tablet Take 1 tablet (3.125 mg total) by mouth 2 (two) times daily  with a meal.  . ferric citrate (AURYXIA) 1 GM 210 MG(Fe) tablet Take 210 mg by mouth 5 (five) times daily. Take 1 tablet (210 mg) by mouth with meals and snacks  . hydrALAZINE (APRESOLINE) 25 MG tablet Take 1 tablet (25 mg total) by mouth every 8 (eight) hours.  [name] Lifitegrast (XIIDRA) 5 % SOLN Place 1 drop into both eyes 2 (two) times daily as needed (dry eyes).  . multivitamin (RENA-VIT) TABS tablet Take 1 tablet by mouth daily.  . Netarsudil-Latanoprost (ROCKLATAN) 0.02-0.005 % SOLN Place 1 drop into both eyes at bedtime.   . Nutritional Supplements (FEEDING SUPPLEMENT, NEPRO CARB STEADY,) LIQD Take 237 mLs by mouth 3 (three) times daily.   . Octreotide Acetate (SANDOSTATIN IJ) Inject 1 Dose as directed every 30 (thirty) days.   [name] oxyCODONE-acetaminophen (PERCOCET/ROXICET) 5-325 MG tablet Take 1 tablet by mouth every 6 (six) hours as needed.  . pantoprazole (PROTONIX) 40 MG tablet TAKE 1 TABLET BY MOUTH TWICE A DAY  . sevelamer carbonate (RENVELA) 800 MG tablet Take 800 mg by mouth 5 (five) times daily. Take 1  tablet (800 mg) by mouth with meals and snacks   No facility-administered encounter medications on file as of 11/22/2019.     REVIEW OF SYSTEMS  : All other systems reviewed and negative except where noted in the History of Present Illness.   PHYSICAL EXAM: BP 120/70   Pulse 80   Ht 5\' 10"  (1.778 m)   Wt 152 lb (68.9 kg)   SpO2 97%   BMI 21.81 kg/m  General: Thin AA male in no acute distress Head: Normocephalic and atraumatic Eyes:  Sclerae anicteric, conjunctiva pink. Ears: Normal auditory acuity Lungs: Clear throughout to auscultation; no W/R/R. Heart: Regular rate and rhythm; no M/R/G. Abdomen: Soft, non-distended.  BS present.  Non-tender. Musculoskeletal: Symmetrical with no gross deformities  Skin: No lesions on visible extremities Extremities: No edema  Neurological: Alert oriented x 4, grossly non-focal Psychological:  Alert and cooperative. 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 - 7 duodenal and proximal jejunal AVMs treated with APC 06/22/18    - ooxing AVM distal to the duodenal bulb treated with APC 11/12/18    - capsule endoscopy 11/12/18 showed 3 small non-bleeding AVMs in the small bowel    - started Sandostatin LAR for recurrent blood loss anemia 11/2018 Iron deficiency anemia History ofPUD: Gastric and duodenal ulcers - initially diagnosed as source of bleeding 10/2017  - H pylori + 10/2017 - treated with bismuth, metronidazole, doxycycline, PPI - subsequent gastric biopsies 01/10/18 negative for H pylori - no PUD seen on EGD 06/22/18 Intestinal metaplasia - intestinal metaplasia on biopsies 10/10 and after H pylori irradication - patient not at high risk for gastric cancer, no family history of stomach cancer - no surveillance indicated based on AGA guidelines LA class A reflux esophagitis on EGD 01/10/18 Duodenal nodule seen on EGD 10/2017  ([place]) - Biopsy 10/2017 showed benign mucosa - No duodenal or pancreatic abnormalities identified on MRI/MRCP planned - EUS with Dr. 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 Hypoalbuminemia  *  Clinically improved on Sandostatin.  No overt evidence of GI bleeding currently.  Will continue. *We do not have any access to labs in approximately a year.  They monitor his hemoglobin weekly at dialysis and he believes that they check his iron levels as well.  We will obtain labs that have been performed over the past 2 months.  If iron studies not checked then they will need to be performed and if low then consider IV iron. *He receives RBC stimulating agent at dialysis as well.  **Addendum: Received labs.  Hemoglobin on 11/3 is 9.8 g, 10/27 is 9.1 g, 10/20 is 10.5 g, 10/1308.8 g, 10/6 9.2 g, 929 8.7 g.  Iron studies on October 20 showed a high ferritin of 1705, iron level slightly low at 40, low TIBC at 160.   CC:  [name], MD

## 2019-11-23 DIAGNOSIS — N2581 Secondary hyperparathyroidism of renal origin: Secondary | ICD-10-CM | POA: Diagnosis not present

## 2019-11-23 DIAGNOSIS — N186 End stage renal disease: Secondary | ICD-10-CM | POA: Diagnosis not present

## 2019-11-23 DIAGNOSIS — Z992 Dependence on renal dialysis: Secondary | ICD-10-CM | POA: Diagnosis not present

## 2019-11-26 DIAGNOSIS — Z992 Dependence on renal dialysis: Secondary | ICD-10-CM | POA: Diagnosis not present

## 2019-11-26 DIAGNOSIS — N186 End stage renal disease: Secondary | ICD-10-CM | POA: Diagnosis not present

## 2019-11-26 DIAGNOSIS — N2581 Secondary hyperparathyroidism of renal origin: Secondary | ICD-10-CM | POA: Diagnosis not present

## 2019-11-28 DIAGNOSIS — N2581 Secondary hyperparathyroidism of renal origin: Secondary | ICD-10-CM | POA: Diagnosis not present

## 2019-11-28 DIAGNOSIS — Z992 Dependence on renal dialysis: Secondary | ICD-10-CM | POA: Diagnosis not present

## 2019-11-28 DIAGNOSIS — N186 End stage renal disease: Secondary | ICD-10-CM | POA: Diagnosis not present

## 2019-11-30 ENCOUNTER — Telehealth: Payer: Self-pay

## 2019-11-30 DIAGNOSIS — N186 End stage renal disease: Secondary | ICD-10-CM | POA: Diagnosis not present

## 2019-11-30 DIAGNOSIS — N2581 Secondary hyperparathyroidism of renal origin: Secondary | ICD-10-CM | POA: Diagnosis not present

## 2019-11-30 DIAGNOSIS — Z992 Dependence on renal dialysis: Secondary | ICD-10-CM | POA: Diagnosis not present

## 2019-11-30 NOTE — Telephone Encounter (Signed)
-----   Message from Loralie Champagne, PA-C sent at 11/30/2019  3:54 PM EST ----- Please let him know that I received his labs from the dialysis center. His hemoglobin appears very stable. Continue to monitor for now as they have been.

## 2019-11-30 NOTE — Telephone Encounter (Signed)
The pt has been advised and all questions answered

## 2019-12-03 ENCOUNTER — Telehealth: Payer: Self-pay | Admitting: Gastroenterology

## 2019-12-03 ENCOUNTER — Other Ambulatory Visit: Payer: Self-pay

## 2019-12-03 DIAGNOSIS — Z992 Dependence on renal dialysis: Secondary | ICD-10-CM | POA: Diagnosis not present

## 2019-12-03 DIAGNOSIS — D500 Iron deficiency anemia secondary to blood loss (chronic): Secondary | ICD-10-CM

## 2019-12-03 DIAGNOSIS — N2581 Secondary hyperparathyroidism of renal origin: Secondary | ICD-10-CM | POA: Diagnosis not present

## 2019-12-03 DIAGNOSIS — N186 End stage renal disease: Secondary | ICD-10-CM | POA: Diagnosis not present

## 2019-12-03 NOTE — Telephone Encounter (Signed)
Patient has an appointment for tomorrow 12/04/2019 for IV Med SCAC at [facility] and they are requesting an updated order

## 2019-12-03 NOTE — Telephone Encounter (Signed)
Orders were added for sandostatin for monthly injections per Dr Tarri Glenn

## 2019-12-04 ENCOUNTER — Other Ambulatory Visit: Payer: Self-pay

## 2019-12-04 ENCOUNTER — Ambulatory Visit (HOSPITAL_COMMUNITY)
Admission: RE | Admit: 2019-12-04 | Discharge: 2019-12-04 | Disposition: A | Discharge status: Home / Self Care | Payer: Medicare PPO | Source: Ambulatory Visit | Attending: Internal Medicine | Admitting: Internal Medicine

## 2019-12-04 DIAGNOSIS — D500 Iron deficiency anemia secondary to blood loss (chronic): Secondary | ICD-10-CM | POA: Diagnosis not present

## 2019-12-04 MED ORDER — OCTREOTIDE ACETATE 20 MG IM KIT
20.0000 mg | PACK | INTRAMUSCULAR | Status: DC
  Administered 2019-12-04: 20 mg via INTRAMUSCULAR
  Filled 2019-12-04: qty 1

## 2019-12-04 NOTE — Discharge Instructions (Signed)
Octreotide injection solution What is this medicine? OCTREOTIDE (ok TREE oh tide) is used to reduce blood levels of growth hormone in patients with a condition called acromegaly. This medicine also reduces flushing and watery diarrhea caused by certain types of cancer. This medicine may be used for other purposes; ask your health care provider or pharmacist if you have questions. COMMON BRAND NAME(S): Bynfezia, Sandostatin What should I tell my health care provider before I take this medicine? They need to know if you have any of these conditions:  diabetes  gallbladder disease  kidney disease  liver disease  thyroid disease  an unusual or allergic reaction to octreotide, other medicines, foods, dyes, or preservatives  pregnant or trying to get pregnant  breast-feeding How should I use this medicine? This medicine is for injection under the skin or into a vein (only in emergency situations). It is usually given by a health care professional in a hospital or clinic setting. If you get this medicine at home, you will be taught how to prepare and give this medicine. Allow the injection solution to come to room temperature before use. Do not warm it artificially. Use exactly as directed. Take your medicine at regular intervals. Do not take your medicine more often than directed. It is important that you put your used needles and syringes in a special sharps container. Do not put them in a trash can. If you do not have a sharps container, call your pharmacist or healthcare provider to get one. Talk to your pediatrician regarding the use of this medicine in children. Special care may be needed. Overdosage: If you think you have taken too much of this medicine contact a poison control center or emergency room at once. NOTE: This medicine is only for you. Do not share this medicine with others. What if I miss a dose? If you miss a dose, take it as soon as you can. If it is almost time for your  next dose, take only that dose. Do not take double or extra doses. What may interact with this medicine?  bromocriptine  certain medicines for blood pressure, heart disease, irregular heartbeat  cyclosporine  diuretics  medicines for diabetes, including insulin  quinidine This list may not describe all possible interactions. Give your health care provider a list of all the medicines, herbs, non-prescription drugs, or dietary supplements you use. Also tell them if you smoke, drink alcohol, or use illegal drugs. Some items may interact with your medicine. What should I watch for while using this medicine? Visit your doctor or health care professional for regular checks on your progress. To help reduce irritation at the injection site, use a different site for each injection and make sure the solution is at room temperature before use. This medicine may cause decreases in blood sugar. Signs of low blood sugar include chills, cool, pale skin or cold sweats, drowsiness, extreme hunger, fast heartbeat, headache, nausea, nervousness or anxiety, shakiness, trembling, unsteadiness, tiredness, or weakness. Contact your doctor or health care professional right away if you experience any of these symptoms. This medicine may increase blood sugar. Ask your healthcare provider if changes in diet or medicines are needed if you have diabetes. This medicine may cause a decrease in vitamin B12. You should make sure that you get enough vitamin B12 while you are taking this medicine. Discuss the foods you eat and the vitamins you take with your health care professional. What side effects may I notice from receiving this medicine? Side   effects that you should report to your doctor or health care professional as soon as possible:  allergic reactions like skin rash, itching or hives, swelling of the face, lips, or tongue  fast, slow, or irregular heartbeat  right upper belly pain  severe stomach pain  signs  and symptoms of high blood sugar such as being more thirsty or hungry or having to urinate more than normal. You may also feel very tired or have blurry vision.  signs and symptoms of low blood sugar such as feeling anxious; confusion; dizziness; increased hunger; unusually weak or tired; increased sweating; shakiness; cold, clammy skin; irritable; headache; blurred vision; fast heartbeat; loss of consciousness  unusually weak or tired Side effects that usually do not require medical attention (report to your doctor or health care professional if they continue or are bothersome):  diarrhea  dizziness  gas  headache  nausea, vomiting  pain, redness, or irritation at site where injected  upset stomach This list may not describe all possible side effects. Call your doctor for medical advice about side effects. You may report side effects to FDA at 1-800-FDA-1088. Where should I keep my medicine? Keep out of the reach of children. Store in a refrigerator between 2 and 8 degrees C (36 and 46 degrees F). Protect from light. Allow to come to room temperature naturally. Do not use artificial heat. If protected from light, the injection may be stored at room temperature between 20 and 30 degrees C (70 and 86 degrees F) for 14 days. After the initial use, throw away any unused portion of a multiple dose vial after 14 days. Throw away unused portions of the ampules after use. NOTE: This sheet is a summary. It may not cover all possible information. If you have questions about this medicine, talk to your doctor, pharmacist, or health care provider.  2020 Elsevier/Gold Standard (2018-08-03 13:33:09)  

## 2019-12-04 NOTE — Progress Notes (Signed)
PATIENT CARE CENTER NOTE  Diagnosis:Acute blood loss anemia (D62)   Provider:Beavers, Joelene Millin, MD   Procedure:IM Sandostatin (Octreotide)   Note:Patient received IM Sandostatin injection in the left hip. Tolerated well. Vital signs stable. AVS offered but patient refused. Patient alert, oriented and ambulatory at discharge.

## 2019-12-05 DIAGNOSIS — N2581 Secondary hyperparathyroidism of renal origin: Secondary | ICD-10-CM | POA: Diagnosis not present

## 2019-12-05 DIAGNOSIS — N186 End stage renal disease: Secondary | ICD-10-CM | POA: Diagnosis not present

## 2019-12-05 DIAGNOSIS — Z992 Dependence on renal dialysis: Secondary | ICD-10-CM | POA: Diagnosis not present

## 2019-12-07 DIAGNOSIS — N186 End stage renal disease: Secondary | ICD-10-CM | POA: Diagnosis not present

## 2019-12-07 DIAGNOSIS — N2581 Secondary hyperparathyroidism of renal origin: Secondary | ICD-10-CM | POA: Diagnosis not present

## 2019-12-07 DIAGNOSIS — Z992 Dependence on renal dialysis: Secondary | ICD-10-CM | POA: Diagnosis not present

## 2019-12-09 DIAGNOSIS — N2581 Secondary hyperparathyroidism of renal origin: Secondary | ICD-10-CM | POA: Diagnosis not present

## 2019-12-09 DIAGNOSIS — Z992 Dependence on renal dialysis: Secondary | ICD-10-CM | POA: Diagnosis not present

## 2019-12-09 DIAGNOSIS — N186 End stage renal disease: Secondary | ICD-10-CM | POA: Diagnosis not present

## 2019-12-11 DIAGNOSIS — Z992 Dependence on renal dialysis: Secondary | ICD-10-CM | POA: Diagnosis not present

## 2019-12-11 DIAGNOSIS — N2581 Secondary hyperparathyroidism of renal origin: Secondary | ICD-10-CM | POA: Diagnosis not present

## 2019-12-11 DIAGNOSIS — N186 End stage renal disease: Secondary | ICD-10-CM | POA: Diagnosis not present

## 2019-12-14 DIAGNOSIS — N186 End stage renal disease: Secondary | ICD-10-CM | POA: Diagnosis not present

## 2019-12-14 DIAGNOSIS — N2581 Secondary hyperparathyroidism of renal origin: Secondary | ICD-10-CM | POA: Diagnosis not present

## 2019-12-14 DIAGNOSIS — Z992 Dependence on renal dialysis: Secondary | ICD-10-CM | POA: Diagnosis not present

## 2019-12-16 DIAGNOSIS — R0600 Dyspnea, unspecified: Secondary | ICD-10-CM | POA: Diagnosis not present

## 2019-12-16 DIAGNOSIS — I5033 Acute on chronic diastolic (congestive) heart failure: Secondary | ICD-10-CM | POA: Diagnosis not present

## 2019-12-16 DIAGNOSIS — C3492 Malignant neoplasm of unspecified part of left bronchus or lung: Secondary | ICD-10-CM | POA: Diagnosis not present

## 2019-12-17 DIAGNOSIS — N2581 Secondary hyperparathyroidism of renal origin: Secondary | ICD-10-CM | POA: Diagnosis not present

## 2019-12-17 DIAGNOSIS — Z992 Dependence on renal dialysis: Secondary | ICD-10-CM | POA: Diagnosis not present

## 2019-12-17 DIAGNOSIS — N186 End stage renal disease: Secondary | ICD-10-CM | POA: Diagnosis not present

## 2019-12-18 DIAGNOSIS — Z992 Dependence on renal dialysis: Secondary | ICD-10-CM | POA: Diagnosis not present

## 2019-12-18 DIAGNOSIS — N186 End stage renal disease: Secondary | ICD-10-CM | POA: Diagnosis not present

## 2019-12-18 DIAGNOSIS — N032 Chronic nephritic syndrome with diffuse membranous glomerulonephritis: Secondary | ICD-10-CM | POA: Diagnosis not present

## 2019-12-19 DIAGNOSIS — N186 End stage renal disease: Secondary | ICD-10-CM | POA: Diagnosis not present

## 2019-12-19 DIAGNOSIS — N2581 Secondary hyperparathyroidism of renal origin: Secondary | ICD-10-CM | POA: Diagnosis not present

## 2019-12-19 DIAGNOSIS — Z992 Dependence on renal dialysis: Secondary | ICD-10-CM | POA: Diagnosis not present

## 2019-12-21 DIAGNOSIS — Z992 Dependence on renal dialysis: Secondary | ICD-10-CM | POA: Diagnosis not present

## 2019-12-21 DIAGNOSIS — N2581 Secondary hyperparathyroidism of renal origin: Secondary | ICD-10-CM | POA: Diagnosis not present

## 2019-12-21 DIAGNOSIS — N186 End stage renal disease: Secondary | ICD-10-CM | POA: Diagnosis not present

## 2019-12-24 DIAGNOSIS — N186 End stage renal disease: Secondary | ICD-10-CM | POA: Diagnosis not present

## 2019-12-24 DIAGNOSIS — N2581 Secondary hyperparathyroidism of renal origin: Secondary | ICD-10-CM | POA: Diagnosis not present

## 2019-12-24 DIAGNOSIS — Z992 Dependence on renal dialysis: Secondary | ICD-10-CM | POA: Diagnosis not present

## 2019-12-26 DIAGNOSIS — Z992 Dependence on renal dialysis: Secondary | ICD-10-CM | POA: Diagnosis not present

## 2019-12-26 DIAGNOSIS — N186 End stage renal disease: Secondary | ICD-10-CM | POA: Diagnosis not present

## 2019-12-26 DIAGNOSIS — N2581 Secondary hyperparathyroidism of renal origin: Secondary | ICD-10-CM | POA: Diagnosis not present

## 2019-12-28 DIAGNOSIS — Z992 Dependence on renal dialysis: Secondary | ICD-10-CM | POA: Diagnosis not present

## 2019-12-28 DIAGNOSIS — N2581 Secondary hyperparathyroidism of renal origin: Secondary | ICD-10-CM | POA: Diagnosis not present

## 2019-12-28 DIAGNOSIS — N186 End stage renal disease: Secondary | ICD-10-CM | POA: Diagnosis not present

## 2019-12-31 DIAGNOSIS — N186 End stage renal disease: Secondary | ICD-10-CM | POA: Diagnosis not present

## 2019-12-31 DIAGNOSIS — Z992 Dependence on renal dialysis: Secondary | ICD-10-CM | POA: Diagnosis not present

## 2019-12-31 DIAGNOSIS — N2581 Secondary hyperparathyroidism of renal origin: Secondary | ICD-10-CM | POA: Diagnosis not present

## 2020-01-01 ENCOUNTER — Ambulatory Visit (HOSPITAL_COMMUNITY)
Admission: RE | Admit: 2020-01-01 | Discharge: 2020-01-01 | Disposition: A | Discharge status: Home / Self Care | Payer: Medicare PPO | Source: Ambulatory Visit | Attending: Internal Medicine | Admitting: Internal Medicine

## 2020-01-01 ENCOUNTER — Other Ambulatory Visit: Payer: Self-pay

## 2020-01-01 DIAGNOSIS — D62 Acute posthemorrhagic anemia: Secondary | ICD-10-CM | POA: Insufficient documentation

## 2020-01-01 DIAGNOSIS — D500 Iron deficiency anemia secondary to blood loss (chronic): Secondary | ICD-10-CM | POA: Diagnosis present

## 2020-01-01 MED ORDER — OCTREOTIDE ACETATE 20 MG IM KIT
20.0000 mg | PACK | Freq: Once | INTRAMUSCULAR | Status: AC
  Administered 2020-01-01: 20 mg via INTRAMUSCULAR
  Filled 2020-01-01 (×2): qty 1

## 2020-01-01 NOTE — Discharge Instructions (Signed)
Octreotide injection solution What is this medicine? OCTREOTIDE (ok TREE oh tide) is used to reduce blood levels of growth hormone in patients with a condition called acromegaly. This medicine also reduces flushing and watery diarrhea caused by certain types of cancer. This medicine may be used for other purposes; ask your health care provider or pharmacist if you have questions. COMMON BRAND NAME(S): Bynfezia, Sandostatin What should I tell my health care provider before I take this medicine? They need to know if you have any of these conditions:  diabetes  gallbladder disease  kidney disease  liver disease  thyroid disease  an unusual or allergic reaction to octreotide, other medicines, foods, dyes, or preservatives  pregnant or trying to get pregnant  breast-feeding How should I use this medicine? This medicine is for injection under the skin or into a vein (only in emergency situations). It is usually given by a health care professional in a hospital or clinic setting. If you get this medicine at home, you will be taught how to prepare and give this medicine. Allow the injection solution to come to room temperature before use. Do not warm it artificially. Use exactly as directed. Take your medicine at regular intervals. Do not take your medicine more often than directed. It is important that you put your used needles and syringes in a special sharps container. Do not put them in a trash can. If you do not have a sharps container, call your pharmacist or healthcare provider to get one. Talk to your pediatrician regarding the use of this medicine in children. Special care may be needed. Overdosage: If you think you have taken too much of this medicine contact a poison control center or emergency room at once. NOTE: This medicine is only for you. Do not share this medicine with others. What if I miss a dose? If you miss a dose, take it as soon as you can. If it is almost time for your  next dose, take only that dose. Do not take double or extra doses. What may interact with this medicine?  bromocriptine  certain medicines for blood pressure, heart disease, irregular heartbeat  cyclosporine  diuretics  medicines for diabetes, including insulin  quinidine This list may not describe all possible interactions. Give your health care provider a list of all the medicines, herbs, non-prescription drugs, or dietary supplements you use. Also tell them if you smoke, drink alcohol, or use illegal drugs. Some items may interact with your medicine. What should I watch for while using this medicine? Visit your doctor or health care professional for regular checks on your progress. To help reduce irritation at the injection site, use a different site for each injection and make sure the solution is at room temperature before use. This medicine may cause decreases in blood sugar. Signs of low blood sugar include chills, cool, pale skin or cold sweats, drowsiness, extreme hunger, fast heartbeat, headache, nausea, nervousness or anxiety, shakiness, trembling, unsteadiness, tiredness, or weakness. Contact your doctor or health care professional right away if you experience any of these symptoms. This medicine may increase blood sugar. Ask your healthcare provider if changes in diet or medicines are needed if you have diabetes. This medicine may cause a decrease in vitamin B12. You should make sure that you get enough vitamin B12 while you are taking this medicine. Discuss the foods you eat and the vitamins you take with your health care professional. What side effects may I notice from receiving this medicine? Side   effects that you should report to your doctor or health care professional as soon as possible:  allergic reactions like skin rash, itching or hives, swelling of the face, lips, or tongue  fast, slow, or irregular heartbeat  right upper belly pain  severe stomach pain  signs  and symptoms of high blood sugar such as being more thirsty or hungry or having to urinate more than normal. You may also feel very tired or have blurry vision.  signs and symptoms of low blood sugar such as feeling anxious; confusion; dizziness; increased hunger; unusually weak or tired; increased sweating; shakiness; cold, clammy skin; irritable; headache; blurred vision; fast heartbeat; loss of consciousness  unusually weak or tired Side effects that usually do not require medical attention (report to your doctor or health care professional if they continue or are bothersome):  diarrhea  dizziness  gas  headache  nausea, vomiting  pain, redness, or irritation at site where injected  upset stomach This list may not describe all possible side effects. Call your doctor for medical advice about side effects. You may report side effects to FDA at 1-800-FDA-1088. Where should I keep my medicine? Keep out of the reach of children. Store in a refrigerator between 2 and 8 degrees C (36 and 46 degrees F). Protect from light. Allow to come to room temperature naturally. Do not use artificial heat. If protected from light, the injection may be stored at room temperature between 20 and 30 degrees C (70 and 86 degrees F) for 14 days. After the initial use, throw away any unused portion of a multiple dose vial after 14 days. Throw away unused portions of the ampules after use. NOTE: This sheet is a summary. It may not cover all possible information. If you have questions about this medicine, talk to your doctor, pharmacist, or health care provider.  2020 Elsevier/Gold Standard (2018-08-03 13:33:09)  

## 2020-01-01 NOTE — Progress Notes (Signed)
PATIENT CARE CENTER NOTE  Diagnosis:Acute blood loss anemia (D62)   Provider:Beavers, Joelene Millin, MD   Procedure:IM Sandostatin (Octreotide)   Note:Patient received IM Sandostatin injection in the left hip. Tolerated well.Vital signs stable. AVS offered but patient refused. Patient alert, oriented and ambulatory at discharge.

## 2020-01-02 DIAGNOSIS — Z992 Dependence on renal dialysis: Secondary | ICD-10-CM | POA: Diagnosis not present

## 2020-01-02 DIAGNOSIS — N186 End stage renal disease: Secondary | ICD-10-CM | POA: Diagnosis not present

## 2020-01-02 DIAGNOSIS — N2581 Secondary hyperparathyroidism of renal origin: Secondary | ICD-10-CM | POA: Diagnosis not present

## 2020-01-04 DIAGNOSIS — N2581 Secondary hyperparathyroidism of renal origin: Secondary | ICD-10-CM | POA: Diagnosis not present

## 2020-01-04 DIAGNOSIS — Z992 Dependence on renal dialysis: Secondary | ICD-10-CM | POA: Diagnosis not present

## 2020-01-04 DIAGNOSIS — N186 End stage renal disease: Secondary | ICD-10-CM | POA: Diagnosis not present

## 2020-01-07 DIAGNOSIS — Z992 Dependence on renal dialysis: Secondary | ICD-10-CM | POA: Diagnosis not present

## 2020-01-07 DIAGNOSIS — N186 End stage renal disease: Secondary | ICD-10-CM | POA: Diagnosis not present

## 2020-01-07 DIAGNOSIS — N2581 Secondary hyperparathyroidism of renal origin: Secondary | ICD-10-CM | POA: Diagnosis not present

## 2020-01-09 DIAGNOSIS — Z992 Dependence on renal dialysis: Secondary | ICD-10-CM | POA: Diagnosis not present

## 2020-01-09 DIAGNOSIS — N186 End stage renal disease: Secondary | ICD-10-CM | POA: Diagnosis not present

## 2020-01-09 DIAGNOSIS — N2581 Secondary hyperparathyroidism of renal origin: Secondary | ICD-10-CM | POA: Diagnosis not present

## 2020-01-11 DIAGNOSIS — N186 End stage renal disease: Secondary | ICD-10-CM | POA: Diagnosis not present

## 2020-01-11 DIAGNOSIS — N2581 Secondary hyperparathyroidism of renal origin: Secondary | ICD-10-CM | POA: Diagnosis not present

## 2020-01-11 DIAGNOSIS — Z992 Dependence on renal dialysis: Secondary | ICD-10-CM | POA: Diagnosis not present

## 2020-01-14 DIAGNOSIS — N186 End stage renal disease: Secondary | ICD-10-CM | POA: Diagnosis not present

## 2020-01-14 DIAGNOSIS — Z992 Dependence on renal dialysis: Secondary | ICD-10-CM | POA: Diagnosis not present

## 2020-01-14 DIAGNOSIS — N2581 Secondary hyperparathyroidism of renal origin: Secondary | ICD-10-CM | POA: Diagnosis not present

## 2020-01-15 DIAGNOSIS — C3492 Malignant neoplasm of unspecified part of left bronchus or lung: Secondary | ICD-10-CM | POA: Diagnosis not present

## 2020-01-15 DIAGNOSIS — R0600 Dyspnea, unspecified: Secondary | ICD-10-CM | POA: Diagnosis not present

## 2020-01-15 DIAGNOSIS — I5033 Acute on chronic diastolic (congestive) heart failure: Secondary | ICD-10-CM | POA: Diagnosis not present

## 2020-01-16 DIAGNOSIS — N186 End stage renal disease: Secondary | ICD-10-CM | POA: Diagnosis not present

## 2020-01-16 DIAGNOSIS — Z992 Dependence on renal dialysis: Secondary | ICD-10-CM | POA: Diagnosis not present

## 2020-01-16 DIAGNOSIS — N2581 Secondary hyperparathyroidism of renal origin: Secondary | ICD-10-CM | POA: Diagnosis not present

## 2020-01-18 DIAGNOSIS — N186 End stage renal disease: Secondary | ICD-10-CM | POA: Diagnosis not present

## 2020-01-18 DIAGNOSIS — N2581 Secondary hyperparathyroidism of renal origin: Secondary | ICD-10-CM | POA: Diagnosis not present

## 2020-01-18 DIAGNOSIS — Z992 Dependence on renal dialysis: Secondary | ICD-10-CM | POA: Diagnosis not present

## 2020-01-18 DIAGNOSIS — N032 Chronic nephritic syndrome with diffuse membranous glomerulonephritis: Secondary | ICD-10-CM | POA: Diagnosis not present

## 2020-01-21 ENCOUNTER — Other Ambulatory Visit: Payer: Self-pay | Admitting: Gastroenterology

## 2020-01-23 DIAGNOSIS — Z992 Dependence on renal dialysis: Secondary | ICD-10-CM | POA: Diagnosis not present

## 2020-01-23 DIAGNOSIS — N186 End stage renal disease: Secondary | ICD-10-CM | POA: Diagnosis not present

## 2020-01-23 DIAGNOSIS — N2581 Secondary hyperparathyroidism of renal origin: Secondary | ICD-10-CM | POA: Diagnosis not present

## 2020-01-25 DIAGNOSIS — N186 End stage renal disease: Secondary | ICD-10-CM | POA: Diagnosis not present

## 2020-01-25 DIAGNOSIS — N2581 Secondary hyperparathyroidism of renal origin: Secondary | ICD-10-CM | POA: Diagnosis not present

## 2020-01-25 DIAGNOSIS — Z992 Dependence on renal dialysis: Secondary | ICD-10-CM | POA: Diagnosis not present

## 2020-01-28 DIAGNOSIS — N2581 Secondary hyperparathyroidism of renal origin: Secondary | ICD-10-CM | POA: Diagnosis not present

## 2020-01-28 DIAGNOSIS — N186 End stage renal disease: Secondary | ICD-10-CM | POA: Diagnosis not present

## 2020-01-28 DIAGNOSIS — Z992 Dependence on renal dialysis: Secondary | ICD-10-CM | POA: Diagnosis not present

## 2020-01-30 DIAGNOSIS — Z992 Dependence on renal dialysis: Secondary | ICD-10-CM | POA: Diagnosis not present

## 2020-01-30 DIAGNOSIS — N2581 Secondary hyperparathyroidism of renal origin: Secondary | ICD-10-CM | POA: Diagnosis not present

## 2020-01-30 DIAGNOSIS — N186 End stage renal disease: Secondary | ICD-10-CM | POA: Diagnosis not present

## 2020-02-01 DIAGNOSIS — N186 End stage renal disease: Secondary | ICD-10-CM | POA: Diagnosis not present

## 2020-02-01 DIAGNOSIS — N2581 Secondary hyperparathyroidism of renal origin: Secondary | ICD-10-CM | POA: Diagnosis not present

## 2020-02-01 DIAGNOSIS — Z992 Dependence on renal dialysis: Secondary | ICD-10-CM | POA: Diagnosis not present

## 2020-02-05 ENCOUNTER — Encounter (HOSPITAL_COMMUNITY): Payer: Medicare PPO

## 2020-02-07 ENCOUNTER — Ambulatory Visit (HOSPITAL_COMMUNITY)
Admission: RE | Admit: 2020-02-07 | Discharge: 2020-02-07 | Disposition: A | Discharge status: Home / Self Care | Payer: Medicare PPO | Source: Ambulatory Visit | Attending: Internal Medicine | Admitting: Internal Medicine

## 2020-02-07 ENCOUNTER — Other Ambulatory Visit: Payer: Self-pay

## 2020-02-07 DIAGNOSIS — D500 Iron deficiency anemia secondary to blood loss (chronic): Secondary | ICD-10-CM | POA: Diagnosis not present

## 2020-02-07 MED ORDER — OCTREOTIDE ACETATE 20 MG IM KIT
20.0000 mg | PACK | Freq: Once | INTRAMUSCULAR | Status: AC
  Administered 2020-02-07: 20 mg via INTRAMUSCULAR
  Filled 2020-02-07: qty 1

## 2020-02-07 NOTE — Progress Notes (Signed)
PATIENT CARE CENTER NOTE  Diagnosis:Acute blood loss anemia (D62)   Provider:Beavers, Joelene Millin, MD   Procedure:IM Sandostatin (Octreotide)   Note:Patient received IM Sandostatin injection in the left hip. Tolerated well. AVS offered but patient refused. Patient alert, oriented and ambulatory at discharge.

## 2020-02-08 DIAGNOSIS — N2581 Secondary hyperparathyroidism of renal origin: Secondary | ICD-10-CM | POA: Diagnosis not present

## 2020-02-08 DIAGNOSIS — Z992 Dependence on renal dialysis: Secondary | ICD-10-CM | POA: Diagnosis not present

## 2020-02-08 DIAGNOSIS — N186 End stage renal disease: Secondary | ICD-10-CM | POA: Diagnosis not present

## 2020-02-11 DIAGNOSIS — Z992 Dependence on renal dialysis: Secondary | ICD-10-CM | POA: Diagnosis not present

## 2020-02-11 DIAGNOSIS — N2581 Secondary hyperparathyroidism of renal origin: Secondary | ICD-10-CM | POA: Diagnosis not present

## 2020-02-11 DIAGNOSIS — N186 End stage renal disease: Secondary | ICD-10-CM | POA: Diagnosis not present

## 2020-02-13 DIAGNOSIS — Z992 Dependence on renal dialysis: Secondary | ICD-10-CM | POA: Diagnosis not present

## 2020-02-13 DIAGNOSIS — N2581 Secondary hyperparathyroidism of renal origin: Secondary | ICD-10-CM | POA: Diagnosis not present

## 2020-02-13 DIAGNOSIS — N186 End stage renal disease: Secondary | ICD-10-CM | POA: Diagnosis not present

## 2020-02-15 DIAGNOSIS — R0600 Dyspnea, unspecified: Secondary | ICD-10-CM | POA: Diagnosis not present

## 2020-02-15 DIAGNOSIS — I5033 Acute on chronic diastolic (congestive) heart failure: Secondary | ICD-10-CM | POA: Diagnosis not present

## 2020-02-15 DIAGNOSIS — C3492 Malignant neoplasm of unspecified part of left bronchus or lung: Secondary | ICD-10-CM | POA: Diagnosis not present

## 2020-02-15 DIAGNOSIS — N2581 Secondary hyperparathyroidism of renal origin: Secondary | ICD-10-CM | POA: Diagnosis not present

## 2020-02-15 DIAGNOSIS — N186 End stage renal disease: Secondary | ICD-10-CM | POA: Diagnosis not present

## 2020-02-15 DIAGNOSIS — Z992 Dependence on renal dialysis: Secondary | ICD-10-CM | POA: Diagnosis not present

## 2020-02-18 DIAGNOSIS — N2581 Secondary hyperparathyroidism of renal origin: Secondary | ICD-10-CM | POA: Diagnosis not present

## 2020-02-18 DIAGNOSIS — Z992 Dependence on renal dialysis: Secondary | ICD-10-CM | POA: Diagnosis not present

## 2020-02-18 DIAGNOSIS — N186 End stage renal disease: Secondary | ICD-10-CM | POA: Diagnosis not present

## 2020-02-18 DIAGNOSIS — N032 Chronic nephritic syndrome with diffuse membranous glomerulonephritis: Secondary | ICD-10-CM | POA: Diagnosis not present

## 2020-02-20 DIAGNOSIS — Z992 Dependence on renal dialysis: Secondary | ICD-10-CM | POA: Diagnosis not present

## 2020-02-20 DIAGNOSIS — N186 End stage renal disease: Secondary | ICD-10-CM | POA: Diagnosis not present

## 2020-02-20 DIAGNOSIS — N2581 Secondary hyperparathyroidism of renal origin: Secondary | ICD-10-CM | POA: Diagnosis not present

## 2020-02-22 DIAGNOSIS — N2581 Secondary hyperparathyroidism of renal origin: Secondary | ICD-10-CM | POA: Diagnosis not present

## 2020-02-22 DIAGNOSIS — Z992 Dependence on renal dialysis: Secondary | ICD-10-CM | POA: Diagnosis not present

## 2020-02-22 DIAGNOSIS — N186 End stage renal disease: Secondary | ICD-10-CM | POA: Diagnosis not present

## 2020-02-24 DIAGNOSIS — M1990 Unspecified osteoarthritis, unspecified site: Secondary | ICD-10-CM | POA: Diagnosis not present

## 2020-02-24 DIAGNOSIS — I132 Hypertensive heart and chronic kidney disease with heart failure and with stage 5 chronic kidney disease, or end stage renal disease: Secondary | ICD-10-CM | POA: Diagnosis not present

## 2020-02-24 DIAGNOSIS — E261 Secondary hyperaldosteronism: Secondary | ICD-10-CM | POA: Diagnosis not present

## 2020-02-24 DIAGNOSIS — N186 End stage renal disease: Secondary | ICD-10-CM | POA: Diagnosis not present

## 2020-02-24 DIAGNOSIS — Z833 Family history of diabetes mellitus: Secondary | ICD-10-CM | POA: Diagnosis not present

## 2020-02-24 DIAGNOSIS — Z809 Family history of malignant neoplasm, unspecified: Secondary | ICD-10-CM | POA: Diagnosis not present

## 2020-02-24 DIAGNOSIS — K219 Gastro-esophageal reflux disease without esophagitis: Secondary | ICD-10-CM | POA: Diagnosis not present

## 2020-02-24 DIAGNOSIS — Z992 Dependence on renal dialysis: Secondary | ICD-10-CM | POA: Diagnosis not present

## 2020-02-24 DIAGNOSIS — I509 Heart failure, unspecified: Secondary | ICD-10-CM | POA: Diagnosis not present

## 2020-02-25 DIAGNOSIS — N2581 Secondary hyperparathyroidism of renal origin: Secondary | ICD-10-CM | POA: Diagnosis not present

## 2020-02-25 DIAGNOSIS — Z992 Dependence on renal dialysis: Secondary | ICD-10-CM | POA: Diagnosis not present

## 2020-02-25 DIAGNOSIS — N186 End stage renal disease: Secondary | ICD-10-CM | POA: Diagnosis not present

## 2020-02-27 DIAGNOSIS — N186 End stage renal disease: Secondary | ICD-10-CM | POA: Diagnosis not present

## 2020-02-27 DIAGNOSIS — Z992 Dependence on renal dialysis: Secondary | ICD-10-CM | POA: Diagnosis not present

## 2020-02-27 DIAGNOSIS — N2581 Secondary hyperparathyroidism of renal origin: Secondary | ICD-10-CM | POA: Diagnosis not present

## 2020-02-29 DIAGNOSIS — Z992 Dependence on renal dialysis: Secondary | ICD-10-CM | POA: Diagnosis not present

## 2020-02-29 DIAGNOSIS — N2581 Secondary hyperparathyroidism of renal origin: Secondary | ICD-10-CM | POA: Diagnosis not present

## 2020-02-29 DIAGNOSIS — N186 End stage renal disease: Secondary | ICD-10-CM | POA: Diagnosis not present

## 2020-03-03 DIAGNOSIS — Z992 Dependence on renal dialysis: Secondary | ICD-10-CM | POA: Diagnosis not present

## 2020-03-03 DIAGNOSIS — N2581 Secondary hyperparathyroidism of renal origin: Secondary | ICD-10-CM | POA: Diagnosis not present

## 2020-03-03 DIAGNOSIS — N186 End stage renal disease: Secondary | ICD-10-CM | POA: Diagnosis not present

## 2020-03-05 DIAGNOSIS — N2581 Secondary hyperparathyroidism of renal origin: Secondary | ICD-10-CM | POA: Diagnosis not present

## 2020-03-05 DIAGNOSIS — Z992 Dependence on renal dialysis: Secondary | ICD-10-CM | POA: Diagnosis not present

## 2020-03-05 DIAGNOSIS — N186 End stage renal disease: Secondary | ICD-10-CM | POA: Diagnosis not present

## 2020-03-07 DIAGNOSIS — N186 End stage renal disease: Secondary | ICD-10-CM | POA: Diagnosis not present

## 2020-03-07 DIAGNOSIS — N2581 Secondary hyperparathyroidism of renal origin: Secondary | ICD-10-CM | POA: Diagnosis not present

## 2020-03-07 DIAGNOSIS — Z992 Dependence on renal dialysis: Secondary | ICD-10-CM | POA: Diagnosis not present

## 2020-03-10 DIAGNOSIS — N2581 Secondary hyperparathyroidism of renal origin: Secondary | ICD-10-CM | POA: Diagnosis not present

## 2020-03-10 DIAGNOSIS — Z992 Dependence on renal dialysis: Secondary | ICD-10-CM | POA: Diagnosis not present

## 2020-03-10 DIAGNOSIS — N186 End stage renal disease: Secondary | ICD-10-CM | POA: Diagnosis not present

## 2020-03-11 ENCOUNTER — Other Ambulatory Visit: Payer: Self-pay

## 2020-03-11 ENCOUNTER — Non-Acute Institutional Stay (HOSPITAL_COMMUNITY)
Admission: RE | Admit: 2020-03-11 | Discharge: 2020-03-11 | Disposition: A | Discharge status: Home / Self Care | Payer: Medicare PPO | Source: Ambulatory Visit | Attending: Internal Medicine | Admitting: Internal Medicine

## 2020-03-11 DIAGNOSIS — K5521 Angiodysplasia of colon with hemorrhage: Secondary | ICD-10-CM | POA: Diagnosis not present

## 2020-03-11 DIAGNOSIS — D500 Iron deficiency anemia secondary to blood loss (chronic): Secondary | ICD-10-CM | POA: Insufficient documentation

## 2020-03-11 MED ORDER — OCTREOTIDE ACETATE 20 MG IM KIT
20.0000 mg | PACK | Freq: Once | INTRAMUSCULAR | Status: AC
  Administered 2020-03-11: 20 mg via INTRAMUSCULAR
  Filled 2020-03-11: qty 1

## 2020-03-11 NOTE — Discharge Instructions (Signed)
Octreotide injection solution What is this medicine? OCTREOTIDE (ok TREE oh tide) is used to reduce blood levels of growth hormone in patients with a condition called acromegaly. This medicine also reduces flushing and watery diarrhea caused by certain types of cancer. This medicine may be used for other purposes; ask your health care provider or pharmacist if you have questions. COMMON BRAND NAME(S): Bynfezia, Sandostatin What should I tell my health care provider before I take this medicine? They need to know if you have any of these conditions:  diabetes  gallbladder disease  kidney disease  liver disease  thyroid disease  an unusual or allergic reaction to octreotide, other medicines, foods, dyes, or preservatives  pregnant or trying to get pregnant  breast-feeding How should I use this medicine? This medicine is for injection under the skin or into a vein (only in emergency situations). It is usually given by a health care professional in a hospital or clinic setting. If you get this medicine at home, you will be taught how to prepare and give this medicine. Allow the injection solution to come to room temperature before use. Do not warm it artificially. Use exactly as directed. Take your medicine at regular intervals. Do not take your medicine more often than directed. It is important that you put your used needles and syringes in a special sharps container. Do not put them in a trash can. If you do not have a sharps container, call your pharmacist or healthcare provider to get one. Talk to your pediatrician regarding the use of this medicine in children. Special care may be needed. Overdosage: If you think you have taken too much of this medicine contact a poison control center or emergency room at once. NOTE: This medicine is only for you. Do not share this medicine with others. What if I miss a dose? If you miss a dose, take it as soon as you can. If it is almost time for your  next dose, take only that dose. Do not take double or extra doses. What may interact with this medicine?  bromocriptine  certain medicines for blood pressure, heart disease, irregular heartbeat  cyclosporine  diuretics  medicines for diabetes, including insulin  quinidine This list may not describe all possible interactions. Give your health care provider a list of all the medicines, herbs, non-prescription drugs, or dietary supplements you use. Also tell them if you smoke, drink alcohol, or use illegal drugs. Some items may interact with your medicine. What should I watch for while using this medicine? Visit your doctor or health care professional for regular checks on your progress. To help reduce irritation at the injection site, use a different site for each injection and make sure the solution is at room temperature before use. This medicine may cause decreases in blood sugar. Signs of low blood sugar include chills, cool, pale skin or cold sweats, drowsiness, extreme hunger, fast heartbeat, headache, nausea, nervousness or anxiety, shakiness, trembling, unsteadiness, tiredness, or weakness. Contact your doctor or health care professional right away if you experience any of these symptoms. This medicine may increase blood sugar. Ask your healthcare provider if changes in diet or medicines are needed if you have diabetes. This medicine may cause a decrease in vitamin B12. You should make sure that you get enough vitamin B12 while you are taking this medicine. Discuss the foods you eat and the vitamins you take with your health care professional. What side effects may I notice from receiving this medicine? Side   effects that you should report to your doctor or health care professional as soon as possible:  allergic reactions like skin rash, itching or hives, swelling of the face, lips, or tongue  fast, slow, or irregular heartbeat  right upper belly pain  severe stomach pain  signs  and symptoms of high blood sugar such as being more thirsty or hungry or having to urinate more than normal. You may also feel very tired or have blurry vision.  signs and symptoms of low blood sugar such as feeling anxious; confusion; dizziness; increased hunger; unusually weak or tired; increased sweating; shakiness; cold, clammy skin; irritable; headache; blurred vision; fast heartbeat; loss of consciousness  unusually weak or tired Side effects that usually do not require medical attention (report to your doctor or health care professional if they continue or are bothersome):  diarrhea  dizziness  gas  headache  nausea, vomiting  pain, redness, or irritation at site where injected  upset stomach This list may not describe all possible side effects. Call your doctor for medical advice about side effects. You may report side effects to FDA at 1-800-FDA-1088. Where should I keep my medicine? Keep out of the reach of children. Store in a refrigerator between 2 and 8 degrees C (36 and 46 degrees F). Protect from light. Allow to come to room temperature naturally. Do not use artificial heat. If protected from light, the injection may be stored at room temperature between 20 and 30 degrees C (70 and 86 degrees F) for 14 days. After the initial use, throw away any unused portion of a multiple dose vial after 14 days. Throw away unused portions of the ampules after use. NOTE: This sheet is a summary. It may not cover all possible information. If you have questions about this medicine, talk to your doctor, pharmacist, or health care provider.  2021 Elsevier/Gold Standard (2018-08-03 13:33:09)  

## 2020-03-11 NOTE — Progress Notes (Addendum)
PATIENT CARE CENTER NOTE   Diagnosis: Acute blood loss anemia (D62)     Provider: [name], MD     Procedure: IM Sandostatin (Octreotide)      Note: Patient received IM Sandostatin (dose 4 of 6)  injection in the left hip. Tolerated well.  AVS offered but patient refused. Patient alert, oriented and ambulatory at discharge.

## 2020-03-12 DIAGNOSIS — N2581 Secondary hyperparathyroidism of renal origin: Secondary | ICD-10-CM | POA: Diagnosis not present

## 2020-03-12 DIAGNOSIS — N186 End stage renal disease: Secondary | ICD-10-CM | POA: Diagnosis not present

## 2020-03-12 DIAGNOSIS — Z992 Dependence on renal dialysis: Secondary | ICD-10-CM | POA: Diagnosis not present

## 2020-03-14 DIAGNOSIS — Z992 Dependence on renal dialysis: Secondary | ICD-10-CM | POA: Diagnosis not present

## 2020-03-14 DIAGNOSIS — N186 End stage renal disease: Secondary | ICD-10-CM | POA: Diagnosis not present

## 2020-03-14 DIAGNOSIS — N2581 Secondary hyperparathyroidism of renal origin: Secondary | ICD-10-CM | POA: Diagnosis not present

## 2020-03-17 DIAGNOSIS — Z992 Dependence on renal dialysis: Secondary | ICD-10-CM | POA: Diagnosis not present

## 2020-03-17 DIAGNOSIS — N186 End stage renal disease: Secondary | ICD-10-CM | POA: Diagnosis not present

## 2020-03-17 DIAGNOSIS — R0600 Dyspnea, unspecified: Secondary | ICD-10-CM | POA: Diagnosis not present

## 2020-03-17 DIAGNOSIS — I5033 Acute on chronic diastolic (congestive) heart failure: Secondary | ICD-10-CM | POA: Diagnosis not present

## 2020-03-17 DIAGNOSIS — N2581 Secondary hyperparathyroidism of renal origin: Secondary | ICD-10-CM | POA: Diagnosis not present

## 2020-03-17 DIAGNOSIS — C3492 Malignant neoplasm of unspecified part of left bronchus or lung: Secondary | ICD-10-CM | POA: Diagnosis not present

## 2020-03-17 DIAGNOSIS — N032 Chronic nephritic syndrome with diffuse membranous glomerulonephritis: Secondary | ICD-10-CM | POA: Diagnosis not present

## 2020-03-19 DIAGNOSIS — N186 End stage renal disease: Secondary | ICD-10-CM | POA: Diagnosis not present

## 2020-03-19 DIAGNOSIS — N2581 Secondary hyperparathyroidism of renal origin: Secondary | ICD-10-CM | POA: Diagnosis not present

## 2020-03-19 DIAGNOSIS — Z992 Dependence on renal dialysis: Secondary | ICD-10-CM | POA: Diagnosis not present

## 2020-03-21 DIAGNOSIS — N2581 Secondary hyperparathyroidism of renal origin: Secondary | ICD-10-CM | POA: Diagnosis not present

## 2020-03-21 DIAGNOSIS — N186 End stage renal disease: Secondary | ICD-10-CM | POA: Diagnosis not present

## 2020-03-21 DIAGNOSIS — Z992 Dependence on renal dialysis: Secondary | ICD-10-CM | POA: Diagnosis not present

## 2020-03-24 DIAGNOSIS — N2581 Secondary hyperparathyroidism of renal origin: Secondary | ICD-10-CM | POA: Diagnosis not present

## 2020-03-24 DIAGNOSIS — Z992 Dependence on renal dialysis: Secondary | ICD-10-CM | POA: Diagnosis not present

## 2020-03-24 DIAGNOSIS — N186 End stage renal disease: Secondary | ICD-10-CM | POA: Diagnosis not present

## 2020-03-26 DIAGNOSIS — N186 End stage renal disease: Secondary | ICD-10-CM | POA: Diagnosis not present

## 2020-03-26 DIAGNOSIS — N2581 Secondary hyperparathyroidism of renal origin: Secondary | ICD-10-CM | POA: Diagnosis not present

## 2020-03-26 DIAGNOSIS — Z992 Dependence on renal dialysis: Secondary | ICD-10-CM | POA: Diagnosis not present

## 2020-03-28 DIAGNOSIS — N186 End stage renal disease: Secondary | ICD-10-CM | POA: Diagnosis not present

## 2020-03-28 DIAGNOSIS — N2581 Secondary hyperparathyroidism of renal origin: Secondary | ICD-10-CM | POA: Diagnosis not present

## 2020-03-28 DIAGNOSIS — Z992 Dependence on renal dialysis: Secondary | ICD-10-CM | POA: Diagnosis not present

## 2020-03-31 DIAGNOSIS — Z992 Dependence on renal dialysis: Secondary | ICD-10-CM | POA: Diagnosis not present

## 2020-03-31 DIAGNOSIS — N186 End stage renal disease: Secondary | ICD-10-CM | POA: Diagnosis not present

## 2020-03-31 DIAGNOSIS — N2581 Secondary hyperparathyroidism of renal origin: Secondary | ICD-10-CM | POA: Diagnosis not present

## 2020-04-02 DIAGNOSIS — Z992 Dependence on renal dialysis: Secondary | ICD-10-CM | POA: Diagnosis not present

## 2020-04-02 DIAGNOSIS — N186 End stage renal disease: Secondary | ICD-10-CM | POA: Diagnosis not present

## 2020-04-02 DIAGNOSIS — N2581 Secondary hyperparathyroidism of renal origin: Secondary | ICD-10-CM | POA: Diagnosis not present

## 2020-04-04 DIAGNOSIS — N186 End stage renal disease: Secondary | ICD-10-CM | POA: Diagnosis not present

## 2020-04-04 DIAGNOSIS — Z992 Dependence on renal dialysis: Secondary | ICD-10-CM | POA: Diagnosis not present

## 2020-04-04 DIAGNOSIS — N2581 Secondary hyperparathyroidism of renal origin: Secondary | ICD-10-CM | POA: Diagnosis not present

## 2020-04-07 DIAGNOSIS — Z992 Dependence on renal dialysis: Secondary | ICD-10-CM | POA: Diagnosis not present

## 2020-04-07 DIAGNOSIS — N2581 Secondary hyperparathyroidism of renal origin: Secondary | ICD-10-CM | POA: Diagnosis not present

## 2020-04-07 DIAGNOSIS — N186 End stage renal disease: Secondary | ICD-10-CM | POA: Diagnosis not present

## 2020-04-08 ENCOUNTER — Other Ambulatory Visit: Payer: Self-pay

## 2020-04-08 ENCOUNTER — Non-Acute Institutional Stay (HOSPITAL_COMMUNITY)
Admission: RE | Admit: 2020-04-08 | Discharge: 2020-04-08 | Disposition: A | Discharge status: Home / Self Care | Payer: Medicare PPO | Source: Ambulatory Visit | Attending: Internal Medicine | Admitting: Internal Medicine

## 2020-04-08 DIAGNOSIS — K5521 Angiodysplasia of colon with hemorrhage: Secondary | ICD-10-CM | POA: Diagnosis not present

## 2020-04-08 DIAGNOSIS — D500 Iron deficiency anemia secondary to blood loss (chronic): Secondary | ICD-10-CM | POA: Insufficient documentation

## 2020-04-08 MED ORDER — OCTREOTIDE ACETATE 20 MG IM KIT
20.0000 mg | PACK | Freq: Once | INTRAMUSCULAR | Status: AC
  Administered 2020-04-08: 20 mg via INTRAMUSCULAR
  Filled 2020-04-08: qty 1

## 2020-04-08 NOTE — Progress Notes (Signed)
PATIENT CARE CENTER NOTE  Diagnosis:Acute blood loss anemia (D62)   Provider:Beavers, Joelene Millin, MD   Procedure:IM Sandostatin (Octreotide)   Note:Patient received IM Sandostatin (dose 5 of 6)  injection in the left hip. Tolerated well. AVS offered but patient refused. Patient alert, oriented and ambulatory at discharge.

## 2020-04-08 NOTE — Discharge Instructions (Signed)
Octreotide acetate injection suspension What is this medicine? OCTREOTIDE (ok TREE oh tide) is used to reduce blood levels of growth hormone in patients with a condition called acromegaly. This medicine also reduces flushing and watery diarrhea caused by certain types of cancer. This medicine may be used for other purposes; ask your health care provider or pharmacist if you have questions. COMMON BRAND NAME(S): Sandostatin LAR What should I tell my health care provider before I take this medicine? They need to know if you have any of these conditions:  diabetes  gallbladder disease  kidney disease  liver disease  thyroid disease  an unusual or allergic reaction to octreotide, other medicines, foods, dyes, or preservatives  pregnant or trying to get pregnant  breast-feeding How should I use this medicine? This medicine is for injection into a muscle. It is usually given by a health care professional in a hospital or clinic setting. Talk to your pediatrician regarding the use of this medicine in children. Special care may be needed. Overdosage: If you think you have taken too much of this medicine contact a poison control center or emergency room at once. NOTE: This medicine is only for you. Do not share this medicine with others. What if I miss a dose? Keep appointments for follow-up doses. It is important not to miss your dose. Call your doctor or health care professional if you are unable to keep an appointment. What may interact with this medicine? Do not take this medicine with any of the following medications:  cisapride  dronedarone  flibanserin  lutetium Lu 177 dotatate  pimozide  saquinavir  thioridazine This medicine may also interact with the following medications:  bromocriptine  certain medicines for blood pressure, heart disease, irregular heartbeat  cyclosporine  diuretics  medicines for diabetes, including insulin  quinidine This list may not  describe all possible interactions. Give your health care provider a list of all the medicines, herbs, non-prescription drugs, or dietary supplements you use. Also tell them if you smoke, drink alcohol, or use illegal drugs. Some items may interact with your medicine. What should I watch for while using this medicine? Visit your health care professional for regular checks on your progress. Tell your health care professional if your symptoms do not start to get better or if they get worse. This medicine may cause decreases in blood sugar. Signs of low blood sugar include chills, cool, pale skin or cold sweats, drowsiness, extreme hunger, fast heartbeat, headache, nausea, nervousness or anxiety, shakiness, trembling, unsteadiness, tiredness, or weakness. Contact your doctor or health care professional right away if you experience any of these symptoms. This medicine may increase blood sugar. Ask your healthcare provider if changes in diet or medicines are needed if you have diabetes. This medicine may cause a decrease in vitamin B12. You should make sure that you get enough vitamin B12 while you are taking this medicine. Discuss the foods you eat and the vitamins you take with your health care professional. What side effects may I notice from receiving this medicine? Side effects that you should report to your doctor or health care professional as soon as possible:  allergic reactions like skin rash, itching or hives, swelling of the face, lips, or tongue  fast, slow, or irregular heartbeat  right upper belly pain  severe stomach pain  signs and symptoms of high blood sugar such as being more thirsty or hungry or having to urinate more than normal. You may also feel very tired or have  blurry vision.  signs and symptoms of low blood sugar such as feeling anxious; confusion; dizziness; increased hunger; unusually weak or tired; increased sweating; shakiness; cold, clammy skin; irritable; headache;  blurred vision; fast heartbeat; loss of consciousness  unusually weak or tired Side effects that usually do not require medical attention (report these to your doctor or health care professional if they continue or are bothersome):  diarrhea  dizziness  gas  headache  nausea, vomiting  pain, redness, or irritation at site where injected  upset stomach This list may not describe all possible side effects. Call your doctor for medical advice about side effects. You may report side effects to FDA at 1-800-FDA-1088. Where should I keep my medicine? This medicine is given in a hospital or clinic and will not be stored at home. NOTE: This sheet is a summary. It may not cover all possible information. If you have questions about this medicine, talk to your doctor, pharmacist, or health care provider.  2021 Elsevier/Gold Standard (2019-04-24 18:31:50)

## 2020-04-09 DIAGNOSIS — N186 End stage renal disease: Secondary | ICD-10-CM | POA: Diagnosis not present

## 2020-04-09 DIAGNOSIS — N2581 Secondary hyperparathyroidism of renal origin: Secondary | ICD-10-CM | POA: Diagnosis not present

## 2020-04-09 DIAGNOSIS — Z992 Dependence on renal dialysis: Secondary | ICD-10-CM | POA: Diagnosis not present

## 2020-04-11 DIAGNOSIS — Z992 Dependence on renal dialysis: Secondary | ICD-10-CM | POA: Diagnosis not present

## 2020-04-11 DIAGNOSIS — N186 End stage renal disease: Secondary | ICD-10-CM | POA: Diagnosis not present

## 2020-04-11 DIAGNOSIS — N2581 Secondary hyperparathyroidism of renal origin: Secondary | ICD-10-CM | POA: Diagnosis not present

## 2020-04-14 DIAGNOSIS — I5033 Acute on chronic diastolic (congestive) heart failure: Secondary | ICD-10-CM | POA: Diagnosis not present

## 2020-04-14 DIAGNOSIS — N186 End stage renal disease: Secondary | ICD-10-CM | POA: Diagnosis not present

## 2020-04-14 DIAGNOSIS — N2581 Secondary hyperparathyroidism of renal origin: Secondary | ICD-10-CM | POA: Diagnosis not present

## 2020-04-14 DIAGNOSIS — C3492 Malignant neoplasm of unspecified part of left bronchus or lung: Secondary | ICD-10-CM | POA: Diagnosis not present

## 2020-04-14 DIAGNOSIS — R0600 Dyspnea, unspecified: Secondary | ICD-10-CM | POA: Diagnosis not present

## 2020-04-14 DIAGNOSIS — Z992 Dependence on renal dialysis: Secondary | ICD-10-CM | POA: Diagnosis not present

## 2020-04-15 ENCOUNTER — Encounter (HOSPITAL_COMMUNITY): Payer: Medicare PPO

## 2020-04-16 DIAGNOSIS — Z992 Dependence on renal dialysis: Secondary | ICD-10-CM | POA: Diagnosis not present

## 2020-04-16 DIAGNOSIS — N2581 Secondary hyperparathyroidism of renal origin: Secondary | ICD-10-CM | POA: Diagnosis not present

## 2020-04-16 DIAGNOSIS — N186 End stage renal disease: Secondary | ICD-10-CM | POA: Diagnosis not present

## 2020-04-17 ENCOUNTER — Telehealth: Payer: Self-pay | Admitting: Family Medicine

## 2020-04-17 DIAGNOSIS — N032 Chronic nephritic syndrome with diffuse membranous glomerulonephritis: Secondary | ICD-10-CM | POA: Diagnosis not present

## 2020-04-17 DIAGNOSIS — N186 End stage renal disease: Secondary | ICD-10-CM | POA: Diagnosis not present

## 2020-04-17 DIAGNOSIS — Z992 Dependence on renal dialysis: Secondary | ICD-10-CM | POA: Diagnosis not present

## 2020-04-17 NOTE — Telephone Encounter (Signed)
Patient is wanting a phone visit to talk about his general health . Tried to explain he would need office visit because he hasnt been seen in office since 11/09/17 the last visit were by phone only. Please advise

## 2020-04-18 DIAGNOSIS — N2581 Secondary hyperparathyroidism of renal origin: Secondary | ICD-10-CM | POA: Diagnosis not present

## 2020-04-18 DIAGNOSIS — Z992 Dependence on renal dialysis: Secondary | ICD-10-CM | POA: Diagnosis not present

## 2020-04-18 DIAGNOSIS — N186 End stage renal disease: Secondary | ICD-10-CM | POA: Diagnosis not present

## 2020-04-20 NOTE — Telephone Encounter (Signed)
I am willing to do a phone visit either at the end of the morning or end of the afternoon to discuss how things are going for him then at that point we could arrange an in person visit for sometimes later in the spring thanks.  I am willing to do this late morning or late afternoon this week but I cannot do it on Wednesday morning due to prior obligations

## 2020-04-21 DIAGNOSIS — Z992 Dependence on renal dialysis: Secondary | ICD-10-CM | POA: Diagnosis not present

## 2020-04-21 DIAGNOSIS — N2581 Secondary hyperparathyroidism of renal origin: Secondary | ICD-10-CM | POA: Diagnosis not present

## 2020-04-21 DIAGNOSIS — N186 End stage renal disease: Secondary | ICD-10-CM | POA: Diagnosis not present

## 2020-04-21 NOTE — Telephone Encounter (Signed)
Please see dr scott's note below and schedule appt thanks

## 2020-04-21 NOTE — Telephone Encounter (Signed)
Scheduled phone visit-4/25 at 1:20

## 2020-04-23 DIAGNOSIS — Z992 Dependence on renal dialysis: Secondary | ICD-10-CM | POA: Diagnosis not present

## 2020-04-23 DIAGNOSIS — N2581 Secondary hyperparathyroidism of renal origin: Secondary | ICD-10-CM | POA: Diagnosis not present

## 2020-04-23 DIAGNOSIS — N186 End stage renal disease: Secondary | ICD-10-CM | POA: Diagnosis not present

## 2020-04-25 DIAGNOSIS — Z992 Dependence on renal dialysis: Secondary | ICD-10-CM | POA: Diagnosis not present

## 2020-04-25 DIAGNOSIS — N186 End stage renal disease: Secondary | ICD-10-CM | POA: Diagnosis not present

## 2020-04-25 DIAGNOSIS — N2581 Secondary hyperparathyroidism of renal origin: Secondary | ICD-10-CM | POA: Diagnosis not present

## 2020-04-28 DIAGNOSIS — N2581 Secondary hyperparathyroidism of renal origin: Secondary | ICD-10-CM | POA: Diagnosis not present

## 2020-04-28 DIAGNOSIS — Z992 Dependence on renal dialysis: Secondary | ICD-10-CM | POA: Diagnosis not present

## 2020-04-28 DIAGNOSIS — N186 End stage renal disease: Secondary | ICD-10-CM | POA: Diagnosis not present

## 2020-04-30 DIAGNOSIS — Z992 Dependence on renal dialysis: Secondary | ICD-10-CM | POA: Diagnosis not present

## 2020-04-30 DIAGNOSIS — N186 End stage renal disease: Secondary | ICD-10-CM | POA: Diagnosis not present

## 2020-04-30 DIAGNOSIS — N2581 Secondary hyperparathyroidism of renal origin: Secondary | ICD-10-CM | POA: Diagnosis not present

## 2020-05-02 DIAGNOSIS — N2581 Secondary hyperparathyroidism of renal origin: Secondary | ICD-10-CM | POA: Diagnosis not present

## 2020-05-02 DIAGNOSIS — N186 End stage renal disease: Secondary | ICD-10-CM | POA: Diagnosis not present

## 2020-05-02 DIAGNOSIS — Z992 Dependence on renal dialysis: Secondary | ICD-10-CM | POA: Diagnosis not present

## 2020-05-05 DIAGNOSIS — N186 End stage renal disease: Secondary | ICD-10-CM | POA: Diagnosis not present

## 2020-05-05 DIAGNOSIS — N2581 Secondary hyperparathyroidism of renal origin: Secondary | ICD-10-CM | POA: Diagnosis not present

## 2020-05-05 DIAGNOSIS — Z992 Dependence on renal dialysis: Secondary | ICD-10-CM | POA: Diagnosis not present

## 2020-05-07 DIAGNOSIS — N186 End stage renal disease: Secondary | ICD-10-CM | POA: Diagnosis not present

## 2020-05-07 DIAGNOSIS — N2581 Secondary hyperparathyroidism of renal origin: Secondary | ICD-10-CM | POA: Diagnosis not present

## 2020-05-07 DIAGNOSIS — Z992 Dependence on renal dialysis: Secondary | ICD-10-CM | POA: Diagnosis not present

## 2020-05-08 DIAGNOSIS — I771 Stricture of artery: Secondary | ICD-10-CM | POA: Diagnosis not present

## 2020-05-08 DIAGNOSIS — Z992 Dependence on renal dialysis: Secondary | ICD-10-CM | POA: Diagnosis not present

## 2020-05-08 DIAGNOSIS — N186 End stage renal disease: Secondary | ICD-10-CM | POA: Diagnosis not present

## 2020-05-09 DIAGNOSIS — N186 End stage renal disease: Secondary | ICD-10-CM | POA: Diagnosis not present

## 2020-05-09 DIAGNOSIS — N2581 Secondary hyperparathyroidism of renal origin: Secondary | ICD-10-CM | POA: Diagnosis not present

## 2020-05-09 DIAGNOSIS — Z992 Dependence on renal dialysis: Secondary | ICD-10-CM | POA: Diagnosis not present

## 2020-05-12 ENCOUNTER — Telehealth: Payer: Medicare Other | Admitting: Family Medicine

## 2020-05-12 DIAGNOSIS — Z992 Dependence on renal dialysis: Secondary | ICD-10-CM | POA: Diagnosis not present

## 2020-05-12 DIAGNOSIS — N186 End stage renal disease: Secondary | ICD-10-CM | POA: Diagnosis not present

## 2020-05-12 DIAGNOSIS — N2581 Secondary hyperparathyroidism of renal origin: Secondary | ICD-10-CM | POA: Diagnosis not present

## 2020-05-13 ENCOUNTER — Other Ambulatory Visit: Payer: Self-pay

## 2020-05-13 ENCOUNTER — Non-Acute Institutional Stay (HOSPITAL_COMMUNITY)
Admission: RE | Admit: 2020-05-13 | Discharge: 2020-05-13 | Disposition: A | Discharge status: Home / Self Care | Payer: Medicare PPO | Source: Ambulatory Visit | Attending: Internal Medicine | Admitting: Internal Medicine

## 2020-05-13 DIAGNOSIS — K5520 Angiodysplasia of colon without hemorrhage: Secondary | ICD-10-CM

## 2020-05-13 DIAGNOSIS — D62 Acute posthemorrhagic anemia: Secondary | ICD-10-CM | POA: Diagnosis not present

## 2020-05-13 MED ORDER — OCTREOTIDE ACETATE 20 MG IM KIT
20.0000 mg | PACK | Freq: Once | INTRAMUSCULAR | Status: AC
  Administered 2020-05-13: 20 mg via INTRAMUSCULAR
  Filled 2020-05-13: qty 1

## 2020-05-13 NOTE — Progress Notes (Signed)
PATIENT CARE CENTER NOTE  Diagnosis:Acute blood loss anemia (D62)   Provider:Beavers, Joelene Millin, MD   Procedure:IM Sandostatin (Octreotide)   Note:Patient received IM Sandostatin(dose 6 of 6)injection in the left hip. Tolerated well. AVS offered but patient refused. Patient alert, oriented and ambulatory at discharge.

## 2020-05-14 DIAGNOSIS — N2581 Secondary hyperparathyroidism of renal origin: Secondary | ICD-10-CM | POA: Diagnosis not present

## 2020-05-14 DIAGNOSIS — N186 End stage renal disease: Secondary | ICD-10-CM | POA: Diagnosis not present

## 2020-05-14 DIAGNOSIS — Z992 Dependence on renal dialysis: Secondary | ICD-10-CM | POA: Diagnosis not present

## 2020-05-15 DIAGNOSIS — R0600 Dyspnea, unspecified: Secondary | ICD-10-CM | POA: Diagnosis not present

## 2020-05-15 DIAGNOSIS — C3492 Malignant neoplasm of unspecified part of left bronchus or lung: Secondary | ICD-10-CM | POA: Diagnosis not present

## 2020-05-15 DIAGNOSIS — I5033 Acute on chronic diastolic (congestive) heart failure: Secondary | ICD-10-CM | POA: Diagnosis not present

## 2020-05-16 DIAGNOSIS — N186 End stage renal disease: Secondary | ICD-10-CM | POA: Diagnosis not present

## 2020-05-16 DIAGNOSIS — N2581 Secondary hyperparathyroidism of renal origin: Secondary | ICD-10-CM | POA: Diagnosis not present

## 2020-05-16 DIAGNOSIS — Z992 Dependence on renal dialysis: Secondary | ICD-10-CM | POA: Diagnosis not present

## 2020-05-17 DIAGNOSIS — N032 Chronic nephritic syndrome with diffuse membranous glomerulonephritis: Secondary | ICD-10-CM | POA: Diagnosis not present

## 2020-05-17 DIAGNOSIS — N186 End stage renal disease: Secondary | ICD-10-CM | POA: Diagnosis not present

## 2020-05-17 DIAGNOSIS — Z992 Dependence on renal dialysis: Secondary | ICD-10-CM | POA: Diagnosis not present

## 2020-05-19 DIAGNOSIS — N2581 Secondary hyperparathyroidism of renal origin: Secondary | ICD-10-CM | POA: Diagnosis not present

## 2020-05-19 DIAGNOSIS — Z992 Dependence on renal dialysis: Secondary | ICD-10-CM | POA: Diagnosis not present

## 2020-05-19 DIAGNOSIS — N186 End stage renal disease: Secondary | ICD-10-CM | POA: Diagnosis not present

## 2020-05-20 ENCOUNTER — Telehealth (INDEPENDENT_AMBULATORY_CARE_PROVIDER_SITE_OTHER): Payer: Medicare PPO | Admitting: Family Medicine

## 2020-05-20 ENCOUNTER — Other Ambulatory Visit: Payer: Self-pay

## 2020-05-20 DIAGNOSIS — Z992 Dependence on renal dialysis: Secondary | ICD-10-CM

## 2020-05-20 DIAGNOSIS — N186 End stage renal disease: Secondary | ICD-10-CM | POA: Diagnosis not present

## 2020-05-20 DIAGNOSIS — I10 Essential (primary) hypertension: Secondary | ICD-10-CM | POA: Diagnosis not present

## 2020-05-20 NOTE — Progress Notes (Signed)
   Subjective:    Patient ID: Andrew Guerra, male    DOB: 1940-03-09, 80 y.o.   MRN: [id number]  Hypertension This is a chronic problem. The current episode started more than 1 year ago. Treatments tried: norvasc, coreg, apresoline.   Had good discussion with the patient Currently he is no longer taking Norvasc or Apresoline He is getting dialysis 3 times a week Has lost some weight down to 152 Trying to eat healthy Trying to stay as active as he can Has not followed up with any specialist recently other than the kidney doctor They are doing lab work on him He has had some problems with GI bleed but that is stable currently    Review of Systems Virtual Visit via Telephone Note  I connected with [name] on 05/20/20 at  2:20 PM EDT by telephone and verified that I am speaking with the correct person using two identifiers.  Location: Patient: home Provider: office   I discussed the limitations, risks, security and privacy concerns of performing an evaluation and management service by telephone and the availability of in person appointments. I also discussed with the patient that there may be a patient responsible charge related to this service. The patient expressed understanding and agreed to proceed.   History of Present Illness:    Observations/Objective:   Assessment and Plan:   Follow Up Instructions:    I discussed the assessment and treatment plan with the patient. The patient was provided an opportunity to ask questions and all were answered. The patient agreed with the plan and demonstrated an understanding of the instructions.   The patient was advised to call back or seek an in-person evaluation if the symptoms worsen or if the condition fails to improve as anticipated.  I provided 25 minutes of non-face-to-face time during this encounter.        Objective:   Physical Exam   Today's visit was via telephone Physical exam was not possible for this  visit      Assessment & Plan:  1. Chronic kidney disease requiring chronic dialysis ([place]) Dialysis 3 times a week recent labs look good healthy diet recommended  2. Essential hypertension, benign Blood pressure has been stable on less medication  Has not followed through with any specialist recently  Follow-up if any progressive troubles or problems We will retouch base with him later in August I told the patient if he wants to be seen here we can easily accommodate him otherwise If he would like for [name] to drop by and see him in [place] I could do so when I am in that vicinity

## 2020-05-21 DIAGNOSIS — Z992 Dependence on renal dialysis: Secondary | ICD-10-CM | POA: Diagnosis not present

## 2020-05-21 DIAGNOSIS — N186 End stage renal disease: Secondary | ICD-10-CM | POA: Diagnosis not present

## 2020-05-21 DIAGNOSIS — N2581 Secondary hyperparathyroidism of renal origin: Secondary | ICD-10-CM | POA: Diagnosis not present

## 2020-05-23 DIAGNOSIS — N2581 Secondary hyperparathyroidism of renal origin: Secondary | ICD-10-CM | POA: Diagnosis not present

## 2020-05-23 DIAGNOSIS — N186 End stage renal disease: Secondary | ICD-10-CM | POA: Diagnosis not present

## 2020-05-23 DIAGNOSIS — Z992 Dependence on renal dialysis: Secondary | ICD-10-CM | POA: Diagnosis not present

## 2020-05-26 DIAGNOSIS — Z992 Dependence on renal dialysis: Secondary | ICD-10-CM | POA: Diagnosis not present

## 2020-05-26 DIAGNOSIS — N2581 Secondary hyperparathyroidism of renal origin: Secondary | ICD-10-CM | POA: Diagnosis not present

## 2020-05-26 DIAGNOSIS — N186 End stage renal disease: Secondary | ICD-10-CM | POA: Diagnosis not present

## 2020-05-28 DIAGNOSIS — N186 End stage renal disease: Secondary | ICD-10-CM | POA: Diagnosis not present

## 2020-05-28 DIAGNOSIS — N2581 Secondary hyperparathyroidism of renal origin: Secondary | ICD-10-CM | POA: Diagnosis not present

## 2020-05-28 DIAGNOSIS — Z992 Dependence on renal dialysis: Secondary | ICD-10-CM | POA: Diagnosis not present

## 2020-05-30 DIAGNOSIS — N2581 Secondary hyperparathyroidism of renal origin: Secondary | ICD-10-CM | POA: Diagnosis not present

## 2020-05-30 DIAGNOSIS — N186 End stage renal disease: Secondary | ICD-10-CM | POA: Diagnosis not present

## 2020-05-30 DIAGNOSIS — Z992 Dependence on renal dialysis: Secondary | ICD-10-CM | POA: Diagnosis not present

## 2020-06-02 DIAGNOSIS — N2581 Secondary hyperparathyroidism of renal origin: Secondary | ICD-10-CM | POA: Diagnosis not present

## 2020-06-02 DIAGNOSIS — N186 End stage renal disease: Secondary | ICD-10-CM | POA: Diagnosis not present

## 2020-06-02 DIAGNOSIS — Z992 Dependence on renal dialysis: Secondary | ICD-10-CM | POA: Diagnosis not present

## 2020-06-04 DIAGNOSIS — Z992 Dependence on renal dialysis: Secondary | ICD-10-CM | POA: Diagnosis not present

## 2020-06-04 DIAGNOSIS — N186 End stage renal disease: Secondary | ICD-10-CM | POA: Diagnosis not present

## 2020-06-04 DIAGNOSIS — N2581 Secondary hyperparathyroidism of renal origin: Secondary | ICD-10-CM | POA: Diagnosis not present

## 2020-06-06 DIAGNOSIS — N186 End stage renal disease: Secondary | ICD-10-CM | POA: Diagnosis not present

## 2020-06-06 DIAGNOSIS — N2581 Secondary hyperparathyroidism of renal origin: Secondary | ICD-10-CM | POA: Diagnosis not present

## 2020-06-06 DIAGNOSIS — Z992 Dependence on renal dialysis: Secondary | ICD-10-CM | POA: Diagnosis not present

## 2020-06-09 DIAGNOSIS — N186 End stage renal disease: Secondary | ICD-10-CM | POA: Diagnosis not present

## 2020-06-09 DIAGNOSIS — Z992 Dependence on renal dialysis: Secondary | ICD-10-CM | POA: Diagnosis not present

## 2020-06-09 DIAGNOSIS — N2581 Secondary hyperparathyroidism of renal origin: Secondary | ICD-10-CM | POA: Diagnosis not present

## 2020-06-11 DIAGNOSIS — Z992 Dependence on renal dialysis: Secondary | ICD-10-CM | POA: Diagnosis not present

## 2020-06-11 DIAGNOSIS — N2581 Secondary hyperparathyroidism of renal origin: Secondary | ICD-10-CM | POA: Diagnosis not present

## 2020-06-11 DIAGNOSIS — N186 End stage renal disease: Secondary | ICD-10-CM | POA: Diagnosis not present

## 2020-06-12 ENCOUNTER — Non-Acute Institutional Stay (HOSPITAL_COMMUNITY)
Admission: RE | Admit: 2020-06-12 | Discharge: 2020-06-12 | Disposition: A | Discharge status: Home / Self Care | Payer: Medicare PPO | Source: Ambulatory Visit | Attending: Internal Medicine | Admitting: Internal Medicine

## 2020-06-12 ENCOUNTER — Other Ambulatory Visit: Payer: Self-pay

## 2020-06-12 DIAGNOSIS — D62 Acute posthemorrhagic anemia: Secondary | ICD-10-CM | POA: Insufficient documentation

## 2020-06-12 DIAGNOSIS — K5520 Angiodysplasia of colon without hemorrhage: Secondary | ICD-10-CM

## 2020-06-12 MED ORDER — OCTREOTIDE ACETATE 20 MG IM KIT
20.0000 mg | PACK | INTRAMUSCULAR | Status: DC
Start: 2020-06-12 — End: 2020-06-13
  Administered 2020-06-12: 20 mg via INTRAMUSCULAR
  Filled 2020-06-12: qty 1

## 2020-06-12 NOTE — Progress Notes (Signed)
PATIENT CARE CENTER NOTE  Diagnosis:Acute blood loss anemia (D62)   Provider:Beavers, Joelene Millin, MD   Procedure:IM Sandostatin (Octreotide)   Note:Patient received IM Sandostatin(dose1of 6)injection in the left hip. Tolerated well. Vital signs stable. AVS offered but patient refused. Patient alert, oriented and ambulatory at discharge.

## 2020-06-13 DIAGNOSIS — Z992 Dependence on renal dialysis: Secondary | ICD-10-CM | POA: Diagnosis not present

## 2020-06-13 DIAGNOSIS — N2581 Secondary hyperparathyroidism of renal origin: Secondary | ICD-10-CM | POA: Diagnosis not present

## 2020-06-13 DIAGNOSIS — N186 End stage renal disease: Secondary | ICD-10-CM | POA: Diagnosis not present

## 2020-06-14 DIAGNOSIS — C3492 Malignant neoplasm of unspecified part of left bronchus or lung: Secondary | ICD-10-CM | POA: Diagnosis not present

## 2020-06-14 DIAGNOSIS — I5033 Acute on chronic diastolic (congestive) heart failure: Secondary | ICD-10-CM | POA: Diagnosis not present

## 2020-06-14 DIAGNOSIS — R0600 Dyspnea, unspecified: Secondary | ICD-10-CM | POA: Diagnosis not present

## 2020-06-16 DIAGNOSIS — Z992 Dependence on renal dialysis: Secondary | ICD-10-CM | POA: Diagnosis not present

## 2020-06-16 DIAGNOSIS — N2581 Secondary hyperparathyroidism of renal origin: Secondary | ICD-10-CM | POA: Diagnosis not present

## 2020-06-16 DIAGNOSIS — N186 End stage renal disease: Secondary | ICD-10-CM | POA: Diagnosis not present

## 2020-06-17 DIAGNOSIS — N032 Chronic nephritic syndrome with diffuse membranous glomerulonephritis: Secondary | ICD-10-CM | POA: Diagnosis not present

## 2020-06-17 DIAGNOSIS — Z992 Dependence on renal dialysis: Secondary | ICD-10-CM | POA: Diagnosis not present

## 2020-06-17 DIAGNOSIS — N186 End stage renal disease: Secondary | ICD-10-CM | POA: Diagnosis not present

## 2020-06-18 DIAGNOSIS — N186 End stage renal disease: Secondary | ICD-10-CM | POA: Diagnosis not present

## 2020-06-18 DIAGNOSIS — N2581 Secondary hyperparathyroidism of renal origin: Secondary | ICD-10-CM | POA: Diagnosis not present

## 2020-06-18 DIAGNOSIS — Z992 Dependence on renal dialysis: Secondary | ICD-10-CM | POA: Diagnosis not present

## 2020-06-20 DIAGNOSIS — Z992 Dependence on renal dialysis: Secondary | ICD-10-CM | POA: Diagnosis not present

## 2020-06-20 DIAGNOSIS — N186 End stage renal disease: Secondary | ICD-10-CM | POA: Diagnosis not present

## 2020-06-20 DIAGNOSIS — N2581 Secondary hyperparathyroidism of renal origin: Secondary | ICD-10-CM | POA: Diagnosis not present

## 2020-06-23 DIAGNOSIS — N186 End stage renal disease: Secondary | ICD-10-CM | POA: Diagnosis not present

## 2020-06-23 DIAGNOSIS — Z992 Dependence on renal dialysis: Secondary | ICD-10-CM | POA: Diagnosis not present

## 2020-06-23 DIAGNOSIS — N2581 Secondary hyperparathyroidism of renal origin: Secondary | ICD-10-CM | POA: Diagnosis not present

## 2020-06-25 DIAGNOSIS — N2581 Secondary hyperparathyroidism of renal origin: Secondary | ICD-10-CM | POA: Diagnosis not present

## 2020-06-25 DIAGNOSIS — N186 End stage renal disease: Secondary | ICD-10-CM | POA: Diagnosis not present

## 2020-06-25 DIAGNOSIS — Z992 Dependence on renal dialysis: Secondary | ICD-10-CM | POA: Diagnosis not present

## 2020-06-28 DIAGNOSIS — N2581 Secondary hyperparathyroidism of renal origin: Secondary | ICD-10-CM | POA: Diagnosis not present

## 2020-06-28 DIAGNOSIS — Z992 Dependence on renal dialysis: Secondary | ICD-10-CM | POA: Diagnosis not present

## 2020-06-28 DIAGNOSIS — N186 End stage renal disease: Secondary | ICD-10-CM | POA: Diagnosis not present

## 2020-06-30 DIAGNOSIS — N2581 Secondary hyperparathyroidism of renal origin: Secondary | ICD-10-CM | POA: Diagnosis not present

## 2020-06-30 DIAGNOSIS — N186 End stage renal disease: Secondary | ICD-10-CM | POA: Diagnosis not present

## 2020-06-30 DIAGNOSIS — Z992 Dependence on renal dialysis: Secondary | ICD-10-CM | POA: Diagnosis not present

## 2020-07-02 DIAGNOSIS — Z992 Dependence on renal dialysis: Secondary | ICD-10-CM | POA: Diagnosis not present

## 2020-07-02 DIAGNOSIS — N186 End stage renal disease: Secondary | ICD-10-CM | POA: Diagnosis not present

## 2020-07-02 DIAGNOSIS — N2581 Secondary hyperparathyroidism of renal origin: Secondary | ICD-10-CM | POA: Diagnosis not present

## 2020-07-04 DIAGNOSIS — N186 End stage renal disease: Secondary | ICD-10-CM | POA: Diagnosis not present

## 2020-07-04 DIAGNOSIS — N2581 Secondary hyperparathyroidism of renal origin: Secondary | ICD-10-CM | POA: Diagnosis not present

## 2020-07-04 DIAGNOSIS — Z992 Dependence on renal dialysis: Secondary | ICD-10-CM | POA: Diagnosis not present

## 2020-07-07 DIAGNOSIS — N2581 Secondary hyperparathyroidism of renal origin: Secondary | ICD-10-CM | POA: Diagnosis not present

## 2020-07-07 DIAGNOSIS — Z992 Dependence on renal dialysis: Secondary | ICD-10-CM | POA: Diagnosis not present

## 2020-07-07 DIAGNOSIS — N186 End stage renal disease: Secondary | ICD-10-CM | POA: Diagnosis not present

## 2020-07-09 DIAGNOSIS — N2581 Secondary hyperparathyroidism of renal origin: Secondary | ICD-10-CM | POA: Diagnosis not present

## 2020-07-09 DIAGNOSIS — N186 End stage renal disease: Secondary | ICD-10-CM | POA: Diagnosis not present

## 2020-07-09 DIAGNOSIS — Z992 Dependence on renal dialysis: Secondary | ICD-10-CM | POA: Diagnosis not present

## 2020-07-11 DIAGNOSIS — N186 End stage renal disease: Secondary | ICD-10-CM | POA: Diagnosis not present

## 2020-07-11 DIAGNOSIS — N2581 Secondary hyperparathyroidism of renal origin: Secondary | ICD-10-CM | POA: Diagnosis not present

## 2020-07-11 DIAGNOSIS — Z992 Dependence on renal dialysis: Secondary | ICD-10-CM | POA: Diagnosis not present

## 2020-07-14 DIAGNOSIS — Z992 Dependence on renal dialysis: Secondary | ICD-10-CM | POA: Diagnosis not present

## 2020-07-14 DIAGNOSIS — N186 End stage renal disease: Secondary | ICD-10-CM | POA: Diagnosis not present

## 2020-07-14 DIAGNOSIS — N2581 Secondary hyperparathyroidism of renal origin: Secondary | ICD-10-CM | POA: Diagnosis not present

## 2020-07-15 ENCOUNTER — Non-Acute Institutional Stay (HOSPITAL_COMMUNITY)
Admission: RE | Admit: 2020-07-15 | Discharge: 2020-07-15 | Disposition: A | Discharge status: Home / Self Care | Payer: Medicare PPO | Source: Ambulatory Visit | Attending: Internal Medicine | Admitting: Internal Medicine

## 2020-07-15 ENCOUNTER — Other Ambulatory Visit: Payer: Self-pay

## 2020-07-15 DIAGNOSIS — I5033 Acute on chronic diastolic (congestive) heart failure: Secondary | ICD-10-CM | POA: Diagnosis not present

## 2020-07-15 DIAGNOSIS — D62 Acute posthemorrhagic anemia: Secondary | ICD-10-CM | POA: Insufficient documentation

## 2020-07-15 DIAGNOSIS — R0600 Dyspnea, unspecified: Secondary | ICD-10-CM | POA: Diagnosis not present

## 2020-07-15 DIAGNOSIS — C3492 Malignant neoplasm of unspecified part of left bronchus or lung: Secondary | ICD-10-CM | POA: Diagnosis not present

## 2020-07-15 MED ORDER — OCTREOTIDE ACETATE 20 MG IM KIT
20.0000 mg | PACK | Freq: Once | INTRAMUSCULAR | Status: AC
  Administered 2020-07-15: 20 mg via INTRAMUSCULAR
  Filled 2020-07-15: qty 1

## 2020-07-15 NOTE — Progress Notes (Signed)
PATIENT CARE CENTER NOTE   Diagnosis: Acute blood loss anemia (D62)     Provider: [name], MD     Procedure: IM Sandostatin (Octreotide)      Note: Patient received IM Sandostatin (dose 2 of 6)  injection in the left hip. Tolerated well.  Vital signs stable. AVS offered but patient refused. Patient alert, oriented and ambulatory at discharge.

## 2020-07-16 DIAGNOSIS — Z992 Dependence on renal dialysis: Secondary | ICD-10-CM | POA: Diagnosis not present

## 2020-07-16 DIAGNOSIS — N186 End stage renal disease: Secondary | ICD-10-CM | POA: Diagnosis not present

## 2020-07-16 DIAGNOSIS — N2581 Secondary hyperparathyroidism of renal origin: Secondary | ICD-10-CM | POA: Diagnosis not present

## 2020-07-17 DIAGNOSIS — N186 End stage renal disease: Secondary | ICD-10-CM | POA: Diagnosis not present

## 2020-07-17 DIAGNOSIS — Z992 Dependence on renal dialysis: Secondary | ICD-10-CM | POA: Diagnosis not present

## 2020-07-17 DIAGNOSIS — N032 Chronic nephritic syndrome with diffuse membranous glomerulonephritis: Secondary | ICD-10-CM | POA: Diagnosis not present

## 2020-07-18 ENCOUNTER — Other Ambulatory Visit: Payer: Self-pay | Admitting: Gastroenterology

## 2020-07-18 DIAGNOSIS — N186 End stage renal disease: Secondary | ICD-10-CM | POA: Diagnosis not present

## 2020-07-18 DIAGNOSIS — N2581 Secondary hyperparathyroidism of renal origin: Secondary | ICD-10-CM | POA: Diagnosis not present

## 2020-07-18 DIAGNOSIS — Z992 Dependence on renal dialysis: Secondary | ICD-10-CM | POA: Diagnosis not present

## 2020-07-21 DIAGNOSIS — Z992 Dependence on renal dialysis: Secondary | ICD-10-CM | POA: Diagnosis not present

## 2020-07-21 DIAGNOSIS — N186 End stage renal disease: Secondary | ICD-10-CM | POA: Diagnosis not present

## 2020-07-21 DIAGNOSIS — N2581 Secondary hyperparathyroidism of renal origin: Secondary | ICD-10-CM | POA: Diagnosis not present

## 2020-07-23 DIAGNOSIS — Z992 Dependence on renal dialysis: Secondary | ICD-10-CM | POA: Diagnosis not present

## 2020-07-23 DIAGNOSIS — N2581 Secondary hyperparathyroidism of renal origin: Secondary | ICD-10-CM | POA: Diagnosis not present

## 2020-07-23 DIAGNOSIS — N186 End stage renal disease: Secondary | ICD-10-CM | POA: Diagnosis not present

## 2020-07-25 DIAGNOSIS — N186 End stage renal disease: Secondary | ICD-10-CM | POA: Diagnosis not present

## 2020-07-25 DIAGNOSIS — N2581 Secondary hyperparathyroidism of renal origin: Secondary | ICD-10-CM | POA: Diagnosis not present

## 2020-07-25 DIAGNOSIS — Z992 Dependence on renal dialysis: Secondary | ICD-10-CM | POA: Diagnosis not present

## 2020-07-28 DIAGNOSIS — Z992 Dependence on renal dialysis: Secondary | ICD-10-CM | POA: Diagnosis not present

## 2020-07-28 DIAGNOSIS — N2581 Secondary hyperparathyroidism of renal origin: Secondary | ICD-10-CM | POA: Diagnosis not present

## 2020-07-28 DIAGNOSIS — N186 End stage renal disease: Secondary | ICD-10-CM | POA: Diagnosis not present

## 2020-07-30 DIAGNOSIS — Z992 Dependence on renal dialysis: Secondary | ICD-10-CM | POA: Diagnosis not present

## 2020-07-30 DIAGNOSIS — N186 End stage renal disease: Secondary | ICD-10-CM | POA: Diagnosis not present

## 2020-07-30 DIAGNOSIS — N2581 Secondary hyperparathyroidism of renal origin: Secondary | ICD-10-CM | POA: Diagnosis not present

## 2020-08-01 DIAGNOSIS — N2581 Secondary hyperparathyroidism of renal origin: Secondary | ICD-10-CM | POA: Diagnosis not present

## 2020-08-01 DIAGNOSIS — Z992 Dependence on renal dialysis: Secondary | ICD-10-CM | POA: Diagnosis not present

## 2020-08-01 DIAGNOSIS — N186 End stage renal disease: Secondary | ICD-10-CM | POA: Diagnosis not present

## 2020-08-04 DIAGNOSIS — N2581 Secondary hyperparathyroidism of renal origin: Secondary | ICD-10-CM | POA: Diagnosis not present

## 2020-08-04 DIAGNOSIS — Z992 Dependence on renal dialysis: Secondary | ICD-10-CM | POA: Diagnosis not present

## 2020-08-04 DIAGNOSIS — N186 End stage renal disease: Secondary | ICD-10-CM | POA: Diagnosis not present

## 2020-08-06 DIAGNOSIS — N2581 Secondary hyperparathyroidism of renal origin: Secondary | ICD-10-CM | POA: Diagnosis not present

## 2020-08-06 DIAGNOSIS — Z992 Dependence on renal dialysis: Secondary | ICD-10-CM | POA: Diagnosis not present

## 2020-08-06 DIAGNOSIS — N186 End stage renal disease: Secondary | ICD-10-CM | POA: Diagnosis not present

## 2020-08-08 DIAGNOSIS — Z992 Dependence on renal dialysis: Secondary | ICD-10-CM | POA: Diagnosis not present

## 2020-08-08 DIAGNOSIS — N186 End stage renal disease: Secondary | ICD-10-CM | POA: Diagnosis not present

## 2020-08-08 DIAGNOSIS — N2581 Secondary hyperparathyroidism of renal origin: Secondary | ICD-10-CM | POA: Diagnosis not present

## 2020-08-11 DIAGNOSIS — N186 End stage renal disease: Secondary | ICD-10-CM | POA: Diagnosis not present

## 2020-08-11 DIAGNOSIS — Z992 Dependence on renal dialysis: Secondary | ICD-10-CM | POA: Diagnosis not present

## 2020-08-11 DIAGNOSIS — N2581 Secondary hyperparathyroidism of renal origin: Secondary | ICD-10-CM | POA: Diagnosis not present

## 2020-08-11 IMAGING — XA IR FLUORO GUIDE CV LINE*R*
1 series · 1 of 1 positions shown · non-contrast
Comparison: Image guided placement of a non tunneled temporary
dialysis catheter-10/06/2018

INDICATION: End-stage renal disease. Patient underwent placement of a temporary
hemodialysis catheter on 10/06/2018 for the initiation of dialysis
and returns today for fluoroscopic guided conversion the temporary
dialysis to a tunneled/permanent dialysis catheter for durable
intravenous access.

EXAM:
FLUOROSCOPIC GUIDED CONVERSION OF NON TUNNELED TO TUNNELED CENTRAL
VENOUS HEMODIALYSIS CATHETER

[Series 1: fl (-) angio · 1 of 1 slices shown]
[im 1/1]
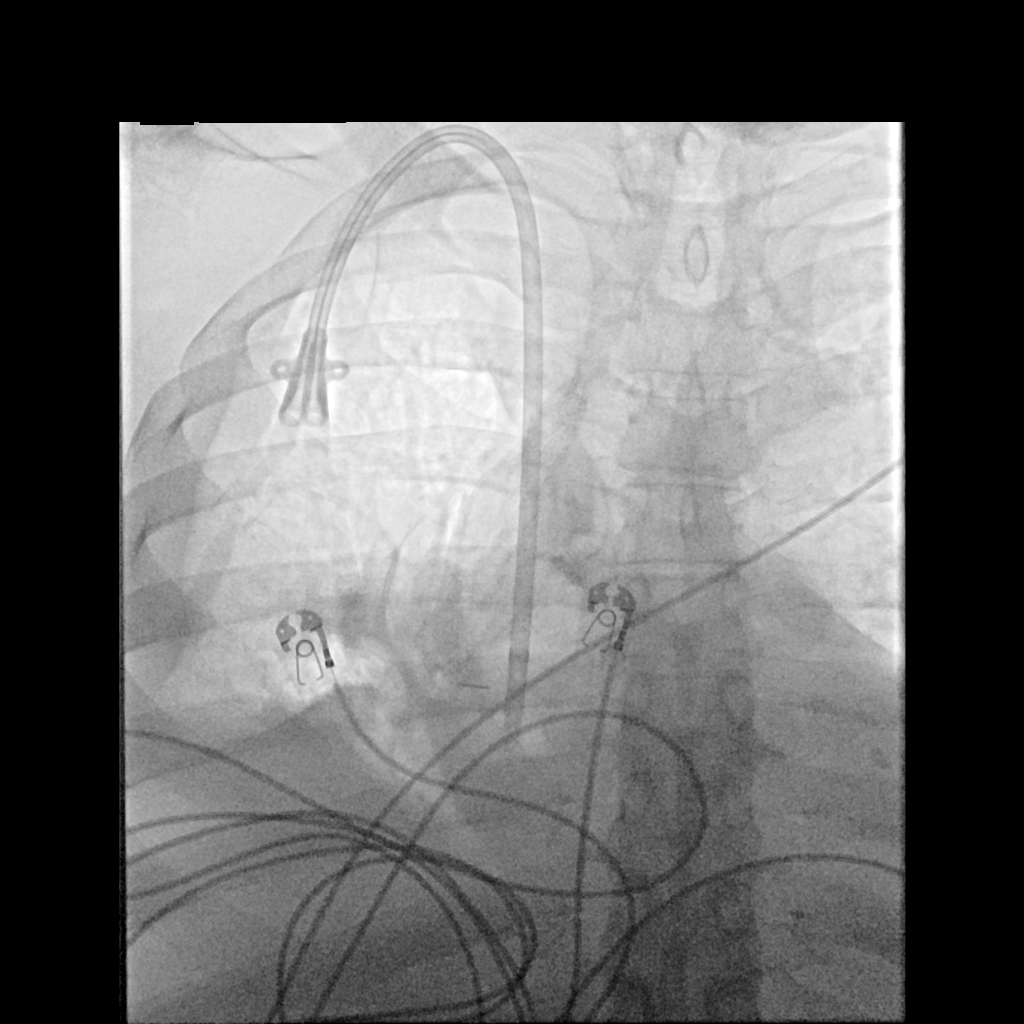

[1 of 1 positions shown; findings below may reference images not displayed]

MEDICATIONS:
None

ANESTHESIA/SEDATION:
Moderate (conscious) sedation was employed during this procedure. A
total of Versed 1 mg and Fentanyl 50 mcg was administered
intravenously.

Moderate Sedation Time: 15 minutes. The patient's level of
consciousness and vital signs were monitored continuously by
radiology nursing throughout the procedure under my direct
supervision.

FLUOROSCOPY TIME:  42 seconds (6 mGy)

COMPLICATIONS:
None immediate.

PROCEDURE:
Informed written consent was obtained from the patient after a
discussion of the risks, benefits, and alternatives to treatment.
Questions regarding the procedure were encouraged and answered. The
skin and external portion of the existing hemodialysis catheter was
prepped with chlorhexidine in a sterile fashion, and a sterile drape
was applied covering the operative field. Maximum barrier sterile
technique with sterile gowns and gloves were used for the procedure.
A timeout was performed prior to the initiation of the procedure.

A lumen of the none tunneled temporary dialysis catheter was
cannulated with a stiff Glidewire in utilized for measurement
purposes. Next, the Glidewire was advanced to the level of the IVC.
A 19 cm tip to cuff palindrome dialysis catheter was tunneled from a
site along the anterior chest to the venotomy site.

Under intermittent fluoroscopic guidance, the temporary dialysis
catheter was exchanged for a peel-away sheath. The tunneled dialysis
catheter was inserted into the peel-away sheath with tips open
terminating within in the superior aspect the right atrium. Final
catheter positioning was confirmed and documented with a spot
fluoroscopic image. The catheter aspirates and flushes normally. The
catheter was flushed with appropriate volume heparin dwells.

The catheter exit site was secured with a 0-Prolene retention
sutures. The venotomy site was apposed with Nadeska Sono.
Both lumens were heparinized. A dressing was placed. The patient
tolerated the procedure well without immediate post procedural
complication.
IMPRESSION: Successful fluoroscopic guided conversion of a temporary to a
permanent 19 cm tip to cuff tunneled hemodialysis catheter with tips
terminating within the right atrium. The catheter is ready for
immediate use.

## 2020-08-12 ENCOUNTER — Other Ambulatory Visit: Payer: Self-pay

## 2020-08-12 ENCOUNTER — Non-Acute Institutional Stay (HOSPITAL_COMMUNITY)
Admission: RE | Admit: 2020-08-12 | Discharge: 2020-08-12 | Disposition: A | Discharge status: Home / Self Care | Payer: Medicare PPO | Source: Ambulatory Visit | Attending: Internal Medicine | Admitting: Internal Medicine

## 2020-08-12 DIAGNOSIS — K5520 Angiodysplasia of colon without hemorrhage: Secondary | ICD-10-CM | POA: Diagnosis not present

## 2020-08-12 MED ORDER — OCTREOTIDE ACETATE 20 MG IM KIT
20.0000 mg | PACK | Freq: Once | INTRAMUSCULAR | Status: AC
  Administered 2020-08-12: 20 mg via INTRAMUSCULAR
  Filled 2020-08-12: qty 1

## 2020-08-12 NOTE — Progress Notes (Signed)
PATIENT CARE CENTER NOTE    Diagnosis: Acute blood loss anemia (D62)     Provider: [name], MD     Procedure: IM Sandostatin (Octreotide)      Note: Patient received IM Sandostatin (dose 3 of 6)  injection in the left hip. Tolerated well.  BP soft but patient asymptomatic and stable.  AVS offered but patient refused. Patient alert, oriented and ambulatory at discharge.

## 2020-08-13 DIAGNOSIS — Z992 Dependence on renal dialysis: Secondary | ICD-10-CM | POA: Diagnosis not present

## 2020-08-13 DIAGNOSIS — N2581 Secondary hyperparathyroidism of renal origin: Secondary | ICD-10-CM | POA: Diagnosis not present

## 2020-08-13 DIAGNOSIS — N186 End stage renal disease: Secondary | ICD-10-CM | POA: Diagnosis not present

## 2020-08-14 DIAGNOSIS — C3492 Malignant neoplasm of unspecified part of left bronchus or lung: Secondary | ICD-10-CM | POA: Diagnosis not present

## 2020-08-14 DIAGNOSIS — R0600 Dyspnea, unspecified: Secondary | ICD-10-CM | POA: Diagnosis not present

## 2020-08-14 DIAGNOSIS — I5033 Acute on chronic diastolic (congestive) heart failure: Secondary | ICD-10-CM | POA: Diagnosis not present

## 2020-08-15 DIAGNOSIS — N2581 Secondary hyperparathyroidism of renal origin: Secondary | ICD-10-CM | POA: Diagnosis not present

## 2020-08-15 DIAGNOSIS — Z992 Dependence on renal dialysis: Secondary | ICD-10-CM | POA: Diagnosis not present

## 2020-08-15 DIAGNOSIS — N186 End stage renal disease: Secondary | ICD-10-CM | POA: Diagnosis not present

## 2020-08-17 DIAGNOSIS — N186 End stage renal disease: Secondary | ICD-10-CM | POA: Diagnosis not present

## 2020-08-17 DIAGNOSIS — Z992 Dependence on renal dialysis: Secondary | ICD-10-CM | POA: Diagnosis not present

## 2020-08-17 DIAGNOSIS — N032 Chronic nephritic syndrome with diffuse membranous glomerulonephritis: Secondary | ICD-10-CM | POA: Diagnosis not present

## 2020-08-18 DIAGNOSIS — N2581 Secondary hyperparathyroidism of renal origin: Secondary | ICD-10-CM | POA: Diagnosis not present

## 2020-08-18 DIAGNOSIS — N186 End stage renal disease: Secondary | ICD-10-CM | POA: Diagnosis not present

## 2020-08-18 DIAGNOSIS — Z992 Dependence on renal dialysis: Secondary | ICD-10-CM | POA: Diagnosis not present

## 2020-08-20 ENCOUNTER — Encounter: Payer: Self-pay | Admitting: Family Medicine

## 2020-08-20 DIAGNOSIS — Z992 Dependence on renal dialysis: Secondary | ICD-10-CM | POA: Diagnosis not present

## 2020-08-20 DIAGNOSIS — N186 End stage renal disease: Secondary | ICD-10-CM | POA: Diagnosis not present

## 2020-08-20 DIAGNOSIS — N2581 Secondary hyperparathyroidism of renal origin: Secondary | ICD-10-CM | POA: Diagnosis not present

## 2020-08-22 DIAGNOSIS — N186 End stage renal disease: Secondary | ICD-10-CM | POA: Diagnosis not present

## 2020-08-22 DIAGNOSIS — Z992 Dependence on renal dialysis: Secondary | ICD-10-CM | POA: Diagnosis not present

## 2020-08-22 DIAGNOSIS — N2581 Secondary hyperparathyroidism of renal origin: Secondary | ICD-10-CM | POA: Diagnosis not present

## 2020-08-25 DIAGNOSIS — N2581 Secondary hyperparathyroidism of renal origin: Secondary | ICD-10-CM | POA: Diagnosis not present

## 2020-08-25 DIAGNOSIS — N186 End stage renal disease: Secondary | ICD-10-CM | POA: Diagnosis not present

## 2020-08-25 DIAGNOSIS — Z992 Dependence on renal dialysis: Secondary | ICD-10-CM | POA: Diagnosis not present

## 2020-08-27 DIAGNOSIS — N2581 Secondary hyperparathyroidism of renal origin: Secondary | ICD-10-CM | POA: Diagnosis not present

## 2020-08-27 DIAGNOSIS — N186 End stage renal disease: Secondary | ICD-10-CM | POA: Diagnosis not present

## 2020-08-27 DIAGNOSIS — Z992 Dependence on renal dialysis: Secondary | ICD-10-CM | POA: Diagnosis not present

## 2020-08-29 DIAGNOSIS — Z992 Dependence on renal dialysis: Secondary | ICD-10-CM | POA: Diagnosis not present

## 2020-08-29 DIAGNOSIS — N2581 Secondary hyperparathyroidism of renal origin: Secondary | ICD-10-CM | POA: Diagnosis not present

## 2020-08-29 DIAGNOSIS — N186 End stage renal disease: Secondary | ICD-10-CM | POA: Diagnosis not present

## 2020-09-01 DIAGNOSIS — N186 End stage renal disease: Secondary | ICD-10-CM | POA: Diagnosis not present

## 2020-09-01 DIAGNOSIS — Z992 Dependence on renal dialysis: Secondary | ICD-10-CM | POA: Diagnosis not present

## 2020-09-01 DIAGNOSIS — N2581 Secondary hyperparathyroidism of renal origin: Secondary | ICD-10-CM | POA: Diagnosis not present

## 2020-09-03 DIAGNOSIS — N186 End stage renal disease: Secondary | ICD-10-CM | POA: Diagnosis not present

## 2020-09-03 DIAGNOSIS — Z992 Dependence on renal dialysis: Secondary | ICD-10-CM | POA: Diagnosis not present

## 2020-09-03 DIAGNOSIS — N2581 Secondary hyperparathyroidism of renal origin: Secondary | ICD-10-CM | POA: Diagnosis not present

## 2020-09-05 DIAGNOSIS — N2581 Secondary hyperparathyroidism of renal origin: Secondary | ICD-10-CM | POA: Diagnosis not present

## 2020-09-05 DIAGNOSIS — Z992 Dependence on renal dialysis: Secondary | ICD-10-CM | POA: Diagnosis not present

## 2020-09-05 DIAGNOSIS — N186 End stage renal disease: Secondary | ICD-10-CM | POA: Diagnosis not present

## 2020-09-08 DIAGNOSIS — Z992 Dependence on renal dialysis: Secondary | ICD-10-CM | POA: Diagnosis not present

## 2020-09-08 DIAGNOSIS — N186 End stage renal disease: Secondary | ICD-10-CM | POA: Diagnosis not present

## 2020-09-08 DIAGNOSIS — N2581 Secondary hyperparathyroidism of renal origin: Secondary | ICD-10-CM | POA: Diagnosis not present

## 2020-09-09 ENCOUNTER — Non-Acute Institutional Stay (HOSPITAL_COMMUNITY)
Admission: RE | Admit: 2020-09-09 | Discharge: 2020-09-09 | Disposition: A | Discharge status: Home / Self Care | Payer: Medicare PPO | Source: Ambulatory Visit | Attending: Internal Medicine | Admitting: Internal Medicine

## 2020-09-09 ENCOUNTER — Other Ambulatory Visit: Payer: Self-pay

## 2020-09-09 DIAGNOSIS — D62 Acute posthemorrhagic anemia: Secondary | ICD-10-CM | POA: Diagnosis not present

## 2020-09-09 MED ORDER — OCTREOTIDE ACETATE 20 MG IM KIT
20.0000 mg | PACK | Freq: Once | INTRAMUSCULAR | Status: AC
  Administered 2020-09-09: 20 mg via INTRAMUSCULAR
  Filled 2020-09-09: qty 1

## 2020-09-09 NOTE — Progress Notes (Signed)
PATIENT CARE CENTER NOTE     Diagnosis: Acute blood loss anemia (D62)     Provider: [name], MD     Procedure: IM Sandostatin (Octreotide)      Note: Patient received IM Sandostatin (dose 4 of 6)  injection in the left hip. Tolerated well. Vital signs stable.  AVS offered but patient refused. Patient alert, oriented and ambulatory at discharge.

## 2020-09-10 DIAGNOSIS — Z992 Dependence on renal dialysis: Secondary | ICD-10-CM | POA: Diagnosis not present

## 2020-09-10 DIAGNOSIS — N186 End stage renal disease: Secondary | ICD-10-CM | POA: Diagnosis not present

## 2020-09-10 DIAGNOSIS — N2581 Secondary hyperparathyroidism of renal origin: Secondary | ICD-10-CM | POA: Diagnosis not present

## 2020-09-14 DIAGNOSIS — I5033 Acute on chronic diastolic (congestive) heart failure: Secondary | ICD-10-CM | POA: Diagnosis not present

## 2020-09-14 DIAGNOSIS — R0600 Dyspnea, unspecified: Secondary | ICD-10-CM | POA: Diagnosis not present

## 2020-09-14 DIAGNOSIS — C3492 Malignant neoplasm of unspecified part of left bronchus or lung: Secondary | ICD-10-CM | POA: Diagnosis not present

## 2020-09-15 DIAGNOSIS — N2581 Secondary hyperparathyroidism of renal origin: Secondary | ICD-10-CM | POA: Diagnosis not present

## 2020-09-15 DIAGNOSIS — N186 End stage renal disease: Secondary | ICD-10-CM | POA: Diagnosis not present

## 2020-09-15 DIAGNOSIS — Z992 Dependence on renal dialysis: Secondary | ICD-10-CM | POA: Diagnosis not present

## 2020-09-17 DIAGNOSIS — N186 End stage renal disease: Secondary | ICD-10-CM | POA: Diagnosis not present

## 2020-09-17 DIAGNOSIS — Z992 Dependence on renal dialysis: Secondary | ICD-10-CM | POA: Diagnosis not present

## 2020-09-17 DIAGNOSIS — N2581 Secondary hyperparathyroidism of renal origin: Secondary | ICD-10-CM | POA: Diagnosis not present

## 2020-09-17 DIAGNOSIS — N032 Chronic nephritic syndrome with diffuse membranous glomerulonephritis: Secondary | ICD-10-CM | POA: Diagnosis not present

## 2020-09-19 DIAGNOSIS — N186 End stage renal disease: Secondary | ICD-10-CM | POA: Diagnosis not present

## 2020-09-19 DIAGNOSIS — N2581 Secondary hyperparathyroidism of renal origin: Secondary | ICD-10-CM | POA: Diagnosis not present

## 2020-09-19 DIAGNOSIS — Z992 Dependence on renal dialysis: Secondary | ICD-10-CM | POA: Diagnosis not present

## 2020-09-22 DIAGNOSIS — Z992 Dependence on renal dialysis: Secondary | ICD-10-CM | POA: Diagnosis not present

## 2020-09-22 DIAGNOSIS — N186 End stage renal disease: Secondary | ICD-10-CM | POA: Diagnosis not present

## 2020-09-22 DIAGNOSIS — N2581 Secondary hyperparathyroidism of renal origin: Secondary | ICD-10-CM | POA: Diagnosis not present

## 2020-09-24 DIAGNOSIS — N2581 Secondary hyperparathyroidism of renal origin: Secondary | ICD-10-CM | POA: Diagnosis not present

## 2020-09-24 DIAGNOSIS — Z992 Dependence on renal dialysis: Secondary | ICD-10-CM | POA: Diagnosis not present

## 2020-09-24 DIAGNOSIS — N186 End stage renal disease: Secondary | ICD-10-CM | POA: Diagnosis not present

## 2020-09-26 DIAGNOSIS — Z992 Dependence on renal dialysis: Secondary | ICD-10-CM | POA: Diagnosis not present

## 2020-09-26 DIAGNOSIS — N2581 Secondary hyperparathyroidism of renal origin: Secondary | ICD-10-CM | POA: Diagnosis not present

## 2020-09-26 DIAGNOSIS — N186 End stage renal disease: Secondary | ICD-10-CM | POA: Diagnosis not present

## 2020-09-29 DIAGNOSIS — N2581 Secondary hyperparathyroidism of renal origin: Secondary | ICD-10-CM | POA: Diagnosis not present

## 2020-09-29 DIAGNOSIS — Z992 Dependence on renal dialysis: Secondary | ICD-10-CM | POA: Diagnosis not present

## 2020-09-29 DIAGNOSIS — N186 End stage renal disease: Secondary | ICD-10-CM | POA: Diagnosis not present

## 2020-10-01 DIAGNOSIS — Z992 Dependence on renal dialysis: Secondary | ICD-10-CM | POA: Diagnosis not present

## 2020-10-01 DIAGNOSIS — N186 End stage renal disease: Secondary | ICD-10-CM | POA: Diagnosis not present

## 2020-10-01 DIAGNOSIS — N2581 Secondary hyperparathyroidism of renal origin: Secondary | ICD-10-CM | POA: Diagnosis not present

## 2020-10-03 DIAGNOSIS — N2581 Secondary hyperparathyroidism of renal origin: Secondary | ICD-10-CM | POA: Diagnosis not present

## 2020-10-03 DIAGNOSIS — Z992 Dependence on renal dialysis: Secondary | ICD-10-CM | POA: Diagnosis not present

## 2020-10-03 DIAGNOSIS — N186 End stage renal disease: Secondary | ICD-10-CM | POA: Diagnosis not present

## 2020-10-06 DIAGNOSIS — N2581 Secondary hyperparathyroidism of renal origin: Secondary | ICD-10-CM | POA: Diagnosis not present

## 2020-10-06 DIAGNOSIS — Z992 Dependence on renal dialysis: Secondary | ICD-10-CM | POA: Diagnosis not present

## 2020-10-06 DIAGNOSIS — N186 End stage renal disease: Secondary | ICD-10-CM | POA: Diagnosis not present

## 2020-10-08 DIAGNOSIS — Z992 Dependence on renal dialysis: Secondary | ICD-10-CM | POA: Diagnosis not present

## 2020-10-08 DIAGNOSIS — N2581 Secondary hyperparathyroidism of renal origin: Secondary | ICD-10-CM | POA: Diagnosis not present

## 2020-10-08 DIAGNOSIS — N186 End stage renal disease: Secondary | ICD-10-CM | POA: Diagnosis not present

## 2020-10-09 ENCOUNTER — Non-Acute Institutional Stay (HOSPITAL_COMMUNITY)
Admission: RE | Admit: 2020-10-09 | Discharge: 2020-10-09 | Disposition: A | Discharge status: Home / Self Care | Payer: Medicare PPO | Source: Ambulatory Visit | Attending: Internal Medicine | Admitting: Internal Medicine

## 2020-10-09 ENCOUNTER — Telehealth: Payer: Self-pay | Admitting: Family Medicine

## 2020-10-09 ENCOUNTER — Other Ambulatory Visit: Payer: Self-pay

## 2020-10-09 DIAGNOSIS — K5520 Angiodysplasia of colon without hemorrhage: Secondary | ICD-10-CM | POA: Diagnosis not present

## 2020-10-09 MED ORDER — OCTREOTIDE ACETATE 20 MG IM KIT
20.0000 mg | PACK | Freq: Once | INTRAMUSCULAR | Status: AC
  Administered 2020-10-09: 20 mg via INTRAMUSCULAR
  Filled 2020-10-09: qty 1

## 2020-10-09 NOTE — Progress Notes (Signed)
PATIENT CARE CENTER NOTE     Diagnosis: Acute blood loss anemia (D62)     Provider: [name], MD     Procedure: IM Sandostatin (Octreotide)      Note: Patient received IM Sandostatin (dose 5 of 6)  injection in the left hip. Tolerated well. Vital signs stable.  AVS offered but patient refused. Patient alert, oriented and ambulatory at discharge.

## 2020-10-09 NOTE — Telephone Encounter (Signed)
  No answer unable to leave a  message for patient to call back and schedule Medicare Annual Wellness Visit (AWV) in office.   If unable to come into the office for AWV,  please offer to do virtually or by telephone.  No hx of AWV eligible for AWVI as of 05/06/2015  Please schedule at anytime with [facility]Nurse Health Advisor.      40 Minutes appointment   Any questions, please call me at [phone]

## 2020-10-10 DIAGNOSIS — N2581 Secondary hyperparathyroidism of renal origin: Secondary | ICD-10-CM | POA: Diagnosis not present

## 2020-10-10 DIAGNOSIS — N186 End stage renal disease: Secondary | ICD-10-CM | POA: Diagnosis not present

## 2020-10-10 DIAGNOSIS — Z992 Dependence on renal dialysis: Secondary | ICD-10-CM | POA: Diagnosis not present

## 2020-10-13 DIAGNOSIS — N2581 Secondary hyperparathyroidism of renal origin: Secondary | ICD-10-CM | POA: Diagnosis not present

## 2020-10-13 DIAGNOSIS — N186 End stage renal disease: Secondary | ICD-10-CM | POA: Diagnosis not present

## 2020-10-13 DIAGNOSIS — Z992 Dependence on renal dialysis: Secondary | ICD-10-CM | POA: Diagnosis not present

## 2020-10-15 DIAGNOSIS — Z992 Dependence on renal dialysis: Secondary | ICD-10-CM | POA: Diagnosis not present

## 2020-10-15 DIAGNOSIS — N186 End stage renal disease: Secondary | ICD-10-CM | POA: Diagnosis not present

## 2020-10-15 DIAGNOSIS — I5033 Acute on chronic diastolic (congestive) heart failure: Secondary | ICD-10-CM | POA: Diagnosis not present

## 2020-10-15 DIAGNOSIS — R0600 Dyspnea, unspecified: Secondary | ICD-10-CM | POA: Diagnosis not present

## 2020-10-15 DIAGNOSIS — C3492 Malignant neoplasm of unspecified part of left bronchus or lung: Secondary | ICD-10-CM | POA: Diagnosis not present

## 2020-10-15 DIAGNOSIS — N2581 Secondary hyperparathyroidism of renal origin: Secondary | ICD-10-CM | POA: Diagnosis not present

## 2020-10-17 DIAGNOSIS — Z992 Dependence on renal dialysis: Secondary | ICD-10-CM | POA: Diagnosis not present

## 2020-10-17 DIAGNOSIS — N032 Chronic nephritic syndrome with diffuse membranous glomerulonephritis: Secondary | ICD-10-CM | POA: Diagnosis not present

## 2020-10-17 DIAGNOSIS — N2581 Secondary hyperparathyroidism of renal origin: Secondary | ICD-10-CM | POA: Diagnosis not present

## 2020-10-17 DIAGNOSIS — N186 End stage renal disease: Secondary | ICD-10-CM | POA: Diagnosis not present

## 2020-10-20 DIAGNOSIS — N2581 Secondary hyperparathyroidism of renal origin: Secondary | ICD-10-CM | POA: Diagnosis not present

## 2020-10-20 DIAGNOSIS — N186 End stage renal disease: Secondary | ICD-10-CM | POA: Diagnosis not present

## 2020-10-20 DIAGNOSIS — Z992 Dependence on renal dialysis: Secondary | ICD-10-CM | POA: Diagnosis not present

## 2020-10-22 DIAGNOSIS — N186 End stage renal disease: Secondary | ICD-10-CM | POA: Diagnosis not present

## 2020-10-22 DIAGNOSIS — Z992 Dependence on renal dialysis: Secondary | ICD-10-CM | POA: Diagnosis not present

## 2020-10-22 DIAGNOSIS — N2581 Secondary hyperparathyroidism of renal origin: Secondary | ICD-10-CM | POA: Diagnosis not present

## 2020-10-24 DIAGNOSIS — Z992 Dependence on renal dialysis: Secondary | ICD-10-CM | POA: Diagnosis not present

## 2020-10-24 DIAGNOSIS — N186 End stage renal disease: Secondary | ICD-10-CM | POA: Diagnosis not present

## 2020-10-24 DIAGNOSIS — N2581 Secondary hyperparathyroidism of renal origin: Secondary | ICD-10-CM | POA: Diagnosis not present

## 2020-10-27 DIAGNOSIS — N2581 Secondary hyperparathyroidism of renal origin: Secondary | ICD-10-CM | POA: Diagnosis not present

## 2020-10-27 DIAGNOSIS — N186 End stage renal disease: Secondary | ICD-10-CM | POA: Diagnosis not present

## 2020-10-27 DIAGNOSIS — Z992 Dependence on renal dialysis: Secondary | ICD-10-CM | POA: Diagnosis not present

## 2020-10-29 DIAGNOSIS — Z992 Dependence on renal dialysis: Secondary | ICD-10-CM | POA: Diagnosis not present

## 2020-10-29 DIAGNOSIS — N2581 Secondary hyperparathyroidism of renal origin: Secondary | ICD-10-CM | POA: Diagnosis not present

## 2020-10-29 DIAGNOSIS — N186 End stage renal disease: Secondary | ICD-10-CM | POA: Diagnosis not present

## 2020-10-31 DIAGNOSIS — N186 End stage renal disease: Secondary | ICD-10-CM | POA: Diagnosis not present

## 2020-10-31 DIAGNOSIS — N2581 Secondary hyperparathyroidism of renal origin: Secondary | ICD-10-CM | POA: Diagnosis not present

## 2020-10-31 DIAGNOSIS — Z992 Dependence on renal dialysis: Secondary | ICD-10-CM | POA: Diagnosis not present

## 2020-11-03 DIAGNOSIS — Z992 Dependence on renal dialysis: Secondary | ICD-10-CM | POA: Diagnosis not present

## 2020-11-03 DIAGNOSIS — N186 End stage renal disease: Secondary | ICD-10-CM | POA: Diagnosis not present

## 2020-11-03 DIAGNOSIS — N2581 Secondary hyperparathyroidism of renal origin: Secondary | ICD-10-CM | POA: Diagnosis not present

## 2020-11-05 DIAGNOSIS — N2581 Secondary hyperparathyroidism of renal origin: Secondary | ICD-10-CM | POA: Diagnosis not present

## 2020-11-05 DIAGNOSIS — N186 End stage renal disease: Secondary | ICD-10-CM | POA: Diagnosis not present

## 2020-11-05 DIAGNOSIS — Z992 Dependence on renal dialysis: Secondary | ICD-10-CM | POA: Diagnosis not present

## 2020-11-06 ENCOUNTER — Non-Acute Institutional Stay (HOSPITAL_COMMUNITY)
Admission: RE | Admit: 2020-11-06 | Discharge: 2020-11-06 | Disposition: A | Discharge status: Home / Self Care | Payer: Medicare PPO | Source: Ambulatory Visit | Attending: Internal Medicine | Admitting: Internal Medicine

## 2020-11-06 ENCOUNTER — Other Ambulatory Visit: Payer: Self-pay

## 2020-11-06 ENCOUNTER — Telehealth: Payer: Self-pay

## 2020-11-06 DIAGNOSIS — K5520 Angiodysplasia of colon without hemorrhage: Secondary | ICD-10-CM | POA: Diagnosis not present

## 2020-11-06 DIAGNOSIS — D500 Iron deficiency anemia secondary to blood loss (chronic): Secondary | ICD-10-CM

## 2020-11-06 MED ORDER — OCTREOTIDE ACETATE 20 MG IM KIT
20.0000 mg | PACK | Freq: Once | INTRAMUSCULAR | Status: AC
  Administered 2020-11-06: 20 mg via INTRAMUSCULAR
  Filled 2020-11-06: qty 1

## 2020-11-06 NOTE — Telephone Encounter (Signed)
Received request from Shiela Mayer, RN requesting refill of Sandostatin 20mg  qmo. However, I am unable to verify this dosage for accuracy. Dr. Tarri Glenn agreed to refill for 6 months but [facility] does not reflect appropriate method for writing Rx's. Unfortunately, I cannot send refill until medication dosage, frequency has been verified by Dr. Tarri Glenn, rather than as documented by RN. Routing this message to Dr. Tarri Glenn for her to confirm dosage and frequency. Refill remains on hold until orders clarified.  LOV 11/22/19. According to Dr. Tarri Glenn, pt will also require next available f/u appt with either [name], PA or with herself. In addition, pt requires lab work (CBC, Iron and Ferritin). Called pt to schedule f/u appt and to ensure he completes labs. Pt declined to schedule nor would complete labs as advised because he states he is required to attend dialysis 3 days/week. Further added he will need to verify our request with dialysis before proceeding with Dr. Tarri Glenn orders. Education was provided re: importance for f/u and f/u labs.

## 2020-11-06 NOTE — Telephone Encounter (Signed)
Shiela Mayer, RN has been made aware that a refill cannot be authorized at this time. Pt is aware of Dr. Tarri Glenn request. We will await pt response re: how he wishes to proceed.

## 2020-11-06 NOTE — Progress Notes (Signed)
Contacted [facility] through secure chat to request a renewed Sandostatin order for patient.  Spoke with Ammie, LNP and Dr. [name]. Per Dr. Tarri Glenn, patient will need to schedule a follow-up appointment with GI office before order is renewed. Patient's next Sandostatin appointment is scheduled for 12/04/20. Will contact patient to cancel/reschedule this appointment if order not available.

## 2020-11-06 NOTE — Progress Notes (Signed)
PATIENT CARE CENTER NOTE     Diagnosis: Acute blood loss anemia (D62)     Provider: [name], MD     Procedure: IM Sandostatin (Octreotide)      Note: Patient received IM Sandostatin (dose 6 of 6)  injection in the left hip. Tolerated well. Vital signs stable.  AVS offered but patient refused. Patient alert, oriented and ambulatory at discharge.

## 2020-11-06 NOTE — Telephone Encounter (Signed)
Thank you :)

## 2020-11-07 DIAGNOSIS — N2581 Secondary hyperparathyroidism of renal origin: Secondary | ICD-10-CM | POA: Diagnosis not present

## 2020-11-07 DIAGNOSIS — Z992 Dependence on renal dialysis: Secondary | ICD-10-CM | POA: Diagnosis not present

## 2020-11-07 DIAGNOSIS — N186 End stage renal disease: Secondary | ICD-10-CM | POA: Diagnosis not present

## 2020-11-10 DIAGNOSIS — N186 End stage renal disease: Secondary | ICD-10-CM | POA: Diagnosis not present

## 2020-11-10 DIAGNOSIS — N2581 Secondary hyperparathyroidism of renal origin: Secondary | ICD-10-CM | POA: Diagnosis not present

## 2020-11-10 DIAGNOSIS — Z992 Dependence on renal dialysis: Secondary | ICD-10-CM | POA: Diagnosis not present

## 2020-11-12 DIAGNOSIS — Z992 Dependence on renal dialysis: Secondary | ICD-10-CM | POA: Diagnosis not present

## 2020-11-12 DIAGNOSIS — N2581 Secondary hyperparathyroidism of renal origin: Secondary | ICD-10-CM | POA: Diagnosis not present

## 2020-11-12 DIAGNOSIS — N186 End stage renal disease: Secondary | ICD-10-CM | POA: Diagnosis not present

## 2020-11-14 DIAGNOSIS — I5033 Acute on chronic diastolic (congestive) heart failure: Secondary | ICD-10-CM | POA: Diagnosis not present

## 2020-11-14 DIAGNOSIS — R0600 Dyspnea, unspecified: Secondary | ICD-10-CM | POA: Diagnosis not present

## 2020-11-14 DIAGNOSIS — N2581 Secondary hyperparathyroidism of renal origin: Secondary | ICD-10-CM | POA: Diagnosis not present

## 2020-11-14 DIAGNOSIS — Z992 Dependence on renal dialysis: Secondary | ICD-10-CM | POA: Diagnosis not present

## 2020-11-14 DIAGNOSIS — N186 End stage renal disease: Secondary | ICD-10-CM | POA: Diagnosis not present

## 2020-11-14 DIAGNOSIS — C3492 Malignant neoplasm of unspecified part of left bronchus or lung: Secondary | ICD-10-CM | POA: Diagnosis not present

## 2020-11-17 DIAGNOSIS — Z992 Dependence on renal dialysis: Secondary | ICD-10-CM | POA: Diagnosis not present

## 2020-11-17 DIAGNOSIS — N032 Chronic nephritic syndrome with diffuse membranous glomerulonephritis: Secondary | ICD-10-CM | POA: Diagnosis not present

## 2020-11-17 DIAGNOSIS — N186 End stage renal disease: Secondary | ICD-10-CM | POA: Diagnosis not present

## 2020-11-17 DIAGNOSIS — N2581 Secondary hyperparathyroidism of renal origin: Secondary | ICD-10-CM | POA: Diagnosis not present

## 2020-11-18 ENCOUNTER — Ambulatory Visit: Payer: Medicare PPO | Admitting: Podiatry

## 2020-11-19 DIAGNOSIS — N2581 Secondary hyperparathyroidism of renal origin: Secondary | ICD-10-CM | POA: Diagnosis not present

## 2020-11-19 DIAGNOSIS — N186 End stage renal disease: Secondary | ICD-10-CM | POA: Diagnosis not present

## 2020-11-19 DIAGNOSIS — Z992 Dependence on renal dialysis: Secondary | ICD-10-CM | POA: Diagnosis not present

## 2020-11-21 DIAGNOSIS — Z992 Dependence on renal dialysis: Secondary | ICD-10-CM | POA: Diagnosis not present

## 2020-11-21 DIAGNOSIS — N2581 Secondary hyperparathyroidism of renal origin: Secondary | ICD-10-CM | POA: Diagnosis not present

## 2020-11-21 DIAGNOSIS — N186 End stage renal disease: Secondary | ICD-10-CM | POA: Diagnosis not present

## 2020-11-24 DIAGNOSIS — N186 End stage renal disease: Secondary | ICD-10-CM | POA: Diagnosis not present

## 2020-11-24 DIAGNOSIS — Z992 Dependence on renal dialysis: Secondary | ICD-10-CM | POA: Diagnosis not present

## 2020-11-24 DIAGNOSIS — N2581 Secondary hyperparathyroidism of renal origin: Secondary | ICD-10-CM | POA: Diagnosis not present

## 2020-11-26 DIAGNOSIS — N186 End stage renal disease: Secondary | ICD-10-CM | POA: Diagnosis not present

## 2020-11-26 DIAGNOSIS — N2581 Secondary hyperparathyroidism of renal origin: Secondary | ICD-10-CM | POA: Diagnosis not present

## 2020-11-26 DIAGNOSIS — Z992 Dependence on renal dialysis: Secondary | ICD-10-CM | POA: Diagnosis not present

## 2020-11-27 ENCOUNTER — Ambulatory Visit (INDEPENDENT_AMBULATORY_CARE_PROVIDER_SITE_OTHER): Payer: Medicare PPO | Admitting: Gastroenterology

## 2020-11-27 ENCOUNTER — Encounter: Payer: Self-pay | Admitting: Gastroenterology

## 2020-11-27 VITALS — BP 118/80 | HR 80 | Ht 69.0 in | Wt 153.0 lb

## 2020-11-27 DIAGNOSIS — D500 Iron deficiency anemia secondary to blood loss (chronic): Secondary | ICD-10-CM | POA: Diagnosis not present

## 2020-11-27 DIAGNOSIS — K5520 Angiodysplasia of colon without hemorrhage: Secondary | ICD-10-CM | POA: Diagnosis not present

## 2020-11-27 MED ORDER — SANDOSTATIN LAR DEPOT 20 MG IM KIT: 20.0000 mg | PACK | INTRAMUSCULAR | 11 refills | Status: DC

## 2020-11-27 NOTE — Progress Notes (Signed)
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    - 7 duodenal and proximal jejunal AVMs treated with APC 06/22/18    - ooxing AVM distal to the duodenal bulb treated with APC 11/12/18    - capsule endoscopy 11/12/18 showed 3 small non-bleeding AVMs in the small bowel    - started Sandostatin LAR for recurrent blood loss anemia 11/2018    - no further hospitalizations since starting Sandostatin Iron deficiency anemia without recent labs History of PUD: Gastric and duodenal ulcers    - initially diagnosed as source of bleeding 10/2017     - H pylori + 10/2017       - treated with bismuth, metronidazole, doxycycline, PPI       - subsequent gastric biopsies 01/10/18 negative for H pylori     - no PUD seen on EGD 06/22/18 Intestinal metaplasia    - intestinal metaplasia on biopsies 10/10 and after H pylori irradication       - patient not at high risk for gastric cancer, no family history of stomach cancer       - no surveillance indicated based on AGA guidelines LA class A reflux esophagitis on EGD 01/10/18 Duodenal nodule seen on EGD 10/2017 ([place])    - Biopsy 10/2017 showed benign mucosa    - No duodenal or pancreatic abnormalities identified on MRI/MRCP planned    - EUS with Dr. 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  No recent labs to monitor status of iron deficiency anemia.    AddendumMagda Guerra obtained the labs from the dialysis center. Iron 63, ferritin 1376, transferrin saturation 30%, hemoglobin 11.9, hematocrit 37.3.   PLAN: - Continue monthly Sandostatin 20mg  monthly injection - Hemoglobin, iron and ferritin every 3 months (will see if we can arrange to have the labs done at dialysis) - Obtain recent labs from dialysis - Follow-up in 6-9 months, earlier as needed  Please see the "Patient Instructions" section for addition details about the plan.  I spent 30 minutes, including in depth chart review and trying to locate recent labs, independent review of results, communicating results with the patient directly, face-to-face time with the patient, coordinating care, and ordering studies and medications as appropriate, and documentation.   HPI: Andrew Guerra is a 80 y.o. male with ESRD on HD, moderate AS due to bicuspid valve, COPD and history of lung cancer on O2.  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  Had 7 duodenal and proximal jejunal AVMs treated with APC on June 22, 2018.  Dropped his hemoglobin again in October so was sent to the hospital for transfusion and underwent small bowel enteroscopy with Dr. Ardis Hughs 11/12/18 that showed ooxing AVM distal to the duodenal bulb and medium sized hiatal hernia with no Cameron's erosions. Capsule endoscopy 11/12/18 showed 3 small non-bleeding AVMs in the small bowel.  He had recurrent anemia and  melena and was started on Sandostatin.  He was last seen 11/22/19.  His wife accompanies him to this appointment and provides support in the history.  Continues on dialysis M/W/F. He has had no further symptomatic anemia on long-acting octreotide monthly.  No melena. Stools are brown.   No recent labs available to me at this time. But, he reports having regular labs checked at dialysis. His wife feels that his hemoglobin has been at least 10 if not 11.   He feels great. Has more energy. Is trying to help  out around the house.   Past Medical History:  Diagnosis Date   Anemia    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 02/08/2017   moderate by echo 10/2017 with mean AVG 56mmHg and dimensionless index 0.31 consistent with moderate AS.   Cataract    right eye - surgery to remove   CHF (congestive heart failure) ([place])    Elevated PSA 11/29/2016   Patient is followed [facility].  Most recent PSA near 11.  He has had atypia on biopsy.  November 2008   Essential hypertension, benign 06/08/2012   Full dentures    GERD (gastroesophageal reflux disease)    GI bleed    Glaucoma    Heart murmur    since childhood, never has caused any problems   History of rheumatic fever 11/04/2015   Hyperlipidemia 04/25/2013   Hypertension    Malignant neoplasm of left lung ([place]) 02/08/2017   RBBB 07/12/2017   Sickle cell trait ([place])    no problems per patient    Past Surgical History:  Procedure Laterality Date   AV FISTULA PLACEMENT Right 01/25/2019   Procedure: ARTERIOVENOUS (AV) FISTULA CREATION RIGHT ARM;  Surgeon: Rosetta Posner, MD;  Location: [facility] OR;  Service: Vascular;  Laterality: Right;   BIOPSY  11/11/2017   Procedure: BIOPSY;  Surgeon: Lavena Bullion, DO;  Location: [facility]COPY;  Service: Gastroenterology;;   BIOPSY  01/10/2018   Procedure: BIOPSY;  Surgeon: [name], MD;  Location: [facility];  Service: Gastroenterology;;   BIOPSY  06/22/2018   Procedure: BIOPSY;  Surgeon: Yetta Flock, MD;  Location: [facility] ENDOSCOPY;  Service: Gastroenterology;;   CATARACT EXTRACTION Right    cataract eye surgery Right    COLONOSCOPY  11/2009   hx polyps/[place]   COLONOSCOPY N/A 06/22/2018   Procedure: COLONOSCOPY;  Surgeon: Yetta Flock, MD;  Location: [facility];  Service: Gastroenterology;  Laterality: N/A;   ENTEROSCOPY N/A 06/22/2018   Procedure: PUSH ENTEROSCOPY;  Surgeon: Yetta Flock, MD;  Location: [facility] ENDOSCOPY;  Service: Gastroenterology;  Laterality: N/A;    ENTEROSCOPY N/A 11/12/2018   Procedure: ENTEROSCOPY;  Surgeon: Milus Banister, MD;  Location: [facility] ENDOSCOPY;  Service: Endoscopy;  Laterality: N/A;   ESOPHAGOGASTRODUODENOSCOPY N/A 11/11/2017   Procedure: ESOPHAGOGASTRODUODENOSCOPY (EGD);  Surgeon: Lavena Bullion, DO;  Location: [facility] ENDOSCOPY;  Service: Gastroenterology;  Laterality: N/A;   ESOPHAGOGASTRODUODENOSCOPY (EGD) WITH PROPOFOL N/A 01/10/2018   Procedure: ESOPHAGOGASTRODUODENOSCOPY (EGD) WITH PROPOFOL;  Surgeon: [name], MD;  Location: [facility];  Service: Gastroenterology;  Laterality: N/A;   GIVENS CAPSULE STUDY N/A 11/12/2018   Procedure: GIVENS CAPSULE STUDY;  Surgeon: Milus Banister, MD;  Location: [facility] ENDOSCOPY;  Service: Endoscopy;  Laterality: N/A;   HEMOSTASIS CLIP PLACEMENT  11/12/2018   Procedure: HEMOSTASIS CLIP PLACEMENT;  Surgeon: Milus Banister, MD;  Location: [facility] ENDOSCOPY;  Service: Endoscopy;;   HOT HEMOSTASIS N/A 06/22/2018   Procedure: HOT HEMOSTASIS (ARGON PLASMA  COAGULATION/BICAP);  Surgeon: Yetta Flock, MD;  [facility];  Service: Gastroenterology;  Laterality: N/A;   HOT HEMOSTASIS N/A 11/12/2018   Procedure: HOT HEMOSTASIS (ARGON PLASMA COAGULATION/BICAP);  Surgeon: Milus Banister, MD;  Location: [facility] ENDOSCOPY;  Service: Endoscopy;  Laterality: N/A;   IR FLUORO GUIDE CV LINE LEFT  03/15/2019   IR FLUORO GUIDE CV LINE RIGHT  01/16/2018   IR FLUORO GUIDE CV LINE RIGHT  01/31/2018   IR FLUORO GUIDE CV LINE RIGHT  10/06/2018   IR FLUORO GUIDE CV LINE RIGHT  10/11/2018   IR REMOVAL TUN CV CATH W/O FL  05/10/2019   IR US GUIDE VASC ACCESS RIGHT  01/16/2018   IR US GUIDE VASC ACCESS RIGHT  01/31/2018   IR US GUIDE VASC ACCESS RIGHT  10/06/2018   POLYPECTOMY  06/22/2018   Procedure: POLYPECTOMY;  Surgeon: Yetta Flock, MD;  Location: [facility];  Service: Gastroenterology;;   PROSTATE BIOPSY     right eye surgery     to lower eye pressure   RIGHT HEART CATH N/A 01/19/2018    Procedure: RIGHT HEART CATH;  Surgeon: Nelva Bush, MD;  Location: [facility] CV LAB;  Service: Cardiovascular;  Laterality: N/A;   TONSILLECTOMY     VIDEO ASSISTED THORACOSCOPY (VATS)/ LOBECTOMY Right 12/26/2015   wisdom tteeth ext      Current Outpatient Medications  Medication Sig Dispense Refill   carvedilol (COREG) 3.125 MG tablet Take 1 tablet (3.125 mg total) by mouth 2 (two) times daily with a meal. 60 tablet 0   ferric citrate (AURYXIA) 1 GM 210 MG(Fe) tablet Take 210 mg by mouth 5 (five) times daily. Take 1 tablet (210 mg) by mouth with meals and snacks     multivitamin (RENA-VIT) TABS tablet Take 1 tablet by mouth daily.     Nutritional Supplements (FEEDING SUPPLEMENT, NEPRO CARB STEADY,) LIQD Take 237 mLs by mouth 3 (three) times daily.      Octreotide Acetate (SANDOSTATIN IJ) Inject 1 Dose as directed every 30 (thirty) days.      pantoprazole (PROTONIX) 40 MG tablet TAKE 1 TABLET BY MOUTH TWICE A DAY 180 tablet 1   sevelamer carbonate (RENVELA) 800 MG tablet Take 800 mg by mouth 5 (five) times daily. Take 1 tablet (800 mg) by mouth with meals and snacks     No current facility-administered medications for this visit.    Allergies as of 11/27/2020 - Review Complete 11/27/2020  Allergen Reaction Noted   Ambien [zolpidem tartrate] Other (See Comments) 01/31/2018   Lipitor [atorvastatin] Other (See Comments) 07/31/2017    Family History  Problem Relation Age of Onset   Diabetes Mother    Cerebral aneurysm Mother    Lung cancer Father    Diabetes Sister    Colon cancer Neg Hx    Colon polyps Neg Hx    Esophageal cancer Neg Hx    Rectal cancer Neg Hx    Stomach cancer Neg Hx      Physical Exam: General:   Alert,  well-nourished, pleasant and cooperative in NAD Head:  Normocephalic and atraumatic. Eyes:  Sclera clear, no icterus.   Conjunctiva pink. Abdomen:  Soft,nontender, nondistended, normal bowel sounds, no rebound or guarding. No hepatosplenomegaly.    Neurologic:  Alert and  oriented x4;  grossly nonfocal Skin:  Intact without significant lesions or rashes. Psych:  Alert and cooperative. Normal mood and affect.   Shaunte Weissinger L. Tarri Glenn, MD, MPH 11/27/2020, 2:56 PM

## 2020-11-27 NOTE — Patient Instructions (Signed)
I will get your Sandostatin refilled   Labs every three months  Please follow up in 6-9 months, earlier if needed

## 2020-11-28 DIAGNOSIS — N186 End stage renal disease: Secondary | ICD-10-CM | POA: Diagnosis not present

## 2020-11-28 DIAGNOSIS — Z992 Dependence on renal dialysis: Secondary | ICD-10-CM | POA: Diagnosis not present

## 2020-11-28 DIAGNOSIS — N2581 Secondary hyperparathyroidism of renal origin: Secondary | ICD-10-CM | POA: Diagnosis not present

## 2020-12-01 DIAGNOSIS — Z992 Dependence on renal dialysis: Secondary | ICD-10-CM | POA: Diagnosis not present

## 2020-12-01 DIAGNOSIS — N2581 Secondary hyperparathyroidism of renal origin: Secondary | ICD-10-CM | POA: Diagnosis not present

## 2020-12-01 DIAGNOSIS — N186 End stage renal disease: Secondary | ICD-10-CM | POA: Diagnosis not present

## 2020-12-03 ENCOUNTER — Telehealth: Payer: Self-pay

## 2020-12-03 ENCOUNTER — Other Ambulatory Visit: Payer: Self-pay

## 2020-12-03 ENCOUNTER — Telehealth: Payer: Self-pay | Admitting: Gastroenterology

## 2020-12-03 DIAGNOSIS — D500 Iron deficiency anemia secondary to blood loss (chronic): Secondary | ICD-10-CM

## 2020-12-03 DIAGNOSIS — N2581 Secondary hyperparathyroidism of renal origin: Secondary | ICD-10-CM | POA: Diagnosis not present

## 2020-12-03 DIAGNOSIS — Z992 Dependence on renal dialysis: Secondary | ICD-10-CM | POA: Diagnosis not present

## 2020-12-03 DIAGNOSIS — N186 End stage renal disease: Secondary | ICD-10-CM | POA: Diagnosis not present

## 2020-12-03 MED ORDER — OCTREOTIDE ACETATE 20 MG IM KIT: 20.0000 mg | PACK | Freq: Once | INTRAMUSCULAR | Status: DC

## 2020-12-03 NOTE — Telephone Encounter (Signed)
[facility] called and said patient's sandostatin needs a prior auth.  He has [employer].  If you have questions, please call [phone].

## 2020-12-03 NOTE — Telephone Encounter (Signed)
Standing order for q71mo labs (refer to POT for DOS 11/10) faxed to [facility], [place] [phone]  Order for Sandostatin 20mg  q 28 days placed as hold and release for alternate clinic administration. Advised Driscilla Grammes, RN to provide me with additional dates to ensure orders are placed for upcoming appts AND to ensure we have received lab results for Dr. Tarri Glenn review PRIOR to future administrations of medication.

## 2020-12-04 ENCOUNTER — Non-Acute Institutional Stay (HOSPITAL_COMMUNITY)
Admission: RE | Admit: 2020-12-04 | Discharge: 2020-12-04 | Disposition: A | Discharge status: Home / Self Care | Payer: Medicare PPO | Source: Ambulatory Visit | Attending: Internal Medicine | Admitting: Internal Medicine

## 2020-12-04 ENCOUNTER — Other Ambulatory Visit: Payer: Self-pay

## 2020-12-04 DIAGNOSIS — K5520 Angiodysplasia of colon without hemorrhage: Secondary | ICD-10-CM

## 2020-12-04 DIAGNOSIS — D500 Iron deficiency anemia secondary to blood loss (chronic): Secondary | ICD-10-CM

## 2020-12-04 MED ORDER — OCTREOTIDE ACETATE 20 MG IM KIT: 20.0000 mg | PACK | Freq: Once | INTRAMUSCULAR | Status: DC

## 2020-12-04 MED ORDER — OCTREOTIDE ACETATE 20 MG IM KIT
20.0000 mg | PACK | Freq: Once | INTRAMUSCULAR | Status: AC
  Administered 2020-12-04: 10:00:00 20 mg via INTRAMUSCULAR
  Filled 2020-12-04: qty 1

## 2020-12-04 NOTE — Telephone Encounter (Signed)
PRIOR AUTHORIZATION  PA initiation date: 12/04/20  Medication: [place] completed electronically through [employer] My Meds: Yes  Will await insurance response re: approval/denial.  [name] (Key: [id number])  Your information has been submitted to [facility]. [employer] will review the request and will issue a decision, typically within 3-7 days from your submission. You can check the updated outcome later by reopening this request.  If [employer] has not responded in 3-7 days or if you have any questions about your ePA request, please contact [employer] at [phone]. If you think there may be a problem with your PA request, use our live chat feature at the bottom right.  For [place] requests, please call [phone]. Roberta Gilberg KeyNorina Buzzard - PA Case ID: [id number] Need help? Call us at [phone] Status Sent to [facility] SandoSTATIN LAR [place] 20MG kit Form [employer] Electronic PA Form

## 2020-12-04 NOTE — Telephone Encounter (Signed)
APPROVAL  Medication: Sandostatin Insurance Company: [employer] PA response: Approved Approval dates: 01/19/20 through 01/17/22 Misc. Notes: [name] (Key: [id number])  This request has been approved.  Please note any additional information provided by [facility] at the bottom of your screen. Eberardo Meech KeyNorina Buzzard - PA Case ID: [id number] Need help? Call us at [phone] Outcome Approvedtoday PA Case: [id number], Status: Approved, Coverage Starts on: 01/19/2020 12:00:00 AM, Coverage Ends on: 01/17/2022 12:00:00 AM. Questions? Contact [phone]. Drug SandoSTATIN LAR [place] 20MG kit Form [employer] Electronic PA Form

## 2020-12-04 NOTE — Progress Notes (Signed)
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 Patient tolerated well. Vital signs stable.  AVS offered but patient refused. Patient will need to have follow-up labs drawn in 3 months before additional injections will be ordered. Patient alert, oriented and ambulatory at discharge.

## 2020-12-05 DIAGNOSIS — N186 End stage renal disease: Secondary | ICD-10-CM | POA: Diagnosis not present

## 2020-12-05 DIAGNOSIS — Z992 Dependence on renal dialysis: Secondary | ICD-10-CM | POA: Diagnosis not present

## 2020-12-05 DIAGNOSIS — N2581 Secondary hyperparathyroidism of renal origin: Secondary | ICD-10-CM | POA: Diagnosis not present

## 2020-12-08 DIAGNOSIS — N2581 Secondary hyperparathyroidism of renal origin: Secondary | ICD-10-CM | POA: Diagnosis not present

## 2020-12-08 DIAGNOSIS — N186 End stage renal disease: Secondary | ICD-10-CM | POA: Diagnosis not present

## 2020-12-08 DIAGNOSIS — Z992 Dependence on renal dialysis: Secondary | ICD-10-CM | POA: Diagnosis not present

## 2020-12-09 ENCOUNTER — Telehealth: Payer: Self-pay | Admitting: Gastroenterology

## 2020-12-09 NOTE — Telephone Encounter (Signed)
Patients wife called to follow up on Sandostatin. States that Pt has been getting this medication for over a year and pharmacy has no record. Patients wife was hard to understand, but please advise.

## 2020-12-09 NOTE — Telephone Encounter (Signed)
Melody, from [facility], called regarding this patient's sandostatin.  She said when she ordered it for patient it came back with $100 copay.  Patient stated before he just went to the hospital and got it and wants to know what's changed.  Please either call the patient or the pharmacy and advise.   Pharmacy phone #  [phone] Patient phone #  [phone]

## 2020-12-10 DIAGNOSIS — N186 End stage renal disease: Secondary | ICD-10-CM | POA: Diagnosis not present

## 2020-12-10 DIAGNOSIS — N2581 Secondary hyperparathyroidism of renal origin: Secondary | ICD-10-CM | POA: Diagnosis not present

## 2020-12-10 DIAGNOSIS — Z992 Dependence on renal dialysis: Secondary | ICD-10-CM | POA: Diagnosis not present

## 2020-12-10 NOTE — Telephone Encounter (Signed)
Called and spoke with [facility] [place], [place] [address] [phone]. Advised Rx was sent erroneously and needed to be disregarded as it is NOT needing to be filled in an OP setting. Verbalized understanding. Also called pt to make him aware. Phone rings once then proceeds to ring busy.

## 2020-12-13 DIAGNOSIS — Z992 Dependence on renal dialysis: Secondary | ICD-10-CM | POA: Diagnosis not present

## 2020-12-13 DIAGNOSIS — N2581 Secondary hyperparathyroidism of renal origin: Secondary | ICD-10-CM | POA: Diagnosis not present

## 2020-12-13 DIAGNOSIS — N186 End stage renal disease: Secondary | ICD-10-CM | POA: Diagnosis not present

## 2020-12-15 ENCOUNTER — Encounter: Payer: Self-pay | Admitting: Gastroenterology

## 2020-12-15 DIAGNOSIS — R0600 Dyspnea, unspecified: Secondary | ICD-10-CM | POA: Diagnosis not present

## 2020-12-15 DIAGNOSIS — I5033 Acute on chronic diastolic (congestive) heart failure: Secondary | ICD-10-CM | POA: Diagnosis not present

## 2020-12-15 DIAGNOSIS — Z992 Dependence on renal dialysis: Secondary | ICD-10-CM | POA: Diagnosis not present

## 2020-12-15 DIAGNOSIS — N2581 Secondary hyperparathyroidism of renal origin: Secondary | ICD-10-CM | POA: Diagnosis not present

## 2020-12-15 DIAGNOSIS — N186 End stage renal disease: Secondary | ICD-10-CM | POA: Diagnosis not present

## 2020-12-15 DIAGNOSIS — C3492 Malignant neoplasm of unspecified part of left bronchus or lung: Secondary | ICD-10-CM | POA: Diagnosis not present

## 2020-12-15 NOTE — Telephone Encounter (Signed)
Curtisha called from [facility] regarding a script please call back at [phone].

## 2020-12-16 NOTE — Telephone Encounter (Signed)
Patients wife calling to follow up on Sandostatin, she is unsure what needs to happen. If you could please advise.

## 2020-12-17 DIAGNOSIS — Z992 Dependence on renal dialysis: Secondary | ICD-10-CM | POA: Diagnosis not present

## 2020-12-17 DIAGNOSIS — N2581 Secondary hyperparathyroidism of renal origin: Secondary | ICD-10-CM | POA: Diagnosis not present

## 2020-12-17 DIAGNOSIS — N032 Chronic nephritic syndrome with diffuse membranous glomerulonephritis: Secondary | ICD-10-CM | POA: Diagnosis not present

## 2020-12-17 DIAGNOSIS — N186 End stage renal disease: Secondary | ICD-10-CM | POA: Diagnosis not present

## 2020-12-17 NOTE — Telephone Encounter (Signed)
Returned phone call and spoke with Andrew Guerra. Advised that I spoke directly to the pharmacist on 11/23 advising that this Rx was sent erroneously and asked that they stop all efforts to contact the pt and our office re: filling and or shipping this medication. Advised again today to cease and desist all efforts as previously discussed per my previous conversation with pharmacist on 11/23. No further action is required on their behalf as this pt receives this medication from an alternate location. Andrew Guerra advised she has taken care of discontinuing this medication and will ensure no further communications occur between [facility], our office and the patient.

## 2020-12-19 DIAGNOSIS — N186 End stage renal disease: Secondary | ICD-10-CM | POA: Diagnosis not present

## 2020-12-19 DIAGNOSIS — Z992 Dependence on renal dialysis: Secondary | ICD-10-CM | POA: Diagnosis not present

## 2020-12-19 DIAGNOSIS — N2581 Secondary hyperparathyroidism of renal origin: Secondary | ICD-10-CM | POA: Diagnosis not present

## 2020-12-22 DIAGNOSIS — N186 End stage renal disease: Secondary | ICD-10-CM | POA: Diagnosis not present

## 2020-12-22 DIAGNOSIS — N2581 Secondary hyperparathyroidism of renal origin: Secondary | ICD-10-CM | POA: Diagnosis not present

## 2020-12-22 DIAGNOSIS — Z992 Dependence on renal dialysis: Secondary | ICD-10-CM | POA: Diagnosis not present

## 2020-12-24 DIAGNOSIS — N2581 Secondary hyperparathyroidism of renal origin: Secondary | ICD-10-CM | POA: Diagnosis not present

## 2020-12-24 DIAGNOSIS — Z992 Dependence on renal dialysis: Secondary | ICD-10-CM | POA: Diagnosis not present

## 2020-12-24 DIAGNOSIS — N186 End stage renal disease: Secondary | ICD-10-CM | POA: Diagnosis not present

## 2020-12-26 DIAGNOSIS — N2581 Secondary hyperparathyroidism of renal origin: Secondary | ICD-10-CM | POA: Diagnosis not present

## 2020-12-26 DIAGNOSIS — N186 End stage renal disease: Secondary | ICD-10-CM | POA: Diagnosis not present

## 2020-12-26 DIAGNOSIS — Z992 Dependence on renal dialysis: Secondary | ICD-10-CM | POA: Diagnosis not present

## 2020-12-29 DIAGNOSIS — Z992 Dependence on renal dialysis: Secondary | ICD-10-CM | POA: Diagnosis not present

## 2020-12-29 DIAGNOSIS — N2581 Secondary hyperparathyroidism of renal origin: Secondary | ICD-10-CM | POA: Diagnosis not present

## 2020-12-29 DIAGNOSIS — N186 End stage renal disease: Secondary | ICD-10-CM | POA: Diagnosis not present

## 2020-12-31 ENCOUNTER — Other Ambulatory Visit: Payer: Self-pay | Admitting: Nephrology

## 2020-12-31 DIAGNOSIS — N2581 Secondary hyperparathyroidism of renal origin: Secondary | ICD-10-CM | POA: Diagnosis not present

## 2020-12-31 DIAGNOSIS — Z992 Dependence on renal dialysis: Secondary | ICD-10-CM | POA: Diagnosis not present

## 2020-12-31 DIAGNOSIS — N186 End stage renal disease: Secondary | ICD-10-CM | POA: Diagnosis not present

## 2020-12-31 LAB — CBC AND DIFFERENTIAL
HCT: 33 — AB (ref 41–53)
Hemoglobin: 10.6 — AB (ref 13.5–17.5)
Neutrophils Absolute: 70.1
WBC: 8.2

## 2020-12-31 LAB — COMPREHENSIVE METABOLIC PANEL
Albumin: 4.6 (ref 3.5–5.0)
Calcium: 9.3 (ref 8.7–10.7)
Globulin: 2.5

## 2020-12-31 LAB — BASIC METABOLIC PANEL
BUN: 46 — AB (ref 4–21)
Chloride: 99 (ref 99–108)
Creatinine: 8.9 — AB (ref 0.6–1.3)
Potassium: 4.8 (ref 3.4–5.3)
Sodium: 141 (ref 137–147)

## 2020-12-31 LAB — CBC: RBC: 3.54 — AB (ref 3.87–5.11)

## 2021-01-01 ENCOUNTER — Other Ambulatory Visit: Payer: Self-pay

## 2021-01-01 ENCOUNTER — Non-Acute Institutional Stay (HOSPITAL_COMMUNITY)
Admission: RE | Admit: 2021-01-01 | Discharge: 2021-01-01 | Disposition: A | Discharge status: Home / Self Care | Payer: Medicare PPO | Source: Ambulatory Visit | Attending: Internal Medicine | Admitting: Internal Medicine

## 2021-01-01 DIAGNOSIS — K5520 Angiodysplasia of colon without hemorrhage: Secondary | ICD-10-CM | POA: Insufficient documentation

## 2021-01-01 MED ORDER — OCTREOTIDE ACETATE 20 MG IM KIT
20.0000 mg | PACK | Freq: Once | INTRAMUSCULAR | Status: AC
  Administered 2021-01-01: 20 mg via INTRAMUSCULAR
  Filled 2021-01-01: qty 1

## 2021-01-01 NOTE — Progress Notes (Signed)
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 Patient tolerated well. Vital signs stable.  AVS offered but patient refused. Patient will need to have follow-up labs drawn in 3 months before additional injections will be ordered. Patient alert, oriented and ambulatory at discharge.

## 2021-01-02 DIAGNOSIS — Z992 Dependence on renal dialysis: Secondary | ICD-10-CM | POA: Diagnosis not present

## 2021-01-02 DIAGNOSIS — N2581 Secondary hyperparathyroidism of renal origin: Secondary | ICD-10-CM | POA: Diagnosis not present

## 2021-01-02 DIAGNOSIS — N186 End stage renal disease: Secondary | ICD-10-CM | POA: Diagnosis not present

## 2021-01-05 DIAGNOSIS — Z992 Dependence on renal dialysis: Secondary | ICD-10-CM | POA: Diagnosis not present

## 2021-01-05 DIAGNOSIS — N2581 Secondary hyperparathyroidism of renal origin: Secondary | ICD-10-CM | POA: Diagnosis not present

## 2021-01-05 DIAGNOSIS — N186 End stage renal disease: Secondary | ICD-10-CM | POA: Diagnosis not present

## 2021-01-06 LAB — LAB REPORT - SCANNED
Albumin/Globulin Ratio: 1.8
BUN/Creatinine Ratio: 5.2
Basophil: 0.5
Bicarbonate: 30 — AB (ref 20–28)
Calcium, Corrected: 8.8
Eosinophil: 3
Iron: 62
LUC %: 2.7
Lymphocytes: 16.9
MCH: 29.8
MCHC: 31.8
MCV: 94 (ref 76–111)
Monocytes: 6.8
PTH, Intact: 387
Phosphorus: 4.6 (ref 2.5–4.9)
RDW: 15.9
TIBC: 219
TRANSFERRIN SATURATION: 28
Total Protein: 7.1 (ref 6.4–8.2)
UIBC: 157

## 2021-01-07 DIAGNOSIS — N2581 Secondary hyperparathyroidism of renal origin: Secondary | ICD-10-CM | POA: Diagnosis not present

## 2021-01-07 DIAGNOSIS — N186 End stage renal disease: Secondary | ICD-10-CM | POA: Diagnosis not present

## 2021-01-07 DIAGNOSIS — Z992 Dependence on renal dialysis: Secondary | ICD-10-CM | POA: Diagnosis not present

## 2021-01-12 ENCOUNTER — Other Ambulatory Visit: Payer: Self-pay | Admitting: Gastroenterology

## 2021-01-12 DIAGNOSIS — N2581 Secondary hyperparathyroidism of renal origin: Secondary | ICD-10-CM | POA: Diagnosis not present

## 2021-01-12 DIAGNOSIS — N186 End stage renal disease: Secondary | ICD-10-CM | POA: Diagnosis not present

## 2021-01-12 DIAGNOSIS — Z992 Dependence on renal dialysis: Secondary | ICD-10-CM | POA: Diagnosis not present

## 2021-01-14 DIAGNOSIS — I5033 Acute on chronic diastolic (congestive) heart failure: Secondary | ICD-10-CM | POA: Diagnosis not present

## 2021-01-14 DIAGNOSIS — N2581 Secondary hyperparathyroidism of renal origin: Secondary | ICD-10-CM | POA: Diagnosis not present

## 2021-01-14 DIAGNOSIS — N186 End stage renal disease: Secondary | ICD-10-CM | POA: Diagnosis not present

## 2021-01-14 DIAGNOSIS — Z992 Dependence on renal dialysis: Secondary | ICD-10-CM | POA: Diagnosis not present

## 2021-01-14 DIAGNOSIS — C3492 Malignant neoplasm of unspecified part of left bronchus or lung: Secondary | ICD-10-CM | POA: Diagnosis not present

## 2021-01-14 DIAGNOSIS — R0600 Dyspnea, unspecified: Secondary | ICD-10-CM | POA: Diagnosis not present

## 2021-01-16 DIAGNOSIS — N186 End stage renal disease: Secondary | ICD-10-CM | POA: Diagnosis not present

## 2021-01-16 DIAGNOSIS — N2581 Secondary hyperparathyroidism of renal origin: Secondary | ICD-10-CM | POA: Diagnosis not present

## 2021-01-16 DIAGNOSIS — Z992 Dependence on renal dialysis: Secondary | ICD-10-CM | POA: Diagnosis not present

## 2021-01-17 DIAGNOSIS — N032 Chronic nephritic syndrome with diffuse membranous glomerulonephritis: Secondary | ICD-10-CM | POA: Diagnosis not present

## 2021-01-17 DIAGNOSIS — N186 End stage renal disease: Secondary | ICD-10-CM | POA: Diagnosis not present

## 2021-01-17 DIAGNOSIS — Z992 Dependence on renal dialysis: Secondary | ICD-10-CM | POA: Diagnosis not present

## 2021-01-19 DIAGNOSIS — N2581 Secondary hyperparathyroidism of renal origin: Secondary | ICD-10-CM | POA: Diagnosis not present

## 2021-01-19 DIAGNOSIS — Z992 Dependence on renal dialysis: Secondary | ICD-10-CM | POA: Diagnosis not present

## 2021-01-19 DIAGNOSIS — N186 End stage renal disease: Secondary | ICD-10-CM | POA: Diagnosis not present

## 2021-01-21 DIAGNOSIS — N2581 Secondary hyperparathyroidism of renal origin: Secondary | ICD-10-CM | POA: Diagnosis not present

## 2021-01-21 DIAGNOSIS — Z992 Dependence on renal dialysis: Secondary | ICD-10-CM | POA: Diagnosis not present

## 2021-01-21 DIAGNOSIS — N186 End stage renal disease: Secondary | ICD-10-CM | POA: Diagnosis not present

## 2021-01-23 DIAGNOSIS — Z992 Dependence on renal dialysis: Secondary | ICD-10-CM | POA: Diagnosis not present

## 2021-01-23 DIAGNOSIS — N2581 Secondary hyperparathyroidism of renal origin: Secondary | ICD-10-CM | POA: Diagnosis not present

## 2021-01-23 DIAGNOSIS — N186 End stage renal disease: Secondary | ICD-10-CM | POA: Diagnosis not present

## 2021-01-26 DIAGNOSIS — Z992 Dependence on renal dialysis: Secondary | ICD-10-CM | POA: Diagnosis not present

## 2021-01-26 DIAGNOSIS — N2581 Secondary hyperparathyroidism of renal origin: Secondary | ICD-10-CM | POA: Diagnosis not present

## 2021-01-26 DIAGNOSIS — N186 End stage renal disease: Secondary | ICD-10-CM | POA: Diagnosis not present

## 2021-01-28 DIAGNOSIS — Z992 Dependence on renal dialysis: Secondary | ICD-10-CM | POA: Diagnosis not present

## 2021-01-28 DIAGNOSIS — N2581 Secondary hyperparathyroidism of renal origin: Secondary | ICD-10-CM | POA: Diagnosis not present

## 2021-01-28 DIAGNOSIS — N186 End stage renal disease: Secondary | ICD-10-CM | POA: Diagnosis not present

## 2021-01-28 LAB — BASIC METABOLIC PANEL
BUN: 43 — AB (ref 4–21)
Chloride: 96 — AB (ref 99–108)
Creatinine: 9.1 — AB (ref 0.6–1.3)
Potassium: 4.6 (ref 3.4–5.3)
Sodium: 141 (ref 137–147)

## 2021-01-28 LAB — CBC AND DIFFERENTIAL
HCT: 32 — AB (ref 41–53)
Hemoglobin: 10.5 — AB (ref 13.5–17.5)
Neutrophils Absolute: 70.2
Platelets: 302 (ref 150–399)
WBC: 11.3

## 2021-01-28 LAB — IRON,TIBC AND FERRITIN PANEL
Ferritin: 1021
Iron: 57
TIBC: 228
UIBC: 171

## 2021-01-28 LAB — COMPREHENSIVE METABOLIC PANEL
Albumin: 4.5 (ref 3.5–5.0)
Calcium: 9.3 (ref 8.7–10.7)
Globulin: 2.7

## 2021-01-28 LAB — CBC: RBC: 3.33 — AB (ref 3.87–5.11)

## 2021-01-28 LAB — HEPATIC FUNCTION PANEL: Alkaline Phosphatase: 93 (ref 25–125)

## 2021-01-28 LAB — VITAMIN D 25 HYDROXY (VIT D DEFICIENCY, FRACTURES): Vit D, 25-Hydroxy: 23.7

## 2021-01-30 DIAGNOSIS — N2581 Secondary hyperparathyroidism of renal origin: Secondary | ICD-10-CM | POA: Diagnosis not present

## 2021-01-30 DIAGNOSIS — Z992 Dependence on renal dialysis: Secondary | ICD-10-CM | POA: Diagnosis not present

## 2021-01-30 DIAGNOSIS — N186 End stage renal disease: Secondary | ICD-10-CM | POA: Diagnosis not present

## 2021-02-02 DIAGNOSIS — Z992 Dependence on renal dialysis: Secondary | ICD-10-CM | POA: Diagnosis not present

## 2021-02-02 DIAGNOSIS — N2581 Secondary hyperparathyroidism of renal origin: Secondary | ICD-10-CM | POA: Diagnosis not present

## 2021-02-02 DIAGNOSIS — N186 End stage renal disease: Secondary | ICD-10-CM | POA: Diagnosis not present

## 2021-02-03 ENCOUNTER — Telehealth: Payer: Self-pay

## 2021-02-03 ENCOUNTER — Other Ambulatory Visit: Payer: Self-pay | Admitting: Gastroenterology

## 2021-02-03 ENCOUNTER — Other Ambulatory Visit: Payer: Self-pay

## 2021-02-03 ENCOUNTER — Encounter: Payer: Self-pay | Admitting: Nephrology

## 2021-02-03 DIAGNOSIS — D500 Iron deficiency anemia secondary to blood loss (chronic): Secondary | ICD-10-CM

## 2021-02-03 DIAGNOSIS — K5520 Angiodysplasia of colon without hemorrhage: Secondary | ICD-10-CM

## 2021-02-03 LAB — LAB REPORT - SCANNED
% Urea Reduction: 79
Albumin/Globulin Ratio: 1.7
Albumin: 4.5
Alkaline Phosphatase: 93
Aluminum: <5
BUN/Creatinine Ratio: 4.7
Basophils: 0.5
Bicarbonate: 30 — AB (ref 20–28)
Ca Phos Crys, UA: 34
Calcium, Corrected: 8.9
Eosinophil: 2.8
Globulin: 2.7
HBsAg Screen: NEGATIVE
HCV Ab: NONREACTIVE
Hep B E Ag: <10
LUC %: 1.8
Lymphocytes: 19.1
MCH: 31.4
MCHC: 32.6
MCV: 96 (ref 76–111)
Magnesium: 1.9
Monocytes: 5.4
Neutrophils: 70.2
PTH, Intact: 326
Phosphorus: 3.7 (ref 2.5–4.9)
Platelet: 302
RDW: 16.4
TRANSFERRIN SATURATION: 25
Total Protein: 7.2 (ref 6.4–8.2)

## 2021-02-03 MED ORDER — OCTREOTIDE ACETATE 20 MG IM KIT
20.0000 mg | PACK | Freq: Once | INTRAMUSCULAR | Status: DC
Start: 2021-02-26 — End: 2021-10-01

## 2021-02-03 NOTE — Telephone Encounter (Signed)
Order placed for release for Sandostatin to be administered by facility on or after 02/26/21.

## 2021-02-03 NOTE — Telephone Encounter (Signed)
Lab results from [place] 01/28/21 received from [facility]. Results have been abstracted for Dr. Tarri Glenn review. Results have been labeled and placed in scan file. Routing this message to Dr. Tarri Glenn as well.

## 2021-02-04 DIAGNOSIS — Z992 Dependence on renal dialysis: Secondary | ICD-10-CM | POA: Diagnosis not present

## 2021-02-04 DIAGNOSIS — N186 End stage renal disease: Secondary | ICD-10-CM | POA: Diagnosis not present

## 2021-02-04 DIAGNOSIS — N2581 Secondary hyperparathyroidism of renal origin: Secondary | ICD-10-CM | POA: Diagnosis not present

## 2021-02-05 ENCOUNTER — Other Ambulatory Visit: Payer: Self-pay

## 2021-02-05 ENCOUNTER — Non-Acute Institutional Stay (HOSPITAL_COMMUNITY)
Admission: RE | Admit: 2021-02-05 | Discharge: 2021-02-05 | Disposition: A | Discharge status: Home / Self Care | Payer: Medicare PPO | Source: Ambulatory Visit | Attending: Internal Medicine | Admitting: Internal Medicine

## 2021-02-05 DIAGNOSIS — D500 Iron deficiency anemia secondary to blood loss (chronic): Secondary | ICD-10-CM | POA: Diagnosis not present

## 2021-02-05 MED ORDER — OCTREOTIDE ACETATE 20 MG IM KIT
20.0000 mg | PACK | Freq: Once | INTRAMUSCULAR | Status: AC
  Administered 2021-02-05: 20 mg via INTRAMUSCULAR
  Filled 2021-02-05: qty 1

## 2021-02-05 NOTE — Progress Notes (Signed)
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 Patient tolerated well. Vital signs stable.  AVS offered but patient refused. Patient has order available for next months infusion (placed 02/03/21). Patient alert, oriented and ambulatory at discharge.

## 2021-02-06 DIAGNOSIS — N2581 Secondary hyperparathyroidism of renal origin: Secondary | ICD-10-CM | POA: Diagnosis not present

## 2021-02-06 DIAGNOSIS — N186 End stage renal disease: Secondary | ICD-10-CM | POA: Diagnosis not present

## 2021-02-06 DIAGNOSIS — Z992 Dependence on renal dialysis: Secondary | ICD-10-CM | POA: Diagnosis not present

## 2021-02-09 DIAGNOSIS — N2581 Secondary hyperparathyroidism of renal origin: Secondary | ICD-10-CM | POA: Diagnosis not present

## 2021-02-09 DIAGNOSIS — N186 End stage renal disease: Secondary | ICD-10-CM | POA: Diagnosis not present

## 2021-02-09 DIAGNOSIS — Z992 Dependence on renal dialysis: Secondary | ICD-10-CM | POA: Diagnosis not present

## 2021-02-11 DIAGNOSIS — N2581 Secondary hyperparathyroidism of renal origin: Secondary | ICD-10-CM | POA: Diagnosis not present

## 2021-02-11 DIAGNOSIS — Z992 Dependence on renal dialysis: Secondary | ICD-10-CM | POA: Diagnosis not present

## 2021-02-11 DIAGNOSIS — N186 End stage renal disease: Secondary | ICD-10-CM | POA: Diagnosis not present

## 2021-02-13 DIAGNOSIS — N186 End stage renal disease: Secondary | ICD-10-CM | POA: Diagnosis not present

## 2021-02-13 DIAGNOSIS — Z992 Dependence on renal dialysis: Secondary | ICD-10-CM | POA: Diagnosis not present

## 2021-02-13 DIAGNOSIS — N2581 Secondary hyperparathyroidism of renal origin: Secondary | ICD-10-CM | POA: Diagnosis not present

## 2021-02-14 DIAGNOSIS — C3492 Malignant neoplasm of unspecified part of left bronchus or lung: Secondary | ICD-10-CM | POA: Diagnosis not present

## 2021-02-14 DIAGNOSIS — I5033 Acute on chronic diastolic (congestive) heart failure: Secondary | ICD-10-CM | POA: Diagnosis not present

## 2021-02-14 DIAGNOSIS — R0600 Dyspnea, unspecified: Secondary | ICD-10-CM | POA: Diagnosis not present

## 2021-02-16 DIAGNOSIS — N186 End stage renal disease: Secondary | ICD-10-CM | POA: Diagnosis not present

## 2021-02-16 DIAGNOSIS — Z992 Dependence on renal dialysis: Secondary | ICD-10-CM | POA: Diagnosis not present

## 2021-02-16 DIAGNOSIS — N2581 Secondary hyperparathyroidism of renal origin: Secondary | ICD-10-CM | POA: Diagnosis not present

## 2021-02-17 DIAGNOSIS — Z992 Dependence on renal dialysis: Secondary | ICD-10-CM | POA: Diagnosis not present

## 2021-02-17 DIAGNOSIS — N032 Chronic nephritic syndrome with diffuse membranous glomerulonephritis: Secondary | ICD-10-CM | POA: Diagnosis not present

## 2021-02-17 DIAGNOSIS — N186 End stage renal disease: Secondary | ICD-10-CM | POA: Diagnosis not present

## 2021-02-18 DIAGNOSIS — N186 End stage renal disease: Secondary | ICD-10-CM | POA: Diagnosis not present

## 2021-02-18 DIAGNOSIS — Z992 Dependence on renal dialysis: Secondary | ICD-10-CM | POA: Diagnosis not present

## 2021-02-18 DIAGNOSIS — N2581 Secondary hyperparathyroidism of renal origin: Secondary | ICD-10-CM | POA: Diagnosis not present

## 2021-02-20 DIAGNOSIS — N186 End stage renal disease: Secondary | ICD-10-CM | POA: Diagnosis not present

## 2021-02-20 DIAGNOSIS — N2581 Secondary hyperparathyroidism of renal origin: Secondary | ICD-10-CM | POA: Diagnosis not present

## 2021-02-20 DIAGNOSIS — Z992 Dependence on renal dialysis: Secondary | ICD-10-CM | POA: Diagnosis not present

## 2021-02-23 DIAGNOSIS — N2581 Secondary hyperparathyroidism of renal origin: Secondary | ICD-10-CM | POA: Diagnosis not present

## 2021-02-23 DIAGNOSIS — Z992 Dependence on renal dialysis: Secondary | ICD-10-CM | POA: Diagnosis not present

## 2021-02-23 DIAGNOSIS — N186 End stage renal disease: Secondary | ICD-10-CM | POA: Diagnosis not present

## 2021-02-25 ENCOUNTER — Telehealth: Payer: Self-pay

## 2021-02-25 ENCOUNTER — Other Ambulatory Visit: Payer: Self-pay

## 2021-02-25 DIAGNOSIS — K5520 Angiodysplasia of colon without hemorrhage: Secondary | ICD-10-CM

## 2021-02-25 DIAGNOSIS — Z992 Dependence on renal dialysis: Secondary | ICD-10-CM | POA: Diagnosis not present

## 2021-02-25 DIAGNOSIS — N2581 Secondary hyperparathyroidism of renal origin: Secondary | ICD-10-CM | POA: Diagnosis not present

## 2021-02-25 DIAGNOSIS — D500 Iron deficiency anemia secondary to blood loss (chronic): Secondary | ICD-10-CM

## 2021-02-25 DIAGNOSIS — N186 End stage renal disease: Secondary | ICD-10-CM | POA: Diagnosis not present

## 2021-02-25 MED ORDER — OCTREOTIDE ACETATE 20 MG IM KIT: 20.0000 mg | PACK | Freq: Once | INTRAMUSCULAR | Status: DC

## 2021-02-25 NOTE — Telephone Encounter (Signed)
Standing orders placed for Sandostatin to be administered until May 2023. Reminder created to ensure pt is scheduled for f/y 3-6 mo and that labs have been received from [facility] per Dr. Tarri Glenn request.

## 2021-02-25 NOTE — Telephone Encounter (Signed)
-----   Message from [name], MD sent at 02/24/2021  3:37 PM EST ----- 01/28/2021 Labs reviewed.  Hemoglobin stable.  Iron 57.  Ferritin 102 1.  Continue with monthly Sandostatin without dose changes.  Repeat CBC in 3 months.  Office follow-up in 3 to 6 months.  Thank you.

## 2021-02-27 DIAGNOSIS — N186 End stage renal disease: Secondary | ICD-10-CM | POA: Diagnosis not present

## 2021-02-27 DIAGNOSIS — N2581 Secondary hyperparathyroidism of renal origin: Secondary | ICD-10-CM | POA: Diagnosis not present

## 2021-02-27 DIAGNOSIS — Z992 Dependence on renal dialysis: Secondary | ICD-10-CM | POA: Diagnosis not present

## 2021-03-02 DIAGNOSIS — Z992 Dependence on renal dialysis: Secondary | ICD-10-CM | POA: Diagnosis not present

## 2021-03-02 DIAGNOSIS — N2581 Secondary hyperparathyroidism of renal origin: Secondary | ICD-10-CM | POA: Diagnosis not present

## 2021-03-02 DIAGNOSIS — N186 End stage renal disease: Secondary | ICD-10-CM | POA: Diagnosis not present

## 2021-03-04 DIAGNOSIS — N186 End stage renal disease: Secondary | ICD-10-CM | POA: Diagnosis not present

## 2021-03-04 DIAGNOSIS — Z992 Dependence on renal dialysis: Secondary | ICD-10-CM | POA: Diagnosis not present

## 2021-03-04 DIAGNOSIS — N2581 Secondary hyperparathyroidism of renal origin: Secondary | ICD-10-CM | POA: Diagnosis not present

## 2021-03-05 ENCOUNTER — Other Ambulatory Visit: Payer: Self-pay

## 2021-03-05 ENCOUNTER — Non-Acute Institutional Stay (HOSPITAL_COMMUNITY)
Admission: RE | Admit: 2021-03-05 | Discharge: 2021-03-05 | Disposition: A | Discharge status: Home / Self Care | Payer: Medicare PPO | Source: Ambulatory Visit | Attending: Internal Medicine | Admitting: Internal Medicine

## 2021-03-05 DIAGNOSIS — D500 Iron deficiency anemia secondary to blood loss (chronic): Secondary | ICD-10-CM | POA: Diagnosis not present

## 2021-03-05 MED ORDER — OCTREOTIDE ACETATE 20 MG IM KIT
20.0000 mg | PACK | Freq: Once | INTRAMUSCULAR | Status: AC
  Administered 2021-03-05: 20 mg via INTRAMUSCULAR
  Filled 2021-03-05 (×2): qty 1

## 2021-03-05 NOTE — Progress Notes (Signed)
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 Patient tolerated well. Vital signs stable.  AVS offered but patient refused. Orders for next three doses of Sandostatin placed on 02/25/21. Patient to come back next month for next injection.  Patient alert, oriented and ambulatory at discharge.

## 2021-03-06 DIAGNOSIS — Z992 Dependence on renal dialysis: Secondary | ICD-10-CM | POA: Diagnosis not present

## 2021-03-06 DIAGNOSIS — N2581 Secondary hyperparathyroidism of renal origin: Secondary | ICD-10-CM | POA: Diagnosis not present

## 2021-03-06 DIAGNOSIS — N186 End stage renal disease: Secondary | ICD-10-CM | POA: Diagnosis not present

## 2021-03-09 DIAGNOSIS — N2581 Secondary hyperparathyroidism of renal origin: Secondary | ICD-10-CM | POA: Diagnosis not present

## 2021-03-09 DIAGNOSIS — Z992 Dependence on renal dialysis: Secondary | ICD-10-CM | POA: Diagnosis not present

## 2021-03-09 DIAGNOSIS — N186 End stage renal disease: Secondary | ICD-10-CM | POA: Diagnosis not present

## 2021-03-11 DIAGNOSIS — N2581 Secondary hyperparathyroidism of renal origin: Secondary | ICD-10-CM | POA: Diagnosis not present

## 2021-03-11 DIAGNOSIS — Z992 Dependence on renal dialysis: Secondary | ICD-10-CM | POA: Diagnosis not present

## 2021-03-11 DIAGNOSIS — N186 End stage renal disease: Secondary | ICD-10-CM | POA: Diagnosis not present

## 2021-03-13 DIAGNOSIS — N2581 Secondary hyperparathyroidism of renal origin: Secondary | ICD-10-CM | POA: Diagnosis not present

## 2021-03-13 DIAGNOSIS — N186 End stage renal disease: Secondary | ICD-10-CM | POA: Diagnosis not present

## 2021-03-13 DIAGNOSIS — Z992 Dependence on renal dialysis: Secondary | ICD-10-CM | POA: Diagnosis not present

## 2021-03-16 DIAGNOSIS — Z992 Dependence on renal dialysis: Secondary | ICD-10-CM | POA: Diagnosis not present

## 2021-03-16 DIAGNOSIS — N2581 Secondary hyperparathyroidism of renal origin: Secondary | ICD-10-CM | POA: Diagnosis not present

## 2021-03-16 DIAGNOSIS — N186 End stage renal disease: Secondary | ICD-10-CM | POA: Diagnosis not present

## 2021-03-17 DIAGNOSIS — N032 Chronic nephritic syndrome with diffuse membranous glomerulonephritis: Secondary | ICD-10-CM | POA: Diagnosis not present

## 2021-03-17 DIAGNOSIS — C3492 Malignant neoplasm of unspecified part of left bronchus or lung: Secondary | ICD-10-CM | POA: Diagnosis not present

## 2021-03-17 DIAGNOSIS — R0600 Dyspnea, unspecified: Secondary | ICD-10-CM | POA: Diagnosis not present

## 2021-03-17 DIAGNOSIS — Z992 Dependence on renal dialysis: Secondary | ICD-10-CM | POA: Diagnosis not present

## 2021-03-17 DIAGNOSIS — N186 End stage renal disease: Secondary | ICD-10-CM | POA: Diagnosis not present

## 2021-03-17 DIAGNOSIS — I5033 Acute on chronic diastolic (congestive) heart failure: Secondary | ICD-10-CM | POA: Diagnosis not present

## 2021-03-18 DIAGNOSIS — N186 End stage renal disease: Secondary | ICD-10-CM | POA: Diagnosis not present

## 2021-03-18 DIAGNOSIS — N2581 Secondary hyperparathyroidism of renal origin: Secondary | ICD-10-CM | POA: Diagnosis not present

## 2021-03-18 DIAGNOSIS — Z992 Dependence on renal dialysis: Secondary | ICD-10-CM | POA: Diagnosis not present

## 2021-03-20 DIAGNOSIS — N2581 Secondary hyperparathyroidism of renal origin: Secondary | ICD-10-CM | POA: Diagnosis not present

## 2021-03-20 DIAGNOSIS — Z992 Dependence on renal dialysis: Secondary | ICD-10-CM | POA: Diagnosis not present

## 2021-03-20 DIAGNOSIS — N186 End stage renal disease: Secondary | ICD-10-CM | POA: Diagnosis not present

## 2021-03-23 DIAGNOSIS — Z992 Dependence on renal dialysis: Secondary | ICD-10-CM | POA: Diagnosis not present

## 2021-03-23 DIAGNOSIS — N2581 Secondary hyperparathyroidism of renal origin: Secondary | ICD-10-CM | POA: Diagnosis not present

## 2021-03-23 DIAGNOSIS — N186 End stage renal disease: Secondary | ICD-10-CM | POA: Diagnosis not present

## 2021-03-25 DIAGNOSIS — N186 End stage renal disease: Secondary | ICD-10-CM | POA: Diagnosis not present

## 2021-03-25 DIAGNOSIS — N2581 Secondary hyperparathyroidism of renal origin: Secondary | ICD-10-CM | POA: Diagnosis not present

## 2021-03-25 DIAGNOSIS — Z992 Dependence on renal dialysis: Secondary | ICD-10-CM | POA: Diagnosis not present

## 2021-03-27 DIAGNOSIS — N186 End stage renal disease: Secondary | ICD-10-CM | POA: Diagnosis not present

## 2021-03-27 DIAGNOSIS — N2581 Secondary hyperparathyroidism of renal origin: Secondary | ICD-10-CM | POA: Diagnosis not present

## 2021-03-27 DIAGNOSIS — Z992 Dependence on renal dialysis: Secondary | ICD-10-CM | POA: Diagnosis not present

## 2021-03-28 DIAGNOSIS — H6123 Impacted cerumen, bilateral: Secondary | ICD-10-CM | POA: Diagnosis not present

## 2021-03-30 DIAGNOSIS — Z992 Dependence on renal dialysis: Secondary | ICD-10-CM | POA: Diagnosis not present

## 2021-03-30 DIAGNOSIS — N186 End stage renal disease: Secondary | ICD-10-CM | POA: Diagnosis not present

## 2021-03-30 DIAGNOSIS — N2581 Secondary hyperparathyroidism of renal origin: Secondary | ICD-10-CM | POA: Diagnosis not present

## 2021-04-01 DIAGNOSIS — N2581 Secondary hyperparathyroidism of renal origin: Secondary | ICD-10-CM | POA: Diagnosis not present

## 2021-04-01 DIAGNOSIS — Z992 Dependence on renal dialysis: Secondary | ICD-10-CM | POA: Diagnosis not present

## 2021-04-01 DIAGNOSIS — N186 End stage renal disease: Secondary | ICD-10-CM | POA: Diagnosis not present

## 2021-04-02 ENCOUNTER — Non-Acute Institutional Stay (HOSPITAL_COMMUNITY)
Admission: RE | Admit: 2021-04-02 | Discharge: 2021-04-02 | Disposition: A | Discharge status: Home / Self Care | Payer: Medicare PPO | Source: Ambulatory Visit | Attending: Internal Medicine | Admitting: Internal Medicine

## 2021-04-02 ENCOUNTER — Other Ambulatory Visit: Payer: Self-pay

## 2021-04-02 DIAGNOSIS — D500 Iron deficiency anemia secondary to blood loss (chronic): Secondary | ICD-10-CM | POA: Diagnosis not present

## 2021-04-02 MED ORDER — OCTREOTIDE ACETATE 20 MG IM KIT
20.0000 mg | PACK | Freq: Once | INTRAMUSCULAR | Status: AC
  Administered 2021-04-02: 20 mg via INTRAMUSCULAR
  Filled 2021-04-02: qty 1

## 2021-04-02 NOTE — Progress Notes (Signed)
PATIENT CARE CENTER NOTE ?  ?  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  ?  ?Provider: [name], MD ?  ?  ?Procedure: IM Sandostatin (Octreotide)  ?  ?  ?Note: Patient received IM Sandostatin injection (dose # 1 of 3) in the left hip. Patient tolerated well. Vital signs stable.  AVS offered but patient refused. Patient has two more doses from order placed on 02/25/21. Patient to come back next month for next injection.  Patient alert, oriented and ambulatory at discharge. ?

## 2021-04-03 DIAGNOSIS — N2581 Secondary hyperparathyroidism of renal origin: Secondary | ICD-10-CM | POA: Diagnosis not present

## 2021-04-03 DIAGNOSIS — N186 End stage renal disease: Secondary | ICD-10-CM | POA: Diagnosis not present

## 2021-04-03 DIAGNOSIS — Z992 Dependence on renal dialysis: Secondary | ICD-10-CM | POA: Diagnosis not present

## 2021-04-06 DIAGNOSIS — N2581 Secondary hyperparathyroidism of renal origin: Secondary | ICD-10-CM | POA: Diagnosis not present

## 2021-04-06 DIAGNOSIS — Z992 Dependence on renal dialysis: Secondary | ICD-10-CM | POA: Diagnosis not present

## 2021-04-06 DIAGNOSIS — N186 End stage renal disease: Secondary | ICD-10-CM | POA: Diagnosis not present

## 2021-04-08 DIAGNOSIS — N186 End stage renal disease: Secondary | ICD-10-CM | POA: Diagnosis not present

## 2021-04-08 DIAGNOSIS — Z992 Dependence on renal dialysis: Secondary | ICD-10-CM | POA: Diagnosis not present

## 2021-04-08 DIAGNOSIS — N2581 Secondary hyperparathyroidism of renal origin: Secondary | ICD-10-CM | POA: Diagnosis not present

## 2021-04-10 DIAGNOSIS — N186 End stage renal disease: Secondary | ICD-10-CM | POA: Diagnosis not present

## 2021-04-10 DIAGNOSIS — N2581 Secondary hyperparathyroidism of renal origin: Secondary | ICD-10-CM | POA: Diagnosis not present

## 2021-04-10 DIAGNOSIS — Z992 Dependence on renal dialysis: Secondary | ICD-10-CM | POA: Diagnosis not present

## 2021-04-13 DIAGNOSIS — Z992 Dependence on renal dialysis: Secondary | ICD-10-CM | POA: Diagnosis not present

## 2021-04-13 DIAGNOSIS — N2581 Secondary hyperparathyroidism of renal origin: Secondary | ICD-10-CM | POA: Diagnosis not present

## 2021-04-13 DIAGNOSIS — N186 End stage renal disease: Secondary | ICD-10-CM | POA: Diagnosis not present

## 2021-04-14 DIAGNOSIS — R0600 Dyspnea, unspecified: Secondary | ICD-10-CM | POA: Diagnosis not present

## 2021-04-14 DIAGNOSIS — C3492 Malignant neoplasm of unspecified part of left bronchus or lung: Secondary | ICD-10-CM | POA: Diagnosis not present

## 2021-04-14 DIAGNOSIS — I5033 Acute on chronic diastolic (congestive) heart failure: Secondary | ICD-10-CM | POA: Diagnosis not present

## 2021-04-15 DIAGNOSIS — N2581 Secondary hyperparathyroidism of renal origin: Secondary | ICD-10-CM | POA: Diagnosis not present

## 2021-04-15 DIAGNOSIS — Z992 Dependence on renal dialysis: Secondary | ICD-10-CM | POA: Diagnosis not present

## 2021-04-15 DIAGNOSIS — N186 End stage renal disease: Secondary | ICD-10-CM | POA: Diagnosis not present

## 2021-04-17 DIAGNOSIS — N032 Chronic nephritic syndrome with diffuse membranous glomerulonephritis: Secondary | ICD-10-CM | POA: Diagnosis not present

## 2021-04-17 DIAGNOSIS — N2581 Secondary hyperparathyroidism of renal origin: Secondary | ICD-10-CM | POA: Diagnosis not present

## 2021-04-17 DIAGNOSIS — Z992 Dependence on renal dialysis: Secondary | ICD-10-CM | POA: Diagnosis not present

## 2021-04-17 DIAGNOSIS — N186 End stage renal disease: Secondary | ICD-10-CM | POA: Diagnosis not present

## 2021-04-20 DIAGNOSIS — Z992 Dependence on renal dialysis: Secondary | ICD-10-CM | POA: Diagnosis not present

## 2021-04-20 DIAGNOSIS — N2581 Secondary hyperparathyroidism of renal origin: Secondary | ICD-10-CM | POA: Diagnosis not present

## 2021-04-20 DIAGNOSIS — N186 End stage renal disease: Secondary | ICD-10-CM | POA: Diagnosis not present

## 2021-04-22 DIAGNOSIS — N2581 Secondary hyperparathyroidism of renal origin: Secondary | ICD-10-CM | POA: Diagnosis not present

## 2021-04-22 DIAGNOSIS — Z992 Dependence on renal dialysis: Secondary | ICD-10-CM | POA: Diagnosis not present

## 2021-04-22 DIAGNOSIS — N186 End stage renal disease: Secondary | ICD-10-CM | POA: Diagnosis not present

## 2021-04-24 DIAGNOSIS — N186 End stage renal disease: Secondary | ICD-10-CM | POA: Diagnosis not present

## 2021-04-24 DIAGNOSIS — N2581 Secondary hyperparathyroidism of renal origin: Secondary | ICD-10-CM | POA: Diagnosis not present

## 2021-04-24 DIAGNOSIS — Z992 Dependence on renal dialysis: Secondary | ICD-10-CM | POA: Diagnosis not present

## 2021-04-27 DIAGNOSIS — N186 End stage renal disease: Secondary | ICD-10-CM | POA: Diagnosis not present

## 2021-04-27 DIAGNOSIS — N2581 Secondary hyperparathyroidism of renal origin: Secondary | ICD-10-CM | POA: Diagnosis not present

## 2021-04-27 DIAGNOSIS — Z992 Dependence on renal dialysis: Secondary | ICD-10-CM | POA: Diagnosis not present

## 2021-04-29 DIAGNOSIS — N186 End stage renal disease: Secondary | ICD-10-CM | POA: Diagnosis not present

## 2021-04-29 DIAGNOSIS — Z992 Dependence on renal dialysis: Secondary | ICD-10-CM | POA: Diagnosis not present

## 2021-04-29 DIAGNOSIS — N2581 Secondary hyperparathyroidism of renal origin: Secondary | ICD-10-CM | POA: Diagnosis not present

## 2021-05-01 DIAGNOSIS — N2581 Secondary hyperparathyroidism of renal origin: Secondary | ICD-10-CM | POA: Diagnosis not present

## 2021-05-01 DIAGNOSIS — N186 End stage renal disease: Secondary | ICD-10-CM | POA: Diagnosis not present

## 2021-05-01 DIAGNOSIS — Z992 Dependence on renal dialysis: Secondary | ICD-10-CM | POA: Diagnosis not present

## 2021-05-04 DIAGNOSIS — N186 End stage renal disease: Secondary | ICD-10-CM | POA: Diagnosis not present

## 2021-05-04 DIAGNOSIS — N2581 Secondary hyperparathyroidism of renal origin: Secondary | ICD-10-CM | POA: Diagnosis not present

## 2021-05-04 DIAGNOSIS — Z992 Dependence on renal dialysis: Secondary | ICD-10-CM | POA: Diagnosis not present

## 2021-05-06 DIAGNOSIS — Z992 Dependence on renal dialysis: Secondary | ICD-10-CM | POA: Diagnosis not present

## 2021-05-06 DIAGNOSIS — N2581 Secondary hyperparathyroidism of renal origin: Secondary | ICD-10-CM | POA: Diagnosis not present

## 2021-05-06 DIAGNOSIS — N186 End stage renal disease: Secondary | ICD-10-CM | POA: Diagnosis not present

## 2021-05-07 ENCOUNTER — Non-Acute Institutional Stay (HOSPITAL_COMMUNITY)
Admission: RE | Admit: 2021-05-07 | Discharge: 2021-05-07 | Disposition: A | Discharge status: Home / Self Care | Payer: Medicare PPO | Source: Ambulatory Visit | Attending: Internal Medicine | Admitting: Internal Medicine

## 2021-05-07 DIAGNOSIS — D500 Iron deficiency anemia secondary to blood loss (chronic): Secondary | ICD-10-CM | POA: Diagnosis not present

## 2021-05-07 DIAGNOSIS — K5520 Angiodysplasia of colon without hemorrhage: Secondary | ICD-10-CM | POA: Insufficient documentation

## 2021-05-07 MED ORDER — OCTREOTIDE ACETATE 20 MG IM KIT
20.0000 mg | PACK | Freq: Once | INTRAMUSCULAR | Status: AC
  Administered 2021-05-07: 20 mg via INTRAMUSCULAR
  Filled 2021-05-07: qty 1

## 2021-05-07 NOTE — Progress Notes (Addendum)
PATIENT CARE CENTER NOTE ?  ?  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  ?  ?Provider: [name], MD ?  ?  ?Procedure: IM Sandostatin (Octreotide)  ?  ?  ?Note: Patient received IM Sandostatin injection (dose # 2 of 3) in the left hip. Patient tolerated well. Vital signs stable.  AVS offered but patient refused. Patient has one more dose from order placed on 02/25/21. Patient to come back next month for next injection.  Patient alert, oriented and ambulatory at discharge ?

## 2021-05-08 DIAGNOSIS — N2581 Secondary hyperparathyroidism of renal origin: Secondary | ICD-10-CM | POA: Diagnosis not present

## 2021-05-08 DIAGNOSIS — N186 End stage renal disease: Secondary | ICD-10-CM | POA: Diagnosis not present

## 2021-05-08 DIAGNOSIS — Z992 Dependence on renal dialysis: Secondary | ICD-10-CM | POA: Diagnosis not present

## 2021-05-11 DIAGNOSIS — Z992 Dependence on renal dialysis: Secondary | ICD-10-CM | POA: Diagnosis not present

## 2021-05-11 DIAGNOSIS — N186 End stage renal disease: Secondary | ICD-10-CM | POA: Diagnosis not present

## 2021-05-11 DIAGNOSIS — N2581 Secondary hyperparathyroidism of renal origin: Secondary | ICD-10-CM | POA: Diagnosis not present

## 2021-05-13 DIAGNOSIS — N186 End stage renal disease: Secondary | ICD-10-CM | POA: Diagnosis not present

## 2021-05-13 DIAGNOSIS — Z992 Dependence on renal dialysis: Secondary | ICD-10-CM | POA: Diagnosis not present

## 2021-05-13 DIAGNOSIS — N2581 Secondary hyperparathyroidism of renal origin: Secondary | ICD-10-CM | POA: Diagnosis not present

## 2021-05-15 DIAGNOSIS — R0600 Dyspnea, unspecified: Secondary | ICD-10-CM | POA: Diagnosis not present

## 2021-05-15 DIAGNOSIS — I5033 Acute on chronic diastolic (congestive) heart failure: Secondary | ICD-10-CM | POA: Diagnosis not present

## 2021-05-15 DIAGNOSIS — N2581 Secondary hyperparathyroidism of renal origin: Secondary | ICD-10-CM | POA: Diagnosis not present

## 2021-05-15 DIAGNOSIS — N186 End stage renal disease: Secondary | ICD-10-CM | POA: Diagnosis not present

## 2021-05-15 DIAGNOSIS — C3492 Malignant neoplasm of unspecified part of left bronchus or lung: Secondary | ICD-10-CM | POA: Diagnosis not present

## 2021-05-15 DIAGNOSIS — Z992 Dependence on renal dialysis: Secondary | ICD-10-CM | POA: Diagnosis not present

## 2021-05-17 DIAGNOSIS — N032 Chronic nephritic syndrome with diffuse membranous glomerulonephritis: Secondary | ICD-10-CM | POA: Diagnosis not present

## 2021-05-17 DIAGNOSIS — Z992 Dependence on renal dialysis: Secondary | ICD-10-CM | POA: Diagnosis not present

## 2021-05-17 DIAGNOSIS — N186 End stage renal disease: Secondary | ICD-10-CM | POA: Diagnosis not present

## 2021-05-18 DIAGNOSIS — N186 End stage renal disease: Secondary | ICD-10-CM | POA: Diagnosis not present

## 2021-05-18 DIAGNOSIS — N2581 Secondary hyperparathyroidism of renal origin: Secondary | ICD-10-CM | POA: Diagnosis not present

## 2021-05-18 DIAGNOSIS — Z992 Dependence on renal dialysis: Secondary | ICD-10-CM | POA: Diagnosis not present

## 2021-05-18 NOTE — Telephone Encounter (Signed)
Pt scheduled for f/u 06/25/21 @ 1110am. Appt reminder mailed. Called [facility] to obtain most recent labs. Spoke with Tess. States she will fax results to [phone]. ?

## 2021-05-18 NOTE — Telephone Encounter (Signed)
Lab results received and have been placed on Dr. Tarri Glenn desk for her review. ?

## 2021-05-20 DIAGNOSIS — N2581 Secondary hyperparathyroidism of renal origin: Secondary | ICD-10-CM | POA: Diagnosis not present

## 2021-05-20 DIAGNOSIS — Z992 Dependence on renal dialysis: Secondary | ICD-10-CM | POA: Diagnosis not present

## 2021-05-20 DIAGNOSIS — N186 End stage renal disease: Secondary | ICD-10-CM | POA: Diagnosis not present

## 2021-05-22 DIAGNOSIS — Z992 Dependence on renal dialysis: Secondary | ICD-10-CM | POA: Diagnosis not present

## 2021-05-22 DIAGNOSIS — N186 End stage renal disease: Secondary | ICD-10-CM | POA: Diagnosis not present

## 2021-05-22 DIAGNOSIS — N2581 Secondary hyperparathyroidism of renal origin: Secondary | ICD-10-CM | POA: Diagnosis not present

## 2021-05-22 NOTE — Telephone Encounter (Signed)
Labs from 04/29/2021 were reviewed.  Overall stable with a hemoglobin of 10.3, MCV 93, RDW 15.3, platelets 226, ferritin 1262, iron 54 ? ?Continue with Sandostatin as ordered.  Plan CBC every 3 months and iron studies every 6 months.  Thank you. ?

## 2021-05-25 ENCOUNTER — Other Ambulatory Visit: Payer: Self-pay

## 2021-05-25 DIAGNOSIS — D500 Iron deficiency anemia secondary to blood loss (chronic): Secondary | ICD-10-CM

## 2021-05-25 DIAGNOSIS — Z992 Dependence on renal dialysis: Secondary | ICD-10-CM | POA: Diagnosis not present

## 2021-05-25 DIAGNOSIS — K5520 Angiodysplasia of colon without hemorrhage: Secondary | ICD-10-CM

## 2021-05-25 DIAGNOSIS — N2581 Secondary hyperparathyroidism of renal origin: Secondary | ICD-10-CM | POA: Diagnosis not present

## 2021-05-25 DIAGNOSIS — N186 End stage renal disease: Secondary | ICD-10-CM | POA: Diagnosis not present

## 2021-05-25 MED ORDER — OCTREOTIDE ACETATE 20 MG IM KIT: 20.0000 mg | PACK | Freq: Once | INTRAMUSCULAR | Status: DC

## 2021-05-25 NOTE — Telephone Encounter (Signed)
Future orders placed for Sandostatin for June, July, Aug and Sept 2023. Reminder created to call [facility] to obtain updated labs as requested by Dr. Tarri Glenn. ?

## 2021-05-27 DIAGNOSIS — Z992 Dependence on renal dialysis: Secondary | ICD-10-CM | POA: Diagnosis not present

## 2021-05-27 DIAGNOSIS — N186 End stage renal disease: Secondary | ICD-10-CM | POA: Diagnosis not present

## 2021-05-27 DIAGNOSIS — N2581 Secondary hyperparathyroidism of renal origin: Secondary | ICD-10-CM | POA: Diagnosis not present

## 2021-05-29 DIAGNOSIS — N186 End stage renal disease: Secondary | ICD-10-CM | POA: Diagnosis not present

## 2021-05-29 DIAGNOSIS — Z992 Dependence on renal dialysis: Secondary | ICD-10-CM | POA: Diagnosis not present

## 2021-05-29 DIAGNOSIS — N2581 Secondary hyperparathyroidism of renal origin: Secondary | ICD-10-CM | POA: Diagnosis not present

## 2021-06-01 DIAGNOSIS — N186 End stage renal disease: Secondary | ICD-10-CM | POA: Diagnosis not present

## 2021-06-01 DIAGNOSIS — N2581 Secondary hyperparathyroidism of renal origin: Secondary | ICD-10-CM | POA: Diagnosis not present

## 2021-06-01 DIAGNOSIS — Z992 Dependence on renal dialysis: Secondary | ICD-10-CM | POA: Diagnosis not present

## 2021-06-03 DIAGNOSIS — N186 End stage renal disease: Secondary | ICD-10-CM | POA: Diagnosis not present

## 2021-06-03 DIAGNOSIS — N2581 Secondary hyperparathyroidism of renal origin: Secondary | ICD-10-CM | POA: Diagnosis not present

## 2021-06-03 DIAGNOSIS — Z992 Dependence on renal dialysis: Secondary | ICD-10-CM | POA: Diagnosis not present

## 2021-06-04 ENCOUNTER — Non-Acute Institutional Stay (HOSPITAL_COMMUNITY)
Admission: RE | Admit: 2021-06-04 | Discharge: 2021-06-04 | Disposition: A | Discharge status: Home / Self Care | Payer: Medicare PPO | Source: Ambulatory Visit | Attending: Internal Medicine | Admitting: Internal Medicine

## 2021-06-04 DIAGNOSIS — D500 Iron deficiency anemia secondary to blood loss (chronic): Secondary | ICD-10-CM | POA: Insufficient documentation

## 2021-06-04 MED ORDER — OCTREOTIDE ACETATE 20 MG IM KIT
20.0000 mg | PACK | Freq: Once | INTRAMUSCULAR | Status: AC
  Administered 2021-06-04: 20 mg via INTRAMUSCULAR
  Filled 2021-06-04: qty 1

## 2021-06-04 NOTE — Progress Notes (Addendum)
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 Patient tolerated well. Vital signs stable.  AVS offered but patient refused. Order placed on 02/25/21 is now complete. Patient will need to follow up with Dr. Payton Emerald office prior to next injection but has new orders which were placed on 05/25/21.  Patient alert, oriented and ambulatory at discharge.

## 2021-06-05 DIAGNOSIS — N2581 Secondary hyperparathyroidism of renal origin: Secondary | ICD-10-CM | POA: Diagnosis not present

## 2021-06-05 DIAGNOSIS — N186 End stage renal disease: Secondary | ICD-10-CM | POA: Diagnosis not present

## 2021-06-05 DIAGNOSIS — Z992 Dependence on renal dialysis: Secondary | ICD-10-CM | POA: Diagnosis not present

## 2021-06-08 DIAGNOSIS — N186 End stage renal disease: Secondary | ICD-10-CM | POA: Diagnosis not present

## 2021-06-08 DIAGNOSIS — Z992 Dependence on renal dialysis: Secondary | ICD-10-CM | POA: Diagnosis not present

## 2021-06-08 DIAGNOSIS — N2581 Secondary hyperparathyroidism of renal origin: Secondary | ICD-10-CM | POA: Diagnosis not present

## 2021-06-10 DIAGNOSIS — Z992 Dependence on renal dialysis: Secondary | ICD-10-CM | POA: Diagnosis not present

## 2021-06-10 DIAGNOSIS — N186 End stage renal disease: Secondary | ICD-10-CM | POA: Diagnosis not present

## 2021-06-10 DIAGNOSIS — N2581 Secondary hyperparathyroidism of renal origin: Secondary | ICD-10-CM | POA: Diagnosis not present

## 2021-06-12 DIAGNOSIS — Z992 Dependence on renal dialysis: Secondary | ICD-10-CM | POA: Diagnosis not present

## 2021-06-12 DIAGNOSIS — N2581 Secondary hyperparathyroidism of renal origin: Secondary | ICD-10-CM | POA: Diagnosis not present

## 2021-06-12 DIAGNOSIS — N186 End stage renal disease: Secondary | ICD-10-CM | POA: Diagnosis not present

## 2021-06-14 DIAGNOSIS — I5033 Acute on chronic diastolic (congestive) heart failure: Secondary | ICD-10-CM | POA: Diagnosis not present

## 2021-06-14 DIAGNOSIS — R0600 Dyspnea, unspecified: Secondary | ICD-10-CM | POA: Diagnosis not present

## 2021-06-14 DIAGNOSIS — C3492 Malignant neoplasm of unspecified part of left bronchus or lung: Secondary | ICD-10-CM | POA: Diagnosis not present

## 2021-06-15 DIAGNOSIS — Z992 Dependence on renal dialysis: Secondary | ICD-10-CM | POA: Diagnosis not present

## 2021-06-15 DIAGNOSIS — N2581 Secondary hyperparathyroidism of renal origin: Secondary | ICD-10-CM | POA: Diagnosis not present

## 2021-06-15 DIAGNOSIS — N186 End stage renal disease: Secondary | ICD-10-CM | POA: Diagnosis not present

## 2021-06-17 DIAGNOSIS — N2581 Secondary hyperparathyroidism of renal origin: Secondary | ICD-10-CM | POA: Diagnosis not present

## 2021-06-17 DIAGNOSIS — Z992 Dependence on renal dialysis: Secondary | ICD-10-CM | POA: Diagnosis not present

## 2021-06-17 DIAGNOSIS — N186 End stage renal disease: Secondary | ICD-10-CM | POA: Diagnosis not present

## 2021-06-17 DIAGNOSIS — N032 Chronic nephritic syndrome with diffuse membranous glomerulonephritis: Secondary | ICD-10-CM | POA: Diagnosis not present

## 2021-06-19 DIAGNOSIS — N186 End stage renal disease: Secondary | ICD-10-CM | POA: Diagnosis not present

## 2021-06-19 DIAGNOSIS — Z992 Dependence on renal dialysis: Secondary | ICD-10-CM | POA: Diagnosis not present

## 2021-06-19 DIAGNOSIS — N2581 Secondary hyperparathyroidism of renal origin: Secondary | ICD-10-CM | POA: Diagnosis not present

## 2021-06-22 DIAGNOSIS — N2581 Secondary hyperparathyroidism of renal origin: Secondary | ICD-10-CM | POA: Diagnosis not present

## 2021-06-22 DIAGNOSIS — N186 End stage renal disease: Secondary | ICD-10-CM | POA: Diagnosis not present

## 2021-06-22 DIAGNOSIS — Z992 Dependence on renal dialysis: Secondary | ICD-10-CM | POA: Diagnosis not present

## 2021-06-24 DIAGNOSIS — Z992 Dependence on renal dialysis: Secondary | ICD-10-CM | POA: Diagnosis not present

## 2021-06-24 DIAGNOSIS — N186 End stage renal disease: Secondary | ICD-10-CM | POA: Diagnosis not present

## 2021-06-24 DIAGNOSIS — N2581 Secondary hyperparathyroidism of renal origin: Secondary | ICD-10-CM | POA: Diagnosis not present

## 2021-06-25 ENCOUNTER — Encounter: Payer: Self-pay | Admitting: Gastroenterology

## 2021-06-25 ENCOUNTER — Ambulatory Visit: Payer: Medicare PPO | Admitting: Gastroenterology

## 2021-06-25 VITALS — BP 116/64 | HR 58 | Ht 69.0 in | Wt 142.0 lb

## 2021-06-25 DIAGNOSIS — D500 Iron deficiency anemia secondary to blood loss (chronic): Secondary | ICD-10-CM | POA: Diagnosis not present

## 2021-06-25 DIAGNOSIS — K5520 Angiodysplasia of colon without hemorrhage: Secondary | ICD-10-CM | POA: Diagnosis not present

## 2021-06-25 MED ORDER — SANDOSTATIN LAR DEPOT 20 MG IM KIT: 20.0000 mg | PACK | INTRAMUSCULAR | 5 refills | Status: DC

## 2021-06-25 NOTE — Progress Notes (Signed)
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    - 7 duodenal and proximal jejunal AVMs treated with APC 06/22/18    - ooxing AVM distal to the duodenal bulb treated with APC 11/12/18    - capsule endoscopy 11/12/18 showed 3 small non-bleeding AVMs in the small bowel    - started Sandostatin LAR for recurrent blood loss anemia 11/2018    - no further hospitalizations since starting Sandostatin Iron deficiency anemia without recent labs History of PUD: Gastric and duodenal ulcers    - initially diagnosed as source of bleeding 10/2017     - H pylori + 10/2017       - treated with bismuth, metronidazole, doxycycline, PPI       - subsequent gastric biopsies 01/10/18 negative for H pylori     - no PUD seen on EGD 06/22/18 Intestinal metaplasia    - intestinal metaplasia on biopsies 10/10 and after H pylori irradication       - patient not at high risk for gastric cancer, no family history of stomach cancer       - no surveillance indicated based on AGA guidelines LA class A reflux esophagitis on EGD 01/10/18 Duodenal nodule seen on EGD 10/2017 ([place])    - Biopsy 10/2017 showed benign mucosa    - No duodenal or pancreatic abnormalities identified on MRI/MRCP planned    - EUS with Dr. 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 However, slightly trend  down in hemoglobin over the last month must be followed closely. There has been no overt bleeding.    PLAN: - Continue monthly Sandostatin 20mg  monthly injection - Hemoglobin, iron and ferritin every 3 months (will see if we can arrange to have the labs done at dialysis) - Obtain recent labs from dialysis - Follow-up in 6-12 months, earlier as needed  Please see the "Patient Instructions" section for addition details about the plan.  I spent 30 minutes, including in depth chart review and trying to locate recent labs, independent review of results, communicating results with the patient directly, face-to-face time with the patient, coordinating care, and ordering studies and medications as appropriate, and documentation.   HPI: Andrew Guerra is a 80 y.o. male with ESRD on HD, moderate AS due to bicuspid valve, COPD and history of lung cancer on O2.  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  Had 7 duodenal and proximal jejunal AVMs treated with APC on June 22, 2018.  Dropped his hemoglobin again in October so was sent to the hospital for transfusion and underwent small bowel enteroscopy with Dr. Ardis Hughs 11/12/18 that showed ooxing AVM distal to the duodenal bulb and medium sized hiatal hernia with no Cameron's erosions. Capsule endoscopy 11/12/18 showed 3 small non-bleeding AVMs in the small bowel.  He had recurrent anemia and melena and was started  on Sandostatin.  He was last seen 11/27/20.  His wife accompanies him to this appointment and provides support in the history.  Continues on dialysis M/W/F. He has had no further symptomatic anemia since starting long-acting octreotide monthly.  No melena, hematochezia, or BRBPR. Stools are brown. Energy level is good. GI ROS is otherwise negative.   Labs 04/29/21: hemoglobin 10.8 Labs 05/13/21: hemoglobin 10.6 Labs 05/18/21: hemoglobin 10.3, ferritin 1262, iron 54, platelets 226  Past Medical History:  Diagnosis Date   Anemia     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 02/08/2017   moderate by echo 10/2017 with mean AVG 19mmHg and dimensionless index 0.31 consistent with moderate AS.   Cataract    right eye - surgery to remove   CHF (congestive heart failure) ([place])    Elevated PSA 11/29/2016   Patient is followed by [facility].  Most recent PSA near 11.  He has had atypia on biopsy.  November 2008   Essential hypertension, benign 06/08/2012   Full dentures    GERD (gastroesophageal reflux disease)    GI bleed    Glaucoma    Heart murmur    since childhood, never has caused any problems   History of rheumatic fever 11/04/2015   Hyperlipidemia 04/25/2013   Hypertension    Malignant neoplasm of left lung ([place]) 02/08/2017   RBBB 07/12/2017   Sickle cell trait ([place])    no problems per patient    Past Surgical History:  Procedure Laterality Date   AV FISTULA PLACEMENT Right 01/25/2019   Procedure: ARTERIOVENOUS (AV) FISTULA CREATION RIGHT ARM;  Surgeon: Rosetta Posner, MD;  Location: [place] OR;  Service: Vascular;  Laterality: Right;   BIOPSY  11/11/2017   Procedure: BIOPSY;  Surgeon: Lavena Bullion, DO;  Location: [place] [facility]COPY;  Service: Gastroenterology;;   BIOPSY  01/10/2018   Procedure: BIOPSY;  Surgeon: [name], MD;  Location: [place];  Service: Gastroenterology;;   BIOPSY  06/22/2018   Procedure: BIOPSY;  Surgeon: Yetta Flock, MD;  Location: [facility];  Service: Gastroenterology;;   CATARACT EXTRACTION Right    cataract eye surgery Right    COLONOSCOPY  11/2009   hx polyps/[place]   COLONOSCOPY N/A 06/22/2018   Procedure: COLONOSCOPY;  Surgeon: Yetta Flock, MD;  Location: [place];  Service: Gastroenterology;  Laterality: N/A;   ENTEROSCOPY N/A 06/22/2018   Procedure: PUSH ENTEROSCOPY;  Surgeon: Yetta Flock, MD;  Location: [facility] ENDOSCOPY;  Service: Gastroenterology;  Laterality: N/A;   ENTEROSCOPY N/A 11/12/2018   Procedure: ENTEROSCOPY;  Surgeon: Milus Banister, MD;  Location[facility] ENDOSCOPY;  Service: Endoscopy;  Laterality: N/A;   ESOPHAGOGASTRODUODENOSCOPY N/A 11/11/2017   Procedure: ESOPHAGOGASTRODUODENOSCOPY (EGD);  Surgeon: Lavena Bullion, DO;  Location: [facility] ENDOSCOPY;  Service: Gastroenterology;  Laterality: N/A;   ESOPHAGOGASTRODUODENOSCOPY (EGD) WITH PROPOFOL N/A 01/10/2018   Procedure: ESOPHAGOGASTRODUODENOSCOPY (EGD) WITH PROPOFOL;  Surgeon: [name], MD;  Location: [place];  Service: Gastroenterology;  Laterality: N/A;   GIVENS CAPSULE STUDY N/A 11/12/2018   Procedure: GIVENS CAPSULE STUDY;  Surgeon: Milus Banister, MD;  Location: [facility] ENDOSCOPY;  Service: Endoscopy;  Laterality: N/A;   HEMOSTASIS CLIP PLACEMENT  11/12/2018   Procedure: HEMOSTASIS CLIP PLACEMENT;  Surgeon: Milus Banister, MD;  Location: [facility] ENDOSCOPY;  Service: Endoscopy;;   HOT HEMOSTASIS N/A 06/22/2018   Procedure: HOT HEMOSTASIS (ARGON PLASMA COAGULATION/BICAP);  Surgeon: Yetta Flock, MD;  Location: [facility] ENDOSCOPY;  Service: Gastroenterology;  Laterality: N/A;   HOT HEMOSTASIS N/A  11/12/2018   Procedure: HOT HEMOSTASIS (ARGON PLASMA COAGULATION/BICAP);  Surgeon: Milus Banister, MD;  Location: [facility] ENDOSCOPY;  Service: Endoscopy;  Laterality: N/A;   IR FLUORO GUIDE CV LINE LEFT  03/15/2019   IR FLUORO GUIDE CV LINE RIGHT  01/16/2018   IR FLUORO GUIDE CV LINE RIGHT  01/31/2018   IR FLUORO GUIDE CV LINE RIGHT  10/06/2018   IR FLUORO GUIDE CV LINE RIGHT  10/11/2018   IR REMOVAL TUN CV CATH W/O FL  05/10/2019   IR US GUIDE VASC ACCESS RIGHT  01/16/2018   IR US GUIDE VASC ACCESS RIGHT  01/31/2018   IR US GUIDE VASC ACCESS RIGHT  10/06/2018   POLYPECTOMY  06/22/2018   Procedure: POLYPECTOMY;  Surgeon: Yetta Flock, MD;  Location: [place];  Service: Gastroenterology;;   PROSTATE BIOPSY     right eye surgery     to lower eye pressure   RIGHT HEART CATH N/A 01/19/2018   Procedure: RIGHT HEART CATH;  Surgeon: Nelva Bush, MD;  Location:  [place] [facility];  Service: Cardiovascular;  Laterality: N/A;   TONSILLECTOMY     VIDEO ASSISTED THORACOSCOPY (VATS)/ LOBECTOMY Right 12/26/2015   wisdom tteeth ext      Current Outpatient Medications  Medication Sig Dispense Refill   carvedilol (COREG) 3.125 MG tablet Take 1 tablet (3.125 mg total) by mouth 2 (two) times daily with a meal. 60 tablet 0   ferric citrate (AURYXIA) 1 GM 210 MG(Fe) tablet Take 210 mg by mouth 5 (five) times daily. Take 1 tablet (210 mg) by mouth with meals and snacks     multivitamin (RENA-VIT) TABS tablet Take 1 tablet by mouth daily.     Nutritional Supplements (FEEDING SUPPLEMENT, NEPRO CARB STEADY,) LIQD Take 237 mLs by mouth 3 (three) times daily.      octreotide (SANDOSTATIN LAR DEPOT) 20 MG injection Inject 20 mg into the muscle every 28 (twenty-eight) days. 1 each 11   pantoprazole (PROTONIX) 40 MG tablet TAKE 1 TABLET BY MOUTH TWICE A DAY 180 tablet 1   sevelamer carbonate (RENVELA) 800 MG tablet Take 800 mg by mouth 5 (five) times daily. Take 1 tablet (800 mg) by mouth with meals and snacks     Current Facility-Administered Medications  Medication Dose Route Frequency Provider Last Rate Last Admin   octreotide (SANDOSTATIN LAR) IM injection 20 mg  20 mg Intramuscular Once [name], MD       octreotide (SANDOSTATIN LAR) IM injection 20 mg  20 mg Intramuscular Once [name], MD       octreotide (SANDOSTATIN LAR) IM injection 20 mg  20 mg Intramuscular Once [name], MD       octreotide (SANDOSTATIN LAR) IM injection 20 mg  20 mg Intramuscular Once [name], MD       octreotide (SANDOSTATIN LAR) IM injection 20 mg  20 mg Intramuscular Once [name], MD       octreotide (SANDOSTATIN LAR) IM injection 20 mg  20 mg Intramuscular Once [name], MD       octreotide (SANDOSTATIN LAR) IM injection 20 mg  20 mg Intramuscular Once [name], MD       octreotide (SANDOSTATIN LAR) IM injection 20 mg  20  mg Intramuscular Once [name], MD       Derrill Memo ON 07/25/2021] octreotide (SANDOSTATIN LAR) IM injection 20 mg  20 mg Intramuscular Once [name], MD       [START ON 08/25/2021] octreotide (SANDOSTATIN LAR) IM  injection 20 mg  20 mg Intramuscular Once [name], MD       [START ON 09/25/2021] octreotide (SANDOSTATIN LAR) IM injection 20 mg  20 mg Intramuscular Once [name], MD        Allergies as of 06/25/2021 - Review Complete 06/25/2021  Allergen Reaction Noted   Ambien [zolpidem tartrate] Other (See Comments) 01/31/2018   Lipitor [atorvastatin] Other (See Comments) 07/31/2017    Family History  Problem Relation Age of Onset   Diabetes Mother    Cerebral aneurysm Mother    Lung cancer Father    Diabetes Sister    Colon cancer Neg Hx    Colon polyps Neg Hx    Esophageal cancer Neg Hx    Rectal cancer Neg Hx    Stomach cancer Neg Hx      Physical Exam: General:   Alert,  well-nourished, pleasant and cooperative in NAD Head:  Normocephalic and atraumatic. Eyes:  Sclera clear, no icterus.   Conjunctiva pink. Abdomen:  Soft,nontender, nondistended, normal bowel sounds, no rebound or guarding. No hepatosplenomegaly.   Neurologic:  Alert and  oriented x4;  grossly nonfocal Skin:  Intact without significant lesions or rashes. Psych:  Alert and cooperative. Normal mood and affect.  I spent 25 minutes, including independent review of results, communicating results with the patient directly, face-to-face time with the patient, coordinating care, ordering studies and medications as appropriate, and documentation.     Maelani Yarbro L. Tarri Glenn, MD, MPH 06/25/2021, 11:11 AM

## 2021-06-25 NOTE — Patient Instructions (Addendum)
It was my pleasure to provide care to you today. Based on our discussion, I am providing you with my recommendations below:  RECOMMENDATION(S):   Refilled Sandostatin.  Continue labs every 3 months.   FOLLOW UP:  I would like for you to follow up with me in 1 year or sooner if needed. Please call the office at [phone] to schedule your appointment.  BMI:  If you are age 83 or older, your body mass index should be between 23-30. Your Body mass index is 20.97 kg/m. If this is out of the aforementioned range listed, please consider follow up with your Primary Care Provider.  If you are age 72 or younger, your body mass index should be between 19-25. Your Body mass index is 20.97 kg/m. If this is out of the aformentioned range listed, please consider follow up with your Primary Care Provider.   MY CHART:  The [facility] providers would like to encourage you to use [facility] to communicate with providers for non-urgent requests or questions.  Due to long hold times on the telephone, sending your provider a message by [facility] may be a faster and more efficient way to get a response.  Please allow 48 business hours for a response.  Please remember that this is for non-urgent requests.   Thank you for trusting me with your gastrointestinal care!    [name], MD, MPH

## 2021-06-26 DIAGNOSIS — N186 End stage renal disease: Secondary | ICD-10-CM | POA: Diagnosis not present

## 2021-06-26 DIAGNOSIS — N2581 Secondary hyperparathyroidism of renal origin: Secondary | ICD-10-CM | POA: Diagnosis not present

## 2021-06-26 DIAGNOSIS — Z992 Dependence on renal dialysis: Secondary | ICD-10-CM | POA: Diagnosis not present

## 2021-06-29 DIAGNOSIS — Z992 Dependence on renal dialysis: Secondary | ICD-10-CM | POA: Diagnosis not present

## 2021-06-29 DIAGNOSIS — N2581 Secondary hyperparathyroidism of renal origin: Secondary | ICD-10-CM | POA: Diagnosis not present

## 2021-06-29 DIAGNOSIS — N186 End stage renal disease: Secondary | ICD-10-CM | POA: Diagnosis not present

## 2021-07-01 DIAGNOSIS — N186 End stage renal disease: Secondary | ICD-10-CM | POA: Diagnosis not present

## 2021-07-01 DIAGNOSIS — Z992 Dependence on renal dialysis: Secondary | ICD-10-CM | POA: Diagnosis not present

## 2021-07-01 DIAGNOSIS — N2581 Secondary hyperparathyroidism of renal origin: Secondary | ICD-10-CM | POA: Diagnosis not present

## 2021-07-02 ENCOUNTER — Encounter (HOSPITAL_COMMUNITY): Payer: Medicare PPO

## 2021-07-03 DIAGNOSIS — N186 End stage renal disease: Secondary | ICD-10-CM | POA: Diagnosis not present

## 2021-07-03 DIAGNOSIS — Z992 Dependence on renal dialysis: Secondary | ICD-10-CM | POA: Diagnosis not present

## 2021-07-03 DIAGNOSIS — N2581 Secondary hyperparathyroidism of renal origin: Secondary | ICD-10-CM | POA: Diagnosis not present

## 2021-07-06 DIAGNOSIS — N2581 Secondary hyperparathyroidism of renal origin: Secondary | ICD-10-CM | POA: Diagnosis not present

## 2021-07-06 DIAGNOSIS — N186 End stage renal disease: Secondary | ICD-10-CM | POA: Diagnosis not present

## 2021-07-06 DIAGNOSIS — Z992 Dependence on renal dialysis: Secondary | ICD-10-CM | POA: Diagnosis not present

## 2021-07-08 DIAGNOSIS — N2581 Secondary hyperparathyroidism of renal origin: Secondary | ICD-10-CM | POA: Diagnosis not present

## 2021-07-08 DIAGNOSIS — N186 End stage renal disease: Secondary | ICD-10-CM | POA: Diagnosis not present

## 2021-07-08 DIAGNOSIS — Z992 Dependence on renal dialysis: Secondary | ICD-10-CM | POA: Diagnosis not present

## 2021-07-09 ENCOUNTER — Non-Acute Institutional Stay (HOSPITAL_COMMUNITY)
Admission: RE | Admit: 2021-07-09 | Discharge: 2021-07-09 | Disposition: A | Discharge status: Home / Self Care | Payer: Medicare PPO | Source: Ambulatory Visit | Attending: Internal Medicine | Admitting: Internal Medicine

## 2021-07-09 DIAGNOSIS — D500 Iron deficiency anemia secondary to blood loss (chronic): Secondary | ICD-10-CM | POA: Diagnosis not present

## 2021-07-09 MED ORDER — OCTREOTIDE ACETATE 20 MG IM KIT
20.0000 mg | PACK | Freq: Once | INTRAMUSCULAR | Status: AC
  Administered 2021-07-09: 20 mg via INTRAMUSCULAR
  Filled 2021-07-09 (×3): qty 1

## 2021-07-09 NOTE — Progress Notes (Addendum)
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 Orders placed on 05/25/21. Patient tolerated well. Vital signs stable.  AVS offered but patient refused.  Patient to come back every 30 days for injections and will make next appointment at the front desk. Patient alert, oriented and ambulatory at discharge.

## 2021-07-10 DIAGNOSIS — N2581 Secondary hyperparathyroidism of renal origin: Secondary | ICD-10-CM | POA: Diagnosis not present

## 2021-07-10 DIAGNOSIS — Z992 Dependence on renal dialysis: Secondary | ICD-10-CM | POA: Diagnosis not present

## 2021-07-10 DIAGNOSIS — N186 End stage renal disease: Secondary | ICD-10-CM | POA: Diagnosis not present

## 2021-07-11 ENCOUNTER — Other Ambulatory Visit: Payer: Self-pay | Admitting: Gastroenterology

## 2021-07-13 DIAGNOSIS — N2581 Secondary hyperparathyroidism of renal origin: Secondary | ICD-10-CM | POA: Diagnosis not present

## 2021-07-13 DIAGNOSIS — Z992 Dependence on renal dialysis: Secondary | ICD-10-CM | POA: Diagnosis not present

## 2021-07-13 DIAGNOSIS — N186 End stage renal disease: Secondary | ICD-10-CM | POA: Diagnosis not present

## 2021-07-15 DIAGNOSIS — N186 End stage renal disease: Secondary | ICD-10-CM | POA: Diagnosis not present

## 2021-07-15 DIAGNOSIS — Z992 Dependence on renal dialysis: Secondary | ICD-10-CM | POA: Diagnosis not present

## 2021-07-15 DIAGNOSIS — N2581 Secondary hyperparathyroidism of renal origin: Secondary | ICD-10-CM | POA: Diagnosis not present

## 2021-07-15 DIAGNOSIS — R0600 Dyspnea, unspecified: Secondary | ICD-10-CM | POA: Diagnosis not present

## 2021-07-15 DIAGNOSIS — C3492 Malignant neoplasm of unspecified part of left bronchus or lung: Secondary | ICD-10-CM | POA: Diagnosis not present

## 2021-07-15 DIAGNOSIS — I5033 Acute on chronic diastolic (congestive) heart failure: Secondary | ICD-10-CM | POA: Diagnosis not present

## 2021-07-17 DIAGNOSIS — N186 End stage renal disease: Secondary | ICD-10-CM | POA: Diagnosis not present

## 2021-07-17 DIAGNOSIS — Z992 Dependence on renal dialysis: Secondary | ICD-10-CM | POA: Diagnosis not present

## 2021-07-17 DIAGNOSIS — N032 Chronic nephritic syndrome with diffuse membranous glomerulonephritis: Secondary | ICD-10-CM | POA: Diagnosis not present

## 2021-07-17 DIAGNOSIS — N2581 Secondary hyperparathyroidism of renal origin: Secondary | ICD-10-CM | POA: Diagnosis not present

## 2021-07-20 DIAGNOSIS — Z992 Dependence on renal dialysis: Secondary | ICD-10-CM | POA: Diagnosis not present

## 2021-07-20 DIAGNOSIS — N186 End stage renal disease: Secondary | ICD-10-CM | POA: Diagnosis not present

## 2021-07-20 DIAGNOSIS — N2581 Secondary hyperparathyroidism of renal origin: Secondary | ICD-10-CM | POA: Diagnosis not present

## 2021-07-22 DIAGNOSIS — Z992 Dependence on renal dialysis: Secondary | ICD-10-CM | POA: Diagnosis not present

## 2021-07-22 DIAGNOSIS — N2581 Secondary hyperparathyroidism of renal origin: Secondary | ICD-10-CM | POA: Diagnosis not present

## 2021-07-22 DIAGNOSIS — N186 End stage renal disease: Secondary | ICD-10-CM | POA: Diagnosis not present

## 2021-07-24 DIAGNOSIS — N2581 Secondary hyperparathyroidism of renal origin: Secondary | ICD-10-CM | POA: Diagnosis not present

## 2021-07-24 DIAGNOSIS — Z992 Dependence on renal dialysis: Secondary | ICD-10-CM | POA: Diagnosis not present

## 2021-07-24 DIAGNOSIS — N186 End stage renal disease: Secondary | ICD-10-CM | POA: Diagnosis not present

## 2021-07-27 DIAGNOSIS — N2581 Secondary hyperparathyroidism of renal origin: Secondary | ICD-10-CM | POA: Diagnosis not present

## 2021-07-27 DIAGNOSIS — Z992 Dependence on renal dialysis: Secondary | ICD-10-CM | POA: Diagnosis not present

## 2021-07-27 DIAGNOSIS — N186 End stage renal disease: Secondary | ICD-10-CM | POA: Diagnosis not present

## 2021-07-29 DIAGNOSIS — Z992 Dependence on renal dialysis: Secondary | ICD-10-CM | POA: Diagnosis not present

## 2021-07-29 DIAGNOSIS — N186 End stage renal disease: Secondary | ICD-10-CM | POA: Diagnosis not present

## 2021-07-29 DIAGNOSIS — N2581 Secondary hyperparathyroidism of renal origin: Secondary | ICD-10-CM | POA: Diagnosis not present

## 2021-07-31 DIAGNOSIS — N186 End stage renal disease: Secondary | ICD-10-CM | POA: Diagnosis not present

## 2021-07-31 DIAGNOSIS — Z992 Dependence on renal dialysis: Secondary | ICD-10-CM | POA: Diagnosis not present

## 2021-07-31 DIAGNOSIS — N2581 Secondary hyperparathyroidism of renal origin: Secondary | ICD-10-CM | POA: Diagnosis not present

## 2021-08-03 DIAGNOSIS — N2581 Secondary hyperparathyroidism of renal origin: Secondary | ICD-10-CM | POA: Diagnosis not present

## 2021-08-03 DIAGNOSIS — N186 End stage renal disease: Secondary | ICD-10-CM | POA: Diagnosis not present

## 2021-08-03 DIAGNOSIS — Z992 Dependence on renal dialysis: Secondary | ICD-10-CM | POA: Diagnosis not present

## 2021-08-05 DIAGNOSIS — N186 End stage renal disease: Secondary | ICD-10-CM | POA: Diagnosis not present

## 2021-08-05 DIAGNOSIS — N2581 Secondary hyperparathyroidism of renal origin: Secondary | ICD-10-CM | POA: Diagnosis not present

## 2021-08-05 DIAGNOSIS — Z992 Dependence on renal dialysis: Secondary | ICD-10-CM | POA: Diagnosis not present

## 2021-08-06 ENCOUNTER — Non-Acute Institutional Stay (HOSPITAL_COMMUNITY)
Admission: RE | Admit: 2021-08-06 | Discharge: 2021-08-06 | Disposition: A | Discharge status: Home / Self Care | Payer: Medicare PPO | Source: Ambulatory Visit | Attending: Internal Medicine | Admitting: Internal Medicine

## 2021-08-06 DIAGNOSIS — D500 Iron deficiency anemia secondary to blood loss (chronic): Secondary | ICD-10-CM | POA: Insufficient documentation

## 2021-08-06 MED ORDER — OCTREOTIDE ACETATE 20 MG IM KIT
20.0000 mg | PACK | Freq: Once | INTRAMUSCULAR | Status: AC
  Administered 2021-08-06: 20 mg via INTRAMUSCULAR
  Filled 2021-08-06: qty 1

## 2021-08-06 NOTE — Progress Notes (Signed)
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 Orders placed on 05/25/21. Patient tolerated well. Vital signs stable.  AVS offered but patient refused.  Patient to come back every 30 days for injections and will make next appointment at the front desk. Patient alert, oriented and ambulatory at discharge.

## 2021-08-07 DIAGNOSIS — Z992 Dependence on renal dialysis: Secondary | ICD-10-CM | POA: Diagnosis not present

## 2021-08-07 DIAGNOSIS — N186 End stage renal disease: Secondary | ICD-10-CM | POA: Diagnosis not present

## 2021-08-07 DIAGNOSIS — N2581 Secondary hyperparathyroidism of renal origin: Secondary | ICD-10-CM | POA: Diagnosis not present

## 2021-08-10 DIAGNOSIS — N2581 Secondary hyperparathyroidism of renal origin: Secondary | ICD-10-CM | POA: Diagnosis not present

## 2021-08-10 DIAGNOSIS — N186 End stage renal disease: Secondary | ICD-10-CM | POA: Diagnosis not present

## 2021-08-10 DIAGNOSIS — Z992 Dependence on renal dialysis: Secondary | ICD-10-CM | POA: Diagnosis not present

## 2021-08-12 DIAGNOSIS — N2581 Secondary hyperparathyroidism of renal origin: Secondary | ICD-10-CM | POA: Diagnosis not present

## 2021-08-12 DIAGNOSIS — Z992 Dependence on renal dialysis: Secondary | ICD-10-CM | POA: Diagnosis not present

## 2021-08-12 DIAGNOSIS — N186 End stage renal disease: Secondary | ICD-10-CM | POA: Diagnosis not present

## 2021-08-14 DIAGNOSIS — I5033 Acute on chronic diastolic (congestive) heart failure: Secondary | ICD-10-CM | POA: Diagnosis not present

## 2021-08-14 DIAGNOSIS — R0600 Dyspnea, unspecified: Secondary | ICD-10-CM | POA: Diagnosis not present

## 2021-08-14 DIAGNOSIS — N186 End stage renal disease: Secondary | ICD-10-CM | POA: Diagnosis not present

## 2021-08-14 DIAGNOSIS — C3492 Malignant neoplasm of unspecified part of left bronchus or lung: Secondary | ICD-10-CM | POA: Diagnosis not present

## 2021-08-14 DIAGNOSIS — Z992 Dependence on renal dialysis: Secondary | ICD-10-CM | POA: Diagnosis not present

## 2021-08-14 DIAGNOSIS — N2581 Secondary hyperparathyroidism of renal origin: Secondary | ICD-10-CM | POA: Diagnosis not present

## 2021-08-17 DIAGNOSIS — N186 End stage renal disease: Secondary | ICD-10-CM | POA: Diagnosis not present

## 2021-08-17 DIAGNOSIS — N032 Chronic nephritic syndrome with diffuse membranous glomerulonephritis: Secondary | ICD-10-CM | POA: Diagnosis not present

## 2021-08-17 DIAGNOSIS — N2581 Secondary hyperparathyroidism of renal origin: Secondary | ICD-10-CM | POA: Diagnosis not present

## 2021-08-17 DIAGNOSIS — Z992 Dependence on renal dialysis: Secondary | ICD-10-CM | POA: Diagnosis not present

## 2021-08-19 DIAGNOSIS — Z992 Dependence on renal dialysis: Secondary | ICD-10-CM | POA: Diagnosis not present

## 2021-08-19 DIAGNOSIS — N2581 Secondary hyperparathyroidism of renal origin: Secondary | ICD-10-CM | POA: Diagnosis not present

## 2021-08-19 DIAGNOSIS — N186 End stage renal disease: Secondary | ICD-10-CM | POA: Diagnosis not present

## 2021-08-21 DIAGNOSIS — N186 End stage renal disease: Secondary | ICD-10-CM | POA: Diagnosis not present

## 2021-08-21 DIAGNOSIS — Z992 Dependence on renal dialysis: Secondary | ICD-10-CM | POA: Diagnosis not present

## 2021-08-21 DIAGNOSIS — N2581 Secondary hyperparathyroidism of renal origin: Secondary | ICD-10-CM | POA: Diagnosis not present

## 2021-08-24 DIAGNOSIS — N2581 Secondary hyperparathyroidism of renal origin: Secondary | ICD-10-CM | POA: Diagnosis not present

## 2021-08-24 DIAGNOSIS — N186 End stage renal disease: Secondary | ICD-10-CM | POA: Diagnosis not present

## 2021-08-24 DIAGNOSIS — Z992 Dependence on renal dialysis: Secondary | ICD-10-CM | POA: Diagnosis not present

## 2021-08-26 DIAGNOSIS — Z992 Dependence on renal dialysis: Secondary | ICD-10-CM | POA: Diagnosis not present

## 2021-08-26 DIAGNOSIS — N186 End stage renal disease: Secondary | ICD-10-CM | POA: Diagnosis not present

## 2021-08-26 DIAGNOSIS — N2581 Secondary hyperparathyroidism of renal origin: Secondary | ICD-10-CM | POA: Diagnosis not present

## 2021-08-29 DIAGNOSIS — N2581 Secondary hyperparathyroidism of renal origin: Secondary | ICD-10-CM | POA: Diagnosis not present

## 2021-08-29 DIAGNOSIS — Z992 Dependence on renal dialysis: Secondary | ICD-10-CM | POA: Diagnosis not present

## 2021-08-29 DIAGNOSIS — N186 End stage renal disease: Secondary | ICD-10-CM | POA: Diagnosis not present

## 2021-08-31 DIAGNOSIS — N2581 Secondary hyperparathyroidism of renal origin: Secondary | ICD-10-CM | POA: Diagnosis not present

## 2021-08-31 DIAGNOSIS — N186 End stage renal disease: Secondary | ICD-10-CM | POA: Diagnosis not present

## 2021-08-31 DIAGNOSIS — Z992 Dependence on renal dialysis: Secondary | ICD-10-CM | POA: Diagnosis not present

## 2021-09-02 DIAGNOSIS — N186 End stage renal disease: Secondary | ICD-10-CM | POA: Diagnosis not present

## 2021-09-02 DIAGNOSIS — N2581 Secondary hyperparathyroidism of renal origin: Secondary | ICD-10-CM | POA: Diagnosis not present

## 2021-09-02 DIAGNOSIS — Z992 Dependence on renal dialysis: Secondary | ICD-10-CM | POA: Diagnosis not present

## 2021-09-03 ENCOUNTER — Non-Acute Institutional Stay (HOSPITAL_COMMUNITY)
Admission: RE | Admit: 2021-09-03 | Discharge: 2021-09-03 | Disposition: A | Discharge status: Home / Self Care | Payer: Medicare PPO | Source: Ambulatory Visit | Attending: Internal Medicine | Admitting: Internal Medicine

## 2021-09-03 DIAGNOSIS — D500 Iron deficiency anemia secondary to blood loss (chronic): Secondary | ICD-10-CM | POA: Diagnosis not present

## 2021-09-03 DIAGNOSIS — K5520 Angiodysplasia of colon without hemorrhage: Secondary | ICD-10-CM | POA: Diagnosis not present

## 2021-09-03 MED ORDER — OCTREOTIDE ACETATE 20 MG IM KIT
20.0000 mg | PACK | Freq: Once | INTRAMUSCULAR | Status: AC
  Administered 2021-09-03: 20 mg via INTRAMUSCULAR
  Filled 2021-09-03: qty 1

## 2021-09-03 NOTE — Progress Notes (Signed)
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 Orders placed on 05/25/21. Patient tolerated well. Vital signs stable.  AVS offered but patient refused.  Patient to come back every 30 days for injections and will make next appointment at the front desk. Patient alert, oriented and ambulatory at discharge.

## 2021-09-04 DIAGNOSIS — N186 End stage renal disease: Secondary | ICD-10-CM | POA: Diagnosis not present

## 2021-09-04 DIAGNOSIS — Z992 Dependence on renal dialysis: Secondary | ICD-10-CM | POA: Diagnosis not present

## 2021-09-04 DIAGNOSIS — N2581 Secondary hyperparathyroidism of renal origin: Secondary | ICD-10-CM | POA: Diagnosis not present

## 2021-09-07 DIAGNOSIS — N2581 Secondary hyperparathyroidism of renal origin: Secondary | ICD-10-CM | POA: Diagnosis not present

## 2021-09-07 DIAGNOSIS — N186 End stage renal disease: Secondary | ICD-10-CM | POA: Diagnosis not present

## 2021-09-07 DIAGNOSIS — Z992 Dependence on renal dialysis: Secondary | ICD-10-CM | POA: Diagnosis not present

## 2021-09-09 DIAGNOSIS — Z992 Dependence on renal dialysis: Secondary | ICD-10-CM | POA: Diagnosis not present

## 2021-09-09 DIAGNOSIS — N2581 Secondary hyperparathyroidism of renal origin: Secondary | ICD-10-CM | POA: Diagnosis not present

## 2021-09-09 DIAGNOSIS — N186 End stage renal disease: Secondary | ICD-10-CM | POA: Diagnosis not present

## 2021-09-11 ENCOUNTER — Other Ambulatory Visit: Payer: Self-pay

## 2021-09-11 DIAGNOSIS — D500 Iron deficiency anemia secondary to blood loss (chronic): Secondary | ICD-10-CM

## 2021-09-11 DIAGNOSIS — K5520 Angiodysplasia of colon without hemorrhage: Secondary | ICD-10-CM

## 2021-09-11 DIAGNOSIS — Z992 Dependence on renal dialysis: Secondary | ICD-10-CM | POA: Diagnosis not present

## 2021-09-11 DIAGNOSIS — N186 End stage renal disease: Secondary | ICD-10-CM | POA: Diagnosis not present

## 2021-09-11 DIAGNOSIS — N2581 Secondary hyperparathyroidism of renal origin: Secondary | ICD-10-CM | POA: Diagnosis not present

## 2021-09-14 DIAGNOSIS — Z992 Dependence on renal dialysis: Secondary | ICD-10-CM | POA: Diagnosis not present

## 2021-09-14 DIAGNOSIS — N2581 Secondary hyperparathyroidism of renal origin: Secondary | ICD-10-CM | POA: Diagnosis not present

## 2021-09-14 DIAGNOSIS — I5033 Acute on chronic diastolic (congestive) heart failure: Secondary | ICD-10-CM | POA: Diagnosis not present

## 2021-09-14 DIAGNOSIS — R0600 Dyspnea, unspecified: Secondary | ICD-10-CM | POA: Diagnosis not present

## 2021-09-14 DIAGNOSIS — N186 End stage renal disease: Secondary | ICD-10-CM | POA: Diagnosis not present

## 2021-09-14 DIAGNOSIS — C3492 Malignant neoplasm of unspecified part of left bronchus or lung: Secondary | ICD-10-CM | POA: Diagnosis not present

## 2021-09-16 DIAGNOSIS — N186 End stage renal disease: Secondary | ICD-10-CM | POA: Diagnosis not present

## 2021-09-16 DIAGNOSIS — N2581 Secondary hyperparathyroidism of renal origin: Secondary | ICD-10-CM | POA: Diagnosis not present

## 2021-09-16 DIAGNOSIS — Z992 Dependence on renal dialysis: Secondary | ICD-10-CM | POA: Diagnosis not present

## 2021-09-17 DIAGNOSIS — N032 Chronic nephritic syndrome with diffuse membranous glomerulonephritis: Secondary | ICD-10-CM | POA: Diagnosis not present

## 2021-09-17 DIAGNOSIS — Z992 Dependence on renal dialysis: Secondary | ICD-10-CM | POA: Diagnosis not present

## 2021-09-17 DIAGNOSIS — N186 End stage renal disease: Secondary | ICD-10-CM | POA: Diagnosis not present

## 2021-09-18 DIAGNOSIS — Z992 Dependence on renal dialysis: Secondary | ICD-10-CM | POA: Diagnosis not present

## 2021-09-18 DIAGNOSIS — N2581 Secondary hyperparathyroidism of renal origin: Secondary | ICD-10-CM | POA: Diagnosis not present

## 2021-09-18 DIAGNOSIS — N186 End stage renal disease: Secondary | ICD-10-CM | POA: Diagnosis not present

## 2021-09-21 DIAGNOSIS — Z992 Dependence on renal dialysis: Secondary | ICD-10-CM | POA: Diagnosis not present

## 2021-09-21 DIAGNOSIS — N186 End stage renal disease: Secondary | ICD-10-CM | POA: Diagnosis not present

## 2021-09-21 DIAGNOSIS — N2581 Secondary hyperparathyroidism of renal origin: Secondary | ICD-10-CM | POA: Diagnosis not present

## 2021-09-23 DIAGNOSIS — N2581 Secondary hyperparathyroidism of renal origin: Secondary | ICD-10-CM | POA: Diagnosis not present

## 2021-09-23 DIAGNOSIS — Z992 Dependence on renal dialysis: Secondary | ICD-10-CM | POA: Diagnosis not present

## 2021-09-23 DIAGNOSIS — N186 End stage renal disease: Secondary | ICD-10-CM | POA: Diagnosis not present

## 2021-09-25 DIAGNOSIS — N186 End stage renal disease: Secondary | ICD-10-CM | POA: Diagnosis not present

## 2021-09-25 DIAGNOSIS — N2581 Secondary hyperparathyroidism of renal origin: Secondary | ICD-10-CM | POA: Diagnosis not present

## 2021-09-25 DIAGNOSIS — Z992 Dependence on renal dialysis: Secondary | ICD-10-CM | POA: Diagnosis not present

## 2021-09-28 DIAGNOSIS — Z992 Dependence on renal dialysis: Secondary | ICD-10-CM | POA: Diagnosis not present

## 2021-09-28 DIAGNOSIS — N186 End stage renal disease: Secondary | ICD-10-CM | POA: Diagnosis not present

## 2021-09-28 DIAGNOSIS — N2581 Secondary hyperparathyroidism of renal origin: Secondary | ICD-10-CM | POA: Diagnosis not present

## 2021-09-30 ENCOUNTER — Telehealth: Payer: Self-pay

## 2021-09-30 DIAGNOSIS — N2581 Secondary hyperparathyroidism of renal origin: Secondary | ICD-10-CM | POA: Diagnosis not present

## 2021-09-30 DIAGNOSIS — N186 End stage renal disease: Secondary | ICD-10-CM | POA: Diagnosis not present

## 2021-09-30 DIAGNOSIS — Z992 Dependence on renal dialysis: Secondary | ICD-10-CM | POA: Diagnosis not present

## 2021-09-30 NOTE — Telephone Encounter (Signed)
Per Andrew Guerra with [employer], fax was received & they are going to fax over most recent copy of labs.

## 2021-10-01 ENCOUNTER — Other Ambulatory Visit: Payer: Self-pay

## 2021-10-01 ENCOUNTER — Non-Acute Institutional Stay (HOSPITAL_COMMUNITY)
Admission: RE | Admit: 2021-10-01 | Discharge: 2021-10-01 | Disposition: A | Discharge status: Home / Self Care | Payer: Medicare PPO | Source: Ambulatory Visit | Attending: Internal Medicine | Admitting: Internal Medicine

## 2021-10-01 DIAGNOSIS — D500 Iron deficiency anemia secondary to blood loss (chronic): Secondary | ICD-10-CM

## 2021-10-01 DIAGNOSIS — K5520 Angiodysplasia of colon without hemorrhage: Secondary | ICD-10-CM

## 2021-10-01 MED ORDER — OCTREOTIDE ACETATE 20 MG IM KIT
20.0000 mg | PACK | Freq: Once | INTRAMUSCULAR | Status: AC
Start: 2021-10-01 — End: 2021-10-01
  Administered 2021-10-01: 20 mg via INTRAMUSCULAR
  Filled 2021-10-01: qty 1

## 2021-10-01 MED ORDER — OCTREOTIDE ACETATE 20 MG IM KIT: 20.0000 mg | PACK | INTRAMUSCULAR | Status: DC

## 2021-10-01 NOTE — Progress Notes (Signed)
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 Orders placed on 05/25/21. Patient tolerated well. Vital signs stable.  AVS offered but patient refused.  Patient to come back every 30 days for injections and will make next appointment at the front desk. Patient alert, oriented and ambulatory at discharge.

## 2021-10-02 DIAGNOSIS — N2581 Secondary hyperparathyroidism of renal origin: Secondary | ICD-10-CM | POA: Diagnosis not present

## 2021-10-02 DIAGNOSIS — N186 End stage renal disease: Secondary | ICD-10-CM | POA: Diagnosis not present

## 2021-10-02 DIAGNOSIS — Z992 Dependence on renal dialysis: Secondary | ICD-10-CM | POA: Diagnosis not present

## 2021-10-05 DIAGNOSIS — Z992 Dependence on renal dialysis: Secondary | ICD-10-CM | POA: Diagnosis not present

## 2021-10-05 DIAGNOSIS — N186 End stage renal disease: Secondary | ICD-10-CM | POA: Diagnosis not present

## 2021-10-05 DIAGNOSIS — N2581 Secondary hyperparathyroidism of renal origin: Secondary | ICD-10-CM | POA: Diagnosis not present

## 2021-10-07 DIAGNOSIS — N186 End stage renal disease: Secondary | ICD-10-CM | POA: Diagnosis not present

## 2021-10-07 DIAGNOSIS — Z992 Dependence on renal dialysis: Secondary | ICD-10-CM | POA: Diagnosis not present

## 2021-10-07 DIAGNOSIS — N2581 Secondary hyperparathyroidism of renal origin: Secondary | ICD-10-CM | POA: Diagnosis not present

## 2021-10-09 DIAGNOSIS — N2581 Secondary hyperparathyroidism of renal origin: Secondary | ICD-10-CM | POA: Diagnosis not present

## 2021-10-09 DIAGNOSIS — Z992 Dependence on renal dialysis: Secondary | ICD-10-CM | POA: Diagnosis not present

## 2021-10-09 DIAGNOSIS — N186 End stage renal disease: Secondary | ICD-10-CM | POA: Diagnosis not present

## 2021-10-12 DIAGNOSIS — Z992 Dependence on renal dialysis: Secondary | ICD-10-CM | POA: Diagnosis not present

## 2021-10-12 DIAGNOSIS — N186 End stage renal disease: Secondary | ICD-10-CM | POA: Diagnosis not present

## 2021-10-12 DIAGNOSIS — N2581 Secondary hyperparathyroidism of renal origin: Secondary | ICD-10-CM | POA: Diagnosis not present

## 2021-10-14 DIAGNOSIS — Z992 Dependence on renal dialysis: Secondary | ICD-10-CM | POA: Diagnosis not present

## 2021-10-14 DIAGNOSIS — N2581 Secondary hyperparathyroidism of renal origin: Secondary | ICD-10-CM | POA: Diagnosis not present

## 2021-10-14 DIAGNOSIS — N186 End stage renal disease: Secondary | ICD-10-CM | POA: Diagnosis not present

## 2021-10-15 DIAGNOSIS — C3492 Malignant neoplasm of unspecified part of left bronchus or lung: Secondary | ICD-10-CM | POA: Diagnosis not present

## 2021-10-15 DIAGNOSIS — I5033 Acute on chronic diastolic (congestive) heart failure: Secondary | ICD-10-CM | POA: Diagnosis not present

## 2021-10-15 DIAGNOSIS — R0600 Dyspnea, unspecified: Secondary | ICD-10-CM | POA: Diagnosis not present

## 2021-10-16 DIAGNOSIS — N186 End stage renal disease: Secondary | ICD-10-CM | POA: Diagnosis not present

## 2021-10-16 DIAGNOSIS — Z992 Dependence on renal dialysis: Secondary | ICD-10-CM | POA: Diagnosis not present

## 2021-10-16 DIAGNOSIS — N2581 Secondary hyperparathyroidism of renal origin: Secondary | ICD-10-CM | POA: Diagnosis not present

## 2021-10-17 DIAGNOSIS — N032 Chronic nephritic syndrome with diffuse membranous glomerulonephritis: Secondary | ICD-10-CM | POA: Diagnosis not present

## 2021-10-17 DIAGNOSIS — N186 End stage renal disease: Secondary | ICD-10-CM | POA: Diagnosis not present

## 2021-10-17 DIAGNOSIS — Z992 Dependence on renal dialysis: Secondary | ICD-10-CM | POA: Diagnosis not present

## 2021-10-19 DIAGNOSIS — N186 End stage renal disease: Secondary | ICD-10-CM | POA: Diagnosis not present

## 2021-10-19 DIAGNOSIS — Z992 Dependence on renal dialysis: Secondary | ICD-10-CM | POA: Diagnosis not present

## 2021-10-19 DIAGNOSIS — N2581 Secondary hyperparathyroidism of renal origin: Secondary | ICD-10-CM | POA: Diagnosis not present

## 2021-10-21 DIAGNOSIS — N186 End stage renal disease: Secondary | ICD-10-CM | POA: Diagnosis not present

## 2021-10-21 DIAGNOSIS — N2581 Secondary hyperparathyroidism of renal origin: Secondary | ICD-10-CM | POA: Diagnosis not present

## 2021-10-21 DIAGNOSIS — Z992 Dependence on renal dialysis: Secondary | ICD-10-CM | POA: Diagnosis not present

## 2021-10-23 DIAGNOSIS — Z992 Dependence on renal dialysis: Secondary | ICD-10-CM | POA: Diagnosis not present

## 2021-10-23 DIAGNOSIS — N186 End stage renal disease: Secondary | ICD-10-CM | POA: Diagnosis not present

## 2021-10-23 DIAGNOSIS — N2581 Secondary hyperparathyroidism of renal origin: Secondary | ICD-10-CM | POA: Diagnosis not present

## 2021-10-26 DIAGNOSIS — N186 End stage renal disease: Secondary | ICD-10-CM | POA: Diagnosis not present

## 2021-10-26 DIAGNOSIS — N2581 Secondary hyperparathyroidism of renal origin: Secondary | ICD-10-CM | POA: Diagnosis not present

## 2021-10-26 DIAGNOSIS — Z992 Dependence on renal dialysis: Secondary | ICD-10-CM | POA: Diagnosis not present

## 2021-10-28 DIAGNOSIS — Z992 Dependence on renal dialysis: Secondary | ICD-10-CM | POA: Diagnosis not present

## 2021-10-28 DIAGNOSIS — N2581 Secondary hyperparathyroidism of renal origin: Secondary | ICD-10-CM | POA: Diagnosis not present

## 2021-10-28 DIAGNOSIS — N186 End stage renal disease: Secondary | ICD-10-CM | POA: Diagnosis not present

## 2021-10-29 ENCOUNTER — Non-Acute Institutional Stay (HOSPITAL_COMMUNITY)
Admission: RE | Admit: 2021-10-29 | Discharge: 2021-10-29 | Disposition: A | Discharge status: Home / Self Care | Payer: Medicare PPO | Source: Ambulatory Visit | Attending: Internal Medicine | Admitting: Internal Medicine

## 2021-10-29 DIAGNOSIS — K5520 Angiodysplasia of colon without hemorrhage: Secondary | ICD-10-CM | POA: Diagnosis not present

## 2021-10-29 DIAGNOSIS — D500 Iron deficiency anemia secondary to blood loss (chronic): Secondary | ICD-10-CM | POA: Diagnosis not present

## 2021-10-29 MED ORDER — OCTREOTIDE ACETATE 20 MG IM KIT
20.0000 mg | PACK | INTRAMUSCULAR | Status: DC
  Administered 2021-10-29: 20 mg via INTRAMUSCULAR
  Filled 2021-10-29: qty 1

## 2021-10-29 NOTE — Progress Notes (Signed)
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  Patient tolerated well. Vital signs wnl for patient.  AVS offered but patient refused.  Patient to come back every 30 days for injections and will make next appointment at the front desk. Patient alert, oriented and ambulatory at discharge.

## 2021-10-30 DIAGNOSIS — N2581 Secondary hyperparathyroidism of renal origin: Secondary | ICD-10-CM | POA: Diagnosis not present

## 2021-10-30 DIAGNOSIS — N186 End stage renal disease: Secondary | ICD-10-CM | POA: Diagnosis not present

## 2021-10-30 DIAGNOSIS — Z992 Dependence on renal dialysis: Secondary | ICD-10-CM | POA: Diagnosis not present

## 2021-11-02 DIAGNOSIS — N186 End stage renal disease: Secondary | ICD-10-CM | POA: Diagnosis not present

## 2021-11-02 DIAGNOSIS — N2581 Secondary hyperparathyroidism of renal origin: Secondary | ICD-10-CM | POA: Diagnosis not present

## 2021-11-02 DIAGNOSIS — Z992 Dependence on renal dialysis: Secondary | ICD-10-CM | POA: Diagnosis not present

## 2021-11-04 DIAGNOSIS — Z992 Dependence on renal dialysis: Secondary | ICD-10-CM | POA: Diagnosis not present

## 2021-11-04 DIAGNOSIS — N186 End stage renal disease: Secondary | ICD-10-CM | POA: Diagnosis not present

## 2021-11-04 DIAGNOSIS — N2581 Secondary hyperparathyroidism of renal origin: Secondary | ICD-10-CM | POA: Diagnosis not present

## 2021-11-06 DIAGNOSIS — N2581 Secondary hyperparathyroidism of renal origin: Secondary | ICD-10-CM | POA: Diagnosis not present

## 2021-11-06 DIAGNOSIS — Z992 Dependence on renal dialysis: Secondary | ICD-10-CM | POA: Diagnosis not present

## 2021-11-06 DIAGNOSIS — N186 End stage renal disease: Secondary | ICD-10-CM | POA: Diagnosis not present

## 2021-11-09 DIAGNOSIS — N2581 Secondary hyperparathyroidism of renal origin: Secondary | ICD-10-CM | POA: Diagnosis not present

## 2021-11-09 DIAGNOSIS — N186 End stage renal disease: Secondary | ICD-10-CM | POA: Diagnosis not present

## 2021-11-09 DIAGNOSIS — Z992 Dependence on renal dialysis: Secondary | ICD-10-CM | POA: Diagnosis not present

## 2021-11-11 DIAGNOSIS — Z992 Dependence on renal dialysis: Secondary | ICD-10-CM | POA: Diagnosis not present

## 2021-11-11 DIAGNOSIS — N186 End stage renal disease: Secondary | ICD-10-CM | POA: Diagnosis not present

## 2021-11-11 DIAGNOSIS — N2581 Secondary hyperparathyroidism of renal origin: Secondary | ICD-10-CM | POA: Diagnosis not present

## 2021-11-13 DIAGNOSIS — N2581 Secondary hyperparathyroidism of renal origin: Secondary | ICD-10-CM | POA: Diagnosis not present

## 2021-11-13 DIAGNOSIS — N186 End stage renal disease: Secondary | ICD-10-CM | POA: Diagnosis not present

## 2021-11-13 DIAGNOSIS — Z992 Dependence on renal dialysis: Secondary | ICD-10-CM | POA: Diagnosis not present

## 2021-11-14 DIAGNOSIS — I5033 Acute on chronic diastolic (congestive) heart failure: Secondary | ICD-10-CM | POA: Diagnosis not present

## 2021-11-14 DIAGNOSIS — C3492 Malignant neoplasm of unspecified part of left bronchus or lung: Secondary | ICD-10-CM | POA: Diagnosis not present

## 2021-11-14 DIAGNOSIS — R0600 Dyspnea, unspecified: Secondary | ICD-10-CM | POA: Diagnosis not present

## 2021-11-16 DIAGNOSIS — N2581 Secondary hyperparathyroidism of renal origin: Secondary | ICD-10-CM | POA: Diagnosis not present

## 2021-11-16 DIAGNOSIS — Z992 Dependence on renal dialysis: Secondary | ICD-10-CM | POA: Diagnosis not present

## 2021-11-16 DIAGNOSIS — N186 End stage renal disease: Secondary | ICD-10-CM | POA: Diagnosis not present

## 2021-11-17 DIAGNOSIS — Z992 Dependence on renal dialysis: Secondary | ICD-10-CM | POA: Diagnosis not present

## 2021-11-17 DIAGNOSIS — N032 Chronic nephritic syndrome with diffuse membranous glomerulonephritis: Secondary | ICD-10-CM | POA: Diagnosis not present

## 2021-11-17 DIAGNOSIS — N186 End stage renal disease: Secondary | ICD-10-CM | POA: Diagnosis not present

## 2021-11-18 DIAGNOSIS — Z992 Dependence on renal dialysis: Secondary | ICD-10-CM | POA: Diagnosis not present

## 2021-11-18 DIAGNOSIS — N186 End stage renal disease: Secondary | ICD-10-CM | POA: Diagnosis not present

## 2021-11-18 DIAGNOSIS — N2581 Secondary hyperparathyroidism of renal origin: Secondary | ICD-10-CM | POA: Diagnosis not present

## 2021-11-20 DIAGNOSIS — N2581 Secondary hyperparathyroidism of renal origin: Secondary | ICD-10-CM | POA: Diagnosis not present

## 2021-11-20 DIAGNOSIS — Z992 Dependence on renal dialysis: Secondary | ICD-10-CM | POA: Diagnosis not present

## 2021-11-20 DIAGNOSIS — N186 End stage renal disease: Secondary | ICD-10-CM | POA: Diagnosis not present

## 2021-11-23 DIAGNOSIS — N186 End stage renal disease: Secondary | ICD-10-CM | POA: Diagnosis not present

## 2021-11-23 DIAGNOSIS — N2581 Secondary hyperparathyroidism of renal origin: Secondary | ICD-10-CM | POA: Diagnosis not present

## 2021-11-23 DIAGNOSIS — Z992 Dependence on renal dialysis: Secondary | ICD-10-CM | POA: Diagnosis not present

## 2021-11-25 DIAGNOSIS — Z992 Dependence on renal dialysis: Secondary | ICD-10-CM | POA: Diagnosis not present

## 2021-11-25 DIAGNOSIS — N186 End stage renal disease: Secondary | ICD-10-CM | POA: Diagnosis not present

## 2021-11-25 DIAGNOSIS — N2581 Secondary hyperparathyroidism of renal origin: Secondary | ICD-10-CM | POA: Diagnosis not present

## 2021-11-26 ENCOUNTER — Non-Acute Institutional Stay (HOSPITAL_COMMUNITY)
Admission: RE | Admit: 2021-11-26 | Discharge: 2021-11-26 | Disposition: A | Discharge status: Home / Self Care | Payer: Medicare PPO | Source: Ambulatory Visit | Attending: Internal Medicine | Admitting: Internal Medicine

## 2021-11-26 ENCOUNTER — Encounter (HOSPITAL_COMMUNITY): Payer: Medicare PPO

## 2021-11-26 ENCOUNTER — Telehealth: Payer: Self-pay | Admitting: Gastroenterology

## 2021-11-26 ENCOUNTER — Telehealth: Payer: Self-pay | Admitting: Family Medicine

## 2021-11-26 DIAGNOSIS — K5520 Angiodysplasia of colon without hemorrhage: Secondary | ICD-10-CM | POA: Diagnosis not present

## 2021-11-26 DIAGNOSIS — D500 Iron deficiency anemia secondary to blood loss (chronic): Secondary | ICD-10-CM | POA: Diagnosis not present

## 2021-11-26 MED ORDER — OCTREOTIDE ACETATE 20 MG IM KIT
20.0000 mg | PACK | Freq: Once | INTRAMUSCULAR | Status: AC
  Administered 2021-11-26: 20 mg via INTRAMUSCULAR
  Filled 2021-11-26: qty 1

## 2021-11-26 NOTE — Telephone Encounter (Signed)
Pt wife called in and states they are moving and would like recommendations for PCP in [facility]. Please advise. Thank you

## 2021-11-26 NOTE — Progress Notes (Signed)
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  Patient tolerated well. Vital signs wnl for patient.  AVS offered but patient refused.  Patient to come back every 30 days for injections and will make next appointment at the front desk. Patient alert, oriented and ambulatory at discharge.

## 2021-11-26 NOTE — Telephone Encounter (Signed)
Patient's wife called stating her and her husband are moving to [place], [place] and are looking to see if they can get a referral sent over. Please advise

## 2021-11-26 NOTE — Telephone Encounter (Signed)
Spoke with patient's wife & she stated they would be moving to [place] this month. Advised her that once they have moved & patient has established care with a PCP, then their office can send in a referral and we can fax records to their office. She verbalized all understanding.

## 2021-11-27 DIAGNOSIS — N2581 Secondary hyperparathyroidism of renal origin: Secondary | ICD-10-CM | POA: Diagnosis not present

## 2021-11-27 DIAGNOSIS — Z992 Dependence on renal dialysis: Secondary | ICD-10-CM | POA: Diagnosis not present

## 2021-11-27 DIAGNOSIS — N186 End stage renal disease: Secondary | ICD-10-CM | POA: Diagnosis not present

## 2021-11-30 DIAGNOSIS — N186 End stage renal disease: Secondary | ICD-10-CM | POA: Diagnosis not present

## 2021-11-30 DIAGNOSIS — N2581 Secondary hyperparathyroidism of renal origin: Secondary | ICD-10-CM | POA: Diagnosis not present

## 2021-11-30 DIAGNOSIS — Z992 Dependence on renal dialysis: Secondary | ICD-10-CM | POA: Diagnosis not present

## 2021-12-02 DIAGNOSIS — N186 End stage renal disease: Secondary | ICD-10-CM | POA: Diagnosis not present

## 2021-12-02 DIAGNOSIS — Z992 Dependence on renal dialysis: Secondary | ICD-10-CM | POA: Diagnosis not present

## 2021-12-02 DIAGNOSIS — N2581 Secondary hyperparathyroidism of renal origin: Secondary | ICD-10-CM | POA: Diagnosis not present

## 2021-12-02 NOTE — Telephone Encounter (Signed)
Pt wife Benjamine Mola contacted and verbalized understanding. They will be moving on 12/16/21

## 2021-12-02 NOTE — Telephone Encounter (Signed)
So unfortunately I do not know any primary care doctors in that area  [facility] a large [facility] in [place] Their website [email or web address] has a "find a doctor" link that we will list out primary care doctors who are within their system and where they are located and whether or not they are taking new patients.  Also please find out from [place] and/or his wife Andrew Guerra when will they be moving to that area? (I definitely want to give them a call to wished them well)

## 2021-12-06 DIAGNOSIS — Z992 Dependence on renal dialysis: Secondary | ICD-10-CM | POA: Diagnosis not present

## 2021-12-06 DIAGNOSIS — N186 End stage renal disease: Secondary | ICD-10-CM | POA: Diagnosis not present

## 2021-12-06 DIAGNOSIS — N2581 Secondary hyperparathyroidism of renal origin: Secondary | ICD-10-CM | POA: Diagnosis not present

## 2021-12-08 DIAGNOSIS — N2581 Secondary hyperparathyroidism of renal origin: Secondary | ICD-10-CM | POA: Diagnosis not present

## 2021-12-08 DIAGNOSIS — Z992 Dependence on renal dialysis: Secondary | ICD-10-CM | POA: Diagnosis not present

## 2021-12-08 DIAGNOSIS — N186 End stage renal disease: Secondary | ICD-10-CM | POA: Diagnosis not present

## 2021-12-11 DIAGNOSIS — Z992 Dependence on renal dialysis: Secondary | ICD-10-CM | POA: Diagnosis not present

## 2021-12-11 DIAGNOSIS — N186 End stage renal disease: Secondary | ICD-10-CM | POA: Diagnosis not present

## 2021-12-11 DIAGNOSIS — N2581 Secondary hyperparathyroidism of renal origin: Secondary | ICD-10-CM | POA: Diagnosis not present

## 2021-12-14 DIAGNOSIS — Z992 Dependence on renal dialysis: Secondary | ICD-10-CM | POA: Diagnosis not present

## 2021-12-14 DIAGNOSIS — N186 End stage renal disease: Secondary | ICD-10-CM | POA: Diagnosis not present

## 2021-12-14 DIAGNOSIS — N2581 Secondary hyperparathyroidism of renal origin: Secondary | ICD-10-CM | POA: Diagnosis not present

## 2021-12-14 NOTE — Telephone Encounter (Signed)
Card sent I tried calling and unfortunately phone number did not not connect with them

## 2021-12-16 ENCOUNTER — Non-Acute Institutional Stay (HOSPITAL_COMMUNITY)
Admission: RE | Admit: 2021-12-16 | Discharge: 2021-12-16 | Disposition: A | Discharge status: Home / Self Care | Payer: Medicare PPO | Source: Ambulatory Visit | Attending: Internal Medicine | Admitting: Internal Medicine

## 2021-12-16 ENCOUNTER — Encounter (HOSPITAL_COMMUNITY): Payer: Self-pay

## 2021-12-16 DIAGNOSIS — D500 Iron deficiency anemia secondary to blood loss (chronic): Secondary | ICD-10-CM

## 2021-12-16 DIAGNOSIS — K5520 Angiodysplasia of colon without hemorrhage: Secondary | ICD-10-CM

## 2021-12-16 MED ORDER — OCTREOTIDE ACETATE 20 MG IM KIT
20.0000 mg | PACK | INTRAMUSCULAR | Status: DC
  Filled 2021-12-16: qty 1

## 2021-12-16 NOTE — Progress Notes (Signed)
Patient came to day hospital today for Octreotide (Sandrostatin LAR) IM injection. Pt due to receive injection every 30 days. Pt last received dose on 11/26/2021. This RN reached out to Exie Parody RN to see if it is ok to administer dose today since it had not been 30 days since last dose. Bonner Puna RN reached out to [name] MD to see if it was ok to administer today. Pt left clinic due to not being able to wait any longer. Bonner Puna RN notified that pt left and did not receive dose today.

## 2021-12-17 DIAGNOSIS — Z992 Dependence on renal dialysis: Secondary | ICD-10-CM | POA: Diagnosis not present

## 2021-12-17 DIAGNOSIS — N032 Chronic nephritic syndrome with diffuse membranous glomerulonephritis: Secondary | ICD-10-CM | POA: Diagnosis not present

## 2021-12-17 DIAGNOSIS — N186 End stage renal disease: Secondary | ICD-10-CM | POA: Diagnosis not present

## 2021-12-18 DIAGNOSIS — N2581 Secondary hyperparathyroidism of renal origin: Secondary | ICD-10-CM | POA: Diagnosis not present

## 2021-12-18 DIAGNOSIS — Z992 Dependence on renal dialysis: Secondary | ICD-10-CM | POA: Diagnosis not present

## 2021-12-18 DIAGNOSIS — N186 End stage renal disease: Secondary | ICD-10-CM | POA: Diagnosis not present

## 2021-12-21 DIAGNOSIS — N2581 Secondary hyperparathyroidism of renal origin: Secondary | ICD-10-CM | POA: Diagnosis not present

## 2021-12-21 DIAGNOSIS — Z992 Dependence on renal dialysis: Secondary | ICD-10-CM | POA: Diagnosis not present

## 2021-12-21 DIAGNOSIS — N186 End stage renal disease: Secondary | ICD-10-CM | POA: Diagnosis not present

## 2021-12-23 DIAGNOSIS — N2581 Secondary hyperparathyroidism of renal origin: Secondary | ICD-10-CM | POA: Diagnosis not present

## 2021-12-23 DIAGNOSIS — N186 End stage renal disease: Secondary | ICD-10-CM | POA: Diagnosis not present

## 2021-12-23 DIAGNOSIS — Z992 Dependence on renal dialysis: Secondary | ICD-10-CM | POA: Diagnosis not present

## 2021-12-24 ENCOUNTER — Encounter (HOSPITAL_COMMUNITY): Payer: Medicare PPO

## 2021-12-25 DIAGNOSIS — N2581 Secondary hyperparathyroidism of renal origin: Secondary | ICD-10-CM | POA: Diagnosis not present

## 2021-12-25 DIAGNOSIS — Z992 Dependence on renal dialysis: Secondary | ICD-10-CM | POA: Diagnosis not present

## 2021-12-25 DIAGNOSIS — N186 End stage renal disease: Secondary | ICD-10-CM | POA: Diagnosis not present

## 2021-12-28 DIAGNOSIS — N2581 Secondary hyperparathyroidism of renal origin: Secondary | ICD-10-CM | POA: Diagnosis not present

## 2021-12-28 DIAGNOSIS — Z992 Dependence on renal dialysis: Secondary | ICD-10-CM | POA: Diagnosis not present

## 2021-12-28 DIAGNOSIS — N186 End stage renal disease: Secondary | ICD-10-CM | POA: Diagnosis not present

## 2021-12-30 DIAGNOSIS — Z992 Dependence on renal dialysis: Secondary | ICD-10-CM | POA: Diagnosis not present

## 2021-12-30 DIAGNOSIS — N186 End stage renal disease: Secondary | ICD-10-CM | POA: Diagnosis not present

## 2021-12-30 DIAGNOSIS — N2581 Secondary hyperparathyroidism of renal origin: Secondary | ICD-10-CM | POA: Diagnosis not present

## 2022-01-01 DIAGNOSIS — N186 End stage renal disease: Secondary | ICD-10-CM | POA: Diagnosis not present

## 2022-01-01 DIAGNOSIS — Z992 Dependence on renal dialysis: Secondary | ICD-10-CM | POA: Diagnosis not present

## 2022-01-01 DIAGNOSIS — N2581 Secondary hyperparathyroidism of renal origin: Secondary | ICD-10-CM | POA: Diagnosis not present

## 2022-01-04 DIAGNOSIS — N2581 Secondary hyperparathyroidism of renal origin: Secondary | ICD-10-CM | POA: Diagnosis not present

## 2022-01-04 DIAGNOSIS — N186 End stage renal disease: Secondary | ICD-10-CM | POA: Diagnosis not present

## 2022-01-04 DIAGNOSIS — Z992 Dependence on renal dialysis: Secondary | ICD-10-CM | POA: Diagnosis not present

## 2022-01-05 DIAGNOSIS — I10 Essential (primary) hypertension: Secondary | ICD-10-CM | POA: Diagnosis not present

## 2022-01-05 DIAGNOSIS — Z131 Encounter for screening for diabetes mellitus: Secondary | ICD-10-CM | POA: Diagnosis not present

## 2022-01-05 DIAGNOSIS — J9611 Chronic respiratory failure with hypoxia: Secondary | ICD-10-CM | POA: Diagnosis not present

## 2022-01-05 DIAGNOSIS — Z992 Dependence on renal dialysis: Secondary | ICD-10-CM | POA: Diagnosis not present

## 2022-01-05 DIAGNOSIS — Z9981 Dependence on supplemental oxygen: Secondary | ICD-10-CM | POA: Diagnosis not present

## 2022-01-05 DIAGNOSIS — Z79899 Other long term (current) drug therapy: Secondary | ICD-10-CM | POA: Diagnosis not present

## 2022-01-05 DIAGNOSIS — N186 End stage renal disease: Secondary | ICD-10-CM | POA: Diagnosis not present

## 2022-01-05 DIAGNOSIS — Z1159 Encounter for screening for other viral diseases: Secondary | ICD-10-CM | POA: Diagnosis not present

## 2022-01-05 DIAGNOSIS — Z125 Encounter for screening for malignant neoplasm of prostate: Secondary | ICD-10-CM | POA: Diagnosis not present

## 2022-01-06 DIAGNOSIS — Z992 Dependence on renal dialysis: Secondary | ICD-10-CM | POA: Diagnosis not present

## 2022-01-06 DIAGNOSIS — N186 End stage renal disease: Secondary | ICD-10-CM | POA: Diagnosis not present

## 2022-01-06 DIAGNOSIS — N2581 Secondary hyperparathyroidism of renal origin: Secondary | ICD-10-CM | POA: Diagnosis not present

## 2022-01-08 DIAGNOSIS — N186 End stage renal disease: Secondary | ICD-10-CM | POA: Diagnosis not present

## 2022-01-08 DIAGNOSIS — N2581 Secondary hyperparathyroidism of renal origin: Secondary | ICD-10-CM | POA: Diagnosis not present

## 2022-01-08 DIAGNOSIS — J9611 Chronic respiratory failure with hypoxia: Secondary | ICD-10-CM | POA: Diagnosis not present

## 2022-01-08 DIAGNOSIS — Z992 Dependence on renal dialysis: Secondary | ICD-10-CM | POA: Diagnosis not present

## 2022-01-10 DIAGNOSIS — Z992 Dependence on renal dialysis: Secondary | ICD-10-CM | POA: Diagnosis not present

## 2022-01-10 DIAGNOSIS — N2581 Secondary hyperparathyroidism of renal origin: Secondary | ICD-10-CM | POA: Diagnosis not present

## 2022-01-10 DIAGNOSIS — N186 End stage renal disease: Secondary | ICD-10-CM | POA: Diagnosis not present

## 2022-01-13 DIAGNOSIS — Z992 Dependence on renal dialysis: Secondary | ICD-10-CM | POA: Diagnosis not present

## 2022-01-13 DIAGNOSIS — N2581 Secondary hyperparathyroidism of renal origin: Secondary | ICD-10-CM | POA: Diagnosis not present

## 2022-01-13 DIAGNOSIS — N186 End stage renal disease: Secondary | ICD-10-CM | POA: Diagnosis not present

## 2022-01-15 DIAGNOSIS — N2581 Secondary hyperparathyroidism of renal origin: Secondary | ICD-10-CM | POA: Diagnosis not present

## 2022-01-15 DIAGNOSIS — N186 End stage renal disease: Secondary | ICD-10-CM | POA: Diagnosis not present

## 2022-01-15 DIAGNOSIS — Z992 Dependence on renal dialysis: Secondary | ICD-10-CM | POA: Diagnosis not present

## 2022-01-17 DIAGNOSIS — N2581 Secondary hyperparathyroidism of renal origin: Secondary | ICD-10-CM | POA: Diagnosis not present

## 2022-01-17 DIAGNOSIS — Z992 Dependence on renal dialysis: Secondary | ICD-10-CM | POA: Diagnosis not present

## 2022-01-17 DIAGNOSIS — N186 End stage renal disease: Secondary | ICD-10-CM | POA: Diagnosis not present

## 2022-01-20 DIAGNOSIS — N186 End stage renal disease: Secondary | ICD-10-CM | POA: Diagnosis not present

## 2022-01-20 DIAGNOSIS — N2581 Secondary hyperparathyroidism of renal origin: Secondary | ICD-10-CM | POA: Diagnosis not present

## 2022-01-20 DIAGNOSIS — Z992 Dependence on renal dialysis: Secondary | ICD-10-CM | POA: Diagnosis not present

## 2022-01-24 ENCOUNTER — Other Ambulatory Visit: Payer: Self-pay | Admitting: Gastroenterology

## 2022-01-25 DIAGNOSIS — N186 End stage renal disease: Secondary | ICD-10-CM | POA: Diagnosis not present

## 2022-01-25 DIAGNOSIS — Z992 Dependence on renal dialysis: Secondary | ICD-10-CM | POA: Diagnosis not present

## 2022-01-25 DIAGNOSIS — N2581 Secondary hyperparathyroidism of renal origin: Secondary | ICD-10-CM | POA: Diagnosis not present

## 2022-01-29 DIAGNOSIS — N2581 Secondary hyperparathyroidism of renal origin: Secondary | ICD-10-CM | POA: Diagnosis not present

## 2022-01-29 DIAGNOSIS — N186 End stage renal disease: Secondary | ICD-10-CM | POA: Diagnosis not present

## 2022-01-29 DIAGNOSIS — Z992 Dependence on renal dialysis: Secondary | ICD-10-CM | POA: Diagnosis not present

## 2022-02-01 DIAGNOSIS — Z992 Dependence on renal dialysis: Secondary | ICD-10-CM | POA: Diagnosis not present

## 2022-02-01 DIAGNOSIS — N2581 Secondary hyperparathyroidism of renal origin: Secondary | ICD-10-CM | POA: Diagnosis not present

## 2022-02-01 DIAGNOSIS — N186 End stage renal disease: Secondary | ICD-10-CM | POA: Diagnosis not present

## 2022-02-08 DIAGNOSIS — N186 End stage renal disease: Secondary | ICD-10-CM | POA: Diagnosis not present

## 2022-02-08 DIAGNOSIS — Z992 Dependence on renal dialysis: Secondary | ICD-10-CM | POA: Diagnosis not present

## 2022-02-08 DIAGNOSIS — J9611 Chronic respiratory failure with hypoxia: Secondary | ICD-10-CM | POA: Diagnosis not present

## 2022-02-08 DIAGNOSIS — N2581 Secondary hyperparathyroidism of renal origin: Secondary | ICD-10-CM | POA: Diagnosis not present

## 2022-02-12 DIAGNOSIS — N186 End stage renal disease: Secondary | ICD-10-CM | POA: Diagnosis not present

## 2022-02-12 DIAGNOSIS — Z992 Dependence on renal dialysis: Secondary | ICD-10-CM | POA: Diagnosis not present

## 2022-02-12 DIAGNOSIS — N2581 Secondary hyperparathyroidism of renal origin: Secondary | ICD-10-CM | POA: Diagnosis not present

## 2022-02-15 DIAGNOSIS — N186 End stage renal disease: Secondary | ICD-10-CM | POA: Diagnosis not present

## 2022-02-15 DIAGNOSIS — N2581 Secondary hyperparathyroidism of renal origin: Secondary | ICD-10-CM | POA: Diagnosis not present

## 2022-02-15 DIAGNOSIS — Z992 Dependence on renal dialysis: Secondary | ICD-10-CM | POA: Diagnosis not present

## 2022-02-17 DIAGNOSIS — N186 End stage renal disease: Secondary | ICD-10-CM | POA: Diagnosis not present

## 2022-02-17 DIAGNOSIS — Z992 Dependence on renal dialysis: Secondary | ICD-10-CM | POA: Diagnosis not present

## 2022-02-17 DIAGNOSIS — N2581 Secondary hyperparathyroidism of renal origin: Secondary | ICD-10-CM | POA: Diagnosis not present

## 2022-02-18 ENCOUNTER — Inpatient Hospital Stay
Admit: 2022-02-18 | Discharge: 2022-02-19 | Discharge status: Against Medical Advice | Payer: MEDICARE | Attending: Emergency Medicine

## 2022-02-18 ENCOUNTER — Emergency Department: Admit: 2022-02-18 | Payer: MEDICARE

## 2022-02-18 DIAGNOSIS — R5383 Other fatigue: Secondary | ICD-10-CM

## 2022-02-18 DIAGNOSIS — U071 COVID-19: Secondary | ICD-10-CM

## 2022-02-18 DIAGNOSIS — J449 Chronic obstructive pulmonary disease, unspecified: Secondary | ICD-10-CM | POA: Diagnosis not present

## 2022-02-18 DIAGNOSIS — N186 End stage renal disease: Secondary | ICD-10-CM | POA: Diagnosis not present

## 2022-02-18 DIAGNOSIS — Z5329 Procedure and treatment not carried out because of patient's decision for other reasons: Secondary | ICD-10-CM | POA: Diagnosis not present

## 2022-02-18 DIAGNOSIS — R9389 Abnormal findings on diagnostic imaging of other specified body structures: Secondary | ICD-10-CM | POA: Diagnosis not present

## 2022-02-18 DIAGNOSIS — J9611 Chronic respiratory failure with hypoxia: Secondary | ICD-10-CM | POA: Diagnosis not present

## 2022-02-18 DIAGNOSIS — R42 Dizziness and giddiness: Secondary | ICD-10-CM | POA: Diagnosis not present

## 2022-02-18 DIAGNOSIS — R918 Other nonspecific abnormal finding of lung field: Secondary | ICD-10-CM | POA: Diagnosis not present

## 2022-02-18 DIAGNOSIS — Z992 Dependence on renal dialysis: Secondary | ICD-10-CM | POA: Diagnosis not present

## 2022-02-18 DIAGNOSIS — D649 Anemia, unspecified: Secondary | ICD-10-CM | POA: Diagnosis not present

## 2022-02-18 DIAGNOSIS — I959 Hypotension, unspecified: Secondary | ICD-10-CM | POA: Diagnosis not present

## 2022-02-18 LAB — CBC WITH AUTO DIFFERENTIAL
Basophils %: 0 % (ref 0.0–2.0)
Basophils Absolute: 0.02 10*3/uL (ref 0.00–0.20)
Eosinophils %: 0 % (ref 0–6)
Eosinophils Absolute: 0.06 10*3/uL (ref 0.05–0.50)
Hematocrit: 24.2 % — ABNORMAL LOW (ref 37.0–54.0)
Hemoglobin: 7.4 g/dL — ABNORMAL LOW (ref 12.5–16.5)
Immature Granulocytes %: 1 % (ref 0.0–5.0)
Immature Granulocytes Absolute: 0.13 10*3/uL (ref 0.00–0.58)
Lymphocytes %: 4 % — ABNORMAL LOW (ref 20.0–42.0)
Lymphocytes Absolute: 0.61 10*3/uL — ABNORMAL LOW (ref 1.50–4.00)
MCH: 28.6 pg (ref 26.0–35.0)
MCHC: 30.6 g/dL — ABNORMAL LOW (ref 32.0–34.5)
MCV: 93.4 fL (ref 80.0–99.9)
MPV: 10.1 fL (ref 7.0–12.0)
Monocytes %: 6 % (ref 2.0–12.0)
Monocytes Absolute: 0.96 10*3/uL — ABNORMAL HIGH (ref 0.10–0.95)
Neutrophils %: 89 % — ABNORMAL HIGH (ref 43.0–80.0)
Neutrophils Absolute: 14.7 10*3/uL — ABNORMAL HIGH (ref 1.80–7.30)
Platelets: 276 10*3/uL (ref 130–450)
RBC: 2.59 m/uL — ABNORMAL LOW (ref 3.80–5.80)
RDW: 18 % — ABNORMAL HIGH (ref 11.5–15.0)
WBC: 16.5 10*3/uL — ABNORMAL HIGH (ref 4.5–11.5)

## 2022-02-18 LAB — COMPREHENSIVE METABOLIC PANEL
ALT: 22 U/L (ref 0–40)
AST: 23 U/L (ref 0–39)
Albumin: 3.5 g/dL (ref 3.5–5.2)
Alkaline Phosphatase: 122 U/L (ref 40–129)
Anion Gap: 16 mmol/L (ref 7–16)
BUN: 36 mg/dL — ABNORMAL HIGH (ref 6–23)
CO2: 31 mmol/L — ABNORMAL HIGH (ref 22–29)
Calcium: 8.7 mg/dL (ref 8.6–10.2)
Chloride: 94 mmol/L — ABNORMAL LOW (ref 98–107)
Creatinine: 5.6 mg/dL — ABNORMAL HIGH (ref 0.70–1.20)
Est, Glom Filt Rate: 9 mL/min/{1.73_m2} — ABNORMAL LOW (ref >60)
Glucose: 85 mg/dL (ref 74–99)
Potassium: 3.2 mmol/L — ABNORMAL LOW (ref 3.5–5.0)
Sodium: 141 mmol/L (ref 132–146)
Total Bilirubin: 0.3 mg/dL (ref 0.0–1.2)
Total Protein: 6.4 g/dL (ref 6.4–8.3)

## 2022-02-18 LAB — TROPONIN: Troponin, High Sensitivity: 164 ng/L — ABNORMAL HIGH (ref 0–11)

## 2022-02-18 MED ORDER — SODIUM CHLORIDE 0.9 % IV BOLUS
0.9 % | Freq: Once | INTRAVENOUS | Status: AC
Start: 2022-02-18 — End: 2022-02-18
  Administered 2022-02-18: 22:00:00 500 mL via INTRAVENOUS

## 2022-02-18 NOTE — Discharge Instructions (Signed)
Follow up with the primary physician listed above.

## 2022-02-18 NOTE — ED Notes (Signed)
Gait steady with use of his rolling walker.

## 2022-02-18 NOTE — ED Provider Notes (Cosign Needed)
[facility] EMERGENCY DEPARTMENT  EMERGENCY DEPARTMENT ENCOUNTER        Pt Name: Tyler Harris  MRN: [id number]  Birthdate 1940-04-29  Date of evaluation: 02/18/2022  Provider: Arlana Hove, DO  PCP: No primary care provider on file.  Note Started: 3:51 PM EST 02/18/22    CHIEF COMPLAINT       Chief Complaint   Patient presents with    Fatigue     PT WAS AT [facility] AND SENT TO ER FOR LOW BP. PT REPORTS INCREASED FATIGUE       HISTORY OF PRESENT ILLNESS: 1 or more Elements   History From: PATIENT     Limitations to history : None    Tyler Harris is a 82 y.o. 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  He was at a clinic earlier today where they found him to have a lower blood pressure so they sent him to the emergency room.  Patient reports increased loss of appetite and generalized fatigue recently.  He states he has missed dialysis sessions recently but did attend his session yesterday.      Nursing Notes were all reviewed and agreed with or any disagreements were addressed in the HPI.    REVIEW OF SYSTEMS :      Review of Systems    POSITIVE (+): Generalized fatigue  NEGATIVE (-): fevers, chills, nausea, vomiting, diarrhea, constipation, shortness of breath, chest pain, abdominal pain      SURGICAL HISTORY   No past surgical history on file.    CURRENTMEDICATIONS       There are no discharge medications for this patient.      ALLERGIES     Patient has no known allergies.    FAMILYHISTORY     No family history on file.     SOCIAL HISTORY          SCREENINGS        Glasgow Coma Scale  Eye Opening: Spontaneous  Best Verbal Response: Oriented  Best Motor Response: Obeys commands  Glasgow Coma Scale Score: 15                CIWA Assessment  BP: (!) 106/59  Pulse: 78           PHYSICAL EXAM  1 or more Elements     ED Triage Vitals [02/18/22 1528]   BP Temp Temp src Pulse Respirations SpO2 Height Weight    (!) 111/47 98 F (36.7 C) -- 85 16 96 % -- --       Physical Exam  Constitutional:       General: He is not in acute distress.     Appearance: Normal appearance. He is not ill-appearing.   HENT:      Head: Normocephalic.      Mouth/Throat:      Mouth: Mucous membranes are moist.      Comments: Mucous membranes appear very dry   Eyes:      Conjunctiva/sclera: Conjunctivae normal.      Pupils: Pupils are equal, round, and reactive to light.   Cardiovascular:      Rate and Rhythm: Normal rate and regular rhythm.      Pulses: Normal pulses.   Pulmonary:      Effort: Pulmonary effort is normal. No respiratory distress.      Breath sounds: Normal breath sounds. No stridor. No wheezing or  rales.   Abdominal:      General: There is no distension.      Palpations: Abdomen is soft.      Tenderness: There is no abdominal tenderness. There is no guarding.      Comments: No tenderness to palpation    Musculoskeletal:      Cervical back: Normal range of motion and neck supple.      Right lower leg: No edema.      Left lower leg: No edema.   Skin:     Findings: No erythema.           DIAGNOSTIC RESULTS   LABS:    Labs Reviewed   COVID-19 & INFLUENZA COMBO - Abnormal; Notable for the following components:       Result Value    SARS-CoV-2 RNA, RT PCR DETECTED (*)     All other components within normal limits   CBC WITH AUTO DIFFERENTIAL - Abnormal; Notable for the following components:    WBC 16.5 (*)     RBC 2.59 (*)     Hemoglobin 7.4 (*)     Hematocrit 24.2 (*)     MCHC 30.6 (*)     RDW 18.0 (*)     Neutrophils % 89 (*)     Lymphocytes % 4 (*)     Neutrophils Absolute 14.70 (*)     Lymphocytes Absolute 0.61 (*)     Monocytes Absolute 0.96 (*)     All other components within normal limits   COMPREHENSIVE METABOLIC PANEL - Abnormal; Notable for the following components:    Potassium 3.2 (*)     Chloride 94 (*)     CO2 31 (*)     BUN 36 (*)     Creatinine 5.6 (*)     Est, Glom Filt Rate 9 (*)     All other components within  normal limits   TROPONIN - Abnormal; Notable for the following components:    Troponin, High Sensitivity 164 (*)     All other components within normal limits   TROPONIN - Abnormal; Notable for the following components:    Troponin, High Sensitivity 159 (*)     All other components within normal limits   INFLUENZA A + B, PCR   POCT GLUCOSE       When ordered only abnormal lab results are displayed. All other labs were within normal range or not returned as of this dictation.    EKG:      EKG:  This EKG is signed by emergency department physician.    Rate: 76  Rhythm: normal sinus  Interpretation: no acute abnormalities   Comparison: none available         RADIOLOGY:   Non-plain film images such as CT, Ultrasound and MRI are read by the radiologist. Plain radiographic images are visualized and preliminarily interpreted by the ED Provider with the below findings:    XR CHEST PORTABLE   Final Result   1. Postop changes in the right infrahilar region with elevation of the right   hemidiaphragm.   2. Blunting of the right costophrenic angle may be due to pleural thickening   or small effusion.      RECOMMENDATION:   If there is continued concern, consider CT chest with contrast.               Interpretation per the Radiologist below, if available at the time of this note:  PROCEDURES   Unless otherwise noted below, none     Procedures      PAST MEDICAL HISTORY      has no past medical history on file.     EMERGENCY DEPARTMENT COURSE and DIFFERENTIAL DIAGNOSIS/MDM:   Vitals:    Vitals:    02/18/22 1528 02/18/22 1936   BP: (!) 111/47 (!) 106/59   Pulse: 85 78   Resp: 16 16   Temp: 98 F (36.7 C)    SpO2: 96% 100%                 MDM  82 year old male arriving with generalized fatigue with end-stage renal disease currently on dialysis.  Patient appears to be in no acute distress.  Vitals are within normal limits despite a report of low blood pressure prior to arrival.    DDX: Dehydration, electrolyte disturbance,  viral syndrome, cardiac arrhythmia, MI to name a few    ED course  The patient was evaluated and found to have dry mucous membranes.  Labs and imaging were ordered.  Patient was given a 500 mL bolus.  EKG showed no ischemia.  Labs indicated a white count of 16.5 and positive COVID rapid testing.  Troponin level 159 that was delta negative.  Hemoglobin level of 7.4.  Potassium of 3.2.  Creatinine of 5.6 (dialysis).  Records were unavailable in our EMR so I did attempt to call the clinic where he states his records were sent to however they were unavailable.  Seeing how we had no records and he has a hemoglobin of 7.4 I requested to do a Hemoccult exam although the patient refused.  Patient requested to leave AMA despite my warnings that if he had a GI bleed that he could lose consciousness or die.  I have verified on 2 separate occasions that the patient was fully oriented and he was able to answer the year the month and his location and name.  Patient left AMA.  Medications   sodium chloride 0.9 % bolus 500 mL (0 mLs IntraVENous Stopped 02/18/22 1831)    (medications given in the ED and medications ordered by admitting physician at time of submitting note)      CONSULTS: (Who and What was discussed)  None      Social Determinants : None      Records Reviewed (source and summary): NONE available     Disposition Considerations:   Hemmocult exam not performed because the patient refused and requested to leave AMA.       I am the Primary Clinician of Record.    FINAL IMPRESSION      1. Other fatigue    2. Anemia, unspecified type          DISPOSITION/PLAN     DISPOSITION Ama 02/18/2022 07:30:15 PM      PATIENT REFERRED TO:  [facility]  [address]  [place] [address]  [phone]  Schedule an appointment as soon as possible for a visit   for follow-up appointment      DISCHARGE MEDICATIONS:  There are no discharge medications for this patient.      DISCONTINUED MEDICATIONS:  There  are no discharge medications for this patient.             (Please note that portions of this note were completed with a voice recognition program.  Efforts were made to edit the dictations but occasionally words are mis-transcribed.)    Arlana Hove, DO (electronically  signed)

## 2022-02-19 DIAGNOSIS — Z0389 Encounter for observation for other suspected diseases and conditions ruled out: Secondary | ICD-10-CM | POA: Diagnosis not present

## 2022-02-19 LAB — COVID-19 & INFLUENZA COMBO
Influenza A: NOT DETECTED
Influenza B: NOT DETECTED
SARS-CoV-2 RNA, RT PCR: DETECTED — AB

## 2022-02-19 LAB — INFLUENZA A + B, PCR
Influenza A by PCR: NOT DETECTED
Influenza B by PCR: NOT DETECTED

## 2022-02-19 LAB — EKG 12-LEAD
Atrial Rate: 76 {beats}/min
P Axis: 65 degrees
P-R Interval: 202 ms
Q-T Interval: 502 ms
QRS Duration: 182 ms
QTc Calculation (Bazett): 564 ms
R Axis: -96 degrees
T Axis: 49 degrees
Ventricular Rate: 76 {beats}/min

## 2022-02-19 LAB — TROPONIN: Troponin, High Sensitivity: 159 ng/L — ABNORMAL HIGH (ref 0–11)

## 2022-02-20 DIAGNOSIS — Z992 Dependence on renal dialysis: Secondary | ICD-10-CM | POA: Diagnosis not present

## 2022-02-20 DIAGNOSIS — N186 End stage renal disease: Secondary | ICD-10-CM | POA: Diagnosis not present

## 2022-02-20 DIAGNOSIS — N2581 Secondary hyperparathyroidism of renal origin: Secondary | ICD-10-CM | POA: Diagnosis not present

## 2022-02-22 DIAGNOSIS — Z992 Dependence on renal dialysis: Secondary | ICD-10-CM | POA: Diagnosis not present

## 2022-02-22 DIAGNOSIS — N2581 Secondary hyperparathyroidism of renal origin: Secondary | ICD-10-CM | POA: Diagnosis not present

## 2022-02-22 DIAGNOSIS — N186 End stage renal disease: Secondary | ICD-10-CM | POA: Diagnosis not present

## 2022-02-24 DIAGNOSIS — N186 End stage renal disease: Secondary | ICD-10-CM | POA: Diagnosis not present

## 2022-02-24 DIAGNOSIS — Z992 Dependence on renal dialysis: Secondary | ICD-10-CM | POA: Diagnosis not present

## 2022-02-24 DIAGNOSIS — N2581 Secondary hyperparathyroidism of renal origin: Secondary | ICD-10-CM | POA: Diagnosis not present

## 2022-02-26 DIAGNOSIS — N2581 Secondary hyperparathyroidism of renal origin: Secondary | ICD-10-CM | POA: Diagnosis not present

## 2022-02-26 DIAGNOSIS — Z992 Dependence on renal dialysis: Secondary | ICD-10-CM | POA: Diagnosis not present

## 2022-02-26 DIAGNOSIS — N186 End stage renal disease: Secondary | ICD-10-CM | POA: Diagnosis not present

## 2022-03-01 DIAGNOSIS — Z992 Dependence on renal dialysis: Secondary | ICD-10-CM | POA: Diagnosis not present

## 2022-03-01 DIAGNOSIS — N186 End stage renal disease: Secondary | ICD-10-CM | POA: Diagnosis not present

## 2022-03-01 DIAGNOSIS — N2581 Secondary hyperparathyroidism of renal origin: Secondary | ICD-10-CM | POA: Diagnosis not present

## 2022-03-03 DIAGNOSIS — Z992 Dependence on renal dialysis: Secondary | ICD-10-CM | POA: Diagnosis not present

## 2022-03-03 DIAGNOSIS — N186 End stage renal disease: Secondary | ICD-10-CM | POA: Diagnosis not present

## 2022-03-03 DIAGNOSIS — N2581 Secondary hyperparathyroidism of renal origin: Secondary | ICD-10-CM | POA: Diagnosis not present

## 2022-03-05 DIAGNOSIS — Z992 Dependence on renal dialysis: Secondary | ICD-10-CM | POA: Diagnosis not present

## 2022-03-05 DIAGNOSIS — N2581 Secondary hyperparathyroidism of renal origin: Secondary | ICD-10-CM | POA: Diagnosis not present

## 2022-03-05 DIAGNOSIS — N186 End stage renal disease: Secondary | ICD-10-CM | POA: Diagnosis not present

## 2022-03-08 DIAGNOSIS — N2581 Secondary hyperparathyroidism of renal origin: Secondary | ICD-10-CM | POA: Diagnosis not present

## 2022-03-08 DIAGNOSIS — N186 End stage renal disease: Secondary | ICD-10-CM | POA: Diagnosis not present

## 2022-03-08 DIAGNOSIS — Z992 Dependence on renal dialysis: Secondary | ICD-10-CM | POA: Diagnosis not present

## 2022-03-11 DIAGNOSIS — J9611 Chronic respiratory failure with hypoxia: Secondary | ICD-10-CM | POA: Diagnosis not present

## 2022-03-12 DIAGNOSIS — Z992 Dependence on renal dialysis: Secondary | ICD-10-CM | POA: Diagnosis not present

## 2022-03-12 DIAGNOSIS — N186 End stage renal disease: Secondary | ICD-10-CM | POA: Diagnosis not present

## 2022-03-15 DIAGNOSIS — N186 End stage renal disease: Secondary | ICD-10-CM | POA: Diagnosis not present

## 2022-03-15 DIAGNOSIS — Z992 Dependence on renal dialysis: Secondary | ICD-10-CM | POA: Diagnosis not present

## 2022-03-18 DIAGNOSIS — N186 End stage renal disease: Secondary | ICD-10-CM | POA: Diagnosis not present

## 2022-03-18 DIAGNOSIS — Z992 Dependence on renal dialysis: Secondary | ICD-10-CM | POA: Diagnosis not present

## 2022-03-19 DIAGNOSIS — Z992 Dependence on renal dialysis: Secondary | ICD-10-CM | POA: Diagnosis not present

## 2022-03-19 DIAGNOSIS — N186 End stage renal disease: Secondary | ICD-10-CM | POA: Diagnosis not present

## 2022-03-22 DIAGNOSIS — Z992 Dependence on renal dialysis: Secondary | ICD-10-CM | POA: Diagnosis not present

## 2022-03-22 DIAGNOSIS — N186 End stage renal disease: Secondary | ICD-10-CM | POA: Diagnosis not present

## 2022-03-24 DIAGNOSIS — N186 End stage renal disease: Secondary | ICD-10-CM | POA: Diagnosis not present

## 2022-03-24 DIAGNOSIS — Z992 Dependence on renal dialysis: Secondary | ICD-10-CM | POA: Diagnosis not present

## 2022-03-29 DIAGNOSIS — Z992 Dependence on renal dialysis: Secondary | ICD-10-CM | POA: Diagnosis not present

## 2022-03-29 DIAGNOSIS — N186 End stage renal disease: Secondary | ICD-10-CM | POA: Diagnosis not present

## 2022-03-30 DIAGNOSIS — N186 End stage renal disease: Secondary | ICD-10-CM | POA: Diagnosis not present

## 2022-03-30 DIAGNOSIS — Z992 Dependence on renal dialysis: Secondary | ICD-10-CM | POA: Diagnosis not present

## 2022-03-30 DIAGNOSIS — T82858A Stenosis of vascular prosthetic devices, implants and grafts, initial encounter: Secondary | ICD-10-CM | POA: Diagnosis not present

## 2022-03-30 DIAGNOSIS — I771 Stricture of artery: Secondary | ICD-10-CM | POA: Diagnosis not present

## 2022-03-31 DIAGNOSIS — N186 End stage renal disease: Secondary | ICD-10-CM | POA: Diagnosis not present

## 2022-03-31 DIAGNOSIS — Z992 Dependence on renal dialysis: Secondary | ICD-10-CM | POA: Diagnosis not present

## 2022-04-02 DIAGNOSIS — Z992 Dependence on renal dialysis: Secondary | ICD-10-CM | POA: Diagnosis not present

## 2022-04-02 DIAGNOSIS — N186 End stage renal disease: Secondary | ICD-10-CM | POA: Diagnosis not present

## 2022-04-05 DIAGNOSIS — N186 End stage renal disease: Secondary | ICD-10-CM | POA: Diagnosis not present

## 2022-04-05 DIAGNOSIS — Z992 Dependence on renal dialysis: Secondary | ICD-10-CM | POA: Diagnosis not present

## 2022-04-06 ENCOUNTER — Encounter: Admit: 2022-04-06 | Discharge: 2022-04-06 | Payer: MEDICARE | Attending: Internal Medicine | Primary: Internal Medicine

## 2022-04-06 DIAGNOSIS — Z992 Dependence on renal dialysis: Secondary | ICD-10-CM

## 2022-04-06 DIAGNOSIS — N189 Chronic kidney disease, unspecified: Secondary | ICD-10-CM | POA: Diagnosis not present

## 2022-04-06 DIAGNOSIS — K219 Gastro-esophageal reflux disease without esophagitis: Secondary | ICD-10-CM | POA: Diagnosis not present

## 2022-04-06 DIAGNOSIS — C3491 Malignant neoplasm of unspecified part of right bronchus or lung: Secondary | ICD-10-CM | POA: Diagnosis not present

## 2022-04-06 DIAGNOSIS — Z789 Other specified health status: Secondary | ICD-10-CM | POA: Diagnosis not present

## 2022-04-06 DIAGNOSIS — I10 Essential (primary) hypertension: Secondary | ICD-10-CM | POA: Diagnosis not present

## 2022-04-06 DIAGNOSIS — N186 End stage renal disease: Secondary | ICD-10-CM | POA: Diagnosis not present

## 2022-04-06 DIAGNOSIS — D631 Anemia in chronic kidney disease: Secondary | ICD-10-CM | POA: Diagnosis not present

## 2022-04-06 NOTE — Assessment & Plan Note (Signed)
patient currently denying any symptoms, will continue current dose of pantoprazole

## 2022-04-06 NOTE — Assessment & Plan Note (Signed)
patient does not recall what type of lung cancer he had but reports that was 6 years ago he is s/p resection of part of the lung, he has been dependent on 2 L supplemental oxygen since then, he does not require any inhalers, there is history of remote smoking but does not recall diagnosis of COPD.  Will await old records to see if will need any further follow-up with oncology, today's exam within normal on 2 L supplemental oxygen

## 2022-04-06 NOTE — Assessment & Plan Note (Signed)
patient reports he has been compliant with dialysis 3 times a week, not sure if he established with nephrology through [facility] dialysis center or if he will need a new referral since he moved to the area however for now we will continue current medications and will await old records.  He does have a functional right arm AV fistula without any concerns

## 2022-04-06 NOTE — Assessment & Plan Note (Signed)
blood pressure is stable and well-controlled on current dose of Coreg, he was recently started on midodrine due to hypotensive episodes, will continue same since no longer experiencing dizzy spells.  He does not recall if he used to follow-up with cardiology in [place] however he does have heart murmur that is not new on exam.  Will continue current meds for now, encouraged to maintain low-salt diet

## 2022-04-06 NOTE — Assessment & Plan Note (Signed)
last lab results in the ER showing anemia, patient is currently on ferric citrate, will await until all previous records available, as noted above he is going to be established with nephrology and will follow along.  He will probably need to be on renal vitamins as well

## 2022-04-06 NOTE — Progress Notes (Signed)
ASSESSMENT/PLAN:  1. ESRD (end stage renal disease) on dialysis [facility])  Assessment & Plan:    patient reports he has been compliant with dialysis 3 times a week, not sure if he established with nephrology through [facility] dialysis center or if he will need a new referral since he moved to the area however for now we will continue current medications and will await old records.  He does have a functional right arm AV fistula without any concerns  2. Primary hypertension  Assessment & Plan:    blood pressure is stable and well-controlled on current dose of Coreg, he was recently started on midodrine due to hypotensive episodes, will continue same since no longer experiencing dizzy spells.  He does not recall if he used to follow-up with cardiology in [place] however he does have heart murmur that is not new on exam.  Will continue current meds for now, encouraged to maintain low-salt diet  3. Gastroesophageal reflux disease without esophagitis  Assessment & Plan:    patient currently denying any symptoms, will continue current dose of pantoprazole  4. Anemia of renal disease  Assessment & Plan:    last lab results in the ER showing anemia, patient is currently on ferric citrate, will await until all previous records available, as noted above he is going to be established with nephrology and will follow along.  He will probably need to be on renal vitamins as well  5. On supplemental oxygen by nasal cannula  6. Malignant neoplasm of right lung, unspecified part of lung [facility])  Assessment & Plan:    patient does not recall what type of lung cancer he had but reports that was 6 years ago he is s/p resection of part of the lung, he has been dependent on 2 L supplemental oxygen since then, he does not require any inhalers, there is history of remote smoking but does not recall diagnosis of COPD.  Will await old records to see if will need any further follow-up with oncology, today's exam within normal on 2 L  supplemental oxygen      No follow-ups on file.     SUBJECTIVE  HPI:   Patient here to establish new PCP office visits, no prior records in epic other than emergency room visit last month at [facility] ER  He is an 82 year old male with end-stage renal disease on hemodialysis, he recently moved from [facility] to [place] and then moved here to live with his son.  Wife passed away 2 weeks ago  He has history of lung cancer that was diagnosed 6 years ago and appears to be in remission since then but has been maintained on 2 L continuous oxygen following lung resection.  ER visit done recently due to hypotension, extreme fatigue, was found to have COVID infection, was started on midodrine to help with hypotension  He does ambulate using a walker, he has sustained weight loss recently        Review of Systems   Constitutional:  Positive for fatigue. Negative for activity change, appetite change and unexpected weight change.   HENT:  Negative for congestion, hearing loss, mouth sores and sore throat.    Eyes:  Negative for visual disturbance.   Respiratory:  Negative for cough, chest tightness, shortness of breath and wheezing.    Cardiovascular:  Negative for chest pain, palpitations and leg swelling.   Gastrointestinal:  Negative for abdominal pain, constipation, nausea and vomiting.   Genitourinary:  Negative for  difficulty urinating, dysuria, frequency, hematuria and urgency.   Musculoskeletal:  Negative for arthralgias, back pain, gait problem and joint swelling.   Skin:  Negative for color change.   Allergic/Immunologic: Negative for environmental allergies and immunocompromised state.   Neurological:  Negative for dizziness, speech difficulty, weakness, light-headedness and headaches.   Psychiatric/Behavioral:  Negative for behavioral problems, dysphoric mood and sleep disturbance. The patient is not nervous/anxious.        OBJECTIVE:    BP 120/78 (Site: Left Upper Arm, Position: Sitting,  Cuff Size: Medium Adult)   Pulse 84   Ht 1.778 m (5\' 10" )   Wt 51.3 kg (113 lb)   BMI 16.21 kg/m    Physical Exam  Vitals and nursing note reviewed.   Constitutional:       General: He is not in acute distress.     Appearance: He is not toxic-appearing.      Comments: Cachectic appearance and under weight   HENT:      Mouth/Throat:      Mouth: Mucous membranes are moist.      Pharynx: Oropharynx is clear. No oropharyngeal exudate.   Eyes:      Conjunctiva/sclera: Conjunctivae normal.   Cardiovascular:      Rate and Rhythm: Normal rate and regular rhythm.      Heart sounds: Murmur heard.   Pulmonary:      Effort: No respiratory distress.      Breath sounds: No wheezing.   Abdominal:      Palpations: Abdomen is soft.      Tenderness: There is no abdominal tenderness. There is no rebound.   Musculoskeletal:         General: No tenderness.      Cervical back: Neck supple.      Right lower leg: No edema.      Left lower leg: No edema.      Comments: AV shunt right upper extremity   Skin:     General: Skin is warm and dry.   Neurological:      Mental Status: Mental status is at baseline.      Motor: No weakness.      Gait: Gait normal.   Psychiatric:         Mood and Affect: Mood normal.         Behavior: Behavior normal.           Electronically signed by Lyman Speller, MD on 04/06/2022 at 2:58 PM.    This dictation was generated by voice recognition computer software.  Although all attempts are made to edit the dictation for accuracy, there may be errors in the transcription that are not intended.

## 2022-04-07 DIAGNOSIS — N186 End stage renal disease: Secondary | ICD-10-CM | POA: Diagnosis not present

## 2022-04-07 DIAGNOSIS — Z992 Dependence on renal dialysis: Secondary | ICD-10-CM | POA: Diagnosis not present

## 2022-04-09 DIAGNOSIS — Z992 Dependence on renal dialysis: Secondary | ICD-10-CM | POA: Diagnosis not present

## 2022-04-09 DIAGNOSIS — N186 End stage renal disease: Secondary | ICD-10-CM | POA: Diagnosis not present

## 2022-04-12 DIAGNOSIS — N186 End stage renal disease: Secondary | ICD-10-CM | POA: Diagnosis not present

## 2022-04-12 DIAGNOSIS — Z992 Dependence on renal dialysis: Secondary | ICD-10-CM | POA: Diagnosis not present

## 2022-04-14 DIAGNOSIS — Z992 Dependence on renal dialysis: Secondary | ICD-10-CM | POA: Diagnosis not present

## 2022-04-14 DIAGNOSIS — N186 End stage renal disease: Secondary | ICD-10-CM | POA: Diagnosis not present

## 2022-04-15 DIAGNOSIS — H401133 Primary open-angle glaucoma, bilateral, severe stage: Secondary | ICD-10-CM | POA: Diagnosis not present

## 2022-04-16 DIAGNOSIS — Z992 Dependence on renal dialysis: Secondary | ICD-10-CM | POA: Diagnosis not present

## 2022-04-16 DIAGNOSIS — N186 End stage renal disease: Secondary | ICD-10-CM | POA: Diagnosis not present

## 2022-04-19 DIAGNOSIS — N186 End stage renal disease: Secondary | ICD-10-CM | POA: Diagnosis not present

## 2022-04-19 DIAGNOSIS — Z992 Dependence on renal dialysis: Secondary | ICD-10-CM | POA: Diagnosis not present

## 2022-04-21 DIAGNOSIS — Z992 Dependence on renal dialysis: Secondary | ICD-10-CM | POA: Diagnosis not present

## 2022-04-21 DIAGNOSIS — N186 End stage renal disease: Secondary | ICD-10-CM | POA: Diagnosis not present

## 2022-04-23 DIAGNOSIS — Z992 Dependence on renal dialysis: Secondary | ICD-10-CM | POA: Diagnosis not present

## 2022-04-23 DIAGNOSIS — N186 End stage renal disease: Secondary | ICD-10-CM | POA: Diagnosis not present

## 2022-04-25 DIAGNOSIS — N186 End stage renal disease: Secondary | ICD-10-CM | POA: Diagnosis not present

## 2022-04-25 DIAGNOSIS — Z992 Dependence on renal dialysis: Secondary | ICD-10-CM | POA: Diagnosis not present

## 2022-04-28 DIAGNOSIS — Z992 Dependence on renal dialysis: Secondary | ICD-10-CM | POA: Diagnosis not present

## 2022-04-28 DIAGNOSIS — N186 End stage renal disease: Secondary | ICD-10-CM | POA: Diagnosis not present

## 2022-05-03 DIAGNOSIS — Z992 Dependence on renal dialysis: Secondary | ICD-10-CM | POA: Diagnosis not present

## 2022-05-03 DIAGNOSIS — N186 End stage renal disease: Secondary | ICD-10-CM | POA: Diagnosis not present

## 2022-05-05 DIAGNOSIS — N186 End stage renal disease: Secondary | ICD-10-CM | POA: Diagnosis not present

## 2022-05-05 DIAGNOSIS — Z992 Dependence on renal dialysis: Secondary | ICD-10-CM | POA: Diagnosis not present

## 2022-05-06 DIAGNOSIS — M79674 Pain in right toe(s): Secondary | ICD-10-CM | POA: Diagnosis not present

## 2022-05-06 DIAGNOSIS — G609 Hereditary and idiopathic neuropathy, unspecified: Secondary | ICD-10-CM | POA: Diagnosis not present

## 2022-05-06 DIAGNOSIS — Q809 Congenital ichthyosis, unspecified: Secondary | ICD-10-CM | POA: Diagnosis not present

## 2022-05-06 DIAGNOSIS — M79675 Pain in left toe(s): Secondary | ICD-10-CM | POA: Diagnosis not present

## 2022-05-06 DIAGNOSIS — I7389 Other specified peripheral vascular diseases: Secondary | ICD-10-CM | POA: Diagnosis not present

## 2022-05-06 DIAGNOSIS — D2372 Other benign neoplasm of skin of left lower limb, including hip: Secondary | ICD-10-CM | POA: Diagnosis not present

## 2022-05-06 DIAGNOSIS — D2371 Other benign neoplasm of skin of right lower limb, including hip: Secondary | ICD-10-CM | POA: Diagnosis not present

## 2022-05-06 DIAGNOSIS — R2681 Unsteadiness on feet: Secondary | ICD-10-CM | POA: Diagnosis not present

## 2022-05-06 DIAGNOSIS — L603 Nail dystrophy: Secondary | ICD-10-CM | POA: Diagnosis not present

## 2022-05-07 DIAGNOSIS — N186 End stage renal disease: Secondary | ICD-10-CM | POA: Diagnosis not present

## 2022-05-07 DIAGNOSIS — Z992 Dependence on renal dialysis: Secondary | ICD-10-CM | POA: Diagnosis not present

## 2022-05-10 DIAGNOSIS — Z992 Dependence on renal dialysis: Secondary | ICD-10-CM | POA: Diagnosis not present

## 2022-05-10 DIAGNOSIS — N186 End stage renal disease: Secondary | ICD-10-CM | POA: Diagnosis not present

## 2022-05-12 DIAGNOSIS — Z992 Dependence on renal dialysis: Secondary | ICD-10-CM | POA: Diagnosis not present

## 2022-05-12 DIAGNOSIS — N186 End stage renal disease: Secondary | ICD-10-CM | POA: Diagnosis not present

## 2022-05-14 DIAGNOSIS — N186 End stage renal disease: Secondary | ICD-10-CM | POA: Diagnosis not present

## 2022-05-14 DIAGNOSIS — Z992 Dependence on renal dialysis: Secondary | ICD-10-CM | POA: Diagnosis not present

## 2022-05-17 DIAGNOSIS — Z992 Dependence on renal dialysis: Secondary | ICD-10-CM | POA: Diagnosis not present

## 2022-05-17 DIAGNOSIS — N186 End stage renal disease: Secondary | ICD-10-CM | POA: Diagnosis not present

## 2022-05-19 DIAGNOSIS — Z992 Dependence on renal dialysis: Secondary | ICD-10-CM | POA: Diagnosis not present

## 2022-05-19 DIAGNOSIS — N186 End stage renal disease: Secondary | ICD-10-CM | POA: Diagnosis not present

## 2022-05-21 DIAGNOSIS — N186 End stage renal disease: Secondary | ICD-10-CM | POA: Diagnosis not present

## 2022-05-21 DIAGNOSIS — Z992 Dependence on renal dialysis: Secondary | ICD-10-CM | POA: Diagnosis not present

## 2022-05-24 DIAGNOSIS — Z992 Dependence on renal dialysis: Secondary | ICD-10-CM | POA: Diagnosis not present

## 2022-05-24 DIAGNOSIS — N186 End stage renal disease: Secondary | ICD-10-CM | POA: Diagnosis not present

## 2022-05-26 DIAGNOSIS — Z992 Dependence on renal dialysis: Secondary | ICD-10-CM | POA: Diagnosis not present

## 2022-05-26 DIAGNOSIS — N186 End stage renal disease: Secondary | ICD-10-CM | POA: Diagnosis not present

## 2022-05-28 DIAGNOSIS — Z992 Dependence on renal dialysis: Secondary | ICD-10-CM | POA: Diagnosis not present

## 2022-05-28 DIAGNOSIS — N186 End stage renal disease: Secondary | ICD-10-CM | POA: Diagnosis not present

## 2022-05-31 DIAGNOSIS — Z992 Dependence on renal dialysis: Secondary | ICD-10-CM | POA: Diagnosis not present

## 2022-05-31 DIAGNOSIS — N186 End stage renal disease: Secondary | ICD-10-CM | POA: Diagnosis not present

## 2022-06-02 DIAGNOSIS — Z992 Dependence on renal dialysis: Secondary | ICD-10-CM | POA: Diagnosis not present

## 2022-06-02 DIAGNOSIS — N186 End stage renal disease: Secondary | ICD-10-CM | POA: Diagnosis not present

## 2022-06-03 ENCOUNTER — Encounter: Payer: MEDICARE | Attending: Internal Medicine | Primary: Internal Medicine

## 2022-06-04 DIAGNOSIS — N186 End stage renal disease: Secondary | ICD-10-CM | POA: Diagnosis not present

## 2022-06-04 DIAGNOSIS — Z992 Dependence on renal dialysis: Secondary | ICD-10-CM | POA: Diagnosis not present

## 2022-06-07 DIAGNOSIS — N186 End stage renal disease: Secondary | ICD-10-CM | POA: Diagnosis not present

## 2022-06-07 DIAGNOSIS — Z992 Dependence on renal dialysis: Secondary | ICD-10-CM | POA: Diagnosis not present

## 2022-06-09 DIAGNOSIS — Z992 Dependence on renal dialysis: Secondary | ICD-10-CM | POA: Diagnosis not present

## 2022-06-09 DIAGNOSIS — N186 End stage renal disease: Secondary | ICD-10-CM | POA: Diagnosis not present

## 2022-06-11 DIAGNOSIS — Z992 Dependence on renal dialysis: Secondary | ICD-10-CM | POA: Diagnosis not present

## 2022-06-11 DIAGNOSIS — N186 End stage renal disease: Secondary | ICD-10-CM | POA: Diagnosis not present

## 2022-06-14 DIAGNOSIS — Z992 Dependence on renal dialysis: Secondary | ICD-10-CM | POA: Diagnosis not present

## 2022-06-14 DIAGNOSIS — N186 End stage renal disease: Secondary | ICD-10-CM | POA: Diagnosis not present

## 2022-06-16 DIAGNOSIS — N186 End stage renal disease: Secondary | ICD-10-CM | POA: Diagnosis not present

## 2022-06-16 DIAGNOSIS — Z992 Dependence on renal dialysis: Secondary | ICD-10-CM | POA: Diagnosis not present

## 2022-06-18 DIAGNOSIS — Z992 Dependence on renal dialysis: Secondary | ICD-10-CM | POA: Diagnosis not present

## 2022-06-18 DIAGNOSIS — N186 End stage renal disease: Secondary | ICD-10-CM | POA: Diagnosis not present

## 2022-06-21 DIAGNOSIS — Z992 Dependence on renal dialysis: Secondary | ICD-10-CM | POA: Diagnosis not present

## 2022-06-21 DIAGNOSIS — N186 End stage renal disease: Secondary | ICD-10-CM | POA: Diagnosis not present

## 2022-06-23 DIAGNOSIS — Z992 Dependence on renal dialysis: Secondary | ICD-10-CM | POA: Diagnosis not present

## 2022-06-23 DIAGNOSIS — N186 End stage renal disease: Secondary | ICD-10-CM | POA: Diagnosis not present

## 2022-06-25 DIAGNOSIS — N186 End stage renal disease: Secondary | ICD-10-CM | POA: Diagnosis not present

## 2022-06-25 DIAGNOSIS — Z992 Dependence on renal dialysis: Secondary | ICD-10-CM | POA: Diagnosis not present

## 2022-06-28 DIAGNOSIS — N186 End stage renal disease: Secondary | ICD-10-CM | POA: Diagnosis not present

## 2022-06-28 DIAGNOSIS — Z992 Dependence on renal dialysis: Secondary | ICD-10-CM | POA: Diagnosis not present

## 2022-06-30 DIAGNOSIS — N186 End stage renal disease: Secondary | ICD-10-CM | POA: Diagnosis not present

## 2022-06-30 DIAGNOSIS — Z992 Dependence on renal dialysis: Secondary | ICD-10-CM | POA: Diagnosis not present

## 2022-07-01 DIAGNOSIS — Z992 Dependence on renal dialysis: Secondary | ICD-10-CM | POA: Diagnosis not present

## 2022-07-01 DIAGNOSIS — T82510A Breakdown (mechanical) of surgically created arteriovenous fistula, initial encounter: Secondary | ICD-10-CM | POA: Diagnosis not present

## 2022-07-01 DIAGNOSIS — T82858A Stenosis of vascular prosthetic devices, implants and grafts, initial encounter: Secondary | ICD-10-CM | POA: Diagnosis not present

## 2022-07-01 DIAGNOSIS — N186 End stage renal disease: Secondary | ICD-10-CM | POA: Diagnosis not present

## 2022-07-01 DIAGNOSIS — T82898A Other specified complication of vascular prosthetic devices, implants and grafts, initial encounter: Secondary | ICD-10-CM | POA: Diagnosis not present

## 2022-07-01 DIAGNOSIS — I771 Stricture of artery: Secondary | ICD-10-CM | POA: Diagnosis not present

## 2022-07-02 DIAGNOSIS — Z992 Dependence on renal dialysis: Secondary | ICD-10-CM | POA: Diagnosis not present

## 2022-07-02 DIAGNOSIS — N186 End stage renal disease: Secondary | ICD-10-CM | POA: Diagnosis not present

## 2022-07-02 NOTE — Telephone Encounter (Signed)
LVM to call back to schedule Awv with PCP

## 2022-07-05 DIAGNOSIS — Z992 Dependence on renal dialysis: Secondary | ICD-10-CM | POA: Diagnosis not present

## 2022-07-05 DIAGNOSIS — N186 End stage renal disease: Secondary | ICD-10-CM | POA: Diagnosis not present

## 2022-07-07 DIAGNOSIS — Z992 Dependence on renal dialysis: Secondary | ICD-10-CM | POA: Diagnosis not present

## 2022-07-07 DIAGNOSIS — N186 End stage renal disease: Secondary | ICD-10-CM | POA: Diagnosis not present

## 2022-07-09 DIAGNOSIS — Z992 Dependence on renal dialysis: Secondary | ICD-10-CM | POA: Diagnosis not present

## 2022-07-09 DIAGNOSIS — N186 End stage renal disease: Secondary | ICD-10-CM | POA: Diagnosis not present

## 2022-07-12 DIAGNOSIS — Z992 Dependence on renal dialysis: Secondary | ICD-10-CM | POA: Diagnosis not present

## 2022-07-12 DIAGNOSIS — N186 End stage renal disease: Secondary | ICD-10-CM | POA: Diagnosis not present

## 2022-07-14 DIAGNOSIS — N186 End stage renal disease: Secondary | ICD-10-CM | POA: Diagnosis not present

## 2022-07-14 DIAGNOSIS — Z992 Dependence on renal dialysis: Secondary | ICD-10-CM | POA: Diagnosis not present

## 2022-07-16 DIAGNOSIS — Z992 Dependence on renal dialysis: Secondary | ICD-10-CM | POA: Diagnosis not present

## 2022-07-16 DIAGNOSIS — N186 End stage renal disease: Secondary | ICD-10-CM | POA: Diagnosis not present

## 2022-07-18 DIAGNOSIS — I5032 Chronic diastolic (congestive) heart failure: Secondary | ICD-10-CM | POA: Diagnosis not present

## 2022-07-18 DIAGNOSIS — N186 End stage renal disease: Secondary | ICD-10-CM | POA: Diagnosis not present

## 2022-07-18 DIAGNOSIS — C3492 Malignant neoplasm of unspecified part of left bronchus or lung: Secondary | ICD-10-CM | POA: Diagnosis not present

## 2022-07-19 DIAGNOSIS — Z992 Dependence on renal dialysis: Secondary | ICD-10-CM | POA: Diagnosis not present

## 2022-07-19 DIAGNOSIS — N186 End stage renal disease: Secondary | ICD-10-CM | POA: Diagnosis not present

## 2022-07-21 DIAGNOSIS — N186 End stage renal disease: Secondary | ICD-10-CM | POA: Diagnosis not present

## 2022-07-21 DIAGNOSIS — Z992 Dependence on renal dialysis: Secondary | ICD-10-CM | POA: Diagnosis not present

## 2022-07-23 DIAGNOSIS — N186 End stage renal disease: Secondary | ICD-10-CM | POA: Diagnosis not present

## 2022-07-23 DIAGNOSIS — Z992 Dependence on renal dialysis: Secondary | ICD-10-CM | POA: Diagnosis not present

## 2022-07-26 DIAGNOSIS — C3492 Malignant neoplasm of unspecified part of left bronchus or lung: Secondary | ICD-10-CM | POA: Diagnosis not present

## 2022-07-26 DIAGNOSIS — N186 End stage renal disease: Secondary | ICD-10-CM | POA: Diagnosis not present

## 2022-07-26 DIAGNOSIS — Z992 Dependence on renal dialysis: Secondary | ICD-10-CM | POA: Diagnosis not present

## 2022-07-26 DIAGNOSIS — I5032 Chronic diastolic (congestive) heart failure: Secondary | ICD-10-CM | POA: Diagnosis not present

## 2022-07-28 DIAGNOSIS — Z992 Dependence on renal dialysis: Secondary | ICD-10-CM | POA: Diagnosis not present

## 2022-07-28 DIAGNOSIS — N186 End stage renal disease: Secondary | ICD-10-CM | POA: Diagnosis not present

## 2022-07-30 ENCOUNTER — Emergency Department: Admit: 2022-07-31 | Payer: Medicare (Managed Care)

## 2022-07-30 ENCOUNTER — Inpatient Hospital Stay: Admission: EM | Admit: 2022-07-30 | Discharge: 2022-08-19 | Disposition: E | Payer: Medicare (Managed Care)

## 2022-07-30 ENCOUNTER — Emergency Department: Admit: 2022-07-30 | Payer: Medicare (Managed Care)

## 2022-07-30 DIAGNOSIS — R579 Shock, unspecified: Secondary | ICD-10-CM

## 2022-07-30 DIAGNOSIS — A419 Sepsis, unspecified organism: Secondary | ICD-10-CM

## 2022-07-30 DIAGNOSIS — J441 Chronic obstructive pulmonary disease with (acute) exacerbation: Secondary | ICD-10-CM | POA: Diagnosis not present

## 2022-07-30 DIAGNOSIS — N289 Disorder of kidney and ureter, unspecified: Secondary | ICD-10-CM | POA: Diagnosis not present

## 2022-07-30 DIAGNOSIS — E042 Nontoxic multinodular goiter: Secondary | ICD-10-CM | POA: Diagnosis not present

## 2022-07-30 DIAGNOSIS — K559 Vascular disorder of intestine, unspecified: Secondary | ICD-10-CM | POA: Diagnosis not present

## 2022-07-30 DIAGNOSIS — K769 Liver disease, unspecified: Secondary | ICD-10-CM | POA: Diagnosis not present

## 2022-07-30 DIAGNOSIS — I2510 Atherosclerotic heart disease of native coronary artery without angina pectoris: Secondary | ICD-10-CM | POA: Diagnosis not present

## 2022-07-30 DIAGNOSIS — I7772 Dissection of iliac artery: Secondary | ICD-10-CM | POA: Diagnosis not present

## 2022-07-30 DIAGNOSIS — G9341 Metabolic encephalopathy: Secondary | ICD-10-CM | POA: Diagnosis not present

## 2022-07-30 DIAGNOSIS — I959 Hypotension, unspecified: Secondary | ICD-10-CM | POA: Diagnosis not present

## 2022-07-30 DIAGNOSIS — Z681 Body mass index (BMI) 19 or less, adult: Secondary | ICD-10-CM | POA: Diagnosis not present

## 2022-07-30 DIAGNOSIS — N186 End stage renal disease: Secondary | ICD-10-CM | POA: Diagnosis not present

## 2022-07-30 DIAGNOSIS — M799 Soft tissue disorder, unspecified: Secondary | ICD-10-CM | POA: Diagnosis not present

## 2022-07-30 DIAGNOSIS — I771 Stricture of artery: Secondary | ICD-10-CM | POA: Diagnosis not present

## 2022-07-30 DIAGNOSIS — J189 Pneumonia, unspecified organism: Secondary | ICD-10-CM | POA: Diagnosis not present

## 2022-07-30 DIAGNOSIS — I700 Atherosclerosis of aorta: Secondary | ICD-10-CM | POA: Diagnosis not present

## 2022-07-30 DIAGNOSIS — I3481 Nonrheumatic mitral (valve) annulus calcification: Secondary | ICD-10-CM | POA: Diagnosis not present

## 2022-07-30 DIAGNOSIS — Z452 Encounter for adjustment and management of vascular access device: Secondary | ICD-10-CM | POA: Diagnosis not present

## 2022-07-30 DIAGNOSIS — I214 Non-ST elevation (NSTEMI) myocardial infarction: Secondary | ICD-10-CM | POA: Diagnosis not present

## 2022-07-30 DIAGNOSIS — R6521 Severe sepsis with septic shock: Secondary | ICD-10-CM | POA: Diagnosis not present

## 2022-07-30 DIAGNOSIS — C786 Secondary malignant neoplasm of retroperitoneum and peritoneum: Secondary | ICD-10-CM | POA: Diagnosis not present

## 2022-07-30 DIAGNOSIS — R531 Weakness: Secondary | ICD-10-CM | POA: Diagnosis not present

## 2022-07-30 DIAGNOSIS — I70201 Unspecified atherosclerosis of native arteries of extremities, right leg: Secondary | ICD-10-CM | POA: Diagnosis not present

## 2022-07-30 DIAGNOSIS — Z902 Acquired absence of lung [part of]: Secondary | ICD-10-CM | POA: Diagnosis not present

## 2022-07-30 DIAGNOSIS — J440 Chronic obstructive pulmonary disease with acute lower respiratory infection: Secondary | ICD-10-CM | POA: Diagnosis not present

## 2022-07-30 DIAGNOSIS — R188 Other ascites: Secondary | ICD-10-CM | POA: Diagnosis not present

## 2022-07-30 DIAGNOSIS — Z992 Dependence on renal dialysis: Secondary | ICD-10-CM | POA: Diagnosis not present

## 2022-07-30 DIAGNOSIS — R031 Nonspecific low blood-pressure reading: Secondary | ICD-10-CM | POA: Diagnosis not present

## 2022-07-30 DIAGNOSIS — R918 Other nonspecific abnormal finding of lung field: Secondary | ICD-10-CM | POA: Diagnosis not present

## 2022-07-30 DIAGNOSIS — I270 Primary pulmonary hypertension: Secondary | ICD-10-CM | POA: Diagnosis not present

## 2022-07-30 LAB — DIFFERENTIAL
Basophils Absolute: 20 /uL (ref 0–200)
Basophils Relative: 0.1 % (ref 0.0–1.0)
Eosinophils Absolute: 0 /uL — ABNORMAL LOW (ref 15–500)
Eosinophils Relative: 0 % (ref 0.0–8.0)
Lymphocytes Absolute: 702 /uL — ABNORMAL LOW (ref 850–3900)
Lymphocytes Relative: 3.6 % — ABNORMAL LOW (ref 15.0–45.0)
Monocytes Absolute: 702 /uL (ref 200–950)
Monocytes Relative: 3.6 % (ref 0.0–12.0)
Neutrophils Absolute: 18077 /uL — ABNORMAL HIGH (ref 1500–7800)
Neutrophils Relative: 92.7 % — ABNORMAL HIGH (ref 40.0–80.0)
nRBC: 0 /100{WBCs} (ref 0–0)

## 2022-07-30 LAB — CBC
Hematocrit: 22.8 % — ABNORMAL LOW (ref 38.5–50.0)
Hemoglobin: 7.3 g/dL — ABNORMAL LOW (ref 13.2–17.1)
MCH: 29 pg (ref 27.0–33.0)
MCHC: 32 g/dL (ref 32.0–36.0)
MCV: 90.9 fL (ref 80.0–100.0)
MPV: 7.5 fL (ref 7.5–11.5)
Platelets: 137 10*3/uL — ABNORMAL LOW (ref 140–400)
RBC: 2.51 10*6/uL — ABNORMAL LOW (ref 4.20–5.80)
RDW: 24.1 % — ABNORMAL HIGH (ref 11.0–15.0)
WBC: 19.5 10*3/uL — ABNORMAL HIGH (ref 3.8–10.8)

## 2022-07-30 LAB — HEPATIC FUNCTION PANEL
ALT: 321 U/L — ABNORMAL HIGH (ref 7–52)
ALT: 322 U/L — ABNORMAL HIGH (ref 7–52)
AST: 285 U/L — ABNORMAL HIGH (ref 13–39)
AST: 285 U/L — ABNORMAL HIGH (ref 13–39)
Albumin: 3.6 g/dL (ref 3.5–5.7)
Albumin: 3.6 g/dL (ref 3.5–5.7)
Alkaline Phosphatase: 85 U/L (ref 36–125)
Alkaline Phosphatase: 87 U/L (ref 36–125)
Bilirubin, Direct: 0.2 mg/dL (ref 0.0–0.4)
Bilirubin, Direct: 0.2 mg/dL (ref 0.0–0.4)
Bilirubin, Indirect: 0.7 mg/dL (ref 0.0–1.1)
Bilirubin, Indirect: 0.7 mg/dL (ref 0.0–1.1)
Total Bilirubin: 0.8 mg/dL (ref 0.0–1.5)
Total Bilirubin: 0.8 mg/dL (ref 0.0–1.5)
Total Protein: 6 g/dL — ABNORMAL LOW (ref 6.4–8.9)
Total Protein: 6 g/dL — ABNORMAL LOW (ref 6.4–8.9)

## 2022-07-30 LAB — BASIC METABOLIC PANEL
Anion Gap: 25 mmol/L — ABNORMAL HIGH (ref 3–16)
BUN: 41 mg/dL — ABNORMAL HIGH (ref 7–25)
CO2: 15 mmol/L — ABNORMAL LOW (ref 21–33)
Calcium: 9.1 mg/dL (ref 8.6–10.3)
Chloride: 99 mmol/L (ref 98–110)
Creatinine: 4.43 mg/dL — ABNORMAL HIGH (ref 0.60–1.30)
EGFR: 13
Glucose: 85 mg/dL (ref 70–100)
Osmolality, Calculated: 297 mOsm/kg (ref 278–305)
Potassium: 4 mmol/L (ref 3.5–5.3)
Sodium: 139 mmol/L (ref 133–146)

## 2022-07-30 LAB — PHOSPHORUS: Phosphorus: 5.1 mg/dL — ABNORMAL HIGH (ref 2.1–4.5)

## 2022-07-30 LAB — MAGNESIUM: Magnesium: 2.1 mg/dL (ref 1.5–2.5)

## 2022-07-30 LAB — BLOOD CULTURE-PERIPHERAL
Culture Result: NO GROWTH
Culture Result: NO GROWTH

## 2022-07-30 LAB — TSH: TSH: 9.76 u[IU]/mL — ABNORMAL HIGH (ref 0.45–4.12)

## 2022-07-30 LAB — T4, FREE: Free T4: 1.31 ng/dL (ref 0.61–1.76)

## 2022-07-30 LAB — HIGH SENSITIVITY TROPONIN: High Sensitivity Troponin: 321 ng/L — CR (ref 0–20)

## 2022-07-30 LAB — LACTIC ACID: Lactate: 11.9 mmol/L — ABNORMAL HIGH (ref 0.5–2.2)

## 2022-07-30 LAB — LIPASE: Lipase: 211 U/L — ABNORMAL HIGH (ref 4–82)

## 2022-07-30 MED ORDER — norepinephrine (LEVOPHED) 16 mg (0.064 mg/mL) in sodium chloride 0.9 % 250 mL infusion
16 | INTRAVENOUS
Start: 2022-07-30 — End: 2022-07-31
  Administered 2022-07-31: 04:00:00 via INTRAVENOUS

## 2022-07-30 MED ORDER — metroNIDAZOLE (FLAGYL) in sodium chloride, iso-osm IVPB 500 mg
500 | Freq: Three times a day (TID) | INTRAVENOUS
Start: 2022-07-30 — End: 2022-07-31
  Administered 2022-07-31 (×2): via INTRAVENOUS

## 2022-07-30 MED ORDER — sodium bicarbonate 8.4 % (1 mEq/mL) injection 50 mEq
8.4 | Freq: Once | INTRAVENOUS | Status: AC
Start: 2022-07-30 — End: 2022-07-31
  Administered 2022-07-31: 04:00:00 via INTRAVENOUS

## 2022-07-30 MED ORDER — sodium chloride 0.9 % IV infusion
INTRAVENOUS | Status: AC
Start: 2022-07-30 — End: 2022-07-31

## 2022-07-30 MED ORDER — PERIPHERAL norepinephrine (LEVOPHED) 16 mg (0.064 mg/mL) in sodium chloride 0.9 % 250 mL infusion
16 | INTRAVENOUS
Start: 2022-07-30 — End: 2022-07-30
  Administered 2022-07-31: 02:00:00 via INTRAVENOUS

## 2022-07-30 MED ORDER — electrolyte-R (pH 7.4) (NORMOSOL-R pH 7.4) IV bolus 500 mL
Freq: Once | INTRAVENOUS | Status: AC
Start: 2022-07-30 — End: 2022-07-30
  Administered 2022-07-31: 02:00:00 via INTRAVENOUS

## 2022-07-30 MED ORDER — lidocaine 10 mg/mL (1 %) injection
10 | INTRAMUSCULAR | Status: AC
Start: 2022-07-30 — End: 2022-07-30
  Administered 2022-07-31: 02:00:00

## 2022-07-30 MED ORDER — cefepime (MAXIPIME) 2 g in sodium chloride 0.9% 100 mL IVPB ADDaptor
Freq: Once | INTRAVENOUS | Status: AC
Start: 2022-07-30 — End: 2022-07-30
  Administered 2022-07-31: via INTRAVENOUS

## 2022-07-30 MED ORDER — electrolyte-R (pH 7.4) (NORMOSOL-R pH 7.4) IV bolus 1,000 mL
Freq: Once | INTRAVENOUS | Status: AC
Start: 2022-07-30 — End: 2022-07-31
  Administered 2022-07-31: 04:00:00 via INTRAVENOUS

## 2022-07-30 MED ORDER — electrolyte-R (pH 7.4) (NORMOSOL-R pH 7.4) IV solution
INTRAVENOUS
Start: 2022-07-30 — End: 2022-07-31
  Administered 2022-07-31 (×2): via INTRAVENOUS

## 2022-07-30 MED ORDER — vancomycin (VANCOCIN) 1,250 mg in sodium chloride 0.9 % 250 mL IVPB
Freq: Once | INTRAVENOUS | Status: AC
Start: 2022-07-30 — End: 2022-07-31
  Administered 2022-07-31: 03:00:00 via INTRAVENOUS

## 2022-07-30 MED ORDER — OMNIPAQUE (iohexol) 350 mg iodine/mL 100 mL
350 | Freq: Once | INTRAVENOUS | Status: AC | PRN
Start: 2022-07-30 — End: 2022-07-30
  Administered 2022-07-31: 01:00:00 via INTRAVENOUS

## 2022-07-30 MED ORDER — electrolyte-R (pH 7.4) (NORMOSOL-R pH 7.4) IV bolus 500 mL
Freq: Once | INTRAVENOUS | Status: AC
Start: 2022-07-30 — End: 2022-07-30
  Administered 2022-07-30: via INTRAVENOUS

## 2022-07-30 NOTE — ED Notes (Signed)
Bed: C20W  Expected date:   Expected time:   Means of arrival:   Comments:  [place] 56M, hypotension after dialysis, VSS

## 2022-07-30 NOTE — ED Provider Notes (Addendum)
 [facility] ED Note    Reason for Visit: Hypotension      Patient History     HPI:  Charles Drake is a 82 y.o. 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 Patient went to dialysis today from 0700 to 0900 and took 2 liters off. He reports this is a baseline volume to remove. He shares that he went home after dialysis and took a nap for a couple hours. He denies feeling any pain, but is experiencing global weakness. He does not recall any recent falls, sickness, or fevers. He notes that he is on blood pressure medications though he doesn't take them on days of dialysis. He takes one midodrine right before getting dialysis and one afterwards. He denies missing any doses of midodrine. He also denies coughing and vomiting, but some diarrhea which he states is not abnormal. Has daily diarrhea for years.     Of note, patient states that he did not go to the restroom yesterday as often as he usually does. Has not has a frequent of diarrhea. He does not produce urine organically.     No chest pain, no abdominal pain (initially, though developed abdominal discomfort shortly after initial presentation), no back pain, no trauma, no falls, no headache, no fever, no chills, no rashes, has chronically dry skin, no bloody or tarry stools, no hematemesis, no nosebleeds, no sick contacts.     Past Medical History:   Diagnosis Date    Aortic stenosis     Chronic hypotension     Chronic respiratory failure with hypoxia (CMS-HCC)     on 2L    ESRD on hemodialysis (CMS-HCC)     GI bleed     Lung cancer (CMS-HCC)     Paroxysmal A-fib (CMS-HCC)        Past Surgical History:   Procedure Laterality Date    ESOPHAGOGASTRODUODENOSCOPY      PROSTATE BIOPSY      THORACOSCOPY LOBECTOMY VIDEO ASSISTED      TONSILLECTOMY         Charles Drake  reports that he has quit smoking. His smoking use included cigarettes. He does not have any smokeless  tobacco history on file. No history on file for alcohol use and drug use.    There are no discharge medications for this patient.      Allergies:   Allergies as of 07/30/2022    (No Known Allergies)       Review of Systems     ROS: Positive as above. All other systems are reviewed and are negative.       Physical Exam     ED Triage Vitals [07/30/22 1859]   Enc Vitals Group      BP (!) 75/51      Heart Rate 93      Resp 23      Temp 96.1 F (35.6 C)      Temp Source Temporal      SpO2 100 %      Weight       Height 5' 10 (1.778 m)      Head Circumference       Peak Flow       Pain Score       Pain Loc       Pain Education       Exclude from Growth Chart  General: Patient appears cachectic.  HEENT: Head normocephalic atraumatic. Pupils are equally round and briskly reactive to light.  Extraocular muscles are intact.  Oral mucous membranes are slightly dry.   Neck: Supple.  Trachea midline. No cervical spine pain.  Pulmonary:  No increased work of breathing. Equal rise and fall the chest wall. No accessory muscle use.  Lungs are clear to auscultation bilaterally with good air movement.  No wheezes, rales or rhonchi.  Cardiac: Regular rate and rhythm.  No murmurs, rubs, or gallops.  Distal pulses are intact bilaterally. No lower extremity pitting edema.  Abdomen: Abdomen is soft, non-distended and mildly tender to palpation. Abdomen is non-peritoneal.  No rebound or guarding.  Musculoskeletal: Moving all extremities appropriately. Compartments soft. No edema. No signs of trauma.  Vascular: No cyanosis.    Skin: Very dry and flaky. Thickened skin lower extremities.  Neuro: Alert and oriented times to person, place, time and situation.  The patient has intact strength in all 4 extremities, though diffusely weak.  There are no deficits of the cranial nerves. Speech is fluent without dysarthria.  Psych:  Normal affect. Behavior appropriate.      Diagnostic Studies     Labs:    Labs Reviewed   BASIC METABOLIC PANEL -  Abnormal; Notable for the following components:       Result Value    CO2 15 (*)     Anion Gap 25 (*)     BUN 41 (*)     Creatinine 4.43 (*)     All other components within normal limits   CBC - Abnormal; Notable for the following components:    WBC 19.5 (*)     RBC 2.51 (*)     Hemoglobin 7.3 (*)     Hematocrit 22.8 (*)     RDW 24.1 (*)     Platelets 137 (*)     All other components within normal limits   DIFFERENTIAL - Abnormal; Notable for the following components:    Neutrophils Relative 92.7 (*)     Lymphocytes Relative 3.6 (*)     Neutrophils Absolute 18,077 (*)     Lymphocytes Absolute 702 (*)     Eosinophils Absolute 0 (*)     All other components within normal limits   HEPATIC FUNCTION PANEL - Abnormal; Notable for the following components:    AST 285 (*)     ALT 322 (*)     Total Protein 6.0 (*)     All other components within normal limits   LACTIC ACID - Abnormal; Notable for the following components:    Lactate 11.9 (*)     All other components within normal limits   HIGH SENSITIVITY TROPONIN - Abnormal; Notable for the following components:    High Sensitivity Troponin 321 (*)     All other components within normal limits   HIGH SENSITIVITY TROPONIN - Abnormal; Notable for the following components:    High Sensitivity Troponin 311 (*)     All other components within normal limits    Narrative:     Please draw 60 minutes after the time the first troponin is drawn.   TSH - Abnormal; Notable for the following components:    TSH 9.76 (*)     All other components within normal limits   PHOSPHORUS - Abnormal; Notable for the following components:    Phosphorus 5.1 (*)     All other components within normal limits   HEPATIC FUNCTION PANEL - Abnormal;  Notable for the following components:    AST 285 (*)     ALT 321 (*)     Total Protein 6.0 (*)     All other components within normal limits   LIPASE - Abnormal; Notable for the following components:    Lipase 211 (*)     All other components within normal limits    LACTIC ACID - Abnormal; Notable for the following components:    Lactate 11.5 (*)     All other components within normal limits   BASIC METABOLIC PANEL - Abnormal; Notable for the following components:    CO2 16 (*)     Anion Gap 22 (*)     BUN 38 (*)     Creatinine 3.50 (*)     Glucose 54 (*)     Calcium 6.2 (*)     All other components within normal limits   RENAL FUNCTION PANEL W/EGFR - Abnormal; Notable for the following components:    CO2 16 (*)     Anion Gap 26 (*)     BUN 41 (*)     Creatinine 3.82 (*)     Glucose 57 (*)     Calcium 6.8 (*)     Phosphorus 5.5 (*)     Albumin 2.5 (*)     All other components within normal limits   LACTIC ACID - Abnormal; Notable for the following components:    Lactate 9.8 (*)     All other components within normal limits   PROTIME-INR - Abnormal; Notable for the following components:    Protime 20.3 (*)     INR 1.7 (*)     All other components within normal limits   LACTIC ACID - Abnormal; Notable for the following components:    Lactate 13.1 (*)     All other components within normal limits   RENAL FUNCTION PANEL W/EGFR - Abnormal; Notable for the following components:    Chloride 96 (*)     CO2 17 (*)     Anion Gap 26 (*)     BUN 50 (*)     Creatinine 4.34 (*)     Glucose 124 (*)     Calcium 8.1 (*)     Phosphorus 4.9 (*)     Albumin 3.1 (*)     All other components within normal limits   CBC - Abnormal; Notable for the following components:    RBC 2.53 (*)     Hemoglobin 7.5 (*)     Hematocrit 22.9 (*)     RDW 25.6 (*)     Platelets 118 (*)     All other components within normal limits   FREE CALCIUM, WHOLE BLOOD - Abnormal; Notable for the following components:    Free Calcium, WB 3.89 (*)     All other components within normal limits   DIFFERENTIAL - Abnormal; Notable for the following components:    Neutrophils Relative 93.2 (*)     Lymphocytes Relative 3.7 (*)     nRBC 1 (*)     Neutrophils Absolute 10,345 (*)     Lymphocytes Absolute 411 (*)     All other components  within normal limits   POC GLU MONITORING DEVICE - Abnormal; Notable for the following components:    POC Glucose Monitoring Device 118 (*)     All other components within normal limits   POC GLU MONITORING DEVICE - Abnormal; Notable for the following components:    POC Glucose  Monitoring Device 121 (*)     All other components within normal limits   POC GLU MONITORING DEVICE - Abnormal; Notable for the following components:    POC Glucose Monitoring Device 125 (*)     All other components within normal limits   POC GLU MONITORING DEVICE - Abnormal; Notable for the following components:    POC Glucose Monitoring Device 121 (*)     All other components within normal limits   POC GLU MONITORING DEVICE - Abnormal; Notable for the following components:    POC Glucose Monitoring Device 124 (*)     All other components within normal limits   BLOOD CULTURE-PERIPHERAL    Narrative:     Suboptimal volume of blood received. Interpret results with caution.   BLOOD CULTURE-PERIPHERAL    Narrative:     Suboptimal volume of blood received. Interpret results with caution.   T4, FREE   MAGNESIUM   LACTIC ACID   HIGH SENSITIVITY TROPONIN   POC GLU MONITORING DEVICE   ABO/RH   ANTIBODY SCREEN   PREPARE RBC, LEUKOREDUCED       Radiology:    X-ray Portable Chest   Final Result   IMPRESSION:    Right internal jugular central venous catheter tip projects over the right atrium.      Approved by Vira Blanco, MD on Aug 06, 2022 12:16 AM EDT      I have personally reviewed the images and I agree with this report.      Report Verified by: Charlean Sanfilippo, MD at 08-06-22 12:27 AM EDT      Bedside focused transthoracic echocardiogram   Final Result      CT Abdomen and Pelvis With IV contrast   Final Result   IMPRESSION:      Chest   1.  Right lower lobe tree-in-bud nodularity, likely infectious or inflammatory including aspiration.   2.  Right upper lobectomy and right middle lobe wedge resection with irregular opacity along the lateral margin of  the right middle lobe/resection. Favor postoperative however recommend correlation with outside prior imaging.   3.  Findings suggestive of gastroesophageal reflux.   4.  Scattered lucent osseous lesions concerning for metastases given abdominal findings.      Abdomen and Pelvis   1.  Nonenhancement of the thickened colonic wall extending from the mid transverse colon to the rectum, highly concerning for bowel ischemia. Extensive atherosclerosis with eccentric noncalcified infrarenal abdominal aorta filling defect which could represent mural thrombus and a potential source.   2.  Multiple heterogeneously enhancing bilateral renal lesions concerning for malignancy with differential to include primary renal neoplasms versus multiple metastases.   3.  Multifocal large areas of ill-defined right hepatic lobe hypoenhancement with additional subcentimeter hypodense lesions. Given additional findings in the abdomen and pelvis, these are suspicious for hepatic metastases.   4.  Patchy mesenteric and omental attenuation including a nondependent portions of the abdomen raise concern for peritoneal carcinomatosis versus patchy mesenteric edema related to the bowel ischemia.   5.  Numerous thoracolumbar spine and bilateral pelvic osteolytic lesions suspicious for osseous metastases.   6.  Patchy splenic hypoattenuation is most likely related to contrast phase timing. Metastases considered less likely.   7.  Findings discussed above concerning for multifocal malignancy should be compared to the outside prior imaging.   8.  Patchy renal cortical hypoenhancement bilaterally may relate to ATN or infarcts.   9.  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   10.  Right inguinal canal soft tissue density, suspect retractile testicle. Associated 9 mm hyperdensity could represent a testicular lesion. Recommend correlation with sonographic evaluation.   11.   Large colonic stool burden with 7 cm rectal stool ball.         CRITICAL RESULT: Bowel ischemia.  This finding was discussed with Shela Leff, MD on  07/30/2022 10:27 PM EDT by telephone. They confirmed that they understood the findings communicated to them.  #[id number]#   Approved by Inda Merlin, MD on 07/30/2022 10:28 PM EDT      I have personally reviewed the images and I agree with this report.      Report Verified by: Melodye Ped, MD at 07/30/2022 10:39 PM EDT      CT Chest With IV contrast   Final Result   IMPRESSION:      Chest   1.  Right lower lobe tree-in-bud nodularity, likely infectious or inflammatory including aspiration.   2.  Right upper lobectomy and right middle lobe wedge resection with irregular opacity along the lateral margin of the right middle lobe/resection. Favor postoperative however recommend correlation with outside prior imaging.   3.  Findings suggestive of gastroesophageal reflux.   4.  Scattered lucent osseous lesions concerning for metastases given abdominal findings.      Abdomen and Pelvis   1.  Nonenhancement of the thickened colonic wall extending from the mid transverse colon to the rectum, highly concerning for bowel ischemia. Extensive atherosclerosis with eccentric noncalcified infrarenal abdominal aorta filling defect which could represent mural thrombus and a potential source.   2.  Multiple heterogeneously enhancing bilateral renal lesions concerning for malignancy with differential to include primary renal neoplasms versus multiple metastases.   3.  Multifocal large areas of ill-defined right hepatic lobe hypoenhancement with additional subcentimeter hypodense lesions. Given additional findings in the abdomen and pelvis, these are suspicious for hepatic metastases.   4.  Patchy mesenteric and omental attenuation including a nondependent portions of the abdomen raise concern for peritoneal carcinomatosis versus patchy mesenteric edema related to the bowel ischemia.   5.   Numerous thoracolumbar spine and bilateral pelvic osteolytic lesions suspicious for osseous metastases.   6.  Patchy splenic hypoattenuation is most likely related to contrast phase timing. Metastases considered less likely.   7.  Findings discussed above concerning for multifocal malignancy should be compared to the outside prior imaging.   8.  Patchy renal cortical hypoenhancement bilaterally may relate to ATN or infarcts.   9.  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   10.  Right inguinal canal soft tissue density, suspect retractile testicle. Associated 9 mm hyperdensity could represent a testicular lesion. Recommend correlation with sonographic evaluation.   11.  Large colonic stool burden with 7 cm rectal stool ball.         CRITICAL RESULT: Bowel ischemia.  This finding was discussed with Shela Leff, MD on  07/30/2022 10:27 PM EDT by telephone. They confirmed that they understood the findings communicated to them.  #[id number]#   Approved by Inda Merlin, MD on 07/30/2022 10:28 PM EDT      I have personally reviewed the images and I agree with this report.      Report Verified by: Melodye Ped, MD at 07/30/2022 10:39 PM EDT      X-ray Portable Chest   Final Result   IMPRESSION:    No  acute cardiopulmonary abnormality.      Report Verified by: Melodye Ped, MD at 07/30/2022 8:21 PM EDT      Bedside focused transthoracic echocardiogram   Final Result          EKG:  First EKG : 18:55 : Rate 92, RBBB, wide QRS, deep T wave inversions in the precordial leads. Difficult to read secondary to artifact. No comparison available.     Second EKG: 20:05 : Rate 90, RBBB, QRS 124 ms, QTc: 516 ms, ST segment depression precordial leads    Emergency Department Procedures         ED Course and MDM     In summary, Charles Drake is a 82 y.o. male who presented to the emergency department with hypotension. He went to dialysis today and though he usually feels  weak after dialysis he never feels this weak.  He called 911 and the paramedics found him to be hypotensive.  They started IV fluids on him.  At arrival patient had received about 800 mL of IV fluids.  The rest of the back was instilled.  Patient remained hypotensive.  On exam his mental status is normal.  He denies pain anywhere.  His family arrived shortly and was able to provide collateral.  Patient is not from this area, but has lived in [place] for the last 6 months.  Originally from [place], [place] and then [employer] [place].  Began living with his son last year.  They report that he has not been ill or having any complaints recently.  He does not need a hospital frequently.  I have no available records for the patient.  Family comes with orders for full code which were signed in [place], I discussed this with patient and family and patient would like to continue to be a full code at this early point in his ED stay.    Fluid resuscitation was continued.  A broad workup was initiated.  Bedside ultrasound exam was performed which was limited secondary to patient's COPD however demonstrates a severely hyperdynamic LV and a very flat IVC.  Patient was given an additional liter of fluids.  He was started on broad-spectrum antibiotics due to concern that he may have sepsis as the cause of his shock, unknown source and no other infection type symptoms.  After fluid bolus patient was reevaluated and continues to have hypotension, normal mental status, continues to have impaired tissue perfusion, additional IV fluids ordered.    Patient began complaining of abdominal pain.  He was sent for CT chest abdomen and pelvis to elucidate cause of hypotension.  His laboratory analyses began elucidating some of the depth of his illness.  He has a anion gap acidosis, low bicarb, presumably lactic acidosis due to his lactate of 11.  His labs also demonstrate elevated creatinine and BUN consistent with end-stage renal  disease, no significant deviation of his potassium which is reassuring.  His hemoglobin and hematocrit are low at 7.5 and 22.9.  Unable to compare these to his recent labs from dialysis which were around 8 and 23.  No reported bleeding, has not required transfusion in the past, has not received transfusion at dialysis recently.  His white blood cell count is elevated thus I am more concerned about source of infection, again covered with antibiotics and treating for presumed sepsis at this point.    EKG demonstrates deep T wave inversions, none available for comparison.  His first high-sensitivity troponin is elevated, second is downtrending though  still elevated.  Given his low H&H and hypotension, I believe that his troponinemia secondary to demand, I believe that heparinizing currently will cause more harm than good.     I reviewed the CT scan myself and see a very distended colon and questionable signs of poor perfusion to the colon.  I spoke with radiology and they are also concerned about mesenteric ischemia. His CT scan demonstrates findings concerning for metastatic cancer, unknown primary though possible renal in origin, also possible lung.  Also concerning only on his CT scans, or findings concerning for mesenteric ischemia.  I consulted Dr. Roselind Rily.  Dr. Betsy Pries recommends 1 more L of normal saline.  Repeat cardiac ultrasound was performed in order to evaluate inability to tolerate lower EXTR.  He continues to have a collapsible IVC.  LV is no longer hyperdynamic as it previously was.  Normal LV function.        07/30/22,18:00 I personally reassessed the patient's tissue perfusion after IVF bolus was administered. 1 more L of fluid was given.  He was started on Levophed for his hypotension.  A central line was placed by Dr. Reita May.  Patient's family was updated regarding these findings.  They are appropriately very upset.  Dr. Margarette Asal has had an extensive conversation with the family and patient about options  for surgery and at this time patient and family decline surgery.  They have elected to no longer be full code, he would still like to be intubated if necessary.    Patient was discussed with the intensivist who recommended starting vasopressin and giving stress dose steroids.  This was done.  Patient was discussed with Dr. Excell Seltzer the hospitalist who accepted the patient to her service.         Medical Decision Making  Problems Addressed:  Mesenteric ischemia (CMS-HCC): complicated acute illness or injury  Shock (CMS-HCC): complicated acute illness or injury    Amount and/or Complexity of Data Reviewed  Independent Historian:      Details: children  External Data Reviewed: notes.  Labs: ordered.     Details: From dialysis  Radiology: ordered and independent interpretation performed.  ECG/medicine tests: ordered and independent interpretation performed.  Discussion of management or test interpretation with external provider(s): ICU, medicine, surgery    Risk  Prescription drug management.  Parenteral controlled substances.  Decision regarding hospitalization.  Decision not to resuscitate or to de-escalate care because of poor prognosis.          SCRIBE ATTESTATION  By signing my name below, I, Trevor Iha , attest that this documentation has been prepared under the direction and in the presence of Shela Leff, MD  Scribe name: Trevor Iha Date: 27-Aug-2022  ----  Attending Physician Attestation  The scribes documentation has been prepared under my direction and personally reviewed by me in its entirety. I confirm that the note above accurately reflects all work, treatment, procedures, and medical decision making performed by me.     CRITICAL CARE TIME    I have seen and examined this patient and provided 90 minutes of critical care time exclusive of separately billed procedures and treating other patients.  Critical care was necessary to treat or prevent imminent or life threatening deterioration of the following  condition(s): circulatory failure, metabolic crisis, renal failure, and shock    Critical care time was spent personally by me on the following activities: discussions with consultants, evaluation of patient's resonse to treatment, ordering and performing treatments and interventions, ordering and review of  laboratory studies, ordering and review of radiographic studies, re-evaluation of patient's condition, and review of old charts            Caleb Popp, MD  08-27-22 [id number]       Caleb Popp, MD  08/11/22 [phone]

## 2022-07-30 NOTE — ED Notes (Signed)
Dr. Olegario Shearer at bedside. Aware of monitor reading pt's HR anywhere frmo 70-170's. Aware of pt's hypotension. Pt receiving fluids at this time. Awake and alert. C/o abd pain - Dr. Olegario Shearer aware.

## 2022-07-30 NOTE — ED Triage Notes (Signed)
Pt arrived to ED via SQ from home for C/o hypotension after HD. EMS stated SBP in low 100 1st time and 2nd time SBP in 80's.  Pt arrived on 2L O2 which per Pt baseline is 2L

## 2022-07-30 NOTE — ED Notes (Signed)
Pt to CT

## 2022-07-30 NOTE — ED Notes (Signed)
Bed: TR2W  Expected date:   Expected time:   Means of arrival:   Comments:  Room C20

## 2022-07-30 NOTE — ED Notes (Signed)
MD at bedside. 

## 2022-07-30 NOTE — ED Notes (Signed)
Dr. Margarette Asal at bedside to see and examine pt.

## 2022-07-31 ENCOUNTER — Inpatient Hospital Stay: Admit: 2022-07-31 | Payer: Medicare (Managed Care)

## 2022-07-31 ENCOUNTER — Emergency Department: Admit: 2022-07-31 | Payer: Medicare (Managed Care)

## 2022-07-31 LAB — BASIC METABOLIC PANEL
Anion Gap: 22 mmol/L — ABNORMAL HIGH (ref 3–16)
BUN: 38 mg/dL — ABNORMAL HIGH (ref 7–25)
CO2: 16 mmol/L — ABNORMAL LOW (ref 21–33)
Calcium: 6.2 mg/dL — CL (ref 8.6–10.3)
Chloride: 101 mmol/L (ref 98–110)
Creatinine: 3.5 mg/dL — ABNORMAL HIGH (ref 0.60–1.30)
EGFR: 17
Glucose: 54 mg/dL — ABNORMAL LOW (ref 70–100)
Osmolality, Calculated: 295 mOsm/kg (ref 278–305)
Potassium: 3.5 mmol/L (ref 3.5–5.3)
Sodium: 139 mmol/L (ref 133–146)

## 2022-07-31 LAB — CBC
Hematocrit: 22.9 % — ABNORMAL LOW (ref 38.5–50.0)
Hemoglobin: 7.5 g/dL — ABNORMAL LOW (ref 13.2–17.1)
MCH: 29.6 pg (ref 27.0–33.0)
MCHC: 32.7 g/dL (ref 32.0–36.0)
MCV: 90.7 fL (ref 80.0–100.0)
MPV: 7.5 fL (ref 7.5–11.5)
Platelets: 118 10*3/uL — ABNORMAL LOW (ref 140–400)
RBC: 2.53 10*6/uL — ABNORMAL LOW (ref 4.20–5.80)
RDW: 25.6 % — ABNORMAL HIGH (ref 11.0–15.0)
WBC: 10.8 10*3/uL (ref 3.8–10.8)

## 2022-07-31 LAB — DIFFERENTIAL
Basophils Absolute: 11 /uL (ref 0–200)
Basophils Relative: 0.1 % (ref 0.0–1.0)
Eosinophils Absolute: 22 /uL (ref 15–500)
Eosinophils Relative: 0.2 % (ref 0.0–8.0)
Lymphocytes Absolute: 411 /uL — ABNORMAL LOW (ref 850–3900)
Lymphocytes Relative: 3.7 % — ABNORMAL LOW (ref 15.0–45.0)
Monocytes Absolute: 311 /uL (ref 200–950)
Monocytes Relative: 2.8 % (ref 0.0–12.0)
Neutrophils Absolute: 10345 /uL — ABNORMAL HIGH (ref 1500–7800)
Neutrophils Relative: 93.2 % — ABNORMAL HIGH (ref 40.0–80.0)
nRBC: 1 /100 WBC — ABNORMAL HIGH (ref 0–0)

## 2022-07-31 LAB — POC GLU MONITORING DEVICE
POC Glucose Monitoring Device: 118 mg/dL — ABNORMAL HIGH (ref 70–100)
POC Glucose Monitoring Device: 76 mg/dL (ref 70–100)
POC Glucose Monitoring Device: 121 mg/dL — ABNORMAL HIGH (ref 70–100)
POC Glucose Monitoring Device: 125 mg/dL — ABNORMAL HIGH (ref 70–100)
POC Glucose Monitoring Device: 121 mg/dL — ABNORMAL HIGH (ref 70–100)
POC Glucose Monitoring Device: 124 mg/dL — ABNORMAL HIGH (ref 70–100)
POC Glucose Monitoring Device: 117 mg/dL — ABNORMAL HIGH (ref 70–100)
POC Glucose Monitoring Device: 81 mg/dL (ref 70–100)
POC Glucose Monitoring Device: 322 mg/dL — ABNORMAL HIGH (ref 70–100)
POC Glucose Monitoring Device: 51 mg/dL — ABNORMAL LOW (ref 70–100)
POC Glucose Monitoring Device: 53 mg/dL — ABNORMAL LOW (ref 70–100)

## 2022-07-31 LAB — BLOOD GAS, ARTERIAL
%HBO2, Arterial: 97 % (ref 95.0–98.0)
Carboxyhemoglobin, Arterial: 0 %
FIO2: 73
Methemoglobin, Arterial: 0 % (ref 0.0–1.5)
O2 Sat, Arterial: 97
Reduced hemoglobin, Arterial: 3 % (ref 0.0–5.0)
pCO2, Arterial: 49 mm Hg — ABNORMAL HIGH (ref 35–45)
pH, Arterial: <7 — CL (ref 7.35–7.45)
pO2, Arterial: 113 mm Hg — ABNORMAL HIGH (ref 80–100)

## 2022-07-31 LAB — MRSA/STAPH AUREUS DNA - DIAGNOSTIC TESTING FOR PNEUMONIA
MRSA, PCR: NEGATIVE
Staph Aureus, PCR: POSITIVE — AB

## 2022-07-31 LAB — RENAL FUNCTION PANEL W/EGFR
Albumin: 2.5 g/dL — ABNORMAL LOW (ref 3.5–5.7)
Albumin: 3.1 g/dL — ABNORMAL LOW (ref 3.5–5.7)
Anion Gap: 26 mmol/L — ABNORMAL HIGH (ref 3–16)
Anion Gap: 26 mmol/L — ABNORMAL HIGH (ref 3–16)
BUN: 41 mg/dL — ABNORMAL HIGH (ref 7–25)
BUN: 50 mg/dL — ABNORMAL HIGH (ref 7–25)
CO2: 16 mmol/L — ABNORMAL LOW (ref 21–33)
CO2: 17 mmol/L — ABNORMAL LOW (ref 21–33)
Calcium: 6.8 mg/dL — ABNORMAL LOW (ref 8.6–10.3)
Calcium: 8.1 mg/dL — ABNORMAL LOW (ref 8.6–10.3)
Chloride: 98 mmol/L (ref 98–110)
Chloride: 96 mmol/L — ABNORMAL LOW (ref 98–110)
Creatinine: 3.82 mg/dL — ABNORMAL HIGH (ref 0.60–1.30)
Creatinine: 4.34 mg/dL — ABNORMAL HIGH (ref 0.60–1.30)
EGFR: 15
EGFR: 13
Glucose: 57 mg/dL — ABNORMAL LOW (ref 70–100)
Glucose: 124 mg/dL — ABNORMAL HIGH (ref 70–100)
Osmolality, Calculated: 298 mOsm/kg (ref 278–305)
Osmolality, Calculated: 303 mOsm/kg (ref 278–305)
Phosphorus: 5.5 mg/dL — ABNORMAL HIGH (ref 2.1–4.5)
Phosphorus: 4.9 mg/dL — ABNORMAL HIGH (ref 2.1–4.5)
Potassium: 3.8 mmol/L (ref 3.5–5.3)
Potassium: 4.3 mmol/L (ref 3.5–5.3)
Sodium: 140 mmol/L (ref 133–146)
Sodium: 139 mmol/L (ref 133–146)

## 2022-07-31 LAB — FREE CALCIUM, WHOLE BLOOD: Free Calcium, WB: 3.89 mg/dL — ABNORMAL LOW (ref 4.50–5.30)

## 2022-07-31 LAB — PROTIME-INR
INR: 1.7 — ABNORMAL HIGH (ref 0.9–1.1)
Protime: 20.3 seconds — ABNORMAL HIGH (ref 12.1–15.1)

## 2022-07-31 LAB — LACTIC ACID
Lactate: 11.5 mmol/L — ABNORMAL HIGH (ref 0.5–2.2)
Lactate: 9.8 mmol/L — ABNORMAL HIGH (ref 0.5–2.2)
Lactate: 13.1 mmol/L — ABNORMAL HIGH (ref 0.5–2.2)
Lactate: 19.6 mmol/L — ABNORMAL HIGH (ref 0.5–2.2)

## 2022-07-31 LAB — ANTIBODY SCREEN: Antibody Screen: NEGATIVE

## 2022-07-31 LAB — HIGH SENSITIVITY TROPONIN
High Sensitivity Troponin: 311 ng/L — CR (ref 0–20)
High Sensitivity Troponin: 2033 ng/L — CR (ref 0–20)

## 2022-07-31 LAB — ABO/RH
Rh Type: POSITIVE
Rh Type: POSITIVE

## 2022-07-31 MED ORDER — sodium bicarbonate 8.4 % (1 mEq/mL) injection 50 mEq
8.4 | Freq: Once | INTRAVENOUS | Status: AC
Start: 2022-07-31 — End: 2022-07-31
  Administered 2022-07-31: 18:00:00 via INTRAVENOUS

## 2022-07-31 MED ORDER — morphine injection 5 mg
10 | INTRAVENOUS | PRN
Start: 2022-07-31 — End: 2022-07-31
  Administered 2022-07-31: 20:00:00 via INTRAVENOUS

## 2022-07-31 MED ORDER — vasopressin (VASOSTRICT) 40 Units in sodium chloride 0.9 % 100 mL infusion
INTRAVENOUS
Start: 2022-07-31 — End: 2022-07-31
  Administered 2022-07-31: 08:00:00 via INTRAVENOUS

## 2022-07-31 MED ORDER — cefepime (MAXIPIME) 1 g in sodium chloride 0.9% 100 mL IVPB ADDaptor
1 | INTRAMUSCULAR
Start: 2022-07-31 — End: 2022-07-31

## 2022-07-31 MED ORDER — dextrose 10%-water (D10W) IV soln
INTRAVENOUS | PRN
Start: 2022-07-31 — End: 2022-07-31
  Administered 2022-07-31: 19:00:00 via INTRAVENOUS

## 2022-07-31 MED ORDER — sodium chloride 0.9 % IV infusion
INTRAVENOUS
Start: 2022-07-31 — End: 2022-07-31
  Administered 2022-07-31: 16:00:00 via INTRAVENOUS

## 2022-07-31 MED ORDER — acetaminophen (TYLENOL) oral solution Soln 650 mg
650 | ORAL | PRN
Start: 2022-07-31 — End: 2022-07-31

## 2022-07-31 MED ORDER — glucose chewable tablet 12 g
4 | ORAL | PRN
Start: 2022-07-31 — End: 2022-07-31

## 2022-07-31 MED ORDER — midazolam (PF) (VERSED) injection 5 mg
1 | INTRAMUSCULAR | PRN
Start: 2022-07-31 — End: 2022-07-31

## 2022-07-31 MED ORDER — HYDROmorphone (DILAUDID) injection Syrg 1 mg
1 | INTRAMUSCULAR | PRN
Start: 2022-07-31 — End: 2022-07-31

## 2022-07-31 MED ORDER — norepinephrine (LEVOPHED) 16 mg (0.064 mg/mL) in sodium chloride 0.9 % 250 mL infusion
16 | INTRAVENOUS
Start: 2022-07-31 — End: 2022-07-31
  Administered 2022-07-31 (×2): via INTRAVENOUS

## 2022-07-31 MED ORDER — HYDROmorphone (DILAUDID) injection 0.5 mg
0.5 | INTRAMUSCULAR | PRN
Start: 2022-07-31 — End: 2022-07-31
  Administered 2022-07-31 (×2): via INTRAVENOUS

## 2022-07-31 MED ORDER — bisacodyL (DULCOLAX) suppository 10 mg
10 | Freq: Every day | RECTAL | PRN
Start: 2022-07-31 — End: 2022-07-31

## 2022-07-31 MED ORDER — dextrose 10%-water (D10W) IV soln
INTRAVENOUS | PRN
Start: 2022-07-31 — End: 2022-07-31

## 2022-07-31 MED ORDER — sodium chloride 0.9 % IV infusion
INTRAVENOUS | Status: AC
Start: 2022-07-31 — End: 2022-07-31
  Administered 2022-07-31: 11:00:00

## 2022-07-31 MED ORDER — dextrose 50 % in water (D50W) iv Syrg
INTRAVENOUS | Status: AC
Start: 2022-07-31 — End: 2022-07-31
  Administered 2022-07-31: 07:00:00

## 2022-07-31 MED ORDER — midazolam (PF) (VERSED) injection 2 mg
1 | INTRAMUSCULAR | PRN
Start: 2022-07-31 — End: 2022-07-31

## 2022-07-31 MED ORDER — heparin (porcine) injection 5,000 Units
5000 | Freq: Three times a day (TID) | INTRAMUSCULAR
Start: 2022-07-31 — End: 2022-07-31
  Administered 2022-07-31 (×2): via SUBCUTANEOUS

## 2022-07-31 MED ORDER — fentaNYL (SUBLIMAZE) injection 25 mcg
50 | Freq: Once | INTRAMUSCULAR | Status: AC
Start: 2022-07-31 — End: 2022-07-31
  Administered 2022-07-31: 04:00:00 via INTRAVENOUS

## 2022-07-31 MED ORDER — pantoprazole (PROTONIX) injection 40 mg
40 | Freq: Every day | INTRAVENOUS
Start: 2022-07-31 — End: 2022-07-31
  Administered 2022-07-31: 08:00:00 via INTRAVENOUS

## 2022-07-31 MED ORDER — sodium bicarbonate 8.4 % (1 mEq/mL) injection 50 mEq
8.4 | Freq: Once | INTRAVENOUS | Status: AC
Start: 2022-07-31 — End: 2022-07-31
  Administered 2022-07-31: 20:00:00 via INTRAVENOUS

## 2022-07-31 MED ORDER — hydrocortisone sod succ (PF) (SOLU-CORTEF) injection 50 mg
100 | Freq: Four times a day (QID) | INTRAMUSCULAR
Start: 2022-07-31 — End: 2022-07-31
  Administered 2022-07-31 (×2): via INTRAVENOUS

## 2022-07-31 MED ORDER — sodium bicarbonate 150 mEq in dextrose 5% in water (D5W) 1,000 mL IV infusion
1 | INTRAVENOUS
Start: 2022-07-31 — End: 2022-07-31
  Administered 2022-07-31: 19:00:00 via INTRAVENOUS

## 2022-07-31 MED ORDER — ipratropium-albuteroL (DUO-NEB) 0.5 mg-3 mg(2.5 mg base)/3 mL nebulizer solution 3 mL
0.5 | RESPIRATORY_TRACT
Start: 2022-07-31 — End: 2022-07-31
  Administered 2022-07-31 (×2): via RESPIRATORY_TRACT

## 2022-07-31 MED ORDER — dextrose 20 % (D20W) iv infusion 500 mL
20 | INTRAVENOUS
Start: 2022-07-31 — End: 2022-07-31
  Administered 2022-07-31: 08:00:00 via INTRAVENOUS

## 2022-07-31 MED ORDER — morphine injection 10 mg
10 | INTRAVENOUS | PRN
Start: 2022-07-31 — End: 2022-07-31

## 2022-07-31 MED ORDER — proMETHazine (PHENERGAN) suppository 12.5 mg
12.5 | RECTAL | PRN
Start: 2022-07-31 — End: 2022-07-31

## 2022-07-31 MED ORDER — ondansetron (ZOFRAN) injection 4 mg
4 | INTRAMUSCULAR | PRN
Start: 2022-07-31 — End: 2022-07-31

## 2022-07-31 MED ORDER — perflutren (OPTISON) Susp 0.66 mg
0.22 | Freq: Once | INTRAVENOUS
Start: 2022-07-31 — End: 2022-07-31

## 2022-07-31 MED ORDER — fentaNYL (SUBLIMAZE) injection 50 mcg
50 | Freq: Once | INTRAMUSCULAR | Status: AC
Start: 2022-07-31 — End: 2022-07-31
  Administered 2022-07-31: 06:00:00 via INTRAVENOUS

## 2022-07-31 MED ORDER — hydrocortisone sod succ (PF) (SOLU-CORTEF) injection 100 mg
100 | Freq: Once | INTRAMUSCULAR | Status: AC
Start: 2022-07-31 — End: 2022-07-31
  Administered 2022-07-31: 05:00:00 via INTRAVENOUS

## 2022-07-31 MED ORDER — sodium bicarbonate 8.4 % (1 mEq/mL) injection 50 mEq
8.4 | Freq: Once | INTRAVENOUS | Status: AC
Start: 2022-07-31 — End: 2022-07-31
  Administered 2022-07-31: 13:00:00 via INTRAVENOUS

## 2022-07-31 MED FILL — NORMOSOL-R PH 7.4 INTRAVENOUS SOLUTION: 125.0000 125.0000 mL/hr | INTRAVENOUS | Qty: 1000

## 2022-07-31 MED FILL — SODIUM CHLORIDE 0.9 % INTRAVENOUS SOLUTION: INTRAVENOUS | Qty: 1000

## 2022-07-31 MED FILL — IPRATROPIUM 0.5 MG-ALBUTEROL 3 MG (2.5 MG BASE)/3 ML NEBULIZATION SOLN: 0.5 0.5 mg-3 mg(2.5 mg base)/3 mL | RESPIRATORY_TRACT | Qty: 3

## 2022-07-31 MED FILL — OPTISON 0.22 MG/ML INTRAVENOUS SUSPENSION: 0.22 0.22 mg/mL | INTRAVENOUS | Qty: 3

## 2022-07-31 MED FILL — LIDOCAINE HCL 10 MG/ML (1 %) INJECTION SOLUTION: 10 10 mg/mL (1 %) | INTRAMUSCULAR | Qty: 20

## 2022-07-31 MED FILL — NORMOSOL-R PH 7.4 INTRAVENOUS SOLUTION: 1000.0000 1000.0000 mL | INTRAVENOUS | Qty: 1000

## 2022-07-31 MED FILL — HYDROMORPHONE 0.5 MG/0.5 ML INJECTION SYRINGE: 0.5 0.5 mg/0.5 mL | INTRAMUSCULAR | Qty: 0.5

## 2022-07-31 MED FILL — MORPHINE 10 MG/ML INTRAVENOUS SOLUTION: 10 10 mg/mL | INTRAVENOUS | Qty: 1

## 2022-07-31 MED FILL — NOREPINEPHRINE BITARTRATE 16 MG/250 ML (64 MCG/ML) IN 0.9 % NACL IV: 16 16 mg/250 mL (64 mcg/mL) | INTRAVENOUS | Qty: 250

## 2022-07-31 MED FILL — FENTANYL (PF) 50 MCG/ML INJECTION SOLUTION: 50 50 mcg/mL | INTRAMUSCULAR | Qty: 2

## 2022-07-31 MED FILL — SODIUM BICARBONATE 50 MEQ/50 ML (8.4 %) INTRAVENOUS SYRINGE: 50 50 mEq/50 mL (8.4 %) | INTRAVENOUS | Qty: 50

## 2022-07-31 MED FILL — SOLU-CORTEF ACT-O-VIAL (PF) 100 MG/2 ML SOLUTION FOR INJECTION: 100 100 mg/2 mL | INTRAMUSCULAR | Qty: 2

## 2022-07-31 MED FILL — VASOPRESSIN 20 UNIT/ML INTRAVENOUS SOLUTION: 20 20 unit/mL | INTRAVENOUS | Qty: 2

## 2022-07-31 MED FILL — METRONIDAZOLE 500 MG/100 ML IN SODIUM CHLOR(ISO) INTRAVENOUS PIGGYBACK: 500 500 mg/100 mL | INTRAVENOUS | Qty: 100

## 2022-07-31 MED FILL — SODIUM CHLORIDE 0.9 % INTRAVENOUS SOLUTION: 20.00 20.00 mL/hr | INTRAVENOUS | Qty: 1000

## 2022-07-31 MED FILL — DEXTROSE 20 % IN WATER (D20W) INTRAVENOUS SOLUTION: 20 20 % | INTRAVENOUS | Qty: 500

## 2022-07-31 MED FILL — PROMETHAZINE 12.5 MG RECTAL SUPPOSITORY: 12.5 12.5 MG | RECTAL | Qty: 1

## 2022-07-31 MED FILL — SODIUM BICARBONATE 8.4 % (1 MEQ/ML) INTRAVENOUS SYRINGE: 8.4 8.4 % (1 mEq/mL) | INTRAVENOUS | Qty: 150

## 2022-07-31 MED FILL — MIDAZOLAM (PF) 1 MG/ML INJECTION SOLUTION: 1 1 mg/mL | INTRAMUSCULAR | Qty: 5

## 2022-07-31 MED FILL — HEPARIN (PORCINE) 5,000 UNIT/ML INJECTION SOLUTION: 5000 5,000 unit/mL | INTRAMUSCULAR | Qty: 1

## 2022-07-31 MED FILL — DEXTROSE 10 % IN WATER (D10W) INTRAVENOUS SOLUTION: 10 10 % | INTRAVENOUS | Qty: 250

## 2022-07-31 MED FILL — DEXTROSE 50 % IN WATER (D50W) INTRAVENOUS SYRINGE: INTRAVENOUS | Qty: 50

## 2022-07-31 MED FILL — NORMOSOL-R PH 7.4 INTRAVENOUS SOLUTION: 500.0000 500.0000 mL | INTRAVENOUS | Qty: 1000

## 2022-07-31 MED FILL — CEFEPIME 2 GRAM SOLUTION FOR INJECTION: 2 2 gram | INTRAMUSCULAR | Qty: 1

## 2022-07-31 MED FILL — PROTONIX 40 MG INTRAVENOUS SOLUTION: 40 40 mg | INTRAVENOUS | Qty: 1

## 2022-07-31 MED FILL — VANCOMYCIN 1,000 MG INTRAVENOUS INJECTION: 1000 1000 mg | INTRAVENOUS | Qty: 1250

## 2022-07-31 NOTE — Significant Event (Addendum)
1505: Sodium bicarbonate in D5W infusion started per MD orders (see eMAR).  1516: Levophed increased up to 8 mcg/min (see eMAR).  1519: Levophed increased up to 10 mcg/min (see eMAR).  1522: Levophed increased up to 12 mcg/min (see eMAR).  1524: Levophed increased up to 15 mcg/min (see eMAR).  1525: Vasostrict restarted at 0.03 units/min (see eMAR).  1530: Patient now bradycardic (HR down to 50's) and unarousable with ongoing simultaneous profound hypotension. Unable to thoroughly assess respiratory status as spo2 continues to not accurately read, but patient remains on 3L nasal cannula and appears to be agonally breathing. Dr. Joyce Gross at bedside with this RN.  1531: Levophed increased up to 16 mcg/min (see eMAR). Critical ABG pH of < 7.00 - MD at bedside made aware.  1531: Levophed increased up to 17 mcg/min (see eMAR).  1540: Levophed increased up to 20 mcg/min (see eMAR).  1543: Levophed increased up to 30 mcg/min (see eMAR).  1544: Levophed increased up to 40 mcg/min (see eMAR).  1545: Sodium bicarbonate pushed per MD orders (see eMAR).  1549: Levophed increased to maximum rate of 60 mcg/min (see eMAR).  1600: Patient still with ongoing and unresolved bradycardia, hypotension, and mental status deterioration despite the above measures taken to try to resuscitate. Decision made by patient's daughter and son at bedside to transition to [facility]. MD to place comfort care orders. No further interventions at this time. Handoff given to oncoming nurse for transfer of care.

## 2022-07-31 NOTE — Plan of Care (Signed)
Problem: Acute Pain  Description: Patient's pain progressing toward patient's stated pain goal  Goal: Patient displays improved well-being such as baseline levels for pulse, BP, respirations and relaxed muscle tone or body posture  Outcome: Progressing     Problem: Knowledge Deficit  Goal: Patient/family/caregiver demonstrates understanding of disease process, treatment plan, medications, and discharge instructions  Description: Complete learning assessment and assess knowledge base.  Outcome: Progressing     Problem: Potential for Compromised Skin Integrity  Goal: Skin integrity is maintained or improved  Description: Assess and monitor skin integrity. Identify patients at risk for skin breakdown on admission and per policy. Collaborate with interdisciplinary team and initiate plans and interventions as needed.  Outcome: Progressing     Problem: Patient will remain free of injury d/t fall  Goal: High Fall Risk Precautions  Outcome: Progressing     Problem: Patient will remain free of falls  Goal: Universal Fall Precautions  Outcome: Progressing

## 2022-07-31 NOTE — Nursing Note (Signed)
Patient became hypotensive, map <60, patient is lethargic, MD notified. Bolus of 500 normal saline given, Levophed (@60 ) and vasopressin continued.   MD talking to family, family in agreement to change patient status to comfort care.

## 2022-07-31 NOTE — Consults (Signed)
Nephrology Consult Note.    Name:  Charles Drake Date/Time of Admission: 07/20/2022  6:52 PM    CSN: [id number] Attending Provider: Demetrius Charity, DO   Room/Bed: 216/M216 DOB: [birth year] Age: 82 y.o.     Reason for Nephrology consult.   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     jed, kutch 82 y.o. 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  Patient has regular hemodialysis treatment on   Tuesday Thursday Saturday.  At hemodialysis unit.  Patient has indwelling tunneled dialysis catheter.    [facility] hemodialysis unit..  Last hemodialysis treatment was yesterday    Patient admitted with abdominal pain, and hypotension..    In the ER patient is found to have ischemic bowel.    Patient admitted to ICU with severe hypotension and requiring pressor support.  CT scan also showed possible metastatic hepatic/peritoneal involvement.    Patient is considering options and has decided not to pursue active treatment and is made DNR CCA.  He is weak and lethargic.        Medications reviewed.  Medical records reviewed    Past Medical History.    Past Medical History:   Diagnosis Date    Aortic stenosis     Chronic hypotension     Chronic respiratory failure with hypoxia (CMS-HCC)     on 2L    ESRD on hemodialysis (CMS-HCC)     GI bleed     Lung cancer (CMS-HCC)     Paroxysmal A-fib (CMS-HCC)        Medications  Which i have  Reviewed, also have reviewed home medications  Prior to Admission medications    Not on File     @MEDSCURRENTMD @   norepinephrine Stopped (2022-08-17 1649)    sodium bicarbonate 150 mEq in dextrose 5% in water (D5W) 1,000 mL IV infusion Stopped (08/17/22 1625)    vasopressin Stopped (2022-08-17 1617)         Allergies.  No Known Allergies    Social History.  Social History     Socioeconomic History    Marital status: Widowed     Spouse name: Not on file    Number of children: Not on file     Years of education: Not on file    Highest education level: Not on file   Occupational History    Not on file   Tobacco Use    Smoking status: Former     Types: Cigarettes    Smokeless tobacco: Not on file   Substance and Sexual Activity    Alcohol use: Not on file    Drug use: Not on file    Sexual activity: Not on file   Other Topics Concern    Not on file   Social History Narrative    Not on file     Social Determinants of Health     Food Insecurity: No Food Insecurity (04/06/2022)    Received from [facility]    Hunger Vital Sign     Worried About Running Out of Food in the Last Year: Never true     Ran Out of Food in the Last Year: Never true   Transportation Needs: Unknown (04/06/2022)    Received from [facility]    PRAPARE - Therapist, art (Medical):  Not on file     Lack of Transportation (Non-Medical): No   Intimate Partner Violence: Not on file   Housing Stability: Not on file       Family History    Family History   Problem Relation Age of Onset    Diabetes Mother     Cerebral aneurysm Mother     Lung Cancer Father     Diabetes Sister        Review of Systems.     Complains of generalized weakness         PHYSICAL EXAMINATION.    BP 93/80   Pulse (!) 0   Temp (!) 93 F (33.9 C) (Oral)   Resp (!) 0   Ht 5' 10 (1.778 m)   Wt 129 lb 10.1 oz (58.8 kg)   SpO2 (!) 82%   BMI 18.60 kg/m     Intake/Output Summary (Last 24 hours) at 08-Aug-2022 1756  Last data filed at 08-08-2022 1700  Gross per 24 hour   Intake 6447.16 ml   Output --   Net 6447.16 ml       INTAKE/OUTPUT:  I/O last 3 completed shifts:  In: 4150.9 [I.V.:886.1; IV Piggyback:3264.8]  Out: -   I/O this shift:  In: 2296.3 [I.V.:1831.3; Blood:350; IV Piggyback:115]  Out: -       Vitals:    08-Aug-2022 1700   BP:    Pulse: (!) 0   Resp: (!) 0   Temp:    SpO2:        Intake/Output Summary (Last 24 hours) at 2022-08-08 1756  Last data filed at 08-08-2022 1700  Gross per 24 hour   Intake  6447.16 ml   Output --   Net 6447.16 ml      Ill Appearing.    Moderate respiratory distress.   lethargic.   hypotension on pressor support.  Neck. JVD not visible. No lymph nodes palpable.  CVS.  Heart sounds are normal. No murmurs. No pericardial rub.  RS. Bilateral Basal rales.       Labs reviewed by me     Lab Results   Component Value Date    WBC 10.8 August 08, 2022    HCT 22.9 (L) Aug 08, 2022    MCV 90.7 08-08-22    PLT 118 (L) 08/08/22     Lab Results   Component Value Date    CREATININE 4.34 (H) 08-Aug-2022    BUN 50 (H) 2022/08/08    NA 139 2022-08-08    K 4.3 2022-08-08    CL 96 (L) 2022/08/08    CO2 17 (L) 2022/08/08         Lab 2022/08/08  0351 2022/08/08  0112 07/29/2022  1944   SODIUM 139 139  140 139   POTASSIUM 4.3 3.5  3.8 4.0   CHLORIDE 96* 101  98 99   CO2 17* 16*  16* 15*   BUN 50* 38*  41* 41*   CREATININE 4.34* 3.50*  3.82* 4.43*   CALCIUM 8.1* 6.2*  6.8* 9.1             LFTs  Recent Labs     08/17/2022  1944 08/01/2022  1954   AST 285* 285*   ALT 322* 321*   ALKPHOS 85 87     Recent Labs     08/06/2022  1954   LIPASE 211*       Arterial Blood Gasses  Recent Labs     08-Aug-2022  1514   PCO2  49*             Imaging Results.  Chest X Ray reviwed by me show            ASSESSMENT AND PLAN    ESRD dependent on hemodialysis.  Severe hypotension on pressor support.  Ischemic bowel.  Possible metastatic malignancy on CT scan.  History of lung cancer status post right lower lobe resection.  History of pulmonary hypertension.  Aortic stenosis.  COPD.      Plan:  Patient has presented with severe hypotension now on pressor support.  Patient is noted to have ischemic bowel on presentation.  The CT scan on admission also shows suspicious metastatic involvement of the hepatic/peritoneum.    Patient is opted for DNR CCA-given poor prognosis.    There are no plans to provide hemodialysis treatment today.    Agree with comfort care measures.    His pain appears well-controlled.    Prognosis poor.    Discussed with ICU  team.  Discussed with nursing.                   Electronically Signed:  Sylvan Cheese, MD 08/11/2022

## 2022-07-31 NOTE — Discharge Summary (Signed)
[facility]  Discharge Summary      PATIENT INFORMATION   Patient's PCP:  Augusto Garbe, MD  Name: Charles Drake  DOB: Jun 30, 1940  MRN: [id number]     Admit Date:  08/24/2022    Discharge Date:  Deceased on 08-25-2022    Patient Class:  Inpatient    ADMITTING / DISCHARGING PHYSICIAN   Admitting Physician:  Johann Capers, MD    Discharge Physician:  Mardene Speak DO    HOSPITAL COURSE   Pending Tests at Discharge  none    Incidental Findings Requiring Follow Up  none      Consulting Physicians:  Nephrology  Pulmonology critical care      Brief Hospital Course:   Patient was admitted to the hospital and the following problems were managed:  Septic shock  Ischemic bowel  ESRD on HD  Widespread metastatic disease  Pneumonia  Right common iliac artery dissection  Constipation    23 male presented [facility] with hypotension and low blood pressure.  He was found to have diffuse colonic ischemia.  He was admitted intensive care unit surgery was consulted imaging also showed concern for diffuse metastatic disease.  Surgery recommended against surgical intervention as it likely would not improve his outcome.  Family elected for comfort care measures.  He deceased shortly after presentation.    PHYSICAL EXAM   Physical exam on day of hospital discharge:     BP 93/80   Pulse (!) 0   Temp (!) 93 F (33.9 C) (Oral)   Resp (!) 0   Ht 5' 10 (1.778 m)   Wt 129 lb 10.1 oz (58.8 kg)   SpO2 90%   BMI 18.60 kg/m       LABS   Labs prior to hospital discharge:  Recent Labs     08/24/2022  1944 08-25-2022  0351   WBC 19.5* 10.8   HGB 7.3* 7.5*   HCT 22.8* 22.9*   PLT 137* 118*                                                                  Recent Labs     08/24/22  1944 2022/08/25  0112 25-Aug-2022  0351   NA 139 139  140 139   K 4.0 3.5  3.8 4.3   CL 99 101  98 96*   CO2 15* 16*  16* 17*   BUN 41* 38*  41* 50*   CREATININE 4.43* 3.50*  3.82* 4.34*   GLUCOSE 85 54*  57* 124*       SURGERIES       LINES / DRAINS /  AIRWAYS     Patient Lines/Drains/Airways Status       Active Line / PIV Line       Name Placement date Placement time Site Days    Hemodialysis Access Arteriovenous Fistula Right Forearm --  --  Forearm  --    Arterial Line 25-Aug-2022 Right Femoral 08-25-22  0621  Femoral  2    CVC Triple Lumen 08/24/22 Right Internal jugular August 24, 2022  2327  Internal jugular  2    Peripheral IV 08-24-2022 Anterior;Left Forearm 2022/08/24  1900  Forearm  2    Peripheral IV 2022-08-24 Left Antecubital 08/24/2022  2033  Antecubital  2                    IMAGING STUDIES   Echo 2D Complete (TTE)    Result Date: 08/02/2022                                 Deborah Chalk                          Department of Cardiology*                            [address]                            [place], [place] [address]                                 [phone] Transthoracic Echocardiogram Patient:          Monico, Sudduth     Room:   216        Height: 70in MR Number:        [id number]         DOB:    12-15-1940 Weight: 129lb Account:          [id number]       Gender: M          BP:     120 / 61 Study Date:       Aug 14, 2022       Age:    82         BSA:    1.45m^2 Referring physician:    Debbra Riding Interpreting physician: Lionel December   PERFORMING   [facility], [facility]  SONOGRAPHER  Tiana Loft RDCS  ORDERING     Singla, Abhishek  REFERRING    Singla, Abhishek  CONSULTING   Singla, Abhishek  ATTENDING    Sela Hilding    Kuper, Olivia ---------------------------------------------------------------------------- Procedure:ECHO 2D COMPLETE (TTE)   Order: Accession Number:[id number] ---------------------------------------------------------------------------- Indications:      (NSTEMI I21.4). ---------------------------------------------------------------------------- PMH:   Atrial fibrillation.  Chronic obstructive pulmonary disease.  Primary pulmonary hypertension. Hypotension, ESRD on HD, Lung cancer.  ---------------------------------------------------------------------------- Study data:  Height: 70in. 177.8cm. Weight: 129lb. 58.5kg.  Study status: Routine.  Procedure:  A transthoracic echocardiogram was performed. Image quality was good. Scanning was performed from the parasternal, apical, and subcostal acoustic windows.          Transthoracic echocardiogram.  M-mode, complete 2D, complete spectral Doppler, and color Doppler.  Birthdate: Patient birthdate: [birth year].  Age:  Patient is 82year(s) old.  Sex:  Birth gender: male.  Body mass index:  BMI: 18.5kg/m^2.  Body surface area: BSA: 1.22m^2.  Blood pressure:     120/61  Patient status:  Inpatient. Study date:  Study date: 08/14/2022. Study time: 02:24 PM.  Location: Bedside. ---------------------------------------------------------------------------- Study Conclusions - Left ventricle: The cavity size is normal. Wall thickness was increased in   a pattern of moderate to severe LVH. Systolic function is normal. The   estimated ejection fraction is 50-55%. Wall motion is normal; there are no   regional wall motion abnormalities. Abnormal relaxation with increased  filling pressures. - Aortic valve: Velocity is increased. 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   There is moderate regurgitation. The peak systolic velocity is 3.39m/sec.   The mean systolic gradient is 33mm Hg. The peak systolic gradient is 50mm   Hg. The ratio of LVOT to aortic valve peak velocity is 0.18. - Mitral valve: The annulus is mildly calcified. There is mild thickening.   There is mild to moderate regurgitation. - Right ventricle: Systolic function is mildly reduced by objective   interpretation. ---------------------------------------------------------------------------- Cardiac Anatomy Left ventricle: - The cavity size is normal. Wall thickness was increased in a pattern of   moderate to severe LVH. Systolic function is normal. The estimated   ejection fraction is 50-55%. Wall motion  is normal; there are no regional   wall motion abnormalities. - Abnormal relaxation with increased filling pressures. Aorta: Aortic root: The root is normal in size. Aortic valve: - TrileafletThe leaflets are severely thickened and severely calcified.   Mobility is not restricted. Velocity is increased. There is moderate to   severe stenosis. There is moderate regurgitation. The mean systolic   gradient is 33mm Hg. The peak systolic gradient is 50mm Hg. The valve area   is 0.7cm^2. The valve area index is 0.38cm^2/m^2. The ratio of LVOT to   aortic valve peak velocity is 0.18. The valve area is 0.6cm^2. The valve   area index is 0.33cm^2/m^2. The ratio of LVOT to aortic valve mean   velocity is 0.18. The valve area is 0.6cm^2. The valve area index is   0.32cm^2/m^2. Mitral valve: - The annulus is mildly calcified. There is mild thickening. Mobility is not   restricted. Inflow velocity is within the normal range. There is no   evidence for stenosis. There is mild to moderate regurgitation. The mean   diastolic gradient is 3mm Hg. The peak diastolic gradient is 3mm Hg. The   valve area is 2.1cm^2. The valve area index is 1.2cm^2/m^2. The valve area   (LVOT continuity) is 2.1cm^2. The valve area index (LVOT continuity) is   1.2cm^2/m^2. Left atrium:  The atrium is normal in size. Pulmonary artery: - Systolic pressure was within the normal range. Main pulmonary artery: - Right ventricle: - The cavity size is normal. Wall thickness is normal. Systolic function is   mildly reduced by objective interpretation. Pulmonic valve: - Velocity is within the normal range. There is no evidence for stenosis.   There is no regurgitation. Tricuspid valve: - The valve is structurally normal. Inflow velocity is within the normal   range. There is mild regurgitation. Right atrium:  The atrium is normal in size. Pericardium: - There is no pericardial effusion. Systemic veins: Inferior vena cava: The IVC is normal-sized.  ---------------------------------------------------------------------------- Measurements  Left ventricle            Value          Ref  EDD, LAX              (L) 3.0   cm       4.2 - 5.8  ESD, LAX              (L) 2.2   cm       2.5 - 4.0  EDD/bsa, LAX          (L) 1.7   cm/m^2   2.2 - 3.0  ESD/bsa, LAX          (N) 1.3   cm/m^2   1.3 - 2.1  FS, LAX               (N) 27    %        25 - 43  FS, LAX chord         (N) 27    %        25 - 43  IVS, ED               (H) 2.1   cm       0.6 - 1.0  ESD                   (L) 2.2   cm       2.5 - 4.0  ESD/bsa               (N) 1.3   cm/m^2   1.3 - 2.1  PW, ED                (H) 1.4   cm       0.6 - 1.0  IVS/PW, ED                1.5            ---------  EDV                   (L) 35    ml       62 - 150  ESV                   (L) 16    ml       21 - 61  EF                    (N) 54    %        52 - 72  SV                        56    ml       ---------  EDV/bsa               (L) 20    ml/m^2   34 - 74  ESV/bsa               (L) 9     ml/m^2   11 - 31  SV/bsa                    32    ml/m^2   ---------  SV, 1-p A2C               19    ml       ---------  SV/bsa, 1-p A2C           10.9  ml/m^2   ---------  SV, 1-p A4C               16    ml       ---------  SV/bsa, 1-p A4C           9     ml/m^2   ---------  E', lat ann, TDI      (L) 4.4   cm/sec   >=10.0  E/e', lat ann, TDI    (H) 21             <=13  E', med ann, TDI      (L) 4.4  cm/sec   >=7.0  E/e', med ann, TDI        21             ---------  E', avg, TDI              4.4   cm/sec   ---------  E/e', avg, TDI        (H) 21             <=14   LVOT                      Value          Ref  Diam, S                   2.0   cm       ---------  Area                      3.1   cm^2     ---------  Peak vel, S               0.64  m/sec    ---------  Mean vel, S               0.45  m/sec    ---------  SV                        56    ml       ---------  SV/bsa                    32    ml/m^2   ---------   Right ventricle            Value          Ref  EDD, LAX                  2.4   cm       ---------  TAPSE, MM             (N) 1.7   cm       >=1.7  S' lateral            (L) 8.4   cm/sec   >=9.5   Left atrium               Value          Ref  AP dim, ES            (N) 3.1   cm       3.0 - 4.0  AP dim index, ES      (N) 1.8   cm/m^2   1.5 - 2.3  Area ES, A4C          (N) 14    cm^2     <=20  Area/bsa ES, A4C          7.8   cm^2/m^2 ---------  SI dim, A2C               4.5   cm       ---------  Vol, S                (N) 33    ml       18 - 58  Vol/bsa, S            (N) 19  ml/m^2   16 - 34  Vol, ES, 1-p A4C      (N) 26    ml       18 - 58  Vol/bsa, ES, 1-p A4C  (N) 15    ml/m^2   12 - 37  Vol, ES, 1-p A2C      (N) 39    ml       18 - 58  Vol/bsa, ES, 1-p A2C  (N) 22    ml/m^2   11 - 43  Vol, ES, 2-p              33    ml       ---------  Vol/bsa, ES, 2-p      (N) 19    ml/m^2   16 - 34   Aortic valve              Value          Ref  Peak v, S                 3.5   m/sec    ---------  Mean v, S                 2.53  m/sec    ---------  Mean grad, S              33    mm Hg    ---------  Peak grad, S              50    mm Hg    ---------  AVA, VTI                  0.7   cm^2     ---------  AVA/bsa, VTI              0.38  cm^2/m^2 ---------  LVOT/AV, Vpeak ratio      0.18           ---------  AVA, Vmax                 0.6   cm^2     ---------  AVA/bsa, Vmax             0.33  cm^2/m^2 ---------  LVOT/AV, Vmean ratio      0.18           ---------  AVA, Vmean                0.6   cm^2     ---------  AVA/bsa, Vmean            0.32  cm^2/m^2 ---------   Mitral valve              Value          Ref  Mean v, D                 0.86  m/sec    ---------  Peak E                    0.9   m/sec    ---------  Peak A                    1.11  m/sec    ---------  VTI leaflet coapt         27.0  cm       ---------  Decel time  197   ms       ---------  Mean grad, D              3     mm Hg    ---------  Peak grad, D              3     mm Hg     ---------  Peak E/A ratio            0.8            ---------  E-VTI                     27.0  cm       ---------  A-VTI                     27.0  cm       ---------  VTI E/A                   1.0            ---------  MVA                       2.1   cm^2     ---------  MVA/bsa                   1.2   cm^2/m^2 ---------  MVA, LVOT cont            2.1   cm^2     ---------  MVA/bsa, LVOT cont        1.2   cm^2/m^2 ---------  Dewayne Hatch VTI                   27.0  cm       ---------  Peak LV-LA grad S         136   mm Hg    ---------  Max MR v                  5.83  m/sec    ---------  Peak LV-LA grad S         136   mm Hg    ---------  Regurg VTI                213.0 cm       ---------   Pulmonic valve            Value          Ref  Peak v, S                 0.9   m/sec    ---------  Mean vel, S               0.63  m/sec    ---------  Accel time                70    ms       ---------   Aortic root               Value          Ref  Root diam             (N) 3.2   cm       2.5 - 3.9  Root diam/bsa  1.8   cm/m^2   ---------   Main pulmonary artery     Value          Ref  Mean grad                 2     mm Hg    ---------   Inferior vena cava        Value          Ref  Diam                  (N) 0.9   cm       <=2.1 Legend: (L)  and  (H)  mark values outside specified reference range. (N)  marks values inside specified reference range. Reviewed and confirmed by Lionel December 2024-07-15T12:44:29    Bedside focused transthoracic echocardiogram    Result Date: Aug 17, 2022  Focused Echocardiogram Examination     Exam category:  Clinically indicated     Indication(s) for Exam:          Select one or more indications:  hypotension     Findings:          Cardiac activity:  present         Pericardial effusion:  none         Left ventricular systolic function:  normal         IVC:  collapsible >50%     Interpretation:                  Signs of intravascular volume depletion         Comments:  Repeat exam after total of 3L fluid.  LV no longer hyperdynamic. IVC remains diminuitive     Attending Attestation:          I (the Attending) was present when the images were obtained by the operator AND/OR I reviewed the images after they were obtained.  I have reviewed the interpretation by the operator, made any necessary edits and agree with the interpretation as currently documented.  Electronically signed by Shela Leff on Saturday, 08-17-2022 at 3:48 AM    Bedside focused transthoracic echocardiogram    Result Date: 08-17-22  Focused Echocardiogram Examination     Exam category:  Clinically indicated     Indication(s) for Exam:          Select one or more indications:  hypotension     Findings:          Cardiac activity:  present         Pericardial effusion:  none         Left ventricular systolic function:  hyperdynamic         IVC:  collapsible >50%     Interpretation:                  No sonographic evidence of significant pericardial effusion         Signs of intravascular volume depletion     Attending Attestation:          I (the Attending) was present when the images were obtained by the operator AND/OR I reviewed the images after they were obtained.  I have reviewed the interpretation by the operator, made any necessary edits and agree with the interpretation as currently documented.  Electronically signed by Shela Leff on Saturday, 2022/08/17 at 3:44 AM    X-ray Portable Chest    Result Date: 08/17/2022  EXAM: XR PORTABLE CHEST INDICATION: right IJ line placement TECHNIQUE: 1 view of the chest. COMPARISON: CT chest 3 hours prior FINDINGS: Medical Devices: Right internal jugular central venous catheter tip projects over the right atrium. Heart and Mediastinum: Cardiomediastinal silhouette is within normal limits. Lungs and Pleura: Postsurgical changes in the right lung related to right upper lobectomy and right sided wedge resections better evaluated on recent CT. Hypoexpanded lungs with bibasilar parenchymal opacities, likely  atelectasis. Additional lung findings are better characterized on recent CT. No pleural effusions or evidence for pneumothorax. Bones and soft tissues: No acute abnormalities.     IMPRESSION: Right internal jugular central venous catheter tip projects over the right atrium. Approved by Vira Blanco, MD on 08/25/22 12:16 AM EDT I have personally reviewed the images and I agree with this report. Report Verified by: Charlean Sanfilippo, MD at 08-25-22 12:27 AM EDT    CT Abdomen and Pelvis With IV contrast    Result Date: 07/28/2022  EXAM: CT CHEST WITH CONTRAST EXAM: CT ABDOMEN AND PELVIS WITH CONTRAST INDICATION: Respiratory illness, nondiagnostic xray COMPARISON: None. TECHNIQUE: Helically acquired CT images from the lung apices through the pelvis was obtained in the supine position after the administration of IV contrast. Additional MIP images of the chest were performed. CONTRAST: 100 mL of IOHEXOL 350 MG IODINE/ML INTRAVENOUS SOLUTION administered intravenously FIELD OF VIEW: 40 cm.  FINDINGS: CHEST: LOWER NECK: Subcentimeter bilateral thyroid nodules, likely benign. HEART: The heart is normal in size. Severe aortic valvular calcifications. Moderate coronary artery calcifications. Mitral annular calcifications. THORACIC AORTA: Normal caliber and contour. Extensive calcified atherosclerotic plaque noted throughout the aorta and its branches with eccentric plaque in the proximal arch. PULMONARY ARTERIES: Normal in caliber. MEDIASTINUM AND HILA: No pathologically enlarged lymph nodes are identified. Patulous esophagus with retained contents. CHEST WALL AND AXILLA: Unremarkable. PLEURA: No pleural effusions or pneumothorax. AIRWAYS & LUNGS: The central airways are patent. Partial right lung resections, suspect right upper lobectomy and right middle lobe wedge resection, with surrounding parenchymal bands and architectural distortion. Irregular 1.6 cm opacity along the lateral margin of the right middle lobe wedge resection  (series 304 image 127). Anterior and posterior right lower lobe tree-in-bud nodularity. Mild bibasilar bandlike opacities, likely atelectasis or scarring. OSSEOUS STRUCTURES: Numerous lucent lesions for example within the lateral right eighth rib. Mild multilevel degenerative disc disease. ABDOMEN/PELVIS: LIVER: Multifocal large areas of heterogeneous and ill-defined right hepatic lobe hypoenhancement. Additional scattered subcentimeter hypodense lesions. BILIARY TREE: No intra or extra-hepatic biliary ductal dilatation. The gallbladder is unremarkable. SPLEEN: Not enlarged. Patchy splenic hypoattenuation including a linear superior splenic focus measuring 2 cm (series 305 image 28). PANCREAS: Unremarkable. ADRENAL GLANDS: Unremarkable. KIDNEYS/URETERS/BLADDER: Multiple heterogeneously enhancing bilateral renal lesions, a partially cystic right interpolar lesion measuring 2.2 x 1.9 cm (series 305 image 36) and left interpolar lesion measuring 1.6 x 1.3 cm (series 305 image 49) are representative examples. Patchy renal cortical hypoenhancement bilaterally. No hydronephrosis. Urinary bladder is decompressed limiting evaluation. GASTROINTESTINAL TRACT: Wall thickening of the transverse colon to the rectum with wall hypoenhancement. The appendix is not visualized. Large colonic stool burden with 7 cm rectal stool ball. LYMPHATICS: No abdominal or pelvic lymph nodes are enlarged by size criteria. VASCULATURE: Tortuosity of the abdominal aorta with extensive calcified and noncalcified atherosclerotic plaque. 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 Eccentric noncalcified filling defect in the infrarenal abdominal aorta (series 602 image 58). IMA is not well-visualized.  PERITONEUM: Patchy ill-defined mesenteric and omental attenuation, for example in the left upper quadrant on series 305 image 58. Trace ascites. No  free air or loculated fluid collections. ABDOMINAL WALL/SOFT TISSUES: Unremarkable. GENITAL STRUCTURES: Prostatomegaly. 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 Adjacent round hyperdensity measuring 9 mm (series 305 image 107). 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 Numerous osteolytic lesions in the thoracolumbar spine and bilateral pelvis, for example 2 cm right posterior iliac lesion (series 305 image 85).     IMPRESSION: Chest 1.  Right lower lobe tree-in-bud nodularity, likely infectious or inflammatory including aspiration. 2.  Right upper lobectomy and right middle lobe wedge resection with irregular opacity along the lateral margin of the right middle lobe/resection. Favor postoperative however recommend correlation with outside prior imaging. 3.  Findings suggestive of gastroesophageal reflux. 4.  Scattered lucent osseous lesions concerning for metastases given abdominal findings. Abdomen and Pelvis 1.  Nonenhancement of the thickened colonic wall extending from the mid transverse colon to the rectum, highly concerning for bowel ischemia. Extensive atherosclerosis with eccentric noncalcified infrarenal abdominal aorta filling defect which could represent mural thrombus and a potential source. 2.  Multiple heterogeneously enhancing bilateral renal lesions concerning for malignancy with differential to include primary renal neoplasms versus multiple metastases. 3.  Multifocal large areas of ill-defined right hepatic lobe hypoenhancement with additional subcentimeter hypodense lesions. Given additional findings in the abdomen and pelvis, these are suspicious for hepatic metastases. 4.  Patchy mesenteric and omental attenuation including a nondependent portions of the abdomen raise concern for peritoneal carcinomatosis versus patchy mesenteric edema related to the bowel ischemia. 5.  Numerous thoracolumbar spine and bilateral pelvic  osteolytic lesions suspicious for osseous metastases. 6.  Patchy splenic hypoattenuation is most likely related to contrast phase timing. Metastases considered less likely. 7.  Findings discussed above concerning for multifocal malignancy should be compared to the outside prior imaging. 8.  Patchy renal cortical hypoenhancement bilaterally may relate to ATN or infarcts. 9.  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 10.  Right inguinal canal soft tissue density, suspect retractile testicle. Associated 9 mm hyperdensity could represent a testicular lesion. Recommend correlation with sonographic evaluation. 11.  Large colonic stool burden with 7 cm rectal stool ball. CRITICAL RESULT: Bowel ischemia.  This finding was discussed with Shela Leff, MD on  08-14-22 10:27 PM EDT by telephone. They confirmed that they understood the findings communicated to them.  #[id number]# Approved by Inda Merlin, MD on 08-14-22 10:28 PM EDT I have personally reviewed the images and I agree with this report. Report Verified by: Melodye Ped, MD at 08/14/2022 10:39 PM EDT    CT Chest With IV contrast    Result Date: 2022/08/14  EXAM: CT CHEST WITH CONTRAST EXAM: CT ABDOMEN AND PELVIS WITH CONTRAST INDICATION: Respiratory illness, nondiagnostic xray COMPARISON: None. TECHNIQUE: Helically acquired CT images from the lung apices through the pelvis was obtained in the supine position after the administration of IV contrast. Additional MIP images of the chest were performed. CONTRAST: 100 mL of IOHEXOL 350 MG IODINE/ML INTRAVENOUS SOLUTION administered intravenously FIELD OF VIEW: 40 cm.  FINDINGS: CHEST: LOWER NECK: Subcentimeter bilateral thyroid nodules, likely benign. HEART: The heart is normal in size. Severe aortic valvular calcifications. Moderate coronary artery calcifications. Mitral annular calcifications. THORACIC AORTA: Normal caliber and contour. Extensive  calcified atherosclerotic plaque noted throughout the aorta and its branches with eccentric plaque in the proximal  arch. PULMONARY ARTERIES: Normal in caliber. MEDIASTINUM AND HILA: No pathologically enlarged lymph nodes are identified. Patulous esophagus with retained contents. CHEST WALL AND AXILLA: Unremarkable. PLEURA: No pleural effusions or pneumothorax. AIRWAYS & LUNGS: The central airways are patent. Partial right lung resections, suspect right upper lobectomy and right middle lobe wedge resection, with surrounding parenchymal bands and architectural distortion. Irregular 1.6 cm opacity along the lateral margin of the right middle lobe wedge resection (series 304 image 127). Anterior and posterior right lower lobe tree-in-bud nodularity. Mild bibasilar bandlike opacities, likely atelectasis or scarring. OSSEOUS STRUCTURES: Numerous lucent lesions for example within the lateral right eighth rib. Mild multilevel degenerative disc disease. ABDOMEN/PELVIS: LIVER: Multifocal large areas of heterogeneous and ill-defined right hepatic lobe hypoenhancement. Additional scattered subcentimeter hypodense lesions. BILIARY TREE: No intra or extra-hepatic biliary ductal dilatation. The gallbladder is unremarkable. SPLEEN: Not enlarged. Patchy splenic hypoattenuation including a linear superior splenic focus measuring 2 cm (series 305 image 28). PANCREAS: Unremarkable. ADRENAL GLANDS: Unremarkable. KIDNEYS/URETERS/BLADDER: Multiple heterogeneously enhancing bilateral renal lesions, a partially cystic right interpolar lesion measuring 2.2 x 1.9 cm (series 305 image 36) and left interpolar lesion measuring 1.6 x 1.3 cm (series 305 image 49) are representative examples. Patchy renal cortical hypoenhancement bilaterally. No hydronephrosis. Urinary bladder is decompressed limiting evaluation. GASTROINTESTINAL TRACT: Wall thickening of the transverse colon to the rectum with wall hypoenhancement. The appendix is not visualized.  Large colonic stool burden with 7 cm rectal stool ball. LYMPHATICS: No abdominal or pelvic lymph nodes are enlarged by size criteria. VASCULATURE: Tortuosity of the abdominal aorta with extensive calcified and noncalcified atherosclerotic plaque. 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 Eccentric noncalcified filling defect in the infrarenal abdominal aorta (series 602 image 58). IMA is not well-visualized. PERITONEUM: Patchy ill-defined mesenteric and omental attenuation, for example in the left upper quadrant on series 305 image 58. Trace ascites. No free air or loculated fluid collections. ABDOMINAL WALL/SOFT TISSUES: Unremarkable. GENITAL STRUCTURES: Prostatomegaly. 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 Adjacent round hyperdensity measuring 9 mm (series 305 image 107). 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 Numerous osteolytic lesions in the thoracolumbar spine and bilateral pelvis, for example 2 cm right posterior iliac lesion (series 305 image 85).     IMPRESSION: Chest 1.  Right lower lobe tree-in-bud nodularity, likely infectious or inflammatory including aspiration. 2.  Right upper lobectomy and right middle lobe wedge resection with irregular opacity along the lateral margin of the right middle lobe/resection. Favor postoperative however recommend correlation with outside prior imaging. 3.  Findings suggestive of gastroesophageal reflux. 4.  Scattered lucent osseous lesions concerning for metastases given abdominal findings. Abdomen and Pelvis 1.  Nonenhancement of the thickened colonic wall extending from the mid transverse colon to the rectum, highly concerning for bowel ischemia. Extensive atherosclerosis with eccentric noncalcified infrarenal abdominal aorta filling defect which could represent mural thrombus and a  potential source. 2.  Multiple heterogeneously enhancing bilateral renal lesions concerning for malignancy with differential to include primary renal neoplasms versus multiple metastases. 3.  Multifocal large areas of ill-defined right hepatic lobe hypoenhancement with additional subcentimeter hypodense lesions. Given additional findings in the abdomen and pelvis, these are suspicious for hepatic metastases. 4.  Patchy mesenteric and omental attenuation including a nondependent portions of the abdomen raise concern for peritoneal carcinomatosis versus patchy mesenteric edema related to the bowel ischemia. 5.  Numerous thoracolumbar spine and bilateral  pelvic osteolytic lesions suspicious for osseous metastases. 6.  Patchy splenic hypoattenuation is most likely related to contrast phase timing. Metastases considered less likely. 7.  Findings discussed above concerning for multifocal malignancy should be compared to the outside prior imaging. 8.  Patchy renal cortical hypoenhancement bilaterally may relate to ATN or infarcts. 9.  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 10.  Right inguinal canal soft tissue density, suspect retractile testicle. Associated 9 mm hyperdensity could represent a testicular lesion. Recommend correlation with sonographic evaluation. 11.  Large colonic stool burden with 7 cm rectal stool ball. CRITICAL RESULT: Bowel ischemia.  This finding was discussed with Shela Leff, MD on  2022-08-23 10:27 PM EDT by telephone. They confirmed that they understood the findings communicated to them.  #[id number]# Approved by Inda Merlin, MD on August 23, 2022 10:28 PM EDT I have personally reviewed the images and I agree with this report. Report Verified by: Melodye Ped, MD at 2022/08/23 10:39 PM EDT    X-ray Portable Chest    Result Date: 08/23/2022  EXAM: XR PORTABLE CHEST INDICATION: hypotension TECHNIQUE: 1 view of the chest. COMPARISON:  None. FINDINGS: Medical Devices: None. Heart and Mediastinum: Cardiac size is within normal limits. Calcified atherosclerosis at the aortic knob. Lungs and Pleura: Hypoexpanded lungs with mild bibasilar parenchymal opacities, likely atelectasis. Bones and soft tissues: No acute abnormalities.     IMPRESSION: No acute cardiopulmonary abnormality. Report Verified by: Melodye Ped, MD at Aug 23, 2022 8:21 PM EDT       OTHER PROCEDURES       ALLERGIES   No Known Allergies    DISCHARGE MEDICATIONS   There are no discharge medications for this patient.      DISPOSITION   deceased        FOLLOW-UP   Call and schedule an appointment with your Primary Care Physician Augusto Garbe, MD within 1 week of discharge.  No follow-up provider specified.    No future appointments.    Total time spent on this discharge was 25 minutes including time spent with the patient, reviewing data, completing medical records, and communication with the patient.    Thank you for the opportunity to be involved in your patient's care.   Electronically signed,  Mardene Speak DO  08/02/2022  4:08 PM  [facility]

## 2022-07-31 NOTE — Consults (Signed)
[facility] SURGICAL CONSULT    Patient: Charles Drake  MRN: [id number]  CSN: [id number]    History     CC: Hypotension    HPI: Dharmender Mcphee is a 82 y.o. male with h/o ESRD on HD, COPD on chronic oxygen, lung cancer s/p RLL resection who presents with hypotension, malaise, and abdominal discomfort since after his dialysis session earlier today. He reports that 2L were removed. He felt dizzy and unwell, and called EMS. He then sent the squad away, and called again later after he felt worse.     In ED he was noted to be profoundly hypotensive. 2L fluid given, antibiotics and pressors started. Noted to have lactate of 11 and WBC 19.     CT concerning for ischemia of the distal transverse and descending colon to rectum; metastatic lesions, and atherosclerotic disease.     Patient recently move to the area from [place] - no EMR records available. He reports ongoing weight loss and recent ongoing lose stools.     PMH:  ESRD on HD  COPD  Lung CA    PSH:  R LL lung resection    SH:  Social History     Socioeconomic History    Marital status: Widowed     Spouse name: Not on file    Number of children: Not on file    Years of education: Not on file    Highest education level: Not on file   Occupational History    Not on file   Tobacco Use    Smoking status: Not on file    Smokeless tobacco: Not on file   Substance and Sexual Activity    Alcohol use: Not on file    Drug use: Not on file    Sexual activity: Not on file   Other Topics Concern    Not on file   Social History Narrative    Not on file     Social Determinants of Health     Financial Resource Strain: Low Risk  (04/06/2022)    Received from [facility]    Overall Financial Resource Strain (CARDIA)     Difficulty of Paying Living Expenses: Not hard at all   Food Insecurity: No Food Insecurity (04/06/2022)    Received from [facility]    Hunger Vital Sign     Worried About Running Out of Food in the Last Year: Never  true     Ran Out of Food in the Last Year: Never true   Transportation Needs: Unknown (04/06/2022)    Received from [facility]    PRAPARE - Therapist, art (Medical): Not on file     Lack of Transportation (Non-Medical): No   Physical Activity: Not on file   Stress: Not on file   Social Connections: Not on file   Intimate Partner Violence: Not on file   Housing Stability: Not on file     His children JJ and Arnette Felts him    FH:   None contributory    Allergies:  No Known Allergies    Home Meds:  Previous Medications    No medications on file       Inpatient Meds:  Scheduled:   electrolyte-R (pH 7.4)  1,000 mL Intravenous Once    metroNIDAZOLE (iso-osm)  500 mg Intravenous Q8H    sodium chloride 0.9 %  Continuous:   electrolyte      norepinephrine 20 mcg/min (08/18/2022 0002)       YQM:VHQION chloride 0.9 %    ROS:   Review of Systems   Constitutional:  Negative for activity change, chills and fever.   HENT:  Negative for congestion, rhinorrhea, sneezing and sore throat.    Respiratory:  Negative for chest tightness and shortness of breath.    Cardiovascular:  Negative for chest pain and leg swelling.   Gastrointestinal:  Positive for abdominal pain and diarrhea. Negative for anal bleeding and blood in stool.   Musculoskeletal:  Negative for back pain and neck pain.   Skin:  Negative for color change and wound.   Neurological:  Positive for dizziness and light-headedness. Negative for headaches.   Psychiatric/Behavioral:  Negative for agitation, behavioral problems and confusion.    All other systems reviewed and are negative.      Vital Signs     Vitals:    08/06/2022 0002   BP: (!) 72/48   Pulse:    Resp:    Temp:    SpO2:         Physical Exam     Physical Exam    Laboratory Data           Invalid input(s): CO2ART, HBO2PE      Lab 08/17/2022  1944   WBC 19.5*   HEMOGLOBIN 7.3*   HEMATOCRIT 22.8*   MEAN CORPUSCULAR VOLUME 90.9   PLATELETS 137*         Lab  07/21/2022  1944   SODIUM 139   POTASSIUM 4.0   CHLORIDE 99   CO2 15*   BUN 41*   CREATININE 4.43*   GLUCOSE 85   CALCIUM 9.1   MAGNESIUM 2.1   PHOSPHORUS 5.1*             Lab 07/20/2022  1954 08/07/2022  1944   ALT 321* 322*   AST 285* 285*   ALK PHOS 87 85   BILIRUBIN TOTAL 0.8 0.8   BILIRUBIN DIRECT 0.2 0.2   ALBUMIN 3.6 3.6           Invalid input(s): KEYTONESU        Imaging Studies     CT Abdomen and Pelvis With IV contrast    Result Date: 07/23/2022  EXAM: CT CHEST WITH CONTRAST EXAM: CT ABDOMEN AND PELVIS WITH CONTRAST INDICATION: Respiratory illness, nondiagnostic xray COMPARISON: None. TECHNIQUE: Helically acquired CT images from the lung apices through the pelvis was obtained in the supine position after the administration of IV contrast. Additional MIP images of the chest were performed. CONTRAST: 100 mL of IOHEXOL 350 MG IODINE/ML INTRAVENOUS SOLUTION administered intravenously FIELD OF VIEW: 40 cm.  FINDINGS: CHEST: LOWER NECK: Subcentimeter bilateral thyroid nodules, likely benign. HEART: The heart is normal in size. Severe aortic valvular calcifications. Moderate coronary artery calcifications. Mitral annular calcifications. THORACIC AORTA: Normal caliber and contour. Extensive calcified atherosclerotic plaque noted throughout the aorta and its branches with eccentric plaque in the proximal arch. PULMONARY ARTERIES: Normal in caliber. MEDIASTINUM AND HILA: No pathologically enlarged lymph nodes are identified. Patulous esophagus with retained contents. CHEST WALL AND AXILLA: Unremarkable. PLEURA: No pleural effusions or pneumothorax. AIRWAYS & LUNGS: The central airways are patent. Partial right lung resections, suspect right upper lobectomy and right middle lobe wedge resection, with surrounding parenchymal bands and architectural distortion. Irregular 1.6 cm opacity along the lateral margin of the right middle lobe wedge resection (series 304 image  127). Anterior and posterior right lower lobe  tree-in-bud nodularity. Mild bibasilar bandlike opacities, likely atelectasis or scarring. OSSEOUS STRUCTURES: Numerous lucent lesions for example within the lateral right eighth rib. Mild multilevel degenerative disc disease. ABDOMEN/PELVIS: LIVER: Multifocal large areas of heterogeneous and ill-defined right hepatic lobe hypoenhancement. Additional scattered subcentimeter hypodense lesions. BILIARY TREE: No intra or extra-hepatic biliary ductal dilatation. The gallbladder is unremarkable. SPLEEN: Not enlarged. Patchy splenic hypoattenuation including a linear superior splenic focus measuring 2 cm (series 305 image 28). PANCREAS: Unremarkable. ADRENAL GLANDS: Unremarkable. KIDNEYS/URETERS/BLADDER: Multiple heterogeneously enhancing bilateral renal lesions, a partially cystic right interpolar lesion measuring 2.2 x 1.9 cm (series 305 image 36) and left interpolar lesion measuring 1.6 x 1.3 cm (series 305 image 49) are representative examples. Patchy renal cortical hypoenhancement bilaterally. No hydronephrosis. Urinary bladder is decompressed limiting evaluation. GASTROINTESTINAL TRACT: Wall thickening of the transverse colon to the rectum with wall hypoenhancement. The appendix is not visualized. Large colonic stool burden with 7 cm rectal stool ball. LYMPHATICS: No abdominal or pelvic lymph nodes are enlarged by size criteria. VASCULATURE: Tortuosity of the abdominal aorta with extensive calcified and noncalcified atherosclerotic plaque. 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 Eccentric noncalcified filling defect in the infrarenal abdominal aorta (series 602 image 58). IMA is not well-visualized. PERITONEUM: Patchy ill-defined mesenteric and omental attenuation, for example in the left upper quadrant on series 305 image 58. Trace ascites. No free air or loculated fluid collections. ABDOMINAL WALL/SOFT  TISSUES: Unremarkable. GENITAL STRUCTURES: Prostatomegaly. 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 Adjacent round hyperdensity measuring 9 mm (series 305 image 107). 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 Numerous osteolytic lesions in the thoracolumbar spine and bilateral pelvis, for example 2 cm right posterior iliac lesion (series 305 image 85).     IMPRESSION: Chest 1.  Right lower lobe tree-in-bud nodularity, likely infectious or inflammatory including aspiration. 2.  Right upper lobectomy and right middle lobe wedge resection with irregular opacity along the lateral margin of the right middle lobe/resection. Favor postoperative however recommend correlation with outside prior imaging. 3.  Findings suggestive of gastroesophageal reflux. 4.  Scattered lucent osseous lesions concerning for metastases given abdominal findings. Abdomen and Pelvis 1.  Nonenhancement of the thickened colonic wall extending from the mid transverse colon to the rectum, highly concerning for bowel ischemia. Extensive atherosclerosis with eccentric noncalcified infrarenal abdominal aorta filling defect which could represent mural thrombus and a potential source. 2.  Multiple heterogeneously enhancing bilateral renal lesions concerning for malignancy with differential to include primary renal neoplasms versus multiple metastases. 3.  Multifocal large areas of ill-defined right hepatic lobe hypoenhancement with additional subcentimeter hypodense lesions. Given additional findings in the abdomen and pelvis, these are suspicious for hepatic metastases. 4.  Patchy mesenteric and omental attenuation including a nondependent portions of the abdomen raise concern for peritoneal carcinomatosis versus patchy mesenteric edema related to the bowel ischemia. 5.  Numerous thoracolumbar spine and bilateral pelvic osteolytic lesions suspicious for osseous metastases. 6.  Patchy  splenic hypoattenuation is most likely related to contrast phase timing. Metastases considered less likely. 7.  Findings discussed above concerning for multifocal malignancy should be compared to the outside prior imaging. 8.  Patchy renal cortical hypoenhancement bilaterally may relate to ATN or infarcts. 9.  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 10.  Right inguinal canal soft  tissue density, suspect retractile testicle. Associated 9 mm hyperdensity could represent a testicular lesion. Recommend correlation with sonographic evaluation. 11.  Large colonic stool burden with 7 cm rectal stool ball. CRITICAL RESULT: Bowel ischemia.  This finding was discussed with Shela Leff, MD on  07/23/2022 10:27 PM EDT by telephone. They confirmed that they understood the findings communicated to them.  #[id number]# Approved by Inda Merlin, MD on 08/09/2022 10:28 PM EDT I have personally reviewed the images and I agree with this report. Report Verified by: Melodye Ped, MD at 07/28/2022 10:39 PM EDT    CT Chest With IV contrast    Result Date: 08/18/2022  EXAM: CT CHEST WITH CONTRAST EXAM: CT ABDOMEN AND PELVIS WITH CONTRAST INDICATION: Respiratory illness, nondiagnostic xray COMPARISON: None. TECHNIQUE: Helically acquired CT images from the lung apices through the pelvis was obtained in the supine position after the administration of IV contrast. Additional MIP images of the chest were performed. CONTRAST: 100 mL of IOHEXOL 350 MG IODINE/ML INTRAVENOUS SOLUTION administered intravenously FIELD OF VIEW: 40 cm.  FINDINGS: CHEST: LOWER NECK: Subcentimeter bilateral thyroid nodules, likely benign. HEART: The heart is normal in size. Severe aortic valvular calcifications. Moderate coronary artery calcifications. Mitral annular calcifications. THORACIC AORTA: Normal caliber and contour. Extensive calcified atherosclerotic plaque noted throughout the aorta and its  branches with eccentric plaque in the proximal arch. PULMONARY ARTERIES: Normal in caliber. MEDIASTINUM AND HILA: No pathologically enlarged lymph nodes are identified. Patulous esophagus with retained contents. CHEST WALL AND AXILLA: Unremarkable. PLEURA: No pleural effusions or pneumothorax. AIRWAYS & LUNGS: The central airways are patent. Partial right lung resections, suspect right upper lobectomy and right middle lobe wedge resection, with surrounding parenchymal bands and architectural distortion. Irregular 1.6 cm opacity along the lateral margin of the right middle lobe wedge resection (series 304 image 127). Anterior and posterior right lower lobe tree-in-bud nodularity. Mild bibasilar bandlike opacities, likely atelectasis or scarring. OSSEOUS STRUCTURES: Numerous lucent lesions for example within the lateral right eighth rib. Mild multilevel degenerative disc disease. ABDOMEN/PELVIS: LIVER: Multifocal large areas of heterogeneous and ill-defined right hepatic lobe hypoenhancement. Additional scattered subcentimeter hypodense lesions. BILIARY TREE: No intra or extra-hepatic biliary ductal dilatation. The gallbladder is unremarkable. SPLEEN: Not enlarged. Patchy splenic hypoattenuation including a linear superior splenic focus measuring 2 cm (series 305 image 28). PANCREAS: Unremarkable. ADRENAL GLANDS: Unremarkable. KIDNEYS/URETERS/BLADDER: Multiple heterogeneously enhancing bilateral renal lesions, a partially cystic right interpolar lesion measuring 2.2 x 1.9 cm (series 305 image 36) and left interpolar lesion measuring 1.6 x 1.3 cm (series 305 image 49) are representative examples. Patchy renal cortical hypoenhancement bilaterally. No hydronephrosis. Urinary bladder is decompressed limiting evaluation. GASTROINTESTINAL TRACT: Wall thickening of the transverse colon to the rectum with wall hypoenhancement. The appendix is not visualized. Large colonic stool burden with 7 cm rectal stool ball. LYMPHATICS:  No abdominal or pelvic lymph nodes are enlarged by size criteria. VASCULATURE: Tortuosity of the abdominal aorta with extensive calcified and noncalcified atherosclerotic plaque. 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 Eccentric noncalcified filling defect in the infrarenal abdominal aorta (series 602 image 58). IMA is not well-visualized. PERITONEUM: Patchy ill-defined mesenteric and omental attenuation, for example in the left upper quadrant on series 305 image 58. Trace ascites. No free air or loculated fluid collections. ABDOMINAL WALL/SOFT TISSUES: Unremarkable. GENITAL STRUCTURES: Prostatomegaly. 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 Adjacent round hyperdensity measuring 9 mm (series 305 image  107). 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 Numerous osteolytic lesions in the thoracolumbar spine and bilateral pelvis, for example 2 cm right posterior iliac lesion (series 305 image 85).     IMPRESSION: Chest 1.  Right lower lobe tree-in-bud nodularity, likely infectious or inflammatory including aspiration. 2.  Right upper lobectomy and right middle lobe wedge resection with irregular opacity along the lateral margin of the right middle lobe/resection. Favor postoperative however recommend correlation with outside prior imaging. 3.  Findings suggestive of gastroesophageal reflux. 4.  Scattered lucent osseous lesions concerning for metastases given abdominal findings. Abdomen and Pelvis 1.  Nonenhancement of the thickened colonic wall extending from the mid transverse colon to the rectum, highly concerning for bowel ischemia. Extensive atherosclerosis with eccentric noncalcified infrarenal abdominal aorta filling defect which could represent mural thrombus and a potential source. 2.  Multiple heterogeneously enhancing bilateral  renal lesions concerning for malignancy with differential to include primary renal neoplasms versus multiple metastases. 3.  Multifocal large areas of ill-defined right hepatic lobe hypoenhancement with additional subcentimeter hypodense lesions. Given additional findings in the abdomen and pelvis, these are suspicious for hepatic metastases. 4.  Patchy mesenteric and omental attenuation including a nondependent portions of the abdomen raise concern for peritoneal carcinomatosis versus patchy mesenteric edema related to the bowel ischemia. 5.  Numerous thoracolumbar spine and bilateral pelvic osteolytic lesions suspicious for osseous metastases. 6.  Patchy splenic hypoattenuation is most likely related to contrast phase timing. Metastases considered less likely. 7.  Findings discussed above concerning for multifocal malignancy should be compared to the outside prior imaging. 8.  Patchy renal cortical hypoenhancement bilaterally may relate to ATN or infarcts. 9.  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 10.  Right inguinal canal soft tissue density, suspect retractile testicle. Associated 9 mm hyperdensity could represent a testicular lesion. Recommend correlation with sonographic evaluation. 11.  Large colonic stool burden with 7 cm rectal stool ball. CRITICAL RESULT: Bowel ischemia.  This finding was discussed with Shela Leff, MD on  08/04/2022 10:27 PM EDT by telephone. They confirmed that they understood the findings communicated to them.  #[id number]# Approved by Inda Merlin, MD on 07/29/2022 10:28 PM EDT I have personally reviewed the images and I agree with this report. Report Verified by: Melodye Ped, MD at 07/22/2022 10:39 PM EDT    X-ray Portable Chest    Result Date: 08/10/2022  EXAM: XR PORTABLE CHEST INDICATION: hypotension TECHNIQUE: 1 view of the chest. COMPARISON: None. FINDINGS: Medical Devices: None. Heart and Mediastinum:  Cardiac size is within normal limits. Calcified atherosclerosis at the aortic knob. Lungs and Pleura: Hypoexpanded lungs with mild bibasilar parenchymal opacities, likely atelectasis. Bones and soft tissues: No acute abnormalities.     IMPRESSION: No acute cardiopulmonary abnormality. Report Verified by: Melodye Ped, MD at 08/02/2022 8:21 PM EDT     Assessment and Plan   Lashun Craver is a 82 y.o. 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     I spoke at length with the patient and his family. None were previously aware of any cancer diagnosis apart from his lung cancer which was resected. In then setting of this, I would not recommend pursuing surgical treatment of the abdomen. The patient and family were in agreement with this. We discussed continuing maximal medical management at this point, including  antibiotics, fluid resuscitation, pressors and other supportive measures. The patient reports that his code status should be DNR, but would accept intubation at this point, if felt necessary.     Please call if ACS can assist in any way.     Dorita Fray MD  Acute Care Surgeon  Division of Trauma, Surgical Critical Care, and Acute Care Surgery  [facility]

## 2022-07-31 NOTE — Nursing Note (Signed)
Pt admitted to ICU room 216. Levophed infusing; Vaso gtt initiated via R IJ CVC. Pt is A/Ox2; requires frequent reorientation. Pt c/o 5/10 discomfort to mid abd- PRN Dilaudid administered. 2L NC in place (baseline per pt).

## 2022-07-31 NOTE — Nursing Note (Addendum)
[facility] hospitalist Dr Claybon Jabs coming to bedside to pronounce.

## 2022-07-31 NOTE — Nursing Note (Signed)
Pt in agreement for comfort care. This RN took over care for patient at that time. This RN gave pt  5 mg Morphine for comfort with RR 22. This RN turned off vasopressin with administration of morphine. Approximately 8 min later bicarb was stopped. Family asked that levo remain on until daughter in law arrived. Around 1645 daughter asked that the levo be turned off at that time. This RN turned off levo. Will monitor

## 2022-07-31 NOTE — ED Notes (Signed)
Family at bedside. 

## 2022-07-31 NOTE — ACP (Advance Care Planning) (Signed)
Medical Orders for Life-Sustaining Treatment Discussion    In the event of a medical emergency, the patient has voiced these wishes:    Cardiopulmonary Resuscitation (CPR)  If there is no pulse and no breathing: Do NOT attempt resuscitation/DNR, no CPR.    Medical Interventions  If there is a pulse and/or breathing: Full intervention including intubation, mechanical ventilation, cardioversion, and transfer to intensive care as indicated.    Antibiotics/Hydration  Antibiotics: Use antibiotics if clinically indicated.        These wishes were discussed with Patient and children, Raynelle Fanning and Fayrene Fearing.       Staff present for discussion included: Ryden Wainer    Time spent in conversation:  15 minutes

## 2022-07-31 NOTE — Procedures (Incomplete)
[facility] ED Procedure Note    Emergency Department Procedures     Central Line    Date/Time: 07/23/2022 1:31 AM    Performed by: Thera Flake, MDConsent: Written consent obtained.  Risks and benefits: risks, benefits and alternatives were discussed  Consent given by: power of attorney    Catheter type: triple lumen  Indication(s): vascular access  Patient location at time of line placement: ED  Conditions of line placement: sterile  Preparation: skin prepped with 2% chlorhexidine  Location details: right internal jugular  Catheter size: 12 Fr  Catheter fully inserted (hub at skin): yes  Insertion guided by: ultrasound guided  Number of attempts: 1  Successful placement: yes  Sutured: 2-0  Post procedure chest x-ray ordered: yes  Catheter tip location on chest x-ray: right atrium  Complications: none      Arterial Line    Date/Time: 08/01/2022 1:32 AM    Performed by: Thera Flake, MD  Authorized by: Caleb Popp, MD    Consent:     Consent obtained:  Written    Consent given by:  Healthcare agent    Risks, benefits, and alternatives were discussed: yes    Universal protocol:     Patient identity confirmed:  Arm band  Indications:     Indications: hemodynamic monitoring    Pre-procedure details:     Skin preparation:  Chlorhexidine    Preparation: Patient was prepped and draped in sterile fashion    Sedation:     Sedation type:  None  Anesthesia:     Anesthesia method:  Local infiltration    Local anesthetic:  Lidocaine 1% w/o epi  Procedure details:     Location:  L radial    Needle gauge:  18 G    Placement technique:  Ultrasound guided    Number of attempts:  2  Post-procedure details:     Post-procedure:  Biopatch applied    CMS:  Normal  Comments:      Failed attempts x 2 (x 1 by Me, x 1 by attending Dr. Olegario Shearer), patient with multiple calcifications unable to thread wire, attempt aborted      Procedure addendum:    I was present throughout central line  placement

## 2022-07-31 NOTE — Procedures (Signed)
Charles Drake is a 82 y.o. male patient.  1. Mesenteric ischemia (CMS-HCC)      No past medical history on file.  Blood pressure 106/61, pulse 89, temperature 97.5 F (36.4 C), temperature source Oral, resp. rate 28, height 5' 10 (1.778 m), weight 129 lb 10.1 oz (58.8 kg), SpO2 (!) 89%.       Insert Arterial Line    Date/Time: 08/07/2022 6:33 AM    Performed by: Debbra Riding, MD  Authorized by: Debbra Riding, MD    Consent:     Consent obtained:  Verbal    Consent given by:  Patient  Universal protocol:     Patient identity confirmed:  Arm band  Indications:     Indications: hemodynamic monitoring    Pre-procedure details:     Skin preparation:  Chlorhexidine  Sedation:     Sedation type:  None  Anesthesia:     Anesthesia method:  Local infiltration    Local anesthetic:  Lidocaine 1% w/o epi  Procedure details:     Location:  R femoral    Allen's test performed: no      Needle gauge:  20 G    Placement technique:  Ultrasound guided    Number of attempts:  2    Transducer: waveform confirmed    Post-procedure details:     Post-procedure:  Sterile dressing applied    CMS:  Normal    Procedure completion:  Tolerated      [name] [facility]  07/28/2022

## 2022-07-31 NOTE — Consults (Signed)
CRITICAL CARE Initial Consult Note                                                              Encounter date:  07/28/2022  Name: Charles Drake Date of Admit: 08/03/2022   Age/Gender: 82 y.o. male  LOS: 0 days   MRN: [id number] Consulted by: Attending Caleb Popp, MD   Location: [facility] Patient's PCP: Augusto Garbe, MD     Reason for Consult: Shock    Subjective       Patient prior care was in [place] moved here about 6 months ago.     Charles Drake is a 82 y.o. male with history of ESRD on iHD, chronic hypotension on midodrine; aortic stenosis; paroxysmal atrial fibrillation; pulmonary hypertension (RHC - 2020 - PA-70/15; PCWP - 13);  hx of GI bleed / doudenal AVMs; COPD on chronic oxygen @ 2, lung cancer s/p RLL resection (? 2018) who presents with dizziness ; lethargy and abdominal discomfort since yesterday.  Feels it started after his dialysis session.   Patient in profound shock on admission; lactate~12; required levo at 30 after giving 3 L of IV fluids.  CT concerning for ischemic colitis with possible metastatic hepatic / peritoneal and osseous lesions and atherosclerotic disease; tree-in-bud opacities in right lower lobe.   Patient seen by acute care surgery deemed to be a very poor surgical candidate.  He opted to be DNR CCA be managed medically.     Patient prior care was in [place] moved here about 6 months ago.     Consults:  ED CONTACT PROVIDER [facility]      ROS   Negative except as mentioned in HPI    Past History:  No past medical history on file.   No past surgical history on file.      Family & Social Hx:  No family history on file.   Social History     Socioeconomic History    Marital status: Widowed     Spouse name: Not on file    Number of children: Not on file    Years of education: Not on file    Highest education level: Not on file   Occupational History    Not on file   Tobacco Use    Smoking status: Not on file    Smokeless tobacco: Not on file   Substance and Sexual Activity     Alcohol use: Not on file    Drug use: Not on file    Sexual activity: Not on file   Other Topics Concern    Not on file   Social History Narrative    Not on file     Social Determinants of Health     Financial Resource Strain: Low Risk  (04/06/2022)    Received from [facility]    Overall Financial Resource Strain (CARDIA)     Difficulty of Paying Living Expenses: Not hard at all   Food Insecurity: No Food Insecurity (04/06/2022)    Received from [facility]    Hunger Vital Sign     Worried About Running Out of Food in the Last Year: Never true     Ran Out of Food in the Last Year: Never true  Transportation Needs: Unknown (04/06/2022)    Received from [facility]    PRAPARE - Therapist, art (Medical): Not on file     Lack of Transportation (Non-Medical): No   Physical Activity: Not on file   Stress: Not on file   Social Connections: Not on file   Intimate Partner Violence: Not on file   Housing Stability: Not on file        Allergy:  No Known Allergies     Objective       Vitals & Physical Exam  Temp:  [96.1 F (35.6 C)] 96.1 F (35.6 C)  Heart Rate:  [87-139] 111  Resp:  [18-39] 32  BP: (45-111)/(21-67) 111/58  O2 Sat: (!) 83%       Body mass index is 15.78 kg/m.  Weight change:   Wt Readings from Last 3 Encounters:   07/20/2022 110 lb (49.9 kg)       Intake/Output Summary (Last 24 hours) at 08/04/2022 0121  Last data filed at 07/24/2022 0110  Gross per 24 hour   Intake 3186.15 ml   Output --   Net 3186.15 ml        Physical Exam   General:  Ill-appearing male, tachypneic; cachectic looking  Neck:    Supple, no JVD, no adenopathy,  no thyromegaly.   CVS:   RRR, no MRG. Pulses 2+, equal.  Chest/Pulm:  Decreased bilaterally  Abdominal:  Diffuse tenderness present  Extremities: Right forearm AV fistula  Neurological: Alert and orientedx3. No focal motor deficits, CN - grossly intact.  Skin:   Warm, dry. No rashes or  lesions.    Medications  (Not in a hospital admission)    Scheduled Meds:   metroNIDAZOLE (iso-osm)  500 mg Intravenous Q8H    sodium chloride 0.9 %         Continuous Infusions:   electrolyte 125 mL/hr (08/04/2022 0110)    norepinephrine 30 mcg/min (07/24/2022 0053)    vasopressin       PRN Meds: sodium chloride 0.9 %     Labs        Invalid input(s): CO2ART        Lab 07/19/2022  1944   SODIUM 139   POTASSIUM 4.0   CHLORIDE 99   CO2 15*   BUN 41*   CREATININE 4.43*   GLUCOSE 85   MAGNESIUM 2.1           Lab 07/19/2022  1944   WBC 19.5*   RBC 2.51*   HEMATOCRIT 22.8*   MEAN CORPUSCULAR VOLUME 90.9   MCH 29.0   MCHC 32.0   RDW 24.1*   PLATELETS 137*                 Lab 07/26/2022  1954 08/03/2022  1944   ALBUMIN 3.6 3.6   TOTAL PROTEIN 6.0* 6.0*   BILIRUBIN TOTAL 0.8 0.8   BILIRUBIN DIRECT 0.2 0.2   ALT 321* 322*   AST 285* 285*   ALK PHOS 87 85                 Microbiology Results       Date and Time Order Name Sensitivity Status Organisms Specimen ID Source    08/17/2022  7:46 PM #2  Blood culture-Peripheral site 2  In process  [id number] Peripheral    07/23/2022  7:40 PM #1  Blood culture-Peripheral site 1  In process  [id number] Peripheral  Diagnostic studies  Recent Imaging:  X-ray Portable Chest    Result Date: 08/14/2022  IMPRESSION: Right internal jugular central venous catheter tip projects over the right atrium. Approved by Vira Blanco, MD on 08/12/2022 12:16 AM EDT I have personally reviewed the images and I agree with this report. Report Verified by: Charlean Sanfilippo, MD at 07/21/2022 12:27 AM EDT    CT Abdomen and Pelvis With IV contrast    Result Date: 08/15/2022  IMPRESSION: Chest 1.  Right lower lobe tree-in-bud nodularity, likely infectious or inflammatory including aspiration. 2.  Right upper lobectomy and right middle lobe wedge resection with irregular opacity along the lateral margin of the right middle lobe/resection. Favor postoperative however recommend correlation with outside prior imaging. 3.   Findings suggestive of gastroesophageal reflux. 4.  Scattered lucent osseous lesions concerning for metastases given abdominal findings. Abdomen and Pelvis 1.  Nonenhancement of the thickened colonic wall extending from the mid transverse colon to the rectum, highly concerning for bowel ischemia. Extensive atherosclerosis with eccentric noncalcified infrarenal abdominal aorta filling defect which could represent mural thrombus and a potential source. 2.  Multiple heterogeneously enhancing bilateral renal lesions concerning for malignancy with differential to include primary renal neoplasms versus multiple metastases. 3.  Multifocal large areas of ill-defined right hepatic lobe hypoenhancement with additional subcentimeter hypodense lesions. Given additional findings in the abdomen and pelvis, these are suspicious for hepatic metastases. 4.  Patchy mesenteric and omental attenuation including a nondependent portions of the abdomen raise concern for peritoneal carcinomatosis versus patchy mesenteric edema related to the bowel ischemia. 5.  Numerous thoracolumbar spine and bilateral pelvic osteolytic lesions suspicious for osseous metastases. 6.  Patchy splenic hypoattenuation is most likely related to contrast phase timing. Metastases considered less likely. 7.  Findings discussed above concerning for multifocal malignancy should be compared to the outside prior imaging. 8.  Patchy renal cortical hypoenhancement bilaterally may relate to ATN or infarcts. 9.  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 10.  Right inguinal canal soft tissue density, suspect retractile testicle. Associated 9 mm hyperdensity could represent a testicular lesion. Recommend correlation with sonographic evaluation. 11.  Large colonic stool burden with 7 cm rectal stool ball. CRITICAL RESULT: Bowel ischemia.  This finding was discussed with Charles Leff, MD on   07/28/2022 10:27 PM EDT by telephone. They confirmed that they understood the findings communicated to them.  #[id number]# Approved by Inda Merlin, MD on 08/18/2022 10:28 PM EDT I have personally reviewed the images and I agree with this report. Report Verified by: Melodye Ped, MD at 08/09/2022 10:39 PM EDT    CT Chest With IV contrast    Result Date: 08/11/2022  IMPRESSION: Chest 1.  Right lower lobe tree-in-bud nodularity, likely infectious or inflammatory including aspiration. 2.  Right upper lobectomy and right middle lobe wedge resection with irregular opacity along the lateral margin of the right middle lobe/resection. Favor postoperative however recommend correlation with outside prior imaging. 3.  Findings suggestive of gastroesophageal reflux. 4.  Scattered lucent osseous lesions concerning for metastases given abdominal findings. Abdomen and Pelvis 1.  Nonenhancement of the thickened colonic wall extending from the mid transverse colon to the rectum, highly concerning for bowel ischemia. Extensive atherosclerosis with eccentric noncalcified infrarenal abdominal aorta filling defect which could represent mural thrombus and a potential source. 2.  Multiple heterogeneously enhancing bilateral renal lesions concerning for malignancy with differential to include primary renal neoplasms versus multiple metastases. 3.  Multifocal large areas of ill-defined right hepatic lobe hypoenhancement with additional subcentimeter hypodense lesions. Given additional findings in the abdomen and pelvis, these are suspicious for hepatic metastases. 4.  Patchy mesenteric and omental attenuation including a nondependent portions of the abdomen raise concern for peritoneal carcinomatosis versus patchy mesenteric edema related to the bowel ischemia. 5.  Numerous thoracolumbar spine and bilateral pelvic osteolytic lesions suspicious for osseous metastases. 6.  Patchy splenic hypoattenuation is most likely related to contrast phase timing.  Metastases considered less likely. 7.  Findings discussed above concerning for multifocal malignancy should be compared to the outside prior imaging. 8.  Patchy renal cortical hypoenhancement bilaterally may relate to ATN or infarcts. 9.  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 10.  Right inguinal canal soft tissue density, suspect retractile testicle. Associated 9 mm hyperdensity could represent a testicular lesion. Recommend correlation with sonographic evaluation. 11.  Large colonic stool burden with 7 cm rectal stool ball. CRITICAL RESULT: Bowel ischemia.  This finding was discussed with Charles Leff, MD on  07/28/2022 10:27 PM EDT by telephone. They confirmed that they understood the findings communicated to them.  #[id number]# Approved by Inda Merlin, MD on 08/06/2022 10:28 PM EDT I have personally reviewed the images and I agree with this report. Report Verified by: Melodye Ped, MD at 07/22/2022 10:39 PM EDT    X-ray Portable Chest    Result Date: 08/16/2022  IMPRESSION: No acute cardiopulmonary abnormality. Report Verified by: Melodye Ped, MD at 08/04/2022 8:21 PM EDT          No data to display              Right heart cath-January 2020  Right Heart Pressures   RA (mean): 10 mmHg   RV (S/EDP): 70/10 mmHg   PA (S/D, mean): 70/15 mmHg   PCWP (mean): 13 mmHg     Ao sat: 95%   PA sat: 55%     Fick CO: 5.7 L/min   Fick CI: 2.7 L/min/m^2     Thermodilution CO: 5.1 L/min   Thermodilution CI: 2.4 L/min/m^2     PVR: 3.5 (Fick) and 3.9 (thermodilution) Wood units     Assessment & Plan       Endrit Goodbar is a 82 y.o. 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  Infrarenal mural thrombus  History of lung cancer s/p resection  Multifocal atherosclerosis      PLAN    CVS/  Hemodynamics:  Profound shock- continue levo-titrate for MAP more than 60.  Add vasopressin and stress dose steroids  Frequent PVCs-monitor for now  High troponins likely due to demand -monitor; get echo    Respiratory:  Scheduled DuoNebs    Gastrointestinal:  ACS following; poor surgical candidate -nonop management  Elevated LFTs likely due to shock -monitor  GI Ulcer Prophylaxis: PPI    Renal/ Fluids & electrolytes:  Continue IV fluids at 125 cc/h  Chronic dialysis patient/need for urgent renal replacement therapy; will consider starting CRRT if needed but may not tolerate it well if pressor requirement continues to be high    Hematologic/ Oncologic:   ?  Infrarenal mural thrombus on CT; patient anemic with acute abdominal-will hold off on starting therapeutic anticoagulation at this time.  Transfuse as needed keep hemoglobin > 7-8  DVT Prophylaxis: SQ UFH    Endocrine:   No acute issues    Infectious Disease:   Continue IV vancomycin; cefepime and Flagyl.  Follow blood cultures    Neurologic/ Psychiatric, Pain & sedation:  Delirium precautions  As needed Dilaudid for pain    Nutrition:  N.p.o.   Diet/Nutrition Orders   None          Lines/ Drains/Foley:   Patient Lines/Drains/Airways Status       Active Line / PIV Line       Name Placement date Placement time Site Days    CVC Triple Lumen 07/23/2022 Right Internal jugular 07/20/2022  2327  Internal jugular  less than 1    Peripheral IV 07/24/2022 Anterior;Left Forearm 08/09/2022  1900  Forearm  less than 1    Peripheral IV 08/08/2022 Left Antecubital 07/21/2022  2033  Antecubital  less than 1                    Foley: None   PLAN: Continue above lines    Mobility:Bedrest  Code Status: DNRCC-A; okay for short-term intubation if needed  Disposition: Remain in ICU      I spent a total of 65 minutes of critical care time caring for patient Charles Drake with diagnoses as listed above, including direct patient contact, management of life support systems review of data (i.e.:  imaging and lab), discussion with team members, and this time excludes time spent on procedures.      Debbra Riding, MD  Pulmonary-Critical Care  08/12/2022 1:21 AM

## 2022-07-31 NOTE — Nursing Note (Signed)
Nurse observed significant change in pt's mentation and neurovascular assignment. Patient's words became more garbled, significant weakness in his left side, facial expression still symmetrical. MD notified.

## 2022-07-31 NOTE — Plan of Care (Signed)
ADDITIONAL CRITICAL CARE PROVIDED  MICU Attending Note    I independently examined and evaluated the patient. The X-rays, and labs were reviewed. The case was discussed in detail with the MICU house staff and a plan for medical care was arranged. This note may contain information copied forward from prior however all information has been reviewed and edited daily. I have reviewed and updated the history, physical exam, data, assessment, and plan of this note completely so that it reflects the evaluation and management of the patient tonight.     Additional critical care time spent toward: management of shock, hypoglycemia and encephalopathy    Patient with ischemic colitis. Not a good surgical candidate. Lactate climbing, currently at 19. Worsening metabolic encephalopathy.    Assessment and Plan:  Plan:  - 1 unit RBC transfusion  - Bicarb gtt with D5  - wean levo/vaso as tolerated Map goal > 60  - ABG  - Continue empiric abx  - Continue stress dose steroids  - Discussed with daughter at bedside most recent updates. Medical management is supportive and does not fix the root cause of his acute illness. She understands that the patient is not a good candidate for surgery. Discussed option for comfort care.  - Very Poor prognosis  - DNR-CCA      I spent a total of 30 additional minutes of critical care time overnight caring for this patient with residents/fellows, including direct patient contact, management of life supporting systems, review of data such as imaging and labs, discussion with patient's family members and care oriented discussions with team members. This time excludes time spend performing procedures.

## 2022-07-31 NOTE — Nursing Note (Signed)
Pt moving to morgue at this time. Patient safety called and will meet this RN and another RN @ [address].

## 2022-07-31 NOTE — Progress Notes (Signed)
S: admitted after MN.  Chart reviewed patient seen and examined he is comfortable for now on multiple pressors  He does feel like he is dying.   O  Vitals:    08/13/2022 1158   BP: (!) 99/38   Pulse:    Resp:    Temp:    SpO2:      Gen: ill appearing  Abd: diffusley tender    A/P    Septic shock  Ischemic bowel  ESRD on HD  Widespread metastatic disease  Pneumonia  Right common iliac artery dissection  Constipation      Discussed hospice with patient and his daughter they are going to discuss amongst family members.  He is actively dying, pt is aware of this and mentioned to me he was preparing for the end.        Mardene Speak DO  07/25/2022 12:16 PM

## 2022-07-31 NOTE — H&P (Signed)
[employer]  History and Physical    Admit Date:   08/13/2022    Patient's PCP:  Augusto Garbe, MD  DOB:     1940-05-26  Patient Class:  Inpatient    CHIEF COMPLAINT     Chief Complaint   Patient presents with    Hypotension       HISTORY OF PRESENT ILLNESS   Charles Drake is a 82 y.o. male w/ past medical history of ESRD on HD, on chronic oxygen, lung cancer s/p right lower lobe resection, pulmonary htn, aortic stenosis, pAfib, chronic hypotension on midodrine, hx of GI bleed/duodenal AVMs, COPD on chronic oxygen 2L who presented to the ER with hypotension, malaise and abdominal pain.  He had gotten HD yesterday and then afterward, started feeling dizzy and unwell. He called EMS and then upon evaluation in the ER, he was found to have ischemic bowel, hypotension requiring pressors.  CT also showed  possible metastatic hepatic / peritoneal and osseous lesions and atherosclerotic disease; tree-in-bud opacities in right lower lobe. The patient opted for [facility] code status.  He rates his pain now as 5/10 for me.  He is awake and alert and even though in ICU on pressors, surprisingly able to give a history.  He denies nausea, chest pain, but says he does have some mod abd pain.            PAST MEDICAL HISTORY     Past Medical History:   Diagnosis Date    Aortic stenosis     Chronic hypotension     Chronic respiratory failure with hypoxia (CMS-HCC)     on 2L    ESRD on hemodialysis (CMS-HCC)     GI bleed     Lung cancer (CMS-HCC)     Paroxysmal A-fib (CMS-HCC)        PAST SURGICAL HISTORY     Past Surgical History:   Procedure Laterality Date    ESOPHAGOGASTRODUODENOSCOPY      PROSTATE BIOPSY      THORACOSCOPY LOBECTOMY VIDEO ASSISTED      TONSILLECTOMY         MEDICATIONS     No medications prior to admission.         Current Facility-Administered Medications   Medication Dose Frequency Provider Last Admin    ceFEPime (MAXIPIME) IV extended infusion  1 g Q24H Abhishek Singla, MD      dextrose 10% in  water  12.5 g Q15 Min PRN Johann Capers, MD      Or    dextrose 10% in water  25 g Q15 Min PRN Johann Capers, MD      dextrose 20% in water  0-100 mL/hr Continuous Debbra Riding, MD Rate Verify at 07/26/2022 0636    electrolyte  125 mL/hr Continuous Dorita Fray, MD Rate Verify at 08/07/2022 0636    glucose  12 g Q15 Min PRN Johann Capers, MD      heparin  5,000 Units 3 times per day Debbra Riding, MD 5,000 Units at 08/16/2022 0406    hydrocortisone sodium succinate  50 mg Q6H Abhishek Singla, MD 50 mg at 08/17/2022 1610    HYDROmorphone  0.5 mg Q4H PRN Debbra Riding, MD 0.5 mg at 08/04/2022 0428    Or    HYDROmorphone  1 mg Q4H PRN Abhishek Singla, MD      ipratropium-albuteroL  3 mL RT Q4H WA Debbra Riding, MD      metroNIDAZOLE (iso-osm)  500 mg Q8H Dyke Maes  Margarette Asal, MD Stopped at 08/10/2022 0132    norepinephrine  0-40 mcg/min Continuous Debbra Riding, MD 18 mcg/min at 07/22/2022 0636    pantoprazole (PROTONIX) IV  40 mg DAILY 0600 Debbra Riding, MD 40 mg at 08/11/2022 0406    sodium chloride 0.9 %     Held at 08/05/2022 0027    vasopressin  0.03 Units/min Continuous Caleb Popp, MD 0.03 Units/min at 08/09/2022 0340       ALLERGIES   Patient has no known allergies.    SOCIAL HISTORY   TOBACCO:  reports that he has quit smoking. His smoking use included cigarettes. He does not have any smokeless tobacco history on file.       ETOH:   has no history on file for alcohol use.  DRUG:    Social History     Substance and Sexual Activity   Drug Use Not on file       FAMILY HISTORY     Family History   Problem Relation Age of Onset    Diabetes Mother     Cerebral aneurysm Mother     Lung Cancer Father     Diabetes Sister          REVIEW OF SYSTEMS     General: felt very weak and fatigued.  ENT: no sore throat or nasal congestion  Cardiac: No chest pain or palpitations.  Pulm: No shortness of breath or wheezing.  SA:[id number] pain, no diarrhea.    Musculoskeletal: No leg edema or muscle pain.  Neuro: No dizziness/lightheadedness or  HA.   GU: no dysuria or hematuria  Derm: no new rash, no skin lesions  Psych: mood is stable        PHYSICAL EXAM   BP 111/57   Pulse 87   Temp 97.5 F (36.4 C) (Oral)   Resp 27   Ht 5' 10 (1.778 m)   Wt 129 lb 10.1 oz (58.8 kg)   SpO2 (!) 89%   BMI 18.60 kg/m  O2 Flow Rate (L/min): 2 L/min    General Appearance:  Alert, cooperative and able to answer questions appears stated age.     Head:  Normocephalic, atraumatic/without obvious abnormality    Eyes:  Pupils equal, sclera nonicteric   Throat:  Moist mucus membranes    Neck:  Appears normal, trachea midline   Lungs:  Clear to auscultation bilaterally, respirations unlabored    Heart:  Regular rate and rhythm, S1 and S2 normal   Abdomen:  Soft, tender throughout abdomen especially mid low area,  bowel sounds quiet.     Extremities:  Extremities normal, no edema   Pulses:  2+ and symmetric    Skin:  Warm and dry   Neurologic:  Face symmetric, speech fluent, no tremor       LABS     Results for orders placed or performed during the hospital encounter of 08/07/2022   #1  Blood culture-Peripheral site 1    Specimen: Peripheral; Blood   Result Value Ref Range    Culture Result No Growth To Date    #2  Blood culture-Peripheral site 2    Specimen: Peripheral; Blood   Result Value Ref Range    Culture Result No Growth To Date    Basic metabolic panel   Result Value Ref Range    Sodium 139 133 - 146 mmol/L    Potassium 4.0 3.5 - 5.3 mmol/L    Chloride 99 98 - 110 mmol/L  CO2 15 (L) 21 - 33 mmol/L    Anion Gap 25 (H) 3 - 16 mmol/L    BUN 41 (H) 7 - 25 mg/dL    Creatinine 0.98 (H) 0.60 - 1.30 mg/dL    Glucose 85 70 - 119 mg/dL    Calcium 9.1 8.6 - 14.7 mg/dL    Osmolality, Calculated 297 278 - 305 mOsm/kg    EGFR 13    CBC   Result Value Ref Range    WBC 19.5 (H) 3.8 - 10.8 10E3/uL    RBC 2.51 (L) 4.20 - 5.80 10E6/uL    Hemoglobin 7.3 (L) 13.2 - 17.1 g/dL    Hematocrit 82.9 (L) 38.5 - 50.0 %    MCV 90.9 80.0 - 100.0 fL    MCH 29.0 27.0 - 33.0 pg    MCHC 32.0 32.0 -  36.0 g/dL    RDW 56.2 (H) 13.0 - 15.0 %    Platelets 137 (L) 140 - 400 10E3/uL    MPV 7.5 7.5 - 11.5 fL   Differential   Result Value Ref Range    Neutrophils Relative 92.7 (H) 40.0 - 80.0 %    Lymphocytes Relative 3.6 (L) 15.0 - 45.0 %    Monocytes Relative 3.6 0.0 - 12.0 %    Eosinophils Relative 0.0 0.0 - 8.0 %    Basophils Relative 0.1 0.0 - 1.0 %    nRBC 0 0 - 0 /100 WBC    Neutrophils Absolute 18,077 (H) 1,500 - 7,800 /uL    Lymphocytes Absolute 702 (L) 850 - 3,900 /uL    Monocytes Absolute 702 200 - 950 /uL    Eosinophils Absolute 0 (L) 15 - 500 /uL    Basophils Absolute 20 0 - 200 /uL   Hepatic Function Panel   Result Value Ref Range    Total Bilirubin 0.8 0.0 - 1.5 mg/dL    Bilirubin, Direct 0.2 0.0 - 0.4 mg/dL    AST 865 (H) 13 - 39 U/L    ALT 322 (H) 7 - 52 U/L    Alkaline Phosphatase 85 36 - 125 U/L    Total Protein 6.0 (L) 6.4 - 8.9 g/dL    Albumin 3.6 3.5 - 5.7 g/dL    Bilirubin, Indirect 0.7 0.0 - 1.1 mg/dL   Lactic Acid   Result Value Ref Range    Lactate 11.9 (H) 0.5 - 2.2 mmol/L   High Sensitivity Troponin   Result Value Ref Range    High Sensitivity Troponin 321 (HH) 0 - 20 ng/L   High Sensitivity Troponin ( )   Result Value Ref Range    High Sensitivity Troponin 311 (HH) 0 - 20 ng/L   TSH (Thyroid Stimulating Hormone)   Result Value Ref Range    TSH 9.76 (H) 0.45 - 4.12 uIU/mL   T4, free   Result Value Ref Range    Free T4 1.31 0.61 - 1.76 ng/dL   Magnesium   Result Value Ref Range    Magnesium 2.1 1.5 - 2.5 mg/dL   Phosphorus   Result Value Ref Range    Phosphorus 5.1 (H) 2.1 - 4.5 mg/dL   Hepatic Function Panel   Result Value Ref Range    Total Bilirubin 0.8 0.0 - 1.5 mg/dL    Bilirubin, Direct 0.2 0.0 - 0.4 mg/dL    AST 784 (H) 13 - 39 U/L    ALT 321 (H) 7 - 52 U/L    Alkaline Phosphatase 87 36 - 125 U/L  Total Protein 6.0 (L) 6.4 - 8.9 g/dL    Albumin 3.6 3.5 - 5.7 g/dL    Bilirubin, Indirect 0.7 0.0 - 1.1 mg/dL   Lipase   Result Value Ref Range    Lipase 211 (H) 4 - 82 U/L   Lactic  Acid   Result Value Ref Range    Lactate 11.5 (H) 0.5 - 2.2 mmol/L   Basic metabolic panel   Result Value Ref Range    Sodium 139 133 - 146 mmol/L    Potassium 3.5 3.5 - 5.3 mmol/L    Chloride 101 98 - 110 mmol/L    CO2 16 (L) 21 - 33 mmol/L    Anion Gap 22 (H) 3 - 16 mmol/L    BUN 38 (H) 7 - 25 mg/dL    Creatinine 1.61 (H) 0.60 - 1.30 mg/dL    Glucose 54 (L) 70 - 100 mg/dL    Calcium 6.2 (LL) 8.6 - 10.3 mg/dL    Osmolality, Calculated 295 278 - 305 mOsm/kg    EGFR 17    Renal Function Panel w/EGFR, STAT   Result Value Ref Range    Sodium 140 133 - 146 mmol/L    Potassium 3.8 3.5 - 5.3 mmol/L    Chloride 98 98 - 110 mmol/L    CO2 16 (L) 21 - 33 mmol/L    Anion Gap 26 (H) 3 - 16 mmol/L    BUN 41 (H) 7 - 25 mg/dL    Creatinine 0.96 (H) 0.60 - 1.30 mg/dL    Glucose 57 (L) 70 - 100 mg/dL    Calcium 6.8 (L) 8.6 - 10.3 mg/dL    Phosphorus 5.5 (H) 2.1 - 4.5 mg/dL    Albumin 2.5 (L) 3.5 - 5.7 g/dL    Osmolality, Calculated 298 278 - 305 mOsm/kg    EGFR 15    Lactic Acid, STAT   Result Value Ref Range    Lactate 9.8 (H) 0.5 - 2.2 mmol/L   Protime-INR, STAT   Result Value Ref Range    Protime 20.3 (H) 12.1 - 15.1 seconds    INR 1.7 (H) 0.9 - 1.1   Lactic Acid, STAT   Result Value Ref Range    Lactate 13.1 (H) 0.5 - 2.2 mmol/L   Renal Function Panel w/EGFR   Result Value Ref Range    Sodium 139 133 - 146 mmol/L    Potassium 4.3 3.5 - 5.3 mmol/L    Chloride 96 (L) 98 - 110 mmol/L    CO2 17 (L) 21 - 33 mmol/L    Anion Gap 26 (H) 3 - 16 mmol/L    BUN 50 (H) 7 - 25 mg/dL    Creatinine 0.45 (H) 0.60 - 1.30 mg/dL    Glucose 409 (H) 70 - 100 mg/dL    Calcium 8.1 (L) 8.6 - 10.3 mg/dL    Phosphorus 4.9 (H) 2.1 - 4.5 mg/dL    Albumin 3.1 (L) 3.5 - 5.7 g/dL    Osmolality, Calculated 303 278 - 305 mOsm/kg    EGFR 13    CBC   Result Value Ref Range    WBC 10.8 3.8 - 10.8 10E3/uL    RBC 2.53 (L) 4.20 - 5.80 10E6/uL    Hemoglobin 7.5 (L) 13.2 - 17.1 g/dL    Hematocrit 81.1 (L) 38.5 - 50.0 %    MCV 90.7 80.0 - 100.0 fL    MCH 29.6 27.0 - 33.0  pg    MCHC 32.7 32.0 -  36.0 g/dL    RDW 16.1 (H) 09.6 - 15.0 %    Platelets 118 (L) 140 - 400 10E3/uL    MPV 7.5 7.5 - 11.5 fL   Calcium, Free, Whole Blood   Result Value Ref Range    Free Calcium, WB 3.89 (L) 4.50 - 5.30 mg/dL   Differential   Result Value Ref Range    Neutrophils Relative 93.2 (H) 40.0 - 80.0 %    Lymphocytes Relative 3.7 (L) 15.0 - 45.0 %    Monocytes Relative 2.8 0.0 - 12.0 %    Eosinophils Relative 0.2 0.0 - 8.0 %    Basophils Relative 0.1 0.0 - 1.0 %    nRBC 1 (H) 0 - 0 /100 WBC    Neutrophils Absolute 10,345 (H) 1,500 - 7,800 /uL    Lymphocytes Absolute 411 (L) 850 - 3,900 /uL    Monocytes Absolute 311 200 - 950 /uL    Eosinophils Absolute 22 15 - 500 /uL    Basophils Absolute 11 0 - 200 /uL   POC Glucose Monitoring Device   Result Value Ref Range    POC Glucose Monitoring Device 118 (H) 70 - 100 mg/dL   POC Glucose Monitoring Device   Result Value Ref Range    POC Glucose Monitoring Device 76 70 - 100 mg/dL   POC Glucose Monitoring Device   Result Value Ref Range    POC Glucose Monitoring Device 121 (H) 70 - 100 mg/dL         RADIOLOGY   Bedside focused transthoracic echocardiogram    Result Date: 08/17/2022  Focused Echocardiogram Examination     Exam category:  Clinically indicated     Indication(s) for Exam:          Select one or more indications:  hypotension     Findings:          Cardiac activity:  present         Pericardial effusion:  none         Left ventricular systolic function:  normal         IVC:  collapsible >50%     Interpretation:                  Signs of intravascular volume depletion         Comments:  Repeat exam after total of 3L fluid. LV no longer hyperdynamic. IVC remains diminuitive     Attending Attestation:          I (the Attending) was present when the images were obtained by the operator AND/OR I reviewed the images after they were obtained.  I have reviewed the interpretation by the operator, made any necessary edits and agree with the interpretation as  currently documented.  Electronically signed by Shela Leff on Saturday, July 31, 2022 at 3:48 AM    Bedside focused transthoracic echocardiogram    Result Date: 08/14/2022  Focused Echocardiogram Examination     Exam category:  Clinically indicated     Indication(s) for Exam:          Select one or more indications:  hypotension     Findings:          Cardiac activity:  present         Pericardial effusion:  none         Left ventricular systolic function:  hyperdynamic         IVC:  collapsible >50%     Interpretation:  No sonographic evidence of significant pericardial effusion         Signs of intravascular volume depletion     Attending Attestation:          I (the Attending) was present when the images were obtained by the operator AND/OR I reviewed the images after they were obtained.  I have reviewed the interpretation by the operator, made any necessary edits and agree with the interpretation as currently documented.  Electronically signed by Shela Leff on Saturday, July 31, 2022 at 3:44 AM    X-ray Portable Chest    Result Date: 07/29/2022  EXAM: XR PORTABLE CHEST INDICATION: right IJ line placement TECHNIQUE: 1 view of the chest. COMPARISON: CT chest 3 hours prior FINDINGS: Medical Devices: Right internal jugular central venous catheter tip projects over the right atrium. Heart and Mediastinum: Cardiomediastinal silhouette is within normal limits. Lungs and Pleura: Postsurgical changes in the right lung related to right upper lobectomy and right sided wedge resections better evaluated on recent CT. Hypoexpanded lungs with bibasilar parenchymal opacities, likely atelectasis. Additional lung findings are better characterized on recent CT. No pleural effusions or evidence for pneumothorax. Bones and soft tissues: No acute abnormalities.     IMPRESSION: Right internal jugular central venous catheter tip projects over the right atrium. Approved by Vira Blanco, MD on 08/18/2022 12:16 AM EDT I  have personally reviewed the images and I agree with this report. Report Verified by: Charlean Sanfilippo, MD at 08/13/2022 12:27 AM EDT    CT Abdomen and Pelvis With IV contrast    Result Date: 08/14/2022  EXAM: CT CHEST WITH CONTRAST EXAM: CT ABDOMEN AND PELVIS WITH CONTRAST INDICATION: Respiratory illness, nondiagnostic xray COMPARISON: None. TECHNIQUE: Helically acquired CT images from the lung apices through the pelvis was obtained in the supine position after the administration of IV contrast. Additional MIP images of the chest were performed. CONTRAST: 100 mL of IOHEXOL 350 MG IODINE/ML INTRAVENOUS SOLUTION administered intravenously FIELD OF VIEW: 40 cm.  FINDINGS: CHEST: LOWER NECK: Subcentimeter bilateral thyroid nodules, likely benign. HEART: The heart is normal in size. Severe aortic valvular calcifications. Moderate coronary artery calcifications. Mitral annular calcifications. THORACIC AORTA: Normal caliber and contour. Extensive calcified atherosclerotic plaque noted throughout the aorta and its branches with eccentric plaque in the proximal arch. PULMONARY ARTERIES: Normal in caliber. MEDIASTINUM AND HILA: No pathologically enlarged lymph nodes are identified. Patulous esophagus with retained contents. CHEST WALL AND AXILLA: Unremarkable. PLEURA: No pleural effusions or pneumothorax. AIRWAYS & LUNGS: The central airways are patent. Partial right lung resections, suspect right upper lobectomy and right middle lobe wedge resection, with surrounding parenchymal bands and architectural distortion. Irregular 1.6 cm opacity along the lateral margin of the right middle lobe wedge resection (series 304 image 127). Anterior and posterior right lower lobe tree-in-bud nodularity. Mild bibasilar bandlike opacities, likely atelectasis or scarring. OSSEOUS STRUCTURES: Numerous lucent lesions for example within the lateral right eighth rib. Mild multilevel degenerative disc disease. ABDOMEN/PELVIS: LIVER: Multifocal large  areas of heterogeneous and ill-defined right hepatic lobe hypoenhancement. Additional scattered subcentimeter hypodense lesions. BILIARY TREE: No intra or extra-hepatic biliary ductal dilatation. The gallbladder is unremarkable. SPLEEN: Not enlarged. Patchy splenic hypoattenuation including a linear superior splenic focus measuring 2 cm (series 305 image 28). PANCREAS: Unremarkable. ADRENAL GLANDS: Unremarkable. KIDNEYS/URETERS/BLADDER: Multiple heterogeneously enhancing bilateral renal lesions, a partially cystic right interpolar lesion measuring 2.2 x 1.9 cm (series 305 image 36) and left interpolar lesion measuring 1.6 x 1.3 cm (series 305 image 49) are representative  examples. Patchy renal cortical hypoenhancement bilaterally. No hydronephrosis. Urinary bladder is decompressed limiting evaluation. GASTROINTESTINAL TRACT: Wall thickening of the transverse colon to the rectum with wall hypoenhancement. The appendix is not visualized. Large colonic stool burden with 7 cm rectal stool ball. LYMPHATICS: No abdominal or pelvic lymph nodes are enlarged by size criteria. VASCULATURE: Tortuosity of the abdominal aorta with extensive calcified and noncalcified atherosclerotic plaque. 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 Eccentric noncalcified filling defect in the infrarenal abdominal aorta (series 602 image 58). IMA is not well-visualized. PERITONEUM: Patchy ill-defined mesenteric and omental attenuation, for example in the left upper quadrant on series 305 image 58. Trace ascites. No free air or loculated fluid collections. ABDOMINAL WALL/SOFT TISSUES: Unremarkable. GENITAL STRUCTURES: Prostatomegaly. 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 Adjacent round hyperdensity measuring 9 mm (series 305 image 107). OSSEOUS STRUCTURES: Multilevel degenerative disc disease and facet  arthrosis. Numerous osteolytic lesions in the thoracolumbar spine and bilateral pelvis, for example 2 cm right posterior iliac lesion (series 305 image 85).     IMPRESSION: Chest 1.  Right lower lobe tree-in-bud nodularity, likely infectious or inflammatory including aspiration. 2.  Right upper lobectomy and right middle lobe wedge resection with irregular opacity along the lateral margin of the right middle lobe/resection. Favor postoperative however recommend correlation with outside prior imaging. 3.  Findings suggestive of gastroesophageal reflux. 4.  Scattered lucent osseous lesions concerning for metastases given abdominal findings. Abdomen and Pelvis 1.  Nonenhancement of the thickened colonic wall extending from the mid transverse colon to the rectum, highly concerning for bowel ischemia. Extensive atherosclerosis with eccentric noncalcified infrarenal abdominal aorta filling defect which could represent mural thrombus and a potential source. 2.  Multiple heterogeneously enhancing bilateral renal lesions concerning for malignancy with differential to include primary renal neoplasms versus multiple metastases. 3.  Multifocal large areas of ill-defined right hepatic lobe hypoenhancement with additional subcentimeter hypodense lesions. Given additional findings in the abdomen and pelvis, these are suspicious for hepatic metastases. 4.  Patchy mesenteric and omental attenuation including a nondependent portions of the abdomen raise concern for peritoneal carcinomatosis versus patchy mesenteric edema related to the bowel ischemia. 5.  Numerous thoracolumbar spine and bilateral pelvic osteolytic lesions suspicious for osseous metastases. 6.  Patchy splenic hypoattenuation is most likely related to contrast phase timing. Metastases considered less likely. 7.  Findings discussed above concerning for multifocal malignancy should be compared to the outside prior imaging. 8.  Patchy renal cortical hypoenhancement  bilaterally may relate to ATN or infarcts. 9.  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 10.  Right inguinal canal soft tissue density, suspect retractile testicle. Associated 9 mm hyperdensity could represent a testicular lesion. Recommend correlation with sonographic evaluation. 11.  Large colonic stool burden with 7 cm rectal stool ball. CRITICAL RESULT: Bowel ischemia.  This finding was discussed with Shela Leff, MD on  07/29/2022 10:27 PM EDT by telephone. They confirmed that they understood the findings communicated to them.  #[id number]# Approved by Inda Merlin, MD on 07/21/2022 10:28 PM EDT I have personally reviewed the images and I agree with this report. Report Verified by: Melodye Ped, MD at 08/09/2022 10:39 PM EDT    CT Chest With IV contrast    Result Date: 08/05/2022  EXAM: CT CHEST WITH CONTRAST EXAM: CT ABDOMEN AND PELVIS WITH CONTRAST INDICATION: Respiratory illness, nondiagnostic  xray COMPARISON: None. TECHNIQUE: Helically acquired CT images from the lung apices through the pelvis was obtained in the supine position after the administration of IV contrast. Additional MIP images of the chest were performed. CONTRAST: 100 mL of IOHEXOL 350 MG IODINE/ML INTRAVENOUS SOLUTION administered intravenously FIELD OF VIEW: 40 cm.  FINDINGS: CHEST: LOWER NECK: Subcentimeter bilateral thyroid nodules, likely benign. HEART: The heart is normal in size. Severe aortic valvular calcifications. Moderate coronary artery calcifications. Mitral annular calcifications. THORACIC AORTA: Normal caliber and contour. Extensive calcified atherosclerotic plaque noted throughout the aorta and its branches with eccentric plaque in the proximal arch. PULMONARY ARTERIES: Normal in caliber. MEDIASTINUM AND HILA: No pathologically enlarged lymph nodes are identified. Patulous esophagus with retained contents. CHEST WALL AND AXILLA: Unremarkable. PLEURA: No  pleural effusions or pneumothorax. AIRWAYS & LUNGS: The central airways are patent. Partial right lung resections, suspect right upper lobectomy and right middle lobe wedge resection, with surrounding parenchymal bands and architectural distortion. Irregular 1.6 cm opacity along the lateral margin of the right middle lobe wedge resection (series 304 image 127). Anterior and posterior right lower lobe tree-in-bud nodularity. Mild bibasilar bandlike opacities, likely atelectasis or scarring. OSSEOUS STRUCTURES: Numerous lucent lesions for example within the lateral right eighth rib. Mild multilevel degenerative disc disease. ABDOMEN/PELVIS: LIVER: Multifocal large areas of heterogeneous and ill-defined right hepatic lobe hypoenhancement. Additional scattered subcentimeter hypodense lesions. BILIARY TREE: No intra or extra-hepatic biliary ductal dilatation. The gallbladder is unremarkable. SPLEEN: Not enlarged. Patchy splenic hypoattenuation including a linear superior splenic focus measuring 2 cm (series 305 image 28). PANCREAS: Unremarkable. ADRENAL GLANDS: Unremarkable. KIDNEYS/URETERS/BLADDER: Multiple heterogeneously enhancing bilateral renal lesions, a partially cystic right interpolar lesion measuring 2.2 x 1.9 cm (series 305 image 36) and left interpolar lesion measuring 1.6 x 1.3 cm (series 305 image 49) are representative examples. Patchy renal cortical hypoenhancement bilaterally. No hydronephrosis. Urinary bladder is decompressed limiting evaluation. GASTROINTESTINAL TRACT: Wall thickening of the transverse colon to the rectum with wall hypoenhancement. The appendix is not visualized. Large colonic stool burden with 7 cm rectal stool ball. LYMPHATICS: No abdominal or pelvic lymph nodes are enlarged by size criteria. VASCULATURE: Tortuosity of the abdominal aorta with extensive calcified and noncalcified atherosclerotic plaque. 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 Eccentric noncalcified filling defect in the infrarenal abdominal aorta (series 602 image 58). IMA is not well-visualized. PERITONEUM: Patchy ill-defined mesenteric and omental attenuation, for example in the left upper quadrant on series 305 image 58. Trace ascites. No free air or loculated fluid collections. ABDOMINAL WALL/SOFT TISSUES: Unremarkable. GENITAL STRUCTURES: Prostatomegaly. 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 Adjacent round hyperdensity measuring 9 mm (series 305 image 107). 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 Numerous osteolytic lesions in the thoracolumbar spine and bilateral pelvis, for example 2 cm right posterior iliac lesion (series 305 image 85).     IMPRESSION: Chest 1.  Right lower lobe tree-in-bud nodularity, likely infectious or inflammatory including aspiration. 2.  Right upper lobectomy and right middle lobe wedge resection with irregular opacity along the lateral margin of the right middle lobe/resection. Favor postoperative however recommend correlation with outside prior imaging. 3.  Findings suggestive of gastroesophageal reflux. 4.  Scattered lucent osseous lesions concerning for metastases given abdominal findings. Abdomen and Pelvis 1.  Nonenhancement of the thickened colonic wall extending from the mid transverse colon to the rectum, highly concerning for bowel ischemia. Extensive  atherosclerosis with eccentric noncalcified infrarenal abdominal aorta filling defect which could represent mural thrombus and a potential source. 2.  Multiple heterogeneously enhancing bilateral renal lesions concerning for malignancy with differential to include primary renal neoplasms versus multiple metastases. 3.  Multifocal large areas of ill-defined right hepatic lobe hypoenhancement with additional subcentimeter hypodense lesions. Given additional  findings in the abdomen and pelvis, these are suspicious for hepatic metastases. 4.  Patchy mesenteric and omental attenuation including a nondependent portions of the abdomen raise concern for peritoneal carcinomatosis versus patchy mesenteric edema related to the bowel ischemia. 5.  Numerous thoracolumbar spine and bilateral pelvic osteolytic lesions suspicious for osseous metastases. 6.  Patchy splenic hypoattenuation is most likely related to contrast phase timing. Metastases considered less likely. 7.  Findings discussed above concerning for multifocal malignancy should be compared to the outside prior imaging. 8.  Patchy renal cortical hypoenhancement bilaterally may relate to ATN or infarcts. 9.  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 10.  Right inguinal canal soft tissue density, suspect retractile testicle. Associated 9 mm hyperdensity could represent a testicular lesion. Recommend correlation with sonographic evaluation. 11.  Large colonic stool burden with 7 cm rectal stool ball. CRITICAL RESULT: Bowel ischemia.  This finding was discussed with Shela Leff, MD on  08/01/2022 10:27 PM EDT by telephone. They confirmed that they understood the findings communicated to them.  #[id number]# Approved by Inda Merlin, MD on 08/09/2022 10:28 PM EDT I have personally reviewed the images and I agree with this report. Report Verified by: Melodye Ped, MD at 07/28/2022 10:39 PM EDT    X-ray Portable Chest    Result Date: 08/02/2022  EXAM: XR PORTABLE CHEST INDICATION: hypotension TECHNIQUE: 1 view of the chest. COMPARISON: None. FINDINGS: Medical Devices: None. Heart and Mediastinum: Cardiac size is within normal limits. Calcified atherosclerosis at the aortic knob. Lungs and Pleura: Hypoexpanded lungs with mild bibasilar parenchymal opacities, likely atelectasis. Bones and soft tissues: No acute abnormalities.     IMPRESSION: No acute  cardiopulmonary abnormality. Report Verified by: Melodye Ped, MD at 08/11/2022 8:21 PM EDT       ASSESSMENT & PLAN     Patient will be admitted to the hospital for/with the following problems:    Profound Acute shock - septic versus vascular collapse due to widespread vascular ischemia - pressors, ICU, critical care consulted.    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  Consulted nephrology.    Ischemic bowel - continue iv cefepime and vancomycin, and flagyl as well as pain control.  Now on pressors.  Will defer to critical care management of pressors.    DNRcc-A code status clarified.      Malignancy - multifocal (lung, renal, hepatic, mesenteric/omental, bone).  Not a surgical candidate at this time due to chronic illness and acute sepsis.      Pneumonia - right tree in bud nodularity on ct likely infectious.  On cefepime, vanc, stress dose steroid.      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  Not surgical candidate.      Large colonic stool burden - 7cm rectal stool ball.  Ordered soap suds enemas if patient is able to tolerate.  However, with widespread cancer and vascular disease may consider just palliative care.      Proph - ppi, heparin    Consult palliative care.      I believe that patient requires inpatient services and is  anticipated to require >2 midnight stay in the hospital:  Comorbidities: esrd on hd  Current medical needs:icu, pressors, iv antibiotics  Risk of adverse events:death  In my medical opinion patient is not safe for discharge from the hospital/outpatient care.      I personally reviewed the patient's medical record, labs, radiology reports and discussed the patient's case with the ER provider submitting the patient for admission.  Admission was discussed with the patient in detail; all questions answered and patient agrees with plan.    Electronically signed,  Johann Capers  07/22/2022  7:58 AM  [facility]

## 2022-07-31 NOTE — Nursing Note (Addendum)
Patient's spo2 and temperature not accurately reading despite much effort and site changes, but patient remains on 2L nasal cannula and bair hugger not appearing to be in any discomfort/distress. Dr. Joyce Gross made aware by this RN.

## 2022-07-31 NOTE — Significant Event (Signed)
1446: Patient with sudden worsening hypotension requiring increased pressor support at this time. Levophed increased up to 6 mcg/min (see eMAR). Patient also with ongoing mental deterioration, now lethargic. Dr. Joyce Gross made aware of situation by this RN.

## 2022-07-31 NOTE — Plan of Care (Signed)
Patient with progressive hypotension, bradycardia, and deterioration in mental status.  Daughter and son at bedside.  Decision was made to transition patient to comfort care.  Comfort care order set placed.    Code status changed to [facility]

## 2022-08-19 DEATH — deceased
